# Patient Record
Sex: Female | Born: 1940 | Race: White | Hispanic: No
Health system: Southern US, Community
[De-identification: ages and names within clinical notes are randomized; demographics above are authoritative.]

## PROBLEM LIST (undated history)

## (undated) DIAGNOSIS — I82409 Acute embolism and thrombosis of unspecified deep veins of unspecified lower extremity: Secondary | ICD-10-CM

## (undated) DIAGNOSIS — K219 Gastro-esophageal reflux disease without esophagitis: Secondary | ICD-10-CM

## (undated) DIAGNOSIS — F32A Depression, unspecified: Secondary | ICD-10-CM

## (undated) DIAGNOSIS — R413 Other amnesia: Secondary | ICD-10-CM

## (undated) DIAGNOSIS — R0600 Dyspnea, unspecified: Secondary | ICD-10-CM

## (undated) DIAGNOSIS — I4891 Unspecified atrial fibrillation: Secondary | ICD-10-CM

## (undated) DIAGNOSIS — Z8679 Personal history of other diseases of the circulatory system: Secondary | ICD-10-CM

## (undated) DIAGNOSIS — K08109 Complete loss of teeth, unspecified cause, unspecified class: Secondary | ICD-10-CM

## (undated) DIAGNOSIS — E041 Nontoxic single thyroid nodule: Secondary | ICD-10-CM

## (undated) DIAGNOSIS — E7800 Pure hypercholesterolemia, unspecified: Secondary | ICD-10-CM

## (undated) DIAGNOSIS — S3012XA Contusion of groin, initial encounter: Secondary | ICD-10-CM

## (undated) DIAGNOSIS — S301XXA Contusion of abdominal wall, initial encounter: Secondary | ICD-10-CM

## (undated) DIAGNOSIS — Z9889 Other specified postprocedural states: Secondary | ICD-10-CM

## (undated) DIAGNOSIS — Z974 Presence of external hearing-aid: Secondary | ICD-10-CM

## (undated) DIAGNOSIS — M1990 Unspecified osteoarthritis, unspecified site: Secondary | ICD-10-CM

## (undated) DIAGNOSIS — Z8673 Personal history of transient ischemic attack (TIA), and cerebral infarction without residual deficits: Secondary | ICD-10-CM

## (undated) DIAGNOSIS — G3184 Mild cognitive impairment, so stated: Secondary | ICD-10-CM

## (undated) DIAGNOSIS — D62 Acute posthemorrhagic anemia: Secondary | ICD-10-CM

## (undated) DIAGNOSIS — I639 Cerebral infarction, unspecified: Secondary | ICD-10-CM

## (undated) DIAGNOSIS — H269 Unspecified cataract: Secondary | ICD-10-CM

## (undated) DIAGNOSIS — I480 Paroxysmal atrial fibrillation: Secondary | ICD-10-CM

## (undated) DIAGNOSIS — N183 Chronic kidney disease, stage 3 unspecified: Secondary | ICD-10-CM

## (undated) DIAGNOSIS — F0390 Unspecified dementia without behavioral disturbance: Secondary | ICD-10-CM

## (undated) DIAGNOSIS — R0602 Shortness of breath: Secondary | ICD-10-CM

## (undated) DIAGNOSIS — I6602 Occlusion and stenosis of left middle cerebral artery: Secondary | ICD-10-CM

## (undated) DIAGNOSIS — I491 Atrial premature depolarization: Secondary | ICD-10-CM

## (undated) DIAGNOSIS — C50919 Malignant neoplasm of unspecified site of unspecified female breast: Secondary | ICD-10-CM

## (undated) DIAGNOSIS — Z952 Presence of prosthetic heart valve: Principal | ICD-10-CM

## (undated) DIAGNOSIS — F329 Major depressive disorder, single episode, unspecified: Secondary | ICD-10-CM

## (undated) DIAGNOSIS — C801 Malignant (primary) neoplasm, unspecified: Secondary | ICD-10-CM

## (undated) DIAGNOSIS — F419 Anxiety disorder, unspecified: Secondary | ICD-10-CM

## (undated) DIAGNOSIS — I129 Hypertensive chronic kidney disease with stage 1 through stage 4 chronic kidney disease, or unspecified chronic kidney disease: Secondary | ICD-10-CM

## (undated) DIAGNOSIS — I499 Cardiac arrhythmia, unspecified: Secondary | ICD-10-CM

## (undated) DIAGNOSIS — Z973 Presence of spectacles and contact lenses: Secondary | ICD-10-CM

## (undated) DIAGNOSIS — J189 Pneumonia, unspecified organism: Secondary | ICD-10-CM

## (undated) DIAGNOSIS — I10 Essential (primary) hypertension: Secondary | ICD-10-CM

## (undated) DIAGNOSIS — Z972 Presence of dental prosthetic device (complete) (partial): Secondary | ICD-10-CM

## (undated) DIAGNOSIS — I2510 Atherosclerotic heart disease of native coronary artery without angina pectoris: Secondary | ICD-10-CM

## (undated) DIAGNOSIS — I071 Rheumatic tricuspid insufficiency: Secondary | ICD-10-CM

## (undated) DIAGNOSIS — I340 Nonrheumatic mitral (valve) insufficiency: Secondary | ICD-10-CM

## (undated) DIAGNOSIS — R112 Nausea with vomiting, unspecified: Secondary | ICD-10-CM

## (undated) HISTORY — DX: Acute embolism and thrombosis of unspecified deep veins of unspecified lower extremity: I82.409

## (undated) HISTORY — DX: Atrial premature depolarization: I49.1

## (undated) HISTORY — DX: Gastro-esophageal reflux disease without esophagitis: K21.9

## (undated) HISTORY — DX: Rheumatic tricuspid insufficiency: I07.1

## (undated) HISTORY — DX: Nonrheumatic mitral (valve) insufficiency: I34.0

## (undated) HISTORY — PX: CATARACT EXTRACTION W/ INTRAOCULAR LENS  IMPLANT, BILATERAL: SHX1307

## (undated) HISTORY — DX: Paroxysmal atrial fibrillation: I48.0

## (undated) HISTORY — PX: DILATION AND CURETTAGE OF UTERUS: SHX78

## (undated) HISTORY — DX: Unspecified cataract: H26.9

## (undated) HISTORY — DX: Unspecified atrial fibrillation: I48.91

## (undated) HISTORY — DX: Pure hypercholesterolemia, unspecified: E78.00

## (undated) HISTORY — DX: Chronic kidney disease, stage 3 unspecified: N18.30

## (undated) HISTORY — DX: Mild cognitive impairment of uncertain or unknown etiology: G31.84

## (undated) HISTORY — DX: Cerebral infarction, unspecified: I63.9

## (undated) HISTORY — DX: Contusion of groin, initial encounter: S30.12XA

## (undated) HISTORY — PX: TUBAL LIGATION: SHX77

## (undated) HISTORY — DX: Atherosclerotic heart disease of native coronary artery without angina pectoris: I25.10

## (undated) HISTORY — DX: Hypertensive chronic kidney disease with stage 1 through stage 4 chronic kidney disease, or unspecified chronic kidney disease: I12.9

## (undated) HISTORY — DX: Other amnesia: R41.3

## (undated) HISTORY — DX: Contusion of abdominal wall, initial encounter: S30.1XXA

## (undated) HISTORY — PX: MULTIPLE TOOTH EXTRACTIONS: SHX2053

---
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
## 1980-03-28 HISTORY — PX: MASTECTOMY: SHX3

## 1999-05-03 ENCOUNTER — Other Ambulatory Visit: Admission: RE | Admit: 1999-05-03 | Discharge: 1999-05-03 | Payer: Self-pay | Admitting: *Deleted

## 2000-07-21 ENCOUNTER — Encounter: Payer: Self-pay | Admitting: Emergency Medicine

## 2000-07-21 ENCOUNTER — Emergency Department (HOSPITAL_COMMUNITY): Admission: EM | Admit: 2000-07-21 | Discharge: 2000-07-22 | Payer: Self-pay | Admitting: Emergency Medicine

## 2000-11-06 ENCOUNTER — Other Ambulatory Visit: Admission: RE | Admit: 2000-11-06 | Discharge: 2000-11-06 | Payer: Self-pay | Admitting: *Deleted

## 2001-01-15 ENCOUNTER — Encounter (INDEPENDENT_AMBULATORY_CARE_PROVIDER_SITE_OTHER): Payer: Self-pay

## 2001-01-15 ENCOUNTER — Ambulatory Visit (HOSPITAL_COMMUNITY): Admission: RE | Admit: 2001-01-15 | Discharge: 2001-01-15 | Payer: Self-pay | Admitting: *Deleted

## 2005-02-27 ENCOUNTER — Emergency Department (HOSPITAL_COMMUNITY): Admission: EM | Admit: 2005-02-27 | Discharge: 2005-02-27 | Payer: Self-pay | Admitting: Emergency Medicine

## 2007-05-18 ENCOUNTER — Encounter (INDEPENDENT_AMBULATORY_CARE_PROVIDER_SITE_OTHER): Payer: Self-pay | Admitting: Internal Medicine

## 2007-05-18 ENCOUNTER — Ambulatory Visit: Admission: RE | Admit: 2007-05-18 | Discharge: 2007-05-18 | Payer: Self-pay | Admitting: Internal Medicine

## 2007-05-18 ENCOUNTER — Ambulatory Visit: Payer: Self-pay | Admitting: Surgery

## 2010-08-13 NOTE — Procedures (Signed)
[facility]  Patient:    Rachel Santos, Rachel Santos Visit Number: [id number] MRN: [id number]          Service Type: END Location: ENDO Attending Physician:  Sabino Gasser Proc. Date: 01/15/01 Admit Date:  01/15/2001                             Procedure Report  PROCEDURE:  Upper endoscopy.  INDICATION:  Gastroesophageal reflux disease.  ANESTHESIA:  Demerol 50, Versed 5 mg.  DESCRIPTION OF PROCEDURE:  With patient mildly sedated in the left lateral decubitus position, the Olympus videoscopic endoscope was inserted into the mouth and passed under direct vision through the esophagus.  The distal esophagus appeared to be somewhat inflamed, photographed and biopsied.  We entered into the stomach.  The fundus, body, antrum, duodenal bulb, and second portion of duodenum all appeared normal.  From this point, the endoscope was slowly withdrawn, taking circumferential views of the entire duodenal mucosa until the endoscope then pulled back into the stomach and placed in retroflexion to view the stomach from below.  The endoscope was then straightened and withdrawn, taking circumferential views of the remaining gastric and esophageal mucosa.  The patients vital signs and pulse oximeter remained stable.  The patient tolerated the procedure well without apparent complications.  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  Await biopsy report.  The patient will call me for results and follow up with me as an outpatient. Proceed to colonoscopy. Attending Physician:  Sabino Gasser DD:  01/15/01 TD:  01/16/01 Job: [id number]

## 2010-08-13 NOTE — Procedures (Signed)
[facility]  Patient:    Rachel Santos, Rachel Santos Visit Number: [id number] MRN: [id number]          Service Type: END Location: ENDO Attending Physician:  Sabino Gasser Proc. Date: 01/15/01 Admit Date:  01/15/2001                             Procedure Report  PROCEDURE:  Colonoscopy.  ANESTHESIA:  Demerol 20, Versed 2 mg.  INDICATIONS:  Ulcerative colitis.  DESCRIPTION OF PROCEDURE:  With the patient mildly sedated in the left lateral decubitus position, the Olympus videoscopic colonoscope was inserted into the rectum and passed under direct vision to the cecum, identified by the ileocecal valve and appendiceal orifice.  We entered into the terminal ileum which also appeared normal.  From this point, the endoscope was slowly withdrawn, taking circumferential views of the entire colonic mucosa, stopping in the rectum which appeared normal on direct view and showed hemorrhoids on retroflex view.  The endoscope was straightened and withdrawn.  The patients vital signs and pulse oximeter remained stable.  The patient tolerated the procedure well without apparent complications.  FINDINGS:  Internal hemorrhoids, otherwise unremarkable colonoscopic examination to the cecum, other than the hemorrhoids.  PLAN:  Follow up with me.  See endoscopy note for further details. Attending Physician:  Sabino Gasser DD:  01/15/01 TD:  01/16/01 Job: [id number]

## 2011-08-09 ENCOUNTER — Encounter (HOSPITAL_COMMUNITY): Payer: Self-pay | Admitting: *Deleted

## 2011-08-09 ENCOUNTER — Emergency Department (HOSPITAL_COMMUNITY): Payer: No Typology Code available for payment source

## 2011-08-09 ENCOUNTER — Emergency Department (HOSPITAL_COMMUNITY)
Admission: EM | Admit: 2011-08-09 | Discharge: 2011-08-09 | Disposition: A | Discharge status: Home Health | Payer: No Typology Code available for payment source | Attending: Emergency Medicine | Admitting: Emergency Medicine

## 2011-08-09 DIAGNOSIS — M129 Arthropathy, unspecified: Secondary | ICD-10-CM | POA: Insufficient documentation

## 2011-08-09 DIAGNOSIS — E119 Type 2 diabetes mellitus without complications: Secondary | ICD-10-CM | POA: Insufficient documentation

## 2011-08-09 DIAGNOSIS — I10 Essential (primary) hypertension: Secondary | ICD-10-CM | POA: Insufficient documentation

## 2011-08-09 DIAGNOSIS — R51 Headache: Secondary | ICD-10-CM | POA: Insufficient documentation

## 2011-08-09 DIAGNOSIS — F29 Unspecified psychosis not due to a substance or known physiological condition: Secondary | ICD-10-CM | POA: Insufficient documentation

## 2011-08-09 HISTORY — DX: Unspecified osteoarthritis, unspecified site: M19.90

## 2011-08-09 HISTORY — DX: Essential (primary) hypertension: I10

## 2011-08-09 LAB — GLUCOSE, CAPILLARY: Glucose-Capillary: 141 mg/dL — ABNORMAL HIGH (ref 70–99)
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
## 2012-06-28 ENCOUNTER — Other Ambulatory Visit (HOSPITAL_COMMUNITY): Payer: Self-pay | Admitting: Internal Medicine

## 2012-06-28 DIAGNOSIS — I4891 Unspecified atrial fibrillation: Secondary | ICD-10-CM

## 2012-06-29 ENCOUNTER — Ambulatory Visit (HOSPITAL_COMMUNITY): Payer: Medicare Other | Attending: Cardiology

## 2012-06-29 DIAGNOSIS — G459 Transient cerebral ischemic attack, unspecified: Secondary | ICD-10-CM

## 2012-06-29 DIAGNOSIS — Z8673 Personal history of transient ischemic attack (TIA), and cerebral infarction without residual deficits: Secondary | ICD-10-CM | POA: Insufficient documentation

## 2012-06-29 DIAGNOSIS — I4891 Unspecified atrial fibrillation: Secondary | ICD-10-CM

## 2012-06-29 NOTE — Progress Notes (Signed)
Echocardiogram performed.  

## 2012-07-02 ENCOUNTER — Other Ambulatory Visit (HOSPITAL_COMMUNITY): Payer: Medicare Other

## 2012-07-02 ENCOUNTER — Encounter: Payer: Self-pay | Admitting: Cardiovascular Disease

## 2012-07-02 ENCOUNTER — Encounter (HOSPITAL_COMMUNITY): Payer: Self-pay | Admitting: Internal Medicine

## 2012-07-03 ENCOUNTER — Encounter: Payer: Self-pay | Admitting: Cardiovascular Disease

## 2012-07-03 ENCOUNTER — Ambulatory Visit (INDEPENDENT_AMBULATORY_CARE_PROVIDER_SITE_OTHER): Payer: Medicare Other | Admitting: Cardiovascular Disease

## 2012-07-03 VITALS — BP 140/80 | HR 79 | Ht 66.0 in | Wt 174.0 lb

## 2012-07-03 DIAGNOSIS — I340 Nonrheumatic mitral (valve) insufficiency: Secondary | ICD-10-CM | POA: Insufficient documentation

## 2012-07-03 DIAGNOSIS — I059 Rheumatic mitral valve disease, unspecified: Secondary | ICD-10-CM

## 2012-07-03 DIAGNOSIS — I480 Paroxysmal atrial fibrillation: Secondary | ICD-10-CM | POA: Insufficient documentation

## 2012-07-03 DIAGNOSIS — I4891 Unspecified atrial fibrillation: Secondary | ICD-10-CM

## 2012-07-03 NOTE — Patient Instructions (Addendum)
You have a follow up appt scheduled with Dr. Clifton James on July 17, 2012 at 2:00

## 2012-07-03 NOTE — Progress Notes (Signed)
History of Present Illness: 72 yo female with history of DM, HTN, OA, atrial fibrillation, GERD, cataracts,  who is here today as a new patient for evaluation of atrial fibrillation and severe MR by echo yesterday. She was diagnosed with atrial fibrillation on 06/27/12 in primary care and was started on Metoprolol 50 mg po BID for rate control and Xarelto 20 mg po Qdaily. Metoprolol has been reduced to 25 mg po BID. Echo yesterday with normal LV size and function, LVEF   Primary Care Physician: Dr. Nicholos [facility])  Past Medical History  Diagnosis Date  . Diabetes mellitus   . Arthritis   . Hypertension   . Atrial fibrillation   . Mitral regurgitation   . GERD (gastroesophageal reflux disease)   . Cataracts, bilateral     Past Surgical History  Procedure Laterality Date  . Mastectomy Left 1982  . Cesarean section      x 2    Current Outpatient Prescriptions  Medication Sig Dispense Refill  . calcium carbonate 200 MG capsule Calcium citrate  /vitamin D 630mg  (calicum)  1 tab daily      . glipiZIDE (GLUCOTROL XL) 10 MG 24 hr tablet 1 tab daily      . Lancets 28G MISC Lancets and test strips      . LEVEMIR FLEXPEN 100 UNIT/ML injection As directed      . losartan (COZAAR) 50 MG tablet 1 tab daily      . lovastatin (MEVACOR) 40 MG tablet 1 tab daily      . meloxicam (MOBIC) 7.5 MG tablet Take 1 tab daily      . metFORMIN (GLUCOPHAGE) 500 MG tablet Take 2 tab twice a day      . metoprolol (LOPRESSOR) 50 MG tablet Take 1/2 tab bid      . Multiple Vitamin (MULTIVITAMIN) tablet Take 1 tablet by mouth daily.      Marland Kitchen omeprazole (PRILOSEC) 40 MG capsule 1 tab daily      . PARoxetine (PAXIL) 30 MG tablet 1 tab daily      . Rivaroxaban (XARELTO) 20 MG TABS Take by mouth daily.       No current facility-administered medications for this visit.    No Known Allergies  History   Social History  . Marital Status: Widowed    Spouse Name: N/A    Number of  Children: 3  . Years of Education: N/A   Occupational History  . Retired-[employer]    Social History Main Topics  . Smoking status: Never Smoker   . Smokeless tobacco: Not on file  . Alcohol Use: No  . Drug Use: No  . Sexually Active: No   Other Topics Concern  . Not on file   Social History Narrative  . No narrative on file    Family History  Problem Relation Age of Onset  . Cancer Father   . Heart attack Paternal Grandfather     Review of Systems:  As stated in the HPI and otherwise negative.   BP 140/80  Pulse 79  Ht 5\' 6"  (1.676 m)  Wt 174 lb (78.926 kg)  BMI 28.1 kg/m2  Physical Examination: General: Well developed, well nourished, NAD HEENT: OP clear, mucus membranes moist SKIN: warm, dry. No rashes. Neuro: No focal deficits Musculoskeletal: Muscle strength 5/5 all ext Psychiatric: Mood and affect normal Neck: No JVD, no carotid bruits, no thyromegaly, no lymphadenopathy. Lungs:Clear bilaterally, no wheezes, rhonci, crackles Cardiovascular: Irregular. Systolic  murmur. No gallops or rubs. Abdomen:Soft. Bowel sounds present. Non-tender.  Extremities: No lower extremity edema. Pulses are 2 + in the bilateral DP/PT.  EKG: Atrial fibrillation, rate 79 bpm.   Echo 07/02/12: Left ventricle: The cavity size was normal. Wall thickness was increased in a pattern of mild LVH. Systolic function was normal. The estimated ejection fraction was in the range of 55% to 60%. Wall motion was normal; there were no regional wall motion abnormalities. - Mitral valve: Calcified annulus. Severe regurgitation. - Left atrium: The atrium was moderately dilated. - Right atrium: The atrium was mildly dilated. - Tricuspid valve: Moderate regurgitation. - Pulmonary arteries: Systolic pressure was mildly increased. PA peak pressure: 40mm Hg (S).  Assessment and Plan:   1. Atrial fibrillation: Rate is controlled. Will continue Metoprolol 25 mg po BID. Will  continue Xarelto 20 mg po Qdaily for anti-coagulation. She has some fatigue but this is better with reduced dose of metoprolol. Will need 3 more weeks of anti-coagulation then I will see her back and discuss DCCV. She may not be able to maintain NSR with biatrial enlargement.   2. Severe Mitral valve regurgitation: Severe by echo yesterday. Her LV size is normal. Some of her fatigue may be related to this but hard to say with her new atrial fibrillation. If we can get her back in sinus but her fatigue persists, will need to consider cath/TEE and surgical referral.

## 2012-07-05 ENCOUNTER — Encounter (INDEPENDENT_AMBULATORY_CARE_PROVIDER_SITE_OTHER): Payer: Medicare Other

## 2012-07-05 DIAGNOSIS — H53129 Transient visual loss, unspecified eye: Secondary | ICD-10-CM

## 2012-07-05 DIAGNOSIS — I6529 Occlusion and stenosis of unspecified carotid artery: Secondary | ICD-10-CM

## 2012-07-05 DIAGNOSIS — H53123 Transient visual loss, bilateral: Secondary | ICD-10-CM

## 2012-07-10 ENCOUNTER — Telehealth: Payer: Self-pay | Admitting: Cardiovascular Disease

## 2012-07-10 NOTE — Telephone Encounter (Signed)
Spoke with pt's daughter and appt made for pt to see Dr. Clifton James on Jul 26, 2012 at 11:30

## 2012-07-10 NOTE — Telephone Encounter (Signed)
New prble   Patient was schedule on 4/22 . However r.s to 5/28 . Daughter called back in to see if appt can be move up sooner.

## 2012-07-17 ENCOUNTER — Ambulatory Visit: Payer: Medicare Other | Admitting: Cardiovascular Disease

## 2012-07-26 ENCOUNTER — Ambulatory Visit (INDEPENDENT_AMBULATORY_CARE_PROVIDER_SITE_OTHER): Payer: Medicare Other | Admitting: Cardiovascular Disease

## 2012-07-26 ENCOUNTER — Encounter: Payer: Self-pay | Admitting: Cardiovascular Disease

## 2012-07-26 ENCOUNTER — Encounter: Payer: Self-pay | Admitting: *Deleted

## 2012-07-26 VITALS — BP 134/84 | HR 81 | Ht 66.0 in | Wt 172.8 lb

## 2012-07-26 DIAGNOSIS — I4891 Unspecified atrial fibrillation: Secondary | ICD-10-CM

## 2012-07-26 DIAGNOSIS — I059 Rheumatic mitral valve disease, unspecified: Secondary | ICD-10-CM

## 2012-07-26 DIAGNOSIS — I340 Nonrheumatic mitral (valve) insufficiency: Secondary | ICD-10-CM

## 2012-07-26 LAB — CBC WITH DIFFERENTIAL/PLATELET
Basophils Relative: 0.3 % (ref 0.0–3.0)
Hemoglobin: 11.8 g/dL — ABNORMAL LOW (ref 12.0–15.0)
Lymphocytes Relative: 32.5 % (ref 12.0–46.0)
MCHC: 33.4 g/dL (ref 30.0–36.0)
Monocytes Relative: 9.8 % (ref 3.0–12.0)
Neutro Abs: 4.4 10*3/uL (ref 1.4–7.7)
RBC: 4.14 Mil/uL (ref 3.87–5.11)

## 2012-07-26 LAB — BASIC METABOLIC PANEL
BUN: 24 mg/dL — ABNORMAL HIGH (ref 6–23)
CO2: 24 mEq/L (ref 19–32)
Chloride: 103 mEq/L (ref 96–112)
Creatinine, Ser: 0.9 mg/dL (ref 0.4–1.2)
Glucose, Bld: 166 mg/dL — ABNORMAL HIGH (ref 70–99)

## 2012-07-26 NOTE — Patient Instructions (Addendum)
Your physician recommends that you schedule a follow-up appointment in:  2 weeks.   Your physician has requested that you have a TEE/Cardioversion. During a TEE, sound waves are used to create images of your heart. It provides your doctor with information about the size and shape of your heart and how well your heart's chambers and valves are working. In this test, a transducer is attached to the end of a flexible tube that is guided down you throat and into your esophagus (the tube leading from your mouth to your stomach) to get a more detailed image of your heart. Once the TEE has determined that a blood clot is not present, the cardioversion begins. Electrical Cardioversion uses a jolt of electricity to your heart either through paddles or wired patches attached to your chest. This is a controlled, usually prescheduled, procedure. This procedure is done at the hospital and you are not awake during the procedure. You usually go home the day of the procedure. Please see the instruction sheet given to you today for more information. Scheduled for Jul 31, 2012.

## 2012-07-26 NOTE — Progress Notes (Addendum)
 History of Present Illness: 72 yo female with history of DM, HTN, OA, atrial fibrillation, GERD, cataracts, who is here today for cardiac follow up. I saw her as a new patient for evaluation of atrial fibrillation and severe MR on 07/03/12.  She was diagnosed with atrial fibrillation on 06/27/12 in primary care and was started on Metoprolol 50 mg po BID for rate control and Xarelto 20 mg po Qdaily.  Echo with normal LV size and function severe MR, moderate TR. We planned on 3 more weeks of anti-coagulation before consideration for a cardioversion.   She is here today for follow up. She has had some discomfort in her chest. No exertional chest pain. No dyspnea. She does note a cough. Non-productive. She is not aware of her heart racing.   Primary Care Physician: Dr. Ramachandran ([facility])  Past Medical History  Diagnosis Date  . Diabetes mellitus   . Arthritis   . Hypertension   . Atrial fibrillation   . Mitral regurgitation   . GERD (gastroesophageal reflux disease)   . Cataracts, bilateral     Past Surgical History  Procedure Laterality Date  . Mastectomy Left 1982  . Cesarean section      x 2    Current Outpatient Prescriptions  Medication Sig Dispense Refill  . calcium carbonate 200 MG capsule Calcium citrate  /vitamin D 630mg (calicum)  1 tab daily      . glipiZIDE (GLUCOTROL XL) 10 MG 24 hr tablet 1 tab daily      . Lancets 28G MISC Lancets and test strips      . LEVEMIR FLEXPEN 100 UNIT/ML injection As directed      . losartan (COZAAR) 50 MG tablet 1 tab daily      . lovastatin (MEVACOR) 40 MG tablet 1 tab daily      . meloxicam (MOBIC) 7.5 MG tablet Take 1 tab daily      . metFORMIN (GLUCOPHAGE) 500 MG tablet Take 2 tab twice a day      . metoprolol (LOPRESSOR) 50 MG tablet Take 1/2 tab bid      . Multiple Vitamin (MULTIVITAMIN) tablet Take 1 tablet by mouth daily.      . omeprazole (PRILOSEC) 40 MG capsule 1 tab daily      . PARoxetine (PAXIL) 30 MG  tablet 1 tab daily      . Rivaroxaban (XARELTO) 20 MG TABS Take by mouth daily.       No current facility-administered medications for this visit.    No Known Allergies  History   Social History  . Marital Status: Widowed    Spouse Name: N/A    Number of Children: 3  . Years of Education: N/A   Occupational History  . Retired-[employer]    Social History Main Topics  . Smoking status: Never Smoker   . Smokeless tobacco: Not on file  . Alcohol Use: No  . Drug Use: No  . Sexually Active: No   Other Topics Concern  . Not on file   Social History Narrative  . No narrative on file    Family History  Problem Relation Age of Onset  . Cancer Father   . Heart attack Paternal Grandfather     Review of Systems:  As stated in the HPI and otherwise negative.   BP 134/84  Pulse 81  Ht 5' 6" (1.676 m)  Wt 172 lb 12.8 oz (78.382 kg)  BMI 27.9 kg/m2  SpO2   98%  Physical Examination: General: Well developed, well nourished, NAD HEENT: OP clear, mucus membranes moist SKIN: warm, dry. No rashes. Neuro: No focal deficits Musculoskeletal: Muscle strength 5/5 all ext Psychiatric: Mood and affect normal Neck: No JVD, no carotid bruits, no thyromegaly, no lymphadenopathy. Lungs:Clear bilaterally, no wheezes, rhonci, crackles Cardiovascular: Regular rate and rhythm. No murmurs, gallops or rubs. Abdomen:Soft. Bowel sounds present. Non-tender.  Extremities: No lower extremity edema. Pulses are 2 + in the bilateral DP/PT.  EKG: Atrial fibrillation, rate 81 bpm. Septal infarct.   Echo 07/02/12:  Left ventricle: The cavity size was normal. Wall thickness was increased in a pattern of mild LVH. Systolic function was normal. The estimated ejection fraction was in the range of 55% to 60%. Wall motion was normal; there were no regional wall motion abnormalities. - Mitral valve: Calcified annulus. Severe regurgitation. - Left atrium: The atrium was moderately  dilated. - Right atrium: The atrium was mildly dilated. - Tricuspid valve: Moderate regurgitation. - Pulmonary arteries: Systolic pressure was mildly increased. PA peak pressure: 40mm Hg (S).  Assessment and Plan:   1. Atrial fibrillation: Rate is controlled. Will continue Metoprolol 25 mg po BID. Will continue Xarelto 20 mg po Qdaily for anti-coagulation. Will plan TEE guided cardioversion as she will need the TEE for assessment of her mitral valve to better evaluate mechanism of her severe MR.  She may not be able to maintain NSR with biatrial enlargement but will give DCCV a try. BMET and CBC today.   2. Severe Mitral valve regurgitation: Severe by echo April 2014. Her LV size and function is normal. Some of her fatigue may be related to this but hard to say with her new atrial fibrillation. Will plan TEE as above. Based on TEE findings, she may need consideration for MV surgery. Will delay right and left heart cath for one month after her cardioversion since we will need to hold her Xarelto for the cath.  

## 2012-07-26 NOTE — Addendum Note (Signed)
Addended by: Verne Carrow D on: 07/26/2012 01:42 PM   Modules accepted: Orders

## 2012-07-31 ENCOUNTER — Ambulatory Visit (HOSPITAL_COMMUNITY): Payer: Medicare Other | Admitting: *Deleted

## 2012-07-31 ENCOUNTER — Ambulatory Visit (HOSPITAL_COMMUNITY)
Admission: RE | Admit: 2012-07-31 | Discharge: 2012-07-31 | Disposition: A | Discharge status: Home / Self Care | Payer: Medicare Other | Source: Ambulatory Visit | Attending: Cardiovascular Disease | Admitting: Cardiovascular Disease

## 2012-07-31 ENCOUNTER — Encounter (HOSPITAL_COMMUNITY): Payer: Self-pay | Admitting: *Deleted

## 2012-07-31 ENCOUNTER — Encounter (HOSPITAL_COMMUNITY): Payer: Self-pay | Admitting: Gastroenterology

## 2012-07-31 ENCOUNTER — Encounter (HOSPITAL_COMMUNITY)
Admission: RE | Disposition: A | Discharge status: Home / Self Care | Payer: Self-pay | Source: Ambulatory Visit | Attending: Cardiovascular Disease

## 2012-07-31 DIAGNOSIS — H269 Unspecified cataract: Secondary | ICD-10-CM | POA: Insufficient documentation

## 2012-07-31 DIAGNOSIS — I059 Rheumatic mitral valve disease, unspecified: Secondary | ICD-10-CM | POA: Insufficient documentation

## 2012-07-31 DIAGNOSIS — M129 Arthropathy, unspecified: Secondary | ICD-10-CM | POA: Insufficient documentation

## 2012-07-31 DIAGNOSIS — Z9010 Acquired absence of unspecified breast and nipple: Secondary | ICD-10-CM | POA: Insufficient documentation

## 2012-07-31 DIAGNOSIS — Z791 Long term (current) use of non-steroidal anti-inflammatories (NSAID): Secondary | ICD-10-CM | POA: Insufficient documentation

## 2012-07-31 DIAGNOSIS — I340 Nonrheumatic mitral (valve) insufficiency: Secondary | ICD-10-CM

## 2012-07-31 DIAGNOSIS — I10 Essential (primary) hypertension: Secondary | ICD-10-CM | POA: Insufficient documentation

## 2012-07-31 DIAGNOSIS — I4891 Unspecified atrial fibrillation: Secondary | ICD-10-CM

## 2012-07-31 DIAGNOSIS — Z7901 Long term (current) use of anticoagulants: Secondary | ICD-10-CM | POA: Insufficient documentation

## 2012-07-31 DIAGNOSIS — E119 Type 2 diabetes mellitus without complications: Secondary | ICD-10-CM | POA: Insufficient documentation

## 2012-07-31 DIAGNOSIS — I517 Cardiomegaly: Secondary | ICD-10-CM | POA: Insufficient documentation

## 2012-07-31 DIAGNOSIS — Z794 Long term (current) use of insulin: Secondary | ICD-10-CM | POA: Insufficient documentation

## 2012-07-31 DIAGNOSIS — K219 Gastro-esophageal reflux disease without esophagitis: Secondary | ICD-10-CM | POA: Insufficient documentation

## 2012-07-31 HISTORY — PX: TEE WITHOUT CARDIOVERSION: SHX5443

## 2012-07-31 HISTORY — PX: CARDIOVERSION: SHX1299

## 2012-07-31 HISTORY — DX: Other specified postprocedural states: R11.2

## 2012-07-31 HISTORY — DX: Other specified postprocedural states: Z98.890

## 2012-07-31 LAB — GLUCOSE, CAPILLARY: Glucose-Capillary: 99 mg/dL (ref 70–99)

## 2012-07-31 SURGERY — ECHOCARDIOGRAM, TRANSESOPHAGEAL
Anesthesia: Moderate Sedation

## 2012-07-31 MED ORDER — FENTANYL CITRATE 0.05 MG/ML IJ SOLN
INTRAMUSCULAR | Status: DC | PRN
  Administered 2012-07-31: 25 ug via INTRAVENOUS

## 2012-07-31 MED ORDER — FENTANYL CITRATE 0.05 MG/ML IJ SOLN
INTRAMUSCULAR | Status: AC
  Filled 2012-07-31: qty 2

## 2012-07-31 MED ORDER — MIDAZOLAM HCL 5 MG/ML IJ SOLN
INTRAMUSCULAR | Status: AC
  Filled 2012-07-31: qty 2

## 2012-07-31 MED ORDER — PROPOFOL 10 MG/ML IV BOLUS
INTRAVENOUS | Status: DC | PRN
  Administered 2012-07-31: 50 mg via INTRAVENOUS

## 2012-07-31 MED ORDER — MIDAZOLAM HCL 10 MG/2ML IJ SOLN
INTRAMUSCULAR | Status: DC | PRN
  Administered 2012-07-31: 1 mg via INTRAVENOUS
  Administered 2012-07-31: 2 mg via INTRAVENOUS

## 2012-07-31 MED ORDER — BUTAMBEN-TETRACAINE-BENZOCAINE 2-2-14 % EX AERO
INHALATION_SPRAY | CUTANEOUS | Status: DC | PRN
  Administered 2012-07-31: 2 via TOPICAL

## 2012-07-31 MED ORDER — SODIUM CHLORIDE 0.9 % IV SOLN
INTRAVENOUS | Status: DC
  Administered 2012-07-31: 500 mL via INTRAVENOUS

## 2012-07-31 MED ORDER — LIDOCAINE HCL (CARDIAC) 20 MG/ML IV SOLN
INTRAVENOUS | Status: DC | PRN
  Administered 2012-07-31: 30 mg via INTRAVENOUS

## 2012-07-31 NOTE — Transfer of Care (Signed)
Immediate Anesthesia Transfer of Care Note  Patient: Rachel Santos  Procedure(s) Performed: Procedure(s): TRANSESOPHAGEAL ECHOCARDIOGRAM (TEE) (N/A) CARDIOVERSION (N/A)  Patient Location: Endoscopy Unit  Anesthesia Type:MAC  Level of Consciousness: awake  Airway & Oxygen Therapy: Patient Spontanous Breathing and Patient connected to nasal cannula oxygen  Post-op Assessment: Report given to PACU RN and Post -op Vital signs reviewed and stable  Post vital signs: Reviewed and stable  Complications: No apparent anesthesia complications

## 2012-07-31 NOTE — Progress Notes (Signed)
  Echocardiogram Echocardiogram Transesophageal has been performed.  Rachel Santos 07/31/2012, 2:08 PM

## 2012-07-31 NOTE — Anesthesia Preprocedure Evaluation (Addendum)
Anesthesia Evaluation  Patient identified by MRN, date of birth, ID band Patient awake    Reviewed: Allergy & Precautions, H&P , NPO status , Patient's Chart, lab work & pertinent test results, reviewed documented beta blocker date and time   History of Anesthesia Complications (+) PONV  Airway Mallampati: II TM Distance: >3 FB Neck ROM: Full    Dental  (+) Edentulous Upper and Edentulous Lower   Pulmonary          Cardiovascular hypertension, + dysrhythmias Atrial Fibrillation     Neuro/Psych    GI/Hepatic GERD-  Medicated,  Endo/Other  diabetes  Renal/GU      Musculoskeletal   Abdominal   Peds  Hematology   Anesthesia Other Findings   Reproductive/Obstetrics                          Anesthesia Physical Anesthesia Plan  ASA: III  Anesthesia Plan: MAC   Post-op Pain Management:    Induction:   Airway Management Planned: Mask  Additional Equipment:   Intra-op Plan:   Post-operative Plan:   Informed Consent: I have reviewed the patients History and Physical, chart, labs and discussed the procedure including the risks, benefits and alternatives for the proposed anesthesia with the patient or authorized representative who has indicated his/her understanding and acceptance.   Dental advisory given  Plan Discussed with: CRNA, Anesthesiologist and Surgeon  Anesthesia Plan Comments:         Anesthesia Quick Evaluation

## 2012-07-31 NOTE — H&P (View-Only) (Signed)
History of Present Illness: 72 yo female with history of DM, HTN, OA, atrial fibrillation, GERD, cataracts, who is here today for cardiac follow up. I saw her as a new patient for evaluation of atrial fibrillation and severe MR on 07/03/12.  She was diagnosed with atrial fibrillation on 06/27/12 in primary care and was started on Metoprolol 50 mg po BID for rate control and Xarelto 20 mg po Qdaily.  Echo with normal LV size and function severe MR, moderate TR. We planned on 3 more weeks of anti-coagulation before consideration for a cardioversion.   She is here today for follow up. She has had some discomfort in her chest. No exertional chest pain. No dyspnea. She does note a cough. Non-productive. She is not aware of her heart racing.   Primary Care Physician: Dr. Nicholos Johns [facility])  Past Medical History  Diagnosis Date  . Diabetes mellitus   . Arthritis   . Hypertension   . Atrial fibrillation   . Mitral regurgitation   . GERD (gastroesophageal reflux disease)   . Cataracts, bilateral     Past Surgical History  Procedure Laterality Date  . Mastectomy Left 1982  . Cesarean section      x 2    Current Outpatient Prescriptions  Medication Sig Dispense Refill  . calcium carbonate 200 MG capsule Calcium citrate  /vitamin D 630mg  (calicum)  1 tab daily      . glipiZIDE (GLUCOTROL XL) 10 MG 24 hr tablet 1 tab daily      . Lancets 28G MISC Lancets and test strips      . LEVEMIR FLEXPEN 100 UNIT/ML injection As directed      . losartan (COZAAR) 50 MG tablet 1 tab daily      . lovastatin (MEVACOR) 40 MG tablet 1 tab daily      . meloxicam (MOBIC) 7.5 MG tablet Take 1 tab daily      . metFORMIN (GLUCOPHAGE) 500 MG tablet Take 2 tab twice a day      . metoprolol (LOPRESSOR) 50 MG tablet Take 1/2 tab bid      . Multiple Vitamin (MULTIVITAMIN) tablet Take 1 tablet by mouth daily.      Marland Kitchen omeprazole (PRILOSEC) 40 MG capsule 1 tab daily      . PARoxetine (PAXIL) 30 MG  tablet 1 tab daily      . Rivaroxaban (XARELTO) 20 MG TABS Take by mouth daily.       No current facility-administered medications for this visit.    No Known Allergies  History   Social History  . Marital Status: Widowed    Spouse Name: N/A    Number of Children: 3  . Years of Education: N/A   Occupational History  . Retired-[employer]    Social History Main Topics  . Smoking status: Never Smoker   . Smokeless tobacco: Not on file  . Alcohol Use: No  . Drug Use: No  . Sexually Active: No   Other Topics Concern  . Not on file   Social History Narrative  . No narrative on file    Family History  Problem Relation Age of Onset  . Cancer Father   . Heart attack Paternal Grandfather     Review of Systems:  As stated in the HPI and otherwise negative.   BP 134/84  Pulse 81  Ht 5\' 6"  (1.676 m)  Wt 172 lb 12.8 oz (78.382 kg)  BMI 27.9 kg/m2  SpO2  98%  Physical Examination: General: Well developed, well nourished, NAD HEENT: OP clear, mucus membranes moist SKIN: warm, dry. No rashes. Neuro: No focal deficits Musculoskeletal: Muscle strength 5/5 all ext Psychiatric: Mood and affect normal Neck: No JVD, no carotid bruits, no thyromegaly, no lymphadenopathy. Lungs:Clear bilaterally, no wheezes, rhonci, crackles Cardiovascular: Regular rate and rhythm. No murmurs, gallops or rubs. Abdomen:Soft. Bowel sounds present. Non-tender.  Extremities: No lower extremity edema. Pulses are 2 + in the bilateral DP/PT.  EKG: Atrial fibrillation, rate 81 bpm. Septal infarct.   Echo 07/02/12:  Left ventricle: The cavity size was normal. Wall thickness was increased in a pattern of mild LVH. Systolic function was normal. The estimated ejection fraction was in the range of 55% to 60%. Wall motion was normal; there were no regional wall motion abnormalities. - Mitral valve: Calcified annulus. Severe regurgitation. - Left atrium: The atrium was moderately  dilated. - Right atrium: The atrium was mildly dilated. - Tricuspid valve: Moderate regurgitation. - Pulmonary arteries: Systolic pressure was mildly increased. PA peak pressure: 40mm Hg (S).  Assessment and Plan:   1. Atrial fibrillation: Rate is controlled. Will continue Metoprolol 25 mg po BID. Will continue Xarelto 20 mg po Qdaily for anti-coagulation. Will plan TEE guided cardioversion as she will need the TEE for assessment of her mitral valve to better evaluate mechanism of her severe MR.  She may not be able to maintain NSR with biatrial enlargement but will give DCCV a try. BMET and CBC today.   2. Severe Mitral valve regurgitation: Severe by echo April 2014. Her LV size and function is normal. Some of her fatigue may be related to this but hard to say with her new atrial fibrillation. Will plan TEE as above. Based on TEE findings, she may need consideration for MV surgery. Will delay right and left heart cath for one month after her cardioversion since we will need to hold her Xarelto for the cath.

## 2012-07-31 NOTE — CV Procedure (Signed)
TEE:  3 mg versed and 25 ug fentanyl  50 mg propofol for DCC  Normal EF 60% Mild bileaflet prolapse Central severe MR Severe TR Severe biatrial enlargement No LAA thrombus Moderate AV calcification Small PFO  DCC:  120 J -> 200 J converted to NSR rate 52  Has been on Rx xarelto  [employer]

## 2012-07-31 NOTE — Interval H&P Note (Signed)
History and Physical Interval Note:  07/31/2012 12:41 PM  Rachel Santos  has presented today for surgery, with the diagnosis of mitral regurgitation/a-fib  The various methods of treatment have been discussed with the patient and family. After consideration of risks, benefits and other options for treatment, the patient has consented to  Procedure(s): TRANSESOPHAGEAL ECHOCARDIOGRAM (TEE) (N/A) CARDIOVERSION (N/A) as a surgical intervention .  The patient's history has been reviewed, patient examined, no change in status, stable for surgery.  I have reviewed the patient's chart and labs.  Questions were answered to the patient's satisfaction.     Charlton Haws

## 2012-08-01 ENCOUNTER — Encounter (HOSPITAL_COMMUNITY): Payer: Self-pay | Admitting: Cardiovascular Disease

## 2012-08-02 NOTE — Anesthesia Postprocedure Evaluation (Signed)
Anesthesia Post Note  Patient: Rachel Santos  Procedure(s) Performed: Procedure(s) (LRB): TRANSESOPHAGEAL ECHOCARDIOGRAM (TEE) (N/A) CARDIOVERSION (N/A)  Anesthesia type: MAC  Patient location: PACU  Post pain: Pain level controlled and Adequate analgesia  Post assessment: Post-op Vital signs reviewed, Patient's Cardiovascular Status Stable and Respiratory Function Stable  Last Vitals:  Filed Vitals:   07/31/12 1425  BP: 111/68  Pulse:   Temp:   Resp: 19    Post vital signs: Reviewed and stable  Level of consciousness: awake, alert  and oriented  Complications: No apparent anesthesia complications

## 2012-08-16 ENCOUNTER — Encounter: Payer: Self-pay | Admitting: *Deleted

## 2012-08-16 ENCOUNTER — Ambulatory Visit (INDEPENDENT_AMBULATORY_CARE_PROVIDER_SITE_OTHER): Payer: Medicare Other | Admitting: Cardiovascular Disease

## 2012-08-16 ENCOUNTER — Encounter: Payer: Self-pay | Admitting: Cardiovascular Disease

## 2012-08-16 VITALS — BP 140/72 | HR 83 | Ht 66.0 in | Wt 170.0 lb

## 2012-08-16 DIAGNOSIS — I340 Nonrheumatic mitral (valve) insufficiency: Secondary | ICD-10-CM

## 2012-08-16 DIAGNOSIS — I059 Rheumatic mitral valve disease, unspecified: Secondary | ICD-10-CM

## 2012-08-16 DIAGNOSIS — I4891 Unspecified atrial fibrillation: Secondary | ICD-10-CM

## 2012-08-16 DIAGNOSIS — R079 Chest pain, unspecified: Secondary | ICD-10-CM

## 2012-08-16 NOTE — Progress Notes (Signed)
History of Present Illness: 72 yo female with history of DM, HTN, OA, atrial fibrillation, GERD, cataracts, who is here today for cardiac follow up. I saw her as a new patient for evaluation of atrial fibrillation and severe MR on 07/03/12 and most recently on 07/26/12 for f/u. She was diagnosed with atrial fibrillation on 06/27/12 in primary care and was started on Metoprolol 50 mg po BID for rate control and Xarelto 20 mg po Qdaily. Echo with normal LV size and function severe MR, moderate TR. We planned on 3 more weeks of anti-coagulation before consideration for a cardioversion. She had a TEE guided cardioversion on Jul 31, 2012 with return to sinus rhythm. Her MR was noted to be central and severe. The LV cavity was not dilated. LV systolic function was normal. There was moderate to severe TR as well. She converted to NSR. We planned f/u today to discuss her anticoagulation and possible need for surgical referral for MV.   She is here today for follow up. She is back in atrial fibrillation. She is tolerating her anti-coagulation without any bleeding. She continues to have exertional chest pressure and dyspnea. No syncope or near syncope.   Primary Care Physician: Dr. Nicholos Johns [facility])  Past Medical History  Diagnosis Date  . Diabetes mellitus   . Arthritis   . Hypertension   . Atrial fibrillation   . Mitral regurgitation   . GERD (gastroesophageal reflux disease)   . Cataracts, bilateral   . PONV (postoperative nausea and vomiting)     Past Surgical History  Procedure Laterality Date  . Mastectomy Left 1982  . Cesarean section      x 2  . Tee without cardioversion N/A 07/31/2012    Procedure: TRANSESOPHAGEAL ECHOCARDIOGRAM (TEE);  Surgeon: Wendall Stade, MD;  Location: [facility] ENDOSCOPY;  Service: Cardiovascular;  Laterality: N/A;  . Cardioversion N/A 07/31/2012    Procedure: CARDIOVERSION;  Surgeon: Wendall Stade, MD;  Location: [facility] ENDOSCOPY;  Service:  Cardiovascular;  Laterality: N/A;    Current Outpatient Prescriptions  Medication Sig Dispense Refill  . calcium carbonate 200 MG capsule Calcium citrate  /vitamin D 630mg  (calicum)  1 tab daily      . glipiZIDE (GLUCOTROL XL) 10 MG 24 hr tablet 1 tab daily      . Lancets 28G MISC Lancets and test strips      . LEVEMIR FLEXPEN 100 UNIT/ML injection As directed      . losartan (COZAAR) 50 MG tablet 1 tab daily      . lovastatin (MEVACOR) 40 MG tablet 1 tab daily      . meloxicam (MOBIC) 7.5 MG tablet Take 1 tab daily      . metFORMIN (GLUCOPHAGE) 500 MG tablet Take 2 tab twice a day      . metoprolol (LOPRESSOR) 50 MG tablet Take 1/2 tab bid      . Multiple Vitamin (MULTIVITAMIN) tablet Take 1 tablet by mouth daily.      Marland Kitchen omeprazole (PRILOSEC) 40 MG capsule 1 tab daily      . PARoxetine (PAXIL) 30 MG tablet 1 tab daily      . Rivaroxaban (XARELTO) 20 MG TABS Take by mouth daily.       No current facility-administered medications for this visit.    Allergies  Allergen Reactions  . Ultram (Tramadol) Nausea And Vomiting    History   Social History  . Marital Status: Widowed    Spouse Name: N/A  Number of Children: 3  . Years of Education: N/A   Occupational History  . Retired- [employer]    Social History Main Topics  . Smoking status: Never Smoker   . Smokeless tobacco: Not on file  . Alcohol Use: No  . Drug Use: No  . Sexually Active: No   Other Topics Concern  . Not on file   Social History Narrative  . No narrative on file    Family History  Problem Relation Age of Onset  . Cancer Father   . Heart attack Paternal Grandfather     Review of Systems:  As stated in the HPI and otherwise negative.   BP 140/72  Pulse 83  Ht 5\' 6"  (1.676 m)  Wt 170 lb (77.111 kg)  BMI 27.45 kg/m2  Physical Examination: General: Well developed, well nourished, NAD HEENT: OP clear, mucus membranes moist SKIN: warm, dry. No rashes. Neuro: No focal  deficits Musculoskeletal: Muscle strength 5/5 all ext Psychiatric: Mood and affect normal Neck: No JVD, no carotid bruits, no thyromegaly, no lymphadenopathy. Lungs:Clear bilaterally, no wheezes, rhonci, crackles Cardiovascular: Regular rate and rhythm. No murmurs, gallops or rubs. Abdomen:Soft. Bowel sounds present. Non-tender.  Extremities: No lower extremity edema. Pulses are 2 + in the bilateral DP/PT.  EKG: Atrial fibrillation, rate 83 bpm.   TEE 07/31/12: Left ventricle: The cavity size was normal. Hypertrophy was noted. Systolic function was normal. The estimated ejection fraction was in the range of 55% to 60%. - Mitral valve: Severe central MR - Left atrium: The atrium was severely dilated. - Right atrium: The atrium was moderately to severely dilated. - Atrial septum: There was a patent foramen ovale. - Tricuspid valve: Moderate-severe regurgitation. - Impressions: No LAA thrombus. See CV procedure note regarding [facility]  Assessment and Plan:   1. Atrial fibrillation: She is s/p TEE guided cardioversion on Jul 31, 2012. Back in atrial fibrillation today. She has been managed with Xarelto for anti-coagulation. Will plan on continuing Xarelto for one month following cardioversion then will stop the Xarelto and plan right and left cardiac cath on June 11th in the [facility] lab  to exclude CAD, assess pressures. If we pursue surgery, can hopefully consider MAZE procedure.   2. Severe Mitral valve regurgitation: Severe central MR by echo April and confirmed on TEE 07/31/12. Marland Kitchen Her LV size and function is normal. I think she needs surgical referral. Will plan right and left heart cath on June 11th and then arrange surgery visit.   3. Chest pain: Left heart cath as above. No known CAD.

## 2012-08-16 NOTE — Patient Instructions (Addendum)
Your physician has requested that you have a cardiac catheterization. Cardiac catheterization is used to diagnose and/or treat various heart conditions. Doctors may recommend this procedure for a number of different reasons. The most common reason is to evaluate chest pain. Chest pain can be a symptom of coronary artery disease (CAD), and cardiac catheterization can show whether plaque is narrowing or blocking your heart's arteries. This procedure is also used to evaluate the valves, as well as measure the blood flow and oxygen levels in different parts of your heart. For further information please visit [email or web address] Please follow instruction sheet, as given. Scheduled for September 05, 2012   Your physician recommends that you return for lab work on September 03, 2012.  The lab opens at 7:30. You do not have to be fasting for this lab work.

## 2012-08-22 ENCOUNTER — Ambulatory Visit: Payer: Medicare Other | Admitting: Cardiovascular Disease

## 2012-09-03 ENCOUNTER — Other Ambulatory Visit (INDEPENDENT_AMBULATORY_CARE_PROVIDER_SITE_OTHER): Payer: Medicare Other

## 2012-09-03 DIAGNOSIS — I340 Nonrheumatic mitral (valve) insufficiency: Secondary | ICD-10-CM

## 2012-09-03 DIAGNOSIS — R079 Chest pain, unspecified: Secondary | ICD-10-CM

## 2012-09-03 DIAGNOSIS — I059 Rheumatic mitral valve disease, unspecified: Secondary | ICD-10-CM

## 2012-09-03 DIAGNOSIS — I4891 Unspecified atrial fibrillation: Secondary | ICD-10-CM

## 2012-09-03 LAB — CBC WITH DIFFERENTIAL/PLATELET
Basophils Relative: 0.4 % (ref 0.0–3.0)
Eosinophils Absolute: 0.1 10*3/uL (ref 0.0–0.7)
Lymphs Abs: 2.4 10*3/uL (ref 0.7–4.0)
MCHC: 32.5 g/dL (ref 30.0–36.0)
MCV: 88.2 fl (ref 78.0–100.0)
Monocytes Absolute: 0.7 10*3/uL (ref 0.1–1.0)
Neutrophils Relative %: 52.7 % (ref 43.0–77.0)
Platelets: 253 10*3/uL (ref 150.0–400.0)

## 2012-09-03 LAB — BASIC METABOLIC PANEL
BUN: 22 mg/dL (ref 6–23)
CO2: 26 mEq/L (ref 19–32)
Chloride: 108 mEq/L (ref 96–112)
Creatinine, Ser: 0.8 mg/dL (ref 0.4–1.2)

## 2012-09-03 LAB — PROTIME-INR
INR: 1.1 ratio — ABNORMAL HIGH (ref 0.8–1.0)
Prothrombin Time: 11.2 s (ref 10.2–12.4)

## 2012-09-04 ENCOUNTER — Telehealth: Payer: Self-pay | Admitting: Cardiovascular Disease

## 2012-09-04 NOTE — Telephone Encounter (Signed)
New Problem   Pt's daughter has questions about the procedure that her mom is having tomorrow. She asked if you could call her back when you get a chance.

## 2012-09-04 NOTE — Telephone Encounter (Signed)
Returned call to patient's daughter Clydie Braun.Cath instructions were explained to her.

## 2012-09-05 ENCOUNTER — Encounter (HOSPITAL_BASED_OUTPATIENT_CLINIC_OR_DEPARTMENT_OTHER)
Admission: RE | Disposition: A | Discharge status: Home / Self Care | Payer: Self-pay | Source: Ambulatory Visit | Attending: Cardiovascular Disease

## 2012-09-05 ENCOUNTER — Other Ambulatory Visit: Payer: Self-pay | Admitting: *Deleted

## 2012-09-05 ENCOUNTER — Inpatient Hospital Stay (HOSPITAL_BASED_OUTPATIENT_CLINIC_OR_DEPARTMENT_OTHER)
Admission: RE | Admit: 2012-09-05 | Discharge: 2012-09-05 | Disposition: A | Discharge status: Home / Self Care | Payer: Medicare Other | Source: Ambulatory Visit | Attending: Cardiovascular Disease | Admitting: Cardiovascular Disease

## 2012-09-05 DIAGNOSIS — E119 Type 2 diabetes mellitus without complications: Secondary | ICD-10-CM | POA: Insufficient documentation

## 2012-09-05 DIAGNOSIS — R0609 Other forms of dyspnea: Secondary | ICD-10-CM | POA: Insufficient documentation

## 2012-09-05 DIAGNOSIS — R0989 Other specified symptoms and signs involving the circulatory and respiratory systems: Secondary | ICD-10-CM | POA: Insufficient documentation

## 2012-09-05 DIAGNOSIS — M1990 Unspecified osteoarthritis, unspecified site: Secondary | ICD-10-CM | POA: Insufficient documentation

## 2012-09-05 DIAGNOSIS — H269 Unspecified cataract: Secondary | ICD-10-CM | POA: Insufficient documentation

## 2012-09-05 DIAGNOSIS — I4891 Unspecified atrial fibrillation: Secondary | ICD-10-CM | POA: Insufficient documentation

## 2012-09-05 DIAGNOSIS — I739 Peripheral vascular disease, unspecified: Secondary | ICD-10-CM

## 2012-09-05 DIAGNOSIS — I2510 Atherosclerotic heart disease of native coronary artery without angina pectoris: Secondary | ICD-10-CM | POA: Insufficient documentation

## 2012-09-05 DIAGNOSIS — I340 Nonrheumatic mitral (valve) insufficiency: Secondary | ICD-10-CM

## 2012-09-05 DIAGNOSIS — K219 Gastro-esophageal reflux disease without esophagitis: Secondary | ICD-10-CM | POA: Insufficient documentation

## 2012-09-05 DIAGNOSIS — R0789 Other chest pain: Secondary | ICD-10-CM | POA: Insufficient documentation

## 2012-09-05 DIAGNOSIS — I059 Rheumatic mitral valve disease, unspecified: Secondary | ICD-10-CM

## 2012-09-05 DIAGNOSIS — I10 Essential (primary) hypertension: Secondary | ICD-10-CM | POA: Insufficient documentation

## 2012-09-05 HISTORY — PX: CARDIAC CATHETERIZATION: SHX172

## 2012-09-05 LAB — POCT I-STAT 3, VENOUS BLOOD GAS (G3P V)
O2 Saturation: 56 %
pCO2, Ven: 50.1 mmHg — ABNORMAL HIGH (ref 45.0–50.0)
pH, Ven: 7.296 (ref 7.250–7.300)
pO2, Ven: 33 mmHg (ref 30.0–45.0)

## 2012-09-05 LAB — POCT I-STAT GLUCOSE
Glucose, Bld: 94 mg/dL (ref 70–99)
Operator id: 221371

## 2012-09-05 LAB — POCT I-STAT 3, ART BLOOD GAS (G3+)
Acid-base deficit: 3 mmol/L — ABNORMAL HIGH (ref 0.0–2.0)
O2 Saturation: 93 %
pO2, Arterial: 70 mmHg — ABNORMAL LOW (ref 80.0–100.0)

## 2012-09-05 SURGERY — JV LEFT AND RIGHT HEART CATHETERIZATION WITH CORONARY ANGIOGRAM

## 2012-09-05 MED ORDER — SODIUM CHLORIDE 0.9 % IV SOLN: INTRAVENOUS | Status: DC

## 2012-09-05 MED ORDER — ASPIRIN 81 MG PO CHEW
324.0000 mg | CHEWABLE_TABLET | ORAL | Status: AC
  Administered 2012-09-05: 324 mg via ORAL

## 2012-09-05 MED ORDER — SODIUM CHLORIDE 0.9 % IJ SOLN: 3.0000 mL | Freq: Two times a day (BID) | INTRAMUSCULAR | Status: DC

## 2012-09-05 MED ORDER — ACETAMINOPHEN 325 MG PO TABS: 650.0000 mg | ORAL_TABLET | ORAL | Status: DC | PRN

## 2012-09-05 MED ORDER — DIAZEPAM 5 MG PO TABS
5.0000 mg | ORAL_TABLET | ORAL | Status: AC
  Administered 2012-09-05: 5 mg via ORAL

## 2012-09-05 MED ORDER — SODIUM CHLORIDE 0.9 % IJ SOLN: 3.0000 mL | INTRAMUSCULAR | Status: DC | PRN

## 2012-09-05 MED ORDER — SODIUM CHLORIDE 0.9 % IV SOLN: 250.0000 mL | INTRAVENOUS | Status: DC | PRN

## 2012-09-05 MED ORDER — ONDANSETRON HCL 4 MG/2ML IJ SOLN: 4.0000 mg | Freq: Four times a day (QID) | INTRAMUSCULAR | Status: DC | PRN

## 2012-09-05 MED ORDER — SODIUM CHLORIDE 0.9 % IV SOLN: INTRAVENOUS | Status: AC

## 2012-09-05 NOTE — CV Procedure (Signed)
Cardiac Catheterization Operative Report  Cynara Tatham Hardge [id number] 6/11/20149:19 AM Georgianne Fick, MD  Procedure Performed:  1. Left Heart Catheterization 2. Selective Coronary Angiography 3. Right Heart Catheterization 4. Left ventricular angiogram 5. Distal aortogram  Operator: Verne Carrow, MD  Indication:  72 yo female with history of DM, HTN, OA, atrial fibrillation, GERD and recent diagnosis of severe MR/TR who is here today for cardiac cath. I saw her as a new patient for evaluation of atrial fibrillation and severe MR on 07/03/12.  She was diagnosed with atrial fibrillation on 06/27/12 in primary care and was started on Metoprolol 50 mg po BID for rate control and Xarelto 20 mg po Qdaily. Echo with normal LV size and function severe MR, moderate TR. We planned on 3 more weeks of anti-coagulation before consideration for a cardioversion. She had a TEE guided cardioversion on Jul 31, 2012 with return to sinus rhythm. Her MR was noted to be central and severe. The LV cavity was not dilated. LV systolic function was normal. There was moderate to severe TR as well. She converted back to atrial fibrillation. She continues to have exertional chest pressure and dyspnea. No syncope or near syncope. Right and left heart cath today to exclude obstructive CAD and assess filling pressures.                                Procedure Details: The risks, benefits, complications, treatment options, and expected outcomes were discussed with the patient. The patient and/or family concurred with the proposed plan, giving informed consent. The patient was brought to the cath lab after IV hydration was begun and oral premedication was given. The patient was further sedated with Versed and Fentanyl. The patient was prepped and draped in the usual manner. Using the modified Seldinger access technique, a 5 French sheath was placed in the femoral artery. A 6 French sheath was inserted into the right  femoral vein. A balloon tipped catheter was used to perform a right heart catheterization. Standard diagnostic catheters were used to perform selective coronary angiography. A pigtail catheter was used to perform a left ventricular angiogram. There were no immediate complications. The patient was taken to the recovery area in stable condition.   Hemodynamic Findings: Ao: 123/62         LV: 124/13/5 RA:   6            RV: 36/4/9 PA:  37/13 (mean 25) PCWP:  12 Fick Cardiac Output: 3.8 L/min Fick Cardiac Index: 2.0 L/min/m2 Central Aortic Saturation: 93% Pulmonary Artery Saturation: 56%  Angiographic Findings:  Left main:  No obstructive disease.   Left Anterior Descending Artery: Moderate caliber that reaches the apex but quickly tapers to a small caliber vessel. There is mild plaque in the mid vessel. The diagonal branch is small in caliber with no obstructive disease.   Circumflex Artery: Moderate caliber vessel with mild plaque in the proximal and mid vessel.   Right Coronary Artery: Small to moderate caliber dominant vessel with mild mid plaque.   Left Ventricular Angiogram: LVEF=65-70%. 2-3+ MR  Distal Aortogram: No aneurysm or obstructive disease noted. Bilateral renal arteries are patent.   Impression: 1. Mild non-obstructive CAD 2. Severe MR 3. Normal LV systolic function.   Recommendations: We will resume Xarelto for anti-coagulation tomorrow. She will be referred to CT surgery for consideration of MV repair/replacement.        Complications:  None;  patient tolerated the procedure well.

## 2012-09-05 NOTE — OR Nursing (Signed)
Discharge instructions reviewed and signed, pt stated understanding, ambulated in hall without difficulty, site level 0, transported to daughter's car via wheelchair 

## 2012-09-05 NOTE — OR Nursing (Signed)
Meal served 

## 2012-09-05 NOTE — H&P (View-Only) (Signed)
 History of Present Illness: 72 yo female with history of DM, HTN, OA, atrial fibrillation, GERD, cataracts, who is here today for cardiac follow up. I saw her as a new patient for evaluation of atrial fibrillation and severe MR on 07/03/12 and most recently on 07/26/12 for f/u. She was diagnosed with atrial fibrillation on 06/27/12 in primary care and was started on Metoprolol 50 mg po BID for rate control and Xarelto 20 mg po Qdaily. Echo with normal LV size and function severe MR, moderate TR. We planned on 3 more weeks of anti-coagulation before consideration for a cardioversion. She had a TEE guided cardioversion on Jul 31, 2012 with return to sinus rhythm. Her MR was noted to be central and severe. The LV cavity was not dilated. LV systolic function was normal. There was moderate to severe TR as well. She converted to NSR. We planned f/u today to discuss her anticoagulation and possible need for surgical referral for MV.   She is here today for follow up. She is back in atrial fibrillation. She is tolerating her anti-coagulation without any bleeding. She continues to have exertional chest pressure and dyspnea. No syncope or near syncope.   Primary Care Physician: Dr. Ramachandran ([facility])  Past Medical History  Diagnosis Date  . Diabetes mellitus   . Arthritis   . Hypertension   . Atrial fibrillation   . Mitral regurgitation   . GERD (gastroesophageal reflux disease)   . Cataracts, bilateral   . PONV (postoperative nausea and vomiting)     Past Surgical History  Procedure Laterality Date  . Mastectomy Left 1982  . Cesarean section      x 2  . Tee without cardioversion N/A 07/31/2012    Procedure: TRANSESOPHAGEAL ECHOCARDIOGRAM (TEE);  Surgeon: Peter C Nishan, MD;  Location: [facility] ENDOSCOPY;  Service: Cardiovascular;  Laterality: N/A;  . Cardioversion N/A 07/31/2012    Procedure: CARDIOVERSION;  Surgeon: Peter C Nishan, MD;  Location: [facility] ENDOSCOPY;  Service:  Cardiovascular;  Laterality: N/A;    Current Outpatient Prescriptions  Medication Sig Dispense Refill  . calcium carbonate 200 MG capsule Calcium citrate  /vitamin D 630mg (calicum)  1 tab daily      . glipiZIDE (GLUCOTROL XL) 10 MG 24 hr tablet 1 tab daily      . Lancets 28G MISC Lancets and test strips      . LEVEMIR FLEXPEN 100 UNIT/ML injection As directed      . losartan (COZAAR) 50 MG tablet 1 tab daily      . lovastatin (MEVACOR) 40 MG tablet 1 tab daily      . meloxicam (MOBIC) 7.5 MG tablet Take 1 tab daily      . metFORMIN (GLUCOPHAGE) 500 MG tablet Take 2 tab twice a day      . metoprolol (LOPRESSOR) 50 MG tablet Take 1/2 tab bid      . Multiple Vitamin (MULTIVITAMIN) tablet Take 1 tablet by mouth daily.      . omeprazole (PRILOSEC) 40 MG capsule 1 tab daily      . PARoxetine (PAXIL) 30 MG tablet 1 tab daily      . Rivaroxaban (XARELTO) 20 MG TABS Take by mouth daily.       No current facility-administered medications for this visit.    Allergies  Allergen Reactions  . Ultram (Tramadol) Nausea And Vomiting    History   Social History  . Marital Status: Widowed    Spouse Name: N/A      Number of Children: 3  . Years of Education: N/A   Occupational History  . Retired-[employer]    Social History Main Topics  . Smoking status: Never Smoker   . Smokeless tobacco: Not on file  . Alcohol Use: No  . Drug Use: No  . Sexually Active: No   Other Topics Concern  . Not on file   Social History Narrative  . No narrative on file    Family History  Problem Relation Age of Onset  . Cancer Father   . Heart attack Paternal Grandfather     Review of Systems:  As stated in the HPI and otherwise negative.   BP 140/72  Pulse 83  Ht 5' 6" (1.676 m)  Wt 170 lb (77.111 kg)  BMI 27.45 kg/m2  Physical Examination: General: Well developed, well nourished, NAD HEENT: OP clear, mucus membranes moist SKIN: warm, dry. No rashes. Neuro: No focal  deficits Musculoskeletal: Muscle strength 5/5 all ext Psychiatric: Mood and affect normal Neck: No JVD, no carotid bruits, no thyromegaly, no lymphadenopathy. Lungs:Clear bilaterally, no wheezes, rhonci, crackles Cardiovascular: Regular rate and rhythm. No murmurs, gallops or rubs. Abdomen:Soft. Bowel sounds present. Non-tender.  Extremities: No lower extremity edema. Pulses are 2 + in the bilateral DP/PT.  EKG: Atrial fibrillation, rate 83 bpm.   TEE 07/31/12: Left ventricle: The cavity size was normal. Hypertrophy was noted. Systolic function was normal. The estimated ejection fraction was in the range of 55% to 60%. - Mitral valve: Severe central MR - Left atrium: The atrium was severely dilated. - Right atrium: The atrium was moderately to severely dilated. - Atrial septum: There was a patent foramen ovale. - Tricuspid valve: Moderate-severe regurgitation. - Impressions: No LAA thrombus. See CV procedure note regarding DCC  Assessment and Plan:   1. Atrial fibrillation: She is s/p TEE guided cardioversion on Jul 31, 2012. Back in atrial fibrillation today. She has been managed with Xarelto for anti-coagulation. Will plan on continuing Xarelto for one month following cardioversion then will stop the Xarelto and plan right and left cardiac cath on June 11th in the [facility] lab  to exclude CAD, assess pressures. If we pursue surgery, can hopefully consider MAZE procedure.   2. Severe Mitral valve regurgitation: Severe central MR by echo April and confirmed on TEE 07/31/12. . Her LV size and function is normal. I think she needs surgical referral. Will plan right and left heart cath on June 11th and then arrange surgery visit.   3. Chest pain: Left heart cath as above. No known CAD.  

## 2012-09-05 NOTE — OR Nursing (Signed)
Dr McAlhany at bedside to discuss results and treatment plan with pt and family 

## 2012-09-05 NOTE — Interval H&P Note (Signed)
History and Physical Interval Note:  09/05/2012 8:39 AM  Rachel Santos  has presented today for surgery, with the diagnosis of cp  The various methods of treatment have been discussed with the patient and family. After consideration of risks, benefits and other options for treatment, the patient has consented to  Procedure(s): JV LEFT AND RIGHT HEART CATHETERIZATION WITH CORONARY ANGIOGRAM (N/A) as a surgical intervention .  The patient's history has been reviewed, patient examined, no change in status, stable for surgery.  I have reviewed the patient's chart and labs.  Questions were answered to the patient's satisfaction.     Gearline Spilman

## 2012-09-05 NOTE — OR Nursing (Signed)
Tegaderm dressing applied, site level 0, bedrest begins at 0930 

## 2012-09-24 ENCOUNTER — Institutional Professional Consult (permissible substitution) (INDEPENDENT_AMBULATORY_CARE_PROVIDER_SITE_OTHER): Payer: Medicare Other | Admitting: Thoracic Surgery (Cardiothoracic Vascular Surgery)

## 2012-09-24 ENCOUNTER — Encounter: Payer: Self-pay | Admitting: Thoracic Surgery (Cardiothoracic Vascular Surgery)

## 2012-09-24 ENCOUNTER — Other Ambulatory Visit: Payer: Self-pay | Admitting: *Deleted

## 2012-09-24 VITALS — BP 115/58 | HR 56 | Resp 16 | Ht 66.5 in | Wt 172.0 lb

## 2012-09-24 DIAGNOSIS — I071 Rheumatic tricuspid insufficiency: Secondary | ICD-10-CM | POA: Insufficient documentation

## 2012-09-24 DIAGNOSIS — R519 Headache, unspecified: Secondary | ICD-10-CM

## 2012-09-24 DIAGNOSIS — I712 Thoracic aortic aneurysm, without rupture, unspecified: Secondary | ICD-10-CM

## 2012-09-24 DIAGNOSIS — I4891 Unspecified atrial fibrillation: Secondary | ICD-10-CM

## 2012-09-24 DIAGNOSIS — R42 Dizziness and giddiness: Secondary | ICD-10-CM

## 2012-09-24 DIAGNOSIS — I059 Rheumatic mitral valve disease, unspecified: Secondary | ICD-10-CM

## 2012-09-24 DIAGNOSIS — I340 Nonrheumatic mitral (valve) insufficiency: Secondary | ICD-10-CM

## 2012-09-24 DIAGNOSIS — I079 Rheumatic tricuspid valve disease, unspecified: Secondary | ICD-10-CM

## 2012-09-24 NOTE — Progress Notes (Signed)
[address]       [place] [address]             [phone]     CARDIOTHORACIC SURGERY CONSULTATION REPORT  Referring Provider is Verne Carrow D*MD PCP is Georgianne Fick, MD  Chief Complaint  Patient presents with  . Mitral Regurgitation    TEE 07/31/12  CATHED  09/05/12    HPI:  Patient is a 72 year old divorced white female from [place] who reports that she was first told she had a heart murmur many years ago. She denies any known history of rheumatic fever or rheumatic heart disease, and she states that when she was evaluated for possible heart disease in the distant past she was told that there was nothing significant to be concerned about.she has been followed for several years by her primary care physician with known history of hypertension, type 2 diabetes mellitus, and osteoarthritis. She describes a gradual progression of symptoms of exertional shortness of breath and fatigue over the last few years. In recent months she has developed more significant exertional shortness of breath with occasional episodes of resting shortness of breath.  In early April the patient had a brief episode of transient monocular blindness that lasted less than 30 minutes. She initially went to her ophthalmologist who was concerned about a possible TIA and subsequently referred her to her primary care physician, Dr. Nicholos Johns.  She was noted to be in atrial fibrillation at that time, and she was started on Xarelto and metoprolol and referred for cardiology consultation.  She underwent 2D echocardiogram and was evaluated by Dr.McAlhany on 07/03/2012.  Transthoracic echocardiogram demonstrated the presence of normal left ventricular size and systolic function with severe mitral regurgitation and moderate tricuspid regurgitation.  A carotid duplex scan was performed revealing no significant extracranial carotid artery stenosis.  She was scheduled for elective  transesophageal echocardiogram with DC cardioversion on 07/31/2012. Transesophageal echocardiogram confirmed the presence of severe mitral regurgitation. The patient was cardioverted to sinus rhythm but had are equal and back into persistent atrial fibrillation by the time of her next office visit on 08/16/2012. She subsequently underwent left and right heart catheterization earlier this month revealing mild non-obstructive coronary artery disease with severe mitral regurgitation and preserved LV systolic function.  She has now been referred for elective surgical consultation.  The patient describes a long history of progressive exertional shortness of breath which has gotten considerably worse over the last few months. She now get short of breath with relatively mild activity, and she has occasional episodes of resting shortness of breath. She denies any PND, orthopnea, or lower extremity edema. She has occasional mild dizzy spells without syncope. She has occasional mild very transient episodes of tightness across her chest.  She has not had any other transient neurologic events other than the episode of transient left sided monocular blindness which occurred in early April.  Past Medical History  Diagnosis Date  . Diabetes mellitus   . Arthritis   . Hypertension   . Atrial fibrillation   . Mitral regurgitation   . GERD (gastroesophageal reflux disease)   . Cataracts, bilateral   . PONV (postoperative nausea and vomiting)   . Tricuspid regurgitation     Past Surgical History  Procedure Laterality Date  . Mastectomy Left 1982  . Cesarean section      x 2  . Tee without cardioversion N/A 07/31/2012    Procedure: TRANSESOPHAGEAL ECHOCARDIOGRAM (TEE);  Surgeon: Wendall Stade,  MD;  Location: [facility] ENDOSCOPY;  Service: Cardiovascular;  Laterality: N/A;  . Cardioversion N/A 07/31/2012    Procedure: CARDIOVERSION;  Surgeon: Wendall Stade, MD;  Location: [facility] ENDOSCOPY;  Service: Cardiovascular;   Laterality: N/A;    Family History  Problem Relation Age of Onset  . Cancer Father   . Heart attack Paternal Grandfather     History   Social History  . Marital Status: Widowed    Spouse Name: N/A    Number of Children: 3  . Years of Education: N/A   Occupational History  . Retired-[employer]    Social History Main Topics  . Smoking status: Former Smoker    Types: Cigarettes    Quit date: 09/25/2006  . Smokeless tobacco: Not on file  . Alcohol Use: No  . Drug Use: No  . Sexually Active: No   Other Topics Concern  . Not on file   Social History Narrative  . No narrative on file    Current Outpatient Prescriptions  Medication Sig Dispense Refill  . calcium carbonate 200 MG capsule Calcium citrate  /vitamin D 630mg  (calicum)  1 tab daily      . glipiZIDE (GLUCOTROL XL) 10 MG 24 hr tablet 1 tab daily      . Lancets 28G MISC Lancets and test strips      . LEVEMIR FLEXPEN 100 UNIT/ML injection As directed      . losartan (COZAAR) 50 MG tablet 1 tab daily      . meloxicam (MOBIC) 7.5 MG tablet Take 1 tab daily      . metoprolol (LOPRESSOR) 50 MG tablet Take 1/2 tab bid      . Multiple Vitamin (MULTIVITAMIN) tablet Take 1 tablet by mouth daily.      Marland Kitchen omeprazole (PRILOSEC) 40 MG capsule 1 tab daily      . PARoxetine (PAXIL) 30 MG tablet 1 tab daily      . Rivaroxaban (XARELTO) 20 MG TABS Take by mouth daily.      Marland Kitchen lovastatin (MEVACOR) 40 MG tablet 1 tab daily       No current facility-administered medications for this visit.    Allergies  Allergen Reactions  . Ultram (Tramadol) Nausea And Vomiting      Review of Systems:   General:  normal appetite, normal energy, no weight gain, no weight loss, no fever  Cardiac:  no chest pain with exertion, no chest pain at rest, + SOB with mild exertion, + occasional resting SOB, no PND, no orthopnea, no palpitations, + arrhythmia, + atrial fibrillation, no LE edema, + dizzy spells, no  syncope  Respiratory:  + shortness of breath, no home oxygen, no productive cough, no dry cough, no bronchitis, no wheezing, no hemoptysis, no asthma, no pain with inspiration or cough, no sleep apnea, no CPAP at night  GI:   no difficulty swallowing, no reflux, no frequent heartburn, + hiatal hernia, no abdominal pain, no constipation, no diarrhea, no hematochezia, no hematemesis, no melena  GU:   no dysuria,  no frequency, no urinary tract infection, no hematuria, no kidney stones, no kidney disease  Vascular:  no pain suggestive of claudication, no pain in feet, no leg cramps, + varicose veins, + DVT, no non-healing foot ulcer  Neuro:   no stroke, + TIA's, no seizures, no headaches, + temporary blindness one eye,  no slurred speech, no peripheral neuropathy, + chronic pain in right knee, + instability of gait, + memory/cognitive dysfunction including progressive  short term memory  Musculoskeletal: + arthritis both knees R>L, no joint swelling, no myalgias, mild difficulty walking, decreased mobility   Skin:   no rash, no itching, no skin infections, no pressure sores or ulcerations  Psych:   + anxiety, + depression, + nervousness, + unusual recent stress  Eyes:   no blurry vision, no floaters, no recent vision changes, no wears glasses or contacts  ENT:   no hearing loss, no loose or painful teeth, + dentures  Hematologic:  + easy bruising, no abnormal bleeding, no clotting disorder, no frequent epistaxis  Endocrine:  + diabetes, routinely checks CBG's at home     Physical Exam:   BP 115/58  Pulse 56  Resp 16  Ht 5' 6.5" (1.689 m)  Wt 172 lb (78.019 kg)  BMI 27.35 kg/m2  SpO2 95%  General:  Elderly and mildly frail-appearing  HEENT:  Unremarkable   Neck:   no JVD, no bruits, no adenopathy   Chest:   clear to auscultation, symmetrical breath sounds, no wheezes, no rhonchi, previous left mastectomy   CV:   RRR, + II/VI systolic murmur   Abdomen:  soft, non-tender, no masses    Extremities:  warm, well-perfused, pulses diminished on right, no LE edema  Rectal/GU  Deferred  Neuro:   Grossly non-focal and symmetrical throughout  Skin:   Clean and dry, no rashes, no breakdown   Diagnostic Tests:  Transthoracic Echocardiography  Patient: Zykeriah, Mathia MR #: [id number] Study Date: 06/29/2012 Gender: F Age: 52 Height: 167.6cm Weight: 78kg BSA: 1.65m^2 Pt. Status: Room:  ATTENDING Carolann Littler, Ajith REFERRING Nicholos Johns, Ajith PERFORMING [facility], [facility] SONOGRAPHER Philomena Course, RDCS cc:  ------------------------------------------------------------ LV EF: 55% - 60%  ------------------------------------------------------------ Indications: Atrial fibrillation - 427.31. TIA 435.9.  ------------------------------------------------------------ History: PMH: Breast cancer, left mastectomy. Acquired from the patient and from the patient's chart. Atrial fibrillation. Transient ischemic attack. Risk factors: Hypertension. Diabetes mellitus. Dyslipidemia.  ------------------------------------------------------------ Study Conclusions  - Left ventricle: The cavity size was normal. Wall thickness was increased in a pattern of mild LVH. Systolic function was normal. The estimated ejection fraction was in the range of 55% to 60%. Wall motion was normal; there were no regional wall motion abnormalities. - Mitral valve: Calcified annulus. Severe regurgitation. - Left atrium: The atrium was moderately dilated. - Right atrium: The atrium was mildly dilated. - Tricuspid valve: Moderate regurgitation. - Pulmonary arteries: Systolic pressure was mildly increased. PA peak pressure: 40mm Hg (S). Transthoracic echocardiography. M-mode, complete 2D, spectral Doppler, and color Doppler. Height: Height: 167.6cm. Height: 66in. Weight: Weight: 78kg. Weight: 171.6lb. Body mass index: BMI: 27.8kg/m^2. Body surface area: BSA:  1.66m^2. Blood pressure: 109/63. Patient status: Outpatient. Location: [place] [facility]  ------------------------------------------------------------  ------------------------------------------------------------ Left ventricle: The cavity size was normal. Wall thickness was increased in a pattern of mild LVH. Systolic function was normal. The estimated ejection fraction was in the range of 55% to 60%. Wall motion was normal; there were no regional wall motion abnormalities. The study was not technically sufficient to allow evaluation of LV diastolic dysfunction due to atrial fibrillation.  ------------------------------------------------------------ Aortic valve: Trileaflet; mildly calcified leaflets. Sclerosis without stenosis. Doppler: No regurgitation.  ------------------------------------------------------------ Aorta: Aortic root: The aortic root was normal in size.  ------------------------------------------------------------ Mitral valve: Calcified annulus. Leaflet separation was normal. Doppler: Transvalvular velocity was within the normal range. There was no evidence for stenosis. Severe regurgitation.  ------------------------------------------------------------ Left atrium: The atrium was moderately dilated.  ------------------------------------------------------------ Right ventricle: The cavity  size was normal. Systolic function was normal.  ------------------------------------------------------------ Pulmonic valve: Structurally normal valve. Cusp separation was normal. Doppler: Transvalvular velocity was within the normal range. Trivial regurgitation.  ------------------------------------------------------------ Tricuspid valve: Structurally normal valve. Leaflet separation was normal. Doppler: Transvalvular velocity was within the normal range. Moderate regurgitation.  ------------------------------------------------------------ Pulmonary artery: Systolic  pressure was mildly increased.  ------------------------------------------------------------ Right atrium: The atrium was mildly dilated.  ------------------------------------------------------------ Pericardium: There was no pericardial effusion.  ------------------------------------------------------------ Systemic veins: Inferior vena cava: The vessel was normal in size; the respirophasic diameter changes were in the normal range (= 50%); findings are consistent with normal central venous pressure.  ------------------------------------------------------------  2D measurements Normal Doppler measurements Normal Left ventricle Main pulmonary LVID ED, 33.6 mm 43-52 artery chord, Pressure, 40 mm Hg =30 PLAX S LVID ES, 24.5 mm 23-38 LVOT chord, Peak vel, 84.2 cm/s ------ PLAX S FS, chord, 27 % >29 VTI, S 17.8 cm ------ PLAX Stroke 61.7 ml ------ LVPW, ED 12.9 mm ------ vol IVS/LVPW 1.06 <1.3 Stroke 32.8 ml/m^2 ------ ratio, ED index Ventricular septum Mitral valve IVS, ED 13.7 mm ------ Regurg 57 cm/s ------ LVOT alias Diam, S 21 mm ------ vel, PISA Area 3.46 cm^2 ------ Max 518 cm/s ------ Diam 21 mm ------ regurg Aorta vel Root diam, 29 mm ------ Regurg 160 cm ------ ED VTI Left atrium ERO, PISA 0.25 cm^2 ------ AP dim 52 mm ------ Regurg 40 ml ------ AP dim 2.77 cm/m^2 <2.2 vol, PISA index RF, PISA 39.35 % ------ Tricuspid valve Regurg 295 cm/s ------ peak vel Peak 35 mm Hg ------ RV-RA gradient, S Systemic veins Estimated 5 mm Hg ------ CVP Right ventricle Pressure, 40 mm Hg <30 S  ------------------------------------------------------------ Prepared and Electronically Authenticated by  Olga Millers 2014-04-04T16:25:40.020       Transesophageal Echocardiography  Patient: Mckenze, Slone MR #: [id number] Study Date: 07/31/2012 Gender: F Age: 18 Height: 167.6cm Weight: 78.2kg BSA: 1.57m^2 Pt. Status: Room: [facility]  ADMITTING Charlton Haws ATTENDING Charlton Haws PERFORMING Charlton Haws SONOGRAPHER Perley Jain, RDCS ORDERING Clifton James, Christopher Myna Hidalgo, Christopher cc:  ------------------------------------------------------------ LV EF: 55% - 60%  ------------------------------------------------------------ Indications: Atrial fibrillation - 427.31.  ------------------------------------------------------------ History: PMH: Severe mitral regurgitation.  ------------------------------------------------------------ Study Conclusions  - Left ventricle: The cavity size was normal. Hypertrophy was noted. Systolic function was normal. The estimated ejection fraction was in the range of 55% to 60%. - Mitral valve: Severe central MR - Left atrium: The atrium was severely dilated. - Right atrium: The atrium was moderately to severely dilated. - Atrial septum: There was a patent foramen ovale. - Tricuspid valve: Moderate-severe regurgitation. - Impressions: No LAA thrombus. See CV procedure note regarding [facility] Impressions:  - No LAA thrombus. See CV procedure note regarding [facility] Transesophageal echocardiography. 2D and color Doppler. Height: Height: 167.6cm. Height: 66in. Weight: Weight: 78.2kg. Weight: 172lb. Body mass index: BMI: 27.8kg/m^2. Body surface area: BSA: 1.88m^2. Blood pressure: 105/47. Patient status: Outpatient. Location: Endoscopy.  ------------------------------------------------------------  ------------------------------------------------------------ Left ventricle: The cavity size was normal. Hypertrophy was noted. Systolic function was normal. The estimated ejection fraction was in the range of 55% to 60%.  ------------------------------------------------------------ Aortic valve: Mildly calcified leaflets.  ------------------------------------------------------------ Aorta: The aorta was normal, not dilated, and  non-diseased.  ------------------------------------------------------------ Mitral valve: Severe central MR  ------------------------------------------------------------ Left atrium: The atrium was severely dilated.  ------------------------------------------------------------ Atrial septum: There was a patent foramen ovale.  ------------------------------------------------------------ Right ventricle: The cavity size was normal. Wall thickness was normal. Systolic function was normal.  ------------------------------------------------------------ Tricuspid valve: Doppler:  Moderate-severe regurgitation.  ------------------------------------------------------------ Right atrium: The atrium was moderately to severely dilated.  ------------------------------------------------------------ Pericardium: The pericardium was normal in appearance. There was no pericardial effusion.  ------------------------------------------------------------ Post procedure conclusions Ascending Aorta:  - The aorta was normal, not dilated, and non-diseased.  ------------------------------------------------------------  Doppler measurements Normal Mitral valve Regurg 38.5 cm/s ------ alias vel, PISA  ------------------------------------------------------------ Prepared and Electronically Authenticated by  Charlton Haws 2014-05-12T12:51:33.807    Cardiac Catheterization Operative Report   Sheena Donegan Rochin  [id number]  6/11/20149:19 AM  Georgianne Fick, MD  Procedure Performed:  1. Left Heart Catheterization 2. Selective Coronary Angiography 3. Right Heart Catheterization 4. Left ventricular angiogram 5. Distal aortogram Operator: Verne Carrow, MD  Indication: 72 yo female with history of DM, HTN, OA, atrial fibrillation, GERD and recent diagnosis of severe MR/TR who is here today for cardiac cath. I saw her as a new patient for evaluation of atrial fibrillation and severe  MR on 07/03/12. She was diagnosed with atrial fibrillation on 06/27/12 in primary care and was started on Metoprolol 50 mg po BID for rate control and Xarelto 20 mg po Qdaily. Echo with normal LV size and function severe MR, moderate TR. We planned on 3 more weeks of anti-coagulation before consideration for a cardioversion. She had a TEE guided cardioversion on Jul 31, 2012 with return to sinus rhythm. Her MR was noted to be central and severe. The LV cavity was not dilated. LV systolic function was normal. There was moderate to severe TR as well. She converted back to atrial fibrillation. She continues to have exertional chest pressure and dyspnea. No syncope or near syncope. Right and left heart cath today to exclude obstructive CAD and assess filling pressures.  Procedure Details:  The risks, benefits, complications, treatment options, and expected outcomes were discussed with the patient. The patient and/or family concurred with the proposed plan, giving informed consent. The patient was brought to the cath lab after IV hydration was begun and oral premedication was given. The patient was further sedated with Versed and Fentanyl. The patient was prepped and draped in the usual manner. Using the modified Seldinger access technique, a 5 French sheath was placed in the femoral artery. A 6 French sheath was inserted into the right femoral vein. A balloon tipped catheter was used to perform a right heart catheterization. Standard diagnostic catheters were used to perform selective coronary angiography. A pigtail catheter was used to perform a left ventricular angiogram. There were no immediate complications. The patient was taken to the recovery area in stable condition.  Hemodynamic Findings:  Ao: 123/62  LV: 124/13/5  RA: 6  RV: 36/4/9  PA: 37/13 (mean 25)  PCWP: 12  Fick Cardiac Output: 3.8 L/min  Fick Cardiac Index: 2.0 L/min/m2  Central Aortic Saturation: 93%  Pulmonary Artery Saturation: 56%   Angiographic Findings:  Left main: No obstructive disease.  Left Anterior Descending Artery: Moderate caliber that reaches the apex but quickly tapers to a small caliber vessel. There is mild plaque in the mid vessel. The diagonal branch is small in caliber with no obstructive disease.  Circumflex Artery: Moderate caliber vessel with mild plaque in the proximal and mid vessel.  Right Coronary Artery: Small to moderate caliber dominant vessel with mild mid plaque.  Left Ventricular Angiogram: LVEF=65-70%. 2-3+ MR  Distal Aortogram: No aneurysm or obstructive disease noted. Bilateral renal arteries are patent.  Impression:  1. Mild non-obstructive CAD  2. Severe MR  3. Normal LV systolic function.  Recommendations: We will resume  Xarelto for anti-coagulation tomorrow. She will be referred to CT surgery for consideration of MV repair/replacement.  Complications: None; patient tolerated the procedure well.    Impression:  Patient has severe symptomatic mitral regurgitation with recently discovered persistent atrial fibrillation which has failed an attempt at DC cardioversion. Left ventricular systolic function appears preserved. Cardiac catheterization is notable for the absence of significant coronary artery disease and moderately elevated pulmonary artery pressures.  Images from the patient's transthoracic and transesophageal echocardiograms reveal a broad central jet of mitral regurgitation with somewhat thickened leaflets with restricted motion, particularly involving the posterior leaflet. There is significant posterior mitral annular calcification.  There is moderate tricuspid regurgitation and severe biatrial enlargement.  At the time the patient's original presentation in early April she had a significant episode of transient monocular blindness suspicious for TIA. Presumably this may have been related to thromboembolism, although the patient has not had MRI or MRA of the brain performed.   Carotid duplex scan was notable for the absence of significant extracranial carotid artery stenosis.   Plan:  The rationale for elective mitral valve repair surgery has been explained, including a comparison between surgery and continued medical therapy with close follow-up.  The likelihood of successful and durable valve repair has been discussed with particular reference to the findings of their recent echocardiogram.  Based upon these findings and previous experience, I have quoted them a greater than 50 percent likelihood of successful valve repair.  In the unlikely event that their valve cannot be successfully repaired, we discussed the possibility of replacing the mitral valve using a mechanical prosthesis with the attendant need for long-term anticoagulation versus the alternative of replacing it using a bioprosthetic tissue valve with its potential for late structural valve deterioration and failure, depending upon the patient's longevity.  The patient specifically requests that if the mitral valve must be replaced that it be done using a bioprosthetic tissue valve.  Alternative surgical approaches have been discussed, including a comparison between conventional sternotomy and minimally-invasive techniques.  The relative risks and benefits of each have been reviewed as they pertain to the patient's specific circumstances, and all of their questions have been addressed.  We will obtain MRI and MRA of the brain because of the patient's recent episode of transient monocular blindness. We'll obtain CT and to gram of the chest to rule out significant atherosclerotic disease or aneurysmal enlargement of the ascending thoracic aorta. We'll obtain pulmonary function test further stratify the patient's risk and see the patient back in followup in 2 weeks to review these tests and make final plans for surgery. That time we will also start the patient on amiodarone and make plans for stopping her anticoagulation  with Xarelto.     Salvatore Decent. Cornelius Moras, MD 09/24/2012 1:17 PM

## 2012-09-25 ENCOUNTER — Telehealth: Payer: Self-pay | Admitting: Cardiovascular Disease

## 2012-09-25 DIAGNOSIS — I4891 Unspecified atrial fibrillation: Secondary | ICD-10-CM

## 2012-09-25 MED ORDER — RIVAROXABAN 20 MG PO TABS: 20.0000 mg | ORAL_TABLET | Freq: Every day | ORAL | Status: DC

## 2012-09-25 NOTE — Telephone Encounter (Signed)
Follow up  ° ° ° ° °Pt is returning your call  °

## 2012-09-25 NOTE — Telephone Encounter (Signed)
No samples in office. Left message for pt to call back

## 2012-09-25 NOTE — Telephone Encounter (Signed)
Spoke with pt and told her we did not have any samples of Xarelto 20 mg in office.  Originally started by primary care. I told pt I would send prescription to her pharmacy--[facility].

## 2012-09-25 NOTE — Telephone Encounter (Signed)
New Prob     Pt requesting some more samples of XARELTO.

## 2012-09-27 ENCOUNTER — Ambulatory Visit (HOSPITAL_COMMUNITY)
Admission: RE | Admit: 2012-09-27 | Discharge: 2012-09-27 | Disposition: A | Discharge status: Home / Self Care | Payer: Medicare Other | Source: Ambulatory Visit | Attending: Thoracic Surgery (Cardiothoracic Vascular Surgery) | Admitting: Thoracic Surgery (Cardiothoracic Vascular Surgery)

## 2012-09-27 ENCOUNTER — Other Ambulatory Visit: Payer: Self-pay | Admitting: *Deleted

## 2012-09-27 ENCOUNTER — Ambulatory Visit (HOSPITAL_COMMUNITY): Payer: Medicare Other

## 2012-09-27 DIAGNOSIS — R42 Dizziness and giddiness: Secondary | ICD-10-CM

## 2012-09-27 DIAGNOSIS — I712 Thoracic aortic aneurysm, without rupture, unspecified: Secondary | ICD-10-CM

## 2012-09-27 DIAGNOSIS — I2584 Coronary atherosclerosis due to calcified coronary lesion: Secondary | ICD-10-CM | POA: Insufficient documentation

## 2012-09-27 DIAGNOSIS — R519 Headache, unspecified: Secondary | ICD-10-CM

## 2012-09-27 DIAGNOSIS — E041 Nontoxic single thyroid nodule: Secondary | ICD-10-CM | POA: Insufficient documentation

## 2012-09-27 DIAGNOSIS — I2510 Atherosclerotic heart disease of native coronary artery without angina pectoris: Secondary | ICD-10-CM | POA: Insufficient documentation

## 2012-09-27 DIAGNOSIS — I059 Rheumatic mitral valve disease, unspecified: Secondary | ICD-10-CM | POA: Insufficient documentation

## 2012-09-27 DIAGNOSIS — Z79899 Other long term (current) drug therapy: Secondary | ICD-10-CM | POA: Insufficient documentation

## 2012-09-27 DIAGNOSIS — K8020 Calculus of gallbladder without cholecystitis without obstruction: Secondary | ICD-10-CM | POA: Insufficient documentation

## 2012-09-27 DIAGNOSIS — R51 Headache: Secondary | ICD-10-CM | POA: Insufficient documentation

## 2012-09-27 DIAGNOSIS — I672 Cerebral atherosclerosis: Secondary | ICD-10-CM | POA: Insufficient documentation

## 2012-09-27 DIAGNOSIS — I4891 Unspecified atrial fibrillation: Secondary | ICD-10-CM | POA: Insufficient documentation

## 2012-09-27 LAB — PULMONARY FUNCTION TEST

## 2012-09-27 MED ORDER — GADOBENATE DIMEGLUMINE 529 MG/ML IV SOLN
17.0000 mL | Freq: Once | INTRAVENOUS | Status: AC
  Administered 2012-09-27: 17 mL via INTRAVENOUS

## 2012-09-27 MED ORDER — IOHEXOL 350 MG/ML SOLN
100.0000 mL | Freq: Once | INTRAVENOUS | Status: AC | PRN
  Administered 2012-09-27: 100 mL via INTRAVENOUS

## 2012-09-27 MED ORDER — ALBUTEROL SULFATE (5 MG/ML) 0.5% IN NEBU
2.5000 mg | INHALATION_SOLUTION | Freq: Once | RESPIRATORY_TRACT | Status: AC
  Administered 2012-09-27: 2.5 mg via RESPIRATORY_TRACT

## 2012-10-02 ENCOUNTER — Telehealth: Payer: Self-pay | Admitting: *Deleted

## 2012-10-02 NOTE — Telephone Encounter (Signed)
Pt will need follow up appt with Dr. Clifton James in 4-6 weeks. I called pt to schedule. Left message to call back

## 2012-10-04 ENCOUNTER — Encounter: Payer: Self-pay | Admitting: Thoracic Surgery (Cardiothoracic Vascular Surgery)

## 2012-10-04 ENCOUNTER — Other Ambulatory Visit: Payer: Self-pay | Admitting: *Deleted

## 2012-10-04 ENCOUNTER — Ambulatory Visit (INDEPENDENT_AMBULATORY_CARE_PROVIDER_SITE_OTHER): Payer: Medicare Other | Admitting: Thoracic Surgery (Cardiothoracic Vascular Surgery)

## 2012-10-04 VITALS — BP 141/79 | HR 76 | Resp 20 | Ht 66.5 in | Wt 172.0 lb

## 2012-10-04 DIAGNOSIS — I340 Nonrheumatic mitral (valve) insufficiency: Secondary | ICD-10-CM

## 2012-10-04 DIAGNOSIS — I079 Rheumatic tricuspid valve disease, unspecified: Secondary | ICD-10-CM

## 2012-10-04 DIAGNOSIS — I4891 Unspecified atrial fibrillation: Secondary | ICD-10-CM

## 2012-10-04 DIAGNOSIS — I712 Thoracic aortic aneurysm, without rupture, unspecified: Secondary | ICD-10-CM

## 2012-10-04 DIAGNOSIS — I059 Rheumatic mitral valve disease, unspecified: Secondary | ICD-10-CM

## 2012-10-04 DIAGNOSIS — I071 Rheumatic tricuspid insufficiency: Secondary | ICD-10-CM

## 2012-10-04 MED ORDER — AMIODARONE HCL 200 MG PO TABS: 200.0000 mg | ORAL_TABLET | Freq: Two times a day (BID) | ORAL | Status: DC

## 2012-10-04 NOTE — Patient Instructions (Addendum)
Stop taking Xarelto (blood thinner) and Lovastatin (mevacor) and begin taking Amiodarone (rhythm medication) between now and the time of surgery  Continue all other medications without change through the night before surgery  On the morning of surgery take only amiodarone, metoprolol and Prilosec with a sip of water

## 2012-10-04 NOTE — Progress Notes (Signed)
[address]       [place] [address]             [phone]     CARDIOTHORACIC SURGERY OFFICE NOTE  Referring Provider is Kathleene Hazel* PCP is Georgianne Fick, MD   HPI:  Patient returns for followup of severe symptomatic mitral regurgitation with persistent atrial fibrillation. She was originally seen in consultation on 09/24/2012. She states that over the last 2 weeks she feels like she has had continued gradual aggression of symptoms of exertional shortness of breath. She has had a persistent cough which is nonproductive. She has not had any fevers or chills. She's never had any chest pain. The remainder of her review of systems is unremarkable.   Current Outpatient Prescriptions  Medication Sig Dispense Refill  . calcium carbonate 200 MG capsule Calcium citrate  /vitamin D 630mg  (calicum)  1 tab daily      . glipiZIDE (GLUCOTROL XL) 10 MG 24 hr tablet 1 tab daily      . Insulin Isophane Human (HUMULIN N PEN [place]) Inject 100 Units into the skin. 12-15 units after dinner      . Lancets 28G MISC Lancets and test strips      . LEVEMIR FLEXPEN 100 UNIT/ML injection 100 Units. 40 units AM, 20 units pm      . losartan (COZAAR) 50 MG tablet 1 tab daily      . lovastatin (MEVACOR) 40 MG tablet 1 tab daily      . meloxicam (MOBIC) 7.5 MG tablet Take 1 tab daily      . metFORMIN (GLUCOPHAGE) 500 MG tablet Take 1,000 mg by mouth 2 (two) times daily with a meal.       . metoprolol (LOPRESSOR) 50 MG tablet Take 1/2 tab bid      . Multiple Vitamin (MULTIVITAMIN) tablet Take 1 tablet by mouth daily.      Marland Kitchen omeprazole (PRILOSEC) 40 MG capsule 1 tab daily      . PARoxetine (PAXIL) 30 MG tablet 1 tab daily      . amiodarone (PACERONE) 200 MG tablet Take 1 tablet (200 mg total) by mouth 2 (two) times daily. Begin 7 days prior to surgery.  30 tablet  0  . Rivaroxaban (XARELTO) 20 MG TABS Take 1 tablet (20 mg total) by mouth daily.  30 tablet  6   No current  facility-administered medications for this visit.      Physical Exam:   BP 141/79  Pulse 76  Resp 20  Ht 5' 6.5" (1.689 m)  Wt 172 lb (78.019 kg)  BMI 27.35 kg/m2  SpO2 98%  General:  Well-appearing  Chest:   Few respiratory crackles  CV:   Irregular rate and rhythm with systolic murmur  Incisions:  n/a  Abdomen:  Soft and nontender  Extremities:  Warm and well-perfused, no lower extremity edema  Diagnostic Tests:  CT ANGIOGRAPHY CHEST  Technique: Multidetector CT imaging of the chest using the  standard protocol during bolus administration of intravenous  contrast. Multiplanar reconstructed images including MIPs were  obtained and reviewed to evaluate the vascular anatomy.  Contrast: OMNIPAQUE IOHEXOL 350 MG/ML SOLN  Comparison: Chest radiograph 06/27/2012  Findings: No evidence for a pulmonary embolism. Heart size appears  to be mildly enlarged. Calcifications of the mitral annulus.  Calcifications involving the left anterior descending coronary  artery, left circumflex coronary artery and the left main coronary  artery. No significant pericardial or  pleural fluid. No evidence  for a thoracic aortic aneurysm. The ascending thoracic aorta  measures up to 3.3 cm. The great vessels are patent. Tortuosity of  the right innominate artery and mild narrowing near the origin of  the right common carotid artery. Difficult to exclude narrowing at  the junction of the left subclavian artery and left axillary artery  on image 14, sequence 4. Descending thoracic aorta measures 2.8 cm  at the level of the main pulmonary artery. The patient has had a  left mastectomy. The thyroid tissue is nodular with a focal 2 cm  nodule along the posterior aspect of the left thyroid lobe. No  significant chest lymphadenopathy. Small focus of presumed  enhancement along the posterior aspect of the liver is probably an  incidental vascular finding. No acute abnormality involving the  upper  abdominal structures. The celiac trunk, SMA and bilateral  renal arteries are patent. There is a density near the base of the  gallbladder that is suggestive for a stone or sludge.  The trachea and mainstem bronchi are patent. Incidentally, there  is an azygos lobe. Patchy areas of ground-glass opacity in both  lungs, predominantly in the lower lobes. Findings probably  represent atelectasis but cannot exclude mild edema. No focal  areas of consolidation. No acute bony abnormality.  IMPRESSION:  No evidence for thoracic aortic dissection or aneurysm.  Patchy ground-glass densities in both lungs is nonspecific but  probably associated with atelectasis.  Thyroid nodules. Dominant nodule in the inferior left thyroid  lobe. This could be further evaluated with ultrasound.  Coronary artery calcifications.  Cholelithiasis.  Original Report Authenticated By: Richarda Overlie, M.D.  MRI HEAD WITHOUT AND WITH CONTRAST  MRA HEAD WITHOUT CONTRAST  Technique: Multiplanar, multiecho pulse sequences of the brain and  surrounding structures were obtained without and with intravenous  contrast. Angiographic images of the head were obtained using MRA  technique without contrast.  Contrast: 17mL MULTIHANCE GADOBENATE DIMEGLUMINE 529 MG/ML IV SOLN  Comparison: CT head 08/09/2011 is unremarkable.  MRI HEAD WITHOUT AND WITH CONTRAST  Findings: No acute stroke or hemorrhage. No mass lesion or  hydrocephalus. No extra-axial fluid.  Mild atrophy. Mild chronic microvascular ischemic change. No foci  of chronic hemorrhage.  Major intracranial vascular structures widely patent. No worrisome  osseous findings. Normal midline structures. Mild pannus C1-C2.  Extracranial soft tissues including sinuses, orbits, and mastoids  are grossly unremarkable.  IMPRESSION:  Mild atrophy and small vessel disease. No acute intracranial  findings.   MRA HEAD  Findings: The internal carotid arteries are slightly irregular but    otherwise widely patent.  There is mild nonstenotic irregularity in the mid basilar, not flow  reducing. The left vertebral is dominant with mild distal disease  in the right vertebral. There is mild nonstenotic irregularity in  the proximal MCA and PCA vessels without flow limiting stenosis.  Both anterior cerebrals are patent. No cerebellar branch  occlusion. No intracranial aneurysm.  IMPRESSION:  Mild intracranial atherosclerotic disease without flow limiting  stenosis or visible aneurysm.  Original Report Authenticated By: Davonna Belling, M.D.   Pulmonary Function Tests  Baseline      Post-bronchodilator  FVC  2.33 L  (81% predicted) FVC  2.34 L  (81% predicted) FEV1  1.81 L  (84% predicted) FEV1  1.92 L  (89% predicted) FEF25-75 1.64 L  (94% predicted) FEF25-75 2.18 L  (125% predicted)  RV  1.54 L  (68% predicted) DLCO  61% predicted  Impression:  Patient has severe symptomatic mitral regurgitation with recently discovered persistent atrial fibrillation which has failed an attempt at DC cardioversion. Left ventricular systolic function appears preserved. Cardiac catheterization is notable for the absence of significant coronary artery disease and moderately elevated pulmonary artery pressures. Images from the patient's transthoracic and transesophageal echocardiograms reveal a broad central jet of mitral regurgitation with somewhat thickened leaflets with restricted motion, particularly involving the posterior leaflet. There is significant posterior mitral annular calcification. There is moderate tricuspid regurgitation and severe biatrial enlargement.  CT angiogram of the chest is notable for the absence of any significant aneurysmal enlargement of the ascending thoracic aorta. MRI and MRA of the brain were notable for the absence of any sign of recent stroke or intracranial cerebrovascular disease. Pulmonary function tests look remarkably good.   Plan:  I have again  reviewed the indications, risks, and potential benefits of surgery with the patient and 2 of her daughters here in the office today. We plan to proceed with elective mitral valve repair or replacement with tricuspid valve repair and Maze procedure via conventional sternotomy on Thursday, 10/11/2012. In the event that her mitral valve cannot be successfully repaired we will plan to replace it using a bioprosthetic tissue valve.  The patient and her daughters understand and accept all potential associated risks of surgery including but not limited to risk of death, stroke, myocardial infarction, congestive heart failure, respiratory failure, renal failure, bleeding requiring blood transfusion and/or reexploration, arrhythmia, heart block or bradycardia requiring permanent pacemaker, pneumonia, pleural effusion, wound infection, pulmonary embolus or other thromboembolic complication, chronic pain or other delayed complications related to valve repair or replacement.  The patient has been instructed to stop taking Xarelto and begin taking amiodarone in anticipation of surgery. The patient has also been instructed to temporarily stop taking lovastatin because of potential interactions with amiodarone.  All questions answered.    Salvatore Decent. Cornelius Moras, MD 10/04/2012 5:08 PM

## 2012-10-05 ENCOUNTER — Encounter (HOSPITAL_COMMUNITY): Payer: Self-pay | Admitting: Pharmacy Technician

## 2012-10-08 NOTE — H&P (Signed)
[address]       [place] [address]             [phone]          CARDIOTHORACIC SURGERY HISTORY AND PHYSICAL EXAM  Referring Provider is Verne Carrow D*MD PCP is Georgianne Fick, MD    Chief Complaint   Patient presents with   .  Mitral Regurgitation       TEE 07/31/12  CATHED  09/05/12     HPI:  Patient is a 72 year old divorced white female from [place] who reports that she was first told she had a heart murmur many years ago. She denies any known history of rheumatic fever or rheumatic heart disease, and she states that when she was evaluated for possible heart disease in the distant past she was told that there was nothing significant to be concerned about.she has been followed for several years by her primary care physician with known history of hypertension, type 2 diabetes mellitus, and osteoarthritis. She describes a gradual progression of symptoms of exertional shortness of breath and fatigue over the last few years. In recent months she has developed more significant exertional shortness of breath with occasional episodes of resting shortness of breath.  In early April the patient had a brief episode of transient monocular blindness that lasted less than 30 minutes. She initially went to her ophthalmologist who was concerned about a possible TIA and subsequently referred her to her primary care physician, Dr. Nicholos Johns.  She was noted to be in atrial fibrillation at that time, and she was started on Xarelto and metoprolol and referred for cardiology consultation.  She underwent 2D echocardiogram and was evaluated by Dr.McAlhany on 07/03/2012.  Transthoracic echocardiogram demonstrated the presence of normal left ventricular size and systolic function with severe mitral regurgitation and moderate tricuspid regurgitation.  A carotid duplex scan was performed revealing no significant extracranial carotid artery stenosis.  She was scheduled for  elective transesophageal echocardiogram with DC cardioversion on 07/31/2012. Transesophageal echocardiogram confirmed the presence of severe mitral regurgitation. The patient was cardioverted to sinus rhythm but had are equal and back into persistent atrial fibrillation by the time of her next office visit on 08/16/2012. She subsequently underwent left and right heart catheterization earlier this month revealing mild non-obstructive coronary artery disease with severe mitral regurgitation and preserved LV systolic function.  She was subsequently referred for elective surgical consultation.  The patient describes a long history of progressive exertional shortness of breath which has gotten considerably worse over the last few months.  She was originally seen in consultation on 09/24/2012. She states that over the last 2 weeks she feels like she has had continued gradual aggression of symptoms of exertional shortness of breath.  She now get short of breath with relatively mild activity, and she has occasional episodes of resting shortness of breath. She denies any PND, orthopnea, or lower extremity edema. She has occasional mild dizzy spells without syncope. She has occasional mild very transient episodes of tightness across her chest.  She has not had any other transient neurologic events other than the episode of transient left sided monocular blindness which occurred in early April.      Past Medical History  Diagnosis Date  . Diabetes mellitus   . Arthritis   . Hypertension   . Atrial fibrillation   . Mitral regurgitation   . GERD (gastroesophageal reflux disease)   . Cataracts, bilateral   . PONV (postoperative nausea  and vomiting)   . Tricuspid regurgitation     Past Surgical History  Procedure Laterality Date  . Mastectomy Left 1982  . Cesarean section      x 2  . Tee without cardioversion N/A 07/31/2012    Procedure: TRANSESOPHAGEAL ECHOCARDIOGRAM (TEE);  Surgeon: Wendall Stade, MD;   Location: [facility] ENDOSCOPY;  Service: Cardiovascular;  Laterality: N/A;  . Cardioversion N/A 07/31/2012    Procedure: CARDIOVERSION;  Surgeon: Wendall Stade, MD;  Location: [facility] ENDOSCOPY;  Service: Cardiovascular;  Laterality: N/A;    Family History  Problem Relation Age of Onset  . Cancer Father   . Heart attack Paternal Grandfather     Social History History  Substance Use Topics  . Smoking status: Former Smoker    Types: Cigarettes    Quit date: 09/25/2006  . Smokeless tobacco: Not on file  . Alcohol Use: No    Prior to Admission medications   Medication Sig Start Date End Date Taking? Authorizing Provider  acetaminophen (TYLENOL) 500 MG tablet Take 1,000 mg by mouth every 6 (six) hours as needed for pain.   Yes Historical Provider, MD  amiodarone (PACERONE) 200 MG tablet Take 1 tablet (200 mg total) by mouth 2 (two) times daily. Begin 7 days prior to surgery. 10/04/12  Yes Purcell Nails, MD  calcium carbonate 200 MG capsule Calcium citrate  /vitamin D 630mg  (calicum)  1 tab daily   Yes Historical Provider, MD  glipiZIDE (GLUCOTROL XL) 10 MG 24 hr tablet Take 10 mg by mouth daily.  06/07/12  Yes Historical Provider, MD  insulin lispro (HUMALOG KWIKPEN) 100 UNIT/ML SOPN Inject 12-15 Units into the skin daily. After dinner.   Yes Historical Provider, MD  Lancets 28G MISC Lancets and test strips 06/25/12  Yes Historical Provider, MD  LEVEMIR FLEXPEN 100 UNIT/ML injection Inject 20-40 Units into the skin 2 (two) times daily. 40 units AM, 20 units pm 06/25/12  Yes Historical Provider, MD  losartan (COZAAR) 50 MG tablet Take 50 mg by mouth daily.  05/30/12  Yes Historical Provider, MD  meloxicam (MOBIC) 7.5 MG tablet Take 7.5 mg by mouth daily.  06/28/12  Yes Historical Provider, MD  metFORMIN (GLUCOPHAGE) 500 MG tablet Take 1,000 mg by mouth 2 (two) times daily with a meal.  09/20/12  Yes Historical Provider, MD  metoprolol (LOPRESSOR) 50 MG tablet Take 25 mg by mouth 2 (two) times daily.  06/27/12   Yes Historical Provider, MD  Multiple Vitamin (MULTIVITAMIN) tablet Take 1 tablet by mouth daily.   Yes Historical Provider, MD  omeprazole (PRILOSEC) 40 MG capsule Take 40 mg by mouth daily.  06/28/12  Yes Historical Provider, MD  PARoxetine (PAXIL) 30 MG tablet Take 30 mg by mouth daily.  06/28/12  Yes Historical Provider, MD    Allergies  Allergen Reactions  . Ultram (Tramadol) Nausea And Vomiting     Review of Systems:              General:                      normal appetite, normal energy, no weight gain, no weight loss, no fever             Cardiac:                      no chest pain with exertion, no chest pain at rest, + SOB with mild exertion, + occasional resting SOB, no  PND, no orthopnea, no palpitations, + arrhythmia, + atrial fibrillation, no LE edema, + dizzy spells, no syncope             Respiratory:                + shortness of breath, no home oxygen, no productive cough, no dry cough, no bronchitis, no wheezing, no hemoptysis, no asthma, no pain with inspiration or cough, no sleep apnea, no CPAP at night             GI:                                no difficulty swallowing, no reflux, no frequent heartburn, + hiatal hernia, no abdominal pain, no constipation, no diarrhea, no hematochezia, no hematemesis, no melena             GU:                              no dysuria,  no frequency, no urinary tract infection, no hematuria, no kidney stones, no kidney disease             Vascular:                     no pain suggestive of claudication, no pain in feet, no leg cramps, + varicose veins, + DVT, no non-healing foot ulcer             Neuro:                         no stroke, + TIA's, no seizures, no headaches, + temporary blindness one eye,  no slurred speech, no peripheral neuropathy, + chronic pain in right knee, + instability of gait, + memory/cognitive dysfunction including progressive short term memory             Musculoskeletal:         + arthritis both knees R>L, no joint  swelling, no myalgias, mild difficulty walking, decreased mobility               Skin:                            no rash, no itching, no skin infections, no pressure sores or ulcerations             Psych:                         + anxiety, + depression, + nervousness, + unusual recent stress             Eyes:                           no blurry vision, no floaters, no recent vision changes, no wears glasses or contacts             ENT:                            no hearing loss, no loose or painful teeth, + dentures             Hematologic:               +  easy bruising, no abnormal bleeding, no clotting disorder, no frequent epistaxis             Endocrine:                   + diabetes, routinely checks CBG's at home                           Physical Exam:              BP 115/58  Pulse 56  Resp 16  Ht 5' 6.5" (1.689 m)  Wt 172 lb (78.019 kg)  BMI 27.35 kg/m2  SpO2 95%             General:                      Elderly and mildly frail-appearing             HEENT:                       Unremarkable               Neck:                           no JVD, no bruits, no adenopathy               Chest:                         clear to auscultation, symmetrical breath sounds, no wheezes, no rhonchi, previous left mastectomy               CV:                              RRR, + II/VI systolic murmur               Abdomen:                    soft, non-tender, no masses               Extremities:                 warm, well-perfused, pulses diminished on right, no LE edema             Rectal/GU                   Deferred             Neuro:                         Grossly non-focal and symmetrical throughout             Skin:                            Clean and dry, no rashes, no breakdown   Diagnostic Tests:    Transthoracic Echocardiography  Patient: Carmelia, Tiner MR #: [id number] Study Date: 06/29/2012 Gender: F Age: 61 Height: 167.6cm Weight: 78kg BSA: 1.54m^2 Pt.  Status: Room:  ATTENDING Carolann Littler, Ajith REFERRING Nicholos Johns, Ajith PERFORMING [facility], [facility] SONOGRAPHER Philomena Course, RDCS cc:  ------------------------------------------------------------ LV EF: 55% - 60%  ------------------------------------------------------------  Indications: Atrial fibrillation - 427.31. TIA 435.9.  ------------------------------------------------------------ History: PMH: Breast cancer, left mastectomy. Acquired from the patient and from the patient's chart. Atrial fibrillation. Transient ischemic attack. Risk factors: Hypertension. Diabetes mellitus. Dyslipidemia.  ------------------------------------------------------------ Study Conclusions  - Left ventricle: The cavity size was normal. Wall thickness was increased in a pattern of mild LVH. Systolic function was normal. The estimated ejection fraction was in the range of 55% to 60%. Wall motion was normal; there were no regional wall motion abnormalities. - Mitral valve: Calcified annulus. Severe regurgitation. - Left atrium: The atrium was moderately dilated. - Right atrium: The atrium was mildly dilated. - Tricuspid valve: Moderate regurgitation. - Pulmonary arteries: Systolic pressure was mildly increased. PA peak pressure: 40mm Hg (S). Transthoracic echocardiography. M-mode, complete 2D, spectral Doppler, and color Doppler. Height: Height: 167.6cm. Height: 66in. Weight: Weight: 78kg. Weight: 171.6lb. Body mass index: BMI: 27.8kg/m^2. Body surface area: BSA: 1.46m^2. Blood pressure: 109/63. Patient status: Outpatient. Location: [facility]  ------------------------------------------------------------  ------------------------------------------------------------ Left ventricle: The cavity size was normal. Wall thickness was increased in a pattern of mild LVH. Systolic function was normal. The estimated ejection fraction was in the range of 55%  to 60%. Wall motion was normal; there were no regional wall motion abnormalities. The study was not technically sufficient to allow evaluation of LV diastolic dysfunction due to atrial fibrillation.  ------------------------------------------------------------ Aortic valve: Trileaflet; mildly calcified leaflets. Sclerosis without stenosis. Doppler: No regurgitation.  ------------------------------------------------------------ Aorta: Aortic root: The aortic root was normal in size.  ------------------------------------------------------------ Mitral valve: Calcified annulus. Leaflet separation was normal. Doppler: Transvalvular velocity was within the normal range. There was no evidence for stenosis. Severe regurgitation.  ------------------------------------------------------------ Left atrium: The atrium was moderately dilated.  ------------------------------------------------------------ Right ventricle: The cavity size was normal. Systolic function was normal.  ------------------------------------------------------------ Pulmonic valve: Structurally normal valve. Cusp separation was normal. Doppler: Transvalvular velocity was within the normal range. Trivial regurgitation.  ------------------------------------------------------------ Tricuspid valve: Structurally normal valve. Leaflet separation was normal. Doppler: Transvalvular velocity was within the normal range. Moderate regurgitation.  ------------------------------------------------------------ Pulmonary artery: Systolic pressure was mildly increased.  ------------------------------------------------------------ Right atrium: The atrium was mildly dilated.  ------------------------------------------------------------ Pericardium: There was no pericardial effusion.  ------------------------------------------------------------ Systemic veins: Inferior vena cava: The vessel was normal in size; the respirophasic  diameter changes were in the normal range (= 50%); findings are consistent with normal central venous pressure.  ------------------------------------------------------------  2D measurements Normal Doppler measurements Normal Left ventricle Main pulmonary LVID ED, 33.6 mm 43-52 artery chord, Pressure, 40 mm Hg =30 PLAX S LVID ES, 24.5 mm 23-38 LVOT chord, Peak vel, 84.2 cm/s ------ PLAX S FS, chord, 27 % >29 VTI, S 17.8 cm ------ PLAX Stroke 61.7 ml ------ LVPW, ED 12.9 mm ------ vol IVS/LVPW 1.06 <1.3 Stroke 32.8 ml/m^2 ------ ratio, ED index Ventricular septum Mitral valve IVS, ED 13.7 mm ------ Regurg 57 cm/s ------ LVOT alias Diam, S 21 mm ------ vel, PISA Area 3.46 cm^2 ------ Max 518 cm/s ------ Diam 21 mm ------ regurg Aorta vel Root diam, 29 mm ------ Regurg 160 cm ------ ED VTI Left atrium ERO, PISA 0.25 cm^2 ------ AP dim 52 mm ------ Regurg 40 ml ------ AP dim 2.77 cm/m^2 <2.2 vol, PISA index RF, PISA 39.35 % ------ Tricuspid valve Regurg 295 cm/s ------ peak vel Peak 35 mm Hg ------ RV-RA gradient, S Systemic veins Estimated 5 mm Hg ------ CVP Right ventricle Pressure, 40 mm Hg <30 S  ------------------------------------------------------------ Prepared and Electronically Authenticated by  Olga Millers  2014-04-04T16:25:[id number]           Transesophageal Echocardiography  Patient: Merle, Whitehorn MR #: [id number] Study Date: 07/31/2012 Gender: F Age: 80 Height: 167.6cm Weight: 78.2kg BSA: 1.15m^2 Pt. Status: Room: [facility]  ADMITTING Charlton Haws ATTENDING Charlton Haws PERFORMING Charlton Haws SONOGRAPHER Perley Jain, RDCS ORDERING Clifton James, Christopher Myna Hidalgo, Christopher cc:  ------------------------------------------------------------ LV EF: 55% - 60%  ------------------------------------------------------------ Indications: Atrial fibrillation -  427.31.  ------------------------------------------------------------ History: PMH: Severe mitral regurgitation.  ------------------------------------------------------------ Study Conclusions  - Left ventricle: The cavity size was normal. Hypertrophy was noted. Systolic function was normal. The estimated ejection fraction was in the range of 55% to 60%. - Mitral valve: Severe central MR - Left atrium: The atrium was severely dilated. - Right atrium: The atrium was moderately to severely dilated. - Atrial septum: There was a patent foramen ovale. - Tricuspid valve: Moderate-severe regurgitation. - Impressions: No LAA thrombus. See CV procedure note regarding [facility] Impressions:  - No LAA thrombus. See CV procedure note regarding [facility] Transesophageal echocardiography. 2D and color Doppler. Height: Height: 167.6cm. Height: 66in. Weight: Weight: 78.2kg. Weight: 172lb. Body mass index: BMI: 27.8kg/m^2. Body surface area: BSA: 1.22m^2. Blood pressure: 105/47. Patient status: Outpatient. Location: Endoscopy.  ------------------------------------------------------------  ------------------------------------------------------------ Left ventricle: The cavity size was normal. Hypertrophy was noted. Systolic function was normal. The estimated ejection fraction was in the range of 55% to 60%.  ------------------------------------------------------------ Aortic valve: Mildly calcified leaflets.  ------------------------------------------------------------ Aorta: The aorta was normal, not dilated, and non-diseased.  ------------------------------------------------------------ Mitral valve: Severe central MR  ------------------------------------------------------------ Left atrium: The atrium was severely dilated.  ------------------------------------------------------------ Atrial septum: There was a patent foramen  ovale.  ------------------------------------------------------------ Right ventricle: The cavity size was normal. Wall thickness was normal. Systolic function was normal.  ------------------------------------------------------------ Tricuspid valve: Doppler: Moderate-severe regurgitation.  ------------------------------------------------------------ Right atrium: The atrium was moderately to severely dilated.  ------------------------------------------------------------ Pericardium: The pericardium was normal in appearance. There was no pericardial effusion.  ------------------------------------------------------------ Post procedure conclusions Ascending Aorta:  - The aorta was normal, not dilated, and non-diseased.  ------------------------------------------------------------  Doppler measurements Normal Mitral valve Regurg 38.5 cm/s ------ alias vel, PISA  ------------------------------------------------------------ Prepared and Electronically Authenticated by  Charlton Haws 2014-05-12T12:51:33.807    Cardiac Catheterization Operative Report    Demetrica Zipp Letizia   [id number]   6/11/20149:19 AM   Georgianne Fick, MD   Procedure Performed:  1. Left Heart Catheterization 2. Selective Coronary Angiography 3. Right Heart Catheterization 4. Left ventricular angiogram 5. Distal aortogram Operator: Verne Carrow, MD  Indication: 72 yo female with history of DM, HTN, OA, atrial fibrillation, GERD and recent diagnosis of severe MR/TR who is here today for cardiac cath. I saw her as a new patient for evaluation of atrial fibrillation and severe MR on 07/03/12. She was diagnosed with atrial fibrillation on 06/27/12 in primary care and was started on Metoprolol 50 mg po BID for rate control and Xarelto 20 mg po Qdaily. Echo with normal LV size and function severe MR, moderate TR. We planned on 3 more weeks of anti-coagulation before consideration for a cardioversion.  She had a TEE guided cardioversion on Jul 31, 2012 with return to sinus rhythm. Her MR was noted to be central and severe. The LV cavity was not dilated. LV systolic function was normal. There was moderate to severe TR as well. She converted back to atrial fibrillation. She continues to have exertional chest pressure and dyspnea. No syncope or near syncope. Right and left heart cath today to exclude obstructive CAD and assess  filling pressures.   Procedure Details:   The risks, benefits, complications, treatment options, and expected outcomes were discussed with the patient. The patient and/or family concurred with the proposed plan, giving informed consent. The patient was brought to the cath lab after IV hydration was begun and oral premedication was given. The patient was further sedated with Versed and Fentanyl. The patient was prepped and draped in the usual manner. Using the modified Seldinger access technique, a 5 French sheath was placed in the femoral artery. A 6 French sheath was inserted into the right femoral vein. A balloon tipped catheter was used to perform a right heart catheterization. Standard diagnostic catheters were used to perform selective coronary angiography. A pigtail catheter was used to perform a left ventricular angiogram. There were no immediate complications. The patient was taken to the recovery area in stable condition.   Hemodynamic Findings:   Ao: 123/62  LV: 124/13/5  RA: 6  RV: 36/4/9  PA: 37/13 (mean 25)   PCWP: 12  Fick Cardiac Output: 3.8 L/min   Fick Cardiac Index: 2.0 L/min/m2   Central Aortic Saturation: 93%   Pulmonary Artery Saturation: 56%   Angiographic Findings:   Left main: No obstructive disease.   Left Anterior Descending Artery: Moderate caliber that reaches the apex but quickly tapers to a small caliber vessel. There is mild plaque in the mid vessel. The diagonal branch is small in caliber with no obstructive disease.   Circumflex Artery: Moderate  caliber vessel with mild plaque in the proximal and mid vessel.   Right Coronary Artery: Small to moderate caliber dominant vessel with mild mid plaque.   Left Ventricular Angiogram: LVEF=65-70%. 2-3+ MR   Distal Aortogram: No aneurysm or obstructive disease noted. Bilateral renal arteries are patent.   Impression:  1. Mild non-obstructive CAD   2. Severe MR   3. Normal LV systolic function.   Recommendations: We will resume Xarelto for anti-coagulation tomorrow. She will be referred to CT surgery for consideration of MV repair/replacement.   Complications: None; patient tolerated the procedure well.   CT ANGIOGRAPHY CHEST  Technique: Multidetector CT imaging of the chest using the   standard protocol during bolus administration of intravenous   contrast. Multiplanar reconstructed images including MIPs were   obtained and reviewed to evaluate the vascular anatomy.   Contrast: OMNIPAQUE IOHEXOL 350 MG/ML SOLN   Comparison: Chest radiograph 06/27/2012   Findings: No evidence for a pulmonary embolism. Heart size appears   to be mildly enlarged. Calcifications of the mitral annulus.   Calcifications involving the left anterior descending coronary   artery, left circumflex coronary artery and the left main coronary   artery. No significant pericardial or pleural fluid. No evidence   for a thoracic aortic aneurysm. The ascending thoracic aorta   measures up to 3.3 cm. The great vessels are patent. Tortuosity of   the right innominate artery and mild narrowing near the origin of   the right common carotid artery. Difficult to exclude narrowing at   the junction of the left subclavian artery and left axillary artery   on image 14, sequence 4. Descending thoracic aorta measures 2.8 cm   at the level of the main pulmonary artery. The patient has had a   left mastectomy. The thyroid tissue is nodular with a focal 2 cm   nodule along the posterior aspect of the left thyroid lobe. No    significant chest lymphadenopathy. Small focus of presumed   enhancement along the  posterior aspect of the liver is probably an   incidental vascular finding. No acute abnormality involving the   upper abdominal structures. The celiac trunk, SMA and bilateral   renal arteries are patent. There is a density near the base of the   gallbladder that is suggestive for a stone or sludge.   The trachea and mainstem bronchi are patent. Incidentally, there   is an azygos lobe. Patchy areas of ground-glass opacity in both   lungs, predominantly in the lower lobes. Findings probably   represent atelectasis but cannot exclude mild edema. No focal   areas of consolidation. No acute bony abnormality.   IMPRESSION:   No evidence for thoracic aortic dissection or aneurysm.   Patchy ground-glass densities in both lungs is nonspecific but   probably associated with atelectasis.   Thyroid nodules. Dominant nodule in the inferior left thyroid   lobe. This could be further evaluated with ultrasound.   Coronary artery calcifications.   Cholelithiasis.   Original Report Authenticated By: Richarda Overlie, M.D.  MRI HEAD WITHOUT AND WITH CONTRAST   MRA HEAD WITHOUT CONTRAST  Technique: Multiplanar, multiecho pulse sequences of the brain and   surrounding structures were obtained without and with intravenous   contrast. Angiographic images of the head were obtained using MRA   technique without contrast.   Contrast: 17mL MULTIHANCE GADOBENATE DIMEGLUMINE 529 MG/ML IV SOLN   Comparison: CT head 08/09/2011 is unremarkable.   MRI HEAD WITHOUT AND WITH CONTRAST   Findings: No acute stroke or hemorrhage. No mass lesion or   hydrocephalus. No extra-axial fluid.   Mild atrophy. Mild chronic microvascular ischemic change. No foci   of chronic hemorrhage.   Major intracranial vascular structures widely patent. No worrisome   osseous findings. Normal midline structures. Mild pannus C1-C2.   Extracranial soft tissues  including sinuses, orbits, and mastoids   are grossly unremarkable.   IMPRESSION:   Mild atrophy and small vessel disease. No acute intracranial   findings.   MRA HEAD  Findings: The internal carotid arteries are slightly irregular but   otherwise widely patent.   There is mild nonstenotic irregularity in the mid basilar, not flow   reducing. The left vertebral is dominant with mild distal disease   in the right vertebral. There is mild nonstenotic irregularity in   the proximal MCA and PCA vessels without flow limiting stenosis.   Both anterior cerebrals are patent. No cerebellar branch   occlusion. No intracranial aneurysm.   IMPRESSION:   Mild intracranial atherosclerotic disease without flow limiting   stenosis or visible aneurysm.   Original Report Authenticated By: Davonna Belling, M.D.   Pulmonary Function Tests  Baseline                                                                      Post-bronchodilator  FVC                 2.33 L  (81% predicted)          FVC                 2.34 L  (81% predicted) FEV1               1.81 L  (  84% predicted)          FEV1               1.92 L  (89% predicted) FEF25-75        1.64 L  (94% predicted)          FEF25-75        2.18 L  (125% predicted)  RV                   1.54 L  (68% predicted) DLCO              61% predicted       Impression:  Patient has severe symptomatic mitral regurgitation with recently discovered persistent atrial fibrillation which has failed an attempt at DC cardioversion. Left ventricular systolic function appears preserved. Cardiac catheterization is notable for the absence of significant coronary artery disease and moderately elevated pulmonary artery pressures. Images from the patient's transthoracic and transesophageal echocardiograms reveal a broad central jet of mitral regurgitation with somewhat thickened leaflets with restricted motion, particularly involving the posterior leaflet. There is significant  posterior mitral annular calcification. There is moderate tricuspid regurgitation and severe biatrial enlargement.  CT angiogram of the chest is notable for the absence of any significant aneurysmal enlargement of the ascending thoracic aorta. MRI and MRA of the brain were notable for the absence of any sign of recent stroke or intracranial cerebrovascular disease. Pulmonary function tests look remarkably good.   Plan:  I have reviewed the indications, risks, and potential benefits of surgery with the patient and 2 of her daughters here in the office today. We plan to proceed with elective mitral valve repair or replacement with tricuspid valve repair and Maze procedure via conventional sternotomy on Thursday, 10/11/2012. In the event that her mitral valve cannot be successfully repaired we will plan to replace it using a bioprosthetic tissue valve.  The patient and her daughters understand and accept all potential associated risks of surgery including but not limited to risk of death, stroke, myocardial infarction, congestive heart failure, respiratory failure, renal failure, bleeding requiring blood transfusion and/or reexploration, arrhythmia, heart block or bradycardia requiring permanent pacemaker, pneumonia, pleural effusion, wound infection, pulmonary embolus or other thromboembolic complication, chronic pain or other delayed complications related to valve repair or replacement.  The patient has been instructed to stop taking Xarelto and begin taking amiodarone in anticipation of surgery. The patient has also been instructed to temporarily stop taking lovastatin because of potential interactions with amiodarone.  All questions answered.    Salvatore Decent. Cornelius Moras, MD 10/04/2012 5:08 PM

## 2012-10-08 NOTE — Telephone Encounter (Signed)
Spoke with pt and appt made for her to see Dr. Clifton James on November 09, 2012 at 9:00

## 2012-10-09 ENCOUNTER — Ambulatory Visit (HOSPITAL_COMMUNITY)
Admission: RE | Admit: 2012-10-09 | Discharge: 2012-10-09 | Disposition: A | Discharge status: Home / Self Care | Payer: Medicare Other | Source: Ambulatory Visit | Attending: Thoracic Surgery (Cardiothoracic Vascular Surgery) | Admitting: Thoracic Surgery (Cardiothoracic Vascular Surgery)

## 2012-10-09 ENCOUNTER — Encounter (HOSPITAL_COMMUNITY)
Admission: RE | Admit: 2012-10-09 | Discharge: 2012-10-09 | Disposition: A | Discharge status: Home / Self Care | Payer: Medicare Other | Source: Ambulatory Visit | Attending: Thoracic Surgery (Cardiothoracic Vascular Surgery) | Admitting: Thoracic Surgery (Cardiothoracic Vascular Surgery)

## 2012-10-09 ENCOUNTER — Encounter (HOSPITAL_COMMUNITY): Payer: Self-pay

## 2012-10-09 VITALS — BP 129/59 | HR 56 | Temp 98.0°F | Resp 20 | Ht 66.5 in | Wt 172.6 lb

## 2012-10-09 DIAGNOSIS — I519 Heart disease, unspecified: Secondary | ICD-10-CM | POA: Diagnosis not present

## 2012-10-09 DIAGNOSIS — I059 Rheumatic mitral valve disease, unspecified: Secondary | ICD-10-CM

## 2012-10-09 DIAGNOSIS — Z01812 Encounter for preprocedural laboratory examination: Secondary | ICD-10-CM | POA: Insufficient documentation

## 2012-10-09 DIAGNOSIS — Z8249 Family history of ischemic heart disease and other diseases of the circulatory system: Secondary | ICD-10-CM

## 2012-10-09 DIAGNOSIS — Z01818 Encounter for other preprocedural examination: Secondary | ICD-10-CM | POA: Insufficient documentation

## 2012-10-09 DIAGNOSIS — I4891 Unspecified atrial fibrillation: Secondary | ICD-10-CM | POA: Diagnosis present

## 2012-10-09 DIAGNOSIS — Z01810 Encounter for preprocedural cardiovascular examination: Secondary | ICD-10-CM

## 2012-10-09 DIAGNOSIS — I442 Atrioventricular block, complete: Secondary | ICD-10-CM | POA: Diagnosis not present

## 2012-10-09 DIAGNOSIS — I2510 Atherosclerotic heart disease of native coronary artery without angina pectoris: Secondary | ICD-10-CM | POA: Diagnosis present

## 2012-10-09 DIAGNOSIS — Z9851 Tubal ligation status: Secondary | ICD-10-CM

## 2012-10-09 DIAGNOSIS — H269 Unspecified cataract: Secondary | ICD-10-CM | POA: Diagnosis present

## 2012-10-09 DIAGNOSIS — H5440 Blindness, one eye, unspecified eye: Secondary | ICD-10-CM | POA: Diagnosis present

## 2012-10-09 DIAGNOSIS — I509 Heart failure, unspecified: Secondary | ICD-10-CM | POA: Diagnosis present

## 2012-10-09 DIAGNOSIS — I517 Cardiomegaly: Secondary | ICD-10-CM | POA: Insufficient documentation

## 2012-10-09 DIAGNOSIS — R5381 Other malaise: Secondary | ICD-10-CM | POA: Diagnosis not present

## 2012-10-09 DIAGNOSIS — R339 Retention of urine, unspecified: Secondary | ICD-10-CM | POA: Diagnosis not present

## 2012-10-09 DIAGNOSIS — D62 Acute posthemorrhagic anemia: Secondary | ICD-10-CM | POA: Diagnosis not present

## 2012-10-09 DIAGNOSIS — R51 Headache: Secondary | ICD-10-CM | POA: Diagnosis not present

## 2012-10-09 DIAGNOSIS — I10 Essential (primary) hypertension: Secondary | ICD-10-CM | POA: Diagnosis present

## 2012-10-09 DIAGNOSIS — I079 Rheumatic tricuspid valve disease, unspecified: Secondary | ICD-10-CM | POA: Diagnosis present

## 2012-10-09 DIAGNOSIS — J9819 Other pulmonary collapse: Secondary | ICD-10-CM | POA: Diagnosis not present

## 2012-10-09 DIAGNOSIS — E1169 Type 2 diabetes mellitus with other specified complication: Secondary | ICD-10-CM | POA: Diagnosis not present

## 2012-10-09 DIAGNOSIS — Z79899 Other long term (current) drug therapy: Secondary | ICD-10-CM

## 2012-10-09 DIAGNOSIS — I498 Other specified cardiac arrhythmias: Secondary | ICD-10-CM | POA: Diagnosis not present

## 2012-10-09 DIAGNOSIS — Z87891 Personal history of nicotine dependence: Secondary | ICD-10-CM

## 2012-10-09 DIAGNOSIS — Z794 Long term (current) use of insulin: Secondary | ICD-10-CM

## 2012-10-09 DIAGNOSIS — E8779 Other fluid overload: Secondary | ICD-10-CM | POA: Diagnosis not present

## 2012-10-09 DIAGNOSIS — Z8673 Personal history of transient ischemic attack (TIA), and cerebral infarction without residual deficits: Secondary | ICD-10-CM

## 2012-10-09 DIAGNOSIS — Z9010 Acquired absence of unspecified breast and nipple: Secondary | ICD-10-CM

## 2012-10-09 DIAGNOSIS — I5042 Chronic combined systolic (congestive) and diastolic (congestive) heart failure: Secondary | ICD-10-CM | POA: Diagnosis present

## 2012-10-09 DIAGNOSIS — M1990 Unspecified osteoarthritis, unspecified site: Secondary | ICD-10-CM | POA: Diagnosis present

## 2012-10-09 DIAGNOSIS — E785 Hyperlipidemia, unspecified: Secondary | ICD-10-CM | POA: Diagnosis present

## 2012-10-09 DIAGNOSIS — I6529 Occlusion and stenosis of unspecified carotid artery: Secondary | ICD-10-CM | POA: Insufficient documentation

## 2012-10-09 DIAGNOSIS — Z7982 Long term (current) use of aspirin: Secondary | ICD-10-CM

## 2012-10-09 DIAGNOSIS — I071 Rheumatic tricuspid insufficiency: Secondary | ICD-10-CM

## 2012-10-09 DIAGNOSIS — Z853 Personal history of malignant neoplasm of breast: Secondary | ICD-10-CM

## 2012-10-09 DIAGNOSIS — K219 Gastro-esophageal reflux disease without esophagitis: Secondary | ICD-10-CM | POA: Diagnosis present

## 2012-10-09 DIAGNOSIS — D696 Thrombocytopenia, unspecified: Secondary | ICD-10-CM | POA: Diagnosis not present

## 2012-10-09 HISTORY — DX: Shortness of breath: R06.02

## 2012-10-09 HISTORY — DX: Cerebral infarction, unspecified: I63.9

## 2012-10-09 LAB — COMPREHENSIVE METABOLIC PANEL
Alkaline Phosphatase: 62 U/L (ref 39–117)
BUN: 24 mg/dL — ABNORMAL HIGH (ref 6–23)
CO2: 19 mEq/L (ref 19–32)
GFR calc Af Amer: 80 mL/min — ABNORMAL LOW (ref >90)
GFR calc non Af Amer: 69 mL/min — ABNORMAL LOW (ref >90)
Glucose, Bld: 121 mg/dL — ABNORMAL HIGH (ref 70–99)
Potassium: 4.8 mEq/L (ref 3.5–5.1)
Total Bilirubin: 0.3 mg/dL (ref 0.3–1.2)
Total Protein: 6.5 g/dL (ref 6.0–8.3)

## 2012-10-09 LAB — PROTIME-INR: INR: 1.05 (ref 0.00–1.49)

## 2012-10-09 LAB — HEMOGLOBIN A1C: Hgb A1c MFr Bld: 6.3 % — ABNORMAL HIGH (ref <5.7)

## 2012-10-09 LAB — BLOOD GAS, ARTERIAL
Bicarbonate: 21.3 mEq/L (ref 20.0–24.0)
O2 Saturation: 97.9 %
Patient temperature: 98.6
TCO2: 22.4 mmol/L (ref 0–100)
pH, Arterial: 7.403 (ref 7.350–7.450)

## 2012-10-09 LAB — URINALYSIS, ROUTINE W REFLEX MICROSCOPIC
Hgb urine dipstick: NEGATIVE
Ketones, ur: NEGATIVE mg/dL
Protein, ur: NEGATIVE mg/dL
Urobilinogen, UA: 1 mg/dL (ref 0.0–1.0)

## 2012-10-09 LAB — URINE MICROSCOPIC-ADD ON

## 2012-10-09 LAB — CBC
HCT: 33.8 % — ABNORMAL LOW (ref 36.0–46.0)
Hemoglobin: 11.3 g/dL — ABNORMAL LOW (ref 12.0–15.0)
MCHC: 33.4 g/dL (ref 30.0–36.0)

## 2012-10-09 LAB — SURGICAL PCR SCREEN: MRSA, PCR: NEGATIVE

## 2012-10-09 LAB — ABO/RH: ABO/RH(D): A NEG

## 2012-10-09 MED ORDER — CHLORHEXIDINE GLUCONATE 4 % EX LIQD: 30.0000 mL | CUTANEOUS | Status: DC

## 2012-10-09 NOTE — Progress Notes (Signed)
VASCULAR LAB PRELIMINARY  PRELIMINARY  PRELIMINARY  PRELIMINARY  Pre-op Cardiac Surgery  Carotid Findings:  Done 07-05-12 at [facility].  Results:  Bilateral:  Less than 39% ICA stenosis.  Vertebral artery flow is antegrade.      Upper Extremity Right Left  Brachial Pressures 140 Triphasic  130 Triphasic   Radial Waveforms Triphasic  Triphasic   Ulnar Waveforms Triphasic  Triphasic   Palmar Arch (Allen's Test) Within normal limits  Within normal limits      Ron Beske, RVT 10/09/2012, 4:00 PM

## 2012-10-09 NOTE — Pre-Procedure Instructions (Signed)
Rachel Santos  10/09/2012    Your procedure is scheduled on:  Thursday October 11, 2012  Report to [facility] at 0530 AM.  Call this number if you have problems the morning of surgery: [phone]   Remember:   Do not eat food or drink liquids after midnight.   Take these medicines the morning of surgery with A SIP OF WATER:Amiodarone, Metoprolol, Prilosec, and Paxil. No diabetic meds morning of surgery.   Do not wear jewelry, make-up or nail polish.  Do not wear lotions, powders, or perfumes. You may wear deodorant.  Do not shave 48 hours prior to surgery.  Do not bring valuables to the hospital.  [facility] is not responsible for any belongings or valuables.  Contacts, dentures or bridgework may not be worn into surgery.  Leave suitcase in the car. After surgery it may be brought to your room.  For patients admitted to the hospital, checkout time is 11:00 AM the day of  discharge.   Patients discharged the day of surgery will not be allowed to drive home.    Special Instructions: Shower using CHG 2 nights before surgery and the night before surgery.  If you shower the day of surgery use CHG.  Use special wash - you have one bottle of CHG for all showers.  You should use approximately 1/3 of the bottle for each shower.   Please read over the following fact sheets that you were given: Pain Booklet, Coughing and Deep Breathing, Blood Transfusion Information, MRSA Information and Surgical Site Infection Prevention

## 2012-10-10 MED ORDER — SODIUM CHLORIDE 0.9 % IV SOLN
INTRAVENOUS | Status: DC
  Filled 2012-10-10: qty 40

## 2012-10-10 MED ORDER — VANCOMYCIN HCL 10 G IV SOLR
1250.0000 mg | INTRAVENOUS | Status: AC
  Administered 2012-10-11: 1250 mg via INTRAVENOUS
  Filled 2012-10-10 (×2): qty 1250

## 2012-10-10 MED ORDER — MAGNESIUM SULFATE 50 % IJ SOLN
40.0000 meq | INTRAMUSCULAR | Status: DC
  Filled 2012-10-10: qty 10

## 2012-10-10 MED ORDER — NITROGLYCERIN IN D5W 200-5 MCG/ML-% IV SOLN
2.0000 ug/min | INTRAVENOUS | Status: DC
  Filled 2012-10-10: qty 250

## 2012-10-10 MED ORDER — DEXTROSE 5 % IV SOLN
1.5000 g | INTRAVENOUS | Status: AC
  Administered 2012-10-11: 1.5 g via INTRAVENOUS
  Administered 2012-10-11: .75 g via INTRAVENOUS
  Filled 2012-10-10 (×2): qty 1.5

## 2012-10-10 MED ORDER — SODIUM CHLORIDE 0.9 % IV SOLN
INTRAVENOUS | Status: DC
  Filled 2012-10-10: qty 30

## 2012-10-10 MED ORDER — DEXMEDETOMIDINE HCL IN NACL 400 MCG/100ML IV SOLN
0.1000 ug/kg/h | INTRAVENOUS | Status: DC
  Filled 2012-10-10: qty 100

## 2012-10-10 MED ORDER — EPINEPHRINE HCL 1 MG/ML IJ SOLN
0.5000 ug/min | INTRAVENOUS | Status: DC
  Filled 2012-10-10: qty 4

## 2012-10-10 MED ORDER — METOPROLOL TARTRATE 12.5 MG HALF TABLET: 12.5000 mg | ORAL_TABLET | Freq: Once | ORAL | Status: DC

## 2012-10-10 MED ORDER — DEXTROSE 5 % IV SOLN
750.0000 mg | INTRAVENOUS | Status: DC
  Filled 2012-10-10: qty 750

## 2012-10-10 MED ORDER — DOPAMINE-DEXTROSE 3.2-5 MG/ML-% IV SOLN
2.0000 ug/kg/min | INTRAVENOUS | Status: DC
  Filled 2012-10-10: qty 250

## 2012-10-10 MED ORDER — DEXTROSE 5 % IV SOLN
30.0000 ug/min | INTRAVENOUS | Status: DC
  Filled 2012-10-10: qty 2

## 2012-10-10 MED ORDER — SODIUM CHLORIDE 0.9 % IV SOLN
INTRAVENOUS | Status: DC
  Filled 2012-10-10: qty 1

## 2012-10-10 MED ORDER — VANCOMYCIN HCL 1000 MG IV SOLR
INTRAVENOUS | Status: AC
  Administered 2012-10-11: 07:00:00
  Filled 2012-10-10: qty 1000

## 2012-10-10 MED ORDER — PLASMA-LYTE 148 IV SOLN
INTRAVENOUS | Status: AC
  Administered 2012-10-11: 07:00:00
  Filled 2012-10-10: qty 2.5

## 2012-10-10 MED ORDER — POTASSIUM CHLORIDE 2 MEQ/ML IV SOLN
80.0000 meq | INTRAVENOUS | Status: DC
  Filled 2012-10-10: qty 40

## 2012-10-11 ENCOUNTER — Encounter (HOSPITAL_COMMUNITY)
Admission: RE | Disposition: A | Discharge status: Home Health | Payer: Self-pay | Source: Ambulatory Visit | Attending: Thoracic Surgery (Cardiothoracic Vascular Surgery)

## 2012-10-11 ENCOUNTER — Encounter (HOSPITAL_COMMUNITY): Payer: Self-pay | Admitting: Anesthesiology

## 2012-10-11 ENCOUNTER — Inpatient Hospital Stay (HOSPITAL_COMMUNITY): Payer: Medicare Other

## 2012-10-11 ENCOUNTER — Inpatient Hospital Stay (HOSPITAL_COMMUNITY)
Admission: RE | Admit: 2012-10-11 | Discharge: 2012-10-23 | DRG: 220 | Disposition: A | Discharge status: Home Health | Payer: Medicare Other | Source: Ambulatory Visit | Attending: Thoracic Surgery (Cardiothoracic Vascular Surgery) | Admitting: Thoracic Surgery (Cardiothoracic Vascular Surgery)

## 2012-10-11 ENCOUNTER — Inpatient Hospital Stay (HOSPITAL_COMMUNITY): Payer: Medicare Other | Admitting: Anesthesiology

## 2012-10-11 ENCOUNTER — Encounter (HOSPITAL_COMMUNITY): Payer: Self-pay | Admitting: *Deleted

## 2012-10-11 DIAGNOSIS — I079 Rheumatic tricuspid valve disease, unspecified: Secondary | ICD-10-CM

## 2012-10-11 DIAGNOSIS — I059 Rheumatic mitral valve disease, unspecified: Secondary | ICD-10-CM

## 2012-10-11 DIAGNOSIS — Z9889 Other specified postprocedural states: Secondary | ICD-10-CM

## 2012-10-11 DIAGNOSIS — I071 Rheumatic tricuspid insufficiency: Secondary | ICD-10-CM

## 2012-10-11 DIAGNOSIS — I4891 Unspecified atrial fibrillation: Secondary | ICD-10-CM

## 2012-10-11 DIAGNOSIS — Z8679 Personal history of other diseases of the circulatory system: Secondary | ICD-10-CM

## 2012-10-11 HISTORY — PX: TRICUSPID VALVE REPLACEMENT: SHX816

## 2012-10-11 HISTORY — PX: MITRAL VALVE REPAIR: SHX2039

## 2012-10-11 HISTORY — PX: INTRAOPERATIVE TRANSESOPHAGEAL ECHOCARDIOGRAM: SHX5062

## 2012-10-11 HISTORY — DX: Other specified postprocedural states: Z98.890

## 2012-10-11 HISTORY — DX: Personal history of other diseases of the circulatory system: Z86.79

## 2012-10-11 HISTORY — PX: MAZE: SHX5063

## 2012-10-11 LAB — POCT I-STAT 3, ART BLOOD GAS (G3+)
Acid-base deficit: 6 mmol/L — ABNORMAL HIGH (ref 0.0–2.0)
Acid-base deficit: 6 mmol/L — ABNORMAL HIGH (ref 0.0–2.0)
Acid-base deficit: 4 mmol/L — ABNORMAL HIGH (ref 0.0–2.0)
O2 Saturation: 84 %
O2 Saturation: 91 %
O2 Saturation: 89 %
Patient temperature: 35.7
Patient temperature: 35.8
Patient temperature: 37.1
TCO2: 23 mmol/L (ref 0–100)
pCO2 arterial: 36.7 mmHg (ref 35.0–45.0)
pO2, Arterial: 297 mmHg — ABNORMAL HIGH (ref 80.0–100.0)
pO2, Arterial: 50 mmHg — ABNORMAL LOW (ref 80.0–100.0)
pO2, Arterial: 62 mmHg — ABNORMAL LOW (ref 80.0–100.0)

## 2012-10-11 LAB — POCT I-STAT 4, (NA,K, GLUC, HGB,HCT)
Glucose, Bld: 89 mg/dL (ref 70–99)
Glucose, Bld: 71 mg/dL (ref 70–99)
Glucose, Bld: 190 mg/dL — ABNORMAL HIGH (ref 70–99)
HCT: 30 % — ABNORMAL LOW (ref 36.0–46.0)
HCT: 21 % — ABNORMAL LOW (ref 36.0–46.0)
HCT: 22 % — ABNORMAL LOW (ref 36.0–46.0)
HCT: 41 % (ref 36.0–46.0)
Hemoglobin: 7.1 g/dL — ABNORMAL LOW (ref 12.0–15.0)
Hemoglobin: 9.5 g/dL — ABNORMAL LOW (ref 12.0–15.0)
Hemoglobin: 7.5 g/dL — ABNORMAL LOW (ref 12.0–15.0)
Potassium: 5 mEq/L (ref 3.5–5.1)
Potassium: 5.6 mEq/L — ABNORMAL HIGH (ref 3.5–5.1)
Potassium: 5.2 mEq/L — ABNORMAL HIGH (ref 3.5–5.1)
Sodium: 141 mEq/L (ref 135–145)
Sodium: 137 mEq/L (ref 135–145)
Sodium: 139 mEq/L (ref 135–145)

## 2012-10-11 LAB — POCT I-STAT, CHEM 8
BUN: 15 mg/dL (ref 6–23)
Calcium, Ion: 1.07 mmol/L — ABNORMAL LOW (ref 1.13–1.30)
Creatinine, Ser: 0.7 mg/dL (ref 0.50–1.10)
TCO2: 22 mmol/L (ref 0–100)

## 2012-10-11 LAB — GLUCOSE, CAPILLARY: Glucose-Capillary: 50 mg/dL — ABNORMAL LOW (ref 70–99)

## 2012-10-11 LAB — URINE CULTURE: Colony Count: NO GROWTH

## 2012-10-11 LAB — CREATININE, SERUM: GFR calc non Af Amer: 84 mL/min — ABNORMAL LOW (ref >90)

## 2012-10-11 LAB — CBC
Hemoglobin: 13.5 g/dL (ref 12.0–15.0)
MCV: 83 fL (ref 78.0–100.0)
Platelets: 94 10*3/uL — ABNORMAL LOW (ref 150–400)
RBC: 4.8 MIL/uL (ref 3.87–5.11)
RDW: 15.5 % (ref 11.5–15.5)
WBC: 12.9 10*3/uL — ABNORMAL HIGH (ref 4.0–10.5)
WBC: 10.5 10*3/uL (ref 4.0–10.5)

## 2012-10-11 LAB — PROTIME-INR
INR: 1.47 (ref 0.00–1.49)
Prothrombin Time: 17.4 seconds — ABNORMAL HIGH (ref 11.6–15.2)

## 2012-10-11 LAB — PLATELET COUNT: Platelets: 105 10*3/uL — ABNORMAL LOW (ref 150–400)

## 2012-10-11 LAB — MAGNESIUM: Magnesium: 2.5 mg/dL (ref 1.5–2.5)

## 2012-10-11 SURGERY — TRICUSPID VALVE REPAIR
Anesthesia: General | Site: Chest | Wound class: Clean

## 2012-10-11 MED ORDER — SODIUM CHLORIDE 0.9 % IV SOLN
10.0000 g | INTRAVENOUS | Status: DC | PRN
  Administered 2012-10-11: 5 g/h via INTRAVENOUS

## 2012-10-11 MED ORDER — SODIUM CHLORIDE 0.9 % IV SOLN
250.0000 mL | INTRAVENOUS | Status: DC
  Administered 2012-10-12: 250 mL via INTRAVENOUS

## 2012-10-11 MED ORDER — PANTOPRAZOLE SODIUM 40 MG PO TBEC
40.0000 mg | DELAYED_RELEASE_TABLET | Freq: Every day | ORAL | Status: DC
  Administered 2012-10-13 – 2012-10-23 (×11): 40 mg via ORAL
  Filled 2012-10-11 (×10): qty 1

## 2012-10-11 MED ORDER — POTASSIUM CHLORIDE 10 MEQ/50ML IV SOLN: 10.0000 meq | INTRAVENOUS | Status: AC

## 2012-10-11 MED ORDER — METOPROLOL TARTRATE 1 MG/ML IV SOLN: 2.5000 mg | INTRAVENOUS | Status: DC | PRN

## 2012-10-11 MED ORDER — HEPARIN SODIUM (PORCINE) 1000 UNIT/ML IJ SOLN
INTRAMUSCULAR | Status: DC | PRN
  Administered 2012-10-11: 30 mL via INTRAVENOUS

## 2012-10-11 MED ORDER — SODIUM CHLORIDE 0.45 % IV SOLN
INTRAVENOUS | Status: DC
  Administered 2012-10-11: 20 mL/h via INTRAVENOUS

## 2012-10-11 MED ORDER — SODIUM CHLORIDE 0.9 % IJ SOLN
OROMUCOSAL | Status: DC | PRN
  Administered 2012-10-11 (×7): via TOPICAL

## 2012-10-11 MED ORDER — DEXTROSE 5 % IV SOLN
1.5000 g | Freq: Two times a day (BID) | INTRAVENOUS | Status: AC
  Administered 2012-10-11 – 2012-10-13 (×4): 1.5 g via INTRAVENOUS
  Filled 2012-10-11 (×4): qty 1.5

## 2012-10-11 MED ORDER — ALBUMIN HUMAN 5 % IV SOLN
250.0000 mL | INTRAVENOUS | Status: AC | PRN
  Administered 2012-10-11 (×3): 250 mL via INTRAVENOUS

## 2012-10-11 MED ORDER — BISACODYL 10 MG RE SUPP: 10.0000 mg | Freq: Every day | RECTAL | Status: DC

## 2012-10-11 MED ORDER — DOPAMINE-DEXTROSE 3.2-5 MG/ML-% IV SOLN
INTRAVENOUS | Status: DC | PRN
  Administered 2012-10-11: 3 ug/kg/min via INTRAVENOUS

## 2012-10-11 MED ORDER — MUPIROCIN 2 % EX OINT
TOPICAL_OINTMENT | Freq: Two times a day (BID) | CUTANEOUS | Status: DC
  Administered 2012-10-11 – 2012-10-12 (×3): via NASAL
  Administered 2012-10-13: 1 via NASAL
  Administered 2012-10-13 – 2012-10-15 (×5): via NASAL
  Administered 2012-10-15: 1 via NASAL
  Administered 2012-10-16: 10:00:00 via NASAL
  Administered 2012-10-16 – 2012-10-17 (×2): 1 via NASAL
  Administered 2012-10-17 – 2012-10-20 (×6): via NASAL

## 2012-10-11 MED ORDER — BISACODYL 5 MG PO TBEC
10.0000 mg | DELAYED_RELEASE_TABLET | Freq: Every day | ORAL | Status: DC
  Administered 2012-10-12 – 2012-10-19 (×7): 10 mg via ORAL
  Filled 2012-10-11 (×3): qty 2
  Filled 2012-10-11: qty 1
  Filled 2012-10-11 (×2): qty 2

## 2012-10-11 MED ORDER — DOCUSATE SODIUM 100 MG PO CAPS
200.0000 mg | ORAL_CAPSULE | Freq: Every day | ORAL | Status: DC
  Administered 2012-10-12 – 2012-10-19 (×8): 200 mg via ORAL
  Filled 2012-10-11 (×9): qty 2
  Filled 2012-10-11: qty 1
  Filled 2012-10-11: qty 2

## 2012-10-11 MED ORDER — MIDAZOLAM HCL 2 MG/2ML IJ SOLN: 2.0000 mg | INTRAMUSCULAR | Status: DC | PRN

## 2012-10-11 MED ORDER — SODIUM BICARBONATE 8.4 % IV SOLN
50.0000 meq | Freq: Once | INTRAVENOUS | Status: AC
  Administered 2012-10-11: 50 meq via INTRAVENOUS

## 2012-10-11 MED ORDER — ONDANSETRON HCL 4 MG/2ML IJ SOLN
4.0000 mg | Freq: Four times a day (QID) | INTRAMUSCULAR | Status: DC | PRN
  Administered 2012-10-13 – 2012-10-15 (×2): 4 mg via INTRAVENOUS
  Filled 2012-10-11 (×2): qty 2

## 2012-10-11 MED ORDER — SODIUM CHLORIDE 0.9 % IR SOLN
Status: DC | PRN
  Administered 2012-10-11: 6000 mL

## 2012-10-11 MED ORDER — SODIUM CHLORIDE 0.9 % IV SOLN
INTRAVENOUS | Status: DC | PRN
  Administered 2012-10-11: 13:00:00 via INTRAVENOUS

## 2012-10-11 MED ORDER — ACETAMINOPHEN 160 MG/5ML PO SOLN
975.0000 mg | Freq: Four times a day (QID) | ORAL | Status: DC
  Administered 2012-10-12: 975 mg
  Filled 2012-10-11: qty 40.6

## 2012-10-11 MED ORDER — LACTATED RINGERS IV SOLN
INTRAVENOUS | Status: DC | PRN
  Administered 2012-10-11: 07:00:00 via INTRAVENOUS

## 2012-10-11 MED ORDER — DEXTROSE 5 % IV SOLN
1.5000 g | Freq: Two times a day (BID) | INTRAVENOUS | Status: DC
  Filled 2012-10-11 (×2): qty 1.5

## 2012-10-11 MED ORDER — ROCURONIUM BROMIDE 100 MG/10ML IV SOLN
INTRAVENOUS | Status: DC | PRN
  Administered 2012-10-11 (×3): 50 mg via INTRAVENOUS

## 2012-10-11 MED ORDER — LIDOCAINE HCL (CARDIAC) 20 MG/ML IV SOLN
INTRAVENOUS | Status: DC | PRN
  Administered 2012-10-11: 80 mg via INTRAVENOUS

## 2012-10-11 MED ORDER — SODIUM CHLORIDE 0.9 % IV SOLN
INTRAVENOUS | Status: DC
  Filled 2012-10-11: qty 1

## 2012-10-11 MED ORDER — DEXMEDETOMIDINE HCL IN NACL 200 MCG/50ML IV SOLN
0.1000 ug/kg/h | INTRAVENOUS | Status: DC
  Administered 2012-10-11: 0.7 ug/kg/h via INTRAVENOUS
  Filled 2012-10-11: qty 50

## 2012-10-11 MED ORDER — SODIUM CHLORIDE 0.9 % IV SOLN
INTRAVENOUS | Status: DC
  Administered 2012-10-11: 20 mL/h via INTRAVENOUS

## 2012-10-11 MED ORDER — DEXMEDETOMIDINE HCL 200 MCG/2ML IV SOLN
200.0000 ug | INTRAVENOUS | Status: DC | PRN
  Administered 2012-10-11: .3 ug/kg/h via INTRAVENOUS

## 2012-10-11 MED ORDER — ACETAMINOPHEN 10 MG/ML IV SOLN
1000.0000 mg | Freq: Once | INTRAVENOUS | Status: AC
  Administered 2012-10-11: 1000 mg via INTRAVENOUS
  Filled 2012-10-11: qty 100

## 2012-10-11 MED ORDER — MIDAZOLAM HCL 5 MG/5ML IJ SOLN
INTRAMUSCULAR | Status: DC | PRN
  Administered 2012-10-11 (×2): 2 mg via INTRAVENOUS
  Administered 2012-10-11: 3 mg via INTRAVENOUS
  Administered 2012-10-11: 2 mg via INTRAVENOUS
  Administered 2012-10-11: 3 mg via INTRAVENOUS

## 2012-10-11 MED ORDER — PROPOFOL 10 MG/ML IV BOLUS
INTRAVENOUS | Status: DC | PRN
  Administered 2012-10-11: 50 mg via INTRAVENOUS

## 2012-10-11 MED ORDER — FENTANYL CITRATE 0.05 MG/ML IJ SOLN
INTRAMUSCULAR | Status: DC | PRN
  Administered 2012-10-11 (×2): 100 ug via INTRAVENOUS
  Administered 2012-10-11: 150 ug via INTRAVENOUS
  Administered 2012-10-11 (×2): 250 ug via INTRAVENOUS
  Administered 2012-10-11: 100 ug via INTRAVENOUS
  Administered 2012-10-11: 150 ug via INTRAVENOUS
  Administered 2012-10-11: 400 ug via INTRAVENOUS

## 2012-10-11 MED ORDER — ALBUMIN HUMAN 5 % IV SOLN
INTRAVENOUS | Status: DC | PRN
  Administered 2012-10-11: 13:00:00 via INTRAVENOUS

## 2012-10-11 MED ORDER — SODIUM CHLORIDE 0.9 % IV SOLN
100.0000 [IU] | INTRAVENOUS | Status: DC | PRN
  Administered 2012-10-11: .9 [IU]/h via INTRAVENOUS

## 2012-10-11 MED ORDER — LACTATED RINGERS IV SOLN
INTRAVENOUS | Status: DC
  Administered 2012-10-11: 21:00:00 via INTRAVENOUS
  Administered 2012-10-11: 20 mL/h via INTRAVENOUS

## 2012-10-11 MED ORDER — MILRINONE IN DEXTROSE 20 MG/100ML IV SOLN
0.2500 ug/kg/min | INTRAVENOUS | Status: DC
  Filled 2012-10-11: qty 100

## 2012-10-11 MED ORDER — MILRINONE IN DEXTROSE 20 MG/100ML IV SOLN
INTRAVENOUS | Status: DC | PRN
  Administered 2012-10-11: .3 ug/kg/min via INTRAVENOUS

## 2012-10-11 MED ORDER — DOPAMINE-DEXTROSE 3.2-5 MG/ML-% IV SOLN
0.0000 ug/kg/min | INTRAVENOUS | Status: DC
  Filled 2012-10-11: qty 250

## 2012-10-11 MED ORDER — ASPIRIN EC 325 MG PO TBEC
325.0000 mg | DELAYED_RELEASE_TABLET | Freq: Every day | ORAL | Status: DC
  Filled 2012-10-11: qty 1

## 2012-10-11 MED ORDER — MORPHINE SULFATE 2 MG/ML IJ SOLN
2.0000 mg | INTRAMUSCULAR | Status: DC | PRN
  Administered 2012-10-12 (×2): 2 mg via INTRAVENOUS
  Filled 2012-10-11 (×2): qty 1

## 2012-10-11 MED ORDER — VANCOMYCIN HCL IN DEXTROSE 1-5 GM/200ML-% IV SOLN
1000.0000 mg | Freq: Once | INTRAVENOUS | Status: AC
  Administered 2012-10-11: 1000 mg via INTRAVENOUS
  Filled 2012-10-11: qty 200

## 2012-10-11 MED ORDER — MILRINONE IN DEXTROSE 20 MG/100ML IV SOLN
0.0000 ug/kg/min | INTRAVENOUS | Status: DC
  Administered 2012-10-11: 0.3 ug/kg/min via INTRAVENOUS
  Filled 2012-10-11: qty 100

## 2012-10-11 MED ORDER — SODIUM CHLORIDE 0.9 % IR SOLN
Status: DC | PRN
  Administered 2012-10-11: 3000 mL

## 2012-10-11 MED ORDER — NITROGLYCERIN IN D5W 200-5 MCG/ML-% IV SOLN: 0.0000 ug/min | INTRAVENOUS | Status: DC

## 2012-10-11 MED ORDER — PHENYLEPHRINE HCL 10 MG/ML IJ SOLN
10.0000 mg | INTRAVENOUS | Status: DC | PRN
  Administered 2012-10-11: 15 ug/min via INTRAVENOUS

## 2012-10-11 MED ORDER — PROTAMINE SULFATE 10 MG/ML IV SOLN
INTRAVENOUS | Status: DC | PRN
  Administered 2012-10-11: 210 mg via INTRAVENOUS

## 2012-10-11 MED ORDER — DEXTROSE 5 % IV SOLN
1.5000 g | Freq: Three times a day (TID) | INTRAVENOUS | Status: DC
  Filled 2012-10-11 (×2): qty 1.5

## 2012-10-11 MED ORDER — MAGNESIUM SULFATE 40 MG/ML IJ SOLN
4.0000 g | Freq: Once | INTRAMUSCULAR | Status: AC
  Administered 2012-10-11: 4 g via INTRAVENOUS
  Filled 2012-10-11: qty 100

## 2012-10-11 MED ORDER — VECURONIUM BROMIDE 10 MG IV SOLR
INTRAVENOUS | Status: DC | PRN
  Administered 2012-10-11: 10 mg via INTRAVENOUS

## 2012-10-11 MED ORDER — GLUTARALDEHYDE 0.625% SOAKING SOLUTION
TOPICAL | Status: DC | PRN
Start: 2012-10-11 — End: 2012-10-11
  Administered 2012-10-11: 1 via TOPICAL
  Filled 2012-10-11: qty 50

## 2012-10-11 MED ORDER — DEXTROSE 50 % IV SOLN
INTRAVENOUS | Status: AC
  Filled 2012-10-11: qty 50

## 2012-10-11 MED ORDER — MUPIROCIN 2 % EX OINT
TOPICAL_OINTMENT | CUTANEOUS | Status: AC
  Administered 2012-10-11: 1
  Filled 2012-10-11: qty 22

## 2012-10-11 MED ORDER — METOPROLOL TARTRATE 12.5 MG HALF TABLET
12.5000 mg | ORAL_TABLET | Freq: Two times a day (BID) | ORAL | Status: DC
  Filled 2012-10-11 (×3): qty 1

## 2012-10-11 MED ORDER — ASPIRIN 81 MG PO CHEW: 324.0000 mg | CHEWABLE_TABLET | Freq: Every day | ORAL | Status: DC

## 2012-10-11 MED ORDER — INSULIN REGULAR BOLUS VIA INFUSION
0.0000 [IU] | Freq: Three times a day (TID) | INTRAVENOUS | Status: DC
  Filled 2012-10-11: qty 10

## 2012-10-11 MED ORDER — FAMOTIDINE IN NACL 20-0.9 MG/50ML-% IV SOLN
20.0000 mg | Freq: Two times a day (BID) | INTRAVENOUS | Status: AC
  Administered 2012-10-11 (×2): 20 mg via INTRAVENOUS
  Filled 2012-10-11: qty 50

## 2012-10-11 MED ORDER — SODIUM CHLORIDE 0.9 % IJ SOLN
3.0000 mL | Freq: Two times a day (BID) | INTRAMUSCULAR | Status: DC
  Administered 2012-10-12 – 2012-10-17 (×9): 3 mL via INTRAVENOUS
  Administered 2012-10-17: 6 mL via INTRAVENOUS
  Administered 2012-10-18 – 2012-10-22 (×2): 3 mL via INTRAVENOUS

## 2012-10-11 MED ORDER — LACTATED RINGERS IV SOLN: 500.0000 mL | Freq: Once | INTRAVENOUS | Status: AC | PRN

## 2012-10-11 MED ORDER — ARTIFICIAL TEARS OP OINT
TOPICAL_OINTMENT | OPHTHALMIC | Status: DC | PRN
  Administered 2012-10-11: 1 via OPHTHALMIC

## 2012-10-11 MED ORDER — OXYCODONE HCL 5 MG PO TABS
5.0000 mg | ORAL_TABLET | ORAL | Status: DC | PRN
  Administered 2012-10-12 (×2): 5 mg via ORAL
  Administered 2012-10-13: 10 mg via ORAL
  Filled 2012-10-11: qty 2
  Filled 2012-10-11 (×3): qty 1

## 2012-10-11 MED ORDER — MORPHINE SULFATE 2 MG/ML IJ SOLN: 1.0000 mg | INTRAMUSCULAR | Status: AC | PRN

## 2012-10-11 MED ORDER — METOPROLOL TARTRATE 25 MG/10 ML ORAL SUSPENSION
12.5000 mg | Freq: Two times a day (BID) | ORAL | Status: DC
  Filled 2012-10-11 (×3): qty 5

## 2012-10-11 MED ORDER — ACETAMINOPHEN 500 MG PO TABS
1000.0000 mg | ORAL_TABLET | Freq: Four times a day (QID) | ORAL | Status: AC
  Administered 2012-10-12 – 2012-10-16 (×15): 1000 mg via ORAL
  Filled 2012-10-11 (×18): qty 2

## 2012-10-11 MED ORDER — PHENYLEPHRINE HCL 10 MG/ML IJ SOLN
0.0000 ug/min | INTRAVENOUS | Status: DC
  Filled 2012-10-11 (×2): qty 2

## 2012-10-11 MED ORDER — SODIUM CHLORIDE 0.9 % IJ SOLN
3.0000 mL | INTRAMUSCULAR | Status: DC | PRN
  Administered 2012-10-20: 3 mL via INTRAVENOUS

## 2012-10-11 SURGICAL SUPPLY — 103 items
ADAPTER CARDIO PERF ANTE/RETRO (ADAPTER) ×6 IMPLANT
ADH SKN CLS APL DERMABOND .7 (GAUZE/BANDAGES/DRESSINGS) ×2
ADPR PRFSN 84XANTGRD RTRGD (ADAPTER) ×4
APPLICATOR COTTON TIP 6IN STRL (MISCELLANEOUS) IMPLANT
ATRICLIP EXCLUSION 40 STD HAND (Clip) ×1 IMPLANT
ATTRACTOMAT 16X20 MAGNETIC DRP (DRAPES) ×5 IMPLANT
BAG DECANTER FOR FLEXI CONT (MISCELLANEOUS) ×6 IMPLANT
BLADE STERNUM SYSTEM 6 (BLADE) ×5 IMPLANT
BLADE SURG 11 STRL SS (BLADE) ×5 IMPLANT
CANISTER SUCTION 2500CC (MISCELLANEOUS) ×6 IMPLANT
CANN PRFSN 3/8X14X24FR PCFC (MISCELLANEOUS)
CANN PRFSN 3/8XCNCT ST RT ANG (MISCELLANEOUS)
CANNULA GUNDRY RCSP 15FR (MISCELLANEOUS) ×3 IMPLANT
CANNULA PRFSN 3/8X14X24FR PCFC (MISCELLANEOUS) IMPLANT
CANNULA PRFSN 3/8XCNCT RT ANG (MISCELLANEOUS) IMPLANT
CANNULA VEN MTL TIP RT (MISCELLANEOUS)
CANNULA VENNOUS METAL TIP 20FR (CANNULA) ×1 IMPLANT
CARDIOBLATE CARDIAC ABLATION (MISCELLANEOUS)
CATH ROBINSON RED A/P 18FR (CATHETERS) ×1 IMPLANT
CATH THORACIC 28FR RT ANG (CATHETERS) IMPLANT
CATH THORACIC 36FR (CATHETERS) ×2 IMPLANT
CLAMP ISOLATOR SYNERGY LG (MISCELLANEOUS) ×3 IMPLANT
CLIP FOGARTY SPRING 6M (CLIP) IMPLANT
CLOTH BEACON ORANGE TIMEOUT ST (SAFETY) ×5 IMPLANT
CONN 1/2X1/2X1/2  BEN (MISCELLANEOUS) ×1
CONN 1/2X1/2X1/2 BEN (MISCELLANEOUS) ×4 IMPLANT
CONN 3/8X1/2 ST GISH (MISCELLANEOUS) ×10 IMPLANT
CONN ST 1/4X3/8  BEN (MISCELLANEOUS) ×2
CONN ST 1/4X3/8 BEN (MISCELLANEOUS) IMPLANT
CONT SPEC 4OZ CLIKSEAL STRL BL (MISCELLANEOUS) ×1 IMPLANT
COVER SURGICAL LIGHT HANDLE (MISCELLANEOUS) ×7 IMPLANT
CRADLE DONUT ADULT HEAD (MISCELLANEOUS) ×6 IMPLANT
DERMABOND ADVANCED (GAUZE/BANDAGES/DRESSINGS) ×1
DERMABOND ADVANCED .7 DNX12 (GAUZE/BANDAGES/DRESSINGS) IMPLANT
DEVICE CARDIOBLATE CARDIAC ABL (MISCELLANEOUS) IMPLANT
DRAIN CHANNEL 32F RND 10.7 FF (WOUND CARE) ×6 IMPLANT
DRAPE CARDIOVASCULAR INCISE (DRAPES) ×3
DRAPE INCISE IOBAN 66X45 STRL (DRAPES) ×1 IMPLANT
DRAPE SLUSH/WARMER DISC (DRAPES) ×3 IMPLANT
DRAPE SRG 135X102X78XABS (DRAPES) ×2 IMPLANT
DRSG COVADERM 4X14 (GAUZE/BANDAGES/DRESSINGS) ×5 IMPLANT
ELECT REM PT RETURN 9FT ADLT (ELECTROSURGICAL) ×6
ELECTRODE REM PT RTRN 9FT ADLT (ELECTROSURGICAL) ×8 IMPLANT
GLOVE BIO SURGEON STRL SZ 6 (GLOVE) ×2 IMPLANT
GLOVE BIO SURGEON STRL SZ 6.5 (GLOVE) ×10 IMPLANT
GLOVE BIO SURGEON STRL SZ7 (GLOVE) IMPLANT
GLOVE BIO SURGEON STRL SZ7.5 (GLOVE) IMPLANT
GLOVE BIOGEL PI IND STRL 6.5 (GLOVE) IMPLANT
GLOVE BIOGEL PI INDICATOR 6.5 (GLOVE) ×2
GLOVE ORTHO TXT STRL SZ7.5 (GLOVE) ×6 IMPLANT
GOWN STRL NON-REIN LRG LVL3 (GOWN DISPOSABLE) ×24 IMPLANT
HEMOSTAT POWDER SURGIFOAM 1G (HEMOSTASIS) ×13 IMPLANT
INSERT FOGARTY XLG (MISCELLANEOUS) ×4 IMPLANT
KIT BASIN OR (CUSTOM PROCEDURE TRAY) ×5 IMPLANT
KIT ROOM TURNOVER OR (KITS) ×5 IMPLANT
KIT SUCTION CATH 14FR (SUCTIONS) ×14 IMPLANT
LOOP VESSEL SUPERMAXI WHITE (MISCELLANEOUS) ×3 IMPLANT
MARKER GRAFT CORONARY BYPASS (MISCELLANEOUS) IMPLANT
NS IRRIG 1000ML POUR BTL (IV SOLUTION) ×25 IMPLANT
PACK OPEN HEART (CUSTOM PROCEDURE TRAY) ×5 IMPLANT
PAD ARMBOARD 7.5X6 YLW CONV (MISCELLANEOUS) ×10 IMPLANT
PROBE CRYO2-ABLATION MALLABLE (MISCELLANEOUS) IMPLANT
RING MITRAL MEMO 3D 26MM SMD26 (Prosthesis & Implant Heart) ×1 IMPLANT
SET IRRIG TUBING LAPAROSCOPIC (IRRIGATION / IRRIGATOR) ×2 IMPLANT
SOLUTION ANTI FOG 6CC (MISCELLANEOUS) ×1 IMPLANT
SPONGE GAUZE 4X4 12PLY (GAUZE/BANDAGES/DRESSINGS) ×9 IMPLANT
SPONGE LAP 18X18 X RAY DECT (DISPOSABLE) ×1 IMPLANT
SUCKER INTRACARDIAC WEIGHTED (SUCKER) ×5 IMPLANT
SUT BONE WAX W31G (SUTURE) ×3 IMPLANT
SUT ETHIBOND (SUTURE) ×4 IMPLANT
SUT ETHIBOND 2 0 SH (SUTURE) ×13 IMPLANT
SUT ETHIBOND 2 0 SH 36X2 (SUTURE) ×8 IMPLANT
SUT ETHIBOND 2 0 V4 (SUTURE) IMPLANT
SUT ETHIBOND 2 0V4 GREEN (SUTURE) IMPLANT
SUT ETHIBOND 2-0 RB-1 WHT (SUTURE) ×4 IMPLANT
SUT ETHIBOND 4 0 TF (SUTURE) IMPLANT
SUT ETHIBOND 5 0 C 1 30 (SUTURE) ×2 IMPLANT
SUT ETHIBOND X763 2 0 SH 1 (SUTURE) ×8 IMPLANT
SUT MNCRL AB 3-0 PS2 18 (SUTURE) ×6 IMPLANT
SUT PDS AB 1 CTX 36 (SUTURE) ×6 IMPLANT
SUT PROLENE 3 0 SH 1 (SUTURE) ×6 IMPLANT
SUT PROLENE 3 0 SH DA (SUTURE) ×2 IMPLANT
SUT PROLENE 4 0 RB 1 (SUTURE) ×12
SUT PROLENE 4 0 SH DA (SUTURE) ×12 IMPLANT
SUT PROLENE 4-0 RB1 .5 CRCL 36 (SUTURE) ×8 IMPLANT
SUT SILK  1 MH (SUTURE) ×3
SUT SILK 1 MH (SUTURE) ×2 IMPLANT
SUT SILK 1 TIES 10X30 (SUTURE) ×1 IMPLANT
SUT STEEL 6MS V (SUTURE) IMPLANT
SUT STEEL STERNAL CCS#1 18IN (SUTURE) ×1 IMPLANT
SUT STEEL SZ 6 DBL 3X14 BALL (SUTURE) ×2 IMPLANT
SUT VIC AB 2-0 CTX 27 (SUTURE) IMPLANT
SYS ATRICLIP LAA EXCLUSION 45 (CLIP) IMPLANT
SYSTEM ANNULOPLASTY MC3 (Orthopedic Implant) ×1 IMPLANT
SYSTEM SAHARA CHEST DRAIN ATS (WOUND CARE) ×5 IMPLANT
TAPE CLOTH SURG 4X10 WHT LF (GAUZE/BANDAGES/DRESSINGS) ×1 IMPLANT
TAPE PAPER 2X10 WHT MICROPORE (GAUZE/BANDAGES/DRESSINGS) ×1 IMPLANT
TOWEL OR 17X24 6PK STRL BLUE (TOWEL DISPOSABLE) ×9 IMPLANT
TOWEL OR 17X26 10 PK STRL BLUE (TOWEL DISPOSABLE) ×9 IMPLANT
TRAY FOLEY IC TEMP SENS 14FR (CATHETERS) ×2 IMPLANT
TUBING INSUFFLATION 10FT LAP (TUBING) ×3 IMPLANT
UNDERPAD 30X30 INCONTINENT (UNDERPADS AND DIAPERS) ×5 IMPLANT
WATER STERILE IRR 1000ML POUR (IV SOLUTION) ×10 IMPLANT

## 2012-10-11 NOTE — Progress Notes (Signed)
Dr. Cornelius Moras  Aware of pt. Potassium 5.2 and arrival ABG. Orders to recollect ABG at 1500. Will follow up. Rachel Santos

## 2012-10-11 NOTE — Preoperative (Signed)
Beta Blockers   Reason not to administer Beta Blockers:Not Applicable 

## 2012-10-11 NOTE — Anesthesia Postprocedure Evaluation (Signed)
  Anesthesia Post-op Note  Patient: Rachel Santos  Procedure(s) Performed: Procedure(s): TRICUSPID VALVE REPAIR (N/A) MAZE (N/A) INTRAOPERATIVE TRANSESOPHAGEAL ECHOCARDIOGRAM (N/A) MITRAL VALVE REPAIR (MVR) (N/A)  Patient Location: PACU and SICU  Anesthesia Type:General  Level of Consciousness: sedated  Airway and Oxygen Therapy: Patient remains intubated per anesthesia plan  Post-op Pain: none  Post-op Assessment: Post-op Vital signs reviewed, Patient's Cardiovascular Status Stable, Respiratory Function Stable, Patent Airway, No signs of Nausea or vomiting and Pain level controlled  Post-op Vital Signs: Reviewed and stable  Complications: No apparent anesthesia complications

## 2012-10-11 NOTE — Op Note (Signed)
CARDIOTHORACIC SURGERY OPERATIVE NOTE  Date of Procedure:  10/11/2012  Preoperative Diagnosis:   Severe Mitral Regurgitation  Moderate Tricuspid Regurgitation  Recurrent Persistent Atrial Fibrillation  Postoperative Diagnosis: Same   Procedure:   Mitral Valve Repair  Sorin Memo 3D ring annuloplasty (size 26mm, catalog #[id number], serial #[id number])   Tricuspid Valve Repair  Edwards mc3 ring annuloplasty (size 26mm, model #4900, serial #[id number])   Maze Procedure   complete biatrial lesion set using bipolar radiofrequency and cryothermy ablation  clipping of left atrial appendage    Surgeon: Salvatore Decent. 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 She denies any known history of rheumatic fever or rheumatic heart disease, and she states that when she was evaluated for possible heart disease in the distant past she was told that there was nothing significant to be concerned about.she has been followed for several years by her primary care physician with known history of hypertension, type 2 diabetes mellitus, and osteoarthritis. She describes a gradual progression of symptoms of exertional shortness of breath and fatigue over the last few years. In recent months she  has developed more significant exertional shortness of breath with occasional episodes of resting shortness of breath.  In early April the patient had a brief episode of transient monocular blindness that lasted less than 30 minutes. She initially went to her ophthalmologist who was concerned about a possible TIA and subsequently referred her to her primary care physician, Dr. Nicholos Johns.  She was noted to be in atrial fibrillation at that time, and she was started on Xarelto and metoprolol and referred for cardiology consultation.  She underwent 2D echocardiogram and was evaluated by Dr.McAlhany on 07/03/2012.  Transthoracic echocardiogram demonstrated the presence of normal left ventricular size and systolic function with severe mitral regurgitation and moderate tricuspid regurgitation.  A carotid duplex scan was performed revealing no significant extracranial carotid artery stenosis.  She was scheduled for elective transesophageal echocardiogram with DC cardioversion on 07/31/2012. Transesophageal echocardiogram confirmed the presence of severe mitral regurgitation. The patient was cardioverted to sinus rhythm but had are equal and back into persistent atrial fibrillation by the time of her next office visit on 08/16/2012. She subsequently underwent left and right heart catheterization earlier this month revealing mild non-obstructive coronary artery disease with severe mitral regurgitation and preserved LV systolic function.  She was subsequently referred for elective surgical consultation.  The patient has been seen in consultation and counseled at length regarding the indications, risks and potential benefits of surgery.  All questions have been answered, and the patient provides full informed consent for the operation as described.      DETAILS OF THE OPERATIVE PROCEDURE  Preparation:  The patient is brought to the operating room on the above mentioned date and central monitoring was established  by  the anesthesia team including placement of Swan-Ganz catheter and radial arterial line. The patient is placed in the supine position on the operating table.  Intravenous antibiotics are administered. General endotracheal anesthesia is induced uneventfully. A Foley catheter is placed.  Baseline transesophageal echocardiogram was performed.  Findings were notable for normal LV systolic function with type I mitral valve dysfunction and moderate-severe mitral regurgitation.  There was type I tricuspid valve dysfunction with moderate tricuspid regurgitation.  The aortic valve was tricuspid and without any significant stenosis nor regurgitation.  However, there was mild leaflet restriction of one of the cusps.  There appeared to be a small mural thrombus near the apex of the LA appendage.  The patient's chest, abdomen, both groins, and both lower extremities are prepared and draped in a sterile manner. A time out procedure is performed.   Surgical Approach:  A median sternotomy incision was performed and the pericardium is opened. The ascending aorta is normal in appearance.    Extracorporeal Cardiopulmonary Bypass and Myocardial Protection:  The right common femoral vein is cannulated using the Seldinger technique and a guidewire advanced into the right atrium using TEE guidance.  The patient is heparinized systemically and the femoral vein cannulated using a 22 Fr long femoral venous cannula.  The ascending aorta is cannulated for cardiopulmonary bypass.  Adequate heparinization is verified.   A retrograde cardioplegia cannula is placed through the right atrium into the coronary sinus.   The entire pre-bypass portion of the operation was notable for stable hemodynamics.  Cardiopulmonary bypass was begun and the surface of the heart is inspected.  A second venous cannula is placed directly into the superior vena cava.   A cardioplegia cannula is placed in the ascending aorta.  A temperature probe was  placed in the interventricular septum.  The patient is cooled to 32C systemic temperature.  The aortic cross clamp is applied and cold blood cardioplegia is delivered initially in an antegrade fashion through the aortic root.   Supplemental cardioplegia is given retrograde through the coronary sinus catheter.  Iced saline slush is applied for topical hypothermia.  The initial cardioplegic arrest is rapid with early diastolic arrest.  Repeat doses of cardioplegia are administered intermittently throughout the entire cross clamp portion of the operation through the aortic root and through the coronary sinus catheter in order to maintain completely flat electrocardiogram and septal myocardial temperature below 15C.  Myocardial protection was felt to be excellent.   Maze Procedure (left atrial lesion set):  The AtriCure Synergy bipolar radiofrequency ablation clamp is used for all radiofrequency ablation lesions for the maze procedure.  The Atricure CryoICE nitrous oxide cryothermy system is utilized for all cryothermy ablation lesions.   The heart is retracted towards the surgeon's side and the left sided pulmonary veins exposed.  An elliptical ablation lesion is created around the base of the left sided pulmonary veins.  A similar elliptical lesion was created around the base of the left atrial appendage.  The left atrial appendage was obliterated using an Atricure left atrial appendage clip (Atriclip, size 40mm).  The heart was replaced into the pericardial sac.  A left atriotomy incision was performed through the interatrial groove and extended partially across the back wall of the left atrium after opening the oblique sinus inferiorly.  The floor of the left atrium and the mitral valve were exposed using a self-retaining retractor.    An ablation lesion was placed around the right sided pulmonary veins using the bipolar clamp with  one limb of the clamp along the endocardial surface and one along  the epicardial surface posteriorly.  A bipolar ablation lesion was placed across the dome of the left atrium from the cephalad apex of the atriotomy incision to reach the cephalad apex of the elliptical lesion around the left sided pulmonary veins.  A similar bipolar lesion was placed across the back wall of the left atrium from the caudad apex of the atriotomy incision to reach the caudad apex of the elliptical lesion around the left sided pulmonary veins, thereby completing a box.  Finally another bipolar lesion was placed across the back wall of the left atrium from the caudad apex of the atriotomy incision towards the posterior mitral valve annulus.  This lesion was completed along the endocardial surface onto the posterior mitral annulus with a 3 minute duration cryothermy lesion, followed by a second cryothermy lesion along the posterior epicardial surface of the left atrium to the coronary sinus.  This completes the entire left side lesion set of the Cox maze procedure.   Mitral Valve Repair:  The mitral valve was inspected and notable for mild posterior annular calcification.  The mitral valve leaflets were fairly normal with minimal leaflet restriction.  There was pure annular dilatation.   Mitral ring annuloplasty was performed using interrupted horizontal mattress 2-0 Ethibond sutures placed circumferentially around the entire mitral annulus.  The valve is sized to accept a 26mm annuloplasty ring based upon the surface area of the anterior leaflet and the distance between the left and right fibrous trigones.  A Sorin Memo 3D annuloplasty ring (size 26mm, catalog [id number], serial [id number]) was implanted uneventfully.  The valve was tested with saline and appeared perfectly competent with no residual mitral regurgitation and a broad surface area of coaptation.  Rewarming was begun.  The atriotomy was closed using a 2-layer closure of running 3-0 Prolene suture after placing a sump drain across the  mitral valve to serve as a left ventricular vent.  One final dose of warm retrograde "hot shot" cardioplegia was administered retrograde through the coronary sinus catheter while all air was evacuated through the aortic root.  The aortic cross clamp was removed after a total cross clamp time of 84 minutes.   Maze Procedure (right atrial lesion set):  The inferior vena cava cannula was pulled down until the tip was just below the junction between the right atrium and the inferior vena cava. An oblique incision is made in the right atrium. Traction sutures are placed to facilitate exposure of the tricuspid valve. The tricuspid valve is inspected carefully. The tricuspid valve leaflets appear normal with normal mobility and no sign of any fibrosis or thickening.  The AtriCure Synergy bipolar radiofrequency ablation clamp is utilized to create a series of linear lesions in the right atrium, each with one limb of the clamp along the endocardial surface and the other along the epicardial surface. The first lesion is placed from the posterior apex of the atriotomy incision and along the lateral wall of the right atrium to reach the lateral aspect of the superior vena cava. A second lesion is placed in the opposite direction from the posterior apex of the atriotomy incision along the lateral wall to reach the lateral aspect of the inferior vena cava. A third lesion is placed from the midportion of the atriotomy incision extending at a right angle to reach the tip of the right atrial appendage.  Finally, the cryotherapy probe is utilized to complete the right atrial  lesion set by placing the probe along the endocardial surface of the right atrium from the anterior apex of the atriotomy incision to reach the tricuspid annulus at the 2:00 position.    Tricuspid Valve Repair:  Ring annuloplasty is performed using interrupted 2-0 Ethibond horizontal mattress sutures placed circumferentially around the tricuspid annulus  with exception of the area immediately below the triangle of Koch. The valve was sized to accept a 26mm annuloplasty ring based upon the overall surface area of the combined anterior and posterior leaflets. An [facility] 3 annuloplasty ring (size 26mm, model #4900, serial [id number]) is implanted uneventfully. After completion of the annuloplasty the valve was tested with saline and appears to be competent. The right atriotomy incision is closed using a 2 layer closure of running 4-0 Prolene suture.   Procedure Completion:  Epicardial pacing wires are fixed to the right ventricular outflow tract and to the right atrial appendage. The patient is rewarmed to 37C temperature. The aortic and left ventricular vents are removed.  The patient is weaned and disconnected from cardiopulmonary bypass.  The patient's rhythm at separation from bypass was AV paced.  The patient was weaned from cardioplegic bypass on low dose dopamine and milrinone infusions. Total cardiopulmonary bypass time for the operation was 145 minutes.  Followup transesophageal echocardiogram performed after separation from bypass revealed a well-seated mitral annuloplasty ring with no mitral regurgitation.  Mean gradient across the valve was estimated 4 mmHg.  There was a well seated tricuspid annuloplasty ring with no tricuspid regurgitation.  Left ventricular function was unchanged from preoperatively.  The aortic and superior vena cava cannula were removed uneventfully. Protamine was administered to reverse the anticoagulation. The femoral venous cannula was removed and manual pressure held on the groin for 30 minutes.  The mediastinum and pleural space were inspected for hemostasis and irrigated with saline solution. The mediastinum and both pleural spaces were drained using 4 chest tubes placed through separate stab incisions inferiorly.  The soft tissues anterior to the aorta were reapproximated loosely. The sternum is closed with double  strength sternal wire. The soft tissues anterior to the sternum were closed in multiple layers and the skin is closed with a running subcuticular skin closure.   The post-bypass portion of the operation was notable for stable rhythm and hemodynamics.  The patient was transfused 2 units packed red blood cells during cardiopulmonary bypass due to anemia present prior to surgery and exacerbated by acute blood loss and hemodilution.   Patient Disposition:  The patient tolerated the procedure well and is transported to the surgical intensive care in stable condition. There are no intraoperative complications. All sponge instrument and needle counts are verified correct at completion of the operation.     Salvatore Decent. Cornelius Moras MD 10/11/2012 2:49 PM

## 2012-10-11 NOTE — Progress Notes (Signed)
Dr. Cornelius Moras made aware of recollect ABG results. Orders for one amp HCO3 - given. No additional orders received at this time. Will continue to monitor, educate, assess closely. Gildardo Pounds

## 2012-10-11 NOTE — Transfer of Care (Signed)
Immediate Anesthesia Transfer of Care Note  Patient: Rachel Santos  Procedure(s) Performed: Procedure(s): TRICUSPID VALVE REPAIR (N/A) MAZE (N/A) INTRAOPERATIVE TRANSESOPHAGEAL ECHOCARDIOGRAM (N/A) MITRAL VALVE REPAIR (MVR) (N/A)  Patient Location: PACU and SICU  Anesthesia Type:General  Level of Consciousness: sedated  Airway & Oxygen Therapy: Patient remains intubated per anesthesia plan  Post-op Assessment: Report given to PACU RN  Post vital signs: Reviewed and stable  Complications: No apparent anesthesia complications

## 2012-10-11 NOTE — OR Nursing (Signed)
SICU first call @ 1257

## 2012-10-11 NOTE — Progress Notes (Signed)
Dr. Cornelius Moras at bedside. Updated on pt. Status. Aware pt. Not awake enough for SAT/SBT. Will continue to monitor closely. Rachel Santos

## 2012-10-11 NOTE — Progress Notes (Signed)
Utilization Review Completed.Rachel Santos T7/17/2014

## 2012-10-11 NOTE — Progress Notes (Signed)
[facility] BRIEF SICU PROGRESS NOTE  Day of Surgery  S/P Procedure(s) (LRB): TRICUSPID VALVE REPAIR (N/A) MAZE (N/A) INTRAOPERATIVE TRANSESOPHAGEAL ECHOCARDIOGRAM (N/A) MITRAL VALVE REPAIR (MVR) (N/A)   Just starting to wake up on vent AAI paced w/ stable hemodynamics Excellent UOP Chest tube output low  Plan: Continue routine early postop  Pixie Burgener H 10/11/2012 6:08 PM

## 2012-10-11 NOTE — Brief Op Note (Addendum)
10/11/2012  12:17 PM  PATIENT:  Rachel Santos  72 y.o. female  PRE-OPERATIVE DIAGNOSIS:  1. Severe Mitral Regurgitation 2. Moderate to severe Tricuspid Regurgitation 3. Atrial Fibrillation (s/p failed DC Cardioversion)  POST-OPERATIVE DIAGNOSIS:    1. Severe Mitral Regurgitation 2. Moderate to severe Tricuspid Regurgitation 3. 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

## 2012-10-11 NOTE — Interval H&P Note (Signed)
History and Physical Interval Note:  10/11/2012 7:00 AM  Rachel Santos  has presented today for surgery, with the diagnosis of MR TR AFIB  The various methods of treatment have been discussed with the patient and family. After consideration of risks, benefits and other options for treatment, the patient has consented to  Procedure(s) with comments: MITRAL VALVE (MV) REPLACEMENT (N/A) - or repair TRICUSPID VALVE REPAIR (N/A) MAZE (N/A) INTRAOPERATIVE TRANSESOPHAGEAL ECHOCARDIOGRAM (N/A) as a surgical intervention .  The patient's history has been reviewed, patient examined, no change in status, stable for surgery.  I have reviewed the patient's chart and labs.  Questions were answered to the patient's satisfaction.     Tyreisha Ungar H

## 2012-10-11 NOTE — Progress Notes (Signed)
Echo Lab  2D Echocardiogram completed.  Ameshia Pewitt L Kinzleigh Kandler, RDCS 10/11/2012 9:00 AM

## 2012-10-11 NOTE — OR Nursing (Signed)
Volunteer called @ 1256 to notify patient off pump

## 2012-10-11 NOTE — Anesthesia Preprocedure Evaluation (Addendum)
Anesthesia Evaluation  Patient identified by MRN, date of birth, ID band Patient awake    Reviewed: Allergy & Precautions, H&P , NPO status , Patient's Chart, lab work & pertinent test results, reviewed documented beta blocker date and time   History of Anesthesia Complications (+) PONV  Airway Mallampati: II TM Distance: >3 FB Neck ROM: Full    Dental  (+) Edentulous Upper and Edentulous Lower   Pulmonary shortness of breath,    Pulmonary exam normal       Cardiovascular hypertension, Pt. on medications and Pt. on home beta blockers + dysrhythmias Atrial Fibrillation Rhythm:Irregular   Echo 07/2012 - Left ventricle: The cavity size was normal. Hypertrophy   was noted. Systolic function was normal. The estimated   ejection fraction was in the range of 55% to 60%. - Mitral valve: Severe central MR - Left atrium: The atrium was severely dilated. - Right atrium: The atrium was moderately to severely   dilated. - Atrial septum: There was a patent foramen ovale. - Tricuspid valve: Moderate-severe regurgitation.    Neuro/Psych CVA, No Residual Symptoms    GI/Hepatic GERD-  Medicated and Controlled,  Endo/Other  diabetes, Well Controlled, Type 2, Insulin Dependent and Oral Hypoglycemic Agents  Renal/GU      Musculoskeletal   Abdominal Normal abdominal exam  (+)   Peds  Hematology   Anesthesia Other Findings   Reproductive/Obstetrics                         Anesthesia Physical Anesthesia Plan  ASA: IV  Anesthesia Plan: General   Post-op Pain Management:    Induction: Intravenous  Airway Management Planned: Oral ETT  Additional Equipment: Arterial line, CVP, PA Cath and 3D TEE  Intra-op Plan:   Post-operative Plan: Post-operative intubation/ventilation  Informed Consent: I have reviewed the patients History and Physical, chart, labs and discussed the procedure including the risks,  benefits and alternatives for the proposed anesthesia with the patient or authorized representative who has indicated his/her understanding and acceptance.   Dental advisory given  Plan Discussed with: CRNA, Anesthesiologist and Surgeon  Anesthesia Plan Comments:         Anesthesia Quick Evaluation

## 2012-10-12 ENCOUNTER — Inpatient Hospital Stay (HOSPITAL_COMMUNITY): Payer: Medicare Other

## 2012-10-12 LAB — GLUCOSE, CAPILLARY
Glucose-Capillary: 78 mg/dL (ref 70–99)
Glucose-Capillary: 136 mg/dL — ABNORMAL HIGH (ref 70–99)
Glucose-Capillary: 153 mg/dL — ABNORMAL HIGH (ref 70–99)
Glucose-Capillary: 178 mg/dL — ABNORMAL HIGH (ref 70–99)
Glucose-Capillary: 115 mg/dL — ABNORMAL HIGH (ref 70–99)
Glucose-Capillary: 105 mg/dL — ABNORMAL HIGH (ref 70–99)
Glucose-Capillary: 92 mg/dL (ref 70–99)
Glucose-Capillary: 96 mg/dL (ref 70–99)
Glucose-Capillary: 100 mg/dL — ABNORMAL HIGH (ref 70–99)
Glucose-Capillary: 90 mg/dL (ref 70–99)
Glucose-Capillary: 123 mg/dL — ABNORMAL HIGH (ref 70–99)
Glucose-Capillary: 176 mg/dL — ABNORMAL HIGH (ref 70–99)
Glucose-Capillary: 217 mg/dL — ABNORMAL HIGH (ref 70–99)
Glucose-Capillary: 92 mg/dL (ref 70–99)
Glucose-Capillary: 101 mg/dL — ABNORMAL HIGH (ref 70–99)

## 2012-10-12 LAB — POCT I-STAT 3, ART BLOOD GAS (G3+)
Bicarbonate: 22.3 mEq/L (ref 20.0–24.0)
Bicarbonate: 24.2 mEq/L — ABNORMAL HIGH (ref 20.0–24.0)
Bicarbonate: 24.8 mEq/L — ABNORMAL HIGH (ref 20.0–24.0)
O2 Saturation: 95 %
O2 Saturation: 94 %
O2 Saturation: 92 %
TCO2: 23 mmol/L (ref 0–100)
TCO2: 25 mmol/L (ref 0–100)
TCO2: 26 mmol/L (ref 0–100)
pCO2 arterial: 37.6 mmHg (ref 35.0–45.0)
pCO2 arterial: 40.6 mmHg (ref 35.0–45.0)
pCO2 arterial: 46.6 mmHg — ABNORMAL HIGH (ref 35.0–45.0)
pO2, Arterial: 68 mmHg — ABNORMAL LOW (ref 80.0–100.0)

## 2012-10-12 LAB — BASIC METABOLIC PANEL
BUN: 16 mg/dL (ref 6–23)
Calcium: 7.4 mg/dL — ABNORMAL LOW (ref 8.4–10.5)
GFR calc non Af Amer: 84 mL/min — ABNORMAL LOW (ref >90)
Glucose, Bld: 107 mg/dL — ABNORMAL HIGH (ref 70–99)
Sodium: 141 mEq/L (ref 135–145)

## 2012-10-12 LAB — CBC
HCT: 30.8 % — ABNORMAL LOW (ref 36.0–46.0)
HCT: 30.4 % — ABNORMAL LOW (ref 36.0–46.0)
Hemoglobin: 10.4 g/dL — ABNORMAL LOW (ref 12.0–15.0)
MCH: 28.1 pg (ref 26.0–34.0)
MCHC: 33.8 g/dL (ref 30.0–36.0)
MCHC: 33.2 g/dL (ref 30.0–36.0)
MCV: 84.7 fL (ref 78.0–100.0)
RDW: 16.4 % — ABNORMAL HIGH (ref 11.5–15.5)

## 2012-10-12 LAB — POCT I-STAT, CHEM 8
Chloride: 107 mEq/L (ref 96–112)
Glucose, Bld: 236 mg/dL — ABNORMAL HIGH (ref 70–99)
HCT: 30 % — ABNORMAL LOW (ref 36.0–46.0)
Hemoglobin: 10.2 g/dL — ABNORMAL LOW (ref 12.0–15.0)
Potassium: 4.9 mEq/L (ref 3.5–5.1)
Sodium: 139 mEq/L (ref 135–145)

## 2012-10-12 LAB — CREATININE, SERUM: GFR calc Af Amer: 73 mL/min — ABNORMAL LOW (ref >90)

## 2012-10-12 MED ORDER — WARFARIN SODIUM 2 MG PO TABS
2.0000 mg | ORAL_TABLET | Freq: Every day | ORAL | Status: DC
  Administered 2012-10-12 – 2012-10-16 (×4): 2 mg via ORAL
  Filled 2012-10-12 (×6): qty 1

## 2012-10-12 MED ORDER — INSULIN DETEMIR 100 UNIT/ML ~~LOC~~ SOLN
10.0000 [IU] | Freq: Two times a day (BID) | SUBCUTANEOUS | Status: DC
  Administered 2012-10-12: 10 [IU] via SUBCUTANEOUS
  Filled 2012-10-12 (×2): qty 0.1

## 2012-10-12 MED ORDER — DEXTROSE 50 % IV SOLN: 23.0000 mL | Freq: Once | INTRAVENOUS | Status: AC

## 2012-10-12 MED ORDER — SODIUM CHLORIDE 0.9 % IV SOLN
INTRAVENOUS | Status: DC
  Administered 2012-10-12: 31.4 [IU]/h via INTRAVENOUS
  Filled 2012-10-12: qty 1

## 2012-10-12 MED ORDER — ASPIRIN EC 81 MG PO TBEC
81.0000 mg | DELAYED_RELEASE_TABLET | Freq: Every day | ORAL | Status: DC
  Administered 2012-10-12 – 2012-10-23 (×12): 81 mg via ORAL
  Filled 2012-10-12 (×12): qty 1

## 2012-10-12 MED ORDER — INSULIN DETEMIR 100 UNIT/ML ~~LOC~~ SOLN
20.0000 [IU] | Freq: Two times a day (BID) | SUBCUTANEOUS | Status: DC
  Filled 2012-10-12 (×2): qty 0.2

## 2012-10-12 MED ORDER — INSULIN ASPART 100 UNIT/ML ~~LOC~~ SOLN
0.0000 [IU] | Freq: Once | SUBCUTANEOUS | Status: AC
  Administered 2012-10-12: 2 [IU] via SUBCUTANEOUS

## 2012-10-12 MED ORDER — MORPHINE SULFATE 2 MG/ML IJ SOLN: 1.0000 mg | INTRAMUSCULAR | Status: DC | PRN

## 2012-10-12 MED ORDER — DEXTROSE 50 % IV SOLN
INTRAVENOUS | Status: AC
  Administered 2012-10-12: 23 mL via INTRAVENOUS
  Filled 2012-10-12: qty 50

## 2012-10-12 MED ORDER — INSULIN DETEMIR 100 UNIT/ML ~~LOC~~ SOLN: 10.0000 [IU] | Freq: Two times a day (BID) | SUBCUTANEOUS | Status: DC

## 2012-10-12 MED ORDER — DEXTROSE 50 % IV SOLN
18.0000 mL | Freq: Once | INTRAVENOUS | Status: AC
  Administered 2012-10-12: 18 mL via INTRAVENOUS

## 2012-10-12 MED ORDER — POTASSIUM CHLORIDE 10 MEQ/50ML IV SOLN
10.0000 meq | INTRAVENOUS | Status: AC
  Administered 2012-10-12: 10 meq via INTRAVENOUS

## 2012-10-12 MED ORDER — DEXTROSE 50 % IV SOLN
INTRAVENOUS | Status: AC
  Administered 2012-10-12: 12 mL
  Filled 2012-10-12: qty 50

## 2012-10-12 MED ORDER — FUROSEMIDE 10 MG/ML IJ SOLN
20.0000 mg | Freq: Four times a day (QID) | INTRAMUSCULAR | Status: AC
  Administered 2012-10-12 (×3): 20 mg via INTRAVENOUS
  Filled 2012-10-12 (×3): qty 2

## 2012-10-12 MED ORDER — INSULIN ASPART 100 UNIT/ML ~~LOC~~ SOLN
0.0000 [IU] | SUBCUTANEOUS | Status: DC
  Administered 2012-10-12: 4 [IU] via SUBCUTANEOUS

## 2012-10-12 MED ORDER — PAROXETINE HCL 30 MG PO TABS
30.0000 mg | ORAL_TABLET | Freq: Every day | ORAL | Status: DC
  Administered 2012-10-13 – 2012-10-23 (×11): 30 mg via ORAL
  Filled 2012-10-12 (×11): qty 1

## 2012-10-12 MED ORDER — WARFARIN - PHYSICIAN DOSING INPATIENT
Freq: Every day | Status: DC
  Administered 2012-10-12 – 2012-10-16 (×3)

## 2012-10-12 MED ORDER — INSULIN ASPART 100 UNIT/ML ~~LOC~~ SOLN: 0.0000 [IU] | SUBCUTANEOUS | Status: DC

## 2012-10-12 MED ORDER — BOOST / RESOURCE BREEZE PO LIQD: 1.0000 | Freq: Two times a day (BID) | ORAL | Status: DC

## 2012-10-12 MED FILL — Potassium Chloride Inj 2 mEq/ML: INTRAVENOUS | Qty: 40 | Status: AC

## 2012-10-12 MED FILL — Heparin Sodium (Porcine) Inj 1000 Unit/ML: INTRAMUSCULAR | Qty: 10 | Status: AC

## 2012-10-12 MED FILL — Electrolyte-R (PH 7.4) Solution: INTRAVENOUS | Qty: 4000 | Status: AC

## 2012-10-12 MED FILL — Lidocaine HCl IV Inj 20 MG/ML: INTRAVENOUS | Qty: 5 | Status: AC

## 2012-10-12 MED FILL — Sodium Chloride Irrigation Soln 0.9%: Qty: 3000 | Status: AC

## 2012-10-12 MED FILL — Dexmedetomidine HCl IV Soln 200 MCG/2ML: INTRAVENOUS | Qty: 2 | Status: AC

## 2012-10-12 MED FILL — Sodium Bicarbonate IV Soln 8.4%: INTRAVENOUS | Qty: 50 | Status: AC

## 2012-10-12 MED FILL — Mannitol IV Soln 20%: INTRAVENOUS | Qty: 500 | Status: AC

## 2012-10-12 MED FILL — Magnesium Sulfate Inj 50%: INTRAMUSCULAR | Qty: 10 | Status: AC

## 2012-10-12 MED FILL — Sodium Chloride IV Soln 0.9%: INTRAVENOUS | Qty: 1000 | Status: AC

## 2012-10-12 MED FILL — Heparin Sodium (Porcine) Inj 1000 Unit/ML: INTRAMUSCULAR | Qty: 30 | Status: AC

## 2012-10-12 NOTE — Progress Notes (Signed)
      [address]       [place] [address]             [phone]        CARDIOTHORACIC SURGERY PROGRESS NOTE   R1 Day Post-Op Procedure(s) (LRB): TRICUSPID VALVE REPAIR (N/A) MAZE (N/A) INTRAOPERATIVE TRANSESOPHAGEAL ECHOCARDIOGRAM (N/A) MITRAL VALVE REPAIR (MVR) (N/A)  Subjective: Slow to wake up overnight, just extubated at 4:30am.  Feels sore in chest but otherwise okay.  Breathing comfortably.  Objective: Vital signs: BP Readings from Last 1 Encounters:  10/12/12 99/44   Pulse Readings from Last 1 Encounters:  10/12/12 90   Resp Readings from Last 1 Encounters:  10/12/12 22   Temp Readings from Last 1 Encounters:  10/12/12 99.3 F (37.4 C)     Hemodynamics: PAP: (19-48)/(14-22) 48/21 mmHg CO:  [3.6 L/min-6.4 L/min] 5.2 L/min CI:  [1.9 L/min/m2-3.4 L/min/m2] 2.8 L/min/m2  Physical Exam:  Rhythm:   Junctional, AAI paced  Breath sounds: clear  Heart sounds:  RRR w/out murmur  Incisions:  Dressings dry, intact  Abdomen:  Soft, non-distended, non-tender  Extremities:  Warm, well-perfused    Intake/Output from previous day: 07/17 0701 - 07/18 0700 In: 6066.8 [I.V.:3491.8; Blood:965; NG/GT:60; IV Piggyback:1550] Out: 6068 [Urine:3575; Emesis/NG output:300; Blood:1825; Chest Tube:368] Intake/Output this shift:    Lab Results:  Recent Labs  10/11/12 2000 10/11/12 2059 10/12/12 0330  WBC 10.5  --  10.3  HGB 11.3* 10.9* 10.4*  HCT 32.8* 32.0* 30.8*  PLT 94*  --  90*   BMET:  Recent Labs  10/09/12 1657  10/11/12 2059 10/12/12 0330  NA 138  < > 145 141  K 4.8  < > 4.1 3.9  CL 105  --  107 111  CO2 19  --   --  24  GLUCOSE 121*  < > 161* 107*  BUN 24*  --  15 16  CREATININE 0.82  < > 0.70 0.71  CALCIUM 9.5  --   --  7.4*  < > = values in this interval not displayed.  CBG (last 3)   Recent Labs  10/12/12 0524 10/12/12 0601 10/12/12 0637  GLUCAP 96 89 100*   ABG    Component Value Date/Time   PHART 7.336* 10/12/2012  0603   HCO3 24.8* 10/12/2012 0603   TCO2 26 10/12/2012 0603   ACIDBASEDEF 1.0 10/12/2012 0603   O2SAT 92.0 10/12/2012 0603   CXR: Looks good.  Mild pulm vasc congestion and atelectasis  Assessment/Plan: S/P Procedure(s) (LRB): TRICUSPID VALVE REPAIR (N/A) MAZE (N/A) INTRAOPERATIVE TRANSESOPHAGEAL ECHOCARDIOGRAM (N/A) MITRAL VALVE REPAIR (MVR) (N/A)  Doing well POD1 Maintaining sinus/junctional rhythm - AAI pacing Hemodynamics stable off all drips except low dose milrinone Expected post op acute blood loss anemia, mild, stable Expected post op volume excess, mild, diuresing Type II diabetes mellitus, excellent glycemic control on insulin drip   Wean milrinone off  Mobilize  Diuresis  D/C tubes and lines  Start coumadin slowly  Restart amiodarone in 1-2 days as underlying HR increases  Continue insulin drip per protocol and add levemir  Restart Cozaar in 2-3 days as BP and renal function allow    OWEN,CLARENCE H 10/12/2012 7:57 AM

## 2012-10-12 NOTE — Progress Notes (Signed)
Spoke with [employer] PA concerning pt blood glucose, orders changed for long acting insulin.

## 2012-10-12 NOTE — Progress Notes (Signed)
POD # 1  BP 115/49  Pulse 80  Temp(Src) 98.6 F (37 C) (Oral)  Resp 24  Ht 5' 6.5" (1.689 m)  Wt 186 lb 1.1 oz (84.4 kg)  BMI 29.59 kg/m2  SpO2 97% Still atrial paced   Intake/Output Summary (Last 24 hours) at 10/12/12 1813 Last data filed at 10/12/12 1500  Gross per 24 hour  Intake 2526.11 ml  Output   2860 ml  Net -333.89 ml   CBG rising- on levimir/ SSI  Doing well POD # 1

## 2012-10-12 NOTE — Progress Notes (Addendum)
INITIAL NUTRITION ASSESSMENT  DOCUMENTATION CODES Per approved criteria  -Not Applicable   INTERVENTION: Recommend addition of oral nutrition supplement if oral intake is poor as diet advances. RD to continue to follow nutrition care plan.  NUTRITION DIAGNOSIS: Increased nutrient needs related to post-op healing as evidenced by estimated needs.   Goal: Advance diet as tolerated; intake to meet >90% of estimated nutrition needs.  Monitor:  weight trends, lab trends, I/O's, diet advancement, PO intake, supplement tolerance  Reason for Assessment: Low Braden  72 y.o. female  Admitting Dx: S/P mitral valve repair  ASSESSMENT: PMHx significant for HTN, DM2 and osteoarthritis. Admitted with worsening exertional SOB. Work-up reveals severe symptomatic mitral regurgitation.  S/p the following on 7/17: Mitral valve replacements Tricuspid valve repair Maze procedure Intraoperative transesophageal echocardiogram  Extubated 7/18. Pt reports that she has yet to receive any clear liquid trays.  RD ordered Resource [employer]ze supplements however was paged by RN regarding concern of potential blood sugar elevations with this oral nutrition supplement. Discussed need for protein supplementation given limited diet of clear liquid diet; RN notes that pt is very likely to upgrade to Regular diet soon. RD to follow along for diet advancement, oral intake and need for oral nutrition supplement.  Height: Ht Readings from Last 1 Encounters:  10/11/12 5' 6.5" (1.689 m)    Weight: Wt Readings from Last 1 Encounters:  10/12/12 186 lb 1.1 oz (84.4 kg)    Ideal Body Weight: 132.5 lb  % Ideal Body Weight: 140%  Wt Readings from Last 10 Encounters:  10/12/12 186 lb 1.1 oz (84.4 kg)  10/12/12 186 lb 1.1 oz (84.4 kg)  10/09/12 172 lb 9.6 oz (78.291 kg)  10/04/12 172 lb (78.019 kg)  09/24/12 172 lb (78.019 kg)  09/05/12 170 lb (77.111 kg)  09/05/12 170 lb (77.111 kg)  08/16/12 170 lb (77.111 kg)   07/31/12 172 lb (78.019 kg)  07/31/12 172 lb (78.019 kg)    Usual Body Weight: 170 - 172 lb  % Usual Body Weight: 109%  BMI:  Body mass index is 29.59 kg/(m^2). Overweight  Estimated Nutritional Needs: Kcal: 1600 - 1750 Protein: 85 - 95 g Fluid: 1.8 - 2 liters daily  Skin:  Chest incision  Diet Order: Clear Liquid  EDUCATION NEEDS: -No education needs identified at this time   Intake/Output Summary (Last 24 hours) at 10/12/12 1244 Last data filed at 10/12/12 1200  Gross per 24 hour  Intake 5268.01 ml  Output   5648 ml  Net -379.99 ml    Last BM: PTA  Labs:   Recent Labs Lab 10/09/12 1657  10/11/12 1407 10/11/12 2000 10/11/12 2059 10/12/12 0330  NA 138  < > 139  --  145 141  K 4.8  < > 5.2*  --  4.1 3.9  CL 105  --   --   --  107 111  CO2 19  --   --   --   --  24  BUN 24*  --   --   --  15 16  CREATININE 0.82  --   --  0.69 0.70 0.71  CALCIUM 9.5  --   --   --   --  7.4*  MG  --   --   --  2.5  --  2.2  GLUCOSE 121*  < > 190*  --  161* 107*  < > = values in this interval not displayed.  CBG (last 3)   Recent Labs  10/12/12 0524 10/12/12 0601 10/12/12 0637  GLUCAP 96 89 100*   Lab Results  Component Value Date   HGBA1C 6.3* 10/09/2012    Scheduled Meds: . acetaminophen  1,000 mg Oral Q6H  . aspirin EC  81 mg Oral Daily  . bisacodyl  10 mg Oral Daily   Or  . bisacodyl  10 mg Rectal Daily  . cefUROXime (ZINACEF)  IV  1.5 g Intravenous Q12H  . docusate sodium  200 mg Oral Daily  . furosemide  20 mg Intravenous Q6H  . insulin aspart  0-24 Units Subcutaneous Q4H  . insulin detemir  10 Units Subcutaneous BID  . insulin regular  0-10 Units Intravenous TID WC  . mupirocin ointment   Nasal BID  . [START ON 10/13/2012] pantoprazole  40 mg Oral Daily  . [START ON 10/13/2012] PARoxetine  30 mg Oral Daily  . sodium chloride  3 mL Intravenous Q12H  . warfarin  2 mg Oral q1800  . Warfarin - Physician Dosing Inpatient   Does not apply q1800     Continuous Infusions: . sodium chloride 250 mL (10/12/12 0542)  . insulin (NOVOLIN-R) infusion 2.2 Units/hr (10/12/12 0744)  . milrinone Stopped (10/12/12 1100)    Past Medical History  Diagnosis Date  . Diabetes mellitus   . Arthritis   . Hypertension   . Atrial fibrillation   . Mitral regurgitation   . GERD (gastroesophageal reflux disease)   . Cataracts, bilateral   . PONV (postoperative nausea and vomiting)   . Tricuspid regurgitation   . Shortness of breath     with activity  . Stroke     mini stroke with vision problems  . 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 S/P Maze operation for atrial fibrillation 10/11/2012    Complete bilateral atrial lesion set using bipolar radiofrequency and cryothermy ablation with clipping of LA appendage    Past Surgical History  Procedure Laterality Date  . Mastectomy Left 1982  . Cesarean section      x 2  . Tee without cardioversion N/A 07/31/2012    Procedure: TRANSESOPHAGEAL ECHOCARDIOGRAM (TEE);  Surgeon: Wendall Stade, MD;  Location: [facility] ENDOSCOPY;  Service: Cardiovascular;  Laterality: N/A;  . Cardioversion N/A 07/31/2012    Procedure: CARDIOVERSION;  Surgeon: Wendall Stade, MD;  Location: [facility] ENDOSCOPY;  Service: Cardiovascular;  Laterality: N/A;  . Tubal ligation    . Cardiac catheterization  09-05-12    Jarold Motto MS, RD, LDN Pager: [phone] After-hours pager: [phone]

## 2012-10-12 NOTE — Procedures (Signed)
Extubation Procedure Note  Patient Details:   Name: SHARLISA HOLLIFIELD DOB: 06/11/1940 MRN: [id number]   Airway Documentation:   Pt extubated following Rapid Wean protocol. Pt followed commands prior to extubation. Cuff leak present. NIF: 28, VC 700. Pt extubated to 4L [place] Sats: 92%. O2 increased to 6L [place] Sats: 94%. Pt able to verablize name/location. No stridor audible. Clear BBS.    Evaluation  O2 sats: stable throughout Complications: No apparent complications Patient did tolerate procedure well. Bilateral Breath Sounds: Clear   Yes  Elmer Picker 10/12/2012, 4:45 AM

## 2012-10-13 ENCOUNTER — Inpatient Hospital Stay (HOSPITAL_COMMUNITY): Payer: Medicare Other

## 2012-10-13 LAB — GLUCOSE, CAPILLARY
Glucose-Capillary: 71 mg/dL (ref 70–99)
Glucose-Capillary: 79 mg/dL (ref 70–99)
Glucose-Capillary: 107 mg/dL — ABNORMAL HIGH (ref 70–99)
Glucose-Capillary: 114 mg/dL — ABNORMAL HIGH (ref 70–99)
Glucose-Capillary: 138 mg/dL — ABNORMAL HIGH (ref 70–99)
Glucose-Capillary: 197 mg/dL — ABNORMAL HIGH (ref 70–99)
Glucose-Capillary: 200 mg/dL — ABNORMAL HIGH (ref 70–99)
Glucose-Capillary: 176 mg/dL — ABNORMAL HIGH (ref 70–99)

## 2012-10-13 LAB — TYPE AND SCREEN
Unit division: 0
Unit division: 0
Unit division: 0
Unit division: 0

## 2012-10-13 LAB — BASIC METABOLIC PANEL
Chloride: 107 mEq/L (ref 96–112)
GFR calc non Af Amer: 60 mL/min — ABNORMAL LOW (ref >90)

## 2012-10-13 LAB — CBC
Hemoglobin: 10 g/dL — ABNORMAL LOW (ref 12.0–15.0)
MCH: 28.2 pg (ref 26.0–34.0)
Platelets: 82 10*3/uL — ABNORMAL LOW (ref 150–400)
RBC: 3.54 MIL/uL — ABNORMAL LOW (ref 3.87–5.11)
WBC: 11.5 10*3/uL — ABNORMAL HIGH (ref 4.0–10.5)

## 2012-10-13 LAB — PROTIME-INR
INR: 1.4 (ref 0.00–1.49)
Prothrombin Time: 16.8 seconds — ABNORMAL HIGH (ref 11.6–15.2)

## 2012-10-13 MED ORDER — FUROSEMIDE 40 MG PO TABS
40.0000 mg | ORAL_TABLET | Freq: Every day | ORAL | Status: DC
  Administered 2012-10-13: 40 mg via ORAL
  Filled 2012-10-13 (×2): qty 1

## 2012-10-13 MED ORDER — OXYCODONE HCL 5 MG PO TABS
5.0000 mg | ORAL_TABLET | ORAL | Status: DC | PRN
  Administered 2012-10-13 – 2012-10-15 (×3): 5 mg via ORAL
  Filled 2012-10-13: qty 2
  Filled 2012-10-13 (×3): qty 1

## 2012-10-13 MED ORDER — INSULIN ASPART 100 UNIT/ML ~~LOC~~ SOLN
0.0000 [IU] | SUBCUTANEOUS | Status: DC
  Administered 2012-10-13: 2 [IU] via SUBCUTANEOUS

## 2012-10-13 MED ORDER — INSULIN ASPART 100 UNIT/ML ~~LOC~~ SOLN
0.0000 [IU] | Freq: Three times a day (TID) | SUBCUTANEOUS | Status: DC
  Administered 2012-10-13: 5 [IU] via SUBCUTANEOUS
  Administered 2012-10-13: 3 [IU] via SUBCUTANEOUS
  Administered 2012-10-14: 5 [IU] via SUBCUTANEOUS
  Administered 2012-10-14 – 2012-10-15 (×3): 3 [IU] via SUBCUTANEOUS
  Administered 2012-10-15: 5 [IU] via SUBCUTANEOUS
  Administered 2012-10-16: 2 [IU] via SUBCUTANEOUS
  Administered 2012-10-16: 5 [IU] via SUBCUTANEOUS
  Administered 2012-10-17: 2 [IU] via SUBCUTANEOUS
  Administered 2012-10-19: 3 [IU] via SUBCUTANEOUS
  Administered 2012-10-19: 13:00:00 via SUBCUTANEOUS
  Administered 2012-10-19 – 2012-10-20 (×2): 2 [IU] via SUBCUTANEOUS
  Administered 2012-10-20: 3 [IU] via SUBCUTANEOUS
  Administered 2012-10-21: 2 [IU] via SUBCUTANEOUS
  Administered 2012-10-22 – 2012-10-23 (×2): 3 [IU] via SUBCUTANEOUS

## 2012-10-13 NOTE — Progress Notes (Signed)
2 Days Post-Op Procedure(s) (LRB): TRICUSPID VALVE REPAIR (N/A) MAZE (N/A) INTRAOPERATIVE TRANSESOPHAGEAL ECHOCARDIOGRAM (N/A) MITRAL VALVE REPAIR (MVR) (N/A) Subjective: Some mild confusion overnight Mild incisional pain  Objective: Vital signs in last 24 hours: Temp:  [98.2 F (36.8 C)-99.7 F (37.6 C)] 98.5 F (36.9 C) (07/19 0716) Pulse Rate:  [59-81] 79 (07/19 0800) Cardiac Rhythm:  [-] Atrial paced (07/19 0800) Resp:  [19-32] 29 (07/19 0800) BP: (56-148)/(38-68) 148/50 mmHg (07/19 0800) SpO2:  [91 %-98 %] 95 % (07/19 0800) Arterial Line BP: (88-178)/(33-68) 178/65 mmHg (07/19 0800) Weight:  [182 lb 15.7 oz (83 kg)] 182 lb 15.7 oz (83 kg) (07/19 0600)  Hemodynamic parameters for last 24 hours: PAP: (52-58)/(28-32) 58/32 mmHg CO:  [5.1 L/min] 5.1 L/min CI:  [2.7 L/min/m2] 2.7 L/min/m2  Intake/Output from previous day: 07/18 0701 - 07/19 0700 In: 2884.7 [P.O.:2250; I.V.:534.7; IV Piggyback:100] Out: 2005 [Urine:1975; Chest Tube:30] Intake/Output this shift: Total I/O In: 20 [I.V.:20] Out: -   General appearance: alert and cooperative Neurologic: no focal deficits Heart: regular rate and rhythm Lungs: diminished breath sounds bibasilar Abdomen: normal findings: soft, non-tender  Lab Results:  Recent Labs  10/12/12 1700 10/12/12 1716 10/13/12 0400  WBC 11.1*  --  11.5*  HGB 10.1* 10.2* 10.0*  HCT 30.4* 30.0* 30.1*  PLT 84*  --  82*   BMET:  Recent Labs  10/12/12 0330  10/12/12 1716 10/13/12 0400  NA 141  --  139 138  K 3.9  --  4.9 4.4  CL 111  --  107 107  CO2 24  --   --  25  GLUCOSE 107*  --  236* 108*  BUN 16  --  18 23  CREATININE 0.71  < > 0.80 0.92  CALCIUM 7.4*  --   --  7.6*  < > = values in this interval not displayed.  PT/INR:  Recent Labs  10/13/12 0400  LABPROT 16.8*  INR 1.40   ABG    Component Value Date/Time   PHART 7.336* 10/12/2012 0603   HCO3 24.8* 10/12/2012 0603   TCO2 24 10/12/2012 1716   ACIDBASEDEF 1.0 10/12/2012  0603   O2SAT 92.0 10/12/2012 0603   CBG (last 3)   Recent Labs  10/13/12 0346 10/13/12 0536 10/13/12 0713  GLUCAP 107* 114* 138*    Assessment/Plan: S/P Procedure(s) (LRB): TRICUSPID VALVE REPAIR (N/A) MAZE (N/A) INTRAOPERATIVE TRANSESOPHAGEAL ECHOCARDIOGRAM (N/A) MITRAL VALVE REPAIR (MVR) (N/A) POD # 2 CV- still requiring AP, junctional in 30s under pacer  Will follow rhythm, not on beta blocker or amiodarone currently, may ultimately need PPM  Started on low dose coumadin  RESP- CXR shows bibasilar atelectasis L>R  continue IS, pulmonary hygiene  RENAL- lytes, creatinine OK, PO lasix for diuresis  ENDO- hypoglycemic last PM, levimir dc'ed, CBG better this AM, change to AC/HS  Thrombocytopenia- stable, follow  Mobilize  Keep in SICu- mild confusion and pacer dependent     LOS: 2 days    Leonides Minder C 10/13/2012

## 2012-10-13 NOTE — Progress Notes (Signed)
PM ROUNDS  Comfortable  BP 160/69  Pulse 70  Temp(Src) 98.8 F (37.1 C) (Oral)  Resp 19  Ht 5' 6.5" (1.689 m)  Wt 182 lb 15.7 oz (83 kg)  BMI 29.1 kg/m2  SpO2 98%   Intake/Output Summary (Last 24 hours) at 10/13/12 1746 Last data filed at 10/13/12 1300  Gross per 24 hour  Intake 1733.51 ml  Output    885 ml  Net 848.51 ml    Still paced  Continue present care

## 2012-10-13 NOTE — Progress Notes (Signed)
Dr. Dorris Fetch at bedside assessing patient at this time.  No acute distress noted. Patient up OOB sitting in chair eating breakfast.  VSS. Afebrile.

## 2012-10-14 ENCOUNTER — Inpatient Hospital Stay (HOSPITAL_COMMUNITY): Payer: Medicare Other

## 2012-10-14 LAB — CBC
Platelets: 80 10*3/uL — ABNORMAL LOW (ref 150–400)
RBC: 3.43 MIL/uL — ABNORMAL LOW (ref 3.87–5.11)
RDW: 16.2 % — ABNORMAL HIGH (ref 11.5–15.5)
WBC: 9.6 10*3/uL (ref 4.0–10.5)

## 2012-10-14 LAB — BASIC METABOLIC PANEL
CO2: 28 mEq/L (ref 19–32)
Chloride: 102 mEq/L (ref 96–112)
GFR calc Af Amer: >90 mL/min (ref >90)
Potassium: 4.5 mEq/L (ref 3.5–5.1)
Sodium: 135 mEq/L (ref 135–145)

## 2012-10-14 LAB — GLUCOSE, CAPILLARY
Glucose-Capillary: 161 mg/dL — ABNORMAL HIGH (ref 70–99)
Glucose-Capillary: 163 mg/dL — ABNORMAL HIGH (ref 70–99)

## 2012-10-14 LAB — PROTIME-INR
INR: 1.48 (ref 0.00–1.49)
Prothrombin Time: 17.5 seconds — ABNORMAL HIGH (ref 11.6–15.2)

## 2012-10-14 MED ORDER — FUROSEMIDE 10 MG/ML IJ SOLN
40.0000 mg | Freq: Once | INTRAMUSCULAR | Status: AC
  Administered 2012-10-14: 40 mg via INTRAVENOUS
  Filled 2012-10-14: qty 4

## 2012-10-14 MED ORDER — METFORMIN HCL 500 MG PO TABS
1000.0000 mg | ORAL_TABLET | Freq: Two times a day (BID) | ORAL | Status: DC
  Administered 2012-10-14 – 2012-10-23 (×16): 1000 mg via ORAL
  Filled 2012-10-14 (×20): qty 2

## 2012-10-14 MED ORDER — GLIPIZIDE ER 10 MG PO TB24
10.0000 mg | ORAL_TABLET | Freq: Every day | ORAL | Status: DC
  Administered 2012-10-14 – 2012-10-23 (×10): 10 mg via ORAL
  Filled 2012-10-14 (×10): qty 1

## 2012-10-14 NOTE — Progress Notes (Signed)
3 Days Post-Op Procedure(s) (LRB): TRICUSPID VALVE REPAIR (N/A) MAZE (N/A) INTRAOPERATIVE TRANSESOPHAGEAL ECHOCARDIOGRAM (N/A) MITRAL VALVE REPAIR (MVR) (N/A) Subjective: Some confusion overnight No specific c/o this AM  Objective: Vital signs in last 24 hours: Temp:  [98.1 F (36.7 C)-99.1 F (37.3 C)] 98.4 F (36.9 C) (07/20 0736) Pulse Rate:  [55-80] 70 (07/20 0600) Cardiac Rhythm:  [-] A-V Sequential paced (07/20 0759) Resp:  [18-33] 18 (07/20 0600) BP: (111-160)/(41-69) 118/47 mmHg (07/20 0600) SpO2:  [92 %-100 %] 100 % (07/20 0600) Weight:  [181 lb 3.5 oz (82.2 kg)] 181 lb 3.5 oz (82.2 kg) (07/20 0500)  Hemodynamic parameters for last 24 hours:    Intake/Output from previous day: 07/19 0701 - 07/20 0700 In: 580 [P.O.:540; I.V.:40] Out: 1035 [Urine:1035] Intake/Output this shift:    General appearance: alert and no distress Neurologic: intact Heart: regular rate and rhythm Lungs: diminished breath sounds bibasilar Abdomen: normal findings: soft, non-tender  Lab Results:  Recent Labs  10/13/12 0400 10/14/12 0412  WBC 11.5* 9.6  HGB 10.0* 9.6*  HCT 30.1* 29.5*  PLT 82* 80*   BMET:  Recent Labs  10/13/12 0400 10/14/12 0412  NA 138 135  K 4.4 4.5  CL 107 102  CO2 25 28  GLUCOSE 108* 158*  BUN 23 25*  CREATININE 0.92 0.71  CALCIUM 7.6* 8.0*    PT/INR:  Recent Labs  10/14/12 0412  LABPROT 17.5*  INR 1.48   ABG    Component Value Date/Time   PHART 7.336* 10/12/2012 0603   HCO3 24.8* 10/12/2012 0603   TCO2 24 10/12/2012 1716   ACIDBASEDEF 1.0 10/12/2012 0603   O2SAT 92.0 10/12/2012 0603   CBG (last 3)   Recent Labs  10/13/12 1702 10/13/12 2142 10/14/12 0724  GLUCAP 200* 176* 154*    Assessment/Plan: S/P Procedure(s) (LRB): TRICUSPID VALVE REPAIR (N/A) MAZE (N/A) INTRAOPERATIVE TRANSESOPHAGEAL ECHOCARDIOGRAM (N/A) MITRAL VALVE REPAIR (MVR) (N/A) - CV- s/p MVR/TVR/Maze- 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

## 2012-10-14 NOTE — Progress Notes (Signed)
Feels well  BP 116/104  Pulse 71  Temp(Src) 98.9 F (37.2 C) (Oral)  Resp 24  Ht 5' 6.5" (1.689 m)  Wt 181 lb 3.5 oz (82.2 kg)  BMI 28.81 kg/m2  SpO2 100%  Still pacer dependent   Intake/Output Summary (Last 24 hours) at 10/14/12 1823 Last data filed at 10/14/12 1700  Gross per 24 hour  Intake     70 ml  Output   1575 ml  Net  -1505 ml    If still in CHB in AM will ask EPS to see re PPM

## 2012-10-15 ENCOUNTER — Inpatient Hospital Stay (HOSPITAL_COMMUNITY): Payer: Medicare Other

## 2012-10-15 DIAGNOSIS — I4891 Unspecified atrial fibrillation: Secondary | ICD-10-CM

## 2012-10-15 LAB — BASIC METABOLIC PANEL
CO2: 27 mEq/L (ref 19–32)
Chloride: 102 mEq/L (ref 96–112)
Creatinine, Ser: 0.6 mg/dL (ref 0.50–1.10)
GFR calc Af Amer: >90 mL/min (ref >90)
Potassium: 4.2 mEq/L (ref 3.5–5.1)

## 2012-10-15 LAB — CBC
HCT: 30.7 % — ABNORMAL LOW (ref 36.0–46.0)
MCV: 84.8 fL (ref 78.0–100.0)
Platelets: 126 10*3/uL — ABNORMAL LOW (ref 150–400)
RBC: 3.62 MIL/uL — ABNORMAL LOW (ref 3.87–5.11)
RDW: 15.6 % — ABNORMAL HIGH (ref 11.5–15.5)
WBC: 11.1 10*3/uL — ABNORMAL HIGH (ref 4.0–10.5)

## 2012-10-15 LAB — GLUCOSE, CAPILLARY: Glucose-Capillary: 188 mg/dL — ABNORMAL HIGH (ref 70–99)

## 2012-10-15 LAB — PROTIME-INR: INR: 1.42 (ref 0.00–1.49)

## 2012-10-15 MED ORDER — INSULIN DETEMIR 100 UNIT/ML ~~LOC~~ SOLN
10.0000 [IU] | Freq: Two times a day (BID) | SUBCUTANEOUS | Status: DC
  Administered 2012-10-15 – 2012-10-18 (×8): 10 [IU] via SUBCUTANEOUS
  Filled 2012-10-15 (×10): qty 0.1

## 2012-10-15 MED ORDER — HYDRALAZINE HCL 20 MG/ML IJ SOLN
10.0000 mg | INTRAMUSCULAR | Status: DC | PRN
  Administered 2012-10-21: 10 mg via INTRAVENOUS
  Filled 2012-10-15 (×3): qty 1

## 2012-10-15 MED ORDER — LISINOPRIL 20 MG PO TABS
20.0000 mg | ORAL_TABLET | Freq: Once | ORAL | Status: AC
  Administered 2012-10-15: 20 mg via ORAL
  Filled 2012-10-15: qty 1

## 2012-10-15 MED ORDER — LISINOPRIL 20 MG PO TABS
20.0000 mg | ORAL_TABLET | Freq: Every day | ORAL | Status: DC
  Administered 2012-10-16 – 2012-10-17 (×2): 20 mg via ORAL
  Filled 2012-10-15 (×3): qty 1

## 2012-10-15 NOTE — Progress Notes (Signed)
4 Days Post-Op Procedure(s) (LRB): TRICUSPID VALVE REPAIR (N/A) MAZE (N/A) INTRAOPERATIVE TRANSESOPHAGEAL ECHOCARDIOGRAM (N/A) MITRAL VALVE REPAIR (MVR) (N/A) Subjective: Does not feel as well today Very tired on her walk  Objective: Vital signs in last 24 hours: Temp:  [97.9 F (36.6 C)-99.1 F (37.3 C)] 97.9 F (36.6 C) (07/21 0718) Pulse Rate:  [57-85] 85 (07/21 0700) Cardiac Rhythm:  [-] A-V Sequential paced (07/21 0700) Resp:  [16-29] 24 (07/21 0700) BP: (100-161)/(24-104) 142/65 mmHg (07/21 0600) SpO2:  [96 %-100 %] 100 % (07/21 0700) Weight:  [181 lb (82.1 kg)] 181 lb (82.1 kg) (07/21 0600)  Hemodynamic parameters for last 24 hours:    Intake/Output from previous day: 07/20 0701 - 07/21 0700 In: 10 [I.V.:10] Out: 1985 [Urine:1985] Intake/Output this shift:    General appearance: alert and no distress Neurologic: intact Heart: regular rate and rhythm Lungs: diminished breath sounds bibasilar  Lab Results:  Recent Labs  10/14/12 0412 10/15/12 0300  WBC 9.6 11.1*  HGB 9.6* 10.3*  HCT 29.5* 30.7*  PLT 80* 126*   BMET:  Recent Labs  10/14/12 0412 10/15/12 0300  NA 135 138  K 4.5 4.2  CL 102 102  CO2 28 27  GLUCOSE 158* 173*  BUN 25* 23  CREATININE 0.71 0.60  CALCIUM 8.0* 8.7    PT/INR:  Recent Labs  10/15/12 0300  LABPROT 17.0*  INR 1.42   ABG    Component Value Date/Time   PHART 7.336* 10/12/2012 0603   HCO3 24.8* 10/12/2012 0603   TCO2 24 10/12/2012 1716   ACIDBASEDEF 1.0 10/12/2012 0603   O2SAT 92.0 10/12/2012 0603   CBG (last 3)   Recent Labs  10/14/12 1158 10/14/12 1541 10/14/12 2145  GLUCAP 161* 207* 163*    Assessment/Plan: S/P Procedure(s) (LRB): TRICUSPID VALVE REPAIR (N/A) MAZE (N/A) INTRAOPERATIVE TRANSESOPHAGEAL ECHOCARDIOGRAM (N/A) MITRAL VALVE REPAIR (MVR) (N/A) - CV- still pacing- has slow rhythm underneath- junctional or slow a fib rate of 40  D/w Dr. Excell Seltzer- we will have EP see her today  RESP- continue  pulmonary hygiene  RENAL- lytes and creatinine OK  ENDO- CBG mildly elevated- will adjust meds  Continue cardiac rehab   LOS: 4 days    HENDRICKSON,STEVEN C 10/15/2012

## 2012-10-15 NOTE — Progress Notes (Signed)
Dr. Donata Clay made aware pt's SBP increased and sustaining 170-180s. Diastolic BP 50s with MAP 70-80s. Pt is currently not being paced and HR is junctional with rate high 50's to low 60's. MD made aware of pt's BP meds taken at home. Only PRN pt has ordered for BP is IV lopressor but do not want to give due to HR. Orders received for dose of lisinopril 20 mg po now and to start daily. Also ordered PRN hydralazine. Will continue to monitor closely. Kerin Ransom, RN

## 2012-10-15 NOTE — Progress Notes (Signed)
4 Days Post-Op Procedure(s) (LRB): TRICUSPID VALVE REPAIR (N/A) MAZE (N/A) INTRAOPERATIVE TRANSESOPHAGEAL ECHOCARDIOGRAM (N/A) MITRAL VALVE REPAIR (MVR) (N/A) Subjective: Slow HR- EP to eval for PPM  Objective: Vital signs in last 24 hours: Temp:  [97.9 F (36.6 C)-99.1 F (37.3 C)] 97.9 F (36.6 C) (07/21 1524) Pulse Rate:  [57-87] 76 (07/21 1500) Cardiac Rhythm:  [-] Normal sinus rhythm (07/21 1500) Resp:  [20-31] 28 (07/21 1500) BP: (102-161)/(24-123) 102/77 mmHg (07/21 1500) SpO2:  [92 %-100 %] 100 % (07/21 1500) Weight:  [181 lb (82.1 kg)] 181 lb (82.1 kg) (07/21 0600)  Hemodynamic parameters for last 24 hours:    Intake/Output from previous day: 07/20 0701 - 07/21 0700 In: 10 [I.V.:10] Out: 1985 [Urine:1985] Intake/Output this shift: Total I/O In: 240 [P.O.:240] Out: 285 [Urine:285]    Lab Results:  Recent Labs  10/14/12 0412 10/15/12 0300  WBC 9.6 11.1*  HGB 9.6* 10.3*  HCT 29.5* 30.7*  PLT 80* 126*   BMET:  Recent Labs  10/14/12 0412 10/15/12 0300  NA 135 138  K 4.5 4.2  CL 102 102  CO2 28 27  GLUCOSE 158* 173*  BUN 25* 23  CREATININE 0.71 0.60  CALCIUM 8.0* 8.7    PT/INR:  Recent Labs  10/15/12 0300  LABPROT 17.0*  INR 1.42   ABG    Component Value Date/Time   PHART 7.336* 10/12/2012 0603   HCO3 24.8* 10/12/2012 0603   TCO2 24 10/12/2012 1716   ACIDBASEDEF 1.0 10/12/2012 0603   O2SAT 92.0 10/12/2012 0603   CBG (last 3)   Recent Labs  10/14/12 2145 10/15/12 0715 10/15/12 1145  GLUCAP 163* 179* 220*    Assessment/Plan: S/P Procedure(s) (LRB): TRICUSPID VALVE REPAIR (N/A) MAZE (N/A) INTRAOPERATIVE TRANSESOPHAGEAL ECHOCARDIOGRAM (N/A) MITRAL VALVE REPAIR (MVR) (N/A) Will prob need PPM per EP Cont current care   LOS: 4 days    VAN TRIGT III,PETER 10/15/2012

## 2012-10-15 NOTE — Consult Note (Signed)
ELECTROPHYSIOLOGY CONSULT NOTE    Patient ID: Rachel Santos MRN: [id number], DOB/AGE: [birth year] 72 y.o.  Admit date: 10/11/2012 Date of Consult: 10-15-2012  Primary Physician: Georgianne Fick, MD Primary Cardiologist: Doylene Bode, MD  Reason for Consultation: bradycardia post MVR, TVR and MAZE  HPI:  Rachel Santos is a 72 year old female with history of DM, HTN, OA, atrial fibrillation, and GERD. She was initially seen by Dr Sanjuana Kava as a new patient for evaluation of atrial fibrillation and severe MR on 07/03/12. She was diagnosed with atrial fibrillation on 06/27/12 in primary care and was started on Metoprolol 50 mg po BID for rate control and Xarelto 20 mg po Qdaily. Echo with normal LV size and function severe MR, moderate TR. She was anticoagulated for 3 weeks and then underwent cardioversion on 07-31-2012 with return of sinus rhythm. Her MR at that time was noted to be central and severe. The LV cavity was not dilated. LV systolic function was normal. There was moderate to severe TR as well. She was seen in follow up on 08-16-2012 and had returned to atrial fibrillation.  Plans were made for right and left heart cath followed by surgical consultation.  She was seen by [facility] and underwent MVR, TVR, MAZE, and LA clipping on 10-11-2012.  Post op, she has had atrial fibrillation with persistent bradycardia and rates in the 40's.  She is currently pacing from her epicardial pacing wires.  EP has been asked to evaluate for treatment options.  Today, she is weaker than she has been and is not feeling as well.  She denies chest pain or shortness of breath.  She does have incisional soreness.    Past Medical History  Diagnosis Date  . Diabetes mellitus   . Arthritis   . Hypertension   . Atrial fibrillation   . Mitral regurgitation   . GERD (gastroesophageal reflux disease)   . Cataracts, bilateral   . PONV (postoperative nausea and vomiting)   . Tricuspid regurgitation   .  Shortness of breath     with activity  . Stroke     mini stroke with vision problems  . 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 S/P Maze operation for atrial fibrillation 10/11/2012    Complete bilateral atrial lesion set using bipolar radiofrequency and cryothermy ablation with clipping of LA appendage     Surgical History:  Past Surgical History  Procedure Laterality Date  . Mastectomy Left 1982  . Cesarean section      x 2  . Tee without cardioversion N/A 07/31/2012    Procedure: TRANSESOPHAGEAL ECHOCARDIOGRAM (TEE);  Surgeon: Wendall Stade, MD;  Location: [facility] ENDOSCOPY;  Service: Cardiovascular;  Laterality: N/A;  . Cardioversion N/A 07/31/2012    Procedure: CARDIOVERSION;  Surgeon: Wendall Stade, MD;  Location: [facility] ENDOSCOPY;  Service: Cardiovascular;  Laterality: N/A;  . Tubal ligation    . Cardiac catheterization  09-05-12     Prescriptions prior to admission  Medication Sig Dispense Refill  . acetaminophen (TYLENOL) 500 MG tablet Take 1,000 mg by mouth every 6 (six) hours as needed for pain.      Marland Kitchen amiodarone (PACERONE) 200 MG tablet Take 1 tablet (200 mg total) by mouth 2 (two) times daily. Begin 7 days prior to surgery.  30 tablet  0  . calcium carbonate 200 MG capsule Calcium citrate  /vitamin D 630mg  (calicum)  1 tab daily      . glipiZIDE (GLUCOTROL XL) 10 MG 24 hr tablet Take 10 mg by mouth daily.       . insulin lispro (HUMALOG KWIKPEN) 100 UNIT/ML SOPN Inject 12-15 Units into the skin daily. After dinner.      . Lancets 28G MISC Lancets and test strips      . LEVEMIR FLEXPEN 100 UNIT/ML injection Inject 20-40 Units into the skin 2 (two) times daily. 40 units AM, 20 units pm      . losartan (COZAAR) 50 MG tablet Take 50 mg by mouth daily.       . meloxicam (MOBIC) 7.5 MG tablet Take 7.5 mg by mouth daily.        . metFORMIN (GLUCOPHAGE) 500 MG tablet Take 1,000 mg by mouth 2 (two) times daily with a meal.       . metoprolol (LOPRESSOR) 50 MG tablet Take 25 mg by mouth 2 (two) times daily.       . Multiple Vitamin (MULTIVITAMIN) tablet Take 1 tablet by mouth daily.      Marland Kitchen omeprazole (PRILOSEC) 40 MG capsule Take 40 mg by mouth daily.       Marland Kitchen PARoxetine (PAXIL) 30 MG tablet Take 30 mg by mouth daily.         Inpatient Medications:  . acetaminophen  1,000 mg Oral Q6H  . aspirin EC  81 mg Oral Daily  . bisacodyl  10 mg Oral Daily   Or  . bisacodyl  10 mg Rectal Daily  . docusate sodium  200 mg Oral Daily  . glipiZIDE  10 mg Oral Daily  . insulin aspart  0-15 Units Subcutaneous TID WC  . insulin detemir  10 Units Subcutaneous BID  . metFORMIN  1,000 mg Oral BID WC  . mupirocin ointment   Nasal BID  . pantoprazole  40 mg Oral Daily  . PARoxetine  30 mg Oral Daily  . sodium chloride  3 mL Intravenous Q12H  . warfarin  2 mg Oral q1800  . Warfarin - Physician Dosing Inpatient   Does not apply q1800    Allergies:  Allergies  Allergen Reactions  . Ultram (Tramadol) Nausea And Vomiting    History   Social History  . Marital Status: Widowed    Spouse Name: N/A    Number of Children: 3  . Years of Education: N/A   Occupational History  . Retired-[employer]    Social History Main Topics  . Smoking status: Former Smoker    Types: Cigarettes    Quit date: 09/25/2006  . Smokeless tobacco: Never Used  . Alcohol Use: No  . Drug Use: No  . Sexually Active: No   Other Topics Concern  . Not on file   Social History Narrative  . No narrative on file    BP 145/55  Pulse 77  Temp(Src) 98.4 F (36.9 C) (Oral)  Resp 27  Ht 5' 6.5" (1.689 m)  Wt 178 lb 12.7 oz (81.1 kg)  BMI 28.43 kg/m2  SpO2 100% Well developed and nourished inmild distress sitting up in a chair HENT normal Neck supple   Carotids brisk and full without bruits Clear Regular rate and rhythm, no  murmurs or gallops Abd-soft with active BS without hepatomegaly No Clubbing cyanosis mild edema Skin-warm and dry A & Oriented  Grossly normal sensory and motor function  Labs:   Lab Results  Component Value Date   WBC 11.1* 10/15/2012   HGB 10.3* 10/15/2012   HCT 30.7* 10/15/2012   MCV 84.8 10/15/2012   PLT 126* 10/15/2012    Recent Labs Lab 10/09/12 1657  10/15/12 0300  NA 138  < > 138  K 4.8  < > 4.2  CL 105  < > 102  CO2 19  < > 27  BUN 24*  < > 23  CREATININE 0.82  < > 0.60  CALCIUM 9.5  < > 8.7  PROT 6.5  --   --   BILITOT 0.3  --   --   ALKPHOS 62  --   --   ALT 16  --   --   AST 18  --   --   GLUCOSE 121*  < > 173*  < > = values in this interval not displayed.   Radiology/Studies:  Dg Chest Port 1 View 10/15/2012   *RADIOLOGY REPORT*  Clinical Data: Weakness. Cardiac surgery.  PORTABLE CHEST - 1 VIEW  Comparison: 10/14/2012.  Findings: The right IJ central venous Cordis is stable.  Stable surgical changes from  valve replacement surgery.  The heart is enlarged but stable.  There are persistent small effusions and bibasilar atelectasis, left greater than right.  Mild central vascular congestion without overt pulmonary edema.  No pneumothorax.  IMPRESSION:  Stable postoperative changes.  Persistent small effusions and left greater than right bibasilar atelectasis with slight improved left basilar aeration.   Original Report Authenticated By: Rudie Meyer, M.D.   7/21EKG:junctional rhythm, ventricular rate 50  TELEMETRY: atrial fibrillation with ventricular pacing, ventricular rate 40's underlying pacer Yesterday when I saw her, she was in sinus rhythm with narrow QRS and a short PR interval. Overnight there is ventricular pacing upon evaluation this morning, she was underlying atrial flutter is an atrial electrogram morphologies. She then reverted to sinus rhythm again. Her rates are in the 70-80s.  At this point she seems to be conducting adequately with appropriate  sinus rates. We will continue to follow with you  But pacing does not seem to be clearly indicated.

## 2012-10-15 NOTE — Progress Notes (Signed)
72 year-old woman s/p mitral and tricuspid repair, Cox cryo Maze, and LA clip. Now with marked bradycardia off all AV nodal blocking agents. Underlying rhythm appears to be atrial fib with slow ventricular rate 40 bpm. Will ask EP to see for consideration of PPM. Discussed plan with patient who understands.  Tonny Bollman 10/15/2012 7:45 AM

## 2012-10-16 ENCOUNTER — Inpatient Hospital Stay (HOSPITAL_COMMUNITY): Payer: Medicare Other

## 2012-10-16 ENCOUNTER — Encounter (HOSPITAL_COMMUNITY)
Admission: RE | Disposition: A | Discharge status: Home Health | Payer: Self-pay | Source: Ambulatory Visit | Attending: Thoracic Surgery (Cardiothoracic Vascular Surgery)

## 2012-10-16 LAB — BASIC METABOLIC PANEL
BUN: 26 mg/dL — ABNORMAL HIGH (ref 6–23)
CO2: 29 mEq/L (ref 19–32)
Calcium: 8.8 mg/dL (ref 8.4–10.5)
Chloride: 101 mEq/L (ref 96–112)
Creatinine, Ser: 0.58 mg/dL (ref 0.50–1.10)
GFR calc Af Amer: >90 mL/min (ref >90)
GFR calc non Af Amer: 89 mL/min — ABNORMAL LOW (ref >90)
Glucose, Bld: 159 mg/dL — ABNORMAL HIGH (ref 70–99)
Potassium: 4.2 mEq/L (ref 3.5–5.1)
Sodium: 136 mEq/L (ref 135–145)

## 2012-10-16 LAB — GLUCOSE, CAPILLARY
Glucose-Capillary: 126 mg/dL — ABNORMAL HIGH (ref 70–99)
Glucose-Capillary: 104 mg/dL — ABNORMAL HIGH (ref 70–99)
Glucose-Capillary: 120 mg/dL — ABNORMAL HIGH (ref 70–99)

## 2012-10-16 LAB — CBC
HCT: 29.5 % — ABNORMAL LOW (ref 36.0–46.0)
Hemoglobin: 9.9 g/dL — ABNORMAL LOW (ref 12.0–15.0)
MCH: 28.7 pg (ref 26.0–34.0)
MCHC: 33.6 g/dL (ref 30.0–36.0)
MCV: 85.5 fL (ref 78.0–100.0)
Platelets: 155 10*3/uL (ref 150–400)
RBC: 3.45 MIL/uL — ABNORMAL LOW (ref 3.87–5.11)
RDW: 15.6 % — ABNORMAL HIGH (ref 11.5–15.5)
WBC: 8.5 10*3/uL (ref 4.0–10.5)

## 2012-10-16 LAB — PROTIME-INR
INR: 1.33 (ref 0.00–1.49)
Prothrombin Time: 16.2 seconds — ABNORMAL HIGH (ref 11.6–15.2)

## 2012-10-16 SURGERY — PERMANENT PACEMAKER INSERTION
Anesthesia: LOCAL

## 2012-10-16 MED ORDER — FUROSEMIDE 10 MG/ML IJ SOLN
40.0000 mg | Freq: Once | INTRAMUSCULAR | Status: AC
  Administered 2012-10-16: 40 mg via INTRAVENOUS
  Filled 2012-10-16: qty 4

## 2012-10-16 NOTE — Progress Notes (Signed)
Inpatient Diabetes Program Recommendations  AACE/ADA: New Consensus Statement on Inpatient Glycemic Control (2013)  Target Ranges:  Prepandial:   less than 140 mg/dL      Peak postprandial:   less than 180 mg/dL (1-2 hours)      Critically ill patients:  140 - 180 mg/dL   Reason for Visit: Results for LEOMA, FOLDS (MRN [id number]) as of 10/16/2012 13:15  Ref. Range 10/15/2012 11:45 10/15/2012 16:37 10/15/2012 21:02 10/16/2012 08:08 10/16/2012 12:22  Glucose-Capillary Latest Range: 70-99 mg/dL 409 (H) 811 (H) 914 (H) 126 (H) 206 (H)   Please add Novolog meal coverage 4 units tid with meals to cover CHO intake.  Will follow.

## 2012-10-16 NOTE — Progress Notes (Signed)
Pt has appeared slightly SOB with activity and talking since beginning of shift but pt's breathing has slowly taken on a paradoxical pattern, slightly shallow respirations, and some accessory muscle use. Pt describes her breathing as "panting." RN and RT at bedside. Pt currently on 2L [place], increased to 4L [place] for support. O2 sats 98-100%. When pt asked if this is new for her, pt reports that this is how her breathing felt during the day as well and is not a change. Lungs are clear with diminished bases. Pt completely neuro intact. 5 mg po oxycodone given per pt complaint of soreness in chest which she felt might be contributing to breathing pattern. Will continue to monitor closely and reassess pt's breathing status frequently. Kerin Ransom, RN

## 2012-10-16 NOTE — Progress Notes (Addendum)
Patient ID: Rachel Santos, female   DOB: 03-26-1941, 72 y.o.   MRN: [id number] EVENING ROUNDS NOTE :     [address]       [place] [address]             [phone]                 5 Days Post-Op Procedure(s) (LRB): TRICUSPID VALVE REPAIR (N/A) MAZE (N/A) INTRAOPERATIVE TRANSESOPHAGEAL ECHOCARDIOGRAM (N/A) MITRAL VALVE REPAIR (MVR) (N/A)  Total Length of Stay:  LOS: 5 days  BP 139/47  Pulse 76  Temp(Src) 98.8 F (37.1 C) (Oral)  Resp 23  Ht 5' 6.5" (1.689 m)  Wt 178 lb 12.7 oz (81.1 kg)  BMI 28.43 kg/m2  SpO2 98%  .Intake/Output     07/21 0701 - 07/22 0700 07/22 0701 - 07/23 0700   P.O. 420 120   I.V. (mL/kg)     Total Intake(mL/kg) 420 (5.2) 120 (1.5)   Urine (mL/kg/hr) 1750 (0.9) 575 (0.6)   Total Output 1750 575   Net -1330 -455        Stool Occurrence 1 x 1 x     . sodium chloride 250 mL (10/12/12 0542)     Lab Results  Component Value Date   WBC 8.5 10/16/2012   HGB 9.9* 10/16/2012   HCT 29.5* 10/16/2012   PLT 155 10/16/2012   GLUCOSE 159* 10/16/2012   ALT 16 10/09/2012   AST 18 10/09/2012   NA 136 10/16/2012   K 4.2 10/16/2012   CL 101 10/16/2012   CREATININE 0.58 10/16/2012   BUN 26* 10/16/2012   CO2 29 10/16/2012   INR 1.33 10/16/2012   HGBA1C 6.3* 10/09/2012   Foley in for urinary retention Ep holding off on pacer now Still with or neck line, try to get peripheral and d/c neck line in am   Delight Ovens MD  Beeper [phone] Office [phone] 10/16/2012 5:57 PM

## 2012-10-16 NOTE — Progress Notes (Signed)
Pt now resting quietly and appears more comfortable. Breathing pattern somewhat improved and pt reports breathing improved. Pt less tachypneic but paradoxical breathing still persists. Lungs remain clear with diminished bases and O2 sats remain 98-100%. AM CXR moved to stat. Will continue to monitor closely. Kerin Ransom, RN

## 2012-10-16 NOTE — Progress Notes (Signed)
Dr. Donata Clay made aware of pt's respiratory status overnight (see previous notes). Made aware of results of AM CXR which showed worsening interstitial edema and worsening left pleural effusions. Also made aware pt's foley was removed at end of day shift and pt has not voided since 2000. Bladder scan says 0 mL in pt's bladder and pt denies urge to void. Orders received to place foley catheter and give 40 mg IV lasix. MD also made aware pt's HR and rhythm initially junctional at 60. Since pt has been resting, rate has dropped to 50 with a pause into the 30's. Pt had backup rate VVI at 50 that initially did not capture. Pt currently A-V paced at 60 however pt's rhythm appears to be going between an afib and junctional and therefore not dually pacing every beat. Rate anywhere between 70-90s. Will leave pacer at current settings per Dr. Donata Clay. Will continue to monitor closely. Kerin Ransom, RN

## 2012-10-16 NOTE — Progress Notes (Signed)
14 French foley catheter placed per MD order using sterile technique. Clear, amber urine return noted. Pt tolerated procedure well. Kerin Ransom, RN

## 2012-10-16 NOTE — Progress Notes (Addendum)
[facility] DAILY ICU PROGRESS NOTE                   [address]            [place] [address]          [phone]   5 Days Post-Op Procedure(s) (LRB): TRICUSPID VALVE REPAIR (N/A) MAZE (N/A) INTRAOPERATIVE TRANSESOPHAGEAL ECHOCARDIOGRAM (N/A) MITRAL VALVE REPAIR (MVR) (N/A)  Total Length of Stay:  LOS: 5 days   Subjective: conts to feel better/stronger  Objective: Vital signs in last 24 hours: Temp:  [97.9 F (36.6 C)-98.4 F (36.9 C)] 98.2 F (36.8 C) (07/22 0400) Pulse Rate:  [44-95] 72 (07/22 0700) Cardiac Rhythm:  [-] A-V Sequential paced (07/22 0700) Resp:  [17-31] 27 (07/22 0700) BP: (102-183)/(36-123) 155/51 mmHg (07/22 0700) SpO2:  [91 %-100 %] 99 % (07/22 0700) Weight:  [178 lb 12.7 oz (81.1 kg)] 178 lb 12.7 oz (81.1 kg) (07/22 0600)  Filed Weights   10/14/12 0500 10/15/12 0600 10/16/12 0600  Weight: 181 lb 3.5 oz (82.2 kg) 181 lb (82.1 kg) 178 lb 12.7 oz (81.1 kg)    Weight change: -2 lb 3.3 oz (-1 kg)   Hemodynamic parameters for last 24 hours:    Intake/Output from previous day: 07/21 0701 - 07/22 0700 In: 420 [P.O.:420] Out: 1750 [Urine:1750]  Intake/Output this shift:    Current Meds: Scheduled Meds: . acetaminophen  1,000 mg Oral Q6H  . aspirin EC  81 mg Oral Daily  . bisacodyl  10 mg Oral Daily   Or  . bisacodyl  10 mg Rectal Daily  . docusate sodium  200 mg Oral Daily  . glipiZIDE  10 mg Oral Daily  . insulin aspart  0-15 Units Subcutaneous TID WC  . insulin detemir  10 Units Subcutaneous BID  . lisinopril  20 mg Oral Daily  . metFORMIN  1,000 mg Oral BID WC  . mupirocin ointment   Nasal BID  . pantoprazole  40 mg Oral Daily  . PARoxetine  30 mg Oral Daily  . sodium chloride  3 mL Intravenous Q12H  . warfarin  2 mg Oral q1800  . Warfarin - Physician Dosing Inpatient   Does not apply q1800   Continuous Infusions: . sodium chloride 250 mL (10/12/12 0542)   PRN Meds:.hydrALAZINE, metoprolol, morphine injection,  ondansetron (ZOFRAN) IV, oxyCODONE, sodium chloride  General appearance: alert, cooperative and no distress Heart: regular rate and rhythm Lungs: clear to auscultation bilaterally Abdomen: benign Extremities: minor edema Wound: incis healing well  Lab Results: CBC: Recent Labs  10/15/12 0300 10/16/12 0355  WBC 11.1* 8.5  HGB 10.3* 9.9*  HCT 30.7* 29.5*  PLT 126* 155   BMET:  Recent Labs  10/15/12 0300 10/16/12 0355  NA 138 136  K 4.2 4.2  CL 102 101  CO2 27 29  GLUCOSE 173* 159*  BUN 23 26*  CREATININE 0.60 0.58  CALCIUM 8.7 8.8    PT/INR:  Recent Labs  10/16/12 0355  LABPROT 16.2*  INR 1.33   Radiology: Dg Chest Port 1 View  10/16/2012   *RADIOLOGY REPORT*  Clinical Data: Respiratory distress.  PORTABLE CHEST - 1 VIEW  Comparison: One-view chest 10/15/2012.  Findings: The patient is status post median sternotomy for valve repair.  A right IJ sheath remains.  The heart is enlarged. Diffuse interstitial edema has increased.  The left pleural effusion has increased.  Bibasilar airspace disease is associated, left greater than right.  IMPRESSION:  1.  Increasing diffuse interstitial edema and left pleural effusion concerning for congestive heart failure. 2.  Progressive left lower lobe airspace disease.  This likely represents atelectasis, but infection is not excluded. 3.  Stable position of the right IJ sheath.   Original Report Authenticated By: Marin Roberts, M.D.   Dg Chest Port 1 View  10/15/2012   *RADIOLOGY REPORT*  Clinical Data: Weakness. Cardiac surgery.  PORTABLE CHEST - 1 VIEW  Comparison: 10/14/2012.  Findings: The right IJ central venous Cordis is stable.  Stable surgical changes from  valve replacement surgery.  The heart is enlarged but stable.  There are persistent small effusions and bibasilar atelectasis, left greater than right.  Mild central vascular congestion without overt pulmonary edema.  No pneumothorax.  IMPRESSION:  Stable postoperative  changes.  Persistent small effusions and left greater than right bibasilar atelectasis with slight improved left basilar aeration.   Original Report Authenticated By: Rudie Meyer, M.D.     Assessment/Plan: S/P Procedure(s) (LRB): TRICUSPID VALVE REPAIR (N/A) MAZE (N/A) INTRAOPERATIVE TRANSESOPHAGEAL ECHOCARDIOGRAM (N/A) MITRAL VALVE REPAIR (MVR) (N/A)  1 Doing well 2 EP evaluating need for PPM, junctional rhythm currently when not paced 3 push pulm toilet/rehab 4 on ACRX- keep low dose till decision on PPM 5 labs stable  GOLD,WAYNE E 10/16/2012 7:48 AM  Patient seen and examined. Agree with above. Interestingly she is currently in SR at 78 bpm! - Dr. Graciela Husbands has seen - await his decision on whether she needs a PM

## 2012-10-16 NOTE — Progress Notes (Signed)
Dr. Tyrone Sage made aware pt converted into afib after getting up to the bedside commode to have a BM around 2230. Rate controlled 60-80's. BP stable. Made aware pt was on po amio prior to surgery but is not currently on any antiarrhythmics. No orders received. Will continue to monitor closely. Kerin Ransom, RN

## 2012-10-17 ENCOUNTER — Encounter (HOSPITAL_COMMUNITY): Payer: Self-pay | Admitting: Thoracic Surgery (Cardiothoracic Vascular Surgery)

## 2012-10-17 ENCOUNTER — Inpatient Hospital Stay (HOSPITAL_COMMUNITY): Payer: Medicare Other

## 2012-10-17 DIAGNOSIS — I4891 Unspecified atrial fibrillation: Secondary | ICD-10-CM

## 2012-10-17 DIAGNOSIS — I495 Sick sinus syndrome: Secondary | ICD-10-CM

## 2012-10-17 LAB — GLUCOSE, CAPILLARY
Glucose-Capillary: 97 mg/dL (ref 70–99)
Glucose-Capillary: 148 mg/dL — ABNORMAL HIGH (ref 70–99)
Glucose-Capillary: 118 mg/dL — ABNORMAL HIGH (ref 70–99)
Glucose-Capillary: 116 mg/dL — ABNORMAL HIGH (ref 70–99)

## 2012-10-17 MED ORDER — GLUCERNA SHAKE PO LIQD: 237.0000 mL | Freq: Two times a day (BID) | ORAL | Status: DC | PRN

## 2012-10-17 MED ORDER — WARFARIN SODIUM 3 MG PO TABS
3.0000 mg | ORAL_TABLET | Freq: Every day | ORAL | Status: DC
  Administered 2012-10-17 – 2012-10-22 (×6): 3 mg via ORAL
  Filled 2012-10-17 (×8): qty 1

## 2012-10-17 MED ORDER — HYDROCODONE-ACETAMINOPHEN 5-325 MG PO TABS
1.0000 | ORAL_TABLET | ORAL | Status: DC | PRN
  Administered 2012-10-17 – 2012-10-18 (×4): 2 via ORAL
  Administered 2012-10-19 (×2): 1 via ORAL
  Administered 2012-10-19: 2 via ORAL
  Administered 2012-10-20 – 2012-10-21 (×4): 1 via ORAL
  Administered 2012-10-21: 2 via ORAL
  Administered 2012-10-22 – 2012-10-23 (×4): 1 via ORAL
  Filled 2012-10-17: qty 1
  Filled 2012-10-17: qty 2
  Filled 2012-10-17 (×4): qty 1
  Filled 2012-10-17 (×4): qty 2
  Filled 2012-10-17: qty 1
  Filled 2012-10-17 (×2): qty 2
  Filled 2012-10-17: qty 1
  Filled 2012-10-17: qty 2
  Filled 2012-10-17: qty 1

## 2012-10-17 MED ORDER — ADULT MULTIVITAMIN W/MINERALS CH
1.0000 | ORAL_TABLET | Freq: Every day | ORAL | Status: DC
  Administered 2012-10-17 – 2012-10-23 (×7): 1 via ORAL
  Filled 2012-10-17 (×7): qty 1

## 2012-10-17 NOTE — Progress Notes (Signed)
PM ROUNDS  No complaints  Just back from a walk  Currently in SR with a rate of 75

## 2012-10-17 NOTE — Progress Notes (Addendum)
[facility] DAILY ICU PROGRESS NOTE                   [address]            [place] [address]          [phone]   6 Days Post-Op Procedure(s) (LRB): TRICUSPID VALVE REPAIR (N/A) MAZE (N/A) INTRAOPERATIVE TRANSESOPHAGEAL ECHOCARDIOGRAM (N/A) MITRAL VALVE REPAIR (MVR) (N/A)  Total Length of Stay:  LOS: 6 days   Subjective: Feels some incisional discomfort, Now in afib with CVR  Objective: Vital signs in last 24 hours: Temp:  [97.8 F (36.6 C)-98.8 F (37.1 C)] 98.8 F (37.1 C) (07/23 0731) Pulse Rate:  [71-101] 86 (07/23 0730) Cardiac Rhythm:  [-] Atrial fibrillation (07/23 0630) Resp:  [21-34] 24 (07/23 0730) BP: (124-169)/(40-88) 133/60 mmHg (07/23 0730) SpO2:  [92 %-100 %] 98 % (07/23 0730) Weight:  [174 lb 2.6 oz (79 kg)] 174 lb 2.6 oz (79 kg) (07/23 0600)  Filed Weights   10/15/12 0600 10/16/12 0600 10/17/12 0600  Weight: 181 lb (82.1 kg) 178 lb 12.7 oz (81.1 kg) 174 lb 2.6 oz (79 kg)    Weight change: -4 lb 10.1 oz (-2.1 kg)   Hemodynamic parameters for last 24 hours:    Intake/Output from previous day: 07/22 0701 - 07/23 0700 In: 183 [P.O.:180; I.V.:3] Out: 1590 [Urine:1590]  Intake/Output this shift:    Current Meds: Scheduled Meds: . aspirin EC  81 mg Oral Daily  . bisacodyl  10 mg Oral Daily   Or  . bisacodyl  10 mg Rectal Daily  . docusate sodium  200 mg Oral Daily  . glipiZIDE  10 mg Oral Daily  . insulin aspart  0-15 Units Subcutaneous TID WC  . insulin detemir  10 Units Subcutaneous BID  . lisinopril  20 mg Oral Daily  . metFORMIN  1,000 mg Oral BID WC  . mupirocin ointment   Nasal BID  . pantoprazole  40 mg Oral Daily  . PARoxetine  30 mg Oral Daily  . sodium chloride  3 mL Intravenous Q12H  . warfarin  2 mg Oral q1800  . Warfarin - Physician Dosing Inpatient   Does not apply q1800   Continuous Infusions: . sodium chloride 250 mL (10/12/12 0542)   PRN Meds:.hydrALAZINE, metoprolol, morphine injection, ondansetron  (ZOFRAN) IV, oxyCODONE, sodium chloride  General appearance: alert, cooperative, fatigued and no distress Heart: irregularly irregular rhythm Lungs: sim in bases Abdomen: soft, nontender Extremities: mild edema Wound: dressings CDI  Lab Results: CBC: Recent Labs  10/15/12 0300 10/16/12 0355  WBC 11.1* 8.5  HGB 10.3* 9.9*  HCT 30.7* 29.5*  PLT 126* 155   BMET:  Recent Labs  10/15/12 0300 10/16/12 0355  NA 138 136  K 4.2 4.2  CL 102 101  CO2 27 29  GLUCOSE 173* 159*  BUN 23 26*  CREATININE 0.60 0.58  CALCIUM 8.7 8.8    PT/INR:  Recent Labs  10/17/12 0500  LABPROT 16.4*  INR 1.36   Radiology: Dg Chest 2 View  10/17/2012   *RADIOLOGY REPORT*  Clinical Data: Postop mitral valve replacement surgery.  CHEST - 2 VIEW  Comparison: 10/16/2012.  Findings: The right IJ central venous Cordis is stable.  The lungs demonstrate improved aeration with resolving edema and atelectasis. A small left effusion persists.  IMPRESSION: Improved lung aeration with resolving edema and atelectasis. Small left effusion.   Original Report Authenticated By: Rudie Meyer, M.D.   Dg Chest [facility]  1 View  10/16/2012   *RADIOLOGY REPORT*  Clinical Data: Respiratory distress.  PORTABLE CHEST - 1 VIEW  Comparison: One-view chest 10/15/2012.  Findings: The patient is status post median sternotomy for valve repair.  A right IJ sheath remains.  The heart is enlarged. Diffuse interstitial edema has increased.  The left pleural effusion has increased.  Bibasilar airspace disease is associated, left greater than right.  IMPRESSION:  1.  Increasing diffuse interstitial edema and left pleural effusion concerning for congestive heart failure. 2.  Progressive left lower lobe airspace disease.  This likely represents atelectasis, but infection is not excluded. 3.  Stable position of the right IJ sheath.   Original Report Authenticated By: Marin Roberts, M.D.     Assessment/Plan: S/P Procedure(s)  (LRB): TRICUSPID VALVE REPAIR (N/A) MAZE (N/A) INTRAOPERATIVE TRANSESOPHAGEAL ECHOCARDIOGRAM (N/A) MITRAL VALVE REPAIR (MVR) (N/A)  1 Stable overall, progressing nicely in recovery 2 Now in afib, rate controlled, s/p maze, LAA clip, on low dose coumadin till decision on pacer made. On no negative chronotropes or anti-dysrhythmics currently. EP has seen patient and is following 3 push rehab/pulm toilet  GOLD,WAYNE E 10/17/2012 7:50 AM patient examined and medical record reviewed,agree with above note. Hold amio and lopressor due to issues with bradycardia=- cont coumadin for slow afib Pulmonary staus better w/ diuresis VAN TRIGT III,PETER 10/17/2012

## 2012-10-17 NOTE — Progress Notes (Signed)
NUTRITION FOLLOW UP  Intervention:   Add Glucerna Shake PO daily prn, if meal intake is poor. Add MVI daily. RD to continue to follow nutrition care plan.  Nutrition Dx:   Increased nutrient needs related to post-op healing as evidenced by estimated needs. Ongoing.  Goal:   Advance diet as tolerated; intake to meet >90% of estimated nutrition needs. Improved.  Monitor:   weight trends, lab trends, I/O's, diet advancement, PO intake, supplement tolerance  Assessment:   PMHx significant for HTN, DM2 and osteoarthritis. Admitted with worsening exertional SOB. Work-up reveals severe symptomatic mitral regurgitation.   S/p the following on 7/17:  Mitral valve replacements  Tricuspid valve repair  Maze procedure  Intraoperative transesophageal echocardiogram   Advanced to Carbohydrate Modified Medium diet on 7/18. Pt reports that her appetite is improving daily. She states that breakfast is her best meal of the day, and RD saw pt has consumed approximately 50% of breakfast so far today. She feels overwhelmed at the amount of food that she is getting. Discussed importance of nutrition post-op.   Height: Ht Readings from Last 1 Encounters:  10/11/12 5' 6.5" (1.689 m)    Weight Status:   Wt Readings from Last 1 Encounters:  10/17/12 174 lb 2.6 oz (79 kg)  Wt down 12 lb since admission; noted net I/O balance is -4 L  Re-estimated needs:  Kcal: 1600 - 1750 Protein: 85 - 95 g Fluid: 1.8 - 2 liters  Skin: chest incision  Diet Order: Carb Control   Intake/Output Summary (Last 24 hours) at 10/17/12 0912 Last data filed at 10/17/12 0900  Gross per 24 hour  Intake    303 ml  Output   1515 ml  Net  -1212 ml    Last BM: 7/22   Labs:   Recent Labs Lab 10/11/12 2000  10/12/12 0330 10/12/12 1700  10/14/12 0412 10/15/12 0300 10/16/12 0355  NA  --   < > 141  --   < > 135 138 136  K  --   < > 3.9  --   < > 4.5 4.2 4.2  CL  --   < > 111  --   < > 102 102 101  CO2  --    --  24  --   < > 28 27 29   BUN  --   < > 16  --   < > 25* 23 26*  CREATININE 0.69  < > 0.71 0.89  < > 0.71 0.60 0.58  CALCIUM  --   --  7.4*  --   < > 8.0* 8.7 8.8  MG 2.5  --  2.2 2.0  --   --   --   --   GLUCOSE  --   < > 107*  --   < > 158* 173* 159*  < > = values in this interval not displayed.  CBG (last 3)   Recent Labs  10/16/12 1222 10/16/12 1718 10/16/12 2108  GLUCAP 206* 104* 120*    Scheduled Meds: . aspirin EC  81 mg Oral Daily  . bisacodyl  10 mg Oral Daily   Or  . bisacodyl  10 mg Rectal Daily  . docusate sodium  200 mg Oral Daily  . glipiZIDE  10 mg Oral Daily  . insulin aspart  0-15 Units Subcutaneous TID WC  . insulin detemir  10 Units Subcutaneous BID  . lisinopril  20 mg Oral Daily  . metFORMIN  1,000 mg Oral  BID WC  . mupirocin ointment   Nasal BID  . pantoprazole  40 mg Oral Daily  . PARoxetine  30 mg Oral Daily  . sodium chloride  3 mL Intravenous Q12H  . warfarin  2 mg Oral q1800  . Warfarin - Physician Dosing Inpatient   Does not apply q1800    Continuous Infusions: . sodium chloride 250 mL (10/12/12 0542)    Jarold Motto MS, RD, LDN Pager: [phone] After-hours pager: [phone]

## 2012-10-17 NOTE — Progress Notes (Signed)
     Patient: Rachel Santos Date of Encounter: 10/17/2012, 8:26 AM Admit date: 10/11/2012     Subjective  Ms. Kerkhoff has no complaints this AM. She denies CP, SOB or palpitations.    Objective  Physical Exam: Vitals: BP 148/55  Pulse 92  Temp(Src) 98.8 F (37.1 C) (Oral)  Resp 26  Ht 5' 6.5" (1.689 m)  Wt 174 lb 2.6 oz (79 kg)  BMI 27.69 kg/m2  SpO2 98% General: Well developed 72 year old female in no acute distress. Neck: Supple. JVD not elevated. Lungs: Rales at right base otherwise clear bilaterally to auscultation without wheezes or rhonchi. Breathing is unlabored. Heart: Irregular S1 S2 without murmurs, rubs, or gallops.  Abdomen: Soft, non-distended. Extremities: No clubbing or cyanosis. No edema.  Distal pedal pulses are 2+ and equal bilaterally. Neuro: Alert and oriented X 3. Moves all extremities spontaneously. No focal deficits.  Intake/Output:  Intake/Output Summary (Last 24 hours) at 10/17/12 0826 Last data filed at 10/17/12 0700  Gross per 24 hour  Intake    183 ml  Output   1515 ml  Net  -1332 ml    Inpatient Medications:  . aspirin EC  81 mg Oral Daily  . bisacodyl  10 mg Oral Daily   Or  . bisacodyl  10 mg Rectal Daily  . docusate sodium  200 mg Oral Daily  . glipiZIDE  10 mg Oral Daily  . insulin aspart  0-15 Units Subcutaneous TID WC  . insulin detemir  10 Units Subcutaneous BID  . lisinopril  20 mg Oral Daily  . metFORMIN  1,000 mg Oral BID WC  . mupirocin ointment   Nasal BID  . pantoprazole  40 mg Oral Daily  . PARoxetine  30 mg Oral Daily  . sodium chloride  3 mL Intravenous Q12H  . warfarin  2 mg Oral q1800  . Warfarin - Physician Dosing Inpatient   Does not apply q1800   . sodium chloride 250 mL (10/12/12 0542)    Labs:  Recent Labs  10/15/12 0300 10/16/12 0355  NA 138 136  K 4.2 4.2  CL 102 101  CO2 27 29  GLUCOSE 173* 159*  BUN 23 26*  CREATININE 0.60 0.58  CALCIUM 8.7 8.8    Recent Labs  10/15/12 0300  10/16/12 0355  WBC 11.1* 8.5  HGB 10.3* 9.9*  HCT 30.7* 29.5*  MCV 84.8 85.5  PLT 126* 155    Recent Labs  10/17/12 0500  INR 1.36    Radiology/Studies: Dg Chest 2 View  10/17/2012   *RADIOLOGY REPORT*  Clinical Data: Postop mitral valve replacement surgery.  CHEST - 2 VIEW  Comparison: 10/16/2012.  Findings: The right IJ central venous Cordis is stable.  The lungs demonstrate improved aeration with resolving edema and atelectasis. A small left effusion persists.  IMPRESSION: Improved lung aeration with resolving edema and atelectasis. Small left effusion.   Original Report Authenticated By: Rudie Meyer, M.D.    Telemetry: AF with rates in 80-95 bpm    Assessment and Plan  1. Atrial fibrillation   - rate controlled  - would resume Coumadin 2. Bradycardia  - resolved; no indication for permanent pacing at this time 3. Valvular heart disease s/p MVR, TVR, MAZE, LAA clip  - per TCTS  Dr. Graciela Husbands to see Signed, Rick Duff PA-C

## 2012-10-17 NOTE — Progress Notes (Signed)
Reported off to oncoming shift RN. Safety maintained. No acute distress noted.  

## 2012-10-17 NOTE — Progress Notes (Signed)
IV team paged/called x 2 for Peripheral IV access prior to removing central line.  Patient has poor/limited access.

## 2012-10-18 DIAGNOSIS — I059 Rheumatic mitral valve disease, unspecified: Principal | ICD-10-CM

## 2012-10-18 LAB — PROTIME-INR: Prothrombin Time: 18.3 seconds — ABNORMAL HIGH (ref 11.6–15.2)

## 2012-10-18 LAB — GLUCOSE, CAPILLARY: Glucose-Capillary: 77 mg/dL (ref 70–99)

## 2012-10-18 MED ORDER — FUROSEMIDE 40 MG PO TABS
40.0000 mg | ORAL_TABLET | Freq: Every day | ORAL | Status: DC
  Administered 2012-10-18 – 2012-10-22 (×5): 40 mg via ORAL
  Filled 2012-10-18 (×6): qty 1

## 2012-10-18 MED ORDER — MOVING RIGHT ALONG BOOK
Freq: Once | Status: AC
  Administered 2012-10-18: 10:00:00
  Filled 2012-10-18: qty 1

## 2012-10-18 MED ORDER — LOSARTAN POTASSIUM 50 MG PO TABS
50.0000 mg | ORAL_TABLET | Freq: Every day | ORAL | Status: DC
  Administered 2012-10-18 – 2012-10-20 (×3): 50 mg via ORAL
  Filled 2012-10-18 (×4): qty 1

## 2012-10-18 MED ORDER — AMIODARONE HCL 200 MG PO TABS
200.0000 mg | ORAL_TABLET | Freq: Every day | ORAL | Status: DC
  Administered 2012-10-18: 200 mg via ORAL
  Filled 2012-10-18 (×2): qty 1

## 2012-10-18 MED ORDER — SODIUM CHLORIDE 0.9 % IJ SOLN: 3.0000 mL | INTRAMUSCULAR | Status: DC | PRN

## 2012-10-18 MED ORDER — SODIUM CHLORIDE 0.9 % IV SOLN: 250.0000 mL | INTRAVENOUS | Status: DC | PRN

## 2012-10-18 MED ORDER — SODIUM CHLORIDE 0.9 % IJ SOLN
3.0000 mL | Freq: Two times a day (BID) | INTRAMUSCULAR | Status: DC
  Administered 2012-10-18 – 2012-10-22 (×7): 3 mL via INTRAVENOUS

## 2012-10-18 MED ORDER — POTASSIUM CHLORIDE CRYS ER 20 MEQ PO TBCR
20.0000 meq | EXTENDED_RELEASE_TABLET | Freq: Every day | ORAL | Status: DC
  Administered 2012-10-18 – 2012-10-22 (×5): 20 meq via ORAL
  Filled 2012-10-18 (×6): qty 1

## 2012-10-18 NOTE — Care Management Note (Signed)
    Page 1 of 2   10/23/2012     4:39:38 PM   CARE MANAGEMENT NOTE 10/23/2012  Patient:  Rachel Santos, Rachel Santos   Account Number:  [id number]  Date Initiated:  10/16/2012  Documentation initiated by:  Alvira Philips Assessment:   72 yr-old female s/p tricuspid and mitral valve repair; lives alone, independent PTA     Action/Plan:   Anticipated DC Date:  10/22/2012   Anticipated DC Plan:  SKILLED NURSING FACILITY  In-house referral  Clinical Social Worker      DC Associate Professor  CM consult      [facility] Choice  HOME HEALTH   Choice offered to / List presented to:  C-4 Adult Children        HH arranged  HH-1 RN  HH-2 PT  HH-4 NURSE'S AIDE  HH-3 OT      HH agency  [facility]   Status of service:  Completed, signed off Medicare Important Message given?   (If response is "NO", the following Medicare IM given date fields will be blank) Date Medicare IM given:   Date Additional Medicare IM given:    Discharge Disposition:  HOME W HOME HEALTH SERVICES  Per UR Regulation:  Reviewed for med. necessity/level of care/duration of stay  If discussed at Long Length of Stay Meetings, dates discussed:   10/18/2012    Comments:  PCP:  Dr Georgianne Fick  10/23/12 Farah Lepak,RN,BSN [phone] REFERRAL TO [facility], PER DAUGHTER CHOICE.  START OF CARE 24-48H POST DC DATE.  PT'S SATS WNL  TODAY.  WILL NOT NEED HOME OXYGEN.  DC ADDRESS:  [address]  [place], [place] [address]  PHONE [phone]  DTR DEBBIE KOONTZ  10/22/12 Quirino Kakos,RN,BSN [phone] DAUGTER DEBBIE NOW WISHES TO TAKE PT HOME WITH HER INSTEAD OF DC TO SNF.  SHE LIVES IN [place]; [place] LIST OF HH PROVIDERS GIVEN TO DTR.  NEEDS TUB SEAT, BUT DAUGHTER PLANS TO GET ON HER OWN.  DTR PREFERS [facility], BUT UNFORTUNATELY, THEY ARE NOT CONTRACTED WITH PT'S INSURANCE.  DTR WISHES TO WAIT UNTIL AM TO MAKE DECISION AFTER LOOKING AT OTHER AGENCIES ON LINE.  WILL FOLLOW UP.  10/19/12  Skyylar Kopf,RN,BSN [phone] MET WITH PT AND DAUGHTER DEBBIE TO FOLLOW UP REGARDING ALL OPTIONS FOR DC.  PT CURRENTLY BEING FAXED OUT FOR BED OFFERS FOR ST-SNF FOR REHAB.  DEBBIE STATES IF SNF NOT APPROVED BY INSURANCE SHE WILL ARRANGE TO BE WITH PT 24H/DAY AT DC.  EXPLAINED OPTIONS FOR HH CARE.  PT/DTRS HOPING FOR SKILLED CARE AT DC. WILL FOLLOW PROGRESS.   10/18/12 1055 Henrietta Mayo RN MSN BSN CCM PT recommends SNF for rehab.  Met with pt and daughters @ bedside and explained process - they expressed concern about copay for SNF.  Notified CSW.  10/17/12 1050 Henrietta Mayo RN MSN BSN CCM Pt states her daughters are supportive but work and will not be able to provide 24/7 assistance if needed after discharge.  Await PT/OT recommendations.

## 2012-10-18 NOTE — Progress Notes (Signed)
CARDIAC REHAB PHASE I   PRE:  Rate/Rhythm: 54 JB    BP: sitting 138/44    SaO2: 97 1L, 96 RA  MODE:  Ambulation: 200 ft   POST:  Rate/Rhythm: 66 JR    BP: sitting 151/44     SaO2: 96 RA after 100 ft but 82 RA after walk, up to 92 1L in bed   Tolerated fairly well with RW and assist x1. This was third walk today. Steady with RW, fatigues easily. SaO2 down toward end of walk so reapplied O2 1L in room for nap in bed. Encouraged IS. She sts it hurts to inhale on it. Will f/u. [phone]  Elissa Lovett [place] CES, ACSM 10/18/2012 3:45 PM

## 2012-10-18 NOTE — Progress Notes (Addendum)
TCTS DAILY ICU PROGRESS NOTE                   [address]            [place] [address]          [phone]   7 Days Post-Op Procedure(s) (LRB): TRICUSPID VALVE REPAIR (N/A) MAZE (N/A) INTRAOPERATIVE TRANSESOPHAGEAL ECHOCARDIOGRAM (N/A) MITRAL VALVE REPAIR (MVR) (N/A)  Total Length of Stay:  LOS: 7 days   Subjective: Getting stronger, feels better  Objective: Vital signs in last 24 hours: Temp:  [98 F (36.7 C)-99.3 F (37.4 C)] 98.6 F (37 C) (07/24 0422) Pulse Rate:  [51-97] 73 (07/24 0700) Cardiac Rhythm:  [-] Normal sinus rhythm (07/24 0600) Resp:  [15-32] 20 (07/24 0700) BP: (112-154)/(43-76) 135/48 mmHg (07/24 0700) SpO2:  [95 %-100 %] 99 % (07/24 0700) Weight:  [178 lb 12.7 oz (81.1 kg)] 178 lb 12.7 oz (81.1 kg) (07/24 0600)  Filed Weights   10/16/12 0600 10/17/12 0600 10/18/12 0600  Weight: 178 lb 12.7 oz (81.1 kg) 174 lb 2.6 oz (79 kg) 178 lb 12.7 oz (81.1 kg)    Weight change: 4 lb 10.1 oz (2.1 kg)   Hemodynamic parameters for last 24 hours:    Intake/Output from previous day: 07/23 0701 - 07/24 0700 In: 720 [P.O.:720] Out: 1155 [Urine:1155]  Intake/Output this shift:    Current Meds: Scheduled Meds: . aspirin EC  81 mg Oral Daily  . bisacodyl  10 mg Oral Daily   Or  . bisacodyl  10 mg Rectal Daily  . docusate sodium  200 mg Oral Daily  . glipiZIDE  10 mg Oral Daily  . insulin aspart  0-15 Units Subcutaneous TID WC  . insulin detemir  10 Units Subcutaneous BID  . lisinopril  20 mg Oral Daily  . metFORMIN  1,000 mg Oral BID WC  . multivitamin with minerals  1 tablet Oral Daily  . mupirocin ointment   Nasal BID  . pantoprazole  40 mg Oral Daily  . PARoxetine  30 mg Oral Daily  . sodium chloride  3 mL Intravenous Q12H  . warfarin  3 mg Oral q1800  . Warfarin - Physician Dosing Inpatient   Does not apply q1800   Continuous Infusions: . sodium chloride 250 mL (10/12/12 0542)   PRN Meds:.feeding supplement, hydrALAZINE,  HYDROcodone-acetaminophen, metoprolol, morphine injection, ondansetron (ZOFRAN) IV, sodium chloride  General appearance: alert, cooperative and no distress Heart: regular rate and rhythm and no murmur Lungs: faint left basilar crackles Abdomen: benign exam Extremities: no sig edema noted Wound: incis healing well  Lab Results: CBC: Recent Labs  10/16/12 0355  WBC 8.5  HGB 9.9*  HCT 29.5*  PLT 155   BMET:  Recent Labs  10/16/12 0355  NA 136  K 4.2  CL 101  CO2 29  GLUCOSE 159*  BUN 26*  CREATININE 0.58  CALCIUM 8.8    PT/INR:  Recent Labs  10/18/12 0400  LABPROT 18.3*  INR 1.57*   Radiology: Dg Chest 2 View  10/17/2012   *RADIOLOGY REPORT*  Clinical Data: Postop mitral valve replacement surgery.  CHEST - 2 VIEW  Comparison: 10/16/2012.  Findings: The right IJ central venous Cordis is stable.  The lungs demonstrate improved aeration with resolving edema and atelectasis. A small left effusion persists.  IMPRESSION: Improved lung aeration with resolving edema and atelectasis. Small left effusion.   Original Report Authenticated By: Rudie Meyer, M.D.     Assessment/Plan:  S/P Procedure(s) (LRB): TRICUSPID VALVE REPAIR (N/A) MAZE (N/A) INTRAOPERATIVE TRANSESOPHAGEAL ECHOCARDIOGRAM (N/A) MITRAL VALVE REPAIR (MVR) (N/A)  1 currently in sinus rhythm, does not appear to need pacer at this time 2 cont ac rx 3 sugars controlled 4 BP - most readings well controlled but systolic high at times- monitor on current rx but may have some room to increase ace inhib  5 volume overload appears mostly resolved 6 cont rehab/pulm toilet, ? tx to 2000   GOLD,WAYNE E 10/18/2012 7:53 AM   I have seen and examined the patient and agree with the assessment and plan as outlined.  Currently in NSR but still w/ intermittent episodes Afib.  Restart low dose amiodarone.  Replace lisinopril with Cozaar, which the patient was taking PTA.  Continue coumadin.  Transfer step  down.   Mckenzie Bove H 10/18/2012 9:31 AM

## 2012-10-18 NOTE — Progress Notes (Signed)
CSW spoke with daughter regarding possible SNF d/c. Daughter wanted to know if home health was an option. CSW will consult with Case Manager tomorrow. CSW and daughter will meet tomorrow to talk about d/c options.  Maree Krabbe, MSW, Theresia Majors [phone]

## 2012-10-18 NOTE — Evaluation (Signed)
Physical Therapy Evaluation Patient Details Name: Rachel Santos MRN: [id number] DOB: 02/14/1941 Today's Date: 10/18/2012 Time: 4098-1191 PT Time Calculation (min): 16 min  PT Assessment / Plan / Recommendation History of Present Illness  s/p MVR, Maze , tricuspid repair  Clinical Impression  Pt with decreased activity tolerance, mobility and awareness of precautions who will benefit from acute therapy to maximize independence and mobility to decrease burden of care. Pt does not have 24hr care and unless able to reach modified independent will benefit from ST-SNF at discharge. Will follow.     PT Assessment  Patient needs continued PT services    Follow Up Recommendations  SNF    Does the patient have the potential to tolerate intense rehabilitation      Barriers to Discharge Decreased caregiver support      Equipment Recommendations  Rolling walker with 5" wheels    Recommendations for Other Services     Frequency Min 3X/week    Precautions / Restrictions Precautions Precautions: Sternal Precaution Comments: pt educated for precautions with handout provided   Pertinent Vitals/Pain No pain HR 84 throughout sats 85-92 on Ra with cueing throughout for pursed lip breathing and use of IS end of session with sats 92% on 1L BP 135/48      Mobility  Bed Mobility Bed Mobility: Rolling Left;Left Sidelying to Sit;Sit to Sidelying Left;Sitting - Scoot to Edge of Bed Rolling Left: 5: Supervision Left Sidelying to Sit: 4: Min assist;HOB flat Sitting - Scoot to Edge of Bed: 5: Supervision Sit to Sidelying Left: 5: Supervision;HOB flat Details for Bed Mobility Assistance: cueing for sequence, safety and precautions Transfers Transfers: Sit to Stand;Stand to Sit Sit to Stand: 4: Min guard;From bed;From chair/3-in-1 Stand to Sit: 4: Min guard;To chair/3-in-1;To bed Details for Transfer Assistance: cueing for hand placement and safety with  precautions Ambulation/Gait Ambulation/Gait Assistance: 5: Supervision Ambulation Distance (Feet): 80 Feet Assistive device: Rolling walker Ambulation/Gait Assistance Details: cueing for posture and position in RW. Pt denied further distance as had walked an hour before with RN Gait Pattern: Step-through pattern;Decreased stride length Gait velocity: decreased Stairs: No    Exercises     PT Diagnosis: Difficulty walking  PT Problem List: Decreased activity tolerance;Decreased mobility;Decreased knowledge of use of DME;Decreased knowledge of precautions PT Treatment Interventions: Gait training;Therapeutic exercise;Therapeutic activities;Patient/family education     PT Goals(Current goals can be found in the care plan section) Acute Rehab PT Goals Patient Stated Goal: return home PT Goal Formulation: With patient Time For Goal Achievement: 11/01/12 Potential to Achieve Goals: Fair  Visit Information  Last PT Received On: 10/18/12 Assistance Needed: +1 History of Present Illness: s/p MVR, Maze , tricuspid repair       Prior Functioning  Home Living Family/patient expects to be discharged to:: Private residence Living Arrangements: Alone Available Help at Discharge: Family;Available PRN/intermittently Type of Home: Apartment Home Access: Level entry Home Layout: One level Home Equipment: None Additional Comments: dgtrs are all in the area but work and cannot provide 24hr care. Pt doesn't cook much but cares for herself and drives Prior Function Level of Independence: Independent Communication Communication: HOH    Cognition  Cognition Arousal/Alertness: Awake/alert Behavior During Therapy: WFL for tasks assessed/performed Overall Cognitive Status: Within Functional Limits for tasks assessed    Extremity/Trunk Assessment Upper Extremity Assessment Upper Extremity Assessment: Generalized weakness Lower Extremity Assessment Lower Extremity Assessment: Generalized  weakness Cervical / Trunk Assessment Cervical / Trunk Assessment: Normal   Balance    End of  Session PT - End of Session Equipment Utilized During Treatment: Gait belt Activity Tolerance: Patient tolerated treatment well Patient left: in chair;with call bell/phone within reach Nurse Communication: Mobility status  GP     Delorse Lek 10/18/2012, 9:07 AM Delaney Meigs, PT [phone]

## 2012-10-19 ENCOUNTER — Inpatient Hospital Stay (HOSPITAL_COMMUNITY): Payer: Medicare Other

## 2012-10-19 LAB — GLUCOSE, CAPILLARY
Glucose-Capillary: 162 mg/dL — ABNORMAL HIGH (ref 70–99)
Glucose-Capillary: 121 mg/dL — ABNORMAL HIGH (ref 70–99)

## 2012-10-19 LAB — CBC
HCT: 28.1 % — ABNORMAL LOW (ref 36.0–46.0)
Hemoglobin: 9 g/dL — ABNORMAL LOW (ref 12.0–15.0)
RBC: 3.26 MIL/uL — ABNORMAL LOW (ref 3.87–5.11)
RDW: 15.5 % (ref 11.5–15.5)
WBC: 7.6 10*3/uL (ref 4.0–10.5)

## 2012-10-19 LAB — BASIC METABOLIC PANEL
BUN: 18 mg/dL (ref 6–23)
Chloride: 99 mEq/L (ref 96–112)
GFR calc Af Amer: >90 mL/min (ref >90)
Glucose, Bld: 174 mg/dL — ABNORMAL HIGH (ref 70–99)
Potassium: 4.4 mEq/L (ref 3.5–5.1)

## 2012-10-19 LAB — PROTIME-INR: INR: 1.52 — ABNORMAL HIGH (ref 0.00–1.49)

## 2012-10-19 MED ORDER — INSULIN DETEMIR 100 UNIT/ML ~~LOC~~ SOLN
20.0000 [IU] | Freq: Every day | SUBCUTANEOUS | Status: DC
  Administered 2012-10-19 – 2012-10-23 (×5): 20 [IU] via SUBCUTANEOUS
  Filled 2012-10-19 (×7): qty 0.2

## 2012-10-19 MED ORDER — AMIODARONE HCL 100 MG PO TABS
100.0000 mg | ORAL_TABLET | Freq: Every day | ORAL | Status: DC
  Administered 2012-10-19 – 2012-10-23 (×5): 100 mg via ORAL
  Filled 2012-10-19 (×5): qty 1

## 2012-10-19 MED ORDER — INSULIN DETEMIR 100 UNIT/ML ~~LOC~~ SOLN
10.0000 [IU] | Freq: Every day | SUBCUTANEOUS | Status: DC
  Administered 2012-10-19 – 2012-10-20 (×2): 10 [IU] via SUBCUTANEOUS
  Filled 2012-10-19 (×2): qty 0.1

## 2012-10-19 NOTE — Progress Notes (Signed)
CARDIAC REHAB PHASE I   PRE:  Rate/Rhythm: 49SB  BP:  Supine:   Sitting: 134/40  Standing:    SaO2: 98%2L, 90%RA  MODE:  Ambulation: 550 ft   POST:  Rate/Rhythm: 61SR  BP:  Supine:   Sitting: 131/76  Standing:    SaO2: 87%RA,  2L to keep sats up , 98%2L room [phone] To BSC prior to walk. 90% RA at that time. Pt walked 550 ft with rolling walker and desat to 87% on RA with just short distance so put back on 2L. To BSC after walk with no BM results. To recliner after walk and left on 2L. Encouraged IS. Pt took a couple of standing rest breaks. Daughter in room.  Luetta Nutting, RN BSN  10/19/2012 9:46 AM

## 2012-10-19 NOTE — Discharge Summary (Signed)
[address]       [place] [address]             [phone]              Discharge Summary  Name: Rachel Santos DOB: 08-17-1940 72 y.o. 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 Diabetes mellitus   . Arthritis   . Hypertension   . Atrial fibrillation   . Mitral regurgitation   . GERD (gastroesophageal reflux disease)   . Cataracts, bilateral   . PONV (postoperative nausea and vomiting)   . Tricuspid regurgitation   . Shortness of breath     with activity  . Stroke     mini stroke with vision problems  . 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 S/P Maze operation for atrial fibrillation 10/11/2012    Complete bilateral atrial lesion set using bipolar radiofrequency and cryothermy ablation with clipping of LA appendage       Procedures (10/11/2012):  Mitral Valve Repair  26 mm Sorin Memo 3D ring annuloplasty Tricuspid Valve Repair  26 mm Edwards MC3 ring annuloplasty Maze Procedure   complete biatrial lesion set using bipolar radiofrequency and cryothermy ablation   clipping of left atrial appendage     HPI:  The patient is a 72 y.o. female with a known history of heart murmur.  She denies any known history of rheumatic fever or rheumatic heart disease, and she states  that when she was evaluated for possible heart disease in the distant past she was told that there was nothing significant to be concerned about. She has been followed for several years by her primary care physician with hypertension, type 2 diabetes mellitus, and osteoarthritis. She describes a gradual progression of exertional shortness of breath and fatigue over the last few years. In recent months, she has developed more significant exertional shortness of breath with occasional episodes of resting shortness of breath. In early April, the patient had a brief episode of transient monocular blindness that lasted less than 30 minutes. She initially went to her ophthalmologist who was concerned about a possible TIA and subsequently referred her to her primary care physician, Dr. Nicholos Johns. She was noted to be in atrial fibrillation at that time, and she was started on Xarelto and metoprolol and referred for cardiology consultation. She underwent 2D echocardiogram and was evaluated by Dr.McAlhany on 07/03/2012. Transthoracic echocardiogram demonstrated the presence of normal left ventricular size and systolic function with severe mitral regurgitation and moderate tricuspid regurgitation. A carotid duplex scan was performed revealing no significant extracranial carotid artery stenosis. She was scheduled for elective transesophageal echocardiogram with DC cardioversion on 07/31/2012. Transesophageal echocardiogram confirmed the presence of severe mitral regurgitation. The patient was cardioverted to sinus rhythm but had reverted back into persistent atrial fibrillation by the time of her next office visit on 08/16/2012. She subsequently underwent left and right heart catheterization revealing mild non-obstructive  coronary artery disease with severe mitral regurgitation and preserved LV systolic function. She was  referred to Dr. Cornelius Moras for elective surgical consultation. He felt she would benefit from mitral valve  repair or replacement with tricuspid valve repair and Maze procedure. All risks, benefits and alternatives of surgery were explained in detail, and the patient agreed to proceed.    Hospital Course:  The patient was admitted to [facility] on 10/11/2012. The patient was taken to the operating room and underwent the above procedure.    The postoperative course has been notable for rate controlled atrial fibrillation, bradycardia and junctional rhythm requiring AAI pacing.  An electrophysiology consult was obtained for consideration of permanent pacemaker placement.  With conservative management, she has returned to normal sinus rhythm, and it is felt that she will not require a pacemaker at this point.  She does continue to have intermittent atrial fibrillation, and she has been started on low dose Amiodarone and Coumadin.  Her blood pressures have been mildly elevated and she has been started on Cozaar.    Otherwise, the patient is doing well.  Lasix has been started for mild volume overload, and she is diuresing well.  She has been deconditioned, and is working with physical therapy.  Incisions are healing well.  She has remained afebrile and vital signs are stable.  Initially, she was going to go to a SNF;however, she is progressing well and would like to go home with her daughter. Home health, home PT, and oxygen will be arranged prior to her discharger.  She is presently medically stable for discharge on today's date.   Recent vital signs:  Filed Vitals:   10/23/12 0440  BP: 137/64  Pulse: 57  Temp: 99.1 F (37.3 C)  Resp: 18    Recent laboratory studies:  CBC:No results found for this basename: WBC, HGB, HCT, PLT,  in the last 72 hours BMET: No results found for this basename: NA, K, CL, CO2, GLUCOSE, BUN, CREATININE, CALCIUM,  in the last 72 hours  PT/INR:   Recent Labs  10/23/12 0615  LABPROT 20.5*  INR 1.82*     Discharge Medications:     Medication List    STOP taking  these medications       meloxicam 7.5 MG tablet  Commonly known as:  MOBIC     metoprolol 50 MG tablet  Commonly known as:  LOPRESSOR      TAKE these medications       acetaminophen 500 MG tablet  Commonly known as:  TYLENOL  Take 1,000 mg by mouth every 6 (six) hours as needed for pain.     amiodarone 100 MG tablet  Commonly known as:  PACERONE  Take 1 tablet (100 mg total) by mouth daily.     amLODipine 10 MG tablet  Commonly known as:  NORVASC  Take 1 tablet (10 mg total) by mouth daily.     aspirin 81 MG EC tablet  Take 1 tablet (81 mg total) by mouth daily.     calcium carbonate 200 MG capsule  Calcium citrate  /vitamin D 630mg  (calicum)  1 tab daily     furosemide 40 MG tablet  Commonly known as:  LASIX  Take 1 tablet (40 mg total) by mouth daily.     glipiZIDE 10 MG 24 hr tablet  Commonly known as:  GLUCOTROL XL  Take 10 mg by mouth daily.     HUMALOG KWIKPEN 100 UNIT/ML Sopn  Generic drug:  insulin  lispro  Inject 12-15 Units into the skin daily. After dinner.     HYDROcodone-acetaminophen 5-325 MG per tablet  Commonly known as:  NORCO/VICODIN  Take 1-2 tablets by mouth every 4 (four) hours as needed for pain.     Lancets 28G Misc  Lancets and test strips     LEVEMIR FLEXPEN 100 UNIT/ML Sopn  Generic drug:  Insulin Detemir  Inject 20-40 Units into the skin 2 (two) times daily. 40 units AM, 20 units pm     losartan 100 MG tablet  Commonly known as:  COZAAR  Take 1 tablet (100 mg total) by mouth daily.     metFORMIN 500 MG tablet  Commonly known as:  GLUCOPHAGE  Take 1,000 mg by mouth 2 (two) times daily with a meal.     multivitamin tablet  Take 1 tablet by mouth daily.     omeprazole 40 MG capsule  Commonly known as:  PRILOSEC  Take 40 mg by mouth daily.     PARoxetine 30 MG tablet  Commonly known as:  PAXIL  Take 30 mg by mouth daily.     potassium chloride SA 20 MEQ tablet  Commonly known as:  K-DUR,KLOR-CON  Take 1 tablet (20 mEq  total) by mouth daily.     warfarin 3 MG tablet  Commonly known as:  COUMADIN  Take 1 tablet (3 mg total) by mouth daily at 6 PM. Or as directed.       The patient has been discharged on:   1.Beta Blocker:  Yes [   ]                              No   [ x  ]                              If No, reason: Bradycardia  2.Ace Inhibitor/ARB: Yes [ x  ]                                     No  [    ]                                     If No, reason:  3.Statin:   Yes [   ]                  No  [ x  ]                  If No, reason: No hyperlipidemia, CAD  4.Marlowe Kays:  Yes  [  x ]                  No   [   ]                  If No, reason:  Discharge Instructions:  The patient is to refrain from driving, heavy lifting or strenuous activity.  May shower daily and clean incisions with soap and water.  May resume regular diet.   Follow Up:   Follow-up Information   Follow up with Purcell Nails, MD On 11/12/2012. (Have a chest x-ray at [facility] at 3:30, then see MD at 4:30)  Contact information:   [address] [employer] [address] [place] [address] [phone]       Follow up with Olga Millers, MD. Schedule an appointment as soon as possible for a visit in 2 weeks.   Contact information:   [address] [place], [place] [address]                         [address] [place] [address] [phone]       Follow up with Home Health. (Please draw a PT/INR (bloodwork for Coumadin) on Thursday 10/25/2012 and have results called to Dr. Ludwig Clarks office)        Ardelle Balls PA-C 10/23/2012, 8:43 AM

## 2012-10-19 NOTE — Progress Notes (Signed)
Epicardial Pacing Wires removed at 1500 per order with protocol followed.  Patient lied flat during and after procedure, tolerating well.  Vital signs monitored per protocol. Will continue to monitor.

## 2012-10-19 NOTE — Progress Notes (Signed)
Clinical Social Work Department BRIEF PSYCHOSOCIAL ASSESSMENT 10/19/2012  Patient:  Rachel Santos, Rachel Santos     Account Number:  [id number]     Admit date:  10/11/2012  Clinical Social Worker:  Carren Rang  Date/Time:  10/19/2012 12:00 M  Referred by:  Care Management  Date Referred:  10/18/2012 Referred for  SNF Placement   Other Referral:   Interview type:  Other - See comment Other interview type:   CSW spoke with patient and family at bedside    PSYCHOSOCIAL DATA Living Status:  ALONE Admitted from facility:   Level of care:   Primary support name:  Eunice Blase [phone] Primary support relationship to patient:  CHILD, ADULT Degree of support available:   GOOD    CURRENT CONCERNS Current Concerns  Post-Acute Placement   Other Concerns:    SOCIAL WORK ASSESSMENT / PLAN Clinical Social Worker received referral for SNF placement at d/c. CSW introduced self and explained reason for visit. Patient had visitors by bedside. CSW explained SNF process and provided SNF packet to patient and family. Patient and family reported she is agreeable for SNF placement if copay is doable. Family states they will call insurance company to determine copay. They stated their preference is [facility] and [facility]. CSW encouraged patient and family to think about additional SNF options pending availability of preferred facility. CSW will complete FL2 for MD's signature and will update patient and family when bed offers are received.   Assessment/plan status:  Psychosocial Support/Ongoing Assessment of Needs Other assessment/ plan:   Information/referral to community resources:   SNF    PATIENT'S/FAMILY'S RESPONSE TO PLAN OF CARE: Patient and family states they were receptive to SNF option as long as the copay was affordable. Patient has good support from family.       Maree Krabbe, MSW, Theresia Majors [phone]

## 2012-10-19 NOTE — Progress Notes (Addendum)
      [address]       [place] [address]             [phone]     CARDIOTHORACIC SURGERY PROGRESS NOTE  8 Days Post-Op  S/P Procedure(s) (LRB): TRICUSPID VALVE REPAIR (N/A) MAZE (N/A) INTRAOPERATIVE TRANSESOPHAGEAL ECHOCARDIOGRAM (N/A) MITRAL VALVE REPAIR (MVR) (N/A)  Subjective: Feels well.  Slept well.  Appetite slowly improving.  Still gets SOB with ambulation.  Objective: Vital signs in last 24 hours: Temp:  [98.5 F (36.9 C)-98.8 F (37.1 C)] 98.8 F (37.1 C) (07/25 0512) Pulse Rate:  [51-76] 51 (07/25 0512) Cardiac Rhythm:  [-] Sinus bradycardia (07/25 0810) Resp:  [17-24] 18 (07/25 0512) BP: (134-152)/(34-57) 149/57 mmHg (07/25 0512) SpO2:  [98 %-100 %] 100 % (07/25 0512) Weight:  [80.6 kg (177 lb 11.1 oz)] 80.6 kg (177 lb 11.1 oz) (07/25 0512)  Physical Exam:  Rhythm:   Sinus 50's  Breath sounds: Few bibasilar crackles  Heart sounds:  RRR w/out murmur  Incisions:  Clean and dry  Abdomen:  soft  Extremities:  Warm, no edema   Intake/Output from previous day: 07/24 0701 - 07/25 0700 In: 720 [P.O.:720] Out: 150 [Urine:150] Intake/Output this shift:    Lab Results:  Recent Labs  10/19/12 0425  WBC 7.6  HGB 9.0*  HCT 28.1*  PLT 343   BMET:  Recent Labs  10/19/12 0425  NA 138  K 4.4  CL 99  CO2 32  GLUCOSE 174*  BUN 18  CREATININE 0.61  CALCIUM 9.6    CBG (last 3)   Recent Labs  10/18/12 0810 10/18/12 2135 10/19/12 0610  GLUCAP 77 190* 168*   PT/INR:   Recent Labs  10/19/12 0425  LABPROT 17.9*  INR 1.52*    CXR:  *RADIOLOGY REPORT*  Clinical Data: Follow up atelectasis  CHEST - 2 VIEW  Comparison: Prior radiograph from 10/17/2012  Findings: Right IJ Cordis catheter has been removed. Median  sternotomy wires are intact. Heart size is stable. There is  improved aeration at both lung bases, particularly on the left.  Some left basilar atelectasis persists then in addition, a small  left pleural  effusion persist as well.  IMPRESSION:  1. Improved lung aeration with persistent but improved left  basilar atelectasis.  2. Slight interval decrease in left pleural effusion.  Original Report Authenticated By: Rise Mu, M.D.    Assessment/Plan: S/P Procedure(s) (LRB): TRICUSPID VALVE REPAIR (N/A) MAZE (N/A) INTRAOPERATIVE TRANSESOPHAGEAL ECHOCARDIOGRAM (N/A) MITRAL VALVE REPAIR (MVR) (N/A)  Overall Rachel Santos continues to slowly improve Maintaining NSR for the most part w/ some rate-controlled Afib Unclear whether or not she has adequate chronotropic response to exercise, but o/w no indication for pacer at this time Expected post op acute blood loss anemia, mild, stable Expected post op volume excess, mild, diuresing, weight still slightly above baseline Chronic combined systolic and diastolic CHF, stable - improving Hypertension under reasonable control Type II diabetes mellitus, reasonable glycemic control Physical deconditioning, slowly improving    Continue low dose amiodarone for now  Continue coumadin  Mobilize, PT  Low dose daily diuretic  Continue Cozaar  Continue oral agents for DMII and increase am dose of levemir, ultimately should be able to resume pre-admission insulin regemin  Possible d/c early next week - ? Temporary SNF vs home with daughter and home health PT   Purcell Nails 10/19/2012 8:21 AM

## 2012-10-19 NOTE — Progress Notes (Signed)
Clinical Social Work Department CLINICAL SOCIAL WORK PLACEMENT NOTE 10/19/2012  Patient:  DAZIAH, HESLER  Account Number:  [id number] Admit date:  10/11/2012  Clinical Social Worker:  Carren Rang  Date/time:  10/19/2012 12:59 PM  Clinical Social Work is seeking post-discharge placement for this patient at the following level of care:   SKILLED NURSING   (*CSW will update this form in Epic as items are completed)   10/19/2012  Patient/family provided with [facility] Department of Clinical Social Work's list of facilities offering this level of care within the geographic area requested by the patient (or if unable, by the patient's family).  10/19/2012  Patient/family informed of their freedom to choose among providers that offer the needed level of care, that participate in Medicare, Medicaid or managed care program needed by the patient, have an available bed and are willing to accept the patient.  10/19/2012  Patient/family informed of [facility]' ownership interest in [facility], as well as of the fact that they are under no obligation to receive care at this facility.  PASARR submitted to EDS on 10/18/2012 PASARR number received from EDS on 10/18/2012  FL2 transmitted to all facilities in geographic area requested by pt/family on  10/19/2012 FL2 transmitted to all facilities within larger geographic area on   Patient informed that his/her managed care company has contracts with or will negotiate with  certain facilities, including the following:     Patient/family informed of bed offers received:   Patient chooses bed at  Physician recommends and patient chooses bed at    Patient to be transferred to  on   Patient to be transferred to facility by   The following physician request were entered in Epic:   Additional Comments:  Maree Krabbe, MSW, [employer] [phone]

## 2012-10-19 NOTE — Progress Notes (Addendum)
Occupational Therapy Evaluation Patient Details Name: Rachel Santos MRN: [id number] DOB: 04-19-1940 Today's Date: 10/19/2012 Time: 4098-1191 OT Time Calculation (min): 27 min  OT Assessment / Plan / Recommendation History of present illness s/p MVR, Maze , tricuspid repair   Clinical Impression   PTA, pt lived alone independently. Pt presents with decreased independence with ADL and mobility for ADL. Pt is not safe to D/C home alone at this time. Pt will benefit form rehab at SNF, however, family is concerned about cost. If SNF is not an option due to financial concerns, then pt will need 24/7 assist and HHOT/PT and equipment below. Have discussed with family and they are in agreement with POC.    OT Assessment  Patient needs continued OT Services    Follow Up Recommendations  SNF    Barriers to Discharge      Equipment Recommendations  3 in 1 bedside comode    Recommendations for Other Services    Frequency  Min 2X/week    Precautions / Restrictions Precautions Precautions: Sternal Precaution Comments: pt educated for precautions with handout provided Restrictions Weight Bearing Restrictions: Yes (sternal precautions)   Pertinent Vitals/Pain desat RA to 89 after ambulating 20 ft. Rebounds quickly on 2L    ADL  Eating/Feeding: Modified independent Where Assessed - Eating/Feeding: Chair Grooming: Set up;Supervision/safety Where Assessed - Grooming: Unsupported sitting Upper Body Bathing: Set up;Supervision/safety Where Assessed - Upper Body Bathing: Unsupported sitting Lower Body Bathing: Minimal assistance Where Assessed - Lower Body Bathing: Supported sit to stand Upper Body Dressing: Minimal assistance Where Assessed - Upper Body Dressing: Unsupported sitting Lower Body Dressing: Minimal assistance Where Assessed - Lower Body Dressing: Supported sit to stand Toilet Transfer: Minimal assistance Toilet Transfer Method: Sit to Barista:  Bedside commode Toileting - Clothing Manipulation and Hygiene: Minimal assistance Where Assessed - Engineer, mining and Hygiene: Sit to stand from 3-in-1 or toilet Equipment Used: Rolling walker;Gait belt Transfers/Ambulation Related to ADLs: min A. Difficulty getting up from lower surface    OT Diagnosis: Generalized weakness  OT Problem List: Decreased strength;Decreased activity tolerance;Decreased safety awareness;Decreased knowledge of precautions;Cardiopulmonary status limiting activity;Obesity;Pain OT Treatment Interventions: Self-care/ADL training;Therapeutic exercise;DME and/or AE instruction;Therapeutic activities;Patient/family education   OT Goals(Current goals can be found in the care plan section) Acute Rehab OT Goals Patient Stated Goal: return home OT Goal Formulation: With patient Time For Goal Achievement: 11/02/12 Potential to Achieve Goals: Good  Visit Information  Last OT Received On: 10/19/12 Assistance Needed: +1 History of Present Illness: s/p MVR, Maze , tricuspid repair       Prior Functioning     Home Living Family/patient expects to be discharged to:: Private residence Living Arrangements: Alone Available Help at Discharge: Family;Available PRN/intermittently Type of Home: Apartment Home Access: Level entry Home Layout: One level Home Equipment: None Additional Comments: dgtrs are all in the area but work and cannot provide 24hr care. Pt doesn't cook much but cares for herself and drives Prior Function Level of Independence: Independent Communication Communication: HOH         Vision/Perception     Cognition  Cognition Arousal/Alertness: Awake/alert Behavior During Therapy: WFL for tasks assessed/performed Overall Cognitive Status: Within Functional Limits for tasks assessed    Extremity/Trunk Assessment Upper Extremity Assessment Upper Extremity Assessment: Generalized weakness Lower Extremity Assessment Lower  Extremity Assessment: Defer to PT evaluation Cervical / Trunk Assessment Cervical / Trunk Assessment: Normal     Mobility Bed Mobility Bed Mobility: Not assessed Transfers Transfers: Sit  to Stand;Stand to Sit Sit to Stand: 4: Min assist;Without upper extremity assist;From chair/3-in-1 Stand to Sit: 4: Min assist;Without upper extremity assist;To chair/3-in-1 Details for Transfer Assistance: cueing for hand placement and safety with precautions     Exercise     Balance  S   End of Session OT - End of Session Equipment Utilized During Treatment: Gait belt;Rolling walker Activity Tolerance: Patient limited by fatigue Patient left: in chair;with call bell/phone within reach;with family/visitor present Nurse Communication: Mobility status  GO     Saliyah Gillin,HILLARY 10/19/2012, 12:31 PM [facility], OTR/L  [phone] 10/19/2012

## 2012-10-19 NOTE — Plan of Care (Signed)
Problem: Acute Rehab OT Goals Goal: Pt. Will Transfer To Toilet With caregiver independent  In care

## 2012-10-20 LAB — GLUCOSE, CAPILLARY
Glucose-Capillary: 176 mg/dL — ABNORMAL HIGH (ref 70–99)
Glucose-Capillary: 184 mg/dL — ABNORMAL HIGH (ref 70–99)

## 2012-10-20 MED ORDER — INSULIN DETEMIR 100 UNIT/ML ~~LOC~~ SOLN
6.0000 [IU] | Freq: Every day | SUBCUTANEOUS | Status: DC
  Administered 2012-10-21 – 2012-10-22 (×2): 6 [IU] via SUBCUTANEOUS
  Filled 2012-10-20 (×4): qty 0.06

## 2012-10-20 MED ORDER — LOPERAMIDE HCL 2 MG PO CAPS: 2.0000 mg | ORAL_CAPSULE | ORAL | Status: DC | PRN

## 2012-10-20 NOTE — Progress Notes (Addendum)
       [address]       [place] [address]             [phone]          9 Days Post-Op Procedure(s) (LRB): TRICUSPID VALVE REPAIR (N/A) MAZE (N/A) INTRAOPERATIVE TRANSESOPHAGEAL ECHOCARDIOGRAM (N/A) MITRAL VALVE REPAIR (MVR) (N/A)  Subjective: Feels a little better this am.  Finally felt like eating. Still weak, but overall stable.   Objective: Vital signs in last 24 hours: Patient Vitals for the past 24 hrs:  BP Temp Temp src Pulse Resp SpO2 Weight  10/20/12 0440 136/43 mmHg 98.8 F (37.1 C) Oral 50 18 99 % 179 lb 3.7 oz (81.3 kg)  10/19/12 2034 145/40 mmHg 98.5 F (36.9 C) Oral 55 18 99 % -  10/19/12 1800 145/54 mmHg - - - - - -  10/19/12 1600 169/52 mmHg - - 57 - 99 % -  10/19/12 1545 148/46 mmHg - - 57 - - -  10/19/12 1530 145/55 mmHg - - 56 - - -  10/19/12 1515 148/46 mmHg - - 56 - - -  10/19/12 1500 168/50 mmHg - - 60 - 99 % -  10/19/12 1445 158/46 mmHg - - 53 - - -  10/19/12 1347 - 97.3 F (36.3 C) - - - - -  10/19/12 1300 169/44 mmHg - - 51 - 99 % -   Current Weight  10/20/12 179 lb 3.7 oz (81.3 kg)     Intake/Output from previous day: 07/25 0701 - 07/26 0700 In: 340 [P.O.:340] Out: 1650 [Urine:1650]  CBGs [phone]    PHYSICAL EXAM:  Heart: Brady in 50s Lungs: Diminished BS in bases bilaterally Wound: Clean and dry Extremities: No edema    Lab Results: CBC: Recent Labs  10/19/12 0425  WBC 7.6  HGB 9.0*  HCT 28.1*  PLT 343   BMET:  Recent Labs  10/19/12 0425  NA 138  K 4.4  CL 99  CO2 32  GLUCOSE 174*  BUN 18  CREATININE 0.61  CALCIUM 9.6    PT/INR:  Recent Labs  10/20/12 0420  LABPROT 20.2*  INR 1.78*      Assessment/Plan: S/P Procedure(s) (LRB): TRICUSPID VALVE REPAIR (N/A) MAZE (N/A) INTRAOPERATIVE TRANSESOPHAGEAL ECHOCARDIOGRAM (N/A) MITRAL VALVE REPAIR (MVR) (N/A)  CV- sinus brady/slow AF.  Continue Amio, Coumadin.  BPs trending up. Back on home dose of Cozaar.  Will  monitor.  DM- some low blood sugars overnight.  Will watch now that she is eating better.  May need to decrease Levemir. Continue po meds.  CHF/vol overload- diurese.  Deconditioning- Continue PT.   LOS: 9 days    COLLINS,GINA H 10/20/2012  patient examined and medical record reviewed,agree with above note. Will reduce Levemir p.m. dose.  VAN TRIGT III,Hareem Surowiec 10/20/2012

## 2012-10-20 NOTE — Progress Notes (Signed)
Patient complaining of diarrhea x 8, chills, headache 7/10 and "seeing spots". PA Coral Ceo notified of symptoms. V/S stable b/p 147/45, hrt rate 60, temp 98.6. New orders entered. Will continue to monitor.    Demisha Nokes,RN,BSN

## 2012-10-20 NOTE — Progress Notes (Signed)
CARDIAC REHAB PHASE I   PRE:  Rate/Rhythm: 52 afib  BP:  Supine:   Sitting: 153/63  Standing:    SaO2: 100% 2L  MODE:  Ambulation: 350 ft   POST:  Rate/Rhythem: 56 afib  BP:  Supine:   Sitting: 124/63  Standing:    SaO2: 100% ra-90%ra when sitting Oxygen reapplied at 1L 1045-1151 Pt ambulated in hallway x1 assist with rolling walker. Slow steady gait.  Pt took 4 standing rest breaks.  Pt able to ambulate last 100 feet on room air without dyspnea.  However dyspnea returned with sitting . Pt encouraged in [place] breathing. Pt also instructed in good pulmonary toilet. Pt education complete.  Pt oriented to CRPII.  At pt and dtr request referral will be sent to both [facility] and [facility] outpatient cardiac rehab program (dtr lives in [place]).  Understanding verbalized   Dan Europe

## 2012-10-21 LAB — GLUCOSE, CAPILLARY
Glucose-Capillary: 85 mg/dL (ref 70–99)
Glucose-Capillary: 95 mg/dL (ref 70–99)

## 2012-10-21 LAB — PROTIME-INR
INR: 1.78 — ABNORMAL HIGH (ref 0.00–1.49)
Prothrombin Time: 20.2 seconds — ABNORMAL HIGH (ref 11.6–15.2)

## 2012-10-21 MED ORDER — LOSARTAN POTASSIUM 50 MG PO TABS
100.0000 mg | ORAL_TABLET | Freq: Every day | ORAL | Status: DC
  Administered 2012-10-21 – 2012-10-23 (×3): 100 mg via ORAL
  Filled 2012-10-21 (×3): qty 2

## 2012-10-21 NOTE — Progress Notes (Addendum)
       [address]       [place] [address]             [phone]          10 Days Post-Op Procedure(s) (LRB): TRICUSPID VALVE REPAIR (N/A) MAZE (N/A) INTRAOPERATIVE TRANSESOPHAGEAL ECHOCARDIOGRAM (N/A) MITRAL VALVE REPAIR (MVR) (N/A)  Subjective: Had a headache off and on all afternoon yesterday, but feels better this am.  Appetite improved.   Objective: Vital signs in last 24 hours: Patient Vitals for the past 24 hrs:  BP Temp Temp src Pulse Resp SpO2 Weight  10/21/12 0759 136/62 mmHg - - 60 - 96 % -  10/21/12 0654 177/56 mmHg - - - - - -  10/21/12 0532 - 98.2 F (36.8 C) - 52 20 97 % -  10/21/12 0500 - - - - - - 173 lb 8 oz (78.7 kg)  10/20/12 2211 120/40 mmHg - - - - - -  10/20/12 2020 194/52 mmHg 98.8 F (37.1 C) Oral 62 18 93 % -  10/20/12 1305 147/45 mmHg 98.6 F (37 C) Oral 57 16 98 % -   Current Weight  10/21/12 173 lb 8 oz (78.7 kg)     Intake/Output from previous day: 07/26 0701 - 07/27 0700 In: 366 [P.O.:360; I.V.:6] Out: 1004 [Urine:1000; Stool:4]  CBGs  176-184-75-125    PHYSICAL EXAM:  Heart: RRR, brady in 50s Lungs: Clear Wound: Clean and dry Extremities: No significant LE edema   Lab Results: CBC: Recent Labs  10/19/12 0425  WBC 7.6  HGB 9.0*  HCT 28.1*  PLT 343   BMET:  Recent Labs  10/19/12 0425  NA 138  K 4.4  CL 99  CO2 32  GLUCOSE 174*  BUN 18  CREATININE 0.61  CALCIUM 9.6    PT/INR:  Recent Labs  10/21/12 0517  LABPROT 20.2*  INR 1.78*      Assessment/Plan: S/P Procedure(s) (LRB): TRICUSPID VALVE REPAIR (N/A) MAZE (N/A) INTRAOPERATIVE TRANSESOPHAGEAL ECHOCARDIOGRAM (N/A) MITRAL VALVE REPAIR (MVR) (N/A)  CV- sinus brady/slow AF. Continue Amio, Coumadin. BPs remain high. Back on home dose of Cozaar. Will increase and watch.  May need additional agent- she was on Lopressor at home, but will not resume that as she has been so bradycardic.  DM- sugars better with decreased  Levemir. Continue po meds.   CHF/vol overload- diurese.   Deconditioning- Continue PT.  Disp- SNF next week if she continues to progress.    LOS: 10 days    COLLINS,GINA H 10/21/2012  patient examined and medical record reviewed,agree with above note. VAN TRIGT III,PETER 10/21/2012

## 2012-10-22 LAB — GLUCOSE, CAPILLARY
Glucose-Capillary: 107 mg/dL — ABNORMAL HIGH (ref 70–99)
Glucose-Capillary: 160 mg/dL — ABNORMAL HIGH (ref 70–99)

## 2012-10-22 LAB — PROTIME-INR: INR: 1.8 — ABNORMAL HIGH (ref 0.00–1.49)

## 2012-10-22 MED ORDER — WARFARIN SODIUM 3 MG PO TABS: 3.0000 mg | ORAL_TABLET | Freq: Every day | ORAL | Status: DC

## 2012-10-22 MED ORDER — FUROSEMIDE 40 MG PO TABS: 40.0000 mg | ORAL_TABLET | Freq: Every day | ORAL | Status: DC

## 2012-10-22 MED ORDER — LOSARTAN POTASSIUM 100 MG PO TABS: 100.0000 mg | ORAL_TABLET | Freq: Every day | ORAL | Status: DC

## 2012-10-22 MED ORDER — HYDROCODONE-ACETAMINOPHEN 5-325 MG PO TABS: 1.0000 | ORAL_TABLET | ORAL | Status: DC | PRN

## 2012-10-22 MED ORDER — POTASSIUM CHLORIDE CRYS ER 20 MEQ PO TBCR: 20.0000 meq | EXTENDED_RELEASE_TABLET | Freq: Every day | ORAL | Status: DC

## 2012-10-22 MED ORDER — AMLODIPINE BESYLATE 10 MG PO TABS
10.0000 mg | ORAL_TABLET | Freq: Every day | ORAL | Status: DC
  Administered 2012-10-22 – 2012-10-23 (×2): 10 mg via ORAL
  Filled 2012-10-22 (×2): qty 1

## 2012-10-22 MED ORDER — AMIODARONE HCL 100 MG PO TABS: 100.0000 mg | ORAL_TABLET | Freq: Every day | ORAL | Status: DC

## 2012-10-22 MED ORDER — ASPIRIN 81 MG PO TBEC: 81.0000 mg | DELAYED_RELEASE_TABLET | Freq: Every day | ORAL | Status: DC

## 2012-10-22 MED ORDER — AMLODIPINE BESYLATE 10 MG PO TABS: 10.0000 mg | ORAL_TABLET | Freq: Every day | ORAL | Status: DC

## 2012-10-22 MED ORDER — COUMADIN BOOK
Freq: Once | Status: AC
  Administered 2012-10-22: 18:00:00
  Filled 2012-10-22: qty 1

## 2012-10-22 NOTE — Progress Notes (Signed)
CARDIAC REHAB PHASE I   PRE:  Rate/Rhythm: 54 afib  BP:  Supine:   Sitting: 158/60  Standing:    SaO2: 93%RA   MODE:  Ambulation: 350 ft   POST:  Rate/Rhythm: 59 afib  BP:  Supine:   Sitting: 150/50  Standing:    SaO2: 85%RA door, 2L 90-99%  SATURATION QUALIFICATIONS: (This note is used to comply with regulatory documentation for home oxygen)  Patient Saturations on Room Air at Rest = 93%  Patient Saturations on Room Air while Ambulating = 85%  Patient Saturations on 2 Liters of oxygen while Ambulating = 90-99%  Please briefly explain why patient needs home oxygen: desat on RA, needs 2L to keep sats up  [phone] Pt not feeling as well today. Documented need for home oxygen. Pt walked 350 ft with rolling walker and asst x 1. Desat on RA to 85% by the time we reached the door. Took 2L to keep sats up. Pt on 1L when lying down. To bed after walk. Pt c/o right knee hurting. States she needs surgery on it. Pt stated she felt weak today.   Luetta Nutting, RN BSN  10/22/2012 9:35 AM

## 2012-10-22 NOTE — Progress Notes (Addendum)
      [address]       [place] [address]             [phone]        11 Days Post-Op Procedure(s) (LRB): TRICUSPID VALVE REPAIR (N/A) MAZE (N/A) INTRAOPERATIVE TRANSESOPHAGEAL ECHOCARDIOGRAM (N/A) MITRAL VALVE REPAIR (MVR) (N/A)  Subjective: Patient with a headache this morning. She states she gets headaches when her blood pressure goes too high.  Objective: Vital signs in last 24 hours: Temp:  [97.8 F (36.6 C)-98.2 F (36.8 C)] 98.2 F (36.8 C) (07/28 0521) Pulse Rate:  [49-60] 49 (07/28 0521) Cardiac Rhythm:  [-] Junctional rhythm;Atrial fibrillation (07/27 2035) Resp:  [18-19] 18 (07/28 0521) BP: (132-165)/(41-62) 146/53 mmHg (07/28 0521) SpO2:  [94 %-98 %] 94 % (07/28 0521) Weight:  [77.2 kg (170 lb 3.1 oz)] 77.2 kg (170 lb 3.1 oz) (07/28 0548)  Pre op weight  78 kg Current Weight  10/22/12 77.2 kg (170 lb 3.1 oz)      Intake/Output from previous day: 07/27 0701 - 07/28 0700 In: 480 [P.O.:480] Out: 1601 [Urine:1600; Stool:1]   Physical Exam:  Cardiovascular: RRR, no murmurs, gallops, or rubs. Pulmonary: Clear to auscultation bilaterally; no rales, wheezes, or rhonchi. Abdomen: Soft, non tender, bowel sounds present. Extremities: Mild bilateral lower extremity edema. Wounds: Clean and dry.  No erythema or signs of infection.  Lab Results: CBC:No results found for this basename: WBC, HGB, HCT, PLT,  in the last 72 hours BMET: No results found for this basename: NA, K, CL, CO2, GLUCOSE, BUN, CREATININE, CALCIUM,  in the last 72 hours  PT/INR:  Lab Results  Component Value Date   INR 1.80* 10/22/2012   INR 1.78* 10/21/2012   INR 1.78* 10/20/2012   ABG:  INR: Will add last result for INR, ABG once components are confirmed Will add last 4 CBG results once components are confirmed  Assessment/Plan:  1. CV - SB, slow AF. On Amiodarone 100 daily, Cozaar 100 daily, and Coumadin. Still hypertensive.  Will add Norvasc. 2.  Pulmonary -  Encourage incentive spirometer. On 1 liter of oxygen via [place]. 3. Volume Overload - On Lasix 40 daily 4.  Acute blood loss anemia - Last H and H 9 and 28.1 5.DM-CBGs 85/95/107. On Glipizide 10 daily, Metformin 1000 bid, and Levemir. Pre op HGA1C 6.3 6.Continue with PT 7.Loose stools so stop stool softeners 8.To SNF this week   ZIMMERMAN,DONIELLE MPA-C 10/22/2012,7:54 AM  I have seen and examined the patient and agree with the assessment and plan as outlined.  Mrs Hobart has decided to go home with her daughter rather than to SNF.  Will need to arrange for home health nursing with O2 and physical therapy.  Resume home regimen for diabetes at discharge except for lower dose of levemir insulin.  Continue lasix 40 mg daily.   OWEN,CLARENCE H 10/22/2012 8:20 AM

## 2012-10-22 NOTE — Progress Notes (Signed)
Occupational Therapy Treatment Patient Details Name: Rachel Santos MRN: [id number] DOB: 1940/07/17 Today's Date: 10/22/2012 Time: 4098-1191 OT Time Calculation (min): 18 min  OT Assessment / Plan / Recommendation  History of present illness s/p MVR, Maze , tricuspid repair       OT comments  Pt tolerated session well but did complain of headache pain. Informed nursing. Daughter present and is planning to take pt home with her. Discussed d/c needs including DME and  HHOT.   Follow Up Recommendations  Supervision/Assistance - 24 hour;Home health OT (per daughter planning home with her not SNF)    Barriers to Discharge       Equipment Recommendations  3 in 1 bedside comode    Recommendations for Other Services    Frequency Min 2X/week   Progress towards OT Goals Progress towards OT goals: Progressing toward goals  Plan Discharge plan needs to be updated    Precautions / Restrictions Precautions Precautions: Sternal Precaution Comments: pt educated for precautions  Restrictions Weight Bearing Restrictions: Yes   Pertinent Vitals/Pain Headache pain. Nursing aware.    ADL  Toilet Transfer: Performed;Min guard Statistician Method: Surveyor, minerals: Materials engineer and Hygiene: Simulated;Min guard Where Assessed - Engineer, mining and Hygiene: Sit to stand from 3-in-1 or toilet ADL Comments: Pt up to [facility] with daughter. Assisted pt from [facility] back to recliner. O2 sats briefly to upper 80s and encouraged PLB and back up to 90s on 1L. Pt able to verbalize sternal precautions. Daughter planning to take pt to her house and was asking about DME for home. She has a 3in1 to borrow but wanted a tubseat. Issued DMe handout and discussed advantages of tubbench versus seat. Pt and daughter to discuss and will let nursing/case manager know if they desire a seat to be ordered versus obtain on their own.     OT  Diagnosis:    OT Problem List:   OT Treatment Interventions:     OT Goals(current goals can now be found in the care plan section) Acute Rehab OT Goals Patient Stated Goal: return home Potential to Achieve Goals: Good  Visit Information  Last OT Received On: 10/22/12 Assistance Needed: +1 History of Present Illness: s/p MVR, Maze , tricuspid repair    Subjective Data      Prior Functioning       Cognition  Cognition Arousal/Alertness: Awake/alert Behavior During Therapy: WFL for tasks assessed/performed Overall Cognitive Status: Within Functional Limits for tasks assessed    Mobility  Transfers Transfers: Sit to Stand;Stand to Sit Sit to Stand: 4: Min guard;Without upper extremity assist;From chair/3-in-1 Stand to Sit: 4: Min guard;Without upper extremity assist;To chair/3-in-1 Details for Transfer Assistance: min verbal cues for stand to sit.    Exercises      Balance Balance Balance Assessed: Yes Dynamic Standing Balance Dynamic Standing - Level of Assistance: 5: Stand by assistance   End of Session OT - End of Session Equipment Utilized During Treatment: Rolling walker Activity Tolerance: Patient tolerated treatment well Patient left: in chair;with call bell/phone within reach;with family/visitor present  GO     Lennox Laity [phone] 10/22/2012, 12:48 PM

## 2012-10-22 NOTE — Progress Notes (Signed)
Physical Therapy Treatment Patient Details Name: Rachel Santos MRN: [id number] DOB: [birth year] Today's Date: 10/22/2012 Time: 4098-1191 PT Time Calculation (min): 12 min  PT Assessment / Plan / Recommendation  History of Present Illness s/p MVR, Maze , tricuspid repair   Clinical Impression Pt doing well with mobility.  Can go home with daughter.   PT Comments     Follow Up Recommendations  Home health PT;Supervision - Intermittent     Does the patient have the potential to tolerate intense rehabilitation     Barriers to Discharge        Equipment Recommendations  Rolling walker with 5" wheels    Recommendations for Other Services    Frequency Min 3X/week   Progress towards PT Goals Progress towards PT goals: Progressing toward goals  Plan Discharge plan needs to be updated    Precautions / Restrictions Precautions Precautions: Sternal;Fall Precaution Comments: pt educated for precautions  Restrictions Weight Bearing Restrictions: Yes   Pertinent Vitals/Pain SaO2 88% on RA at rest.    Mobility  Transfers Sit to Stand: 5: Supervision;Without upper extremity assist;From chair/3-in-1 Stand to Sit: 5: Supervision;Without upper extremity assist;To chair/3-in-1 Details for Transfer Assistance: Able to perform without cues. Ambulation/Gait Ambulation/Gait Assistance: 5: Supervision Ambulation Distance (Feet): 350 Feet Assistive device: Rolling walker;None Ambulation/Gait Assistance Details: Amb in room without walker and in hallway with walker. 1 standing rest break. Gait Pattern: Step-through pattern;Decreased stride length Gait velocity: decreased    Exercises     PT Diagnosis:    PT Problem List:   PT Treatment Interventions:     PT Goals (current goals can now be found in the care plan section) Acute Rehab PT Goals Patient Stated Goal: return home  Visit Information  Last PT Received On: 10/22/12 Assistance Needed: +1 History of Present Illness: s/p  MVR, Maze , tricuspid repair    Subjective Data  Patient Stated Goal: return home   Cognition  Cognition Arousal/Alertness: Awake/alert Behavior During Therapy: WFL for tasks assessed/performed Overall Cognitive Status: Within Functional Limits for tasks assessed    Balance  Balance Balance Assessed: Yes Dynamic Standing Balance Dynamic Standing - Level of Assistance: 5: Stand by assistance  End of Session PT - End of Session Activity Tolerance: Patient tolerated treatment well Patient left: in chair;with call bell/phone within reach;with family/visitor present   GP     [facility] 10/22/2012, 4:11 PM  [employer] PT [phone]

## 2012-10-23 LAB — PROTIME-INR
INR: 1.82 — ABNORMAL HIGH (ref 0.00–1.49)
Prothrombin Time: 20.5 seconds — ABNORMAL HIGH (ref 11.6–15.2)

## 2012-10-23 LAB — GLUCOSE, CAPILLARY
Glucose-Capillary: 102 mg/dL — ABNORMAL HIGH (ref 70–99)
Glucose-Capillary: 194 mg/dL — ABNORMAL HIGH (ref 70–99)

## 2012-10-23 NOTE — Progress Notes (Signed)
      [address]       [place] [address]             [phone]        12 Days Post-Op Procedure(s) (LRB): TRICUSPID VALVE REPAIR (N/A) MAZE (N/A) INTRAOPERATIVE TRANSESOPHAGEAL ECHOCARDIOGRAM (N/A) MITRAL VALVE REPAIR (MVR) (N/A)  Subjective: Patient feeling better and would like to go home with daughter  Objective: Vital signs in last 24 hours: Temp:  [97.5 F (36.4 C)-99.1 F (37.3 C)] 99.1 F (37.3 C) (07/29 0440) Pulse Rate:  [49-57] 57 (07/29 0440) Cardiac Rhythm:  [-] Atrial fibrillation (07/28 2030) Resp:  [16-18] 18 (07/29 0440) BP: (132-143)/(33-64) 137/64 mmHg (07/29 0440) SpO2:  [87 %-98 %] 96 % (07/29 0440) Weight:  [75.4 kg (166 lb 3.6 oz)] 75.4 kg (166 lb 3.6 oz) (07/29 0500)  Pre op weight  78 kg Current Weight  10/23/12 75.4 kg (166 lb 3.6 oz)      Intake/Output from previous day: 07/28 0701 - 07/29 0700 In: 496 [P.O.:490; I.V.:6] Out: 300 [Urine:300]   Physical Exam:  Cardiovascular: IRRR IRRR Pulmonary: Clear to auscultation bilaterally; no rales, wheezes, or rhonchi. Abdomen: Soft, non tender, bowel sounds present. Extremities: Mild bilateral lower extremity edema. Wounds: Clean and dry.  No erythema or signs of infection.  Lab Results: CBC:No results found for this basename: WBC, HGB, HCT, PLT,  in the last 72 hours BMET: No results found for this basename: NA, K, CL, CO2, GLUCOSE, BUN, CREATININE, CALCIUM,  in the last 72 hours  PT/INR:  Lab Results  Component Value Date   INR 1.82* 10/23/2012   INR 1.80* 10/22/2012   INR 1.78* 10/21/2012   ABG:  INR: Will add last result for INR, ABG once components are confirmed Will add last 4 CBG results once components are confirmed  Assessment/Plan:  1. CV - SB, slow AF. On Amiodarone 100 daily, Cozaar 100 daily, Norvasc 10 daily, and Coumadin. 2.  Pulmonary - Encourage incentive spirometer. On 1 liter of oxygen via [place]. 3. Volume Overload - On Lasix 40 daily 4.   Acute blood loss anemia - Last H and H 9 and 28.1 5.DM-CBGs 100/160/102. On Glipizide 10 daily, Metformin 1000 bid, and Levemir. Pre op HGA1C 6.3. Will resume half home insulin regimen at discharge 6.Will arrange HH, home PT, and oxygen 7.Discharge today   Ledon Weihe MPA-C 10/23/2012,7:32 AM

## 2012-10-23 NOTE — Progress Notes (Signed)
Occupational Therapy Treatment Patient Details Name: Rachel Santos MRN: [id number] DOB: 06/22/1940 Today's Date: 10/23/2012 Time: 4098-1191 OT Time Calculation (min): 38 min  OT Assessment / Plan / Recommendation  History of present illness s/p MVR, Maze , tricuspid repair   Clinical Impression Making excellent progress. Ready for d/C home with 24/7 S and HHOT. O2 SATS above 95 RA with activity. Dyspnea 2/4. Educated pt/daughter on precautions, compensatory techniques for ADL, E conservation and activity progression, including continued use of incentive spirometer. Written information given.    OT comments  Will benefit from continued OT [facility] services. Pt goal is to return to independent living .  Follow Up Recommendations  Home health OT    Barriers to Discharge       Equipment Recommendations  None recommended by OT (family has all equipment)    Recommendations for Other Services    Frequency Min 2X/week   Progress towards OT Goals Progress towards OT goals: Progressing toward goals  Plan Discharge plan needs to be updated    Precautions / Restrictions Precautions Precautions: Sternal;Fall Precaution Comments: pt educated for precautions  Restrictions Weight Bearing Restrictions: No   Pertinent Vitals/Pain BP 152/46. O2 SATs 97 - 95 with activity    ADL  Lower Body Bathing: Set up;Supervision/safety Lower Body Dressing: Supervision/safety;Set up Toilet Transfer: Supervision/safety Toilet Transfer Method: Sit to Barista: Bedside commode Equipment Used: Insurance account manager Transfers/Ambulation Related to ADLs: S. daughter demonstrated understanding ADL Comments: given written information of precautions, progression of activity    OT Diagnosis:    OT Problem List:   OT Treatment Interventions:     OT Goals(current goals can now be found in the care plan section) Acute Rehab OT Goals Patient Stated Goal: return home OT Goal  Formulation: With patient Time For Goal Achievement: 11/02/12 Potential to Achieve Goals: Good ADL Goals Pt Will Transfer to Toilet: with supervision;bedside commode Pt Will Perform Toileting - Clothing Manipulation and hygiene: with supervision;with caregiver independent in assisting;sit to/from stand Additional ADL Goal #1: demonstrate sternotomy precuations during ADL tasks without vc  Visit Information  Last OT Received On: 10/23/12 Assistance Needed: +1 History of Present Illness: s/p MVR, Maze , tricuspid repair    Subjective Data      Prior Functioning       Cognition       Mobility  Transfers Transfers: Sit to Stand;Stand to Sit Sit to Stand: 5: Supervision Stand to Sit: 5: Supervision    Exercises  Other Exercises Other Exercises: activity progression    Balance     End of Session OT - End of Session Equipment Utilized During Treatment: Gait belt;Rolling walker Activity Tolerance: Patient tolerated treatment well Patient left: in chair;with call bell/phone within reach;with family/visitor present;with nursing/sitter in room Nurse Communication: Mobility status;Other (comment) (O2 sats)  GO     Adriann Ballweg,HILLARY 10/23/2012, 10:48 AM Luisa Dago, OTR/L  [phone] 10/23/2012

## 2012-10-23 NOTE — Progress Notes (Signed)
[id number] Briefly reviewed post ed with patient and daughter as they did not remember if ed done or not. Once I started ed they remembered so I did a review with them. Education had been done Saturday.  Understanding voiced. They had watched video and pt ready to go. Kiyara Bouffard DunlapRNBSN

## 2012-10-23 NOTE — Progress Notes (Signed)
Patient and family decided to do Home Health. Passed on to Case Manager. No further social work needs identified at this time. CSW will sign off. Please re-consult if additional CSW needs arise.    Maree Krabbe, MSW, Theresia Majors [phone]

## 2012-10-23 NOTE — Progress Notes (Signed)
Pt/family given discharge instructions, medication lists, follow up appointments, and when to call the doctor.  Pt/family verbalizes understanding. Pt given signs and symptoms of infection. Rachel Santos    

## 2012-10-24 ENCOUNTER — Telehealth: Payer: Self-pay | Admitting: Cardiovascular Disease

## 2012-10-24 NOTE — Telephone Encounter (Signed)
Rachel Santos with [facility] has question regarding obtaining this patient INR and how you want to get it done.

## 2012-10-24 NOTE — Telephone Encounter (Signed)
Spoke with [facility] RN. She is going to see pt tomorrow and check INR and is asking if this needs to be venous blood draw or can it be done by finger stick.  I told her finger stick is done by coumadin clinic in our office and this should be OK.  She will call our office tomorrow with results.

## 2012-10-25 ENCOUNTER — Ambulatory Visit (INDEPENDENT_AMBULATORY_CARE_PROVIDER_SITE_OTHER): Payer: Medicare Other | Admitting: Cardiovascular Disease

## 2012-10-25 DIAGNOSIS — Z8679 Personal history of other diseases of the circulatory system: Secondary | ICD-10-CM

## 2012-10-25 DIAGNOSIS — Z9889 Other specified postprocedural states: Secondary | ICD-10-CM

## 2012-10-25 DIAGNOSIS — I4891 Unspecified atrial fibrillation: Secondary | ICD-10-CM

## 2012-10-25 DIAGNOSIS — Z7901 Long term (current) use of anticoagulants: Secondary | ICD-10-CM | POA: Insufficient documentation

## 2012-10-25 LAB — POCT INR: INR: 2.2

## 2012-10-25 NOTE — Telephone Encounter (Signed)
It appears that she is on coumadin because of her atrial fibrillation. Goal INR should be 2.0-3.0. Thanks, chris

## 2012-10-25 NOTE — Telephone Encounter (Signed)
Coumadin clinic needs to know range for INR.

## 2012-10-25 NOTE — Patient Instructions (Addendum)

## 2012-10-29 ENCOUNTER — Telehealth: Payer: Self-pay | Admitting: Cardiology

## 2012-10-29 NOTE — Telephone Encounter (Signed)
Can we have her stop Lasix and call us if symptoms do not improve over next 48 hours? Thanks, chris

## 2012-10-29 NOTE — Telephone Encounter (Signed)
Rachel Santos from [facility] called regarding pt today  C/O of Dizziness and almost passed out  when doing light exercise for about half a  one minute. Pt  Exercise fine when sitting. Rachel Santos thinks pt is too dry. Pt has lost about 12 lbs since D/C from the hospital which is about one lb a day. Pt is taking  Lasix 40 mg once a day amiodarone 100 mg  One tablet daily, amlodipine 10 mg once a day and  losartan 50 mg once day.  BP today 122/50 sitting. Last Friday  Pt's BP  Sitting was  138/42 standing 130 /20.

## 2012-10-29 NOTE — Telephone Encounter (Signed)
Susanne from [facility] is aware of Dr. Clifton James RECOMMENDATIONS, TO STOP THE LASIX AND TO CALL BACK IF SYMPTOMS DOES NOT IMPROVE OVER NEXT 48 HOURS . Lynnae Sandhoff verbalized understanding.

## 2012-10-29 NOTE — Telephone Encounter (Signed)
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

## 2012-10-29 NOTE — Telephone Encounter (Signed)
New Prob  Per Rosalita Chessman, pt almost passed out after doing some exercising.  She wants to speak with a nurse regarding it.

## 2012-11-01 ENCOUNTER — Ambulatory Visit (INDEPENDENT_AMBULATORY_CARE_PROVIDER_SITE_OTHER): Payer: Medicare Other | Admitting: Cardiovascular Disease

## 2012-11-01 DIAGNOSIS — Z8679 Personal history of other diseases of the circulatory system: Secondary | ICD-10-CM

## 2012-11-01 DIAGNOSIS — Z9889 Other specified postprocedural states: Secondary | ICD-10-CM

## 2012-11-01 DIAGNOSIS — I4891 Unspecified atrial fibrillation: Secondary | ICD-10-CM

## 2012-11-01 DIAGNOSIS — Z7901 Long term (current) use of anticoagulants: Secondary | ICD-10-CM

## 2012-11-05 ENCOUNTER — Telehealth: Payer: Self-pay | Admitting: Cardiovascular Disease

## 2012-11-05 DIAGNOSIS — I4891 Unspecified atrial fibrillation: Secondary | ICD-10-CM

## 2012-11-05 NOTE — Telephone Encounter (Signed)
New Problem   verbal order to continue home care pt// Report low BP this AM fiorst reading 88/40, 102/48 on right arm.

## 2012-11-05 NOTE — Telephone Encounter (Signed)
Spoke with Darl Pikes, Physical therapist, who saw pt this AM. Pt reports feeling a little "off today". Blood pressures today as listed below. Some dizziness.  Heart rate in mid 90's. Lasix was stopped on August 4,2014 due to low BP and wt loss. Vitals on 8/6 were 112/67-96, on 8/7 were 140/66-90. Pt has already taken AM meds today. RN to see pt tomorrow and PT will see pt on the 13th.  Pt has appt with Dr. Clifton James on November 09, 2012.  Physical therapist has already advised pt to rest today and make sure she remains hydrated. Physical therapist is also requesting order to continue visits as she does not think pt is ready to be discharged from PT yet.  I told her OK to continue. Will send note to Dr. Clifton James to see if any medicine changes need to be made at this time.

## 2012-11-05 NOTE — Telephone Encounter (Signed)
No changes today. Agree with your suggestions. cdm

## 2012-11-06 ENCOUNTER — Other Ambulatory Visit: Payer: Self-pay | Admitting: *Deleted

## 2012-11-06 DIAGNOSIS — G8918 Other acute postprocedural pain: Secondary | ICD-10-CM

## 2012-11-06 MED ORDER — HYDROCODONE-ACETAMINOPHEN 5-325 MG PO TABS: 1.0000 | ORAL_TABLET | ORAL | Status: DC | PRN

## 2012-11-08 ENCOUNTER — Other Ambulatory Visit: Payer: Self-pay | Admitting: *Deleted

## 2012-11-08 ENCOUNTER — Ambulatory Visit (INDEPENDENT_AMBULATORY_CARE_PROVIDER_SITE_OTHER): Payer: Medicare Other | Admitting: Cardiology

## 2012-11-08 DIAGNOSIS — Z8679 Personal history of other diseases of the circulatory system: Secondary | ICD-10-CM

## 2012-11-08 DIAGNOSIS — Z7901 Long term (current) use of anticoagulants: Secondary | ICD-10-CM

## 2012-11-08 DIAGNOSIS — I059 Rheumatic mitral valve disease, unspecified: Secondary | ICD-10-CM

## 2012-11-08 DIAGNOSIS — Z9889 Other specified postprocedural states: Secondary | ICD-10-CM

## 2012-11-08 DIAGNOSIS — I4891 Unspecified atrial fibrillation: Secondary | ICD-10-CM

## 2012-11-09 ENCOUNTER — Ambulatory Visit (INDEPENDENT_AMBULATORY_CARE_PROVIDER_SITE_OTHER): Payer: Medicare Other | Admitting: Cardiovascular Disease

## 2012-11-09 ENCOUNTER — Encounter: Payer: Self-pay | Admitting: Cardiovascular Disease

## 2012-11-09 VITALS — BP 107/69 | HR 85 | Ht 66.5 in | Wt 161.8 lb

## 2012-11-09 DIAGNOSIS — I4891 Unspecified atrial fibrillation: Secondary | ICD-10-CM

## 2012-11-09 DIAGNOSIS — I340 Nonrheumatic mitral (valve) insufficiency: Secondary | ICD-10-CM

## 2012-11-09 DIAGNOSIS — I059 Rheumatic mitral valve disease, unspecified: Secondary | ICD-10-CM

## 2012-11-09 DIAGNOSIS — I2510 Atherosclerotic heart disease of native coronary artery without angina pectoris: Secondary | ICD-10-CM

## 2012-11-09 MED ORDER — LOVASTATIN 40 MG PO TABS: 40.0000 mg | ORAL_TABLET | Freq: Every day | ORAL | Status: DC

## 2012-11-09 NOTE — Progress Notes (Signed)
History of Present Illness: 72 yo female with history of DM, HTN, OA, atrial fibrillation, GERD, cataracts, who is here today for cardiac follow up. I saw her as a new patient for evaluation of atrial fibrillation and severe MR on 07/03/12. She was diagnosed with atrial fibrillation on 06/27/12 in primary care and was started on Metoprolol 50 mg po BID for rate control and Xarelto 20 mg po Qdaily. Echo with normal LV size and function severe MR, moderate TR. We planned on 3 more weeks of anti-coagulation before consideration for a cardioversion. She had a TEE guided cardioversion on Jul 31, 2012 with return to sinus rhythm. Her MR was noted to be central and severe. The LV cavity was not dilated. LV systolic function was normal. There was moderate to severe TR as well. She converted to NSR. She was felt to be symptomatic from her mitral valve disease. Cardiac cath on 09/05/12 with mild CAD. She underwent placement of mitral valve ring and tricupsid valve ring as well as MAZE procedure per Dr. Cornelius Moras on 10/11/12. Post-operative course complicated by recurrence of atrial fibrillation with bradycardia. She was seen by Dr. Graciela Husbands as an EP consult and pacemaker not recommended. She was discharged home on amiodarone and coumadin. She was discharged home on Lasix for mild volume overload but this was stopped after discharge via phone messages due to dizziness and hypotension.   She is here today for follow up. She is feeling well. She had some dizziness but resolved after Lasix stopped. No chest pain or SOB. BP is stable at home. Overall doing well.   Primary Care Physician: Dr. Nicholos Johns [facility])   Past Medical History  Diagnosis Date  . Diabetes mellitus   . Arthritis   . Hypertension   . Atrial fibrillation   . Mitral regurgitation   . GERD (gastroesophageal reflux disease)   . Cataracts, bilateral   . PONV (postoperative nausea and vomiting)   . Tricuspid regurgitation   .  Shortness of breath     with activity  . Stroke     mini stroke with vision problems  . 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 S/P Maze operation for atrial fibrillation 10/11/2012    Complete bilateral atrial lesion set using bipolar radiofrequency and cryothermy ablation with clipping of LA appendage    Past Surgical History  Procedure Laterality Date  . Mastectomy Left 1982  . Cesarean section      x 2  . Tee without cardioversion N/A 07/31/2012    Procedure: TRANSESOPHAGEAL ECHOCARDIOGRAM (TEE);  Surgeon: Wendall Stade, MD;  Location: [facility] ENDOSCOPY;  Service: Cardiovascular;  Laterality: N/A;  . Cardioversion N/A 07/31/2012    Procedure: CARDIOVERSION;  Surgeon: Wendall Stade, MD;  Location: [facility] ENDOSCOPY;  Service: Cardiovascular;  Laterality: N/A;  . Tubal ligation    . Cardiac catheterization  09-05-12  . Tricuspid valve replacement N/A 10/11/2012    Procedure: TRICUSPID VALVE REPAIR;  Surgeon: Purcell Nails, MD;  Location: [facility] OR;  Service: Open Heart Surgery;  Laterality: N/A;  . Maze N/A 10/11/2012    Procedure: MAZE;  Surgeon: Purcell Nails, MD;  Location: [facility] OR;  Service: Open Heart Surgery;  Laterality: N/A;  . Intraoperative transesophageal echocardiogram N/A 10/11/2012    Procedure: INTRAOPERATIVE TRANSESOPHAGEAL ECHOCARDIOGRAM;  Surgeon: Purcell Nails, MD;  Location: [facility] OR;  Service: Open Heart Surgery;  Laterality: N/A;  . Mitral valve repair N/A 10/11/2012    Procedure: MITRAL VALVE REPAIR (MVR);  Surgeon: Purcell Nails, MD;  Location: [facility] OR;  Service: Open Heart Surgery;  Laterality: N/A;    Current Outpatient Prescriptions  Medication Sig Dispense Refill  . acetaminophen (TYLENOL) 500 MG tablet Take 1,000 mg by mouth every 6 (six) hours as needed for pain.      Marland Kitchen amiodarone (PACERONE) 100 MG tablet  Take 1 tablet (100 mg total) by mouth daily.  30 tablet  0  . amLODipine (NORVASC) 10 MG tablet Take 1 tablet (10 mg total) by mouth daily.  30 tablet  1  . aspirin EC 81 MG EC tablet Take 1 tablet (81 mg total) by mouth daily.      . calcium carbonate 200 MG capsule Calcium citrate  /vitamin D 630mg  (calicum)  1 tab daily      . glipiZIDE (GLUCOTROL XL) 10 MG 24 hr tablet Take 10 mg by mouth daily.       Marland Kitchen HYDROcodone-acetaminophen (NORCO/VICODIN) 5-325 MG per tablet Take 1-2 tablets by mouth every 4 (four) hours as needed for pain.  30 tablet  0  . insulin lispro (HUMALOG KWIKPEN) 100 UNIT/ML SOPN Inject 12-15 Units into the skin daily. After dinner.      . Lancets 28G MISC Lancets and test strips      . LEVEMIR FLEXPEN 100 UNIT/ML injection Inject 20-40 Units into the skin 2 (two) times daily. 40 units AM, 20 units pm      . losartan (COZAAR) 100 MG tablet Take 1 tablet (100 mg total) by mouth daily.  30 tablet  1  . metFORMIN (GLUCOPHAGE) 500 MG tablet Take 1,000 mg by mouth 2 (two) times daily with a meal.       . Multiple Vitamin (MULTIVITAMIN) tablet Take 1 tablet by mouth daily.      Marland Kitchen omeprazole (PRILOSEC) 40 MG capsule Take 40 mg by mouth daily.       Marland Kitchen PARoxetine (PAXIL) 30 MG tablet Take 30 mg by mouth daily.       . potassium chloride SA (K-DUR,KLOR-CON) 20 MEQ tablet Take 1 tablet (20 mEq total) by mouth daily.  30 tablet  0  . warfarin (COUMADIN) 3 MG tablet Take 1 tablet (3 mg total) by mouth daily at 6 PM. Or as directed.  30 tablet  1   No current facility-administered medications for this visit.    Allergies  Allergen Reactions  . Ultram [Tramadol] Nausea And Vomiting    History   Social History  . Marital Status: Widowed    Spouse Name: N/A    Number of Children: 3  . Years of Education: N/A   Occupational History  . Retired-[employer]    Social History Main Topics  . Smoking status: Former Smoker    Types: Cigarettes    Quit date:  09/25/2006  . Smokeless tobacco: Never Used  . Alcohol Use: No  . Drug Use: No  . Sexual Activity: No   Other Topics Concern  . Not on file   Social History Narrative  . No narrative on file    Family History  Problem Relation Age of Onset  . Cancer Father   . Heart attack Paternal Grandfather     Review of Systems:  As stated in the HPI and otherwise negative.   BP 107/69  Pulse 85  Ht 5' 6.5" (1.689 m)  Wt 161 lb 12.8 oz (73.392 kg)  BMI 25.73 kg/m2  Physical Examination: General: Well developed, well nourished, NAD HEENT: OP clear, mucus membranes moist SKIN: warm, dry. No rashes. Neuro: No focal deficits Musculoskeletal: Muscle strength 5/5 all ext Psychiatric: Mood and affect normal Neck: No JVD, no carotid bruits, no thyromegaly, no lymphadenopathy. Lungs:Clear bilaterally, no wheezes, rhonci, crackles Cardiovascular: Regular rate and rhythm. No murmurs, gallops or rubs. Abdomen:Soft. Bowel sounds present. Non-tender.  Extremities: No lower extremity edema. Pulses are 2 + in the bilateral DP/PT.  EKG: Sinus, rate 99 bpm. Possible old septal infarct.   Assessment and Plan:   1. Atrial fibrillation: Sinus today. Continue amiodarone for now. May change back to metoprolol at next visit and stop amiodarone. Continue coumadin therapy.   2. Mitral regurgitation: s/p mitral valve ring July 2014. Will repeat echo to assess.   3. Tricuspid regurgitation: s/p ring July 2014. Echo as above.   4. CAD: Mild by cath 2014. Continue ASA and statin.

## 2012-11-09 NOTE — Patient Instructions (Addendum)
Your physician recommends that you schedule a follow-up appointment in:  3 months.   Your physician has requested that you have an echocardiogram. Echocardiography is a painless test that uses sound waves to create images of your heart. It provides your doctor with information about the size and shape of your heart and how well your heart's chambers and valves are working. This procedure takes approximately one hour. There are no restrictions for this procedure. To be done in next 1-2 months.    Resume Lovastatin 40 mg by mouth daily at bedtime.

## 2012-11-12 ENCOUNTER — Ambulatory Visit: Payer: Self-pay | Admitting: Thoracic Surgery (Cardiothoracic Vascular Surgery)

## 2012-11-12 ENCOUNTER — Other Ambulatory Visit: Payer: Self-pay | Admitting: *Deleted

## 2012-11-12 ENCOUNTER — Ambulatory Visit
Admission: RE | Admit: 2012-11-12 | Discharge: 2012-11-12 | Disposition: A | Discharge status: Home / Self Care | Payer: Medicare Other | Source: Ambulatory Visit | Attending: Thoracic Surgery (Cardiothoracic Vascular Surgery) | Admitting: Thoracic Surgery (Cardiothoracic Vascular Surgery)

## 2012-11-12 ENCOUNTER — Ambulatory Visit (INDEPENDENT_AMBULATORY_CARE_PROVIDER_SITE_OTHER): Payer: Self-pay | Admitting: Physician Assistant

## 2012-11-12 VITALS — BP 100/57 | HR 95 | Resp 16 | Ht 66.5 in | Wt 163.0 lb

## 2012-11-12 DIAGNOSIS — I071 Rheumatic tricuspid insufficiency: Secondary | ICD-10-CM

## 2012-11-12 DIAGNOSIS — I059 Rheumatic mitral valve disease, unspecified: Secondary | ICD-10-CM

## 2012-11-12 DIAGNOSIS — Z9889 Other specified postprocedural states: Secondary | ICD-10-CM

## 2012-11-12 DIAGNOSIS — I079 Rheumatic tricuspid valve disease, unspecified: Secondary | ICD-10-CM

## 2012-11-12 DIAGNOSIS — Z8679 Personal history of other diseases of the circulatory system: Secondary | ICD-10-CM

## 2012-11-12 DIAGNOSIS — I340 Nonrheumatic mitral (valve) insufficiency: Secondary | ICD-10-CM

## 2012-11-12 DIAGNOSIS — I4891 Unspecified atrial fibrillation: Secondary | ICD-10-CM

## 2012-11-12 NOTE — Patient Instructions (Signed)
Vitamin K and Warfarin  Warfarin is a drug that helps thin your blood. If you take warfarin, you will need to follow a diet that has a consistent amount of vitamin K-containing foods. Sudden changes in the amount of vitamin K that you eat can cause the medicine to not work as well as it should. You do not need to avoid vitamin K-containing foods.  FOODS HIGH IN VITAMIN K  · Broccoli, fresh or frozen, cooked, 1 cup  · Greens, fresh or frozen, cooked (beet, collard, mustard, turnip), ½ cup  · Kale, fresh or frozen, cooked, ½ cup  · Parsley, raw, 10 sprigs  · Spinach, frozen or canned, cooked, ½ cup  FOODS MODERATELY HIGH IN VITAMIN K  · Bok choy, cooked, 1 cup  · Broccoli, raw, 1 cup  · Brussels sprouts, fresh or frozen, cooked, ½ cup  · Cabbage, cooked, 1 cup  · Endive, raw, 1 cup  · Green leaf lettuce, raw, 1 cup  · Green scallions, raw, ¼ cup  · Okra, frozen, cooked, 1 cup  · Romaine lettuce, raw, 1 cup  · Sauerkraut, canned, 1 cup  · Spinach, raw, 1 cup  KEEPING YOUR INTAKE CONSISTENT  · Note the foods that are high in vitamin K listed above. How many times per week do you eat these foods?  · For example, you might eat cooked broccoli 1 time a week and a leafy green salad 3 times a week. In that case, you should have 1 high vitamin K food each week and 3 moderately high foods each week.  · Remember, you do not need to eat the same foods each week. You do need to keep your vitamin K intake levels the same. Notify your caregiver before changing your diet.  MORE TIPS  · Take your warfarin as instructed.  · It is okay to take a multivitamin that contains vitamin K. Just be sure to take it every day.  · Discuss any supplements or whole food supplements with your pharmacist.  Document Released: 01/09/2009 Document Revised: 09/13/2011 Document Reviewed: 01/09/2009  Exit[employer]® Patient Information ©2014 [employer].

## 2012-11-12 NOTE — Progress Notes (Signed)
HPI: Patient returns for routine postoperative follow-up having undergone Mitral Valve Repair, Tricuspid Valve Repair, and [facility] MAZE procedure on 10/11/2012.  The patient's early postoperative recovery while in the hospital was notable for rate controlled Atrial Fibrllation, Bradycardia, and Junctional Rhythm.  She was evaluated by EP who did not feel pacemaker would be required.  She was discharged on Coumadin for continued Atrial Fibrillation.  Since hospital discharge the patient reports that she is doing well.  The patient did have some episodes of dizziness which resolved after discontinuation of Lasix.  She denies dyspnea and chest pain.  She is ambulating without difficulty.    Current Outpatient Prescriptions  Medication Sig Dispense Refill  . amiodarone (PACERONE) 100 MG tablet Take 1 tablet (100 mg total) by mouth daily.  30 tablet  0  . acetaminophen (TYLENOL) 500 MG tablet Take 1,000 mg by mouth every 6 (six) hours as needed for pain.      Marland Kitchen amLODipine (NORVASC) 10 MG tablet Take 1 tablet (10 mg total) by mouth daily.  30 tablet  1  . aspirin EC 81 MG EC tablet Take 1 tablet (81 mg total) by mouth daily.      . calcium carbonate 200 MG capsule Calcium citrate  /vitamin D 630mg  (calicum)  1 tab daily      . glipiZIDE (GLUCOTROL XL) 10 MG 24 hr tablet Take 10 mg by mouth daily.       Marland Kitchen HYDROcodone-acetaminophen (NORCO/VICODIN) 5-325 MG per tablet Take 1-2 tablets by mouth every 4 (four) hours as needed for pain.  30 tablet  0  . insulin lispro (HUMALOG KWIKPEN) 100 UNIT/ML SOPN Inject 12-15 Units into the skin daily. After dinner.      . Lancets 28G MISC Lancets and test strips      . LEVEMIR FLEXPEN 100 UNIT/ML injection Inject 20-40 Units into the skin 2 (two) times daily. 40 units AM, 20 units pm      . losartan (COZAAR) 100 MG tablet Take 1 tablet (100 mg total) by mouth daily.  30 tablet  1  . lovastatin (MEVACOR) 40 MG tablet Take 1 tablet (40 mg total) by mouth at bedtime.  30  tablet  6  . metFORMIN (GLUCOPHAGE) 500 MG tablet Take 1,000 mg by mouth 2 (two) times daily with a meal.       . Multiple Vitamin (MULTIVITAMIN) tablet Take 1 tablet by mouth daily.      Marland Kitchen omeprazole (PRILOSEC) 40 MG capsule Take 40 mg by mouth daily.       Marland Kitchen PARoxetine (PAXIL) 30 MG tablet Take 30 mg by mouth daily.       . potassium chloride SA (K-DUR,KLOR-CON) 20 MEQ tablet Take 1 tablet (20 mEq total) by mouth daily.  30 tablet  0  . warfarin (COUMADIN) 3 MG tablet Take 1 tablet (3 mg total) by mouth daily at 6 PM. Or as directed.  30 tablet  1   No current facility-administered medications for this visit.    Physical Exam:  BP 100/57  Pulse 95  Resp 16  Ht 5' 6.5" (1.689 m)  Wt 163 lb (73.936 kg)  BMI 25.92 kg/m2  SpO2 97% Gen: no apparent distress Heart: RRR Lungs: CTA  Skin: Incisions healing well  Diagnostic Tests:  CXR: no pleural effusion, post operative changes stable  EKG: NSR  A/P:  1. S/P MVR, TVR, MAZE- patient is doing very well.  She has had no further episodes of Atrial Fibrillation.  Dr.  Mchalny is following the patient's Amiodarone and is hopeful to discontinue this medication soon.  She remains on Coumadin and her INR has been stable.  She is ambulating without difficulty and has been encouraged to continue to increase her activity level as tolerated.   She was instructed she may resume driving 2. RTC in 3 months with Dr. Cornelius Moras

## 2012-11-14 ENCOUNTER — Ambulatory Visit (INDEPENDENT_AMBULATORY_CARE_PROVIDER_SITE_OTHER): Payer: Medicare Other | Admitting: Internal Medicine

## 2012-11-14 DIAGNOSIS — I4891 Unspecified atrial fibrillation: Secondary | ICD-10-CM

## 2012-11-14 DIAGNOSIS — Z9889 Other specified postprocedural states: Secondary | ICD-10-CM

## 2012-11-14 DIAGNOSIS — Z8679 Personal history of other diseases of the circulatory system: Secondary | ICD-10-CM

## 2012-11-14 DIAGNOSIS — Z7901 Long term (current) use of anticoagulants: Secondary | ICD-10-CM

## 2012-11-14 LAB — POCT INR: INR: 1.4

## 2012-11-15 ENCOUNTER — Other Ambulatory Visit (HOSPITAL_COMMUNITY): Payer: Medicare Other

## 2012-11-23 ENCOUNTER — Ambulatory Visit (INDEPENDENT_AMBULATORY_CARE_PROVIDER_SITE_OTHER): Payer: Medicare Other

## 2012-11-23 DIAGNOSIS — Z7901 Long term (current) use of anticoagulants: Secondary | ICD-10-CM

## 2012-11-23 DIAGNOSIS — Z9889 Other specified postprocedural states: Secondary | ICD-10-CM

## 2012-11-23 DIAGNOSIS — I4891 Unspecified atrial fibrillation: Secondary | ICD-10-CM

## 2012-11-23 DIAGNOSIS — Z8679 Personal history of other diseases of the circulatory system: Secondary | ICD-10-CM

## 2012-11-23 LAB — POCT INR: INR: 1.2

## 2012-11-23 NOTE — Patient Instructions (Signed)
Pt is going to check with primary MD [facility] about having Coumadin managed there.  Will call us and let us know who is going to manage [phone].

## 2012-11-29 ENCOUNTER — Telehealth: Payer: Self-pay | Admitting: Cardiovascular Disease

## 2012-11-29 ENCOUNTER — Ambulatory Visit: Payer: Self-pay

## 2012-11-29 DIAGNOSIS — Z7901 Long term (current) use of anticoagulants: Secondary | ICD-10-CM

## 2012-11-29 DIAGNOSIS — Z9889 Other specified postprocedural states: Secondary | ICD-10-CM

## 2012-11-29 DIAGNOSIS — I4891 Unspecified atrial fibrillation: Secondary | ICD-10-CM

## 2012-11-29 DIAGNOSIS — Z8679 Personal history of other diseases of the circulatory system: Secondary | ICD-10-CM

## 2012-11-29 MED ORDER — AMIODARONE HCL 100 MG PO TABS: 100.0000 mg | ORAL_TABLET | Freq: Every day | ORAL | Status: DC

## 2012-11-29 NOTE — Telephone Encounter (Signed)
New problem    Please advise is she should continue / discontinue  amiodarone 200 mg since she has finished her dosage

## 2012-11-29 NOTE — Telephone Encounter (Signed)
Spoke with pt. She has been taking Amiodarone 100 mg daily. I told pt per last office note she should continue this medication and Dr. Clifton James would discuss possible discontinuation at next office visit in November. Refills sent to [facility]

## 2012-12-04 ENCOUNTER — Other Ambulatory Visit (HOSPITAL_COMMUNITY): Payer: Medicare Other

## 2012-12-04 ENCOUNTER — Ambulatory Visit (HOSPITAL_COMMUNITY): Payer: Medicare Other | Attending: Cardiovascular Disease

## 2012-12-04 DIAGNOSIS — I369 Nonrheumatic tricuspid valve disorder, unspecified: Secondary | ICD-10-CM

## 2012-12-04 DIAGNOSIS — I340 Nonrheumatic mitral (valve) insufficiency: Secondary | ICD-10-CM

## 2012-12-04 DIAGNOSIS — I379 Nonrheumatic pulmonary valve disorder, unspecified: Secondary | ICD-10-CM | POA: Insufficient documentation

## 2012-12-04 DIAGNOSIS — I059 Rheumatic mitral valve disease, unspecified: Secondary | ICD-10-CM | POA: Insufficient documentation

## 2012-12-04 DIAGNOSIS — Z9889 Other specified postprocedural states: Secondary | ICD-10-CM | POA: Insufficient documentation

## 2012-12-04 NOTE — Progress Notes (Signed)
Echocardiogram performed.  

## 2012-12-17 ENCOUNTER — Other Ambulatory Visit: Payer: Self-pay | Admitting: Physician Assistant

## 2012-12-21 ENCOUNTER — Other Ambulatory Visit: Payer: Self-pay

## 2012-12-21 MED ORDER — AMLODIPINE BESYLATE 10 MG PO TABS: 10.0000 mg | ORAL_TABLET | Freq: Every day | ORAL | Status: DC

## 2012-12-22 ENCOUNTER — Other Ambulatory Visit: Payer: Self-pay | Admitting: Physician Assistant

## 2012-12-24 ENCOUNTER — Telehealth: Payer: Self-pay | Admitting: Cardiovascular Disease

## 2012-12-24 MED ORDER — AMLODIPINE BESYLATE 10 MG PO TABS: 10.0000 mg | ORAL_TABLET | Freq: Every day | ORAL | Status: DC

## 2012-12-24 NOTE — Telephone Encounter (Signed)
Returned call to patient she stated she needed a refill for amlodipine.Refill sent to pharmacy.

## 2012-12-24 NOTE — Telephone Encounter (Signed)
New Problem:  Pt states she has been waiting on a new Rx to be sent to her pharmacy pt would like to know the status.

## 2013-02-11 ENCOUNTER — Ambulatory Visit: Payer: Medicare Other | Admitting: Thoracic Surgery (Cardiothoracic Vascular Surgery)

## 2013-02-19 ENCOUNTER — Encounter: Payer: Self-pay | Admitting: Cardiovascular Disease

## 2013-02-19 ENCOUNTER — Ambulatory Visit (INDEPENDENT_AMBULATORY_CARE_PROVIDER_SITE_OTHER): Payer: Medicare Other | Admitting: Cardiovascular Disease

## 2013-02-19 ENCOUNTER — Encounter (INDEPENDENT_AMBULATORY_CARE_PROVIDER_SITE_OTHER): Payer: Self-pay

## 2013-02-19 VITALS — BP 128/82 | HR 76 | Ht 66.5 in | Wt 159.0 lb

## 2013-02-19 DIAGNOSIS — Z7901 Long term (current) use of anticoagulants: Secondary | ICD-10-CM

## 2013-02-19 DIAGNOSIS — Z9889 Other specified postprocedural states: Secondary | ICD-10-CM

## 2013-02-19 DIAGNOSIS — I2510 Atherosclerotic heart disease of native coronary artery without angina pectoris: Secondary | ICD-10-CM

## 2013-02-19 DIAGNOSIS — I4891 Unspecified atrial fibrillation: Secondary | ICD-10-CM

## 2013-02-19 DIAGNOSIS — I079 Rheumatic tricuspid valve disease, unspecified: Secondary | ICD-10-CM

## 2013-02-19 DIAGNOSIS — I071 Rheumatic tricuspid insufficiency: Secondary | ICD-10-CM

## 2013-02-19 NOTE — Progress Notes (Signed)
History of Present Illness: 72 yo female with history of DM, HTN, OA, atrial fibrillation, GERD, cataracts, who is here today for cardiac follow up. I saw her as a new patient for evaluation of atrial fibrillation and severe MR on 07/03/12. She was diagnosed with atrial fibrillation on 06/27/12 in primary care and was started on Metoprolol 50 mg po BID for rate control and Xarelto 20 mg po Qdaily. Echo with normal LV size and function severe MR, moderate TR. She had a TEE guided cardioversion on Jul 31, 2012 with return to sinus rhythm. Her MR was noted to be central and severe. The LV cavity was not dilated. LV systolic function was normal. There was moderate to severe TR as well. She converted to NSR. She was felt to be symptomatic from her mitral valve disease. Cardiac cath on 09/05/12 with mild CAD. She underwent placement of mitral valve ring and tricuspid valve ring as well as MAZE procedure per Dr. Cornelius Moras on 10/11/12. Post-operative course complicated by recurrence of atrial fibrillation with bradycardia. She was seen by Dr. Graciela Husbands as an EP consult and pacemaker not recommended. She was discharged home on amiodarone and coumadin. She was discharged home on Lasix for mild volume overload but this was stopped after discharge via phone messages due to dizziness and hypotension.  Echo 12/04/12 with normal LV size and function, MR and TV repair intact.   She is here today for follow up. She is feeling well. No chest pain or SOB. BP is stable at home. Overall doing well.   Primary Care Physician: Dr. Nicholos Johns [facility])   Past Medical History  Diagnosis Date  . Diabetes mellitus   . Arthritis   . Hypertension   . Atrial fibrillation   . Mitral regurgitation   . GERD (gastroesophageal reflux disease)   . Cataracts, bilateral   . PONV (postoperative nausea and vomiting)   . Tricuspid regurgitation   . Shortness of breath     with activity  . Stroke     mini stroke with vision  problems  . 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 S/P Maze operation for atrial fibrillation 10/11/2012    Complete bilateral atrial lesion set using bipolar radiofrequency and cryothermy ablation with clipping of LA appendage    Past Surgical History  Procedure Laterality Date  . Mastectomy Left 1982  . Cesarean section      x 2  . Tee without cardioversion N/A 07/31/2012    Procedure: TRANSESOPHAGEAL ECHOCARDIOGRAM (TEE);  Surgeon: Wendall Stade, MD;  Location: [facility] ENDOSCOPY;  Service: Cardiovascular;  Laterality: N/A;  . Cardioversion N/A 07/31/2012    Procedure: CARDIOVERSION;  Surgeon: Wendall Stade, MD;  Location: [facility] ENDOSCOPY;  Service: Cardiovascular;  Laterality: N/A;  . Tubal ligation    . Cardiac catheterization  09-05-12  . Tricuspid valve replacement N/A 10/11/2012    Procedure: TRICUSPID VALVE REPAIR;  Surgeon: Purcell Nails, MD;  Location: [facility] OR;  Service: Open Heart Surgery;  Laterality: N/A;  . Maze N/A 10/11/2012    Procedure: MAZE;  Surgeon: Purcell Nails, MD;  Location: [facility] OR;  Service: Open Heart Surgery;  Laterality: N/A;  . Intraoperative transesophageal echocardiogram N/A 10/11/2012    Procedure: INTRAOPERATIVE TRANSESOPHAGEAL ECHOCARDIOGRAM;  Surgeon: Purcell Nails, MD;  Location:  [facility] OR;  Service: Open Heart Surgery;  Laterality: N/A;  . Mitral valve repair N/A 10/11/2012    Procedure: MITRAL VALVE REPAIR (MVR);  Surgeon: Purcell Nails, MD;  Location: [facility] OR;  Service: Open Heart Surgery;  Laterality: N/A;    Current Outpatient Prescriptions  Medication Sig Dispense Refill  . acetaminophen (TYLENOL) 500 MG tablet Take 1,000 mg by mouth every 6 (six) hours as needed for pain.      Marland Kitchen amiodarone (PACERONE) 100 MG tablet Take 1 tablet (100 mg total) by mouth daily.  30 tablet  6  . amLODipine  (NORVASC) 10 MG tablet Take 1 tablet (10 mg total) by mouth daily.  30 tablet  6  . aspirin EC 81 MG EC tablet Take 1 tablet (81 mg total) by mouth daily.      . calcium carbonate 200 MG capsule Calcium citrate  /vitamin D 630mg  (calicum)  1 tab daily      . glipiZIDE (GLUCOTROL XL) 10 MG 24 hr tablet Take 10 mg by mouth daily.       . insulin lispro (HUMALOG KWIKPEN) 100 UNIT/ML SOPN Inject 12-15 Units into the skin daily. After dinner.      . Lancets 28G MISC Lancets and test strips      . LEVEMIR FLEXPEN 100 UNIT/ML injection Inject 20-40 Units into the skin 2 (two) times daily. 40 units AM, 20 units pm      . losartan (COZAAR) 100 MG tablet Take 1 tablet (100 mg total) by mouth daily.  30 tablet  1  . lovastatin (MEVACOR) 40 MG tablet Take 1 tablet (40 mg total) by mouth at bedtime.  30 tablet  6  . metFORMIN (GLUCOPHAGE) 500 MG tablet Take 1,000 mg by mouth 2 (two) times daily with a meal.       . Multiple Vitamin (MULTIVITAMIN) tablet Take 1 tablet by mouth daily.      Marland Kitchen omeprazole (PRILOSEC) 40 MG capsule Take 40 mg by mouth daily.       Marland Kitchen PARoxetine (PAXIL) 30 MG tablet Take 30 mg by mouth daily.       Marland Kitchen warfarin (COUMADIN) 3 MG tablet Take 1 tablet (3 mg total) by mouth daily at 6 PM. Or as directed.  30 tablet  1   No current facility-administered medications for this visit.    Allergies  Allergen Reactions  . Ultram [Tramadol] Nausea And Vomiting    History   Social History  . Marital Status: Widowed    Spouse Name: N/A    Number of Children: 3  . Years of Education: N/A   Occupational History  . Retired-[employer]    Social History Main Topics  . Smoking status: Former Smoker    Types: Cigarettes    Quit date: 09/25/2006  . Smokeless tobacco: Never Used  . Alcohol Use: No  . Drug Use: No  . Sexual Activity: No   Other Topics Concern  . Not on file   Social History Narrative  . No narrative on file    Family History  Problem Relation  Age of Onset  . Cancer Father   . Heart attack Paternal Grandfather     Review of Systems:  As stated in the HPI and otherwise negative.   BP 128/82  Pulse 76  Ht 5' 6.5" (1.689 m)  Wt 159 lb (72.122 kg)  BMI 25.28 kg/m2  Physical Examination: General: Well developed, well nourished, NAD HEENT: OP clear, mucus membranes moist  SKIN: warm, dry. No rashes. Neuro: No focal deficits Musculoskeletal: Muscle strength 5/5 all ext Psychiatric: Mood and affect normal Neck: No JVD, no carotid bruits, no thyromegaly, no lymphadenopathy. Lungs:Clear bilaterally, no wheezes, rhonci, crackles Cardiovascular: Regular rate and rhythm. No murmurs, gallops or rubs. Abdomen:Soft. Bowel sounds present. Non-tender.  Extremities: No lower extremity edema. Pulses are 2 + in the bilateral DP/PT.  EKG: NSR, rate 76 bpm.   Echo 12/04/12: Left ventricle: The cavity size was normal. Wall thickness was increased in a pattern of mild LVH. Systolic function was normal. The estimated ejection fraction was in the range of 55% to 60%. Wall motion was normal; there were no regional wall motion abnormalities. Left ventricular diastolic function parameters were normal. - Mitral valve: Prior procedures included surgical repair. - Left atrium: The atrium was moderately dilated. - Atrial septum: No defect or patent foramen ovale was identified. - Tricuspid valve: Prior procedures included surgical repair.  Assessment and Plan:   1. Atrial fibrillation: Sinus today. Continue amiodarone for now. Continue coumadin therapy. Will stop ASA as this increases her chance of bleeding while on coumadin and she has no coronary stents or recent ACS.   2. Mitral regurgitation: s/p mitral valve ring July 2014.   3. Tricuspid regurgitation: s/p tricuspid ring July 2014.  4. CAD: Mild by cath 2014. Continue ASA and statin.

## 2013-02-19 NOTE — Patient Instructions (Signed)
Your physician wants you to follow-up in:  6 months.  You will receive a reminder letter in the mail two months in advance. If you don't receive a letter, please call our office to schedule the follow-up appointment.  Your physician has recommended you make the following change in your medication: Stop aspirin   

## 2013-05-27 ENCOUNTER — Ambulatory Visit (INDEPENDENT_AMBULATORY_CARE_PROVIDER_SITE_OTHER): Payer: Medicare Other | Admitting: Thoracic Surgery (Cardiothoracic Vascular Surgery)

## 2013-05-27 ENCOUNTER — Encounter: Payer: Self-pay | Admitting: Thoracic Surgery (Cardiothoracic Vascular Surgery)

## 2013-05-27 VITALS — BP 114/60 | HR 70 | Resp 16 | Ht 66.5 in | Wt 159.0 lb

## 2013-05-27 DIAGNOSIS — I059 Rheumatic mitral valve disease, unspecified: Secondary | ICD-10-CM

## 2013-05-27 DIAGNOSIS — Z9889 Other specified postprocedural states: Secondary | ICD-10-CM

## 2013-05-27 DIAGNOSIS — Z8679 Personal history of other diseases of the circulatory system: Secondary | ICD-10-CM

## 2013-05-27 DIAGNOSIS — I079 Rheumatic tricuspid valve disease, unspecified: Secondary | ICD-10-CM

## 2013-05-27 DIAGNOSIS — I4891 Unspecified atrial fibrillation: Secondary | ICD-10-CM

## 2013-05-27 DIAGNOSIS — I340 Nonrheumatic mitral (valve) insufficiency: Secondary | ICD-10-CM

## 2013-05-27 DIAGNOSIS — I071 Rheumatic tricuspid insufficiency: Secondary | ICD-10-CM

## 2013-05-27 NOTE — Patient Instructions (Addendum)
Stop taking amiodarone and let the pharmacist at the coumadin clinic know that you have stopped amiodarone  Endocarditis is a potentially serious infection of heart valves or inside lining of the heart.  It occurs more commonly in patients with diseased heart valves (such as patient's with aortic or mitral valve disease) and in patients who have undergone heart valve repair or replacement.  Certain surgical and dental procedures may put you at risk, such as dental cleaning, other dental procedures, or any surgery involving the respiratory, urinary, gastrointestinal tract, gallbladder or prostate gland.   To minimize your chances for develooping endocarditis, maintain good oral health and seek prompt medical attention for any infections involving the mouth, teeth, gums, skin or urinary tract.  Always notify your doctor or dentist about your underlying heart valve condition before having any invasive procedures. You will need to take antibiotics before certain procedures.

## 2013-05-27 NOTE — Progress Notes (Signed)
[address]       [place] [address]             [phone]     CARDIOTHORACIC SURGERY OFFICE NOTE  Referring Provider is [name], MD PCP is Merrilee Seashore, MD   HPI:  Patient returns for routine followup status post mitral valve repair, tricuspid valve repair, and Maze procedure on 10/11/2012.  Her postoperative recovery was uneventful although she was discharged from the hospital in rate-controlled atrial fibrillation.  She was last seen here in our office on 11/12/2012. Since then she underwent followup echocardiogram revealing intact mitral and tricuspid valve repairs with normal LV systolic function.  She was later seen in followup by Dr. Angelena Form on 02/19/2013 at which time she was maintaining sinus rhythm.  Since then she has continued to do very well and she returns to our office for routine followup today. She states that she no longer get any shortness of breath unless she really does something strenuous such as trying to run.  She has no shortness of breath with normal daily activities. She has not had any chest discomfort. She denies any tachypalpitations or dizzy spells. Overall she feels quite well and her only complaint is that she has some trouble sleeping at night.   Current Outpatient Prescriptions  Medication Sig Dispense Refill  . amiodarone (PACERONE) 100 MG tablet Take 1 tablet (100 mg total) by mouth daily.  30 tablet  6  . amLODipine (NORVASC) 10 MG tablet Take 1 tablet (10 mg total) by mouth daily.  30 tablet  6  . calcium carbonate 200 MG capsule Calcium citrate  /vitamin D 630mg  (calicum)  1 tab daily      . glipiZIDE (GLUCOTROL XL) 10 MG 24 hr tablet Take 10 mg by mouth daily.       . insulin lispro (HUMALOG KWIKPEN) 100 UNIT/ML SOPN Inject 12-15 Units into the skin daily. After dinner.      . Lancets 28G MISC Lancets and test strips      . LEVEMIR FLEXPEN 100 UNIT/ML injection Inject 20-40 Units into the skin 2 (two)  times daily. 40 units AM, 20 units pm      . losartan (COZAAR) 100 MG tablet Take 1 tablet (100 mg total) by mouth daily.  30 tablet  1  . lovastatin (MEVACOR) 40 MG tablet Take 1 tablet (40 mg total) by mouth at bedtime.  30 tablet  6  . metFORMIN (GLUCOPHAGE) 500 MG tablet Take 1,000 mg by mouth 2 (two) times daily with a meal.       . Multiple Vitamin (MULTIVITAMIN) tablet Take 1 tablet by mouth daily.      Marland Kitchen omeprazole (PRILOSEC) 40 MG capsule Take 40 mg by mouth daily.       Marland Kitchen PARoxetine (PAXIL) 30 MG tablet Take 30 mg by mouth daily.       Marland Kitchen warfarin (COUMADIN) 3 MG tablet Take 1 tablet (3 mg total) by mouth daily at 6 PM. Or as directed.  30 tablet  1   No current facility-administered medications for this visit.      Physical Exam:   BP 114/60  Pulse 70  Resp 16  Ht 5' 6.5" (1.689 m)  Wt 159 lb (72.122 kg)  BMI 25.28 kg/m2  SpO2 97%  General:  Well-appearing  Chest:   Clear to auscultation  CV:   Regular rate and rhythm without murmur  Incisions:  Completely healed, sternum is  stable  Abdomen:  Soft and nontender  Extremities:  Warm and well-perfused with no lower extremity edema  Diagnostic Tests:  2 channel telemetry rhythm strip demonstrates normal sinus rhythm   Transthoracic Echocardiography  Patient: Rachel Santos, Rachel Santos MR #: [id number] Study Date: 12/04/2012 Gender: F Age: 73 Height: 170.2cm Weight: 73kg BSA: 1.90m^2 Pt. Status: Room:  PERFORMING [facility], [facility] SONOGRAPHER Philomena Course, RDCS ATTENDING Teresita Madura, Christopher REFERRING Verne Carrow cc:  ------------------------------------------------------------ LV EF: 55% - 60%  ------------------------------------------------------------ Indications: Mitral regurgitation, post repair 424.0. 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  ------------------------------------------------------------ History: PMH: Maze procedure. Breast cancer,  left mastectomy. Acquired from the patient and from the patient's chart. Atrial fibrillation. Coronary artery disease. Severe mitral regurgitation, post repair. Moderate tricuspid regurgitation, post repair. Transient ischemic attack. Risk factors: Former tobacco use. Hypertension. Diabetes mellitus.  ------------------------------------------------------------ Study Conclusions  - Left ventricle: The cavity size was normal. Wall thickness was increased in a pattern of mild LVH. Systolic function was normal. The estimated ejection fraction was in the range of 55% to 60%. Wall motion was normal; there were no regional wall motion abnormalities. Left ventricular diastolic function parameters were normal. - Mitral valve: Prior procedures included surgical repair. - Left atrium: The atrium was moderately dilated. - Atrial septum: No defect or patent foramen ovale was identified. - Tricuspid valve: Prior procedures included surgical repair.  ------------------------------------------------------------ Labs, prior tests, procedures, and surgery: Catheterization (September 05, 2012).  Transthoracic echocardiography. M-mode, complete 2D, spectral Doppler, and color Doppler. Height: Height: 170.2cm. Height: 67in. Weight: Weight: 73kg. Weight: 160.7lb. Body mass index: BMI: 25.2kg/m^2. Body surface area: BSA: 1.69m^2. Blood pressure: 107/69. Patient status: Outpatient. Location: [facility]  ------------------------------------------------------------  ------------------------------------------------------------ Left ventricle: The cavity size was normal. Wall thickness was increased in a pattern of mild LVH. Systolic function was normal. The estimated ejection fraction was in the range of 55% to 60%. Wall motion was normal; there were no regional wall motion abnormalities. The transmitral flow pattern was normal. The deceleration time of the early transmitral flow velocity was  normal. The pulmonary vein flow pattern was normal. The tissue Doppler parameters were normal. Left ventricular diastolic function parameters were normal.  ------------------------------------------------------------ Aortic valve: Trileaflet. Sclerosis without stenosis. Doppler: No regurgitation.  ------------------------------------------------------------ Aorta: The aorta was normal, not dilated, and non-diseased.  ------------------------------------------------------------ Mitral valve: Normal-sized annulus. Prior procedures included surgical repair. Doppler: Trivial regurgitation. Valve area by pressure half-time: 2.22cm^2. Indexed valve area by pressure half-time: 1.21cm^2/m^2. Valve area by continuity equation (using LVOT flow): 1.86cm^2. Indexed valve area by continuity equation (using LVOT flow): 1.01cm^2/m^2. Mean gradient: 3mm Hg (D). Peak gradient: 52mm Hg (D).  ------------------------------------------------------------ Left atrium: The atrium was moderately dilated.  ------------------------------------------------------------ Atrial septum: No defect or patent foramen ovale was identified.  ------------------------------------------------------------ Right ventricle: The cavity size was normal. Wall thickness was normal. Systolic function was normal.  ------------------------------------------------------------ Pulmonic valve: Structurally normal valve. Cusp separation was normal. Doppler: Transvalvular velocity was within the normal range. Trivial regurgitation.  ------------------------------------------------------------ Tricuspid valve: Normal-sized annulus. Prior procedures included surgical repair. Doppler: No significant regurgitation. Mean gradient: 78mm Hg (D). Peak gradient: 73mm Hg (D).  ------------------------------------------------------------ Pulmonary artery: The main pulmonary artery  was normal-sized.  ------------------------------------------------------------ Right atrium: The atrium was normal in size.  ------------------------------------------------------------ Pericardium: The pericardium was normal in appearance.  ------------------------------------------------------------ Systemic veins: Inferior vena cava: The vessel was normal in size; the respirophasic diameter changes were in the normal range (= 50%); findings are consistent with normal central venous  pressure.  ------------------------------------------------------------ Post procedure conclusions Ascending Aorta:  - The aorta was normal, not dilated, and non-diseased.  ------------------------------------------------------------  2D measurements Normal Doppler measurements Norma Left ventricle l LVID ED, 33.2 mm 43-52 Left ventricle chord, Ea, lat 15. cm/s ----- PLAX ann, tiss 1 LVID ES, 24.6 mm 23-38 DP chord, E/Ea, lat 10. ----- PLAX ann, tiss 99 FS, chord, 26 % >29 DP PLAX Ea, med 6.4 cm/s ----- LVPW, ED 12.8 mm ------ ann, tiss 3 IVS/LVPW 1.07 <1.3 DP ratio, ED E/Ea, med 25. ----- Ventricular septum ann, tiss 82 IVS, ED 13.7 mm ------ DP LVOT LVOT Diam, S 22 mm ------ Peak vel, 136 cm/s ----- Area 3.8 cm^2 ------ S Diam 22 mm ------ VTI, S 27. cm ----- Aorta 9 Root diam, 29 mm ------ Peak 7 mm Hg ----- ED gradient, Left atrium S AP dim 46 mm ------ HR 84 bpm ----- AP dim 2.5 cm/m^2 <2.2 Stroke 106 ml ----- index vol .1 Cardiac 8.9 L/min ----- output Cardiac 4.8 L/(min-m ----- index ^2) Stroke 57. ml/m^2 ----- index 6 Mitral valve Peak E 166 cm/s ----- vel Peak A 116 cm/s ----- vel Mean vel, 118 cm/s ----- D Decelerat 229 ms 150-2 ion time 30 Pressure 99 ms ----- half-time Mean 7 mm Hg ----- gradient, D Peak 14 mm Hg ----- gradient, D Peak E/A 1.4 ----- ratio Area 2.2 cm^2 ----- (PHT) 2 Area 1.2 cm^2/m^2 ----- index 1 (PHT) Area 1.8 cm^2  ----- (LVOT) 6 continuit y Area 1.0 cm^2/m^2 ----- index 1 (LVOT cont) Annulus 56. cm ----- VTI 9 Tricuspid valve Mean vel, 69. cm/s ----- D 8 Mean 2 mm Hg ----- gradient, D Peak 4 mm Hg ----- gradient, D Max 99. cm/s ----- inflow 2 vel VTI at 33. cm ----- annulus 5 Systemic veins Estimated 5 mm Hg ----- CVP Right ventricle Sa vel, 10. cm/s ----- lat ann, 4 tiss DP  ------------------------------------------------------------ Prepared and Electronically Authenticated by  Jenkins Rouge 2014-09-09T12:36:[id number]    Impression:  Patient is doing very well more than 6 months status post mitral valve repair, tricuspid valve repair, and Maze procedure. She is maintaining sinus rhythm and has no signs nor symptoms of congestive heart failure.  Plan:  I've instructed the patient to stop taking amiodarone and to let the pharmacist at the Coumadin clinic now that she is no longer taking amiodarone. We will plan to see her back in 6 months for routine followup and rhythm check. We have discussed endocarditis prophylaxis, although the patient is edentulous.   Valentina Gu. Roxy Manns, MD 05/27/2013 10:53 AM

## 2013-06-19 ENCOUNTER — Other Ambulatory Visit: Payer: Self-pay | Admitting: Cardiovascular Disease

## 2013-07-22 ENCOUNTER — Other Ambulatory Visit: Payer: Self-pay | Admitting: Cardiovascular Disease

## 2013-08-13 ENCOUNTER — Ambulatory Visit (INDEPENDENT_AMBULATORY_CARE_PROVIDER_SITE_OTHER): Payer: Medicare Other | Admitting: Physician Assistant

## 2013-08-13 ENCOUNTER — Encounter: Payer: Self-pay | Admitting: Physician Assistant

## 2013-08-13 VITALS — BP 120/68 | HR 66 | Ht 66.5 in | Wt 156.0 lb

## 2013-08-13 DIAGNOSIS — G4700 Insomnia, unspecified: Secondary | ICD-10-CM

## 2013-08-13 DIAGNOSIS — Z9889 Other specified postprocedural states: Secondary | ICD-10-CM

## 2013-08-13 DIAGNOSIS — I6529 Occlusion and stenosis of unspecified carotid artery: Secondary | ICD-10-CM | POA: Insufficient documentation

## 2013-08-13 DIAGNOSIS — I10 Essential (primary) hypertension: Secondary | ICD-10-CM

## 2013-08-13 DIAGNOSIS — I2510 Atherosclerotic heart disease of native coronary artery without angina pectoris: Secondary | ICD-10-CM | POA: Insufficient documentation

## 2013-08-13 DIAGNOSIS — I4891 Unspecified atrial fibrillation: Secondary | ICD-10-CM

## 2013-08-13 NOTE — Patient Instructions (Signed)
OK TO TAKE OTC MELATONIN TO HELP YOU SLEEP; PER SCOTT WEAVER, PAC START OFF WITH THE LOWEST DOSE   Your physician wants you to follow-up in: [place] DR. Angelena Form. You will receive a reminder letter in the mail two months in advance. If you don't receive a letter, please call our office to schedule the follow-up appointment.

## 2013-08-13 NOTE — Progress Notes (Signed)
[place], [name], [place]  [address] Phone: [phone] Fax:  [phone]  Date:  08/13/2013   [name], DOB 26-Sep-1940, MRN [id number]  PCP:  Merrilee Seashore, MD  Cardiologist:  Dr. Lauree Chandler      History of Present Illness: Rachel Santos is a 73 y.o. female with a hx of mitral regurgitation, DM, HTN, OA, atrial fibrillation, GERD, cataracts.  Initially seen by Dr. Lauree Chandler for evaluation of atrial fibrillation and severe MR on 07/03/12. She was diagnosed with atrial fibrillation on 06/27/12 in primary care and was started on Metoprolol 50 mg po BID for rate control and Xarelto 20 mg po Qdaily. Echo with normal LV size and function severe MR, moderate TR. She had a TEE guided cardioversion on Jul 31, 2012 with return to sinus rhythm. Her MR was noted to be central and severe. The LV cavity was not dilated. LV systolic function was normal. There was moderate to severe TR as well. She converted to NSR. She was felt to be symptomatic from her mitral valve disease. Cardiac cath on 09/05/12 with mild CAD. She underwent placement of mitral valve ring and tricuspid valve ring as well as MAZE procedure per Dr. Roxy Manns on 10/11/12. Post-operative course complicated by recurrence of atrial fibrillation with bradycardia. She was seen by Dr. Caryl Comes as an EP consult and pacemaker not recommended. She was discharged home on amiodarone and coumadin. Echo 12/04/12 with normal LV size and function, MR and TV repair intact.  Last seen by Dr. Lauree Chandler in 01/2013.    She returns for follow up. She is doing well. Her breathing is stable. She describes NYHA class 2-2b symptoms. She denies chest pain. She denies orthopnea, PND or edema. She denies syncope.  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 She saw Dr. Roxy Manns in March. Her amiodarone was discontinued. Her Coumadin is followed by primary care.   Studies:  - R/L  HC (08/2012):  RA 6, RV 36/4/9, PA 37/13 (mean 25), PCWP 12, CO 3.8, CI 2.0; mild plaque (no sig CAD), EF 65-70%, 2-3+ MR  - Echo (12/04/12):  Mild LVH, EF 55-60%, no RWMA, mod RAE, MV and TV repair ok (mean MV gradient 7 mmHg; mean TV gradient 2 mmHg)  - Carotid US (06/2012):  0-39% bilateral ICA   Recent Labs: 10/09/2012: ALT 16  10/19/2012: Creatinine 0.61; Hemoglobin 9.0*; Potassium 4.4   Wt Readings from Last 3 Encounters:  08/13/13 156 lb (70.761 kg)  05/27/13 159 lb (72.122 kg)  02/19/13 159 lb (72.122 kg)     Past Medical History  Diagnosis Date  . Diabetes mellitus   . Arthritis   . Hypertension   . Atrial fibrillation   . Mitral regurgitation   . GERD (gastroesophageal reflux disease)   . Cataracts, bilateral   . PONV (postoperative nausea and vomiting)   . Tricuspid regurgitation   . Shortness of breath     with activity  . Stroke     mini stroke with vision problems  . 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    84mm Sorin Memo 3D ring annuloplasty with 26 mm Edwards mc3 tricuspid ring annuloplasty   . S/P Maze operation for atrial fibrillation 10/11/2012    Complete bilateral atrial lesion set using bipolar radiofrequency and cryothermy ablation with clipping of LA appendage  Current Outpatient Prescriptions  Medication Sig Dispense Refill  . amLODipine (NORVASC) 10 MG tablet TAKE 1 TABLET BY MOUTH DAILY.  30 tablet  0  . calcium carbonate 200 MG capsule Calcium citrate  /vitamin D 630mg  (calicum)  1 tab daily      . glipiZIDE (GLUCOTROL) 5 MG tablet Take 5 mg by mouth daily before breakfast.      . insulin lispro (HUMALOG KWIKPEN) 100 UNIT/ML SOPN Inject 12-15 Units into the skin daily. After dinner.      . Lancets 28G MISC Lancets and test strips      . LEVEMIR FLEXPEN 100 UNIT/ML injection Inject 20-40 Units into the skin 2 (two) times daily. 40 units AM, 20 units pm      . losartan  (COZAAR) 100 MG tablet Take 1 tablet (100 mg total) by mouth daily.  30 tablet  1  . lovastatin (MEVACOR) 40 MG tablet TAKE 1 TABLET BY MOUTH DAILY AT BEDTIME  30 tablet  1  . metFORMIN (GLUCOPHAGE) 500 MG tablet Take 1,000 mg by mouth 2 (two) times daily with a meal.       . Multiple Vitamin (MULTIVITAMIN) tablet Take 1 tablet by mouth daily.      Marland Kitchen omeprazole (PRILOSEC) 40 MG capsule Take 40 mg by mouth daily.       Marland Kitchen PARoxetine (PAXIL) 30 MG tablet Take 30 mg by mouth daily.       Marland Kitchen warfarin (COUMADIN) 3 MG tablet Take 1 tablet (3 mg total) by mouth daily at 6 PM. Or as directed.  30 tablet  1   No current facility-administered medications for this visit.    Allergies:   Ultram   Social History:  The patient  reports that she quit smoking about 6 years ago. Her smoking use included Cigarettes. She smoked 0.00 packs per day. She has never used smokeless tobacco. She reports that she does not drink alcohol or use illicit drugs.   Family History:  The patient's family history includes Cancer in her father; Heart attack in her paternal grandfather.   ROS:  Please see the history of present illness.   She has a lot of difficulty sleeping.   All other systems reviewed and negative.   PHYSICAL EXAM: VS:  BP 120/68  Pulse 66  Ht 5' 6.5" (1.689 m)  Wt 156 lb (70.761 kg)  BMI 24.80 kg/m2 Well nourished, well developed, in no acute distress HEENT: normal Neck: no JVD Cardiac:  normal S1, S2; RRR; 1/6 systolic murmur heard best at the LUSB Lungs:  clear to auscultation bilaterally, no wheezing, rhonchi or rales Abd: soft, nontender, no hepatomegaly Ext: no edema Skin: warm and dry Neuro:  CNs 2-12 intact, no focal abnormalities noted  EKG:  NSR, HR 66, normal axis, no ST changes     ASSESSMENT AND PLAN:  1. Atrial fibrillation: She is maintaining NSR. She is status post Maze procedure. She is now off of amiodarone. Coumadin is managed by primary care. 2. S/P mitral valve repair,  tricuspid valve repair + maze procedure: Mitral and tricuspid valve repairs stable at most recent echocardiogram. Continue SBE prophylaxis. 3. Mimimal CAD by Cath 2014:No angina. She is not on aspirin she is on Coumadin. Continue statin. 4. Carotid stenosis: Repeat carotid US scheduled for next month. 5. Insomnia: She may try over-the-counter melatonin. Otherwise, she should follow up with primary care for further management. 6. Hypertension: She does have some symptoms of orthostatic intolerance. I have asked her to  use compression stockings as much as possible. If she continues to have problems, we may need to cut back on her Norvasc to 5 mg daily. 7. Disposition: Follow up with Dr. Lauree Chandler in 6 months.  Signed, Richardson Dopp, PA-C  08/13/2013 11:46 AM

## 2013-08-21 ENCOUNTER — Other Ambulatory Visit: Payer: Self-pay | Admitting: Cardiovascular Disease

## 2013-09-16 ENCOUNTER — Other Ambulatory Visit (HOSPITAL_COMMUNITY): Payer: Self-pay | Admitting: Cardiology

## 2013-09-16 DIAGNOSIS — I6529 Occlusion and stenosis of unspecified carotid artery: Secondary | ICD-10-CM

## 2013-09-23 ENCOUNTER — Ambulatory Visit (HOSPITAL_COMMUNITY): Payer: Medicare Other | Attending: Cardiovascular Disease | Admitting: Cardiology

## 2013-09-23 DIAGNOSIS — R42 Dizziness and giddiness: Secondary | ICD-10-CM | POA: Insufficient documentation

## 2013-09-23 DIAGNOSIS — I6529 Occlusion and stenosis of unspecified carotid artery: Secondary | ICD-10-CM

## 2013-09-23 NOTE — Progress Notes (Signed)
Carotid duplex performed 

## 2013-12-09 ENCOUNTER — Ambulatory Visit (INDEPENDENT_AMBULATORY_CARE_PROVIDER_SITE_OTHER): Payer: Medicare Other | Admitting: Thoracic Surgery (Cardiothoracic Vascular Surgery)

## 2013-12-09 ENCOUNTER — Encounter: Payer: Self-pay | Admitting: Thoracic Surgery (Cardiothoracic Vascular Surgery)

## 2013-12-09 VITALS — BP 107/64 | HR 69 | Resp 16 | Ht 66.5 in | Wt 153.0 lb

## 2013-12-09 DIAGNOSIS — I079 Rheumatic tricuspid valve disease, unspecified: Secondary | ICD-10-CM

## 2013-12-09 DIAGNOSIS — I340 Nonrheumatic mitral (valve) insufficiency: Secondary | ICD-10-CM

## 2013-12-09 DIAGNOSIS — Z9889 Other specified postprocedural states: Secondary | ICD-10-CM

## 2013-12-09 DIAGNOSIS — Z8679 Personal history of other diseases of the circulatory system: Secondary | ICD-10-CM

## 2013-12-09 DIAGNOSIS — I059 Rheumatic mitral valve disease, unspecified: Secondary | ICD-10-CM

## 2013-12-09 DIAGNOSIS — I071 Rheumatic tricuspid insufficiency: Secondary | ICD-10-CM

## 2013-12-09 DIAGNOSIS — I4891 Unspecified atrial fibrillation: Secondary | ICD-10-CM

## 2013-12-09 DIAGNOSIS — I4819 Other persistent atrial fibrillation: Secondary | ICD-10-CM

## 2013-12-09 NOTE — Progress Notes (Addendum)
[address]       [place] [address]             [phone]     CARDIOTHORACIC SURGERY OFFICE NOTE  Referring Provider is Stanford Breed, Denice Bors, MD Primary Cardiologist is [name], MD PCP is Merrilee Seashore, MD   HPI:  Patient returns for routine followup approximately 1 year status post mitral valve repair, tricuspid valve repair, and Maze procedure on 10/11/2012. She was last seen here for followup on 05/27/2013 and more recently she was seen for follow up by Richardson Dopp on 08/13/2013.  She has been maintaining sinus rhythm for her last several office visits, and she currently has a Holter monitor in place as part of a study protocol she participated in at the time of her surgery to further investigate whether or not she is maintaining sinus rhythm continuously. She remains on long-term warfarin anticoagulation. Clinically she is doing very well. She states that her activity level is good. She describes stable symptoms of exertional shortness of breath which only occur with relatively strenuous activity. This only slightly limits her physical activities and does not impact on her day-to-day lifestyle whatsoever. She does not get any chest pain or chest tightness. She has not had any tachypalpitations or dizzy spells. She has not had any complications or problems related to anticoagulation using warfarin.    Current Outpatient Prescriptions  Medication Sig Dispense Refill  . amLODipine (NORVASC) 10 MG tablet TAKE 1 TABLET BY MOUTH DAILY.  30 tablet  5  . calcium carbonate 200 MG capsule Calcium citrate  /vitamin D 630mg  (calicum)  1 tab daily      . glipiZIDE (GLUCOTROL) 5 MG tablet Take 5 mg by mouth daily before breakfast.      . insulin lispro (HUMALOG KWIKPEN) 100 UNIT/ML SOPN Inject 12-15 Units into the skin daily. 4 UNITS/SUPPER      . Lancets 28G MISC Lancets and test strips      . LEVEMIR FLEXPEN 100 UNIT/ML injection Inject 20-40 Units  into the skin 2 (two) times daily. 40 units AM, 22 units pm      . losartan (COZAAR) 100 MG tablet Take 1 tablet (100 mg total) by mouth daily.  30 tablet  1  . lovastatin (MEVACOR) 40 MG tablet TAKE 1 TABLET BY MOUTH DAILY AT BEDTIME  30 tablet  5  . Multiple Vitamin (MULTIVITAMIN) tablet Take 1 tablet by mouth daily.      Marland Kitchen omeprazole (PRILOSEC) 40 MG capsule Take 40 mg by mouth daily.       Marland Kitchen PARoxetine (PAXIL) 30 MG tablet Take 30 mg by mouth daily.       Marland Kitchen warfarin (COUMADIN) 3 MG tablet Take 1 tablet (3 mg total) by mouth daily at 6 PM. Or as directed.  30 tablet  1   No current facility-administered medications for this visit.      Physical Exam:   BP 107/64  Pulse 69  Resp 16  Ht 5' 6.5" (1.689 m)  Wt 153 lb (69.4 kg)  BMI 24.33 kg/m2  SpO2 96%  General:  Well-appearing  Chest:   clear  CV:   Regular rate and rhythm without murmur  Incisions:  Completely healed  Abdomen:  Soft and nontender  Extremities:  Warm and well-perfused, no edema  Diagnostic Tests:  2 channel telemetry rhythm strip demonstrates normal sinus rhythm   Impression:  Patient is clinically doing very well more than one  year status post mitral valve repair, tricuspid valve repair, and Maze procedure. She is maintaining sinus rhythm. She describes stable symptoms of exertional shortness of breath consistent with chronic diastolic congestive heart failure, [place] Heart Association functional class II.  At the time of her most recent echocardiogram performed nearly one year ago both her mitral and tricuspid valve repairs appeared intact with no significant regurgitation and the patient had normal left ventricular systolic function.    Plan:  We have not made any changes to the patient's current medications. She patient will be referred to the atrial fibrillation clinic for long-term followup status post Maze procedure. She will continue to followup with Dr. Angelena Form. In the future she will call and  return to see [place] here as needed should further problems or difficulties arise. All of her questions been addressed.  I spent in excess of 15 minutes during the conduct of this office consultation and >50% of this time involved direct face-to-face encounter with the patient for counseling and/or coordination of their care.    Valentina Gu. Roxy Manns, MD 12/09/2013 1:45 PM

## 2014-01-07 ENCOUNTER — Ambulatory Visit (HOSPITAL_COMMUNITY): Payer: Medicare Other | Admitting: Nurse Practitioner

## 2014-03-06 ENCOUNTER — Ambulatory Visit: Payer: Medicare Other | Admitting: Cardiovascular Disease

## 2014-04-20 ENCOUNTER — Encounter (HOSPITAL_COMMUNITY): Payer: Self-pay | Admitting: Emergency Medicine

## 2014-04-20 ENCOUNTER — Emergency Department (HOSPITAL_COMMUNITY)
Admission: EM | Admit: 2014-04-20 | Discharge: 2014-04-20 | Disposition: A | Discharge status: Home / Self Care | Payer: Medicare Other | Attending: Emergency Medicine | Admitting: Emergency Medicine

## 2014-04-20 ENCOUNTER — Emergency Department (HOSPITAL_COMMUNITY): Payer: Medicare Other

## 2014-04-20 DIAGNOSIS — Y92828 Other wilderness area as the place of occurrence of the external cause: Secondary | ICD-10-CM | POA: Insufficient documentation

## 2014-04-20 DIAGNOSIS — M25561 Pain in right knee: Secondary | ICD-10-CM | POA: Diagnosis not present

## 2014-04-20 DIAGNOSIS — W19XXXA Unspecified fall, initial encounter: Secondary | ICD-10-CM

## 2014-04-20 DIAGNOSIS — I10 Essential (primary) hypertension: Secondary | ICD-10-CM | POA: Diagnosis not present

## 2014-04-20 DIAGNOSIS — Z79899 Other long term (current) drug therapy: Secondary | ICD-10-CM | POA: Insufficient documentation

## 2014-04-20 DIAGNOSIS — I4891 Unspecified atrial fibrillation: Secondary | ICD-10-CM | POA: Diagnosis not present

## 2014-04-20 DIAGNOSIS — Z954 Presence of other heart-valve replacement: Secondary | ICD-10-CM | POA: Diagnosis not present

## 2014-04-20 DIAGNOSIS — Z7901 Long term (current) use of anticoagulants: Secondary | ICD-10-CM | POA: Insufficient documentation

## 2014-04-20 DIAGNOSIS — Z794 Long term (current) use of insulin: Secondary | ICD-10-CM | POA: Insufficient documentation

## 2014-04-20 DIAGNOSIS — S8991XA Unspecified injury of right lower leg, initial encounter: Secondary | ICD-10-CM | POA: Diagnosis not present

## 2014-04-20 DIAGNOSIS — W000XXA Fall on same level due to ice and snow, initial encounter: Secondary | ICD-10-CM | POA: Diagnosis not present

## 2014-04-20 DIAGNOSIS — Y9301 Activity, walking, marching and hiking: Secondary | ICD-10-CM | POA: Insufficient documentation

## 2014-04-20 DIAGNOSIS — Y998 Other external cause status: Secondary | ICD-10-CM | POA: Insufficient documentation

## 2014-04-20 DIAGNOSIS — K219 Gastro-esophageal reflux disease without esophagitis: Secondary | ICD-10-CM | POA: Diagnosis not present

## 2014-04-20 DIAGNOSIS — Z8673 Personal history of transient ischemic attack (TIA), and cerebral infarction without residual deficits: Secondary | ICD-10-CM | POA: Diagnosis not present

## 2014-04-20 DIAGNOSIS — S3991XA Unspecified injury of abdomen, initial encounter: Secondary | ICD-10-CM | POA: Insufficient documentation

## 2014-04-20 DIAGNOSIS — H269 Unspecified cataract: Secondary | ICD-10-CM | POA: Insufficient documentation

## 2014-04-20 DIAGNOSIS — Z8739 Personal history of other diseases of the musculoskeletal system and connective tissue: Secondary | ICD-10-CM | POA: Insufficient documentation

## 2014-04-20 DIAGNOSIS — E119 Type 2 diabetes mellitus without complications: Secondary | ICD-10-CM | POA: Diagnosis not present

## 2014-04-20 MED ORDER — OXYCODONE-ACETAMINOPHEN 5-325 MG PO TABS: 1.0000 | ORAL_TABLET | Freq: Four times a day (QID) | ORAL | Status: DC | PRN

## 2014-04-20 MED ORDER — OXYCODONE-ACETAMINOPHEN 5-325 MG PO TABS
1.0000 | ORAL_TABLET | Freq: Once | ORAL | Status: AC
  Administered 2014-04-20: 1 via ORAL
  Filled 2014-04-20: qty 1

## 2014-04-20 NOTE — ED Provider Notes (Signed)
CSN: [id number]     Arrival date & time 04/20/14  1816 History   First MD Initiated Contact with Patient 04/20/14 1936     Chief Complaint  Patient presents with  . Knee Pain     (Consider location/radiation/quality/duration/timing/severity/associated sxs/prior Treatment) Patient is a 74 y.o. female presenting with knee pain. The history is provided by the patient.  Knee Pain Location:  Knee Time since incident:  8 hours Injury: yes   Mechanism of injury: fall   Fall:    Fall occurred: on her side on a hill.   Height of fall:  Standing   Impact surface: ground.   Point of impact: knee.   Entrapped after fall: no   Knee location:  R knee Pain details:    Quality:  Aching   Radiates to:  Does not radiate   Severity:  Moderate   Onset quality:  Sudden   Timing:  Constant   Progression:  Unchanged Chronicity:  New Associated symptoms: no back pain, no fatigue, no fever and no neck pain     Past Medical History  Diagnosis Date  . Diabetes mellitus   . Arthritis   . Hypertension   . Atrial fibrillation   . Mitral regurgitation   . GERD (gastroesophageal reflux disease)   . Cataracts, bilateral   . PONV (postoperative nausea and vomiting)   . Tricuspid regurgitation   . Shortness of breath     with activity  . Stroke     mini stroke with vision problems  . 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 S/P Maze operation for atrial fibrillation 10/11/2012    Complete bilateral atrial lesion set using bipolar radiofrequency and cryothermy ablation with clipping of LA appendage   Past Surgical History  Procedure Laterality Date  . Mastectomy Left 1982  . Cesarean section      x 2  . Tee without cardioversion N/A 07/31/2012    Procedure: TRANSESOPHAGEAL ECHOCARDIOGRAM (TEE);  Surgeon: Josue Hector, MD;   Location: [place];  Service: Cardiovascular;  Laterality: N/A;  . Cardioversion N/A 07/31/2012    Procedure: CARDIOVERSION;  Surgeon: Josue Hector, MD;  Location: [facility] ENDOSCOPY;  Service: Cardiovascular;  Laterality: N/A;  . Tubal ligation    . Cardiac catheterization  09-05-12  . Tricuspid valve replacement N/A 10/11/2012    Procedure: TRICUSPID VALVE REPAIR;  Surgeon: Rexene Alberts, MD;  Location: [place];  Service: Open Heart Surgery;  Laterality: N/A;  . Maze N/A 10/11/2012    Procedure: MAZE;  Surgeon: Rexene Alberts, MD;  Location: [place];  Service: Open Heart Surgery;  Laterality: N/A;  . Intraoperative transesophageal echocardiogram N/A 10/11/2012    Procedure: INTRAOPERATIVE TRANSESOPHAGEAL ECHOCARDIOGRAM;  Surgeon: Rexene Alberts, MD;  Location: [place];  Service: Open Heart Surgery;  Laterality: N/A;  . Mitral valve repair N/A 10/11/2012    Procedure: MITRAL VALVE REPAIR (MVR);  Surgeon: Rexene Alberts, MD;  Location: [place];  Service: Open Heart Surgery;  Laterality: N/A;   Family History  Problem Relation Age of Onset  . Cancer Father   . Heart attack Paternal Grandfather    History  Substance Use Topics  . Smoking status: Never Smoker   . Smokeless tobacco: Never Used  . Alcohol Use: No   OB  History    No data available     Review of Systems  Constitutional: Negative for fever and fatigue.  HENT: Negative for congestion and drooling.   Eyes: Negative for pain.  Respiratory: Negative for cough and shortness of breath.   Cardiovascular: Negative for chest pain.  Gastrointestinal: Negative for nausea, vomiting, abdominal pain and diarrhea.  Genitourinary: Negative for dysuria and hematuria.  Musculoskeletal: Negative for back pain, gait problem and neck pain.  Skin: Negative for color change.  Neurological: Negative for dizziness and headaches.  Hematological: Negative for adenopathy.  Psychiatric/Behavioral: Negative for behavioral problems.  All other systems  reviewed and are negative.     Allergies  Ultram  Home Medications   Prior to Admission medications   Medication Sig Start Date End Date Taking? Authorizing Provider  amLODipine (NORVASC) 10 MG tablet TAKE 1 TABLET BY MOUTH DAILY.    [name], MD  glipiZIDE (GLUCOTROL) 5 MG tablet Take 5 mg by mouth daily before breakfast.    Historical Provider, MD  insulin lispro (HUMALOG KWIKPEN) 100 UNIT/ML SOPN Inject 12-15 Units into the skin daily. 4 UNITS/SUPPER    Historical Provider, MD  Lancets 28G MISC Lancets and test strips 06/25/12   Historical Provider, MD  LEVEMIR FLEXPEN 100 UNIT/ML injection Inject 20-40 Units into the skin 2 (two) times daily. 40 units AM, 22 units pm 06/25/12   Historical Provider, MD  losartan (COZAAR) 100 MG tablet Take 1 tablet (100 mg total) by mouth daily. 10/22/12   Donielle Liston Alba, PA-C  lovastatin (MEVACOR) 40 MG tablet TAKE 1 TABLET BY MOUTH DAILY AT BEDTIME    [name], MD  Multiple Vitamin (MULTIVITAMIN) tablet Take 1 tablet by mouth daily.    Historical Provider, MD  omeprazole (PRILOSEC) 40 MG capsule Take 40 mg by mouth daily.  06/28/12   Historical Provider, MD  PARoxetine (PAXIL) 30 MG tablet Take 30 mg by mouth daily.  06/28/12   Historical Provider, MD  warfarin (COUMADIN) 3 MG tablet Take 1 tablet (3 mg total) by mouth daily at 6 PM. Or as directed. 10/22/12   Donielle Liston Alba, PA-C   BP 131/88 mmHg  Pulse 78  Temp(Src) 98.1 F (36.7 C) (Oral)  Resp 18  SpO2 100% Physical Exam  Constitutional: She is oriented to person, place, and time. She appears well-developed and well-nourished.  HENT:  Head: Normocephalic.  Mouth/Throat: Oropharynx is clear and moist. No oropharyngeal exudate.  Eyes: Conjunctivae and EOM are normal. Pupils are equal, round, and reactive to light.  Neck: Normal range of motion. Neck supple.  No cervical vertebral tenderness.  Cardiovascular: Normal rate, regular rhythm, normal heart sounds  and intact distal pulses.  Exam reveals no gallop and no friction rub.   No murmur heard. Pulmonary/Chest: Effort normal and breath sounds normal. No respiratory distress. She has no wheezes.  Abdominal: Soft. Bowel sounds are normal. There is no tenderness. There is no rebound and no guarding.  Musculoskeletal: She exhibits tenderness. She exhibits no edema.  Normal range of motion of the hips bilaterally. No hip pain with palpation bilaterally.  Mildly limited range of motion of the right knee due to pain. Lateral tenderness to palpation of the right knee. No obvious effusion noted on clinical exam.  Neurological: She is alert and oriented to person, place, and time.  Skin: Skin is warm and dry.  Psychiatric: She has a normal mood and affect. Her behavior is normal.  Nursing note and vitals reviewed.  ED Course  Procedures (including critical care time) Labs Review Labs Reviewed - No data to display  Imaging Review Dg Knee Complete 4 Views Right  04/20/2014   CLINICAL DATA:  Acute onset of right knee pain status post fall. Initial encounter.  EXAM: RIGHT KNEE - COMPLETE 4+ VIEW  COMPARISON:  None.  FINDINGS: There is no evidence of fracture or dislocation. There is mild narrowing of the medial compartment, with marginal osteophytes seen arising at the medial and lateral compartments. A prominent 2.0 cm loose body is noted at the suprapatellar joint space. There is slight cortical irregularity along the articular surface of the patella.  No significant joint effusion is seen. Scattered vascular calcifications are seen.  IMPRESSION: 1. No evidence of fracture or dislocation. 2. 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 3. Scattered vascular calcifications seen.   Electronically Signed   By: Garald Balding M.D.   On: 04/20/2014 20:17     EKG Interpretation None      MDM   Final diagnoses:  Right knee pain    8:58 PM  73 y.o. female w hx of DM, HTN, afib on coumadin who presents with a mechanical fall which occurred around 1 PM today. She states that she was walking down a snowy hill to get her dog. She fell onto her right side. She denies hitting her head or loss of consciousness. Her only complaint is right knee pain. No HA. She is afebrile and vital signs are unremarkable here. Plain films are noncontributory.  9:50 PM: Will provide knee immobilizer and crutches. I have discussed the diagnosis/risks/treatment options with the patient and family and believe the pt to be eligible for discharge home to follow-up with her pcp in 1 week if no better. We also discussed returning to the ED immediately if new or worsening sx occur. We discussed the sx which are most concerning (e.g., worsening pain, HA) that necessitate immediate return. Medications administered to the patient during their visit and any new prescriptions provided to the patient are listed below.  Medications given during this visit Medications  oxyCODONE-acetaminophen (PERCOCET/ROXICET) 5-325 MG per tablet 1 tablet (1 tablet Oral Given 04/20/14 2043)    New Prescriptions   OXYCODONE-ACETAMINOPHEN (PERCOCET) 5-325 MG PER TABLET    Take 1 tablet by mouth every 6 (six) hours as needed for moderate pain.     Pamella Pert, MD 04/21/14 2210

## 2014-04-20 NOTE — ED Notes (Signed)
Awake. Verbally responsive. A/O x4. Resp even and unlabored. No audible adventitious breath sounds noted. ABC's intact. NAD noted. 

## 2014-04-20 NOTE — ED Notes (Addendum)
Pt from home c/o right knee pain from falling in the snow. She took hydrocodone at 1630 without relief. No LOC

## 2014-04-20 NOTE — ED Notes (Signed)
Awake. Verbally responsive. A/O x4. Resp even and unlabored. No audible adventitious breath sounds noted. ABC's intact. Family at bedside. 

## 2014-04-29 DIAGNOSIS — I4891 Unspecified atrial fibrillation: Secondary | ICD-10-CM | POA: Diagnosis not present

## 2014-04-29 DIAGNOSIS — Z7901 Long term (current) use of anticoagulants: Secondary | ICD-10-CM | POA: Diagnosis not present

## 2014-05-01 DIAGNOSIS — M1711 Unilateral primary osteoarthritis, right knee: Secondary | ICD-10-CM | POA: Diagnosis not present

## 2014-05-27 DIAGNOSIS — I4891 Unspecified atrial fibrillation: Secondary | ICD-10-CM | POA: Diagnosis not present

## 2014-05-27 DIAGNOSIS — Z7901 Long term (current) use of anticoagulants: Secondary | ICD-10-CM | POA: Diagnosis not present

## 2014-05-29 ENCOUNTER — Encounter: Payer: Self-pay | Admitting: Cardiovascular Disease

## 2014-05-29 ENCOUNTER — Ambulatory Visit (INDEPENDENT_AMBULATORY_CARE_PROVIDER_SITE_OTHER): Payer: Medicare Other | Admitting: Cardiovascular Disease

## 2014-05-29 VITALS — BP 110/72 | HR 59 | Ht 66.5 in | Wt 154.2 lb

## 2014-05-29 DIAGNOSIS — I480 Paroxysmal atrial fibrillation: Secondary | ICD-10-CM | POA: Diagnosis not present

## 2014-05-29 DIAGNOSIS — I071 Rheumatic tricuspid insufficiency: Secondary | ICD-10-CM | POA: Diagnosis not present

## 2014-05-29 DIAGNOSIS — I340 Nonrheumatic mitral (valve) insufficiency: Secondary | ICD-10-CM | POA: Diagnosis not present

## 2014-05-29 DIAGNOSIS — I2510 Atherosclerotic heart disease of native coronary artery without angina pectoris: Secondary | ICD-10-CM | POA: Diagnosis not present

## 2014-05-29 NOTE — Progress Notes (Signed)
History of Present Illness: 74 yo female with history of DM, HTN, OA, atrial fibrillation, GERD, cataracts, who is here today for cardiac follow up. I saw her as a new patient for evaluation of atrial fibrillation and severe MR on 07/03/12. She was diagnosed with atrial fibrillation on 06/27/12 in primary care and was started on Metoprolol 50 mg po BID for rate control and Xarelto 20 mg po Qdaily. Echo with normal LV size and function severe MR, moderate TR. She had a TEE guided cardioversion on Jul 31, 2012 with return to sinus rhythm. Her MR was noted to be central and severe. The LV cavity was not dilated. LV systolic function was normal. There was moderate to severe TR as well. She converted to NSR. She was felt to be symptomatic from her mitral valve disease. Cardiac cath on 09/05/12 with mild CAD. She underwent placement of mitral valve ring and tricuspid valve ring as well as MAZE procedure per Dr. Roxy Manns on 10/11/12. Post-operative course complicated by recurrence of atrial fibrillation with bradycardia. She was seen by Dr. Caryl Comes as an EP consult and pacemaker not recommended. She was discharged home on amiodarone and coumadin. She was discharged home on Lasix for mild volume overload but this was stopped after discharge via phone messages due to dizziness and hypotension.  Echo 12/04/12 with normal LV size and function, MR and TV repair intact. Her amiodarone has been stopped. Carotid artery dopplers June 2015 with mild bilateral disease.   She is here today for follow up. She is feeling well. No chest pain or SOB. BP is stable at home. No palpitations. She does note some dizziness when first standing.   Primary Care Physician: Dr. Ashby Dawes [facility])   Past Medical History  Diagnosis Date  . Diabetes mellitus   . Arthritis   . Hypertension   . Atrial fibrillation   . Mitral regurgitation   . GERD (gastroesophageal reflux disease)   . Cataracts, bilateral   . PONV  (postoperative nausea and vomiting)   . Tricuspid regurgitation   . Shortness of breath     with activity  . Stroke     mini stroke with vision problems  . 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 S/P Maze operation for atrial fibrillation 10/11/2012    Complete bilateral atrial lesion set using bipolar radiofrequency and cryothermy ablation with clipping of LA appendage    Past Surgical History  Procedure Laterality Date  . Mastectomy Left 1982  . Cesarean section      x 2  . Tee without cardioversion N/A 07/31/2012    Procedure: TRANSESOPHAGEAL ECHOCARDIOGRAM (TEE);  Surgeon: Josue Hector, MD;  Location: [place];  Service: Cardiovascular;  Laterality: N/A;  . Cardioversion N/A 07/31/2012    Procedure: CARDIOVERSION;  Surgeon: Josue Hector, MD;  Location: [facility] ENDOSCOPY;  Service: Cardiovascular;  Laterality: N/A;  . Tubal ligation    . Cardiac catheterization  09-05-12  . Tricuspid valve replacement N/A 10/11/2012    Procedure: TRICUSPID VALVE REPAIR;  Surgeon: Rexene Alberts, MD;  Location: [place];  Service: Open Heart Surgery;  Laterality: N/A;  . Maze N/A 10/11/2012    Procedure: MAZE;  Surgeon: Rexene Alberts, MD;  Location: [place];  Service: Open Heart Surgery;  Laterality: N/A;  .  Intraoperative transesophageal echocardiogram N/A 10/11/2012    Procedure: INTRAOPERATIVE TRANSESOPHAGEAL ECHOCARDIOGRAM;  Surgeon: Rexene Alberts, MD;  Location: [place];  Service: Open Heart Surgery;  Laterality: N/A;  . Mitral valve repair N/A 10/11/2012    Procedure: MITRAL VALVE REPAIR (MVR);  Surgeon: Rexene Alberts, MD;  Location: [place];  Service: Open Heart Surgery;  Laterality: N/A;    Current Outpatient Prescriptions  Medication Sig Dispense Refill  . acetaminophen (TYLENOL) 325 MG tablet Take 650 mg by mouth every 6 (six)  hours as needed for moderate pain or headache.    Marland Kitchen amLODipine (NORVASC) 10 MG tablet TAKE 1 TABLET BY MOUTH DAILY. 30 tablet 5  . calcium carbonate (OS-CAL) 600 MG TABS tablet Take 600 mg by mouth daily with breakfast.    . glipiZIDE (GLUCOTROL XL) 10 MG 24 hr tablet Take 10 mg by mouth daily with breakfast.    . insulin lispro (HUMALOG KWIKPEN) 100 UNIT/ML SOPN Inject 8-10 Units into the skin daily before supper. Sliding scale    . LEVEMIR FLEXPEN 100 UNIT/ML injection Inject 10-22 Units into the skin 2 (two) times daily. 22 units AM, 10 units pm    . losartan (COZAAR) 100 MG tablet Take 1 tablet (100 mg total) by mouth daily. 30 tablet 1  . lovastatin (MEVACOR) 40 MG tablet TAKE 1 TABLET BY MOUTH DAILY AT BEDTIME 30 tablet 5  . Multiple Vitamin (MULTIVITAMIN) tablet Take 1 tablet by mouth daily.    Marland Kitchen omeprazole (PRILOSEC) 40 MG capsule Take 40 mg by mouth daily.     Marland Kitchen venlafaxine XR (EFFEXOR-XR) 150 MG 24 hr capsule Take 150 mg by mouth daily.    Marland Kitchen warfarin (COUMADIN) 3 MG tablet Take 1 tablet (3 mg total) by mouth daily at 6 PM. Or as directed. (Patient taking differently: Take 3-4.5 mg by mouth daily at 6 PM. Saturday, Sunday, Tuesday, and Thursday 3mg . Monday, Wednesday,and Friday 4.5mg ) 30 tablet 1  . ONE TOUCH ULTRA TEST test strip   2   No current facility-administered medications for this visit.    Allergies  Allergen Reactions  . Ultram [Tramadol] Nausea And Vomiting    History   Social History  . Marital Status: Widowed    Spouse Name: N/A  . Number of Children: 3  . Years of Education: N/A   Occupational History  . Retired-[employer]    Social History Main Topics  . Smoking status: Never Smoker   . Smokeless tobacco: Never Used  . Alcohol Use: No  . Drug Use: No  . Sexual Activity: No   Other Topics Concern  . Not on file   Social History Narrative    Family History  Problem Relation Age of Onset  . Cancer Father   . Heart attack  Paternal Grandfather     Review of Systems:  As stated in the HPI and otherwise negative.   BP 110/72 mmHg  Pulse 59  Ht 5' 6.5" (1.689 m)  Wt 154 lb 3.2 oz (69.945 kg)  BMI 24.52 kg/m2  Physical Examination: General: Well developed, well nourished, NAD HEENT: OP clear, mucus membranes moist SKIN: warm, dry. No rashes. Neuro: No focal deficits Musculoskeletal: Muscle strength 5/5 all ext Psychiatric: Mood and affect normal Neck: No JVD, no carotid bruits, no thyromegaly, no lymphadenopathy. Lungs:Clear bilaterally, no wheezes, rhonci, crackles Cardiovascular: Regular rate and rhythm. No murmurs, gallops or rubs. Abdomen:Soft. Bowel sounds present. Non-tender.  Extremities: No lower extremity edema. Pulses are 2 + in  the bilateral DP/PT.  EKG: Sinus brady, rate 59 bpm.    Echo 12/04/12: Left ventricle: The cavity size was normal. Wall thickness was increased in a pattern of mild LVH. Systolic function was normal. The estimated ejection fraction was in the range of 55% to 60%. Wall motion was normal; there were no regional wall motion abnormalities. Left ventricular diastolic function parameters were normal. - Mitral valve: Prior procedures included surgical repair. - Left atrium: The atrium was moderately dilated. - Atrial septum: No defect or patent foramen ovale was identified. - Tricuspid valve: Prior procedures included surgical repair.  Assessment and Plan:   1. Atrial fibrillation, paroxysmal: Sinus today. Continue coumadin therapy given her history of PAF and prior TIA.   2. Mitral regurgitation: s/p mitral valve ring July 2014. Repair stable by echo September 2014. Will repeat after next appt in 6 months  3. Tricuspid regurgitation: s/p tricuspid ring July 2014. Repair stable by echo September 2014.   4. CAD: Mild by cath 2014. Continue ASA and statin.

## 2014-05-29 NOTE — Patient Instructions (Signed)
Your physician wants you to follow-up in:  6 months. You will receive a reminder letter in the mail two months in advance. If you don't receive a letter, please call our office to schedule the follow-up appointment.   

## 2014-06-11 DIAGNOSIS — E1065 Type 1 diabetes mellitus with hyperglycemia: Secondary | ICD-10-CM | POA: Diagnosis not present

## 2014-06-17 DIAGNOSIS — E782 Mixed hyperlipidemia: Secondary | ICD-10-CM | POA: Diagnosis not present

## 2014-06-17 DIAGNOSIS — I4891 Unspecified atrial fibrillation: Secondary | ICD-10-CM | POA: Diagnosis not present

## 2014-06-17 DIAGNOSIS — E1065 Type 1 diabetes mellitus with hyperglycemia: Secondary | ICD-10-CM | POA: Diagnosis not present

## 2014-06-17 DIAGNOSIS — I10 Essential (primary) hypertension: Secondary | ICD-10-CM | POA: Diagnosis not present

## 2014-06-26 DIAGNOSIS — I4891 Unspecified atrial fibrillation: Secondary | ICD-10-CM | POA: Diagnosis not present

## 2014-06-26 DIAGNOSIS — Z7901 Long term (current) use of anticoagulants: Secondary | ICD-10-CM | POA: Diagnosis not present

## 2014-07-24 DIAGNOSIS — I4891 Unspecified atrial fibrillation: Secondary | ICD-10-CM | POA: Diagnosis not present

## 2014-07-24 DIAGNOSIS — Z7901 Long term (current) use of anticoagulants: Secondary | ICD-10-CM | POA: Diagnosis not present

## 2014-08-10 DIAGNOSIS — Z23 Encounter for immunization: Secondary | ICD-10-CM | POA: Diagnosis not present

## 2014-08-10 DIAGNOSIS — S61452A Open bite of left hand, initial encounter: Secondary | ICD-10-CM | POA: Diagnosis not present

## 2014-08-10 DIAGNOSIS — W5501XA Bitten by cat, initial encounter: Secondary | ICD-10-CM | POA: Diagnosis not present

## 2014-08-14 DIAGNOSIS — Z2821 Immunization not carried out because of patient refusal: Secondary | ICD-10-CM | POA: Diagnosis not present

## 2014-08-14 DIAGNOSIS — I4891 Unspecified atrial fibrillation: Secondary | ICD-10-CM | POA: Diagnosis not present

## 2014-08-14 DIAGNOSIS — Z7901 Long term (current) use of anticoagulants: Secondary | ICD-10-CM | POA: Diagnosis not present

## 2014-09-18 DIAGNOSIS — Z7901 Long term (current) use of anticoagulants: Secondary | ICD-10-CM | POA: Diagnosis not present

## 2014-09-18 DIAGNOSIS — I4891 Unspecified atrial fibrillation: Secondary | ICD-10-CM | POA: Diagnosis not present

## 2014-09-25 DIAGNOSIS — E1065 Type 1 diabetes mellitus with hyperglycemia: Secondary | ICD-10-CM | POA: Diagnosis not present

## 2014-09-25 DIAGNOSIS — R5383 Other fatigue: Secondary | ICD-10-CM | POA: Diagnosis not present

## 2014-09-25 DIAGNOSIS — E782 Mixed hyperlipidemia: Secondary | ICD-10-CM | POA: Diagnosis not present

## 2014-09-25 DIAGNOSIS — Z0000 Encounter for general adult medical examination without abnormal findings: Secondary | ICD-10-CM | POA: Diagnosis not present

## 2014-10-02 DIAGNOSIS — N390 Urinary tract infection, site not specified: Secondary | ICD-10-CM | POA: Diagnosis not present

## 2014-10-02 DIAGNOSIS — E782 Mixed hyperlipidemia: Secondary | ICD-10-CM | POA: Diagnosis not present

## 2014-10-02 DIAGNOSIS — Z23 Encounter for immunization: Secondary | ICD-10-CM | POA: Diagnosis not present

## 2014-10-02 DIAGNOSIS — M810 Age-related osteoporosis without current pathological fracture: Secondary | ICD-10-CM | POA: Diagnosis not present

## 2014-10-02 DIAGNOSIS — I10 Essential (primary) hypertension: Secondary | ICD-10-CM | POA: Diagnosis not present

## 2014-10-02 DIAGNOSIS — E1065 Type 1 diabetes mellitus with hyperglycemia: Secondary | ICD-10-CM | POA: Diagnosis not present

## 2014-10-02 DIAGNOSIS — Z0000 Encounter for general adult medical examination without abnormal findings: Secondary | ICD-10-CM | POA: Diagnosis not present

## 2014-10-09 DIAGNOSIS — Z1212 Encounter for screening for malignant neoplasm of rectum: Secondary | ICD-10-CM | POA: Diagnosis not present

## 2014-10-09 DIAGNOSIS — Z1211 Encounter for screening for malignant neoplasm of colon: Secondary | ICD-10-CM | POA: Diagnosis not present

## 2014-10-21 DIAGNOSIS — E119 Type 2 diabetes mellitus without complications: Secondary | ICD-10-CM | POA: Diagnosis not present

## 2014-10-29 ENCOUNTER — Other Ambulatory Visit: Payer: Self-pay | Admitting: Gastroenterology

## 2014-10-29 DIAGNOSIS — E119 Type 2 diabetes mellitus without complications: Secondary | ICD-10-CM | POA: Diagnosis not present

## 2014-10-29 DIAGNOSIS — R195 Other fecal abnormalities: Secondary | ICD-10-CM | POA: Diagnosis not present

## 2014-10-29 DIAGNOSIS — I10 Essential (primary) hypertension: Secondary | ICD-10-CM | POA: Diagnosis not present

## 2014-10-30 DIAGNOSIS — Z7901 Long term (current) use of anticoagulants: Secondary | ICD-10-CM | POA: Diagnosis not present

## 2014-10-30 DIAGNOSIS — I4891 Unspecified atrial fibrillation: Secondary | ICD-10-CM | POA: Diagnosis not present

## 2014-11-19 ENCOUNTER — Encounter (HOSPITAL_COMMUNITY): Payer: Self-pay | Admitting: *Deleted

## 2014-11-27 NOTE — H&P (Signed)
Rachel Santos HPI: She has a positive Cologard test. The patient was also evaluated last year for complaints of diarrhea and Metformin was suspected to be a culprit. She was asked to hold the medication for 1-2 weeks. This resolved her diarrhea and she is still off of metformin. No complaints of hematochezia, melena, abdominal pain, nausea, vomiting, diarrhea, or constipation.  Past Medical History  Diagnosis Date  . Diabetes mellitus   . Hypertension   . Atrial fibrillation   . Mitral regurgitation   . GERD (gastroesophageal reflux disease)   . Cataracts, bilateral   . PONV (postoperative nausea and vomiting)   . Tricuspid regurgitation   . Shortness of breath     with activity  . Stroke     mini stroke with vision problems  . 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 S/P Maze operation for atrial fibrillation 10/11/2012    Complete bilateral atrial lesion set using bipolar radiofrequency and cryothermy ablation with clipping of LA appendage  . Arthritis     knees    Past Surgical History  Procedure Laterality Date  . Mastectomy Left 1982  . Cesarean section      x 2  . Tee without cardioversion N/A 07/31/2012    Procedure: TRANSESOPHAGEAL ECHOCARDIOGRAM (TEE);  Surgeon: Josue Hector, MD;  Location: [place];  Service: Cardiovascular;  Laterality: N/A;  . Cardioversion N/A 07/31/2012    Procedure: CARDIOVERSION;  Surgeon: Josue Hector, MD;  Location: [facility] ENDOSCOPY;  Service: Cardiovascular;  Laterality: N/A;  . Tubal ligation    . Cardiac catheterization  09-05-12  . Tricuspid valve replacement N/A 10/11/2012    Procedure: TRICUSPID VALVE REPAIR;  Surgeon: Rexene Alberts, MD;  Location: [place];  Service: Open Heart Surgery;  Laterality: N/A;  . Maze N/A 10/11/2012    Procedure: MAZE;  Surgeon: Rexene Alberts, MD;  Location: [place];  Service: Open Heart Surgery;  Laterality: N/A;  . Intraoperative transesophageal echocardiogram N/A 10/11/2012    Procedure: INTRAOPERATIVE TRANSESOPHAGEAL ECHOCARDIOGRAM;  Surgeon: Rexene Alberts, MD;  Location: [place];  Service: Open Heart Surgery;  Laterality: N/A;  . Mitral valve repair N/A 10/11/2012    Procedure: MITRAL VALVE REPAIR (MVR);  Surgeon: Rexene Alberts, MD;  Location: [place];  Service: Open Heart Surgery;  Laterality: N/A;    Family History  Problem Relation Age of Onset  . Cancer Father   . Heart attack Paternal Grandfather     Social History:  reports that she has never smoked. She has never used smokeless tobacco. She reports that she does not drink alcohol or use illicit drugs.  Allergies:  Allergies  Allergen Reactions  . Ultram [Tramadol] Nausea And Vomiting    Medications: Scheduled: Continuous:  No results found for this or any previous visit (from the past 24 hour(s)).   No results found.  ROS:  As stated above in the HPI otherwise negative.  There were no vitals taken for this visit.    PE: Gen: NAD, Alert and Oriented HEENT:  [place]/AT, EOMI Neck: Supple, no LAD Lungs: CTA Bilaterally CV: RRR without M/G/R ABM: Soft, NTND, +BS Ext: No C/C/E  Assessment/Plan: 1) Positive cologard - Colonoscopy.  Antibiotics will be administered as she has a history of a cardiac valve.  Rachel Santos  D 11/27/2014, 7:35 AM

## 2014-11-28 ENCOUNTER — Ambulatory Visit (HOSPITAL_COMMUNITY)
Admission: RE | Admit: 2014-11-28 | Discharge: 2014-11-28 | Disposition: A | Discharge status: Home / Self Care | Payer: Medicare Other | Source: Ambulatory Visit | Attending: Gastroenterology | Admitting: Gastroenterology

## 2014-11-28 ENCOUNTER — Ambulatory Visit (HOSPITAL_COMMUNITY): Payer: Medicare Other | Admitting: Anesthesiology

## 2014-11-28 ENCOUNTER — Encounter (HOSPITAL_COMMUNITY)
Admission: RE | Disposition: A | Discharge status: Home / Self Care | Payer: Self-pay | Source: Ambulatory Visit | Attending: Gastroenterology

## 2014-11-28 ENCOUNTER — Encounter (HOSPITAL_COMMUNITY): Payer: Self-pay | Admitting: Anesthesiology

## 2014-11-28 DIAGNOSIS — I69398 Other sequelae of cerebral infarction: Secondary | ICD-10-CM | POA: Diagnosis not present

## 2014-11-28 DIAGNOSIS — M1990 Unspecified osteoarthritis, unspecified site: Secondary | ICD-10-CM | POA: Insufficient documentation

## 2014-11-28 DIAGNOSIS — K5730 Diverticulosis of large intestine without perforation or abscess without bleeding: Secondary | ICD-10-CM | POA: Insufficient documentation

## 2014-11-28 DIAGNOSIS — H539 Unspecified visual disturbance: Secondary | ICD-10-CM | POA: Diagnosis not present

## 2014-11-28 DIAGNOSIS — R195 Other fecal abnormalities: Secondary | ICD-10-CM | POA: Insufficient documentation

## 2014-11-28 DIAGNOSIS — K219 Gastro-esophageal reflux disease without esophagitis: Secondary | ICD-10-CM | POA: Insufficient documentation

## 2014-11-28 DIAGNOSIS — D122 Benign neoplasm of ascending colon: Secondary | ICD-10-CM | POA: Insufficient documentation

## 2014-11-28 DIAGNOSIS — E119 Type 2 diabetes mellitus without complications: Secondary | ICD-10-CM | POA: Insufficient documentation

## 2014-11-28 DIAGNOSIS — I10 Essential (primary) hypertension: Secondary | ICD-10-CM | POA: Insufficient documentation

## 2014-11-28 DIAGNOSIS — I4891 Unspecified atrial fibrillation: Secondary | ICD-10-CM | POA: Diagnosis not present

## 2014-11-28 DIAGNOSIS — Z79899 Other long term (current) drug therapy: Secondary | ICD-10-CM | POA: Insufficient documentation

## 2014-11-28 HISTORY — PX: COLONOSCOPY WITH PROPOFOL: SHX5780

## 2014-11-28 LAB — GLUCOSE, CAPILLARY: GLUCOSE-CAPILLARY: 129 mg/dL — AB (ref 65–99)

## 2014-11-28 SURGERY — COLONOSCOPY WITH PROPOFOL
Anesthesia: Monitor Anesthesia Care

## 2014-11-28 MED ORDER — MIDAZOLAM HCL 5 MG/5ML IJ SOLN
INTRAMUSCULAR | Status: DC | PRN
  Administered 2014-11-28: 0.5 mg via INTRAVENOUS

## 2014-11-28 MED ORDER — SODIUM CHLORIDE 0.9 % IV SOLN
2.0000 g | Freq: Once | INTRAVENOUS | Status: AC
  Administered 2014-11-28: 2 g via INTRAVENOUS
  Filled 2014-11-28: qty 2000

## 2014-11-28 MED ORDER — SODIUM CHLORIDE 0.9 % IV SOLN: INTRAVENOUS | Status: DC

## 2014-11-28 MED ORDER — LIDOCAINE HCL (CARDIAC) 20 MG/ML IV SOLN
INTRAVENOUS | Status: AC
  Filled 2014-11-28: qty 5

## 2014-11-28 MED ORDER — LACTATED RINGERS IV SOLN
INTRAVENOUS | Status: DC | PRN
  Administered 2014-11-28: 09:00:00 via INTRAVENOUS

## 2014-11-28 MED ORDER — MIDAZOLAM HCL 2 MG/2ML IJ SOLN
INTRAMUSCULAR | Status: AC
  Filled 2014-11-28: qty 4

## 2014-11-28 MED ORDER — PROPOFOL 10 MG/ML IV BOLUS
INTRAVENOUS | Status: DC | PRN
  Administered 2014-11-28: 20 mg via INTRAVENOUS
  Administered 2014-11-28 (×2): 10 mg via INTRAVENOUS
  Administered 2014-11-28: 20 mg via INTRAVENOUS
  Administered 2014-11-28 (×5): 10 mg via INTRAVENOUS

## 2014-11-28 MED ORDER — PROPOFOL 10 MG/ML IV BOLUS
INTRAVENOUS | Status: AC
  Filled 2014-11-28: qty 20

## 2014-11-28 SURGICAL SUPPLY — 22 items

## 2014-11-28 NOTE — Anesthesia Postprocedure Evaluation (Signed)
  Anesthesia Post-op Note  Patient: Rachel Santos  Procedure(s) Performed: Procedure(s) (LRB): COLONOSCOPY WITH PROPOFOL (N/A)  Patient Location: PACU  Anesthesia Type: MAC  Level of Consciousness: awake and alert   Airway and Oxygen Therapy: Patient Spontanous Breathing  Post-op Pain: mild  Post-op Assessment: Post-op Vital signs reviewed, Patient's Cardiovascular Status Stable, Respiratory Function Stable, Patent Airway and No signs of Nausea or vomiting  Last Vitals:  Filed Vitals:   11/28/14 1120  BP: 144/31  Pulse: 56  Temp:   Resp: 17    Post-op Vital Signs: stable   Complications: No apparent anesthesia complications

## 2014-11-28 NOTE — Transfer of Care (Signed)
Immediate Anesthesia Transfer of Care Note  Patient: Rachel Santos  Procedure(s) Performed: Procedure(s): COLONOSCOPY WITH PROPOFOL (N/A)  Patient Location: PACU and Endoscopy Unit  Anesthesia Type:MAC  Level of Consciousness: awake, oriented and patient cooperative  Airway & Oxygen Therapy: Patient Spontanous Breathing and Patient connected to face mask oxygen  Post-op Assessment: Report given to RN and Post -op Vital signs reviewed and stable  Post vital signs: Reviewed and stable  Last Vitals:  Filed Vitals:   11/28/14 0856  BP: 121/48  Pulse: 57  Temp: 36.9 C  Resp: 14    Complications: No apparent anesthesia complications

## 2014-11-28 NOTE — Anesthesia Preprocedure Evaluation (Signed)
Anesthesia Evaluation  Patient identified by MRN, date of birth, ID band Patient awake    Reviewed: Allergy & Precautions, NPO status , Patient's Chart, lab work & pertinent test results  History of Anesthesia Complications (+) PONV  Airway Mallampati: II  TM Distance: >3 FB Neck ROM: Full    Dental no notable dental hx. (+) Edentulous Upper, Edentulous Lower, Upper Dentures, Lower Dentures   Pulmonary neg pulmonary ROS,  breath sounds clear to auscultation  Pulmonary exam normal       Cardiovascular hypertension, Pt. on medications Normal cardiovascular exam+ dysrhythmias Atrial Fibrillation Rhythm:Regular Rate:Normal  S/p MVR   Neuro/Psych TIAnegative psych ROS   GI/Hepatic negative GI ROS, Neg liver ROS,   Endo/Other  diabetes, Type 2  Renal/GU negative Renal ROS  negative genitourinary   Musculoskeletal negative musculoskeletal ROS (+)   Abdominal   Peds negative pediatric ROS (+)  Hematology negative hematology ROS (+)   Anesthesia Other Findings   Reproductive/Obstetrics negative OB ROS                             Anesthesia Physical Anesthesia Plan  ASA: III  Anesthesia Plan: MAC   Post-op Pain Management:    Induction:   Airway Management Planned: Simple Face Mask  Additional Equipment:   Intra-op Plan:   Post-operative Plan:   Informed Consent: I have reviewed the patients History and Physical, chart, labs and discussed the procedure including the risks, benefits and alternatives for the proposed anesthesia with the patient or authorized representative who has indicated his/her understanding and acceptance.   Dental advisory given  Plan Discussed with: CRNA  Anesthesia Plan Comments:         Anesthesia Quick Evaluation

## 2014-11-28 NOTE — Discharge Instructions (Signed)
Colonoscopy, Care After °These instructions give you information on caring for yourself after your procedure. Your doctor may also give you more specific instructions. Call your doctor if you have any problems or questions after your procedure. °HOME CARE °· Do not drive for 24 hours. °· Do not sign important papers or use machinery for 24 hours. °· You may shower. °· You may go back to your usual activities, but go slower for the first 24 hours. °· Take rest breaks often during the first 24 hours. °· Walk around or use warm packs on your belly (abdomen) if you have belly cramping or gas. °· Drink enough fluids to keep your pee (urine) clear or pale yellow. °· Resume your normal diet. Avoid heavy or fried foods. °· Avoid drinking alcohol for 24 hours or as told by your doctor. °· Only take medicines as told by your doctor. °If a tissue sample (biopsy) was taken during the procedure:  °· Do not take aspirin or blood thinners for 7 days, or as told by your doctor. °· Do not drink alcohol for 7 days, or as told by your doctor. °· Eat soft foods for the first 24 hours. °GET HELP IF: °You still have a small amount of blood in your poop (stool) 2-3 days after the procedure. °GET HELP RIGHT AWAY IF: °· You have more than a small amount of blood in your poop. °· You see clumps of tissue (blood clots) in your poop. °· Your belly is puffy (swollen). °· You feel sick to your stomach (nauseous) or throw up (vomit). °· You have a fever. °· You have belly pain that gets worse and medicine does not help. °MAKE SURE YOU: °· Understand these instructions. °· Will watch your condition. °· Will get help right away if you are not doing well or get worse. °Document Released: 04/16/2010 Document Revised: 03/19/2013 Document Reviewed: 11/19/2012 °ExitCare® Patient Information ©2015 [employer]. This information is not intended to replace advice given to you by your health care provider. Make sure you discuss any questions you have with  your health care provider. ° °

## 2014-11-28 NOTE — Op Note (Signed)
[facility] [place], [address]   COLONOSCOPY PROCEDURE REPORT  PATIENT: Rachel, Santos  MR#: [id number] BIRTHDATE: 24-Aug-1940 , 73  yrs. old GENDER: female ENDOSCOPIST: Carol Ada, MD REFERRED BY: PROCEDURE DATE:  12/27/14 PROCEDURE:   Colonoscopy with snare polypectomy ASA CLASS:   Class III INDICATIONS: Positive Cologard MEDICATIONS: Monitored anesthesia care and Ampicillin 2 grams IV  DESCRIPTION OF PROCEDURE:   After the risks and benefits and of the procedure were explained, informed consent was obtained.  revealed no abnormalities of the rectum.    The Pentax Ped Colon [id number] endoscope was introduced through the anus and advanced to the terminal ileum which was intubated for a short distance .  The quality of the prep was excellent. .  The instrument was then slowly withdrawn as the colon was fully examined. Estimated blood loss is zero unless otherwise noted in this procedure report.   FINDINGS: A 3 mm sessile ascending colon polyp was identified ans removed with a cold snare.  A couple of sigmoid diverticula were identified.     Retroflexed views revealed no abnormalities. The scope was then withdrawn from the patient and the procedure completed.  WITHDRAWAL TIME: 15 minutes  COMPLICATIONS: There were no immediate complications. ENDOSCOPIC IMPRESSION: 1) Ascending colon polyp. 2) Sigmoid colon diverticula.  RECOMMENDATIONS: 1) Follow up biopsies. 2) Restart anticoagulation. 3) Given the patient's age, current findings, and comorbidities, I do not recommend a follow up colonoscopy.  REPEAT EXAM:  cc:  _______________________________ eSignedCarol Ada, MD 12-27-2014 10:49 AM   CPT CODES: ICD CODES:  The ICD and CPT codes recommended by this software are interpretations from the data that the clinical staff has captured with the software.  The verification of the translation of this report to the ICD and CPT  codes and modifiers is the sole responsibility of the health care institution and practicing physician where this report was generated.  [place]. will not be held responsible for the validity of the ICD and CPT codes included on this report.  AMA assumes no liability for data contained or not contained herein. CPT is a Designer, television/film set of the [employer].   PATIENT NAME:  Rachel, Santos MR#: [id number]

## 2014-11-30 ENCOUNTER — Encounter (HOSPITAL_COMMUNITY): Payer: Self-pay | Admitting: Gastroenterology

## 2014-12-04 DIAGNOSIS — Z7901 Long term (current) use of anticoagulants: Secondary | ICD-10-CM | POA: Diagnosis not present

## 2014-12-04 DIAGNOSIS — I4891 Unspecified atrial fibrillation: Secondary | ICD-10-CM | POA: Diagnosis not present

## 2014-12-09 DIAGNOSIS — I4891 Unspecified atrial fibrillation: Secondary | ICD-10-CM | POA: Diagnosis not present

## 2014-12-09 DIAGNOSIS — Z7901 Long term (current) use of anticoagulants: Secondary | ICD-10-CM | POA: Diagnosis not present

## 2014-12-25 DIAGNOSIS — Z23 Encounter for immunization: Secondary | ICD-10-CM | POA: Diagnosis not present

## 2014-12-25 DIAGNOSIS — Z7901 Long term (current) use of anticoagulants: Secondary | ICD-10-CM | POA: Diagnosis not present

## 2014-12-25 DIAGNOSIS — I4891 Unspecified atrial fibrillation: Secondary | ICD-10-CM | POA: Diagnosis not present

## 2015-01-06 ENCOUNTER — Encounter: Payer: Self-pay | Admitting: Cardiovascular Disease

## 2015-01-06 ENCOUNTER — Ambulatory Visit (INDEPENDENT_AMBULATORY_CARE_PROVIDER_SITE_OTHER): Payer: Medicare Other | Admitting: Cardiovascular Disease

## 2015-01-06 VITALS — BP 108/60 | HR 73 | Ht 66.5 in | Wt 157.0 lb

## 2015-01-06 DIAGNOSIS — I071 Rheumatic tricuspid insufficiency: Secondary | ICD-10-CM | POA: Diagnosis not present

## 2015-01-06 DIAGNOSIS — I2510 Atherosclerotic heart disease of native coronary artery without angina pectoris: Secondary | ICD-10-CM | POA: Diagnosis not present

## 2015-01-06 DIAGNOSIS — I480 Paroxysmal atrial fibrillation: Secondary | ICD-10-CM

## 2015-01-06 DIAGNOSIS — I340 Nonrheumatic mitral (valve) insufficiency: Secondary | ICD-10-CM | POA: Diagnosis not present

## 2015-01-06 NOTE — Patient Instructions (Signed)
Medication Instructions:  Your physician recommends that you continue on your current medications as directed. Please refer to the Current Medication list given to you today.   Labwork: none  Testing/Procedures: Your physician has requested that you have an echocardiogram. Echocardiography is a painless test that uses sound waves to create images of your heart. It provides your doctor with information about the size and shape of your heart and how well your heart's chambers and valves are working. This procedure takes approximately one hour. There are no restrictions for this procedure. To be done in 6 months--week or 2 prior to appointment with Dr. Angelena Form    Follow-Up: Your physician wants you to follow-up in: 6 months.  You will receive a reminder letter in the mail two months in advance. If you don't receive a letter, please call our office to schedule the follow-up appointment.   Any Other Special Instructions Will Be Listed Below (If Applicable).

## 2015-01-06 NOTE — Progress Notes (Signed)
Chief Complaint  Patient presents with  . Bleeding/Bruising    History of Present Illness: 74 yo female with history of DM, HTN, OA, atrial fibrillation, GERD, cataracts, who is here today for cardiac follow up. I saw her as a new patient for evaluation of atrial fibrillation and severe MR on 07/03/12. She was diagnosed with atrial fibrillation on 06/27/12 in primary care and was started on Metoprolol 50 mg po BID for rate control and Xarelto 20 mg po Qdaily. Echo with normal LV size and function severe MR, moderate TR. She had a TEE guided cardioversion on Jul 31, 2012 with return to sinus rhythm. Her MR was noted to be central and severe. The LV cavity was not dilated. LV systolic function was normal. There was moderate to severe TR as well. She converted to NSR. She was felt to be symptomatic from her mitral valve disease. Cardiac cath on 09/05/12 with mild CAD. She underwent placement of mitral valve ring and tricuspid valve ring as well as MAZE procedure per Dr. Roxy Manns on 10/11/12. Post-operative course complicated by recurrence of atrial fibrillation with bradycardia. She was seen by Dr. Caryl Comes as an EP consult and pacemaker not recommended. She was discharged home on amiodarone and coumadin. She was discharged home on Lasix for mild volume overload but this was stopped after discharge via phone messages due to dizziness and hypotension.  Echo 12/04/12 with normal LV size and function, MR and TV repair intact. Her amiodarone has been stopped. Carotid artery dopplers June 2015 with mild bilateral disease.   She is here today for follow up. She is feeling well. No chest pain or SOB. No palpitations. Limited mostly by knees.   Primary Care Physician: Dr. Ashby Dawes [facility])   Past Medical History  Diagnosis Date  . Diabetes mellitus   . Hypertension   . Atrial fibrillation ([place])   . Mitral regurgitation   . GERD (gastroesophageal reflux disease)   . Cataracts, bilateral   .  PONV (postoperative nausea and vomiting)   . Tricuspid regurgitation   . Shortness of breath     with activity  . Stroke [facility])     mini stroke with vision problems  . 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 S/P Maze operation for atrial fibrillation 10/11/2012    Complete bilateral atrial lesion set using bipolar radiofrequency and cryothermy ablation with clipping of LA appendage  . Arthritis     knees    Past Surgical History  Procedure Laterality Date  . Mastectomy Left 1982  . Cesarean section      x 2  . Tee without cardioversion N/A 07/31/2012    Procedure: TRANSESOPHAGEAL ECHOCARDIOGRAM (TEE);  Surgeon: Josue Hector, MD;  Location: [place];  Service: Cardiovascular;  Laterality: N/A;  . Cardioversion N/A 07/31/2012    Procedure: CARDIOVERSION;  Surgeon: Josue Hector, MD;  Location[facility] ENDOSCOPY;  Service: Cardiovascular;  Laterality: N/A;  . Tubal ligation    . Cardiac catheterization  09-05-12  . Tricuspid valve replacement N/A 10/11/2012    Procedure: TRICUSPID VALVE REPAIR;  Surgeon: Rexene Alberts, MD;  Location: [place];  Service: Open Heart Surgery;  Laterality: N/A;  . Maze N/A 10/11/2012    Procedure: MAZE;  Surgeon: Rexene Alberts, MD;  Location: [place];  Service: Open Heart Surgery;  Laterality: N/A;  . Intraoperative transesophageal echocardiogram N/A 10/11/2012    Procedure: INTRAOPERATIVE TRANSESOPHAGEAL ECHOCARDIOGRAM;  Surgeon: Rexene Alberts, MD;  Location: [place];  Service: Open Heart Surgery;  Laterality: N/A;  . Mitral valve repair N/A 10/11/2012    Procedure: MITRAL VALVE REPAIR (MVR);  Surgeon: Rexene Alberts, MD;  Location: [place];  Service: Open Heart Surgery;  Laterality: N/A;  . Colonoscopy with propofol N/A 11/28/2014    Procedure: COLONOSCOPY WITH PROPOFOL;  Surgeon: Carol Ada, MD;   Location: [facility] ENDOSCOPY;  Service: Endoscopy;  Laterality: N/A;    Current Outpatient Prescriptions  Medication Sig Dispense Refill  . acetaminophen (TYLENOL) 325 MG tablet Take 650 mg by mouth every 6 (six) hours as needed for moderate pain or headache.    Marland Kitchen amLODipine (NORVASC) 10 MG tablet TAKE 1 TABLET BY MOUTH DAILY. 30 tablet 5  . calcium carbonate (OS-CAL) 600 MG TABS tablet Take 600 mg by mouth 2 (two) times daily with a meal.     . glipiZIDE (GLUCOTROL XL) 10 MG 24 hr tablet Take 10 mg by mouth daily with breakfast.    . insulin lispro (HUMALOG KWIKPEN) 100 UNIT/ML SOPN Inject 10-15 Units into the skin daily before supper. Sliding scale    . LEVEMIR FLEXPEN 100 UNIT/ML injection Inject 10-28 Units into the skin 2 (two) times daily. 28 units AM, 10 units pm    . losartan (COZAAR) 100 MG tablet Take 1 tablet (100 mg total) by mouth daily. 30 tablet 1  . lovastatin (MEVACOR) 40 MG tablet TAKE 1 TABLET BY MOUTH DAILY AT BEDTIME 30 tablet 5  . Multiple Vitamin (MULTIVITAMIN) tablet Take 1 tablet by mouth every morning.     Marland Kitchen omeprazole (PRILOSEC) 40 MG capsule Take 40 mg by mouth every morning.     . ONE TOUCH ULTRA TEST test strip 1 each by Other route 3 (three) times daily.   2  . venlafaxine XR (EFFEXOR-XR) 150 MG 24 hr capsule Take 150 mg by mouth every morning.     . warfarin (COUMADIN) 3 MG tablet Take 1 tablet (3 mg total) by mouth daily at 6 PM. Or as directed. (Patient taking differently: Take 4.5-6 mg by mouth daily at 6 PM. Saturday, Sunday, Tuesday, and Thursday 6mg . Monday, Wednesday,and Friday 4.5mg ) 30 tablet 1   No current facility-administered medications for this visit.    Allergies  Allergen Reactions  . Ultram [Tramadol] Nausea And Vomiting    Social History   Social History  . Marital Status: Widowed    Spouse Name: N/A  . Number of Children: 3  . Years of Education: N/A   Occupational History  . Retired-[employer]    Social History  Main Topics  . Smoking status: Never Smoker   . Smokeless tobacco: Never Used  . Alcohol Use: No  . Drug Use: No  . Sexual Activity: No   Other Topics Concern  . Not on file   Social History Narrative    Family History  Problem Relation Age of Onset  . Cancer Father     prostate  . Heart attack Paternal Grandfather   . Hypertension Father   . Stroke Neg Hx     Review of Systems:  As stated in the HPI and otherwise negative.   BP 108/60 mmHg  Pulse 73  Ht 5' 6.5" (1.689 m)  Wt 157 lb (71.215 kg)  BMI 24.96 kg/m2  SpO2 95%  Physical Examination:  General: Well developed, well nourished, NAD HEENT: OP clear, mucus membranes moist SKIN: warm, dry. No rashes. Neuro: No focal deficits Musculoskeletal: Muscle strength 5/5 all ext Psychiatric: Mood and affect normal Neck: No JVD, no carotid bruits, no thyromegaly, no lymphadenopathy. Lungs:Clear bilaterally, no wheezes, rhonci, crackles Cardiovascular: Regular rate and rhythm. No murmurs, gallops or rubs. Abdomen:Soft. Bowel sounds present. Non-tender.  Extremities: No lower extremity edema. Pulses are 2 + in the bilateral DP/PT.  Echo 12/04/12: Left ventricle: The cavity size was normal. Wall thickness was increased in a pattern of mild LVH. Systolic function was normal. The estimated ejection fraction was in the range of 55% to 60%. Wall motion was normal; there were no regional wall motion abnormalities. Left ventricular diastolic function parameters were normal. - Mitral valve: Prior procedures included surgical repair. - Left atrium: The atrium was moderately dilated. - Atrial septum: No defect or patent foramen ovale was identified. - Tricuspid valve: Prior procedures included surgical repair.  EKG:  EKG is not ordered today. The ekg ordered today demonstrates   Recent Labs: No results found for requested labs within last 365 days.   Lipid Panel No results found for: CHOL, TRIG, HDL, CHOLHDL, VLDL, LDLCALC,  LDLDIRECT   Wt Readings from Last 3 Encounters:  01/06/15 157 lb (71.215 kg)  11/28/14 176 lb (79.833 kg)  05/29/14 154 lb 3.2 oz (69.945 kg)     Other studies Reviewed: Additional studies/ records that were reviewed today include: . Review of the above records demonstrates:    Echo 12/04/12: Left ventricle: The cavity size was normal. Wall thickness was increased in a pattern of mild LVH. Systolic function was normal. The estimated ejection fraction was in the range of 55% to 60%. Wall motion was normal; there were no regional wall motion abnormalities. Left ventricular diastolic function parameters were normal. - Mitral valve: Prior procedures included surgical repair. - Left atrium: The atrium was moderately dilated. - Atrial septum: No defect or patent foramen ovale was identified. - Tricuspid valve: Prior procedures included surgical repair.  Assessment and Plan:   1. Atrial fibrillation, paroxysmal: Sinus today. Continue coumadin given her history of PAF and prior TIA.   2. Mitral regurgitation: s/p mitral valve ring July 2014. Repair stable by echo September 2014. Repeat echo April 2017.   3. Tricuspid regurgitation: s/p tricuspid ring July 2014. Repair stable by echo September 2014. Repeat echo April 2017.   4. CAD: Mild by cath 2014. Continue ASA and statin.   Current medicines are reviewed at length with the patient today.  The patient does not have concerns regarding medicines.  The following changes have been made:  no change  Labs/ tests ordered today include:   Orders Placed This Encounter  Procedures  . Echocardiogram    Disposition:   FU with me in 6  months  Signed, Lauree Chandler, MD 01/06/2015 5:14 PM    [facility] [address], [place], [place]  [address] Phone: [phone]; Fax: [phone]

## 2015-01-14 DIAGNOSIS — E1065 Type 1 diabetes mellitus with hyperglycemia: Secondary | ICD-10-CM | POA: Diagnosis not present

## 2015-01-21 DIAGNOSIS — I10 Essential (primary) hypertension: Secondary | ICD-10-CM | POA: Diagnosis not present

## 2015-01-21 DIAGNOSIS — E1065 Type 1 diabetes mellitus with hyperglycemia: Secondary | ICD-10-CM | POA: Diagnosis not present

## 2015-01-21 DIAGNOSIS — E782 Mixed hyperlipidemia: Secondary | ICD-10-CM | POA: Diagnosis not present

## 2015-01-21 DIAGNOSIS — I4891 Unspecified atrial fibrillation: Secondary | ICD-10-CM | POA: Diagnosis not present

## 2015-01-27 DIAGNOSIS — I4891 Unspecified atrial fibrillation: Secondary | ICD-10-CM | POA: Diagnosis not present

## 2015-01-27 DIAGNOSIS — Z7901 Long term (current) use of anticoagulants: Secondary | ICD-10-CM | POA: Diagnosis not present

## 2015-02-26 DIAGNOSIS — I4891 Unspecified atrial fibrillation: Secondary | ICD-10-CM | POA: Diagnosis not present

## 2015-02-26 DIAGNOSIS — Z7901 Long term (current) use of anticoagulants: Secondary | ICD-10-CM | POA: Diagnosis not present

## 2015-04-02 DIAGNOSIS — I4891 Unspecified atrial fibrillation: Secondary | ICD-10-CM | POA: Diagnosis not present

## 2015-04-02 DIAGNOSIS — Z7901 Long term (current) use of anticoagulants: Secondary | ICD-10-CM | POA: Diagnosis not present

## 2015-04-14 DIAGNOSIS — J22 Unspecified acute lower respiratory infection: Secondary | ICD-10-CM | POA: Diagnosis not present

## 2015-04-14 DIAGNOSIS — R05 Cough: Secondary | ICD-10-CM | POA: Diagnosis not present

## 2015-04-14 DIAGNOSIS — R5383 Other fatigue: Secondary | ICD-10-CM | POA: Diagnosis not present

## 2015-04-27 DIAGNOSIS — J189 Pneumonia, unspecified organism: Secondary | ICD-10-CM | POA: Diagnosis not present

## 2015-04-30 DIAGNOSIS — Z7901 Long term (current) use of anticoagulants: Secondary | ICD-10-CM | POA: Diagnosis not present

## 2015-04-30 DIAGNOSIS — I4891 Unspecified atrial fibrillation: Secondary | ICD-10-CM | POA: Diagnosis not present

## 2015-05-12 DIAGNOSIS — E1065 Type 1 diabetes mellitus with hyperglycemia: Secondary | ICD-10-CM | POA: Diagnosis not present

## 2015-05-12 DIAGNOSIS — I10 Essential (primary) hypertension: Secondary | ICD-10-CM | POA: Diagnosis not present

## 2015-05-12 DIAGNOSIS — E782 Mixed hyperlipidemia: Secondary | ICD-10-CM | POA: Diagnosis not present

## 2015-05-12 DIAGNOSIS — I4891 Unspecified atrial fibrillation: Secondary | ICD-10-CM | POA: Diagnosis not present

## 2015-05-19 DIAGNOSIS — I4891 Unspecified atrial fibrillation: Secondary | ICD-10-CM | POA: Diagnosis not present

## 2015-05-19 DIAGNOSIS — E1065 Type 1 diabetes mellitus with hyperglycemia: Secondary | ICD-10-CM | POA: Diagnosis not present

## 2015-05-19 DIAGNOSIS — E782 Mixed hyperlipidemia: Secondary | ICD-10-CM | POA: Diagnosis not present

## 2015-05-19 DIAGNOSIS — I10 Essential (primary) hypertension: Secondary | ICD-10-CM | POA: Diagnosis not present

## 2015-05-20 DIAGNOSIS — H524 Presbyopia: Secondary | ICD-10-CM | POA: Diagnosis not present

## 2015-05-20 DIAGNOSIS — H25813 Combined forms of age-related cataract, bilateral: Secondary | ICD-10-CM | POA: Diagnosis not present

## 2015-05-20 DIAGNOSIS — E119 Type 2 diabetes mellitus without complications: Secondary | ICD-10-CM | POA: Diagnosis not present

## 2015-06-01 DIAGNOSIS — H2511 Age-related nuclear cataract, right eye: Secondary | ICD-10-CM | POA: Diagnosis not present

## 2015-06-02 DIAGNOSIS — I4891 Unspecified atrial fibrillation: Secondary | ICD-10-CM | POA: Diagnosis not present

## 2015-06-02 DIAGNOSIS — Z7901 Long term (current) use of anticoagulants: Secondary | ICD-10-CM | POA: Diagnosis not present

## 2015-06-30 DIAGNOSIS — I4891 Unspecified atrial fibrillation: Secondary | ICD-10-CM | POA: Diagnosis not present

## 2015-06-30 DIAGNOSIS — Z7901 Long term (current) use of anticoagulants: Secondary | ICD-10-CM | POA: Diagnosis not present

## 2015-07-01 DIAGNOSIS — H25811 Combined forms of age-related cataract, right eye: Secondary | ICD-10-CM | POA: Diagnosis not present

## 2015-07-01 DIAGNOSIS — H2511 Age-related nuclear cataract, right eye: Secondary | ICD-10-CM | POA: Diagnosis not present

## 2015-07-08 DIAGNOSIS — H2512 Age-related nuclear cataract, left eye: Secondary | ICD-10-CM | POA: Diagnosis not present

## 2015-07-29 DIAGNOSIS — H25812 Combined forms of age-related cataract, left eye: Secondary | ICD-10-CM | POA: Diagnosis not present

## 2015-07-29 DIAGNOSIS — H2512 Age-related nuclear cataract, left eye: Secondary | ICD-10-CM | POA: Diagnosis not present

## 2015-07-31 ENCOUNTER — Other Ambulatory Visit (HOSPITAL_COMMUNITY): Payer: Medicare Other

## 2015-07-31 ENCOUNTER — Ambulatory Visit: Payer: Medicare Other | Admitting: Cardiovascular Disease

## 2015-08-13 DIAGNOSIS — Z7901 Long term (current) use of anticoagulants: Secondary | ICD-10-CM | POA: Diagnosis not present

## 2015-08-13 DIAGNOSIS — I4891 Unspecified atrial fibrillation: Secondary | ICD-10-CM | POA: Diagnosis not present

## 2015-09-02 DIAGNOSIS — E1065 Type 1 diabetes mellitus with hyperglycemia: Secondary | ICD-10-CM | POA: Diagnosis not present

## 2015-09-02 DIAGNOSIS — E782 Mixed hyperlipidemia: Secondary | ICD-10-CM | POA: Diagnosis not present

## 2015-09-02 DIAGNOSIS — C50912 Malignant neoplasm of unspecified site of left female breast: Secondary | ICD-10-CM | POA: Diagnosis not present

## 2015-09-02 DIAGNOSIS — I4891 Unspecified atrial fibrillation: Secondary | ICD-10-CM | POA: Diagnosis not present

## 2015-09-08 ENCOUNTER — Telehealth: Payer: Self-pay | Admitting: Cardiovascular Disease

## 2015-09-08 NOTE — Telephone Encounter (Signed)
New Message  Pt daughter calling stating that pt thinks she needs to have a blood transfusion. Pt daughter requested to speak with nurse to discuss. Please call back to discuss

## 2015-09-08 NOTE — Telephone Encounter (Signed)
I was unable to find documentation that we can speak with Rachel Santos's daughter.  I placed call to Rachel Santos and told her that her daughter had contacted our office about Rachel Santos possibly needing a blood transfusion.  Rachel Santos reports she is feeling well and does not know why daughter called.  She told me I did not need to return call to her daughter.  Rachel Santos aware of appointments on 6/30 for echo and with Dr. Angelena Form. I asked Rachel Santos to complete a DPR form when here for this appt.

## 2015-09-09 DIAGNOSIS — M25569 Pain in unspecified knee: Secondary | ICD-10-CM | POA: Diagnosis not present

## 2015-09-09 DIAGNOSIS — E782 Mixed hyperlipidemia: Secondary | ICD-10-CM | POA: Diagnosis not present

## 2015-09-09 DIAGNOSIS — E1065 Type 1 diabetes mellitus with hyperglycemia: Secondary | ICD-10-CM | POA: Diagnosis not present

## 2015-09-09 DIAGNOSIS — S46001A Unspecified injury of muscle(s) and tendon(s) of the rotator cuff of right shoulder, initial encounter: Secondary | ICD-10-CM | POA: Diagnosis not present

## 2015-09-14 DIAGNOSIS — Z7901 Long term (current) use of anticoagulants: Secondary | ICD-10-CM | POA: Diagnosis not present

## 2015-09-14 DIAGNOSIS — I4891 Unspecified atrial fibrillation: Secondary | ICD-10-CM | POA: Diagnosis not present

## 2015-09-25 ENCOUNTER — Ambulatory Visit (INDEPENDENT_AMBULATORY_CARE_PROVIDER_SITE_OTHER): Payer: Medicare Other | Admitting: Cardiovascular Disease

## 2015-09-25 ENCOUNTER — Encounter: Payer: Self-pay | Admitting: Cardiovascular Disease

## 2015-09-25 ENCOUNTER — Other Ambulatory Visit: Payer: Self-pay

## 2015-09-25 ENCOUNTER — Encounter: Payer: Self-pay | Admitting: *Deleted

## 2015-09-25 ENCOUNTER — Ambulatory Visit (HOSPITAL_COMMUNITY): Payer: Medicare Other | Attending: Cardiology

## 2015-09-25 VITALS — BP 120/70 | HR 68 | Ht 67.0 in | Wt 165.2 lb

## 2015-09-25 DIAGNOSIS — Z8249 Family history of ischemic heart disease and other diseases of the circulatory system: Secondary | ICD-10-CM | POA: Diagnosis not present

## 2015-09-25 DIAGNOSIS — I071 Rheumatic tricuspid insufficiency: Secondary | ICD-10-CM | POA: Diagnosis not present

## 2015-09-25 DIAGNOSIS — I059 Rheumatic mitral valve disease, unspecified: Secondary | ICD-10-CM | POA: Diagnosis not present

## 2015-09-25 DIAGNOSIS — I480 Paroxysmal atrial fibrillation: Secondary | ICD-10-CM

## 2015-09-25 DIAGNOSIS — I2510 Atherosclerotic heart disease of native coronary artery without angina pectoris: Secondary | ICD-10-CM | POA: Diagnosis not present

## 2015-09-25 DIAGNOSIS — E119 Type 2 diabetes mellitus without complications: Secondary | ICD-10-CM | POA: Diagnosis not present

## 2015-09-25 DIAGNOSIS — I340 Nonrheumatic mitral (valve) insufficiency: Secondary | ICD-10-CM | POA: Diagnosis not present

## 2015-09-25 DIAGNOSIS — I350 Nonrheumatic aortic (valve) stenosis: Secondary | ICD-10-CM

## 2015-09-25 DIAGNOSIS — Z87891 Personal history of nicotine dependence: Secondary | ICD-10-CM | POA: Insufficient documentation

## 2015-09-25 DIAGNOSIS — I119 Hypertensive heart disease without heart failure: Secondary | ICD-10-CM | POA: Insufficient documentation

## 2015-09-25 NOTE — Patient Instructions (Addendum)
Medication Instructions:  Your physician recommends that you continue on your current medications as directed. Please refer to the Current Medication list given to you today.   Labwork: None   Testing/Procedures: Your physician has requested that you have an echocardiogram. Echocardiography is a painless test that uses sound waves to create images of your heart. It provides your doctor with information about the size and shape of your heart and how well your heart's chambers and valves are working. This procedure takes approximately one hour. There are no restrictions for this procedure.  IN ONE YEAR     Follow-Up: Your physician wants you to follow-up in: 1 year with Dr Angelena Form. (June 2018).  You will receive a reminder letter in the mail two months in advance. If you don't receive a letter, please call our office to schedule the follow-up appointment.        If you need a refill on your cardiac medications before your next appointment, please call your pharmacy.

## 2015-09-25 NOTE — Progress Notes (Signed)
Chief Complaint  Patient presents with  . Mitral Regurgitation  . Atrial Fibrillation    History of Present Illness: 75 yo female with history of DM, HTN, OA, atrial fibrillation, GERD, cataracts, who is here today for cardiac follow up. I saw her as a new patient for evaluation of atrial fibrillation and severe MR on 07/03/12. She was diagnosed with atrial fibrillation on 06/27/12 in primary care and was started on Metoprolol 50 mg po BID for rate control and Xarelto 20 mg po Qdaily. Echo with normal LV size and function, severe MR, moderate TR. She had a TEE guided cardioversion on Jul 31, 2012 with return to sinus rhythm. Her MR was noted to be central and severe. The LV cavity was not dilated. LV systolic function was normal. There was moderate to severe TR as well. She converted to NSR. She was felt to be symptomatic from her mitral valve disease. Cardiac cath on 09/05/12 with mild CAD. She underwent placement of mitral valve ring and tricuspid valve ring as well as MAZE procedure per Dr. Roxy Manns on 10/11/12. Post-operative course complicated by recurrence of atrial fibrillation with bradycardia. She was seen by Dr. Caryl Comes as an EP consult and pacemaker not recommended. She was discharged home on amiodarone and coumadin. She was discharged home on Lasix for mild volume overload but this was stopped after discharge via phone messages due to dizziness and hypotension.  Echo 12/04/12 with normal LV size and function, MR and TV repair intact. Her amiodarone has been stopped. Carotid artery dopplers June 2015 with mild bilateral disease.   She is here today for follow up. She is feeling well. No chest pain or SOB. No palpitations. Limited mostly by pain in her legs.   Primary Care Physician: Merrilee Seashore, MD   Past Medical History  Diagnosis Date  . Diabetes mellitus   . Hypertension   . Atrial fibrillation ([place])   . Mitral regurgitation   . GERD (gastroesophageal reflux disease)   . Cataracts,  bilateral   . PONV (postoperative nausea and vomiting)   . Tricuspid regurgitation   . Shortness of breath     with activity  . Stroke [facility])     mini stroke with vision problems  . 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 S/P Maze operation for atrial fibrillation 10/11/2012    Complete bilateral atrial lesion set using bipolar radiofrequency and cryothermy ablation with clipping of LA appendage  . Arthritis     knees    Past Surgical History  Procedure Laterality Date  . Mastectomy Left 1982  . Cesarean section      x 2  . Tee without cardioversion N/A 07/31/2012    Procedure: TRANSESOPHAGEAL ECHOCARDIOGRAM (TEE);  Surgeon: Josue Hector, MD;  Location: [place];  Service: Cardiovascular;  Laterality: N/A;  . Cardioversion N/A 07/31/2012    Procedure: CARDIOVERSION;  Surgeon: Josue Hector, MD;  Location: [facility] ENDOSCOPY;  Service: Cardiovascular;  Laterality: N/A;  . Tubal ligation    . Cardiac catheterization  09-05-12  . Tricuspid valve replacement N/A 10/11/2012    Procedure: TRICUSPID VALVE REPAIR;  Surgeon: Rexene Alberts, MD;  Location: [place];  Service: Open Heart Surgery;  Laterality: N/A;  . Maze N/A 10/11/2012    Procedure: MAZE;  Surgeon: Rexene Alberts,  MD;  Location: [facility] OR;  Service: Open Heart Surgery;  Laterality: N/A;  . Intraoperative transesophageal echocardiogram N/A 10/11/2012    Procedure: INTRAOPERATIVE TRANSESOPHAGEAL ECHOCARDIOGRAM;  Surgeon: Rexene Alberts, MD;  Location: [place];  Service: Open Heart Surgery;  Laterality: N/A;  . Mitral valve repair N/A 10/11/2012    Procedure: MITRAL VALVE REPAIR (MVR);  Surgeon: Rexene Alberts, MD;  Location: [place];  Service: Open Heart Surgery;  Laterality: N/A;  . Colonoscopy with propofol N/A 11/28/2014    Procedure: COLONOSCOPY WITH PROPOFOL;  Surgeon:  Carol Ada, MD;  Location: [facility] ENDOSCOPY;  Service: Endoscopy;  Laterality: N/A;    Current Outpatient Prescriptions  Medication Sig Dispense Refill  . acetaminophen (TYLENOL) 325 MG tablet Take 650 mg by mouth every 6 (six) hours as needed for moderate pain or headache.    Marland Kitchen amLODipine (NORVASC) 10 MG tablet TAKE 1 TABLET BY MOUTH DAILY. 30 tablet 5  . calcium carbonate (OS-CAL) 600 MG TABS tablet Take 600 mg by mouth 2 (two) times daily with a meal.     . glipiZIDE (GLUCOTROL XL) 10 MG 24 hr tablet Take 10 mg by mouth daily with breakfast.    . Insulin Detemir (LEVEMIR FLEXPEN) 100 UNIT/ML Pen Inject 20-40 Units into the skin 2 (two) times daily. 40 units in the morning and 20 units at night    . insulin lispro (HUMALOG KWIKPEN) 100 UNIT/ML SOPN Inject 10-15 Units into the skin daily before supper. Sliding scale    . losartan (COZAAR) 100 MG tablet Take 1 tablet (100 mg total) by mouth daily. 30 tablet 1  . lovastatin (MEVACOR) 40 MG tablet TAKE 1 TABLET BY MOUTH DAILY AT BEDTIME 30 tablet 5  . meloxicam (MOBIC) 7.5 MG tablet Take 7.5 mg by mouth 2 (two) times daily.  3  . Multiple Vitamin (MULTIVITAMIN) tablet Take 1 tablet by mouth every morning.     Marland Kitchen omeprazole (PRILOSEC) 40 MG capsule Take 40 mg by mouth every morning.     . ONE TOUCH ULTRA TEST test strip 1 each by Other route 3 (three) times daily.   2  . venlafaxine XR (EFFEXOR-XR) 150 MG 24 hr capsule Take 150 mg by mouth every morning.     . warfarin (COUMADIN) 3 MG tablet Take 3 mg by mouth as directed. Take by mouth as directed by the coumadin clinic     No current facility-administered medications for this visit.    Allergies  Allergen Reactions  . Ultram [Tramadol] Nausea And Vomiting    Social History   Social History  . Marital Status: Widowed    Spouse Name: N/A  . Number of Children: 3  . Years of Education: N/A   Occupational History  . Retired-[employer]    Social History Main Topics    . Smoking status: Never Smoker   . Smokeless tobacco: Never Used  . Alcohol Use: No  . Drug Use: No  . Sexual Activity: No   Other Topics Concern  . Not on file   Social History Narrative    Family History  Problem Relation Age of Onset  . Cancer Father     prostate  . Heart attack Paternal Grandfather   . Hypertension Father   . Stroke Neg Hx     Review of Systems:  As stated in the HPI and otherwise negative.   BP 120/70 mmHg  Pulse 68  Ht 5\' 7"  (1.702 m)  Wt 165 lb 3.2 oz (74.934  kg)  BMI 25.87 kg/m2  Physical Examination: General: Well developed, well nourished, NAD HEENT: OP clear, mucus membranes moist SKIN: warm, dry. No rashes. Neuro: No focal deficits Musculoskeletal: Muscle strength 5/5 all ext Psychiatric: Mood and affect normal Neck: No JVD, no carotid bruits, no thyromegaly, no lymphadenopathy. Lungs:Clear bilaterally, no wheezes, rhonci, crackles Cardiovascular: Regular rate and rhythm. Systolic murmur. No gallops or rubs. Abdomen:Soft. Bowel sounds present. Non-tender.  Extremities: No lower extremity edema. Pulses are 2 + in the bilateral DP/PT.  Echo 09/25/15:  Left ventricle: The cavity size was normal. Wall thickness was  normal. Systolic function was normal. The estimated ejection  fraction was in the range of 55% to 60%. Wall motion was normal;  there were no regional wall motion abnormalities. Doppler  parameters are consistent with high ventricular filling pressure. - Aortic valve: Valve mobility was restricted. There was mild to  moderate stenosis. Valve area (VTI): 1.38 cm^2. Valve area  (Vmax): 1.41 cm^2. Valve area (Vmean): 1.28 cm^2. - Mitral valve: Calcified annulus. Prior procedures included  surgical repair. The findings are consistent with mild stenosis.  Valve area by pressure half-time: 1.52 cm^2. Valve area by  continuity equation (using LVOT flow): 1.46 cm^2. - Left atrium: The atrium was moderately dilated. -  Tricuspid valve: Prior procedures included surgical repair.  Impressions:  - Normal LV systolic function; elevated LV filling pressure;  moderate LAE; calcified aortic valve with mild to moderate AS;  s/p MV repair with mild MS and trace MR; s/p TV repair with trace  TR.  EKG:  EKG is  ordered today. The ekg ordered today demonstrates sinus rhythm, rate 67 bpm, low amplitude p waves.   Recent Labs: No results found for requested labs within last 365 days.   Lipid Panel No results found for: CHOL, TRIG, HDL, CHOLHDL, VLDL, LDLCALC, LDLDIRECT   Wt Readings from Last 3 Encounters:  09/25/15 165 lb 3.2 oz (74.934 kg)  01/06/15 157 lb (71.215 kg)  11/28/14 176 lb (79.833 kg)     Other studies Reviewed: Additional studies/ records that were reviewed today include: . Review of the above records demonstrates:   Assessment and Plan:   1. Atrial fibrillation, paroxysmal: Sinus today. Continue coumadin given her history of PAF and prior TIA.   2. Mitral regurgitation: s/p mitral valve ring July 2014. Repair stable by echo September 2014. Repeat echo April 2017.   3. Tricuspid regurgitation: s/p tricuspid ring July 2014. Repair stable by echo September 2014. Repeat echo April 2017.   4. CAD: Mild by cath 2014. Continue ASA and statin.   5. Aortic stenosis: She has mild to moderate AS by echo today. Mean gradient 18 mm Hg. Will repeat echo one year.   Current medicines are reviewed at length with the patient today.  The patient does not have concerns regarding medicines.  The following changes have been made:  no change  Labs/ tests ordered today include:   Orders Placed This Encounter  Procedures  . EKG 12-Lead  . ECHO COMPLETE    Disposition:   FU with me in 6  months  Signed, Lauree Chandler, MD 09/25/2015 1:04 PM    [facility] [address], [place], [place]  [address] Phone: [phone]; Fax: [phone]

## 2015-10-15 DIAGNOSIS — I4891 Unspecified atrial fibrillation: Secondary | ICD-10-CM | POA: Diagnosis not present

## 2015-10-15 DIAGNOSIS — Z7901 Long term (current) use of anticoagulants: Secondary | ICD-10-CM | POA: Diagnosis not present

## 2015-11-19 DIAGNOSIS — I4891 Unspecified atrial fibrillation: Secondary | ICD-10-CM | POA: Diagnosis not present

## 2015-11-19 DIAGNOSIS — Z7901 Long term (current) use of anticoagulants: Secondary | ICD-10-CM | POA: Diagnosis not present

## 2015-12-16 DIAGNOSIS — K625 Hemorrhage of anus and rectum: Secondary | ICD-10-CM | POA: Diagnosis not present

## 2015-12-16 DIAGNOSIS — R1084 Generalized abdominal pain: Secondary | ICD-10-CM | POA: Diagnosis not present

## 2015-12-16 DIAGNOSIS — K5730 Diverticulosis of large intestine without perforation or abscess without bleeding: Secondary | ICD-10-CM | POA: Diagnosis not present

## 2015-12-17 ENCOUNTER — Other Ambulatory Visit: Payer: Self-pay | Admitting: Gastroenterology

## 2015-12-17 DIAGNOSIS — Z7901 Long term (current) use of anticoagulants: Secondary | ICD-10-CM | POA: Diagnosis not present

## 2015-12-17 DIAGNOSIS — I4891 Unspecified atrial fibrillation: Secondary | ICD-10-CM | POA: Diagnosis not present

## 2015-12-18 ENCOUNTER — Encounter (HOSPITAL_COMMUNITY)
Admission: RE | Disposition: A | Discharge status: Home / Self Care | Payer: Self-pay | Source: Ambulatory Visit | Attending: Gastroenterology

## 2015-12-18 ENCOUNTER — Ambulatory Visit (HOSPITAL_COMMUNITY)
Admission: RE | Admit: 2015-12-18 | Discharge: 2015-12-18 | Disposition: A | Discharge status: Home / Self Care | Payer: Medicare Other | Source: Ambulatory Visit | Attending: Gastroenterology | Admitting: Gastroenterology

## 2015-12-18 ENCOUNTER — Encounter (HOSPITAL_COMMUNITY): Payer: Self-pay

## 2015-12-18 DIAGNOSIS — Z9012 Acquired absence of left breast and nipple: Secondary | ICD-10-CM | POA: Diagnosis not present

## 2015-12-18 DIAGNOSIS — K921 Melena: Secondary | ICD-10-CM | POA: Insufficient documentation

## 2015-12-18 DIAGNOSIS — Z952 Presence of prosthetic heart valve: Secondary | ICD-10-CM | POA: Insufficient documentation

## 2015-12-18 DIAGNOSIS — M170 Bilateral primary osteoarthritis of knee: Secondary | ICD-10-CM | POA: Diagnosis not present

## 2015-12-18 DIAGNOSIS — I10 Essential (primary) hypertension: Secondary | ICD-10-CM | POA: Diagnosis not present

## 2015-12-18 DIAGNOSIS — I4891 Unspecified atrial fibrillation: Secondary | ICD-10-CM | POA: Diagnosis not present

## 2015-12-18 DIAGNOSIS — R195 Other fecal abnormalities: Secondary | ICD-10-CM | POA: Diagnosis not present

## 2015-12-18 DIAGNOSIS — Z8673 Personal history of transient ischemic attack (TIA), and cerebral infarction without residual deficits: Secondary | ICD-10-CM | POA: Insufficient documentation

## 2015-12-18 DIAGNOSIS — E119 Type 2 diabetes mellitus without complications: Secondary | ICD-10-CM | POA: Diagnosis not present

## 2015-12-18 DIAGNOSIS — R109 Unspecified abdominal pain: Secondary | ICD-10-CM | POA: Diagnosis present

## 2015-12-18 DIAGNOSIS — K625 Hemorrhage of anus and rectum: Secondary | ICD-10-CM | POA: Diagnosis not present

## 2015-12-18 HISTORY — PX: FLEXIBLE SIGMOIDOSCOPY: SHX5431

## 2015-12-18 LAB — GLUCOSE, CAPILLARY: GLUCOSE-CAPILLARY: 103 mg/dL — AB (ref 65–99)

## 2015-12-18 SURGERY — SIGMOIDOSCOPY, FLEXIBLE
Anesthesia: Moderate Sedation

## 2015-12-18 MED ORDER — SODIUM CHLORIDE 0.9 % IV SOLN: INTRAVENOUS | Status: DC

## 2015-12-18 MED ORDER — FENTANYL CITRATE (PF) 100 MCG/2ML IJ SOLN
INTRAMUSCULAR | Status: AC
  Filled 2015-12-18: qty 2

## 2015-12-18 MED ORDER — MIDAZOLAM HCL 5 MG/ML IJ SOLN
INTRAMUSCULAR | Status: AC
  Filled 2015-12-18: qty 2

## 2015-12-18 MED ORDER — MIDAZOLAM HCL 10 MG/2ML IJ SOLN
INTRAMUSCULAR | Status: DC | PRN
  Administered 2015-12-18: 1 mg via INTRAVENOUS
  Administered 2015-12-18: 2 mg via INTRAVENOUS

## 2015-12-18 MED ORDER — FENTANYL CITRATE (PF) 100 MCG/2ML IJ SOLN
INTRAMUSCULAR | Status: DC | PRN
  Administered 2015-12-18: 25 ug via INTRAVENOUS

## 2015-12-18 NOTE — H&P (Signed)
Rachel Santos HPI: This is a 75 year old female with complaints of abdominal pain that started two weeks ago, but now it has resolved.  With the abdominal pain, she experienced hematochezia.  The bleeding seemed to have cleared up, but during the evaluation in the office she reported a recurrence of her hematochezia.  This was confirmed with a rectal examination.  Her colonoscopy last year for routine purposes was significant for 1-2 adenomas and no plans for a follow up colonoscopy were recommended.  She reports a distant history of UC, but this most recent colonoscopy was negative for any inflammation.    Past Medical History:  Diagnosis Date  . Arthritis    knees  . Atrial fibrillation ([place])   . Cataracts, bilateral   . Diabetes mellitus   . GERD (gastroesophageal reflux disease)   . Hypertension   . Mitral regurgitation   . PONV (postoperative nausea and vomiting)   . S/P Maze operation for atrial fibrillation 10/11/2012   Complete bilateral atrial lesion set using bipolar radiofrequency and cryothermy ablation with clipping of LA appendage  . 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 Shortness of breath    with activity  . Stroke [facility])    mini stroke with vision problems  . Tricuspid regurgitation     Past Surgical History:  Procedure Laterality Date  . CARDIAC CATHETERIZATION  09-05-12  . CARDIOVERSION N/A 07/31/2012   Procedure: CARDIOVERSION;  Surgeon: Josue Hector, MD;  Location: [place];  Service: Cardiovascular;  Laterality: N/A;  . CESAREAN SECTION     x 2  . COLONOSCOPY WITH PROPOFOL N/A 11/28/2014   Procedure: COLONOSCOPY WITH PROPOFOL;  Surgeon: Carol Ada, MD;  [facility] ENDOS[facility];  Service: Endoscopy;  Laterality: N/A;  . INTRAOPERATIVE TRANSESOPHAGEAL ECHOCARDIOGRAM N/A 10/11/2012   Procedure: INTRAOPERATIVE TRANSESOPHAGEAL ECHOCARDIOGRAM;  Surgeon: Rexene Alberts, MD;  Location: [place];  Service: Open Heart Surgery;  Laterality: N/A;  . MASTECTOMY Left 1982  . MAZE N/A 10/11/2012   Procedure: MAZE;  Surgeon: Rexene Alberts, MD;  Location: [place];  Service: Open Heart Surgery;  Laterality: N/A;  . MITRAL VALVE REPAIR N/A 10/11/2012   Procedure: MITRAL VALVE REPAIR (MVR);  Surgeon: Rexene Alberts, MD;  Location: [place];  Service: Open Heart Surgery;  Laterality: N/A;  . TEE WITHOUT CARDIOVERSION N/A 07/31/2012   Procedure: TRANSESOPHAGEAL ECHOCARDIOGRAM (TEE);  Surgeon: Josue Hector, MD;  Location: [facility] ENDOSCOPY;  Service: Cardiovascular;  Laterality: N/A;  . TRICUSPID VALVE REPLACEMENT N/A 10/11/2012   Procedure: TRICUSPID VALVE REPAIR;  Surgeon: Rexene Alberts, MD;  Location: [place];  Service: Open Heart Surgery;  Laterality: N/A;  . TUBAL LIGATION      Family History  Problem Relation Age of Onset  . Cancer Father     prostate  . Hypertension Father   . Heart attack Paternal Grandfather   . Stroke Neg Hx     Social History:  reports that she has never smoked. She has never used smokeless tobacco. She reports that she does not drink alcohol or use drugs.  Allergies:  Allergies  Allergen Reactions  . Ultram [Tramadol] Nausea And Vomiting    Medications:  Scheduled:  Continuous: . sodium chloride      No results found for this or any previous visit (  from the past 24 hour(s)).   No results found.  ROS:  As stated above in the HPI otherwise negative.  Blood pressure (!) 123/47, pulse (!) 55, temperature 97.8 F (36.6 C), temperature source Oral, resp. rate 13, height 5\' 7"  (1.702 m), weight 74.8 kg (165 lb), SpO2 98 %.    PE: Gen: NAD, Alert and Oriented HEENT:  [place]/AT, EOMI Neck: Supple, no LAD Lungs: CTA Bilaterally CV: RRR without M/G/R ABM: Soft, NTND, +BS Ext: No C/C/E  Assessment/Plan: 1) Hematochezia - FFS.  [name] D 12/18/2015, 8:38  AM

## 2015-12-18 NOTE — Op Note (Signed)
[facility] Patient Name: Rachel Santos Procedure Date: 12/18/2015 MRN: [id number] Attending MD: Carol Ada , MD Date of Birth: [birth year] CSN: [id number] Age: 75 Admit Type: Outpatient Procedure:                Flexible Sigmoidoscopy Indications:              Hematochezia Providers:                Carol Ada, MD, Sarah Monday RN, RN, Elspeth Cho Tech., Technician Referring MD:              Medicines:                Fentanyl 25 micrograms IV, Midazolam 3 mg IV Complications:            No immediate complications. Estimated Blood Loss:     Estimated blood loss: none. Procedure:                Pre-Anesthesia Assessment:                           - Prior to the procedure, a History and Physical                            was performed, and patient medications and                            allergies were reviewed. The patient's tolerance of                            previous anesthesia was also reviewed. The risks                            and benefits of the procedure and the sedation                            options and risks were discussed with the patient.                            All questions were answered, and informed consent                            was obtained. Prior Anticoagulants: The patient has                            taken no previous anticoagulant or antiplatelet                            agents. ASA Grade Assessment: III - A patient with                            severe systemic disease. After reviewing the risks  and benefits, the patient was deemed in                            satisfactory condition to undergo the procedure.                           After obtaining informed consent, the scope was                            passed under direct vision. The [id number])                            scope was introduced through the anus and advanced    to the the sigmoid colon. The flexible                            sigmoidoscopy was accomplished without difficulty.                            The patient tolerated the procedure well. The                            quality of the bowel preparation was adequate. Scope In: Scope Out: Findings:      The entire examined colon appeared normal. Impression:               - The entire examined colon is normal.                           - No specimens collected. Moderate Sedation:      Moderate (conscious) sedation was administered by the endoscopy nurse       and supervised by the endoscopist. The following parameters were       monitored: oxygen saturation, heart rate, blood pressure, and response       to care. Recommendation:           - Patient has a contact number available for                            emergencies. The signs and symptoms of potential                            delayed complications were discussed with the                            patient. Return to normal activities tomorrow.                            Written discharge instructions were provided to the                            patient.                           - Resume previous diet. Procedure Code(s):        --- Professional ---  [id number], Sigmoidoscopy, flexible; diagnostic,                            including collection of specimen(s) by brushing or                            washing, when performed (separate procedure) Diagnosis Code(s):        --- Professional ---                           K92.1, Melena (includes Hematochezia) CPT copyright 2016 American Medical Association. All rights reserved. The codes documented in this report are preliminary and upon coder review may  be revised to meet current compliance requirements. Carol Ada, MD Carol Ada, MD 12/18/2015 9:45:33 AM This report has been signed electronically. Number of Addenda: 0

## 2015-12-18 NOTE — Discharge Instructions (Signed)
YOU HAD AN ENDOSCOPIC PROCEDURE TODAY: Refer to the procedure report and other information in the discharge instructions given to you for any specific questions about what was found during the examination. If this information does not answer your questions, please call [facility] at [phone] to clarify.  ° °YOU SHOULD EXPECT: Some feelings of bloating in the abdomen. Passage of more gas than usual. Walking can help get rid of the air that was put into your GI tract during the procedure and reduce the bloating. If you had a lower endoscopy (such as a colonoscopy or flexible sigmoidoscopy) you may notice spotting of blood in your stool or on the toilet paper. Some abdominal soreness may be present for a day or two, also. ° °DIET: Your first meal following the procedure should be a light meal and then it is ok to progress to your normal diet. A half-sandwich or bowl of soup is an example of a good first meal. Heavy or fried foods are harder to digest and may make you feel nauseous or bloated. Drink plenty of fluids but you should avoid alcoholic beverages for 24 hours. If you had an esophageal dilation, please see attached information for diet.  ° °ACTIVITY: Your care partner should take you home directly after the procedure. You should plan to take it easy, moving slowly for the rest of the day. You can resume normal activity the day after the procedure however YOU SHOULD NOT DRIVE, use power tools, machinery or perform tasks that involve climbing or major physical exertion for 24 hours (because of the sedation medicines used during the test).  ° °SYMPTOMS TO REPORT IMMEDIATELY: °A gastroenterologist can be reached at any hour. Please call [phone]  for any of the following symptoms:  °Following lower endoscopy (colonoscopy, flexible sigmoidoscopy) °Excessive amounts of blood in the stool  °Significant tenderness, worsening of abdominal pains  °Swelling of the abdomen that is new, acute  °Fever of  100° or higher  °Following upper endoscopy (EGD, EUS, ERCP, esophageal dilation) °Vomiting of blood or coffee ground material  °New, significant abdominal pain  °New, significant chest pain or pain under the shoulder blades  °Painful or persistently difficult swallowing  °New shortness of breath  °Black, tarry-looking or red, bloody stools ° °FOLLOW UP:  °If any biopsies were taken you will be contacted by phone or by letter within the next 1-3 weeks. Call [phone]  if you have not heard about the biopsies in 3 weeks.  °Please also call with any specific questions about appointments or follow up tests. ° °

## 2015-12-18 NOTE — Progress Notes (Signed)
Post procedure we were exiting the procedure room, the patient was still drowsy from the sedation she received and put her hand outside the stretcher as we were going through the doorway. We immediately pulled back into the room and I assessed her hand. It was "sore" according to the patient but she was able to move all her fingers and her hand. When coming into the recovery room Ice was applied. Her daughter, [name]  and Dr. Benson Norway were notified. Pt was still drowsy at that point and report was given to the recovery room nurse. [facility] Portal was completed as well. [facility]

## 2015-12-21 ENCOUNTER — Encounter (HOSPITAL_COMMUNITY): Payer: Self-pay | Admitting: Gastroenterology

## 2016-01-06 DIAGNOSIS — E1065 Type 1 diabetes mellitus with hyperglycemia: Secondary | ICD-10-CM | POA: Diagnosis not present

## 2016-01-06 DIAGNOSIS — E782 Mixed hyperlipidemia: Secondary | ICD-10-CM | POA: Diagnosis not present

## 2016-01-06 DIAGNOSIS — Z0000 Encounter for general adult medical examination without abnormal findings: Secondary | ICD-10-CM | POA: Diagnosis not present

## 2016-01-06 DIAGNOSIS — I4891 Unspecified atrial fibrillation: Secondary | ICD-10-CM | POA: Diagnosis not present

## 2016-01-13 DIAGNOSIS — Z23 Encounter for immunization: Secondary | ICD-10-CM | POA: Diagnosis not present

## 2016-01-13 DIAGNOSIS — E1065 Type 1 diabetes mellitus with hyperglycemia: Secondary | ICD-10-CM | POA: Diagnosis not present

## 2016-01-13 DIAGNOSIS — I4891 Unspecified atrial fibrillation: Secondary | ICD-10-CM | POA: Diagnosis not present

## 2016-01-13 DIAGNOSIS — K219 Gastro-esophageal reflux disease without esophagitis: Secondary | ICD-10-CM | POA: Diagnosis not present

## 2016-01-13 DIAGNOSIS — E782 Mixed hyperlipidemia: Secondary | ICD-10-CM | POA: Diagnosis not present

## 2016-01-21 DIAGNOSIS — Z7901 Long term (current) use of anticoagulants: Secondary | ICD-10-CM | POA: Diagnosis not present

## 2016-01-21 DIAGNOSIS — I4891 Unspecified atrial fibrillation: Secondary | ICD-10-CM | POA: Diagnosis not present

## 2016-02-23 DIAGNOSIS — Z7901 Long term (current) use of anticoagulants: Secondary | ICD-10-CM | POA: Diagnosis not present

## 2016-02-23 DIAGNOSIS — I4891 Unspecified atrial fibrillation: Secondary | ICD-10-CM | POA: Diagnosis not present

## 2016-04-05 DIAGNOSIS — I4891 Unspecified atrial fibrillation: Secondary | ICD-10-CM | POA: Diagnosis not present

## 2016-04-05 DIAGNOSIS — Z7901 Long term (current) use of anticoagulants: Secondary | ICD-10-CM | POA: Diagnosis not present

## 2016-05-02 DIAGNOSIS — Z7901 Long term (current) use of anticoagulants: Secondary | ICD-10-CM | POA: Diagnosis not present

## 2016-05-02 DIAGNOSIS — I4891 Unspecified atrial fibrillation: Secondary | ICD-10-CM | POA: Diagnosis not present

## 2016-06-02 DIAGNOSIS — Z7901 Long term (current) use of anticoagulants: Secondary | ICD-10-CM | POA: Diagnosis not present

## 2016-06-02 DIAGNOSIS — I4891 Unspecified atrial fibrillation: Secondary | ICD-10-CM | POA: Diagnosis not present

## 2016-07-05 ENCOUNTER — Telehealth: Payer: Self-pay | Admitting: Cardiovascular Disease

## 2016-07-05 DIAGNOSIS — E1065 Type 1 diabetes mellitus with hyperglycemia: Secondary | ICD-10-CM | POA: Diagnosis not present

## 2016-07-05 DIAGNOSIS — Z01419 Encounter for gynecological examination (general) (routine) without abnormal findings: Secondary | ICD-10-CM | POA: Diagnosis not present

## 2016-07-05 DIAGNOSIS — N390 Urinary tract infection, site not specified: Secondary | ICD-10-CM | POA: Diagnosis not present

## 2016-07-05 DIAGNOSIS — I4891 Unspecified atrial fibrillation: Secondary | ICD-10-CM | POA: Diagnosis not present

## 2016-07-05 DIAGNOSIS — Z124 Encounter for screening for malignant neoplasm of cervix: Secondary | ICD-10-CM | POA: Diagnosis not present

## 2016-07-05 DIAGNOSIS — E782 Mixed hyperlipidemia: Secondary | ICD-10-CM | POA: Diagnosis not present

## 2016-07-05 NOTE — Telephone Encounter (Signed)
New Message    Please call pt is concerned about heart murmur

## 2016-07-05 NOTE — Telephone Encounter (Signed)
Rachel Santos is calling because she saw her Dermatologist and they told her she has a heart murmur and she does not know what to do about this . She had open heart surgery already . Please call .Marland Kitchen Thanks

## 2016-07-05 NOTE — Telephone Encounter (Signed)
Left message on machine for pt to contact the office.   

## 2016-07-05 NOTE — Telephone Encounter (Signed)
The pt saw her GYN and was told she has a heart murmur and this made her anxious. Reviewed chart and the pt does have mild to moderate AS per Echo last year. The pt said she forgot about this information and she has scheduled her Echo and ROV in June as advised.  I asked the pt how she is feeling and she states that she does have some SOB with exertion and this has been more noticeable. The pt also feels a funny feeling like an ache in her chest but it is hard to describe. The pt is scheduled to see her PCP next week for physical.  I advised the pt that I would forward this information to Dr Angelena Form to review.

## 2016-07-06 NOTE — Telephone Encounter (Signed)
I spoke with pt and gave her information from Dr. Angelena Form.  She does not feel she needs a sooner appt and will plan on keeping 6/25 appt.

## 2016-07-06 NOTE — Telephone Encounter (Signed)
I will plan to see her in June unless he feels that she needs to be seen sooner. Gerald Stabs

## 2016-07-13 DIAGNOSIS — E1065 Type 1 diabetes mellitus with hyperglycemia: Secondary | ICD-10-CM | POA: Diagnosis not present

## 2016-07-13 DIAGNOSIS — K219 Gastro-esophageal reflux disease without esophagitis: Secondary | ICD-10-CM | POA: Diagnosis not present

## 2016-07-13 DIAGNOSIS — E782 Mixed hyperlipidemia: Secondary | ICD-10-CM | POA: Diagnosis not present

## 2016-07-13 DIAGNOSIS — Z853 Personal history of malignant neoplasm of breast: Secondary | ICD-10-CM | POA: Diagnosis not present

## 2016-07-13 DIAGNOSIS — Z1231 Encounter for screening mammogram for malignant neoplasm of breast: Secondary | ICD-10-CM | POA: Diagnosis not present

## 2016-07-14 DIAGNOSIS — R102 Pelvic and perineal pain: Secondary | ICD-10-CM | POA: Diagnosis not present

## 2016-07-28 DIAGNOSIS — Z7901 Long term (current) use of anticoagulants: Secondary | ICD-10-CM | POA: Diagnosis not present

## 2016-07-28 DIAGNOSIS — I4891 Unspecified atrial fibrillation: Secondary | ICD-10-CM | POA: Diagnosis not present

## 2016-08-25 DIAGNOSIS — E782 Mixed hyperlipidemia: Secondary | ICD-10-CM | POA: Diagnosis not present

## 2016-08-25 DIAGNOSIS — E1065 Type 1 diabetes mellitus with hyperglycemia: Secondary | ICD-10-CM | POA: Diagnosis not present

## 2016-08-25 DIAGNOSIS — I4891 Unspecified atrial fibrillation: Secondary | ICD-10-CM | POA: Diagnosis not present

## 2016-09-19 ENCOUNTER — Ambulatory Visit: Payer: Medicare Other | Admitting: Cardiovascular Disease

## 2016-09-19 ENCOUNTER — Other Ambulatory Visit (HOSPITAL_COMMUNITY): Payer: Medicare Other

## 2016-09-19 DIAGNOSIS — M25561 Pain in right knee: Secondary | ICD-10-CM | POA: Diagnosis not present

## 2016-09-19 DIAGNOSIS — R42 Dizziness and giddiness: Secondary | ICD-10-CM | POA: Diagnosis not present

## 2016-09-19 DIAGNOSIS — W19XXXA Unspecified fall, initial encounter: Secondary | ICD-10-CM | POA: Diagnosis not present

## 2016-09-19 DIAGNOSIS — R2689 Other abnormalities of gait and mobility: Secondary | ICD-10-CM | POA: Diagnosis not present

## 2016-09-20 ENCOUNTER — Telehealth: Payer: Self-pay | Admitting: Cardiovascular Disease

## 2016-09-20 NOTE — Telephone Encounter (Signed)
New message    Per daughter: Pt fell yesterday , she said she was not groggy or dizzy, her knees did not buckle one minutes she was standing and the next she was laying on the coffee table,  PCP said its because her gait is off .

## 2016-09-20 NOTE — Progress Notes (Signed)
Cardiology Office Note   Date:  09/21/2016   [name], DOB [birth year], MRN [id number]  PCP:  Merrilee Seashore, MD  Cardiologist:  Dr. Angelena Form     Chief Complaint  Patient presents with  . Loss of Consciousness    unsure if true syncope      History of Present Illness: Rachel Santos is a 76 y.o. female who presents for syncope per her daughter.   She has a hx. Of history of DM, HTN, OA, atrial fibrillation, GERD, cataracts, a fib Dx since 2014. Also severe MR. She is on xarelto.   Echo with normal LV size and function, severe MR, moderate TR. She had a TEE guided cardioversion on Jul 31, 2012 with return to sinus rhythm. Her MR was noted to be central and severe. The LV cavity was not dilated. LV systolic function was normal. There was moderate to severe TR as well. She converted to NSR. She was felt to be symptomatic from her mitral valve disease. Cardiac cath on 09/05/12 with mild CAD. She underwent placement of mitral valve ring and tricuspid valve ring as well as MAZE procedure per Dr. Roxy Manns on 10/11/12. Post-operative course complicated by recurrence of atrial fibrillation with bradycardia. She was seen by Dr. Caryl Comes as an EP consult and pacemaker not recommended. She was discharged home on amiodarone and coumadin  Amiodarone has since been stopped.   Pt's daughter called yesterday and her mother the pt had passed out.  She was now feeling fine.  Today she tells me she had stood up and picked up the cat and then woke up leaning across the coffee table.  She did not injure herself.  She does admit to occ lightheadedness going from sitting to standing.  We did orthostatic BP check today.  She denies chest pain or SOB except after vacuuming she may be a SOB and have to stop.  Pt was not lightheaded or dizzy at time that she remembers and no awareness of heart beat.  She tells me she drinks fluids freq during the day.    Past Medical History:  Diagnosis Date  .  Arthritis    knees  . Atrial fibrillation ([place])   . Cataracts, bilateral   . Diabetes mellitus   . GERD (gastroesophageal reflux disease)   . Hypertension   . Mitral regurgitation   . PONV (postoperative nausea and vomiting)   . S/P Maze operation for atrial fibrillation 10/11/2012   Complete bilateral atrial lesion set using bipolar radiofrequency and cryothermy ablation with clipping of LA appendage  . 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   80mm Sorin Memo 3D ring annuloplasty with 26 mm Edwards mc3 tricuspid ring annuloplasty   . Shortness of breath    with activity  . Stroke [facility])    mini stroke with vision problems  . Tricuspid regurgitation     Past Surgical History:  Procedure Laterality Date  . CARDIAC CATHETERIZATION  09-05-12  . CARDIOVERSION N/A 07/31/2012   Procedure: CARDIOVERSION;  Surgeon: Josue Hector, MD;  Location: [place];  Service: Cardiovascular;  Laterality: N/A;  . CESAREAN SECTION     x 2  . COLONOSCOPY WITH PROPOFOL N/A 11/28/2014   Procedure: COLONOSCOPY WITH PROPOFOL;  Surgeon: Carol Ada, MD;  [facility];  Service: Endoscopy;  Laterality: N/A;  . FLEXIBLE SIGMOIDOSCOPY N/A 12/18/2015   Procedure: FLEXIBLE SIGMOIDOSCOPY;  Surgeon:  Carol Ada, MD;  Location: [facility];  Service: Endoscopy;  Laterality: N/A;  . INTRAOPERATIVE TRANSESOPHAGEAL ECHOCARDIOGRAM N/A 10/11/2012   Procedure: INTRAOPERATIVE TRANSESOPHAGEAL ECHOCARDIOGRAM;  Surgeon: Rexene Alberts, MD;  Location: [place];  Service: Open Heart Surgery;  Laterality: N/A;  . MASTECTOMY Left 1982  . MAZE N/A 10/11/2012   Procedure: MAZE;  Surgeon: Rexene Alberts, MD;  Location: [place];  Service: Open Heart Surgery;  Laterality: N/A;  . MITRAL VALVE REPAIR N/A 10/11/2012   Procedure: MITRAL VALVE REPAIR (MVR);  Surgeon: Rexene Alberts, MD;  Location: [place];  Service: Open Heart Surgery;  Laterality: N/A;  . TEE  WITHOUT CARDIOVERSION N/A 07/31/2012   Procedure: TRANSESOPHAGEAL ECHOCARDIOGRAM (TEE);  Surgeon: Josue Hector, MD;  Location: [facility] ENDOSCOPY;  Service: Cardiovascular;  Laterality: N/A;  . TRICUSPID VALVE REPLACEMENT N/A 10/11/2012   Procedure: TRICUSPID VALVE REPAIR;  Surgeon: Rexene Alberts, MD;  Location: [place];  Service: Open Heart Surgery;  Laterality: N/A;  . TUBAL LIGATION       Current Outpatient Prescriptions  Medication Sig Dispense Refill  . acetaminophen (TYLENOL) 325 MG tablet Take 650 mg by mouth every 6 (six) hours as needed for moderate pain or headache.    . calcium carbonate (OS-CAL) 600 MG TABS tablet Take 600 mg by mouth 2 (two) times daily with a meal.     . glipiZIDE (GLUCOTROL XL) 10 MG 24 hr tablet Take 10 mg by mouth daily with breakfast.    . Insulin Detemir (LEVEMIR FLEXPEN) 100 UNIT/ML Pen Inject 20-40 Units into the skin 2 (two) times daily. 40 units in the morning and 20 units at night    . insulin lispro (HUMALOG KWIKPEN) 100 UNIT/ML SOPN Inject 10-15 Units into the skin daily before supper. Sliding scale    . losartan (COZAAR) 100 MG tablet Take 1 tablet (100 mg total) by mouth daily. 30 tablet 1  . lovastatin (MEVACOR) 40 MG tablet TAKE 1 TABLET BY MOUTH DAILY AT BEDTIME 30 tablet 5  . Multiple Vitamin (MULTIVITAMIN) tablet Take 1 tablet by mouth every morning.     Marland Kitchen omeprazole (PRILOSEC) 40 MG capsule Take 40 mg by mouth every morning.     . ONE TOUCH ULTRA TEST test strip 1 each by Other route 3 (three) times daily.   2  . venlafaxine XR (EFFEXOR-XR) 150 MG 24 hr capsule Take 150 mg by mouth every morning.     . venlafaxine XR (EFFEXOR-XR) 75 MG 24 hr capsule Take 75 mg by mouth every morning.    . warfarin (COUMADIN) 3 MG tablet Take 3 mg by mouth as directed. Take by mouth as directed by the coumadin clinic    . amLODipine (NORVASC) 5 MG tablet Take 1 tablet (5 mg total) by mouth daily. 180 tablet 3   No current facility-administered medications for this  visit.     Allergies:   Ultram [tramadol]    Social History:  The patient  reports that she has never smoked. She has never used smokeless tobacco. She reports that she does not drink alcohol or use drugs.   Family History:  The patient's family history includes Cancer in her father; Heart attack in her paternal grandfather; Hypertension in her father.    ROS:  General:no colds or fevers, no weight changes Skin:no rashes or ulcers HEENT:no blurred vision, no congestion CV:see HPI PUL:see HPI GI:no diarrhea constipation or melena, no indigestion GU:no hematuria, no dysuria MS:no joint pain, no claudication Neuro:no syncope,  no lightheadedness Endo:no diabetes, no thyroid disease  Wt Readings from Last 3 Encounters:  09/21/16 161 lb (73 kg)  12/18/15 165 lb (74.8 kg)  09/25/15 165 lb 3.2 oz (74.9 kg)     PHYSICAL EXAM: VS:  Ht 5\' 7"  (1.702 m)   Wt 161 lb (73 kg)   SpO2 96%   BMI 25.22 kg/m  , BMI Body mass index is 25.22 kg/m. General:Pleasant affect, NAD Skin:Warm and dry, brisk capillary refill HEENT:normocephalic, sclera clear, mucus membranes moist Neck:supple, no JVD, no bruits  VPXTG:G2I9 RRR with systolic murmur, no gallup, rub or click Lungs:clear without rales, rhonchi, or wheezes SWN:IOEV, non tender, + BS, do not palpate liver spleen or masses Ext:no lower ext edema, 2+ pedal pulses, 2+ radial pulses Neuro:alert and oriented, MAE, follows commands, + facial symmetry    EKG:  EKG is ordered today. The ekg ordered today demonstrates Sinus brady at 59    Recent Labs: No results found for requested labs within last 8760 hours.    Lipid Panel No results found for: CHOL, TRIG, HDL, CHOLHDL, VLDL, LDLCALC, LDLDIRECT     Other studies Reviewed: Additional studies/ records that were reviewed today include: . Echo 08/2015 Study Conclusions  - Left ventricle: The cavity size was normal. Wall thickness was   normal. Systolic function was normal. The  estimated ejection   fraction was in the range of 55% to 60%. Wall motion was normal;   there were no regional wall motion abnormalities. Doppler   parameters are consistent with high ventricular filling pressure. - Aortic valve: Valve mobility was restricted. There was mild to   moderate stenosis. Valve area (VTI): 1.38 cm^2. Valve area   (Vmax): 1.41 cm^2. Valve area (Vmean): 1.28 cm^2. - Mitral valve: Calcified annulus. Prior procedures included   surgical repair. The findings are consistent with mild stenosis.   Valve area by pressure half-time: 1.52 cm^2. Valve area by   continuity equation (using LVOT flow): 1.46 cm^2. - Left atrium: The atrium was moderately dilated. - Tricuspid valve: Prior procedures included surgical repair.  Impressions:  - Normal LV systolic function; elevated LV filling pressure;   moderate LAE; calcified aortic valve with mild to moderate AS;   s/p MV repair with mild MS and trace MR; s/p TV repair with trace   TR.   ASSESSMENT AND PLAN:  1. Syncope/ Orthostatic hypotension, discussed with Dr. Meda Coffee, will decrease the amlodipine to 5 mg daily and follow up in 2 weeks for orthostatic BP check.  Also asked her to wear support stocking, she has some at home.  If symptoms continue then would do event monitor to look for a fib or a cardiac event for syncope.  Will check BMP, CBC and TSH now.    2.  MV Repair and TV repair. Is doing well.for echo in August, though if symptoms continue will move that up.    3. Mild to mod AS on last echo, see above.   4. PAF not aware of any irregular HR but if further symptoms she will need event monitor. On coumadin  5. Hx of CAD - mild by cath in 2014   6. DM-2 has been stable per pt and hgbA1c 6.9     Current medicines are reviewed with the patient today.  The patient Has no concerns regarding medicines.  The following changes have been made:  See above Labs/ tests ordered today include:see  above  Disposition:   FU:  see above  Signed, Mickel Baas  Dorene Ar, NP  09/21/2016 4:54 PM    [facility] [place], [place], [place] Phone: [phone]; Fax: [phone]

## 2016-09-20 NOTE — Telephone Encounter (Signed)
Spoke with daughter who is reporting mom had an episode on Saturday where she remembers picking up the cat, went to put him down, remembers falling and then woke up on the coffee table.  She does not say she "passed out".  Pt was evaluated by her PCP who by report told her he felt like her knee buckled and that's why she fell.  Pt not daughter feel like this is the case.  They are concerned something is going on with her heart or BP.  Scheduled appt her pt tomorrow with Cecilie Kicks, NP.  Daughter is aware and will make pt aware.

## 2016-09-21 ENCOUNTER — Ambulatory Visit (INDEPENDENT_AMBULATORY_CARE_PROVIDER_SITE_OTHER): Payer: Medicare Other | Admitting: Cardiology

## 2016-09-21 ENCOUNTER — Encounter: Payer: Self-pay | Admitting: Cardiology

## 2016-09-21 VITALS — Ht 67.0 in | Wt 161.0 lb

## 2016-09-21 DIAGNOSIS — I4891 Unspecified atrial fibrillation: Secondary | ICD-10-CM

## 2016-09-21 DIAGNOSIS — Z794 Long term (current) use of insulin: Secondary | ICD-10-CM

## 2016-09-21 DIAGNOSIS — I071 Rheumatic tricuspid insufficiency: Secondary | ICD-10-CM

## 2016-09-21 DIAGNOSIS — R55 Syncope and collapse: Secondary | ICD-10-CM

## 2016-09-21 DIAGNOSIS — E119 Type 2 diabetes mellitus without complications: Secondary | ICD-10-CM

## 2016-09-21 DIAGNOSIS — I951 Orthostatic hypotension: Secondary | ICD-10-CM | POA: Diagnosis not present

## 2016-09-21 DIAGNOSIS — I340 Nonrheumatic mitral (valve) insufficiency: Secondary | ICD-10-CM

## 2016-09-21 DIAGNOSIS — I2510 Atherosclerotic heart disease of native coronary artery without angina pectoris: Secondary | ICD-10-CM

## 2016-09-21 MED ORDER — AMLODIPINE BESYLATE 5 MG PO TABS: 5.0000 mg | ORAL_TABLET | Freq: Every day | ORAL | 3 refills | Status: DC

## 2016-09-21 NOTE — Patient Instructions (Signed)
Medication Instructions:  Your physician recommends that you continue on your current medications as directed. Please refer to the Current Medication list given to you today.   Labwork: Your physician recommends that you return for lab work today for CBC, BMET, TSH  Testing/Procedures: None Ordered    Follow-Up: Your physician recommends that you schedule a follow-up appointment in: 2 weeks with Cecilie Kicks for follow up orthostatics   Any Other Special Instructions Will Be Listed Below (If Applicable).     If you need a refill on your cardiac medications before your next appointment, please call your pharmacy.

## 2016-09-22 ENCOUNTER — Ambulatory Visit: Payer: Medicare Other | Admitting: Cardiology

## 2016-09-22 ENCOUNTER — Telehealth: Payer: Self-pay | Admitting: Cardiovascular Disease

## 2016-09-22 LAB — BASIC METABOLIC PANEL
BUN / CREAT RATIO: 33 — AB (ref 12–28)
BUN: 27 mg/dL (ref 8–27)
CHLORIDE: 103 mmol/L (ref 96–106)
CO2: 21 mmol/L (ref 20–29)
CREATININE: 0.83 mg/dL (ref 0.57–1.00)
Calcium: 9.4 mg/dL (ref 8.7–10.3)
GFR calc non Af Amer: 68 mL/min/{1.73_m2} (ref >59)
GFR, EST AFRICAN AMERICAN: 79 mL/min/{1.73_m2} (ref >59)
GLUCOSE: 204 mg/dL — AB (ref 65–99)
Potassium: 4.4 mmol/L (ref 3.5–5.2)
SODIUM: 141 mmol/L (ref 134–144)

## 2016-09-22 LAB — CBC
Hematocrit: 40.5 % (ref 34.0–46.6)
Hemoglobin: 13.5 g/dL (ref 11.1–15.9)
MCH: 29.7 pg (ref 26.6–33.0)
MCHC: 33.3 g/dL (ref 31.5–35.7)
MCV: 89 fL (ref 79–97)
Platelets: 266 10*3/uL (ref 150–379)
RBC: 4.55 x10E6/uL (ref 3.77–5.28)
RDW: 13.6 % (ref 12.3–15.4)
WBC: 7.2 10*3/uL (ref 3.4–10.8)

## 2016-09-22 LAB — TSH: TSH: 4.1 u[IU]/mL (ref 0.450–4.500)

## 2016-09-22 NOTE — Telephone Encounter (Signed)
°  Follow Up  Pt is calling following up on call regarding most recent blood work results. Please call.

## 2016-09-22 NOTE — Telephone Encounter (Signed)
I spoke with pt and reviewed lab results with her.  She reports prescription for amlodipine was sent to [facility].  She now uses [facility]. She has cancelled prescription at [facility]. She has active prescription at [facility] and does not need new prescription sent in at this time.

## 2016-09-26 ENCOUNTER — Telehealth: Payer: Self-pay

## 2016-09-26 DIAGNOSIS — M1712 Unilateral primary osteoarthritis, left knee: Secondary | ICD-10-CM | POA: Diagnosis not present

## 2016-09-26 DIAGNOSIS — M1711 Unilateral primary osteoarthritis, right knee: Secondary | ICD-10-CM | POA: Diagnosis not present

## 2016-09-26 NOTE — Telephone Encounter (Signed)
Pt is aware and agreeable to normal results. Pt is aware of historically elevated blood sugar. Encouraged to keep upcoming 10/06/16 appt. We are both agreeable to plan.

## 2016-10-05 NOTE — Progress Notes (Signed)
Cardiology Office Note   Date:  10/06/2016   [name], DOB Aug 09, 1940, MRN [id number]  PCP:  Merrilee Seashore, MD  Cardiologist:  Dr. Angelena Form    Chief Complaint  Patient presents with  . Hypotension     with syncope on last visit.       History of Present Illness: Rachel Santos is a 76 y.o. female who presents for recent syncope.  Last seen 09/21/16.   She has a hx. Of history of DM, HTN, OA, atrial fibrillation, GERD, cataracts, a fib Dx since 2014. Also severe MR. She is on xarelto.   Echo with normal LV size and function, severe MR, moderate TR. She had a TEE guided cardioversion on Jul 31, 2012 with return to sinus rhythm. Her MR was noted to be central and severe. The LV cavity was not dilated. LV systolic function was normal. There was moderate to severe TR as well. She converted to NSR. She was felt to be symptomatic from her mitral valve disease. Cardiac cath on 09/05/12 with mild CAD. She underwent placement of mitral valve ring and tricuspid valve ring as well as MAZE procedure per Dr. Roxy Manns on 10/11/12. Post-operative course complicated by recurrence of atrial fibrillation with bradycardia. She was seen by Dr. Caryl Comes as an EP consult and pacemaker not recommended. She was discharged home on amiodarone and coumadin  Amiodarone has since been stopped.  She did have orthostatic hypotension.  Amlodipine decreased to 5 mg and back today for follow up.  Asked her to wear her support stocking.  Labs.were normal except for elevated glucose but non fasting. TSH was 4.110.  Today is feeling much better.  Rare lightheadedness.  No chest pain and no SOB.  Wearing support hose some of the time.  But feels better.   Past Medical History:  Diagnosis Date  . Arthritis    knees  . Atrial fibrillation ([place])   . Cataracts, bilateral   . Diabetes mellitus   . GERD (gastroesophageal reflux disease)   . Hypertension   . Mitral regurgitation   . PONV (postoperative  nausea and vomiting)   . S/P Maze operation for atrial fibrillation 10/11/2012   Complete bilateral atrial lesion set using bipolar radiofrequency and cryothermy ablation with clipping of LA appendage  . 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   64mm Sorin Memo 3D ring annuloplasty with 26 mm Edwards mc3 tricuspid ring annuloplasty   . Shortness of breath    with activity  . Stroke [facility])    mini stroke with vision problems  . Tricuspid regurgitation     Past Surgical History:  Procedure Laterality Date  . CARDIAC CATHETERIZATION  09-05-12  . CARDIOVERSION N/A 07/31/2012   Procedure: CARDIOVERSION;  Surgeon: Josue Hector, MD;  Location: [place];  Service: Cardiovascular;  Laterality: N/A;  . CESAREAN SECTION     x 2  . COLONOSCOPY WITH PROPOFOL N/A 11/28/2014   Procedure: COLONOSCOPY WITH PROPOFOL;  Surgeon: Carol Ada, MD;  [facility];  Service: Endoscopy;  Laterality: N/A;  . FLEXIBLE SIGMOIDOSCOPY N/A 12/18/2015   Procedure: FLEXIBLE SIGMOIDOSCOPY;  Surgeon: Carol Ada, MD;  [facility];  Service: Endoscopy;  Laterality: N/A;  . INTRAOPERATIVE TRANSESOPHAGEAL ECHOCARDIOGRAM N/A 10/11/2012   Procedure: INTRAOPERATIVE TRANSESOPHAGEAL ECHOCARDIOGRAM;  Surgeon: Rexene Alberts, MD;  Location: [place];  Service: Open Heart Surgery;  Laterality: N/A;  . MASTECTOMY Left  [place] N/A 10/11/2012   Procedure: MAZE;  Surgeon: Rexene Alberts, MD;  Location: [place];  Service: Open Heart Surgery;  Laterality: N/A;  . MITRAL VALVE REPAIR N/A 10/11/2012   Procedure: MITRAL VALVE REPAIR (MVR);  Surgeon: Rexene Alberts, MD;  Location: [place];  Service: Open Heart Surgery;  Laterality: N/A;  . TEE WITHOUT CARDIOVERSION N/A 07/31/2012   Procedure: TRANSESOPHAGEAL ECHOCARDIOGRAM (TEE);  Surgeon: Josue Hector, MD;  Location: [facility] ENDOSCOPY;  Service: Cardiovascular;  Laterality: N/A;  . TRICUSPID VALVE  REPLACEMENT N/A 10/11/2012   Procedure: TRICUSPID VALVE REPAIR;  Surgeon: Rexene Alberts, MD;  Location: [place];  Service: Open Heart Surgery;  Laterality: N/A;  . TUBAL LIGATION       Current Outpatient Prescriptions  Medication Sig Dispense Refill  . acetaminophen (TYLENOL) 325 MG tablet Take 650 mg by mouth every 6 (six) hours as needed for moderate pain or headache.    Marland Kitchen amLODipine (NORVASC) 5 MG tablet Take 1 tablet (5 mg total) by mouth daily. 180 tablet 3  . calcium carbonate (OS-CAL) 600 MG TABS tablet Take 1,400 mg by mouth daily.     Marland Kitchen glipiZIDE (GLUCOTROL XL) 10 MG 24 hr tablet Take 10 mg by mouth daily with breakfast.    . Insulin Detemir (LEVEMIR FLEXPEN) 100 UNIT/ML Pen Inject 22-40 Units into the skin 2 (two) times daily. 40 units in the morning and 22 units at night    . insulin lispro (HUMALOG KWIKPEN) 100 UNIT/ML SOPN Inject 10-15 Units into the skin daily before supper. Sliding scale    . losartan (COZAAR) 100 MG tablet Take 1 tablet (100 mg total) by mouth daily. 30 tablet 1  . lovastatin (MEVACOR) 40 MG tablet TAKE 1 TABLET BY MOUTH DAILY AT BEDTIME 30 tablet 5  . Multiple Vitamin (MULTIVITAMIN) tablet Take 1 tablet by mouth every morning.     Marland Kitchen omeprazole (PRILOSEC) 40 MG capsule Take 40 mg by mouth every morning.     . ONE TOUCH ULTRA TEST test strip 1 each by Other route 3 (three) times daily.   2  . venlafaxine XR (EFFEXOR-XR) 150 MG 24 hr capsule Take 150 mg by mouth every morning.     . venlafaxine XR (EFFEXOR-XR) 75 MG 24 hr capsule Take 75 mg by mouth every morning.    . warfarin (COUMADIN) 3 MG tablet Take 3 mg by mouth as directed. Take by mouth as directed by the coumadin clinic     No current facility-administered medications for this visit.     Allergies:   Ultram [tramadol]    Social History:  The patient  reports that she has never smoked. She has never used smokeless tobacco. She reports that she does not drink alcohol or use drugs.   Family History:   The patient's family history includes Cancer in her father; Heart attack in her paternal grandfather; Hypertension in her father.    ROS:  General:no colds or fevers, no weight changes CV:see HPI PUL:see HPI GI:no diarrhea constipation or melena, no indigestion Neuro:no syncope, no lightheadedness Endo:+ diabetes, no thyroid disease  Wt Readings from Last 3 Encounters:  10/06/16 159 lb 1.9 oz (72.2 kg)  09/21/16 161 lb (73 kg)  12/18/15 165 lb (74.8 kg)     PHYSICAL EXAM: VS:  Ht 5\' 6"  (1.676 m)   Wt 159 lb 1.9 oz (72.2 kg)   BMI 25.68 kg/m  , BMI Body mass index is 25.68 kg/m. General:Pleasant affect,  NAD Skin:Warm and dry, brisk capillary refill HEENT:normocephalic, sclera clear, mucus membranes moist Heart:S1S2 RRR with soft 1/6 systolic murmur, no gallup, rub or click Lungs:clear without rales, rhonchi, or wheezes OIT:GPQD, non tender, + BS, do not palpate liver spleen or masses Ext:no lower ext edema, 2+ pedal pulses, 2+ radial pulses Neuro:alert and oriented X 3, MAE, follows commands, + facial symmetry  BP lying 116/60  Pulse 64 Sitting 100/59    Pulse 68 Standing 105/57  Pulse 73 Standing 3 min 126/63 with pulse 72    EKG:  EKG is NOT ordered today.   Recent Labs: 09/21/2016: BUN 27; Creatinine, Ser 0.83; Hemoglobin 13.5; Platelets 266; Potassium 4.4; Sodium 141; TSH 4.100    Lipid Panel No results found for: CHOL, TRIG, HDL, CHOLHDL, VLDL, LDLCALC, LDLDIRECT     Other studies Reviewed: Additional studies/ records that were reviewed today include: . Echo 09/25/15  Study Conclusions  - Left ventricle: The cavity size was normal. Wall thickness was   normal. Systolic function was normal. The estimated ejection   fraction was in the range of 55% to 60%. Wall motion was normal;   there were no regional wall motion abnormalities. Doppler   parameters are consistent with high ventricular filling pressure. - Aortic valve: Valve mobility was restricted.  There was mild to   moderate stenosis. Valve area (VTI): 1.38 cm^2. Valve area   (Vmax): 1.41 cm^2. Valve area (Vmean): 1.28 cm^2. - Mitral valve: Calcified annulus. Prior procedures included   surgical repair. The findings are consistent with mild stenosis.   Valve area by pressure half-time: 1.52 cm^2. Valve area by   continuity equation (using LVOT flow): 1.46 cm^2. - Left atrium: The atrium was moderately dilated. - Tricuspid valve: Prior procedures included surgical repair.  Impressions:  - Normal LV systolic function; elevated LV filling pressure;   moderate LAE; calcified aortic valve with mild to moderate AS;   s/p MV repair with mild MS and trace MR; s/p TV repair with trace   TR.  ASSESSMENT AND PLAN:  1.  Orthostatic Hypotension now improved with lower dose of amlodipine. Continue support stockings and call if further problems.  2.  MV repair and TV repair for echo in August and then follow up with Dr. Angelena Form  3.   PAF on coumadin.    Current medicines are reviewed with the patient today.  The patient Has no concerns regarding medicines.  The following changes have been made:  See above Labs/ tests ordered today include:see above  Disposition:   FU:  see above  Signed, Cecilie Kicks, NP  10/06/2016 12:15 PM    [facility], [place], [place], [place] Phone: [phone]; Fax: [phone]

## 2016-10-06 ENCOUNTER — Ambulatory Visit (INDEPENDENT_AMBULATORY_CARE_PROVIDER_SITE_OTHER): Payer: Medicare Other | Admitting: Cardiology

## 2016-10-06 ENCOUNTER — Encounter (INDEPENDENT_AMBULATORY_CARE_PROVIDER_SITE_OTHER): Payer: Self-pay

## 2016-10-06 ENCOUNTER — Encounter: Payer: Self-pay | Admitting: Cardiology

## 2016-10-06 VITALS — Ht 66.0 in | Wt 159.1 lb

## 2016-10-06 DIAGNOSIS — I951 Orthostatic hypotension: Secondary | ICD-10-CM

## 2016-10-06 DIAGNOSIS — I480 Paroxysmal atrial fibrillation: Secondary | ICD-10-CM

## 2016-10-06 DIAGNOSIS — I340 Nonrheumatic mitral (valve) insufficiency: Secondary | ICD-10-CM

## 2016-10-06 DIAGNOSIS — Z7901 Long term (current) use of anticoagulants: Secondary | ICD-10-CM | POA: Diagnosis not present

## 2016-10-06 DIAGNOSIS — I4891 Unspecified atrial fibrillation: Secondary | ICD-10-CM | POA: Diagnosis not present

## 2016-10-06 NOTE — Patient Instructions (Addendum)
Medication Instructions:   Your physician recommends that you continue on your current medications as directed. Please refer to the Current Medication list given to you today.   If you need a refill on your cardiac medications before your next appointment, please call your pharmacy.  Labwork: NONE ORDERED  TODAY    Testing/Procedures: NONE ORDERED  TODAY    Follow-Up:   KEEP APPOINTMENT AS SCHEDULED    Any Other Special Instructions Will Be Listed Below (If Applicable).                                                                                                                                                   

## 2016-10-12 DIAGNOSIS — N3000 Acute cystitis without hematuria: Secondary | ICD-10-CM | POA: Diagnosis not present

## 2016-10-12 DIAGNOSIS — R8290 Unspecified abnormal findings in urine: Secondary | ICD-10-CM | POA: Diagnosis not present

## 2016-10-12 DIAGNOSIS — N8111 Cystocele, midline: Secondary | ICD-10-CM | POA: Diagnosis not present

## 2016-10-12 DIAGNOSIS — R339 Retention of urine, unspecified: Secondary | ICD-10-CM | POA: Diagnosis not present

## 2016-10-31 ENCOUNTER — Telehealth: Payer: Self-pay | Admitting: Cardiovascular Disease

## 2016-11-03 ENCOUNTER — Encounter: Payer: Self-pay | Admitting: Cardiovascular Disease

## 2016-11-03 ENCOUNTER — Other Ambulatory Visit: Payer: Self-pay

## 2016-11-03 ENCOUNTER — Ambulatory Visit (INDEPENDENT_AMBULATORY_CARE_PROVIDER_SITE_OTHER): Payer: Medicare Other | Admitting: Cardiovascular Disease

## 2016-11-03 ENCOUNTER — Ambulatory Visit (HOSPITAL_COMMUNITY): Payer: Medicare Other | Attending: Cardiology

## 2016-11-03 VITALS — BP 128/60 | HR 74 | Ht 66.0 in | Wt 165.0 lb

## 2016-11-03 DIAGNOSIS — I350 Nonrheumatic aortic (valve) stenosis: Secondary | ICD-10-CM

## 2016-11-03 DIAGNOSIS — I2510 Atherosclerotic heart disease of native coronary artery without angina pectoris: Secondary | ICD-10-CM | POA: Diagnosis not present

## 2016-11-03 DIAGNOSIS — I361 Nonrheumatic tricuspid (valve) insufficiency: Secondary | ICD-10-CM | POA: Diagnosis not present

## 2016-11-03 DIAGNOSIS — I5030 Unspecified diastolic (congestive) heart failure: Secondary | ICD-10-CM | POA: Insufficient documentation

## 2016-11-03 DIAGNOSIS — I088 Other rheumatic multiple valve diseases: Secondary | ICD-10-CM | POA: Insufficient documentation

## 2016-11-03 DIAGNOSIS — I340 Nonrheumatic mitral (valve) insufficiency: Secondary | ICD-10-CM | POA: Diagnosis not present

## 2016-11-03 DIAGNOSIS — I480 Paroxysmal atrial fibrillation: Secondary | ICD-10-CM

## 2016-11-03 DIAGNOSIS — I420 Dilated cardiomyopathy: Secondary | ICD-10-CM | POA: Insufficient documentation

## 2016-11-03 NOTE — Progress Notes (Signed)
Chief Complaint  Patient presents with  . 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 I saw her as a new patient for evaluation of atrial fibrillation and severe MR on 07/03/12. She was diagnosed with atrial fibrillation April of 2014 as well as severe MR and TR. LV function has always been normal. She had a TEE guided cardioversion in May 2016 with restoration of sinus rhythm. Her MR was noted to be central and severe. The LV cavity was not dilated. LV systolic function was normal. There was moderate to severe TR as well. She was felt to be symptomatic from her mitral valve disease. Cardiac cath on 09/05/12 with mild CAD. She underwent placement of mitral valve ring and tricuspid valve ring as well as MAZE procedure per Dr. Roxy Manns on 10/11/12. Post-operative course complicated by recurrence of atrial fibrillation with bradycardia. She was seen by Dr. Caryl Comes as an EP consult and pacemaker not recommended. She was discharged home on amiodarone and coumadin.  Carotid artery dopplers June 2015 with mild bilateral disease. Last echo June 2017 with normal LV function, mild aortic stenosis, functional repair of MV and TV.   She is here today for follow up. The patient denies any chest pain, dyspnea, palpitations, lower extremity edema, orthopnea, PND, dizziness, near syncope or syncope. Echo today is pending official read.    Primary Care Physician: Merrilee Seashore, MD   Past Medical History:  Diagnosis Date  . Arthritis    knees  . Atrial fibrillation ([place])   . Cataracts, bilateral   . Diabetes mellitus   . GERD (gastroesophageal reflux disease)   . Hypertension   . Mitral regurgitation   . PONV (postoperative nausea and vomiting)   . S/P Maze operation for atrial fibrillation  10/11/2012   Complete bilateral atrial lesion set using bipolar radiofrequency and cryothermy ablation with clipping of LA appendage  . 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   2mm Sorin Memo 3D ring annuloplasty  . 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   36mm Sorin Memo 3D ring annuloplasty with 26 mm Edwards mc3 tricuspid ring annuloplasty   . Shortness of breath    with activity  . Stroke [facility])    mini stroke with vision problems  . Tricuspid regurgitation     Past Surgical History:  Procedure Laterality Date  . CARDIAC CATHETERIZATION  09-05-12  . CARDIOVERSION N/A 07/31/2012   Procedure: CARDIOVERSION;  Surgeon: Josue Hector, MD;  Location: [place];  Service: Cardiovascular;  Laterality: N/A;  . CESAREAN SECTION     x 2  . COLONOSCOPY WITH PROPOFOL N/A 11/28/2014   Procedure: COLONOSCOPY WITH PROPOFOL;  Surgeon: Carol Ada, MD;  [facility];  Service: Endoscopy;  Laterality: N/A;  . FLEXIBLE SIGMOIDOSCOPY N/A 12/18/2015   Procedure: FLEXIBLE SIGMOIDOSCOPY;  Surgeon: Carol Ada, MD;  [facility];  Service: Endoscopy;  Laterality: N/A;  . INTRAOPERATIVE TRANSESOPHAGEAL ECHOCARDIOGRAM N/A 10/11/2012   Procedure: INTRAOPERATIVE TRANSESOPHAGEAL ECHOCARDIOGRAM;  Surgeon: Rexene Alberts, MD;  Location: [place];  Service: Open Heart Surgery;  Laterality: N/A;  . MASTECTOMY Left 1982  . MAZE N/A 10/11/2012   Procedure: MAZE;  Surgeon: Rexene Alberts, MD;  Location: [place];  Service: Open Heart Surgery;  Laterality: N/A;  . MITRAL VALVE REPAIR N/A 10/11/2012  Procedure: MITRAL VALVE REPAIR (MVR);  Surgeon: Rexene Alberts, MD;  Location: [place];  Service: Open Heart Surgery;  Laterality: N/A;  . TEE WITHOUT CARDIOVERSION N/A 07/31/2012   Procedure: TRANSESOPHAGEAL ECHOCARDIOGRAM (TEE);  Surgeon: Josue Hector, MD;  Location: [facility] ENDOSCOPY;  Service: Cardiovascular;  Laterality: N/A;  . TRICUSPID VALVE REPLACEMENT N/A 10/11/2012   Procedure: TRICUSPID VALVE REPAIR;  Surgeon:  Rexene Alberts, MD;  Location: [place];  Service: Open Heart Surgery;  Laterality: N/A;  . TUBAL LIGATION      Current Outpatient Prescriptions  Medication Sig Dispense Refill  . acetaminophen (TYLENOL) 325 MG tablet Take 650 mg by mouth every 6 (six) hours as needed for moderate pain or headache.    Marland Kitchen amLODipine (NORVASC) 5 MG tablet Take 1 tablet (5 mg total) by mouth daily. 180 tablet 3  . calcium carbonate (OS-CAL) 600 MG TABS tablet Take 1,400 mg by mouth daily.     . clonazePAM (KLONOPIN) 0.5 MG tablet Take 0.5 mg by mouth at bedtime.  2  . glipiZIDE (GLUCOTROL XL) 10 MG 24 hr tablet Take 10 mg by mouth daily with breakfast.    . Insulin Detemir (LEVEMIR FLEXPEN) 100 UNIT/ML Pen Inject 22-40 Units into the skin 2 (two) times daily. 40 units in the morning and 22 units at night    . insulin lispro (HUMALOG KWIKPEN) 100 UNIT/ML SOPN Inject 10-15 Units into the skin daily before supper. Sliding scale    . losartan (COZAAR) 100 MG tablet Take 1 tablet (100 mg total) by mouth daily. 30 tablet 1  . lovastatin (MEVACOR) 40 MG tablet TAKE 1 TABLET BY MOUTH DAILY AT BEDTIME 30 tablet 5  . Multiple Vitamin (MULTIVITAMIN) tablet Take 1 tablet by mouth every morning.     Marland Kitchen omeprazole (PRILOSEC) 40 MG capsule Take 40 mg by mouth every morning.     . ONE TOUCH ULTRA TEST test strip 1 each by Other route 3 (three) times daily.   2  . venlafaxine XR (EFFEXOR-XR) 150 MG 24 hr capsule Take 150 mg by mouth every morning.     . venlafaxine XR (EFFEXOR-XR) 75 MG 24 hr capsule Take 75 mg by mouth every morning.    . warfarin (COUMADIN) 3 MG tablet Take 3 mg by mouth as directed. Take by mouth as directed by the coumadin clinic     No current facility-administered medications for this visit.     Allergies  Allergen Reactions  . Ultram [Tramadol] Nausea And Vomiting    Social History   Social History  . Marital status: Widowed    Spouse name: N/A  . Number of children: 3  . Years of education: N/A     Occupational History  . Retired-[employer]    Social History Main Topics  . Smoking status: Never Smoker  . Smokeless tobacco: Never Used  . Alcohol use No  . Drug use: No  . Sexual activity: No   Other Topics Concern  . Not on file   Social History Narrative  . No narrative on file    Family History  Problem Relation Age of Onset  . Cancer Father        prostate  . Hypertension Father   . Heart attack Paternal Grandfather   . Stroke Neg Hx     Review of Systems:  As stated in the HPI and otherwise negative.   BP 128/60   Pulse 74   Ht 5\' 6"  (1.676 m)  Wt 165 lb (74.8 kg)   SpO2 95%   BMI 26.63 kg/m   Physical Examination:  General: Well developed, well nourished, NAD  HEENT: OP clear, mucus membranes moist  SKIN: warm, dry. No rashes. Neuro: No focal deficits  Musculoskeletal: Muscle strength 5/5 all ext  Psychiatric: Mood and affect normal  Neck: No JVD, no carotid bruits, no thyromegaly, no lymphadenopathy.  Lungs:Clear bilaterally, no wheezes, rhonci, crackles Cardiovascular: Regular rate and rhythm. Systolic murmur. No gallops or rubs. Abdomen:Soft. Bowel sounds present. Non-tender.  Extremities: No lower extremity edema. Pulses are 2 + in the bilateral DP/PT.  Echo June 2017:  Left ventricle: The cavity size was normal. Wall thickness was   normal. Systolic function was normal. The estimated ejection   fraction was in the range of 55% to 60%. Wall motion was normal;   there were no regional wall motion abnormalities. Doppler   parameters are consistent with high ventricular filling pressure. - Aortic valve: Valve mobility was restricted. There was mild to   moderate stenosis. Valve area (VTI): 1.38 cm^2. Valve area   (Vmax): 1.41 cm^2. Valve area (Vmean): 1.28 cm^2. - Mitral valve: Calcified annulus. Prior procedures included   surgical repair. The findings are consistent with mild stenosis.   Valve area by pressure half-time:  1.52 cm^2. Valve area by   continuity equation (using LVOT flow): 1.46 cm^2. - Left atrium: The atrium was moderately dilated. - Tricuspid valve: Prior procedures included surgical repair.  EKG:  EKG is not ordered today. The ekg ordered today demonstrates   Recent Labs: 09/21/2016: BUN 27; Creatinine, Ser 0.83; Hemoglobin 13.5; Platelets 266; Potassium 4.4; Sodium 141; TSH 4.100   Lipid Panel No results found for: CHOL, TRIG, HDL, CHOLHDL, VLDL, LDLCALC, LDLDIRECT   Wt Readings from Last 3 Encounters:  11/03/16 165 lb (74.8 kg)  10/06/16 159 lb 1.9 oz (72.2 kg)  09/21/16 161 lb (73 kg)     Other studies Reviewed: Additional studies/ records that were reviewed today include: . Review of the above records demonstrates:    Assessment and Plan:   1. Atrial fibrillation, paroxysmal: She appears to be in sinus today. Will continue coumadin given prior TIA. Xarelto was changed to coumadin at the time of her valve surgery.    2. Mitral regurgitation: She is s/p MV ring in 2014, stable by echo 2017.    3. Tricuspid regurgitation: She is s/p TV ring in 2014. Stable by echo in 2017.   4. CAD without angina: Mild CAD by cath in 2014. Continue ASA and statin.   5.  Aortic stenosis: Moderate by echo today on my review. Official read pending. Repeat echo one year.    Current medicines are reviewed at length with the patient today.  The patient does not have concerns regarding medicines.  The following changes have been made:  no change  Labs/ tests ordered today include:   No orders of the defined types were placed in this encounter.   Disposition:   FU with me in 12  months  Signed, Lauree Chandler, MD 11/03/2016 10:48 AM    [facility] [place], [place], [place]  [address] Phone: [phone]; Fax: [phone]

## 2016-11-03 NOTE — Patient Instructions (Signed)
Medication Instructions:  Your physician recommends that you continue on your current medications as directed. Please refer to the Current Medication list given to you today.   Labwork: none  Testing/Procedures: Your physician has requested that you have an echocardiogram. Echocardiography is a painless test that uses sound waves to create images of your heart. It provides your doctor with information about the size and shape of your heart and how well your heart's chambers and valves are working. This procedure takes approximately one hour. There are no restrictions for this procedure. To be done in one year.    Follow-Up: Your physician recommends that you schedule a follow-up appointment in: 12 months.  (week or 2 after echocardiogram). Please call our office in about 9 months to schedule this appointment.    Any Other Special Instructions Will Be Listed Below (If Applicable).     If you need a refill on your cardiac medications before your next appointment, please call your pharmacy.

## 2016-11-10 NOTE — Telephone Encounter (Signed)
Closed encounter °

## 2016-12-01 DIAGNOSIS — I4891 Unspecified atrial fibrillation: Secondary | ICD-10-CM | POA: Diagnosis not present

## 2016-12-01 DIAGNOSIS — Z7901 Long term (current) use of anticoagulants: Secondary | ICD-10-CM | POA: Diagnosis not present

## 2017-01-09 DIAGNOSIS — I10 Essential (primary) hypertension: Secondary | ICD-10-CM | POA: Diagnosis not present

## 2017-01-09 DIAGNOSIS — I4891 Unspecified atrial fibrillation: Secondary | ICD-10-CM | POA: Diagnosis not present

## 2017-01-09 DIAGNOSIS — Z7901 Long term (current) use of anticoagulants: Secondary | ICD-10-CM | POA: Diagnosis not present

## 2017-01-13 DIAGNOSIS — I4891 Unspecified atrial fibrillation: Secondary | ICD-10-CM | POA: Diagnosis not present

## 2017-01-13 DIAGNOSIS — E782 Mixed hyperlipidemia: Secondary | ICD-10-CM | POA: Diagnosis not present

## 2017-01-13 DIAGNOSIS — Z0000 Encounter for general adult medical examination without abnormal findings: Secondary | ICD-10-CM | POA: Diagnosis not present

## 2017-01-13 DIAGNOSIS — M810 Age-related osteoporosis without current pathological fracture: Secondary | ICD-10-CM | POA: Diagnosis not present

## 2017-01-13 DIAGNOSIS — N390 Urinary tract infection, site not specified: Secondary | ICD-10-CM | POA: Diagnosis not present

## 2017-01-13 DIAGNOSIS — Z23 Encounter for immunization: Secondary | ICD-10-CM | POA: Diagnosis not present

## 2017-01-13 DIAGNOSIS — E1065 Type 1 diabetes mellitus with hyperglycemia: Secondary | ICD-10-CM | POA: Diagnosis not present

## 2017-01-19 DIAGNOSIS — E1065 Type 1 diabetes mellitus with hyperglycemia: Secondary | ICD-10-CM | POA: Diagnosis not present

## 2017-01-19 DIAGNOSIS — K219 Gastro-esophageal reflux disease without esophagitis: Secondary | ICD-10-CM | POA: Diagnosis not present

## 2017-01-19 DIAGNOSIS — E782 Mixed hyperlipidemia: Secondary | ICD-10-CM | POA: Diagnosis not present

## 2017-01-19 DIAGNOSIS — M810 Age-related osteoporosis without current pathological fracture: Secondary | ICD-10-CM | POA: Diagnosis not present

## 2017-01-19 DIAGNOSIS — I4891 Unspecified atrial fibrillation: Secondary | ICD-10-CM | POA: Diagnosis not present

## 2017-02-02 DIAGNOSIS — H26492 Other secondary cataract, left eye: Secondary | ICD-10-CM | POA: Diagnosis not present

## 2017-02-02 DIAGNOSIS — H52223 Regular astigmatism, bilateral: Secondary | ICD-10-CM | POA: Diagnosis not present

## 2017-02-02 DIAGNOSIS — E119 Type 2 diabetes mellitus without complications: Secondary | ICD-10-CM | POA: Diagnosis not present

## 2017-02-02 DIAGNOSIS — H04123 Dry eye syndrome of bilateral lacrimal glands: Secondary | ICD-10-CM | POA: Diagnosis not present

## 2017-02-13 DIAGNOSIS — H26491 Other secondary cataract, right eye: Secondary | ICD-10-CM | POA: Diagnosis not present

## 2017-03-30 DIAGNOSIS — I4891 Unspecified atrial fibrillation: Secondary | ICD-10-CM | POA: Diagnosis not present

## 2017-03-30 DIAGNOSIS — Z7901 Long term (current) use of anticoagulants: Secondary | ICD-10-CM | POA: Diagnosis not present

## 2017-04-27 DIAGNOSIS — Z7901 Long term (current) use of anticoagulants: Secondary | ICD-10-CM | POA: Diagnosis not present

## 2017-04-27 DIAGNOSIS — I4891 Unspecified atrial fibrillation: Secondary | ICD-10-CM | POA: Diagnosis not present

## 2017-05-25 DIAGNOSIS — Z7901 Long term (current) use of anticoagulants: Secondary | ICD-10-CM | POA: Diagnosis not present

## 2017-05-25 DIAGNOSIS — I4891 Unspecified atrial fibrillation: Secondary | ICD-10-CM | POA: Diagnosis not present

## 2017-05-25 DIAGNOSIS — E1065 Type 1 diabetes mellitus with hyperglycemia: Secondary | ICD-10-CM | POA: Diagnosis not present

## 2017-06-01 DIAGNOSIS — E1065 Type 1 diabetes mellitus with hyperglycemia: Secondary | ICD-10-CM | POA: Diagnosis not present

## 2017-06-01 DIAGNOSIS — I10 Essential (primary) hypertension: Secondary | ICD-10-CM | POA: Diagnosis not present

## 2017-06-01 DIAGNOSIS — I4891 Unspecified atrial fibrillation: Secondary | ICD-10-CM | POA: Diagnosis not present

## 2017-06-01 DIAGNOSIS — E782 Mixed hyperlipidemia: Secondary | ICD-10-CM | POA: Diagnosis not present

## 2017-06-23 DIAGNOSIS — E782 Mixed hyperlipidemia: Secondary | ICD-10-CM | POA: Diagnosis not present

## 2017-06-23 DIAGNOSIS — E1065 Type 1 diabetes mellitus with hyperglycemia: Secondary | ICD-10-CM | POA: Diagnosis not present

## 2017-06-23 DIAGNOSIS — I4891 Unspecified atrial fibrillation: Secondary | ICD-10-CM | POA: Diagnosis not present

## 2017-06-23 DIAGNOSIS — I10 Essential (primary) hypertension: Secondary | ICD-10-CM | POA: Diagnosis not present

## 2017-07-03 DIAGNOSIS — I4891 Unspecified atrial fibrillation: Secondary | ICD-10-CM | POA: Diagnosis not present

## 2017-07-03 DIAGNOSIS — Z7901 Long term (current) use of anticoagulants: Secondary | ICD-10-CM | POA: Diagnosis not present

## 2017-08-10 DIAGNOSIS — I4891 Unspecified atrial fibrillation: Secondary | ICD-10-CM | POA: Diagnosis not present

## 2017-08-10 DIAGNOSIS — Z7901 Long term (current) use of anticoagulants: Secondary | ICD-10-CM | POA: Diagnosis not present

## 2017-09-07 DIAGNOSIS — I4891 Unspecified atrial fibrillation: Secondary | ICD-10-CM | POA: Diagnosis not present

## 2017-09-07 DIAGNOSIS — Z7901 Long term (current) use of anticoagulants: Secondary | ICD-10-CM | POA: Diagnosis not present

## 2017-09-21 DIAGNOSIS — E1065 Type 1 diabetes mellitus with hyperglycemia: Secondary | ICD-10-CM | POA: Diagnosis not present

## 2017-09-21 DIAGNOSIS — Z7901 Long term (current) use of anticoagulants: Secondary | ICD-10-CM | POA: Diagnosis not present

## 2017-09-21 DIAGNOSIS — I4891 Unspecified atrial fibrillation: Secondary | ICD-10-CM | POA: Diagnosis not present

## 2017-10-05 DIAGNOSIS — E1065 Type 1 diabetes mellitus with hyperglycemia: Secondary | ICD-10-CM | POA: Diagnosis not present

## 2017-10-05 DIAGNOSIS — Z23 Encounter for immunization: Secondary | ICD-10-CM | POA: Diagnosis not present

## 2017-10-05 DIAGNOSIS — I4891 Unspecified atrial fibrillation: Secondary | ICD-10-CM | POA: Diagnosis not present

## 2017-10-05 DIAGNOSIS — Z7901 Long term (current) use of anticoagulants: Secondary | ICD-10-CM | POA: Diagnosis not present

## 2017-10-18 DIAGNOSIS — E782 Mixed hyperlipidemia: Secondary | ICD-10-CM | POA: Diagnosis not present

## 2017-10-18 DIAGNOSIS — E1065 Type 1 diabetes mellitus with hyperglycemia: Secondary | ICD-10-CM | POA: Diagnosis not present

## 2017-10-18 DIAGNOSIS — I10 Essential (primary) hypertension: Secondary | ICD-10-CM | POA: Diagnosis not present

## 2017-10-26 DIAGNOSIS — I10 Essential (primary) hypertension: Secondary | ICD-10-CM | POA: Diagnosis not present

## 2017-10-26 DIAGNOSIS — E1065 Type 1 diabetes mellitus with hyperglycemia: Secondary | ICD-10-CM | POA: Diagnosis not present

## 2017-10-26 DIAGNOSIS — I4891 Unspecified atrial fibrillation: Secondary | ICD-10-CM | POA: Diagnosis not present

## 2017-10-26 DIAGNOSIS — E782 Mixed hyperlipidemia: Secondary | ICD-10-CM | POA: Diagnosis not present

## 2017-10-30 ENCOUNTER — Encounter: Payer: Self-pay | Admitting: Cardiovascular Disease

## 2017-10-31 ENCOUNTER — Telehealth (HOSPITAL_COMMUNITY): Payer: Self-pay | Admitting: Radiology

## 2017-10-31 NOTE — Telephone Encounter (Signed)
Left a message on patient's answering machine. I have sent an in-basket with Dr. Angelena Form regarding the date of the echocardiogram appointment in relation to his office appointment. I stated I will call back upon hearing from Dr. Angelena Form.

## 2017-11-02 ENCOUNTER — Other Ambulatory Visit (HOSPITAL_COMMUNITY): Payer: Medicare Other

## 2017-11-09 ENCOUNTER — Other Ambulatory Visit (HOSPITAL_COMMUNITY): Payer: Medicare Other

## 2017-11-13 ENCOUNTER — Other Ambulatory Visit (HOSPITAL_COMMUNITY): Payer: Medicare Other

## 2017-11-16 ENCOUNTER — Other Ambulatory Visit: Payer: Self-pay

## 2017-11-16 ENCOUNTER — Ambulatory Visit (HOSPITAL_COMMUNITY): Payer: Medicare Other | Attending: Cardiovascular Disease

## 2017-11-16 DIAGNOSIS — I351 Nonrheumatic aortic (valve) insufficiency: Secondary | ICD-10-CM | POA: Diagnosis not present

## 2017-11-16 DIAGNOSIS — I361 Nonrheumatic tricuspid (valve) insufficiency: Secondary | ICD-10-CM | POA: Diagnosis not present

## 2017-11-16 DIAGNOSIS — Z7901 Long term (current) use of anticoagulants: Secondary | ICD-10-CM | POA: Diagnosis not present

## 2017-11-16 DIAGNOSIS — I350 Nonrheumatic aortic (valve) stenosis: Secondary | ICD-10-CM

## 2017-11-16 DIAGNOSIS — I5030 Unspecified diastolic (congestive) heart failure: Secondary | ICD-10-CM | POA: Diagnosis not present

## 2017-11-16 DIAGNOSIS — Z87891 Personal history of nicotine dependence: Secondary | ICD-10-CM | POA: Diagnosis not present

## 2017-11-16 DIAGNOSIS — I110 Hypertensive heart disease with heart failure: Secondary | ICD-10-CM | POA: Diagnosis not present

## 2017-11-16 DIAGNOSIS — E119 Type 2 diabetes mellitus without complications: Secondary | ICD-10-CM | POA: Insufficient documentation

## 2017-11-16 DIAGNOSIS — E1065 Type 1 diabetes mellitus with hyperglycemia: Secondary | ICD-10-CM | POA: Diagnosis not present

## 2017-11-16 DIAGNOSIS — I4891 Unspecified atrial fibrillation: Secondary | ICD-10-CM | POA: Diagnosis not present

## 2017-11-16 DIAGNOSIS — I340 Nonrheumatic mitral (valve) insufficiency: Secondary | ICD-10-CM | POA: Diagnosis not present

## 2017-12-05 ENCOUNTER — Other Ambulatory Visit (HOSPITAL_COMMUNITY): Payer: Medicare Other

## 2017-12-06 ENCOUNTER — Ambulatory Visit: Payer: Medicare Other | Admitting: Physician Assistant

## 2017-12-07 DIAGNOSIS — E1065 Type 1 diabetes mellitus with hyperglycemia: Secondary | ICD-10-CM | POA: Diagnosis not present

## 2017-12-07 DIAGNOSIS — Z7901 Long term (current) use of anticoagulants: Secondary | ICD-10-CM | POA: Diagnosis not present

## 2017-12-07 DIAGNOSIS — I4891 Unspecified atrial fibrillation: Secondary | ICD-10-CM | POA: Diagnosis not present

## 2017-12-20 ENCOUNTER — Ambulatory Visit: Payer: Medicare Other | Admitting: Cardiovascular Disease

## 2018-01-15 DIAGNOSIS — E1065 Type 1 diabetes mellitus with hyperglycemia: Secondary | ICD-10-CM | POA: Diagnosis not present

## 2018-01-15 DIAGNOSIS — I4891 Unspecified atrial fibrillation: Secondary | ICD-10-CM | POA: Diagnosis not present

## 2018-01-15 DIAGNOSIS — R6889 Other general symptoms and signs: Secondary | ICD-10-CM | POA: Diagnosis not present

## 2018-01-15 DIAGNOSIS — Z7901 Long term (current) use of anticoagulants: Secondary | ICD-10-CM | POA: Diagnosis not present

## 2018-01-24 DIAGNOSIS — I4891 Unspecified atrial fibrillation: Secondary | ICD-10-CM | POA: Diagnosis not present

## 2018-01-24 DIAGNOSIS — E782 Mixed hyperlipidemia: Secondary | ICD-10-CM | POA: Diagnosis not present

## 2018-01-24 DIAGNOSIS — E1065 Type 1 diabetes mellitus with hyperglycemia: Secondary | ICD-10-CM | POA: Diagnosis not present

## 2018-01-24 DIAGNOSIS — N390 Urinary tract infection, site not specified: Secondary | ICD-10-CM | POA: Diagnosis not present

## 2018-02-01 DIAGNOSIS — E1065 Type 1 diabetes mellitus with hyperglycemia: Secondary | ICD-10-CM | POA: Diagnosis not present

## 2018-02-01 DIAGNOSIS — Z23 Encounter for immunization: Secondary | ICD-10-CM | POA: Diagnosis not present

## 2018-02-01 DIAGNOSIS — Z0000 Encounter for general adult medical examination without abnormal findings: Secondary | ICD-10-CM | POA: Diagnosis not present

## 2018-02-01 DIAGNOSIS — I10 Essential (primary) hypertension: Secondary | ICD-10-CM | POA: Diagnosis not present

## 2018-02-19 DIAGNOSIS — Z7901 Long term (current) use of anticoagulants: Secondary | ICD-10-CM | POA: Diagnosis not present

## 2018-02-19 DIAGNOSIS — I4891 Unspecified atrial fibrillation: Secondary | ICD-10-CM | POA: Diagnosis not present

## 2018-02-19 DIAGNOSIS — R6889 Other general symptoms and signs: Secondary | ICD-10-CM | POA: Diagnosis not present

## 2018-02-28 ENCOUNTER — Ambulatory Visit: Payer: Medicare Other | Admitting: Cardiovascular Disease

## 2018-02-28 DIAGNOSIS — N3001 Acute cystitis with hematuria: Secondary | ICD-10-CM | POA: Diagnosis not present

## 2018-02-28 DIAGNOSIS — R339 Retention of urine, unspecified: Secondary | ICD-10-CM | POA: Diagnosis not present

## 2018-02-28 DIAGNOSIS — B954 Other streptococcus as the cause of diseases classified elsewhere: Secondary | ICD-10-CM | POA: Diagnosis not present

## 2018-02-28 DIAGNOSIS — R8290 Unspecified abnormal findings in urine: Secondary | ICD-10-CM | POA: Diagnosis not present

## 2018-02-28 DIAGNOSIS — Z09 Encounter for follow-up examination after completed treatment for conditions other than malignant neoplasm: Secondary | ICD-10-CM | POA: Diagnosis not present

## 2018-02-28 DIAGNOSIS — R3914 Feeling of incomplete bladder emptying: Secondary | ICD-10-CM | POA: Diagnosis not present

## 2018-02-28 DIAGNOSIS — N8111 Cystocele, midline: Secondary | ICD-10-CM | POA: Diagnosis not present

## 2018-03-08 DIAGNOSIS — R339 Retention of urine, unspecified: Secondary | ICD-10-CM | POA: Diagnosis not present

## 2018-03-12 DIAGNOSIS — R6889 Other general symptoms and signs: Secondary | ICD-10-CM | POA: Diagnosis not present

## 2018-03-12 DIAGNOSIS — Z7901 Long term (current) use of anticoagulants: Secondary | ICD-10-CM | POA: Diagnosis not present

## 2018-03-12 DIAGNOSIS — I4891 Unspecified atrial fibrillation: Secondary | ICD-10-CM | POA: Diagnosis not present

## 2018-04-03 ENCOUNTER — Ambulatory Visit: Payer: Medicare Other | Admitting: Physician Assistant

## 2018-04-11 DIAGNOSIS — I10 Essential (primary) hypertension: Secondary | ICD-10-CM | POA: Diagnosis not present

## 2018-04-11 DIAGNOSIS — E1121 Type 2 diabetes mellitus with diabetic nephropathy: Secondary | ICD-10-CM | POA: Diagnosis not present

## 2018-04-11 DIAGNOSIS — Z7901 Long term (current) use of anticoagulants: Secondary | ICD-10-CM | POA: Diagnosis not present

## 2018-04-11 DIAGNOSIS — I4891 Unspecified atrial fibrillation: Secondary | ICD-10-CM | POA: Diagnosis not present

## 2018-04-25 DIAGNOSIS — I350 Nonrheumatic aortic (valve) stenosis: Secondary | ICD-10-CM | POA: Insufficient documentation

## 2018-04-25 NOTE — Progress Notes (Deleted)
Cardiology Office Note    Date:  04/25/2018   [name], DOB 03-Dec-1940, MRN [id number]  PCP:  Merrilee Seashore, MD  Cardiologist: Lauree Chandler, MD EPS: None  No chief complaint on file.   History of Present Illness:  Rachel Santos is a 78 y.o. female with history of mitral valve ring and tricuspid valve ring as well as maze procedure 10/11/2012.  Postop course complicated by atrial fibrillation bradycardia.  Patient mild nonobstructive CAD on cath 09/05/2012.  Also has mild bilateral carotid disease, last echo 10/2017 normal LV function with moderate aortic stenosis with gradient increased from 27 mmHg to 36 mmHg.  Still considered moderate.  Repaired mitral and tricuspid valve gradients were stable.   Past Medical History:  Diagnosis Date  . Arthritis    knees  . Atrial fibrillation ([place])   . Cataracts, bilateral   . Diabetes mellitus   . GERD (gastroesophageal reflux disease)   . Hypertension   . Mitral regurgitation   . PONV (postoperative nausea and vomiting)   . S/P Maze operation for atrial fibrillation 10/11/2012   Complete bilateral atrial lesion set using bipolar radiofrequency and cryothermy ablation with clipping of LA appendage  . 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   72mm Sorin Memo 3D ring annuloplasty  . 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   32mm Sorin Memo 3D ring annuloplasty with 26 mm Edwards mc3 tricuspid ring annuloplasty   . Shortness of breath    with activity  . Stroke [facility])    mini stroke with vision problems  . Tricuspid regurgitation     Past Surgical History:  Procedure Laterality Date  . CARDIAC CATHETERIZATION  09-05-12  . CARDIOVERSION N/A 07/31/2012   Procedure: CARDIOVERSION;  Surgeon: Josue Hector, MD;  Location: [place];  Service: Cardiovascular;  Laterality: N/A;  . CESAREAN SECTION     x 2  . COLONOSCOPY WITH PROPOFOL N/A 11/28/2014   Procedure: COLONOSCOPY WITH PROPOFOL;  Surgeon: Carol Ada, MD;   [facility];  Service: Endoscopy;  Laterality: N/A;  . FLEXIBLE SIGMOIDOSCOPY N/A 12/18/2015   Procedure: FLEXIBLE SIGMOIDOSCOPY;  Surgeon: Carol Ada, MD;  [facility];  Service: Endoscopy;  Laterality: N/A;  . INTRAOPERATIVE TRANSESOPHAGEAL ECHOCARDIOGRAM N/A 10/11/2012   Procedure: INTRAOPERATIVE TRANSESOPHAGEAL ECHOCARDIOGRAM;  Surgeon: Rexene Alberts, MD;  Location: [place];  Service: Open Heart Surgery;  Laterality: N/A;  . MASTECTOMY Left 1982  . MAZE N/A 10/11/2012   Procedure: MAZE;  Surgeon: Rexene Alberts, MD;  Location: [place];  Service: Open Heart Surgery;  Laterality: N/A;  . MITRAL VALVE REPAIR N/A 10/11/2012   Procedure: MITRAL VALVE REPAIR (MVR);  Surgeon: Rexene Alberts, MD;  Location: [place];  Service: Open Heart Surgery;  Laterality: N/A;  . TEE WITHOUT CARDIOVERSION N/A 07/31/2012   Procedure: TRANSESOPHAGEAL ECHOCARDIOGRAM (TEE);  Surgeon: Josue Hector, MD;  Location: [facility] ENDOSCOPY;  Service: Cardiovascular;  Laterality: N/A;  . TRICUSPID VALVE REPLACEMENT N/A 10/11/2012   Procedure: TRICUSPID VALVE REPAIR;  Surgeon: Rexene Alberts, MD;  Location: [place];  Service: Open Heart Surgery;  Laterality: N/A;  . TUBAL LIGATION      Current Medications: No outpatient medications have been marked as taking for the 05/01/18 encounter (Appointment) with Imogene Burn, PA-C.     Allergies:   Ultram [tramadol]   Social History   Socioeconomic History  . Marital status: Widowed    Spouse name: Not on file  . Number of children: 3  .  Years of education: Not on file  . Highest education level: Not on file  Occupational History  . Occupation: Air cabin crew  Social Needs  . Financial resource strain: Not on file  . Food insecurity:    Worry: Not on file    Inability: Not on file  . Transportation needs:    Medical: Not on file    Non-medical: Not on file  Tobacco Use  . Smoking status: Never Smoker  . Smokeless tobacco: Never  Used  Substance and Sexual Activity  . Alcohol use: No  . Drug use: No  . Sexual activity: Never  Lifestyle  . Physical activity:    Days per week: Not on file    Minutes per session: Not on file  . Stress: Not on file  Relationships  . Social connections:    Talks on phone: Not on file    Gets together: Not on file    Attends religious service: Not on file    Active member of club or organization: Not on file    Attends meetings of clubs or organizations: Not on file    Relationship status: Not on file  Other Topics Concern  . Not on file  Social History Narrative  . Not on file     Family History:  The patient's ***family history includes Cancer in her father; Heart attack in her paternal grandfather; Hypertension in her father.   ROS:   Please see the history of present illness.    ROS All other systems reviewed and are negative.   PHYSICAL EXAM:   VS:  There were no vitals taken for this visit.  Physical Exam  GEN: Well nourished, well developed, in no acute distress  HEENT: normal  Neck: no JVD, carotid bruits, or masses Cardiac:RRR; no murmurs, rubs, or gallops  Respiratory:  clear to auscultation bilaterally, normal work of breathing GI: soft, nontender, nondistended, + BS Ext: without cyanosis, clubbing, or edema, Good distal pulses bilaterally MS: no deformity or atrophy  Skin: warm and dry, no rash Neuro:  Alert and Oriented x 3, Strength and sensation are intact Psych: euthymic mood, full affect  Wt Readings from Last 3 Encounters:  11/03/16 165 lb (74.8 kg)  10/06/16 159 lb 1.9 oz (72.2 kg)  09/21/16 161 lb (73 kg)      Studies/Labs Reviewed:   EKG:  EKG is*** ordered today.  The ekg ordered today demonstrates ***  Recent Labs: No results found for requested labs within last 8760 hours.   Lipid Panel No results found for: CHOL, TRIG, HDL, CHOLHDL, VLDL, LDLCALC, LDLDIRECT  Additional studies/ records that were reviewed today include:  2D  echo 11/16/2017  Study Conclusions   - Left ventricle: The cavity size was normal. There was moderate   concentric hypertrophy. Systolic function was normal. The   estimated ejection fraction was in the range of 60% to 65%. Wall   motion was normal; there were no regional wall motion   abnormalities. Features are consistent with a pseudonormal left   ventricular filling pattern, with concomitant abnormal relaxation   and increased filling pressure (grade 2 diastolic dysfunction).   Doppler parameters are consistent with high ventricular filling   pressure. - Aortic valve: Valve mobility was restricted. There was moderate   stenosis. There was mild regurgitation. - Mitral valve: Prior procedures included surgical repair. The   findings are consistent with moderate stenosis. There was no   regurgitation. Pressure half-time: 120 ms. Valve area by  continuity equation (using LVOT flow): 1.73 cm^2. - Left atrium: The atrium was severely dilated. - Right ventricle: The cavity size was normal. Wall thickness was   normal. Systolic function was normal. - Atrial septum: No defect or patent foramen ovale was identified   by color flow Doppler. - Tricuspid valve: Prior procedures included surgical repair.   Impressions:   - Compared with the echo 10/2016, repaired mitral and tricuspid   valve gradients are stable. Mean aortic valve gradient has   increased from 27 mmHg to 36 mmHg.   Carotid Dopplers 2015 1 to 39% bilateral stenosis  ASSESSMENT:    1. S/P MVR (mitral valve repair)   2. S/P TVR (tricuspid valve repair)   3. S/P Maze operation for atrial fibrillation   4. Aortic valve stenosis, etiology of cardiac valve disease unspecified      PLAN:  In order of problems listed above:  Status post MVR and TVR with maze procedure in 2014  Aortic stenosis felt to be moderate on echo 10/2017 gradient increased to 36 millimeters of mercury  Status post Maze procedure for atrial  fibrillation  Medication Adjustments/Labs and Tests Ordered: Current medicines are reviewed at length with the patient today.  Concerns regarding medicines are outlined above.  Medication changes, Labs and Tests ordered today are listed in the Patient Instructions below. There are no Patient Instructions on file for this visit.   Sumner Boast, PA-C  04/25/2018 3:30 PM    [facility] [place], [place], [place]  [address] Phone: [phone]; Fax: [phone]

## 2018-05-01 ENCOUNTER — Ambulatory Visit: Payer: Medicare Other | Admitting: Physician Assistant

## 2018-05-02 DIAGNOSIS — E1065 Type 1 diabetes mellitus with hyperglycemia: Secondary | ICD-10-CM | POA: Diagnosis not present

## 2018-05-09 DIAGNOSIS — I4891 Unspecified atrial fibrillation: Secondary | ICD-10-CM | POA: Diagnosis not present

## 2018-05-09 DIAGNOSIS — Z7901 Long term (current) use of anticoagulants: Secondary | ICD-10-CM | POA: Diagnosis not present

## 2018-05-09 DIAGNOSIS — I10 Essential (primary) hypertension: Secondary | ICD-10-CM | POA: Diagnosis not present

## 2018-05-09 DIAGNOSIS — E1121 Type 2 diabetes mellitus with diabetic nephropathy: Secondary | ICD-10-CM | POA: Diagnosis not present

## 2018-05-25 DIAGNOSIS — E1121 Type 2 diabetes mellitus with diabetic nephropathy: Secondary | ICD-10-CM | POA: Diagnosis not present

## 2018-05-25 DIAGNOSIS — Z7901 Long term (current) use of anticoagulants: Secondary | ICD-10-CM | POA: Diagnosis not present

## 2018-05-25 DIAGNOSIS — J069 Acute upper respiratory infection, unspecified: Secondary | ICD-10-CM | POA: Diagnosis not present

## 2018-05-25 DIAGNOSIS — I4891 Unspecified atrial fibrillation: Secondary | ICD-10-CM | POA: Diagnosis not present

## 2018-06-08 DIAGNOSIS — E1121 Type 2 diabetes mellitus with diabetic nephropathy: Secondary | ICD-10-CM | POA: Diagnosis not present

## 2018-06-08 DIAGNOSIS — Z7901 Long term (current) use of anticoagulants: Secondary | ICD-10-CM | POA: Diagnosis not present

## 2018-06-08 DIAGNOSIS — E782 Mixed hyperlipidemia: Secondary | ICD-10-CM | POA: Diagnosis not present

## 2018-06-08 DIAGNOSIS — I10 Essential (primary) hypertension: Secondary | ICD-10-CM | POA: Diagnosis not present

## 2018-06-08 DIAGNOSIS — I4891 Unspecified atrial fibrillation: Secondary | ICD-10-CM | POA: Diagnosis not present

## 2018-06-08 DIAGNOSIS — E1065 Type 1 diabetes mellitus with hyperglycemia: Secondary | ICD-10-CM | POA: Diagnosis not present

## 2018-06-12 ENCOUNTER — Telehealth: Payer: Self-pay | Admitting: Internal Medicine

## 2018-06-12 NOTE — Telephone Encounter (Signed)
Called pt    She says she is doing OK Due for f/u tomorrow    With corona virus would recomm rescheduling to later this spring with viral situration improves Will call pt to reschedule Pt understands   Will call if problems develop

## 2018-06-13 ENCOUNTER — Ambulatory Visit: Payer: Medicare Other | Admitting: Physician Assistant

## 2018-06-13 DIAGNOSIS — E1065 Type 1 diabetes mellitus with hyperglycemia: Secondary | ICD-10-CM | POA: Diagnosis not present

## 2018-06-13 DIAGNOSIS — I4891 Unspecified atrial fibrillation: Secondary | ICD-10-CM | POA: Diagnosis not present

## 2018-06-13 DIAGNOSIS — E782 Mixed hyperlipidemia: Secondary | ICD-10-CM | POA: Diagnosis not present

## 2018-06-19 DIAGNOSIS — I4891 Unspecified atrial fibrillation: Secondary | ICD-10-CM | POA: Diagnosis not present

## 2018-06-19 DIAGNOSIS — E1121 Type 2 diabetes mellitus with diabetic nephropathy: Secondary | ICD-10-CM | POA: Diagnosis not present

## 2018-06-19 DIAGNOSIS — Z7901 Long term (current) use of anticoagulants: Secondary | ICD-10-CM | POA: Diagnosis not present

## 2018-06-26 DIAGNOSIS — E1121 Type 2 diabetes mellitus with diabetic nephropathy: Secondary | ICD-10-CM | POA: Diagnosis not present

## 2018-06-26 DIAGNOSIS — Z7901 Long term (current) use of anticoagulants: Secondary | ICD-10-CM | POA: Diagnosis not present

## 2018-06-26 DIAGNOSIS — I4891 Unspecified atrial fibrillation: Secondary | ICD-10-CM | POA: Diagnosis not present

## 2018-07-09 NOTE — Telephone Encounter (Signed)
Virtual Visit Pre-Appointment Phone Call  Steps For Call:  1. Confirm consent - "In the setting of the current Covid19 crisis, you are scheduled for a (phone or video) visit with your provider on (date) at (time).  Just as we do with many in-office visits, in order for you to participate in this visit, we must obtain consent.  If you'd like, I can send this to your mychart (if signed up) or email for you to review.  Otherwise, I can obtain your verbal consent now.  All virtual visits are billed to your insurance company just like a normal visit would be.  By agreeing to a virtual visit, we'd like you to understand that the technology does not allow for your provider to perform an examination, and thus may limit your provider's ability to fully assess your condition.  Finally, though the technology is pretty good, we cannot assure that it will always work on either your or our end, and in the setting of a video visit, we may have to convert it to a phone-only visit.  In either situation, we cannot ensure that we have a secure connection.  Are you willing to proceed?"  2. Give patient instructions for WebEx download to smartphone as below if video visit  3. Advise patient to be prepared with any vital sign or heart rhythm information, their current medicines, and a piece of paper and pen handy for any instructions they may receive the day of their visit  4. Inform patient they will receive a phone call 15 minutes prior to their appointment time (may be from unknown caller ID) so they should be prepared to answer  5. Confirm that appointment type is correct in Epic appointment notes (video vs telephone)    TELEPHONE CALL NOTE  Rachel Santos has been deemed a candidate for a follow-up tele-health visit to limit community exposure during the Covid-19 pandemic. I spoke with the patient via phone to ensure availability of phone/video source, confirm preferred email & phone number, and discuss  instructions and expectations.  I reminded Rachel Santos to be prepared with any vital sign and/or heart rhythm information that could potentially be obtained via home monitoring, at the time of her visit. I reminded Rachel Santos to expect a phone call at the time of her visit if her visit.  Did the patient verbally acknowledge consent to treatment? YES  Patient agrees to VIDEO Visit via [place] with Ermalinda Barrios, PA on 4/15  Cleon Gustin, [place] 07/09/2018 10:21 AM   CONSENT FOR TELE-HEALTH VISIT - PLEASE REVIEW  I hereby voluntarily request, consent and authorize [facility] and its employed or contracted physicians, physician assistants, nurse practitioners or other licensed health care professionals (the Practitioner), to provide me with telemedicine health care services (the Services") as deemed necessary by the treating Practitioner. I acknowledge and consent to receive the Services by the Practitioner via telemedicine. I understand that the telemedicine visit will involve communicating with the Practitioner through live audiovisual communication technology and the disclosure of certain medical information by electronic transmission. I acknowledge that I have been given the opportunity to request an in-person assessment or other available alternative prior to the telemedicine visit and am voluntarily participating in the telemedicine visit.  I understand that I have the right to withhold or withdraw my consent to the use of telemedicine in the course of my care at any time, without affecting my right to future care or treatment, and that  the Practitioner or I may terminate the telemedicine visit at any time. I understand that I have the right to inspect all information obtained and/or recorded in the course of the telemedicine visit and may receive copies of available information for a reasonable fee.  I understand that some of the potential risks of receiving the Services via  telemedicine include:   Delay or interruption in medical evaluation due to technological equipment failure or disruption;  Information transmitted may not be sufficient (e.g. poor resolution of images) to allow for appropriate medical decision making by the Practitioner; and/or   In rare instances, security protocols could fail, causing a breach of personal health information.  Furthermore, I acknowledge that it is my responsibility to provide information about my medical history, conditions and care that is complete and accurate to the best of my ability. I acknowledge that Practitioner's advice, recommendations, and/or decision may be based on factors not within their control, such as incomplete or inaccurate data provided by me or distortions of diagnostic images or specimens that may result from electronic transmissions. I understand that the practice of medicine is not an exact science and that Practitioner makes no warranties or guarantees regarding treatment outcomes. I acknowledge that I will receive a copy of this consent concurrently upon execution via email to the email address I last provided but may also request a printed copy by calling the office of [facility].    I understand that my insurance will be billed for this visit.   I have read or had this consent read to me.  I understand the contents of this consent, which adequately explains the benefits and risks of the Services being provided via telemedicine.   I have been provided ample opportunity to ask questions regarding this consent and the Services and have had my questions answered to my satisfaction.  I give my informed consent for the services to be provided through the use of telemedicine in my medical care  By participating in this telemedicine visit I agree to the above.

## 2018-07-10 DIAGNOSIS — I4891 Unspecified atrial fibrillation: Secondary | ICD-10-CM | POA: Diagnosis not present

## 2018-07-10 DIAGNOSIS — E1121 Type 2 diabetes mellitus with diabetic nephropathy: Secondary | ICD-10-CM | POA: Diagnosis not present

## 2018-07-10 DIAGNOSIS — Z7901 Long term (current) use of anticoagulants: Secondary | ICD-10-CM | POA: Diagnosis not present

## 2018-07-11 ENCOUNTER — Other Ambulatory Visit: Payer: Self-pay

## 2018-07-11 ENCOUNTER — Encounter: Payer: Self-pay | Admitting: Physician Assistant

## 2018-07-11 ENCOUNTER — Telehealth: Payer: Self-pay | Admitting: Cardiovascular Disease

## 2018-07-11 ENCOUNTER — Telehealth (INDEPENDENT_AMBULATORY_CARE_PROVIDER_SITE_OTHER): Payer: Medicare Other | Admitting: Physician Assistant

## 2018-07-11 VITALS — Ht 66.0 in | Wt 168.0 lb

## 2018-07-11 DIAGNOSIS — Z9889 Other specified postprocedural states: Secondary | ICD-10-CM

## 2018-07-11 DIAGNOSIS — I350 Nonrheumatic aortic (valve) stenosis: Secondary | ICD-10-CM

## 2018-07-11 DIAGNOSIS — I10 Essential (primary) hypertension: Secondary | ICD-10-CM

## 2018-07-11 DIAGNOSIS — I2510 Atherosclerotic heart disease of native coronary artery without angina pectoris: Secondary | ICD-10-CM

## 2018-07-11 NOTE — Patient Instructions (Addendum)
Medication Instructions:  Your physician recommends that you continue on your current medications as directed. Please refer to the Current Medication list given to you today.  If you need a refill on your cardiac medications before your next appointment, please call your pharmacy.   Lab work: None Ordered  If you have labs (blood work) drawn today and your tests are completely normal, you will receive your results only by: Marland Kitchen MyChart Message (if you have MyChart) OR . A paper copy in the mail If you have any lab test that is abnormal or we need to change your treatment, we will call you to review the results.  Testing/Procedures: Your physician has requested that you have an echocardiogram on 11/23/18 at 1:00 PM. Please arrive at 12:30 PM. Echocardiography is a painless test that uses sound waves to create images of your heart. It provides your doctor with information about the size and shape of your heart and how well your heart's chambers and valves are working. This procedure takes approximately one hour. There are no restrictions for this procedure.    Follow-Up: Follow up with Dr. Angelena Form on 11/29/18 at 3:00 PM.  Any Other Special Instructions Will Be Listed Below (If Applicable).

## 2018-07-11 NOTE — Telephone Encounter (Signed)
New message   Patient states that she would like to do a telephone call today for her appt at 2:00 pm with Rachel Santos. The patient does not have a computer. Please call the patient.

## 2018-07-11 NOTE — Telephone Encounter (Signed)
Called patient and made her aware that she does not need a computer and that she will receive a text message on her cell phone with a link to click on for video. Made patient aware that if she cannot get it to work we will transition to phone call.

## 2018-07-11 NOTE — Progress Notes (Signed)
Virtual Visit via Video Note   This visit type was conducted due to national recommendations for restrictions regarding the COVID-19 Pandemic (e.g. social distancing) in an effort to limit this patient's exposure and mitigate transmission in our community.  Due to her co-morbid illnesses, this patient is at least at moderate risk for complications without adequate follow up.  This format is felt to be most appropriate for this patient at this time.  All issues noted in this document were discussed and addressed.  A limited physical exam was performed with this format.  Please refer to the patient's chart for her consent to telehealth for [facility].   Evaluation Performed:  Follow-up visit  Date:  07/11/2018   [name], DOB Oct 29, 1940, MRN [id number]  Patient Location: Home Provider Location: Home  PCP:  Merrilee Seashore, MD  Cardiologist:  Lauree Chandler, MD  Electrophysiologist:  None   Chief Complaint:  Follow up  History of Present Illness:    Rachel Santos is a 78 y.o. female with with history of mild CAD on cardiac cath 09/05/2012.  She is status post mitral valve ring and tricuspid valve ring as well as maze procedure by Dr. Roxy Manns 2014.  Postop course was complicated by recurrence of atrial fibrillation and bradycardia.  Pacemaker was not recommended by EP.  She was treated with amiodarone and Coumadin.  Hypertension and diabetes mellitus, prior TIA.  Patient last saw Dr. Angelena Form 10/2016 most recent 2D echo 11/16/2017 showed stable repaired mitral and tricuspid valve gradients mean aortic valve gradient has increased from 53mmHg to 36 mmHg consistent with moderate aortic stenosis, normal LVEF 60 to 65% with grade 2 DD.  She gets yearly echoes.  Patient says her heart has been doing well. No chest pain. Chronic dyspnea on exertion that is unchanged. Can't walk because of bad knees. No palpitations. Trouble with diabetes.  The patient does not have  symptoms concerning for COVID-19 infection (fever, chills, cough, or new shortness of breath).    Past Medical History:  Diagnosis Date  . Arthritis    knees  . Atrial fibrillation ([place])   . Cataracts, bilateral   . Diabetes mellitus   . GERD (gastroesophageal reflux disease)   . Hypertension   . Mitral regurgitation   . PONV (postoperative nausea and vomiting)   . S/P Maze operation for atrial fibrillation 10/11/2012   Complete bilateral atrial lesion set using bipolar radiofrequency and cryothermy ablation with clipping of LA appendage  . 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 Shortness of breath    with activity  . Stroke [facility])    mini stroke with vision problems  . Tricuspid regurgitation    Past Surgical History:  Procedure Laterality Date  . CARDIAC CATHETERIZATION  09-05-12  . CARDIOVERSION N/A 07/31/2012   Procedure: CARDIOVERSION;  Surgeon: Josue Hector, MD;  Location: [place];  Service: Cardiovascular;  Laterality: N/A;  . CESAREAN SECTION     x 2  . COLONOSCOPY WITH PROPOFOL N/A 11/28/2014   Procedure: COLONOSCOPY WITH PROPOFOL;  Surgeon: Carol Ada, MD;  Location: [facility];  Service: Endoscopy;  Laterality: N/A;  . FLEXIBLE SIGMOIDOSCOPY N/A 12/18/2015   Procedure: FLEXIBLE SIGMOIDOSCOPY;  Surgeon: Carol Ada, MD;  Location: [facility];  Service: Endoscopy;  Laterality: N/A;  . INTRAOPERATIVE  TRANSESOPHAGEAL ECHOCARDIOGRAM N/A 10/11/2012   Procedure: INTRAOPERATIVE TRANSESOPHAGEAL ECHOCARDIOGRAM;  Surgeon: Rexene Alberts, MD;  Location: [place];  Service: Open Heart Surgery;  Laterality: N/A;  . MASTECTOMY Left 1982  . MAZE N/A 10/11/2012   Procedure: MAZE;  Surgeon: Rexene Alberts, MD;  Location: [place];  Service: Open Heart Surgery;  Laterality: N/A;  . MITRAL VALVE REPAIR N/A 10/11/2012    Procedure: MITRAL VALVE REPAIR (MVR);  Surgeon: Rexene Alberts, MD;  Location: [place];  Service: Open Heart Surgery;  Laterality: N/A;  . TEE WITHOUT CARDIOVERSION N/A 07/31/2012   Procedure: TRANSESOPHAGEAL ECHOCARDIOGRAM (TEE);  Surgeon: Josue Hector, MD;  Location[facility] ENDOSCOPY;  Service: Cardiovascular;  Laterality: N/A;  . TRICUSPID VALVE REPLACEMENT N/A 10/11/2012   Procedure: TRICUSPID VALVE REPAIR;  Surgeon: Rexene Alberts, MD;  Location: [place];  Service: Open Heart Surgery;  Laterality: N/A;  . TUBAL LIGATION       Current Meds  Medication Sig  . acetaminophen (TYLENOL) 325 MG tablet Take 650 mg by mouth every 6 (six) hours as needed for moderate pain or headache.  Marland Kitchen amLODipine (NORVASC) 5 MG tablet Take 1 tablet (5 mg total) by mouth daily.  . calcium carbonate (OS-CAL) 600 MG TABS tablet Take 1,400 mg by mouth daily.   Marland Kitchen glipiZIDE (GLUCOTROL XL) 10 MG 24 hr tablet Take 10 mg by mouth daily with breakfast.  . Insulin Detemir (LEVEMIR FLEXPEN) 100 UNIT/ML Pen Inject 22-40 Units into the skin 2 (two) times daily. 40 units in the morning and 22 units at night  . insulin lispro (HUMALOG KWIKPEN) 100 UNIT/ML SOPN Inject 10-15 Units into the skin daily before supper. Sliding scale  . losartan (COZAAR) 100 MG tablet Take 1 tablet (100 mg total) by mouth daily.  Marland Kitchen lovastatin (MEVACOR) 40 MG tablet TAKE 1 TABLET BY MOUTH DAILY AT BEDTIME  . Multiple Vitamin (MULTIVITAMIN) tablet Take 1 tablet by mouth every morning.   Marland Kitchen omeprazole (PRILOSEC) 40 MG capsule Take 40 mg by mouth every morning.   . ONE TOUCH ULTRA TEST test strip 1 each by Other route 3 (three) times daily.   Marland Kitchen venlafaxine XR (EFFEXOR-XR) 150 MG 24 hr capsule Take 150 mg by mouth every morning.   . venlafaxine XR (EFFEXOR-XR) 75 MG 24 hr capsule Take 75 mg by mouth every morning.  . warfarin (COUMADIN) 3 MG tablet Take 3 mg by mouth as directed. Take by mouth as directed by the coumadin clinic     Allergies:   Ultram [tramadol]    Social History   Tobacco Use  . Smoking status: Never Smoker  . Smokeless tobacco: Never Used  Substance Use Topics  . Alcohol use: No  . Drug use: No     Family Hx: The patient's family history includes Cancer in her father; Heart attack in her paternal grandfather; Hypertension in her father. There is no history of Stroke.  ROS:   Please see the history of present illness.    Review of Systems  Constitution: Negative.  HENT: Negative.   Eyes: Negative.   Cardiovascular: Negative.   Respiratory: Negative.   Hematologic/Lymphatic: Negative.   Musculoskeletal: Positive for arthritis and joint pain.  Gastrointestinal: Negative.   Genitourinary: Negative.   Neurological: Negative.     All other systems reviewed and are negative.   Prior CV studies:   The following studies were reviewed today:  2D echo 8/22/2019Study Conclusions   - Left ventricle: The cavity size was normal. There  was moderate   concentric hypertrophy. Systolic function was normal. The   estimated ejection fraction was in the range of 60% to 65%. Wall   motion was normal; there were no regional wall motion   abnormalities. Features are consistent with a pseudonormal left   ventricular filling pattern, with concomitant abnormal relaxation   and increased filling pressure (grade 2 diastolic dysfunction).   Doppler parameters are consistent with high ventricular filling   pressure. - Aortic valve: Valve mobility was restricted. There was moderate   stenosis. There was mild regurgitation. - Mitral valve: Prior procedures included surgical repair. The   findings are consistent with moderate stenosis. There was no   regurgitation. Pressure half-time: 120 ms. Valve area by   continuity equation (using LVOT flow): 1.73 cm^2. - Left atrium: The atrium was severely dilated. - Right ventricle: The cavity size was normal. Wall thickness was   normal. Systolic function was normal. - Atrial septum: No defect  or patent foramen ovale was identified   by color flow Doppler. - Tricuspid valve: Prior procedures included surgical repair.   Impressions:   - Compared with the echo 10/2016, repaired mitral and tricuspid   valve gradients are stable. Mean aortic valve gradient has   increased from 27 mmHg to 36 mmHg.     Labs/Other Tests and Data Reviewed:    EKG:  An ECG dated 09/21/16 was personally reviewed today and demonstrated:  Sinus bradycardia 59 bpm poor R wave progression anteriorly, no acute change Recent Labs: No results found for requested labs within last 8760 hours.   Recent Lipid Panel No results found for: CHOL, TRIG, HDL, CHOLHDL, LDLCALC, LDLDIRECT  Wt Readings from Last 3 Encounters:  07/11/18 168 lb (76.2 kg)  11/03/16 165 lb (74.8 kg)  10/06/16 159 lb 1.9 oz (72.2 kg)     Objective:    Vital Signs:  Ht 5\' 6"  (1.676 m)   Wt 168 lb (76.2 kg)   BMI 27.12 kg/m    Well nourished, well developed female in no  acute distress. No JVD or Edema  ASSESSMENT & PLAN:    1. Status post mitral valve repair, tricuspid valve repair and Maze procedure 2014 2. Paroxysmal atrial fibrillation on Coumadin-having to have it checked more frequently 3. Moderate aortic stenosis-needs f/u echo in Sept & f/u with Dr. Angelena Form 4. History of mild CAD on cath in 2014-no angina 5. Essential hypertension  6. Hyperlipidemia on lovastatin 40 mg daily LDL 108 09/2017 but cholesterol was checked again and March I just cannot see her LDL- managed by PCP  COVID-19 Education: The signs and symptoms of COVID-19 were discussed with the patient and how to seek care for testing (follow up with PCP or arrange E-visit).   The importance of social distancing was discussed today.  Time:   Today, I have spent 15 minutes with the patient with telehealth technology discussing the above problems.     Medication Adjustments/Labs and Tests Ordered: Current medicines are reviewed at length with the patient  today.  Concerns regarding medicines are outlined above.   Tests Ordered: No orders of the defined types were placed in this encounter.   Medication Changes: No orders of the defined types were placed in this encounter.   Disposition:  Follow up in 5 month(s) with Dr. Angelena Form  Signed, Ermalinda Barrios, PA-C  07/11/2018 2:06 PM    [place]

## 2018-07-31 DIAGNOSIS — R0602 Shortness of breath: Secondary | ICD-10-CM | POA: Diagnosis not present

## 2018-07-31 DIAGNOSIS — E1121 Type 2 diabetes mellitus with diabetic nephropathy: Secondary | ICD-10-CM | POA: Diagnosis not present

## 2018-07-31 DIAGNOSIS — I4891 Unspecified atrial fibrillation: Secondary | ICD-10-CM | POA: Diagnosis not present

## 2018-07-31 DIAGNOSIS — Z7901 Long term (current) use of anticoagulants: Secondary | ICD-10-CM | POA: Diagnosis not present

## 2018-08-14 DIAGNOSIS — R0602 Shortness of breath: Secondary | ICD-10-CM | POA: Diagnosis not present

## 2018-08-14 DIAGNOSIS — R269 Unspecified abnormalities of gait and mobility: Secondary | ICD-10-CM | POA: Diagnosis not present

## 2018-08-14 DIAGNOSIS — R42 Dizziness and giddiness: Secondary | ICD-10-CM | POA: Diagnosis not present

## 2018-08-14 DIAGNOSIS — I4891 Unspecified atrial fibrillation: Secondary | ICD-10-CM | POA: Diagnosis not present

## 2018-08-14 DIAGNOSIS — R270 Ataxia, unspecified: Secondary | ICD-10-CM | POA: Diagnosis not present

## 2018-08-16 DIAGNOSIS — I4891 Unspecified atrial fibrillation: Secondary | ICD-10-CM | POA: Diagnosis not present

## 2018-08-16 DIAGNOSIS — R269 Unspecified abnormalities of gait and mobility: Secondary | ICD-10-CM | POA: Diagnosis not present

## 2018-08-16 DIAGNOSIS — R270 Ataxia, unspecified: Secondary | ICD-10-CM | POA: Diagnosis not present

## 2018-08-16 DIAGNOSIS — Z7901 Long term (current) use of anticoagulants: Secondary | ICD-10-CM | POA: Diagnosis not present

## 2018-08-28 DIAGNOSIS — R269 Unspecified abnormalities of gait and mobility: Secondary | ICD-10-CM | POA: Diagnosis not present

## 2018-08-28 DIAGNOSIS — Z7189 Other specified counseling: Secondary | ICD-10-CM | POA: Diagnosis not present

## 2018-08-28 DIAGNOSIS — E1043 Type 1 diabetes mellitus with diabetic autonomic (poly)neuropathy: Secondary | ICD-10-CM | POA: Diagnosis not present

## 2018-08-28 DIAGNOSIS — R42 Dizziness and giddiness: Secondary | ICD-10-CM | POA: Diagnosis not present

## 2018-09-04 DIAGNOSIS — E1121 Type 2 diabetes mellitus with diabetic nephropathy: Secondary | ICD-10-CM | POA: Diagnosis not present

## 2018-09-04 DIAGNOSIS — I4891 Unspecified atrial fibrillation: Secondary | ICD-10-CM | POA: Diagnosis not present

## 2018-09-04 DIAGNOSIS — Z7901 Long term (current) use of anticoagulants: Secondary | ICD-10-CM | POA: Diagnosis not present

## 2018-09-18 DIAGNOSIS — E1121 Type 2 diabetes mellitus with diabetic nephropathy: Secondary | ICD-10-CM | POA: Diagnosis not present

## 2018-09-18 DIAGNOSIS — I4891 Unspecified atrial fibrillation: Secondary | ICD-10-CM | POA: Diagnosis not present

## 2018-09-18 DIAGNOSIS — Z7901 Long term (current) use of anticoagulants: Secondary | ICD-10-CM | POA: Diagnosis not present

## 2018-10-09 DIAGNOSIS — G4700 Insomnia, unspecified: Secondary | ICD-10-CM | POA: Diagnosis not present

## 2018-10-09 DIAGNOSIS — Z7901 Long term (current) use of anticoagulants: Secondary | ICD-10-CM | POA: Diagnosis not present

## 2018-10-09 DIAGNOSIS — I4891 Unspecified atrial fibrillation: Secondary | ICD-10-CM | POA: Diagnosis not present

## 2018-10-09 DIAGNOSIS — E1121 Type 2 diabetes mellitus with diabetic nephropathy: Secondary | ICD-10-CM | POA: Diagnosis not present

## 2018-11-02 DIAGNOSIS — Z794 Long term (current) use of insulin: Secondary | ICD-10-CM | POA: Diagnosis not present

## 2018-11-02 DIAGNOSIS — E1121 Type 2 diabetes mellitus with diabetic nephropathy: Secondary | ICD-10-CM | POA: Diagnosis not present

## 2018-11-06 DIAGNOSIS — I4891 Unspecified atrial fibrillation: Secondary | ICD-10-CM | POA: Diagnosis not present

## 2018-11-06 DIAGNOSIS — E1121 Type 2 diabetes mellitus with diabetic nephropathy: Secondary | ICD-10-CM | POA: Diagnosis not present

## 2018-11-06 DIAGNOSIS — Z7901 Long term (current) use of anticoagulants: Secondary | ICD-10-CM | POA: Diagnosis not present

## 2018-11-06 DIAGNOSIS — G4700 Insomnia, unspecified: Secondary | ICD-10-CM | POA: Diagnosis not present

## 2018-11-07 ENCOUNTER — Emergency Department (HOSPITAL_COMMUNITY): Payer: Medicare Other

## 2018-11-07 ENCOUNTER — Emergency Department (HOSPITAL_COMMUNITY)
Admission: EM | Admit: 2018-11-07 | Discharge: 2018-11-07 | Disposition: A | Discharge status: Home / Self Care | Payer: Medicare Other | Attending: Emergency Medicine | Admitting: Emergency Medicine

## 2018-11-07 ENCOUNTER — Other Ambulatory Visit: Payer: Self-pay

## 2018-11-07 DIAGNOSIS — I4891 Unspecified atrial fibrillation: Secondary | ICD-10-CM | POA: Diagnosis not present

## 2018-11-07 DIAGNOSIS — Z8673 Personal history of transient ischemic attack (TIA), and cerebral infarction without residual deficits: Secondary | ICD-10-CM | POA: Insufficient documentation

## 2018-11-07 DIAGNOSIS — Y92002 Bathroom of unspecified non-institutional (private) residence single-family (private) house as the place of occurrence of the external cause: Secondary | ICD-10-CM | POA: Insufficient documentation

## 2018-11-07 DIAGNOSIS — Y999 Unspecified external cause status: Secondary | ICD-10-CM | POA: Insufficient documentation

## 2018-11-07 DIAGNOSIS — Z79899 Other long term (current) drug therapy: Secondary | ICD-10-CM | POA: Insufficient documentation

## 2018-11-07 DIAGNOSIS — W19XXXA Unspecified fall, initial encounter: Secondary | ICD-10-CM | POA: Diagnosis not present

## 2018-11-07 DIAGNOSIS — R55 Syncope and collapse: Secondary | ICD-10-CM | POA: Insufficient documentation

## 2018-11-07 DIAGNOSIS — I10 Essential (primary) hypertension: Secondary | ICD-10-CM | POA: Insufficient documentation

## 2018-11-07 DIAGNOSIS — S299XXA Unspecified injury of thorax, initial encounter: Secondary | ICD-10-CM | POA: Diagnosis not present

## 2018-11-07 DIAGNOSIS — M79675 Pain in left toe(s): Secondary | ICD-10-CM | POA: Diagnosis not present

## 2018-11-07 DIAGNOSIS — W182XXA Fall in (into) shower or empty bathtub, initial encounter: Secondary | ICD-10-CM | POA: Diagnosis not present

## 2018-11-07 DIAGNOSIS — S300XXA Contusion of lower back and pelvis, initial encounter: Secondary | ICD-10-CM | POA: Diagnosis not present

## 2018-11-07 DIAGNOSIS — R42 Dizziness and giddiness: Secondary | ICD-10-CM | POA: Diagnosis not present

## 2018-11-07 DIAGNOSIS — I959 Hypotension, unspecified: Secondary | ICD-10-CM | POA: Diagnosis not present

## 2018-11-07 DIAGNOSIS — M533 Sacrococcygeal disorders, not elsewhere classified: Secondary | ICD-10-CM | POA: Diagnosis not present

## 2018-11-07 DIAGNOSIS — R531 Weakness: Secondary | ICD-10-CM | POA: Diagnosis not present

## 2018-11-07 DIAGNOSIS — Y939 Activity, unspecified: Secondary | ICD-10-CM | POA: Insufficient documentation

## 2018-11-07 DIAGNOSIS — S99922A Unspecified injury of left foot, initial encounter: Secondary | ICD-10-CM | POA: Diagnosis not present

## 2018-11-07 DIAGNOSIS — S0990XA Unspecified injury of head, initial encounter: Secondary | ICD-10-CM | POA: Diagnosis not present

## 2018-11-07 DIAGNOSIS — S3992XA Unspecified injury of lower back, initial encounter: Secondary | ICD-10-CM | POA: Diagnosis not present

## 2018-11-07 DIAGNOSIS — M79672 Pain in left foot: Secondary | ICD-10-CM | POA: Diagnosis not present

## 2018-11-07 LAB — COMPREHENSIVE METABOLIC PANEL
ALT: 22 U/L (ref 0–44)
AST: 26 U/L (ref 15–41)
Albumin: 3.6 g/dL (ref 3.5–5.0)
Alkaline Phosphatase: 55 U/L (ref 38–126)
Anion gap: 8 (ref 5–15)
BUN: 32 mg/dL — ABNORMAL HIGH (ref 8–23)
CO2: 26 mmol/L (ref 22–32)
Calcium: 9.6 mg/dL (ref 8.9–10.3)
Chloride: 104 mmol/L (ref 98–111)
Creatinine, Ser: 1.12 mg/dL — ABNORMAL HIGH (ref 0.44–1.00)
GFR calc Af Amer: 54 mL/min — ABNORMAL LOW (ref >60)
GFR calc non Af Amer: 47 mL/min — ABNORMAL LOW (ref >60)
Glucose, Bld: 190 mg/dL — ABNORMAL HIGH (ref 70–99)
Potassium: 5 mmol/L (ref 3.5–5.1)
Sodium: 138 mmol/L (ref 135–145)
Total Bilirubin: 0.4 mg/dL (ref 0.3–1.2)
Total Protein: 6.2 g/dL — ABNORMAL LOW (ref 6.5–8.1)

## 2018-11-07 LAB — CBC WITH DIFFERENTIAL/PLATELET
Abs Immature Granulocytes: 0.09 10*3/uL — ABNORMAL HIGH (ref 0.00–0.07)
Basophils Absolute: 0 10*3/uL (ref 0.0–0.1)
Basophils Relative: 0 %
Eosinophils Absolute: 0 10*3/uL (ref 0.0–0.5)
Eosinophils Relative: 0 %
HCT: 39.6 % (ref 36.0–46.0)
Hemoglobin: 13 g/dL (ref 12.0–15.0)
Immature Granulocytes: 1 %
Lymphocytes Relative: 8 %
Lymphs Abs: 1 10*3/uL (ref 0.7–4.0)
MCH: 30.2 pg (ref 26.0–34.0)
MCHC: 32.8 g/dL (ref 30.0–36.0)
MCV: 92.1 fL (ref 80.0–100.0)
Monocytes Absolute: 0.9 10*3/uL (ref 0.1–1.0)
Monocytes Relative: 7 %
Neutro Abs: 11.1 10*3/uL — ABNORMAL HIGH (ref 1.7–7.7)
Neutrophils Relative %: 84 %
Platelets: 196 10*3/uL (ref 150–400)
RBC: 4.3 MIL/uL (ref 3.87–5.11)
RDW: 12.7 % (ref 11.5–15.5)
WBC: 13.2 10*3/uL — ABNORMAL HIGH (ref 4.0–10.5)
nRBC: 0 % (ref 0.0–0.2)

## 2018-11-07 LAB — PROTIME-INR
INR: 2.1 — ABNORMAL HIGH (ref 0.8–1.2)
Prothrombin Time: 23.4 seconds — ABNORMAL HIGH (ref 11.4–15.2)

## 2018-11-07 MED ORDER — DICLOFENAC SODIUM 1 % TD GEL: 2.0000 g | Freq: Four times a day (QID) | TRANSDERMAL | 1 refills | Status: DC

## 2018-11-07 MED ORDER — HYDROCORTISONE ACETATE 25 MG RE SUPP: 25.0000 mg | Freq: Two times a day (BID) | RECTAL | 0 refills | Status: DC

## 2018-11-07 MED ORDER — ACETAMINOPHEN 500 MG PO TABS
1000.0000 mg | ORAL_TABLET | Freq: Once | ORAL | Status: AC
  Administered 2018-11-07: 17:00:00 1000 mg via ORAL
  Filled 2018-11-07: qty 2

## 2018-11-07 NOTE — ED Triage Notes (Signed)
Pt brought in by ems, pt states that she fell in her bathroom approx 0930 today while she was waslking to the bathroom ; pt states she did feel dizzy prior to falling ; pt states when she fell she fell back Into the bathtub and hit the back of her head ; denies any headache or neck pain ; pt doesn't know if she lost consciousness ; pt denies any further dizziness ; pt only c/o tailbone pain ; pt on coumadin ; pt alert and oriented x 4 ; she was found approx 2 hours later by her daughters

## 2018-11-07 NOTE — ED Notes (Signed)
Patient transported to X-ray 

## 2018-11-07 NOTE — Discharge Instructions (Addendum)
Your CAT scan of your head and x-rays of your tailbone, chest and toe today showed no sign of injury.  Your lab work appeared to be at baseline with no new changes and Coumadin level was 2.1.  Concern you have a bad bruise to your tailbone and that possibly her blood pressure dropped or you had an irregular heart rhythm that caused your symptoms today.  If this happens again it is very important for you to follow-up with your doctor to get a new echo of your heart and possibly wear a heart monitor.  Make sure you are taking 2 extra strength Tylenol every 6 hours for the pain and you can use the gel where it hurts up to 4 times a day.

## 2018-11-07 NOTE — ED Notes (Signed)
Patient verbalizes understanding of discharge instructions. Opportunity for questioning and answering were provided.  patient discharged from ED.  

## 2018-11-07 NOTE — ED Provider Notes (Addendum)
[place] EMERGENCY DEPARTMENT Provider Note   CSN: [id number] Arrival date & time: 11/07/18  1245     History   Chief Complaint Chief Complaint  Patient presents with  . Fall    HPI Rachel Santos is a 78 y.o. female.     Patient is a 78 year old female with a history of atrial fibrillation, hypertension, mitral regurgitation status post valve repair, stroke who presents today after a fall at home.  Patient states she woke up this morning and felt like her normal self.  She had been up moving around and went into the bathroom when she suddenly felt dizzy which she does describe as feeling like she might pass out.  She then was falling and fell into her bathtub.  She was unable to get out of the bathtub but does note that as soon as she and landed in the bathtub and sat there for a very short time her dizziness resolved.  She was diaphoretic and clammy but denies any chest pain, palpitations, shortness of breath.  She states she has been eating and drinking normally and felt her normal self this week.  She has recently started taking BuSpar but all of her other medications are unchanged.  She has not taken any of her medications today.  She does take Coumadin and does recall hitting her head today during the fall.  Only complaint is of pain in her coccyx but denies extremity pain, abdominal pain or neck pain.  The history is provided by the patient.  Fall This is a new problem. The current episode started 3 to 5 hours ago. The problem occurs constantly. The problem has been resolved. Pertinent negatives include no chest pain, no abdominal pain and no shortness of breath. Associated symptoms comments: dizziness. Nothing aggravates the symptoms. The symptoms are relieved by lying down. She has tried nothing for the symptoms.    Past Medical History:  Diagnosis Date  . Arthritis    knees  . Atrial fibrillation ([place])   . Cataracts, bilateral   . Diabetes mellitus    . GERD (gastroesophageal reflux disease)   . Hypertension   . Mitral regurgitation   . PONV (postoperative nausea and vomiting)   . S/P Maze operation for atrial fibrillation 10/11/2012   Complete bilateral atrial lesion set using bipolar radiofrequency and cryothermy ablation with clipping of LA appendage  . 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   30mm Sorin Memo 3D ring annuloplasty with 26 mm Edwards mc3 tricuspid ring annuloplasty   . Shortness of breath    with activity  . Stroke [facility])    mini stroke with vision problems  . Tricuspid regurgitation     Patient Active Problem List   Diagnosis Date Noted  . Aortic stenosis 04/25/2018  . 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 HTN (hypertension) 08/13/2013  . Insomnia 08/13/2013  . Carotid stenosis 08/13/2013  . S/P MVR (mitral valve repair) 05/27/2013  . S/P TVR (tricuspid valve repair) 05/27/2013  . Long term (current) use of anticoagulants 10/25/2012  . S/P mitral valve repair, tricuspid valve repair + maze procedure 10/11/2012  . S/P Maze operation for atrial fibrillation 10/11/2012  . Tricuspid regurgitation   . Mitral regurgitation 07/03/2012  . Atrial fibrillation ([place]) 07/03/2012    Past Surgical History:  Procedure Laterality Date  . CARDIAC CATHETERIZATION  09-05-12  . CARDIOVERSION N/A 07/31/2012  Procedure: CARDIOVERSION;  Surgeon: Josue Hector, MD;  Location: [place];  Service: Cardiovascular;  Laterality: N/A;  . CESAREAN SECTION     x 2  . COLONOSCOPY WITH PROPOFOL N/A 11/28/2014   Procedure: COLONOSCOPY WITH PROPOFOL;  Surgeon: Carol Ada, MD;  Location: [facility];  Service: Endoscopy;  Laterality: N/A;  . FLEXIBLE SIGMOIDOSCOPY N/A 12/18/2015   Procedure: FLEXIBLE SIGMOIDOSCOPY;  Surgeon: Carol Ada, MD;  Location: [facility];  Service: Endoscopy;  Laterality: N/A;  . INTRAOPERATIVE TRANSESOPHAGEAL ECHOCARDIOGRAM N/A  10/11/2012   Procedure: INTRAOPERATIVE TRANSESOPHAGEAL ECHOCARDIOGRAM;  Surgeon: Rexene Alberts, MD;  Location: [place];  Service: Open Heart Surgery;  Laterality: N/A;  . MASTECTOMY Left 1982  . MAZE N/A 10/11/2012   Procedure: MAZE;  Surgeon: Rexene Alberts, MD;  Location: [place];  Service: Open Heart Surgery;  Laterality: N/A;  . MITRAL VALVE REPAIR N/A 10/11/2012   Procedure: MITRAL VALVE REPAIR (MVR);  Surgeon: Rexene Alberts, MD;  Location: [place];  Service: Open Heart Surgery;  Laterality: N/A;  . TEE WITHOUT CARDIOVERSION N/A 07/31/2012   Procedure: TRANSESOPHAGEAL ECHOCARDIOGRAM (TEE);  Surgeon: Josue Hector, MD;  Location: [facility] ENDOSCOPY;  Service: Cardiovascular;  Laterality: N/A;  . TRICUSPID VALVE REPLACEMENT N/A 10/11/2012   Procedure: TRICUSPID VALVE REPAIR;  Surgeon: Rexene Alberts, MD;  Location: [place];  Service: Open Heart Surgery;  Laterality: N/A;  . TUBAL LIGATION       OB History   No obstetric history on file.      Home Medications    Prior to Admission medications   Medication Sig Start Date End Date Taking? Authorizing Provider  acetaminophen (TYLENOL) 325 MG tablet Take 650 mg by mouth every 6 (six) hours as needed for moderate pain or headache.    [provider]  amLODipine (NORVASC) 5 MG tablet Take 1 tablet (5 mg total) by mouth daily. 09/21/16 07/11/18  Isaiah Serge, NP  calcium carbonate (OS-CAL) 600 MG TABS tablet Take 1,400 mg by mouth daily.     [provider]  glipiZIDE (GLUCOTROL XL) 10 MG 24 hr tablet Take 10 mg by mouth daily with breakfast.    [provider]  Insulin Detemir (LEVEMIR FLEXPEN) 100 UNIT/ML Pen Inject 22-40 Units into the skin 2 (two) times daily. 40 units in the morning and 22 units at night    [provider]  insulin lispro (HUMALOG KWIKPEN) 100 UNIT/ML SOPN Inject 10-15 Units into the skin daily before supper. Sliding scale    [provider]  losartan (COZAAR) 100 MG tablet Take 1 tablet  (100 mg total) by mouth daily. 10/22/12   Nani Skillern, PA-C  lovastatin (MEVACOR) 40 MG tablet TAKE 1 TABLET BY MOUTH DAILY AT BEDTIME    [name], MD  Multiple Vitamin (MULTIVITAMIN) tablet Take 1 tablet by mouth every morning.     [provider]  omeprazole (PRILOSEC) 40 MG capsule Take 40 mg by mouth every morning.  06/28/12   [provider]  ONE TOUCH ULTRA TEST test strip 1 each by Other route 3 (three) times daily.  04/07/14   [provider]  venlafaxine XR (EFFEXOR-XR) 150 MG 24 hr capsule Take 150 mg by mouth every morning.     [provider]  venlafaxine XR (EFFEXOR-XR) 75 MG 24 hr capsule Take 75 mg by mouth every morning.    [provider]  warfarin (COUMADIN) 3 MG tablet Take 3 mg by mouth as directed. Take  by mouth as directed by the coumadin clinic    [provider]    Family History Family History  Problem Relation Age of Onset  . Cancer Father        prostate  . Hypertension Father   . Heart attack Paternal Grandfather   . Stroke Neg Hx     Social History Social History   Tobacco Use  . Smoking status: Never Smoker  . Smokeless tobacco: Never Used  Substance Use Topics  . Alcohol use: No  . Drug use: No     Allergies   Ultram [tramadol]   Review of Systems Review of Systems  Respiratory: Negative for shortness of breath.   Cardiovascular: Negative for chest pain.  Gastrointestinal: Negative for abdominal pain.  All other systems reviewed and are negative.    Physical Exam Updated Vital Signs BP (!) 147/53 (BP Location: Right Arm) Comment: Simultaneous filing. User may not have seen previous data.  Pulse 62 Comment: Simultaneous filing. User may not have seen previous data.  Temp 98.4 F (36.9 C) (Oral) Comment: Simultaneous filing. User may not have seen previous data. Comment (Src): Simultaneous filing. User may not have seen previous data.  Resp 16 Comment:  Simultaneous filing. User may not have seen previous data.  SpO2 100% Comment: Simultaneous filing. User may not have seen previous data.  Physical Exam Vitals signs and nursing note reviewed.  Constitutional:      General: She is not in acute distress.    Appearance: She is well-developed and normal weight.  HENT:     Head: Normocephalic and atraumatic.     Nose: Nose normal.     Mouth/Throat:     Mouth: Mucous membranes are moist.  Eyes:     Pupils: Pupils are equal, round, and reactive to light.  Cardiovascular:     Rate and Rhythm: Normal rate and regular rhythm.     Pulses: Normal pulses.     Heart sounds: Murmur present. Systolic murmur present with a grade of 3/6. No friction rub.  Pulmonary:     Effort: Pulmonary effort is normal.     Breath sounds: Normal breath sounds. No wheezing or rales.     Comments: Well-healed midline sternotomy scar Abdominal:     General: Bowel sounds are normal. There is no distension.     Palpations: Abdomen is soft.     Tenderness: There is no abdominal tenderness. There is no guarding or rebound.  Musculoskeletal: Normal range of motion.        General: No tenderness.       Back:     Right lower leg: No edema.     Left lower leg: No edema.     Comments: No edema  Skin:    General: Skin is warm and dry.     Findings: No rash.  Neurological:     General: No focal deficit present.     Mental Status: She is alert and oriented to person, place, and time. Mental status is at baseline.     Cranial Nerves: No cranial nerve deficit.  Psychiatric:        Mood and Affect: Mood normal.        Behavior: Behavior normal.        Thought Content: Thought content normal.      ED Treatments / Results  Labs (all labs ordered are listed, but only abnormal results are displayed) Labs Reviewed  CBC WITH DIFFERENTIAL/PLATELET - Abnormal; Notable for the following  components:      Result Value   WBC 13.2 (*)    Neutro Abs 11.1 (*)    Abs  Immature Granulocytes 0.09 (*)    All other components within normal limits  COMPREHENSIVE METABOLIC PANEL - Abnormal; Notable for the following components:   Glucose, Bld 190 (*)    BUN 32 (*)    Creatinine, Ser 1.12 (*)    Total Protein 6.2 (*)    GFR calc non Af Amer 47 (*)    GFR calc Af Amer 54 (*)    All other components within normal limits  PROTIME-INR - Abnormal; Notable for the following components:   Prothrombin Time 23.4 (*)    INR 2.1 (*)    All other components within normal limits    EKG EKG Interpretation  Date/Time:  Wednesday November 07 2018 12:46:41 EDT Ventricular Rate:  61 PR Interval:    QRS Duration: 102 QT Interval:  435 QTC Calculation: 439 R Axis:   -7 Text Interpretation:  Sinus rhythm Anteroseptal infarct, old No significant change since last tracing Confirmed by Blanchie Dessert [phone]) on 11/07/2018 12:58:00 PM   Radiology Dg Chest 2 View  Result Date: 11/07/2018 CLINICAL DATA:  Status post fall today. EXAM: CHEST - 2 VIEW COMPARISON:  October 17, 2012 FINDINGS: The mediastinal contour and cardiac silhouette are stable. The heart size is enlarged. There is no focal infiltrate, pulmonary edema, or pleural effusion. The soft tissues and osseous structures are normal. IMPRESSION: No active cardiopulmonary disease. Electronically Signed   By: Abelardo Diesel M.D.   On: 11/07/2018 13:43   Dg Sacrum/coccyx  Result Date: 11/07/2018 CLINICAL DATA:  [place].  Sacral pain. EXAM: SACRUM AND COCCYX - 2+ VIEW COMPARISON:  None. FINDINGS: Both hips are normally located. The pubic symphysis and SI joints are intact. No definite sacral fractures. IMPRESSION: No acute bony findings. Electronically Signed   By: Marijo Sanes M.D.   On: 11/07/2018 15:14   Ct Head Wo Contrast  Result Date: 11/07/2018 CLINICAL DATA:  [place].  Hit head. EXAM: CT HEAD WITHOUT CONTRAST TECHNIQUE: Contiguous axial images were obtained from the base of the skull through the vertex without intravenous  contrast. COMPARISON:  08/09/2011 FINDINGS: Brain: Stable age related cerebral atrophy, ventriculomegaly and periventricular white matter disease. No extra-axial fluid collections are identified. No CT findings for acute hemispheric infarction or intracranial hemorrhage. No mass lesions. The brainstem and cerebellum are normal. Vascular: No aneurysm or hyperdense vessels. Moderate vascular calcifications. Skull: No acute skull fracture or worrisome bone lesions. Sinuses/Orbits: The paranasal sinuses and mastoid air cells are clear. The globes are intact. Other: No scalp lesions or hematoma. IMPRESSION: No acute intracranial findings or skull fracture. Electronically Signed   By: Marijo Sanes M.D.   On: 11/07/2018 15:16   Dg Toe Great Left  Result Date: 11/07/2018 CLINICAL DATA:  Fall, pain EXAM: LEFT GREAT TOE COMPARISON:  None. FINDINGS: No acute fracture or malalignment. Mild joint space narrowing at the IP joint. Irregular appearance of the nail of the great toe. Vascular calcifications are present. No focal soft tissue swelling. IMPRESSION: No acute osseous abnormality, left great toe. Electronically Signed   By: Davina Poke M.D.   On: 11/07/2018 15:12    Procedures Procedures (including critical care time)  Medications Ordered in ED Medications - No data to display   Initial Impression / Assessment and Plan / ED Course  I have reviewed the triage vital signs and the nursing notes.  Pertinent labs &  imaging results that were available during my care of the patient were reviewed by me and considered in my medical decision making (see chart for details).       Elderly female with known heart history presenting today with an episode of dizziness resulting in her falling into the bathtub and unable to get up.  Patient laid in the bathtub for about 2 hours until her family came.  She reports the dizziness is feeling lightheaded that improved once she fell to the ground.  She states that  often times she will feel woozy with standing up and have to wait a minute but today she had been up and moving around and it came out of nowhere.  She denies any chest pain, abdominal pain or shortness of breath.  Currently her only complaint is of pain in her coccyx.  She is moving all extremities without difficulty and low suspicion for stroke.  Patient does take warfarin and did hit her head today.  INR pending and CT of the head to rule out acute injury.  EKG today shows sinus rhythm without significant changes from old EKGs.  Labs are pending.  3:25 PM Imaging is negative for an acute process.  Head CT without bleed.  Chest x-ray within normal limits.  Patient's CBC shows mild leukocytosis of 13,000 of unknown etiology but CMP without acute findings.  INR is therapeutic at 2.1.  Patient is able to stand and ambulate here and denies any significant pain in the legs.  She also denies any dizziness with standing and walking now.  Unclear what caused the dizziness whether it was a dysrhythmia versus drop in blood pressure.  Patient had an echo last in August 2019 that showed 60 to 65% EF and stable mitral and tricuspid valve gradients.  Did discuss with patient that if she has any further symptoms she most likely needs a another echo but she is already called her doctor and has scheduled follow-up.  Instructed patient to take Tylenol as needed for aches and pains and to continue her medication as prescribed.  Pt was able to ambulate to the bathroom and no just states she needs to have a BM and would like to go home.  Family can stay with pt to ensure she is safe.  Final Clinical Impressions(s) / ED Diagnoses   Final diagnoses:  Near syncope  Fall, initial encounter  Contusion of coccyx, initial encounter    ED Discharge Orders         Ordered    diclofenac sodium (VOLTAREN) 1 % GEL  4 times daily     11/07/18 1549    hydrocortisone (ANUSOL-HC) 25 MG suppository  2 times daily     11/07/18 1549            Blanchie Dessert, MD 11/07/18 1559    Blanchie Dessert, MD 11/07/18 2102

## 2018-11-07 NOTE — ED Notes (Signed)
Patient transported to CT 

## 2018-11-09 DIAGNOSIS — W19XXXA Unspecified fall, initial encounter: Secondary | ICD-10-CM | POA: Diagnosis not present

## 2018-11-09 DIAGNOSIS — Z7189 Other specified counseling: Secondary | ICD-10-CM | POA: Diagnosis not present

## 2018-11-09 DIAGNOSIS — K649 Unspecified hemorrhoids: Secondary | ICD-10-CM | POA: Diagnosis not present

## 2018-11-12 ENCOUNTER — Telehealth: Payer: Self-pay | Admitting: Internal Medicine

## 2018-11-23 ENCOUNTER — Other Ambulatory Visit: Payer: Self-pay

## 2018-11-23 ENCOUNTER — Ambulatory Visit (HOSPITAL_COMMUNITY): Payer: Medicare Other | Attending: Cardiology

## 2018-11-23 DIAGNOSIS — Z9889 Other specified postprocedural states: Secondary | ICD-10-CM

## 2018-11-23 DIAGNOSIS — E782 Mixed hyperlipidemia: Secondary | ICD-10-CM | POA: Diagnosis not present

## 2018-11-23 DIAGNOSIS — I350 Nonrheumatic aortic (valve) stenosis: Secondary | ICD-10-CM

## 2018-11-23 DIAGNOSIS — E1121 Type 2 diabetes mellitus with diabetic nephropathy: Secondary | ICD-10-CM | POA: Diagnosis not present

## 2018-11-23 DIAGNOSIS — E1065 Type 1 diabetes mellitus with hyperglycemia: Secondary | ICD-10-CM | POA: Diagnosis not present

## 2018-11-23 DIAGNOSIS — I4891 Unspecified atrial fibrillation: Secondary | ICD-10-CM | POA: Diagnosis not present

## 2018-11-23 MED ORDER — PERFLUTREN LIPID MICROSPHERE
1.0000 mL | INTRAVENOUS | Status: AC | PRN
  Administered 2018-11-23: 1 mL via INTRAVENOUS

## 2018-11-29 ENCOUNTER — Encounter: Payer: Self-pay | Admitting: Cardiovascular Disease

## 2018-11-29 ENCOUNTER — Encounter: Payer: Self-pay | Admitting: *Deleted

## 2018-11-29 ENCOUNTER — Ambulatory Visit (INDEPENDENT_AMBULATORY_CARE_PROVIDER_SITE_OTHER): Payer: Medicare Other | Admitting: Cardiovascular Disease

## 2018-11-29 ENCOUNTER — Other Ambulatory Visit: Payer: Self-pay

## 2018-11-29 VITALS — BP 110/60 | HR 70 | Ht 66.0 in | Wt 157.0 lb

## 2018-11-29 DIAGNOSIS — Z9889 Other specified postprocedural states: Secondary | ICD-10-CM

## 2018-11-29 DIAGNOSIS — E1065 Type 1 diabetes mellitus with hyperglycemia: Secondary | ICD-10-CM | POA: Diagnosis not present

## 2018-11-29 DIAGNOSIS — R42 Dizziness and giddiness: Secondary | ICD-10-CM | POA: Diagnosis not present

## 2018-11-29 DIAGNOSIS — I350 Nonrheumatic aortic (valve) stenosis: Secondary | ICD-10-CM | POA: Diagnosis not present

## 2018-11-29 DIAGNOSIS — E1043 Type 1 diabetes mellitus with diabetic autonomic (poly)neuropathy: Secondary | ICD-10-CM | POA: Diagnosis not present

## 2018-11-29 DIAGNOSIS — Z23 Encounter for immunization: Secondary | ICD-10-CM | POA: Diagnosis not present

## 2018-11-29 DIAGNOSIS — I4891 Unspecified atrial fibrillation: Secondary | ICD-10-CM | POA: Diagnosis not present

## 2018-11-29 DIAGNOSIS — I2510 Atherosclerotic heart disease of native coronary artery without angina pectoris: Secondary | ICD-10-CM | POA: Diagnosis not present

## 2018-11-29 DIAGNOSIS — Z7189 Other specified counseling: Secondary | ICD-10-CM | POA: Diagnosis not present

## 2018-11-29 DIAGNOSIS — I480 Paroxysmal atrial fibrillation: Secondary | ICD-10-CM | POA: Diagnosis not present

## 2018-11-29 DIAGNOSIS — E782 Mixed hyperlipidemia: Secondary | ICD-10-CM | POA: Diagnosis not present

## 2018-11-29 NOTE — H&P (View-Only) (Signed)
Chief Complaint  Patient presents with  . 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 I saw her as a new patient for evaluation of atrial fibrillation and severe MR on 07/03/12. She was diagnosed with atrial fibrillation April of 2014 as well as severe MR and TR. LV function has always been normal. She had a TEE guided cardioversion in May 2016 with restoration of sinus rhythm. Her MR was noted to be central and severe. The LV cavity was not dilated. LV systolic function was normal. There was moderate to severe TR as well. She was felt to be symptomatic from her mitral valve disease. Cardiac cath on 09/05/12 with mild CAD. She underwent placement of mitral valve ring and tricuspid valve ring as well as MAZE procedure per Dr. Roxy Manns on 10/11/12. Post-operative course complicated by recurrence of atrial fibrillation with bradycardia. She was seen by Dr. Caryl Comes as an EP consult and pacemaker not recommended. She has been on coumadin. Carotid artery dopplers June 2015 with mild bilateral disease. She was seen in the ED at [facility] 11/07/18 after a syncopal episode. Echo 11/23/18 with LVEF over 65%. Moderate LVH. Stable mitral and tricuspid repair. There appeared to be moderate AS.    She is here today for follow up. The patient denies any chest pain, palpitations, lower extremity edema, orthopnea, PND, dizziness, near syncope or syncope. She feels weak. She has ongoing dizziness for the past several years.   Primary Care Physician: Merrilee Seashore, MD   Past Medical History:  Diagnosis Date  . Arthritis    knees  . Atrial fibrillation ([place])   . Cataracts, bilateral   . Diabetes mellitus   . GERD (gastroesophageal reflux disease)   . Hypertension   . Mitral  regurgitation   . PONV (postoperative nausea and vomiting)   . S/P Maze operation for atrial fibrillation 10/11/2012   Complete bilateral atrial lesion set using bipolar radiofrequency and cryothermy ablation with clipping of LA appendage  . 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   23mm Sorin Memo 3D ring annuloplasty  . 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   4mm Sorin Memo 3D ring annuloplasty with 26 mm Edwards mc3 tricuspid ring annuloplasty   . Shortness of breath    with activity  . Stroke [facility])    mini stroke with vision problems  . Tricuspid regurgitation     Past Surgical History:  Procedure Laterality Date  . CARDIAC CATHETERIZATION  09-05-12  . CARDIOVERSION N/A 07/31/2012   Procedure: CARDIOVERSION;  Surgeon: Josue Hector, MD;  Location: [place];  Service: Cardiovascular;  Laterality: N/A;  . CESAREAN SECTION     x 2  . COLONOSCOPY WITH PROPOFOL N/A 11/28/2014   Procedure: COLONOSCOPY WITH PROPOFOL;  Surgeon: Carol Ada, MD;  Location: [facility];  Service: Endoscopy;  Laterality: N/A;  . FLEXIBLE SIGMOIDOSCOPY N/A 12/18/2015   Procedure: FLEXIBLE SIGMOIDOSCOPY;  Surgeon: Carol Ada, MD;  Location: [facility] ENDOSCOPY;  Service: Endoscopy;  Laterality: N/A;  . INTRAOPERATIVE TRANSESOPHAGEAL ECHOCARDIOGRAM N/A 10/11/2012   Procedure: INTRAOPERATIVE TRANSESOPHAGEAL ECHOCARDIOGRAM;  Surgeon: Rexene Alberts, MD;  Location: [place];  Service: Open Heart Surgery;  Laterality: N/A;  . MASTECTOMY Left 1982  . MAZE N/A 10/11/2012   Procedure: MAZE;  Surgeon: Rexene Alberts,  MD;  Location: [facility] OR;  Service: Open Heart Surgery;  Laterality: N/A;  . MITRAL VALVE REPAIR N/A 10/11/2012   Procedure: MITRAL VALVE REPAIR (MVR);  Surgeon: Rexene Alberts, MD;  Location: [place];  Service: Open Heart Surgery;  Laterality: N/A;  . TEE WITHOUT CARDIOVERSION N/A 07/31/2012   Procedure: TRANSESOPHAGEAL ECHOCARDIOGRAM (TEE);  Surgeon: Josue Hector, MD;  Location: [facility] ENDOSCOPY;  Service: Cardiovascular;   Laterality: N/A;  . TRICUSPID VALVE REPLACEMENT N/A 10/11/2012   Procedure: TRICUSPID VALVE REPAIR;  Surgeon: Rexene Alberts, MD;  Location: [place];  Service: Open Heart Surgery;  Laterality: N/A;  . TUBAL LIGATION      Current Outpatient Medications  Medication Sig Dispense Refill  . acetaminophen (TYLENOL) 325 MG tablet Take 650 mg by mouth every 6 (six) hours as needed for moderate pain or headache.    . busPIRone (BUSPAR) 10 MG tablet Take 5 mg by mouth 2 (two) times daily.     . calcium carbonate (OS-CAL) 600 MG TABS tablet Take 1,400 mg by mouth daily.     . diclofenac sodium (VOLTAREN) 1 % GEL Apply 2 g topically 4 (four) times daily. 100 g 1  . glipiZIDE (GLUCOTROL XL) 10 MG 24 hr tablet Take 10 mg by mouth daily with breakfast.    . hydrocortisone (ANUSOL-HC) 25 MG suppository Place 1 suppository (25 mg total) rectally 2 (two) times daily. 12 suppository 0  . Insulin Detemir (LEVEMIR FLEXPEN) 100 UNIT/ML Pen Inject 22-40 Units into the skin 2 (two) times daily. 40 units in the morning and 22 units at night    . lisinopril (ZESTRIL) 40 MG tablet Take 40 mg by mouth daily.    Marland Kitchen losartan (COZAAR) 100 MG tablet Take 100 mg by mouth daily.    Marland Kitchen lovastatin (MEVACOR) 40 MG tablet TAKE 1 TABLET BY MOUTH DAILY AT BEDTIME 30 tablet 5  . Multiple Vitamin (MULTIVITAMIN) tablet Take 1 tablet by mouth every morning.     Marland Kitchen omeprazole (PRILOSEC) 40 MG capsule Take 40 mg by mouth every morning.     . ONE TOUCH ULTRA TEST test strip 1 each by Other route 3 (three) times daily.   2  . venlafaxine XR (EFFEXOR-XR) 150 MG 24 hr capsule Take 150 mg by mouth every morning.     . venlafaxine XR (EFFEXOR-XR) 75 MG 24 hr capsule Take 75 mg by mouth every morning.    . warfarin (COUMADIN) 3 MG tablet Take 6 mg by mouth every evening. Take by mouth as directed by the coumadin clinic     . amLODipine (NORVASC) 5 MG tablet Take 1 tablet (5 mg total) by mouth daily. 180 tablet 3   No current facility-administered  medications for this visit.     Allergies  Allergen Reactions  . Ultram [Tramadol] Nausea And Vomiting    Social History   Socioeconomic History  . Marital status: Widowed    Spouse name: Not on file  . Number of children: 3  . Years of education: Not on file  . Highest education level: Not on file  Occupational History  . Occupation: Air cabin crew  Social Needs  . Financial resource strain: Not on file  . Food insecurity    Worry: Not on file    Inability: Not on file  . Transportation needs    Medical: Not on file    Non-medical: Not on file  Tobacco Use  . Smoking status: Never Smoker  . Smokeless tobacco: Never  Used  Substance and Sexual Activity  . Alcohol use: No  . Drug use: No  . Sexual activity: Never  Lifestyle  . Physical activity    Days per week: Not on file    Minutes per session: Not on file  . Stress: Not on file  Relationships  . Social Herbalist on phone: Not on file    Gets together: Not on file    Attends religious service: Not on file    Active member of club or organization: Not on file    Attends meetings of clubs or organizations: Not on file    Relationship status: Not on file  . Intimate partner violence    Fear of current or ex partner: Not on file    Emotionally abused: Not on file    Physically abused: Not on file    Forced sexual activity: Not on file  Other Topics Concern  . Not on file  Social History Narrative  . Not on file    Family History  Problem Relation Age of Onset  . Cancer Father        prostate  . Hypertension Father   . Heart attack Paternal Grandfather   . Stroke Neg Hx     Review of Systems:  As stated in the HPI and otherwise negative.   BP 110/60   Pulse 70   Ht 5\' 6"  (1.676 m)   Wt 157 lb (71.2 kg)   SpO2 99%   BMI 25.34 kg/m   Physical Examination:  General: Well developed, well nourished, NAD  HEENT: OP clear, mucus membranes moist  SKIN: warm, dry. No  rashes. Neuro: No focal deficits  Musculoskeletal: Muscle strength 5/5 all ext  Psychiatric: Mood and affect normal  Neck: No JVD, no carotid bruits, no thyromegaly, no lymphadenopathy.  Lungs:Clear bilaterally, no wheezes, rhonci, crackles Cardiovascular: Regular rate and rhythm. Systolic murmur.  Abdomen:Soft. Bowel sounds present. Non-tender.  Extremities: No lower extremity edema. Pulses are 2 + in the bilateral DP/PT.  Echo August 2020: 1. The left ventricle has hyperdynamic systolic function, with an ejection fraction of >65%. The cavity size was normal. There is moderate asymmetric left ventricular hypertrophy. Left ventricular diastolic Doppler parameters are consistent with  pseudonormalization. Elevated left atrial and left ventricular end-diastolic pressures.  2. The right ventricle has normal systolic function. The cavity was normal. There is no increase in right ventricular wall thickness. Right ventricular systolic pressure could not be assessed.  3. Left atrial size was mildly dilated.  4. S/P MV repair. The mean MV gradient is normal at 35mmHg. There is no MR. MVA by PHT is 2.13cm2.  5. The tricuspid valve is abnormal.  6. S/P TV repair with normal mean MVG of 51mmHg and no TR.  7. The aortic valve is tricuspid. Moderate thickening of the aortic valve. Moderate calcification of the aortic valve with decreased cusp movement. Doppler of the AV and LVOT cannot be acurately assessed. There is significant mid cavitary and LVOT  turbulence with high gradient. Cannot give accurate AV gradients, peak velocity or AVA based on this study due to sampling issues in the presence of severe BSH, hyperdynamic LVF and AS all contributing to the gradient. There is evidence of a dagger  shaped velocity within the aortic doppler sample making differentiation impossible. Visually there appears to be moderate AS. Consider TEE if clinically indicated to get a better assessment of AS severity.  8. The  aorta is normal unless  otherwise noted.  EKG:  EKG is not ordered today. The ekg ordered today demonstrates   Recent Labs: 11/07/2018: ALT 22; BUN 32; Creatinine, Ser 1.12; Hemoglobin 13.0; Platelets 196; Potassium 5.0; Sodium 138   Lipid Panel No results found for: CHOL, TRIG, HDL, CHOLHDL, VLDL, LDLCALC, LDLDIRECT   Wt Readings from Last 3 Encounters:  11/29/18 157 lb (71.2 kg)  07/11/18 168 lb (76.2 kg)  11/03/16 165 lb (74.8 kg)     Other studies Reviewed: Additional studies/ records that were reviewed today include: . Review of the above records demonstrates:    Assessment and Plan:   1. Atrial fibrillation, paroxysmal: She appears to be in sinus today. Continue coumadin. (of note, Xarelto was changed to coumadin at the time of her valve surgery).   2. Mitral regurgitation: She is s/p MV ring in 2014, stable by echo 2020  3. Tricuspid regurgitation: She is s/p TV ring in 2014. Stable by echo in 2020  4. CAD without angina: Mild CAD by cath in 2014. No chest pain. Continue statin. She has not been on an ASA since she is on coumadin.    5.  Aortic stenosis: Moderate by echo last week but difficult to obtain gradients based on the poor quality of the study. Her mean gradient last year was 36 mmHg. With recent syncopal event and ongoing dizziness, will need to better define her AS. Will arrange a TEE at [facility] 12/12/18. Procedure risks reviewed with pt.   6. HTN: Her BP is controlled on Norvasc and Lisinopril. BP followed in primary care. She reports that she was told by DR. Ashby Dawes today that her creatinine had worsened. She is still on Lisinopril. We will try to get records of her BMET from last week.   7. Dizziness: unclear etiology. Will assess degree of AS. I have encouraged her to push her po intake.    Current medicines are reviewed at length with the patient today.  The patient does not have concerns regarding medicines.  The following changes have been made:  no  change  Labs/ tests ordered today include:   Orders Placed This Encounter  Procedures  . CBC  . Basic metabolic panel  . Protime-INR    Disposition:   FU with me in 2-3  months  Signed, Lauree Chandler, MD 11/29/2018 4:10 PM    [facility] [address], [place], [place]  [address] Phone: [phone]; Fax: [phone]

## 2018-11-29 NOTE — Progress Notes (Signed)
Chief Complaint  Patient presents with  . 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 I saw her as a new patient for evaluation of atrial fibrillation and severe MR on 07/03/12. She was diagnosed with atrial fibrillation April of 2014 as well as severe MR and TR. LV function has always been normal. She had a TEE guided cardioversion in May 2016 with restoration of sinus rhythm. Her MR was noted to be central and severe. The LV cavity was not dilated. LV systolic function was normal. There was moderate to severe TR as well. She was felt to be symptomatic from her mitral valve disease. Cardiac cath on 09/05/12 with mild CAD. She underwent placement of mitral valve ring and tricuspid valve ring as well as MAZE procedure per Dr. Roxy Manns on 10/11/12. Post-operative course complicated by recurrence of atrial fibrillation with bradycardia. She was seen by Dr. Caryl Comes as an EP consult and pacemaker not recommended. She has been on coumadin. Carotid artery dopplers June 2015 with mild bilateral disease. She was seen in the ED at [facility] 11/07/18 after a syncopal episode. Echo 11/23/18 with LVEF over 65%. Moderate LVH. Stable mitral and tricuspid repair. There appeared to be moderate AS.    She is here today for follow up. The patient denies any chest pain, palpitations, lower extremity edema, orthopnea, PND, dizziness, near syncope or syncope. She feels weak. She has ongoing dizziness for the past several years.   Primary Care Physician: Merrilee Seashore, MD   Past Medical History:  Diagnosis Date  . Arthritis    knees  . Atrial fibrillation ([place])   . Cataracts, bilateral   . Diabetes mellitus   . GERD (gastroesophageal reflux disease)   . Hypertension   . Mitral  regurgitation   . PONV (postoperative nausea and vomiting)   . S/P Maze operation for atrial fibrillation 10/11/2012   Complete bilateral atrial lesion set using bipolar radiofrequency and cryothermy ablation with clipping of LA appendage  . 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   45mm Sorin Memo 3D ring annuloplasty  . 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   44mm Sorin Memo 3D ring annuloplasty with 26 mm Edwards mc3 tricuspid ring annuloplasty   . Shortness of breath    with activity  . Stroke [facility])    mini stroke with vision problems  . Tricuspid regurgitation     Past Surgical History:  Procedure Laterality Date  . CARDIAC CATHETERIZATION  09-05-12  . CARDIOVERSION N/A 07/31/2012   Procedure: CARDIOVERSION;  Surgeon: Josue Hector, MD;  Location: [place];  Service: Cardiovascular;  Laterality: N/A;  . CESAREAN SECTION     x 2  . COLONOSCOPY WITH PROPOFOL N/A 11/28/2014   Procedure: COLONOSCOPY WITH PROPOFOL;  Surgeon: Carol Ada, MD;  Location: [facility];  Service: Endoscopy;  Laterality: N/A;  . FLEXIBLE SIGMOIDOSCOPY N/A 12/18/2015   Procedure: FLEXIBLE SIGMOIDOSCOPY;  Surgeon: Carol Ada, MD;  Location: [facility] ENDOSCOPY;  Service: Endoscopy;  Laterality: N/A;  . INTRAOPERATIVE TRANSESOPHAGEAL ECHOCARDIOGRAM N/A 10/11/2012   Procedure: INTRAOPERATIVE TRANSESOPHAGEAL ECHOCARDIOGRAM;  Surgeon: Rexene Alberts, MD;  Location: [place];  Service: Open Heart Surgery;  Laterality: N/A;  . MASTECTOMY Left 1982  . MAZE N/A 10/11/2012   Procedure: MAZE;  Surgeon: Rexene Alberts,  MD;  Location: [facility] OR;  Service: Open Heart Surgery;  Laterality: N/A;  . MITRAL VALVE REPAIR N/A 10/11/2012   Procedure: MITRAL VALVE REPAIR (MVR);  Surgeon: Rexene Alberts, MD;  Location: [place];  Service: Open Heart Surgery;  Laterality: N/A;  . TEE WITHOUT CARDIOVERSION N/A 07/31/2012   Procedure: TRANSESOPHAGEAL ECHOCARDIOGRAM (TEE);  Surgeon: Josue Hector, MD;  Location: [facility] ENDOSCOPY;  Service: Cardiovascular;   Laterality: N/A;  . TRICUSPID VALVE REPLACEMENT N/A 10/11/2012   Procedure: TRICUSPID VALVE REPAIR;  Surgeon: Rexene Alberts, MD;  Location: [place];  Service: Open Heart Surgery;  Laterality: N/A;  . TUBAL LIGATION      Current Outpatient Medications  Medication Sig Dispense Refill  . acetaminophen (TYLENOL) 325 MG tablet Take 650 mg by mouth every 6 (six) hours as needed for moderate pain or headache.    . busPIRone (BUSPAR) 10 MG tablet Take 5 mg by mouth 2 (two) times daily.     . calcium carbonate (OS-CAL) 600 MG TABS tablet Take 1,400 mg by mouth daily.     . diclofenac sodium (VOLTAREN) 1 % GEL Apply 2 g topically 4 (four) times daily. 100 g 1  . glipiZIDE (GLUCOTROL XL) 10 MG 24 hr tablet Take 10 mg by mouth daily with breakfast.    . hydrocortisone (ANUSOL-HC) 25 MG suppository Place 1 suppository (25 mg total) rectally 2 (two) times daily. 12 suppository 0  . Insulin Detemir (LEVEMIR FLEXPEN) 100 UNIT/ML Pen Inject 22-40 Units into the skin 2 (two) times daily. 40 units in the morning and 22 units at night    . lisinopril (ZESTRIL) 40 MG tablet Take 40 mg by mouth daily.    Marland Kitchen losartan (COZAAR) 100 MG tablet Take 100 mg by mouth daily.    Marland Kitchen lovastatin (MEVACOR) 40 MG tablet TAKE 1 TABLET BY MOUTH DAILY AT BEDTIME 30 tablet 5  . Multiple Vitamin (MULTIVITAMIN) tablet Take 1 tablet by mouth every morning.     Marland Kitchen omeprazole (PRILOSEC) 40 MG capsule Take 40 mg by mouth every morning.     . ONE TOUCH ULTRA TEST test strip 1 each by Other route 3 (three) times daily.   2  . venlafaxine XR (EFFEXOR-XR) 150 MG 24 hr capsule Take 150 mg by mouth every morning.     . venlafaxine XR (EFFEXOR-XR) 75 MG 24 hr capsule Take 75 mg by mouth every morning.    . warfarin (COUMADIN) 3 MG tablet Take 6 mg by mouth every evening. Take by mouth as directed by the coumadin clinic     . amLODipine (NORVASC) 5 MG tablet Take 1 tablet (5 mg total) by mouth daily. 180 tablet 3   No current facility-administered  medications for this visit.     Allergies  Allergen Reactions  . Ultram [Tramadol] Nausea And Vomiting    Social History   Socioeconomic History  . Marital status: Widowed    Spouse name: Not on file  . Number of children: 3  . Years of education: Not on file  . Highest education level: Not on file  Occupational History  . Occupation: Air cabin crew  Social Needs  . Financial resource strain: Not on file  . Food insecurity    Worry: Not on file    Inability: Not on file  . Transportation needs    Medical: Not on file    Non-medical: Not on file  Tobacco Use  . Smoking status: Never Smoker  . Smokeless tobacco: Never  Used  Substance and Sexual Activity  . Alcohol use: No  . Drug use: No  . Sexual activity: Never  Lifestyle  . Physical activity    Days per week: Not on file    Minutes per session: Not on file  . Stress: Not on file  Relationships  . Social Herbalist on phone: Not on file    Gets together: Not on file    Attends religious service: Not on file    Active member of club or organization: Not on file    Attends meetings of clubs or organizations: Not on file    Relationship status: Not on file  . Intimate partner violence    Fear of current or ex partner: Not on file    Emotionally abused: Not on file    Physically abused: Not on file    Forced sexual activity: Not on file  Other Topics Concern  . Not on file  Social History Narrative  . Not on file    Family History  Problem Relation Age of Onset  . Cancer Father        prostate  . Hypertension Father   . Heart attack Paternal Grandfather   . Stroke Neg Hx     Review of Systems:  As stated in the HPI and otherwise negative.   BP 110/60   Pulse 70   Ht 5\' 6"  (1.676 m)   Wt 157 lb (71.2 kg)   SpO2 99%   BMI 25.34 kg/m   Physical Examination:  General: Well developed, well nourished, NAD  HEENT: OP clear, mucus membranes moist  SKIN: warm, dry. No  rashes. Neuro: No focal deficits  Musculoskeletal: Muscle strength 5/5 all ext  Psychiatric: Mood and affect normal  Neck: No JVD, no carotid bruits, no thyromegaly, no lymphadenopathy.  Lungs:Clear bilaterally, no wheezes, rhonci, crackles Cardiovascular: Regular rate and rhythm. Systolic murmur.  Abdomen:Soft. Bowel sounds present. Non-tender.  Extremities: No lower extremity edema. Pulses are 2 + in the bilateral DP/PT.  Echo August 2020: 1. The left ventricle has hyperdynamic systolic function, with an ejection fraction of >65%. The cavity size was normal. There is moderate asymmetric left ventricular hypertrophy. Left ventricular diastolic Doppler parameters are consistent with  pseudonormalization. Elevated left atrial and left ventricular end-diastolic pressures.  2. The right ventricle has normal systolic function. The cavity was normal. There is no increase in right ventricular wall thickness. Right ventricular systolic pressure could not be assessed.  3. Left atrial size was mildly dilated.  4. S/P MV repair. The mean MV gradient is normal at 30mmHg. There is no MR. MVA by PHT is 2.13cm2.  5. The tricuspid valve is abnormal.  6. S/P TV repair with normal mean MVG of 66mmHg and no TR.  7. The aortic valve is tricuspid. Moderate thickening of the aortic valve. Moderate calcification of the aortic valve with decreased cusp movement. Doppler of the AV and LVOT cannot be acurately assessed. There is significant mid cavitary and LVOT  turbulence with high gradient. Cannot give accurate AV gradients, peak velocity or AVA based on this study due to sampling issues in the presence of severe BSH, hyperdynamic LVF and AS all contributing to the gradient. There is evidence of a dagger  shaped velocity within the aortic doppler sample making differentiation impossible. Visually there appears to be moderate AS. Consider TEE if clinically indicated to get a better assessment of AS severity.  8. The  aorta is normal unless  otherwise noted.  EKG:  EKG is not ordered today. The ekg ordered today demonstrates   Recent Labs: 11/07/2018: ALT 22; BUN 32; Creatinine, Ser 1.12; Hemoglobin 13.0; Platelets 196; Potassium 5.0; Sodium 138   Lipid Panel No results found for: CHOL, TRIG, HDL, CHOLHDL, VLDL, LDLCALC, LDLDIRECT   Wt Readings from Last 3 Encounters:  11/29/18 157 lb (71.2 kg)  07/11/18 168 lb (76.2 kg)  11/03/16 165 lb (74.8 kg)     Other studies Reviewed: Additional studies/ records that were reviewed today include: . Review of the above records demonstrates:    Assessment and Plan:   1. Atrial fibrillation, paroxysmal: She appears to be in sinus today. Continue coumadin. (of note, Xarelto was changed to coumadin at the time of her valve surgery).   2. Mitral regurgitation: She is s/p MV ring in 2014, stable by echo 2020  3. Tricuspid regurgitation: She is s/p TV ring in 2014. Stable by echo in 2020  4. CAD without angina: Mild CAD by cath in 2014. No chest pain. Continue statin. She has not been on an ASA since she is on coumadin.    5.  Aortic stenosis: Moderate by echo last week but difficult to obtain gradients based on the poor quality of the study. Her mean gradient last year was 36 mmHg. With recent syncopal event and ongoing dizziness, will need to better define her AS. Will arrange a TEE at [facility] 12/12/18. Procedure risks reviewed with pt.   6. HTN: Her BP is controlled on Norvasc and Lisinopril. BP followed in primary care. She reports that she was told by DR. Ashby Dawes today that her creatinine had worsened. She is still on Lisinopril. We will try to get records of her BMET from last week.   7. Dizziness: unclear etiology. Will assess degree of AS. I have encouraged her to push her po intake.    Current medicines are reviewed at length with the patient today.  The patient does not have concerns regarding medicines.  The following changes have been made:  no  change  Labs/ tests ordered today include:   Orders Placed This Encounter  Procedures  . CBC  . Basic metabolic panel  . Protime-INR    Disposition:   FU with me in 2-3  months  Signed, Lauree Chandler, MD 11/29/2018 4:10 PM    [facility] [address], [place], [place]  [address] Phone: [phone]; Fax: [phone]

## 2018-11-29 NOTE — Patient Instructions (Addendum)
Medication Instructions:  Your physician recommends that you continue on your current medications as directed. Please refer to the Current Medication list given to you today.  If you need a refill on your cardiac medications before your next appointment, please call your pharmacy.   Lab work: BMET, CBC and INR today  If you have labs (blood work) drawn today and your tests are completely normal, you will receive your results only by: Marland Kitchen MyChart Message (if you have MyChart) OR . A paper copy in the mail If you have any lab test that is abnormal or we need to change your treatment, we will call you to review the results.  Testing/Procedures: Your physician has requested that you have a TEE. During a TEE, sound waves are used to create images of your heart. It provides your doctor with information about the size and shape of your heart and how well your heart's chambers and valves are working. In this test, a transducer is attached to the end of a flexible tube that's guided down your throat and into your esophagus (the tube leading from you mouth to your stomach) to get a more detailed image of your heart. You are not awake for the procedure. Please see the instruction sheet given to you today. For further information please visit [email or web address].    Follow-Up: Your physician recommends that you schedule a follow-up appointment in: 2 months with Dr. Angelena Form.    Any Other Special Instructions Will Be Listed Below (If Applicable).

## 2018-11-30 LAB — BASIC METABOLIC PANEL
BUN/Creatinine Ratio: 27 (ref 12–28)
BUN: 35 mg/dL — ABNORMAL HIGH (ref 8–27)
CO2: 25 mmol/L (ref 20–29)
Calcium: 10.1 mg/dL (ref 8.7–10.3)
Chloride: 99 mmol/L (ref 96–106)
Creatinine, Ser: 1.31 mg/dL — ABNORMAL HIGH (ref 0.57–1.00)
GFR calc Af Amer: 45 mL/min/{1.73_m2} — ABNORMAL LOW (ref >59)
GFR calc non Af Amer: 39 mL/min/{1.73_m2} — ABNORMAL LOW (ref >59)
Glucose: 165 mg/dL — ABNORMAL HIGH (ref 65–99)
Potassium: 4.9 mmol/L (ref 3.5–5.2)
Sodium: 138 mmol/L (ref 134–144)

## 2018-11-30 LAB — CBC
Hematocrit: 39.4 % (ref 34.0–46.6)
Hemoglobin: 13.3 g/dL (ref 11.1–15.9)
MCH: 31.1 pg (ref 26.6–33.0)
MCHC: 33.8 g/dL (ref 31.5–35.7)
MCV: 92 fL (ref 79–97)
Platelets: 275 10*3/uL (ref 150–450)
RBC: 4.28 x10E6/uL (ref 3.77–5.28)
RDW: 13.1 % (ref 11.7–15.4)
WBC: 7.4 10*3/uL (ref 3.4–10.8)

## 2018-11-30 LAB — PROTIME-INR
INR: 2 — ABNORMAL HIGH (ref 0.8–1.2)
Prothrombin Time: 20.9 s — ABNORMAL HIGH (ref 9.1–12.0)

## 2018-12-04 DIAGNOSIS — Z7901 Long term (current) use of anticoagulants: Secondary | ICD-10-CM | POA: Diagnosis not present

## 2018-12-04 DIAGNOSIS — E1121 Type 2 diabetes mellitus with diabetic nephropathy: Secondary | ICD-10-CM | POA: Diagnosis not present

## 2018-12-04 DIAGNOSIS — I4891 Unspecified atrial fibrillation: Secondary | ICD-10-CM | POA: Diagnosis not present

## 2018-12-05 DIAGNOSIS — E1121 Type 2 diabetes mellitus with diabetic nephropathy: Secondary | ICD-10-CM | POA: Diagnosis not present

## 2018-12-05 DIAGNOSIS — E1065 Type 1 diabetes mellitus with hyperglycemia: Secondary | ICD-10-CM | POA: Diagnosis not present

## 2018-12-05 DIAGNOSIS — Z794 Long term (current) use of insulin: Secondary | ICD-10-CM | POA: Diagnosis not present

## 2018-12-08 ENCOUNTER — Other Ambulatory Visit (HOSPITAL_COMMUNITY)
Admission: RE | Admit: 2018-12-08 | Discharge: 2018-12-08 | Disposition: A | Discharge status: Home / Self Care | Payer: Medicare Other | Source: Ambulatory Visit | Attending: Cardiovascular Disease | Admitting: Cardiovascular Disease

## 2018-12-08 DIAGNOSIS — Z01812 Encounter for preprocedural laboratory examination: Secondary | ICD-10-CM | POA: Diagnosis not present

## 2018-12-08 DIAGNOSIS — Z20828 Contact with and (suspected) exposure to other viral communicable diseases: Secondary | ICD-10-CM | POA: Insufficient documentation

## 2018-12-09 LAB — NOVEL CORONAVIRUS, NAA (HOSP ORDER, SEND-OUT TO REF LAB; TAT 18-24 HRS): SARS-CoV-2, NAA: NOT DETECTED

## 2018-12-11 ENCOUNTER — Telehealth: Payer: Self-pay | Admitting: Cardiovascular Disease

## 2018-12-11 NOTE — Telephone Encounter (Signed)
Follow up  ° ° °Patient is returning call.  °

## 2018-12-11 NOTE — Telephone Encounter (Signed)
Patient is scheduled for a TEE Tomorrow, and would like to know what medications she can and can not take before her procedure. Please advise

## 2018-12-11 NOTE — Telephone Encounter (Signed)
I spoke with pt. She reports glipizide has been stopped and she takes levemir 32 units every morning.  I told her she should not take Levemir the morning of the TEE.  She has been changed to Xarelto and I instructed her to take this as ordered.

## 2018-12-11 NOTE — Telephone Encounter (Signed)
I spoke with pt  

## 2018-12-11 NOTE — Progress Notes (Signed)
Spoke with pt who stated that she has remained in quarantine and is not experiencing and s/s of COVID sine her screening. All questions answered appropriately. Jobe Igo, RN

## 2018-12-12 ENCOUNTER — Encounter (HOSPITAL_COMMUNITY)
Admission: RE | Disposition: A | Discharge status: Home / Self Care | Payer: Medicare Other | Source: Home / Self Care | Attending: Cardiovascular Disease

## 2018-12-12 ENCOUNTER — Encounter (HOSPITAL_COMMUNITY): Payer: Self-pay | Admitting: *Deleted

## 2018-12-12 ENCOUNTER — Other Ambulatory Visit: Payer: Self-pay

## 2018-12-12 ENCOUNTER — Ambulatory Visit (HOSPITAL_COMMUNITY): Admission: RE | Admit: 2018-12-12 | Payer: Medicare Other | Source: Ambulatory Visit | Admitting: Cardiovascular Disease

## 2018-12-12 ENCOUNTER — Ambulatory Visit (HOSPITAL_COMMUNITY)
Admission: RE | Admit: 2018-12-12 | Discharge: 2018-12-12 | Disposition: A | Discharge status: Home / Self Care | Payer: Medicare Other | Attending: Cardiovascular Disease | Admitting: Cardiovascular Disease

## 2018-12-12 DIAGNOSIS — K219 Gastro-esophageal reflux disease without esophagitis: Secondary | ICD-10-CM | POA: Insufficient documentation

## 2018-12-12 DIAGNOSIS — R42 Dizziness and giddiness: Secondary | ICD-10-CM | POA: Insufficient documentation

## 2018-12-12 DIAGNOSIS — I2510 Atherosclerotic heart disease of native coronary artery without angina pectoris: Secondary | ICD-10-CM | POA: Diagnosis not present

## 2018-12-12 DIAGNOSIS — Z794 Long term (current) use of insulin: Secondary | ICD-10-CM | POA: Diagnosis not present

## 2018-12-12 DIAGNOSIS — Z79899 Other long term (current) drug therapy: Secondary | ICD-10-CM | POA: Diagnosis not present

## 2018-12-12 DIAGNOSIS — E1136 Type 2 diabetes mellitus with diabetic cataract: Secondary | ICD-10-CM | POA: Insufficient documentation

## 2018-12-12 DIAGNOSIS — I480 Paroxysmal atrial fibrillation: Secondary | ICD-10-CM | POA: Diagnosis not present

## 2018-12-12 DIAGNOSIS — I350 Nonrheumatic aortic (valve) stenosis: Secondary | ICD-10-CM | POA: Insufficient documentation

## 2018-12-12 DIAGNOSIS — I69398 Other sequelae of cerebral infarction: Secondary | ICD-10-CM | POA: Insufficient documentation

## 2018-12-12 DIAGNOSIS — Z7901 Long term (current) use of anticoagulants: Secondary | ICD-10-CM | POA: Insufficient documentation

## 2018-12-12 DIAGNOSIS — Z5309 Procedure and treatment not carried out because of other contraindication: Secondary | ICD-10-CM | POA: Insufficient documentation

## 2018-12-12 DIAGNOSIS — M170 Bilateral primary osteoarthritis of knee: Secondary | ICD-10-CM | POA: Diagnosis not present

## 2018-12-12 DIAGNOSIS — H539 Unspecified visual disturbance: Secondary | ICD-10-CM | POA: Diagnosis not present

## 2018-12-12 DIAGNOSIS — I10 Essential (primary) hypertension: Secondary | ICD-10-CM | POA: Insufficient documentation

## 2018-12-12 LAB — GLUCOSE, CAPILLARY: Glucose-Capillary: 152 mg/dL — ABNORMAL HIGH (ref 70–99)

## 2018-12-12 SURGERY — CANCELLED PROCEDURE

## 2018-12-12 MED ORDER — LISINOPRIL 20 MG PO TABS: 20.0000 mg | ORAL_TABLET | Freq: Every day | ORAL | 3 refills | Status: DC

## 2018-12-12 MED ORDER — SODIUM CHLORIDE 0.9 % IV BOLUS: 500.0000 mL | Freq: Once | INTRAVENOUS | Status: DC

## 2018-12-12 MED ORDER — MIDAZOLAM HCL (PF) 5 MG/ML IJ SOLN
INTRAMUSCULAR | Status: AC
  Filled 2018-12-12: qty 2

## 2018-12-12 MED ORDER — FENTANYL CITRATE (PF) 100 MCG/2ML IJ SOLN
INTRAMUSCULAR | Status: AC
  Filled 2018-12-12: qty 2

## 2018-12-12 MED ORDER — SODIUM CHLORIDE 0.9 % IV SOLN
INTRAVENOUS | Status: DC
  Administered 2018-12-12: 10:00:00 via INTRAVENOUS

## 2018-12-12 NOTE — Progress Notes (Signed)
Patient's SBP stayed below 100 during pre-proced and upon entering procedure room, dipping into 80's.  Dr Acie Fredrickson spoke with primary, adjusted BP meds, and will do procedure tomorrow.  Patient understands change in meds. Daughter Renee Ramus was informed of new plan and understands.

## 2018-12-12 NOTE — Interval H&P Note (Signed)
History and Physical Interval Note:  12/12/2018 10:44 AM  Rachel Santos  has presented today for surgery, with the diagnosis of AORTIC STENOSIS.  The various methods of treatment have been discussed with the patient and family. After consideration of risks, benefits and other options for treatment, the patient has consented to  Procedure(s): TRANSESOPHAGEAL ECHOCARDIOGRAM (TEE) (N/A) as a surgical intervention.  The patient's history has been reviewed, patient examined, no change in status, stable for surgery.  I have reviewed the patient's chart and labs.  Questions were answered to the patient's satisfaction.     Mertie Moores

## 2018-12-13 ENCOUNTER — Ambulatory Visit (HOSPITAL_COMMUNITY)
Admission: RE | Admit: 2018-12-13 | Discharge: 2018-12-13 | Disposition: A | Discharge status: Home / Self Care | Payer: Medicare Other | Attending: Internal Medicine | Admitting: Internal Medicine

## 2018-12-13 ENCOUNTER — Ambulatory Visit (HOSPITAL_COMMUNITY): Admission: RE | Admit: 2018-12-13 | Payer: Medicare Other | Source: Ambulatory Visit | Admitting: Internal Medicine

## 2018-12-13 ENCOUNTER — Telehealth: Payer: Self-pay | Admitting: Cardiovascular Disease

## 2018-12-13 ENCOUNTER — Other Ambulatory Visit: Payer: Self-pay

## 2018-12-13 ENCOUNTER — Encounter (HOSPITAL_COMMUNITY)
Admission: RE | Disposition: A | Discharge status: Home / Self Care | Payer: Self-pay | Source: Home / Self Care | Attending: Internal Medicine

## 2018-12-13 ENCOUNTER — Encounter (HOSPITAL_COMMUNITY): Payer: Self-pay | Admitting: *Deleted

## 2018-12-13 DIAGNOSIS — Z5309 Procedure and treatment not carried out because of other contraindication: Secondary | ICD-10-CM | POA: Insufficient documentation

## 2018-12-13 DIAGNOSIS — I350 Nonrheumatic aortic (valve) stenosis: Secondary | ICD-10-CM | POA: Diagnosis not present

## 2018-12-13 SURGERY — CANCELLED PROCEDURE

## 2018-12-13 MED ORDER — SODIUM CHLORIDE 0.9 % IV SOLN
INTRAVENOUS | Status: DC
  Administered 2018-12-13: 500 mL via INTRAVENOUS

## 2018-12-13 MED ORDER — DIPHENHYDRAMINE HCL 50 MG/ML IJ SOLN
INTRAMUSCULAR | Status: AC
  Filled 2018-12-13: qty 1

## 2018-12-13 MED ORDER — FENTANYL CITRATE (PF) 100 MCG/2ML IJ SOLN
INTRAMUSCULAR | Status: AC
  Filled 2018-12-13: qty 2

## 2018-12-13 MED ORDER — MIDAZOLAM HCL (PF) 5 MG/ML IJ SOLN
INTRAMUSCULAR | Status: AC
  Filled 2018-12-13: qty 1

## 2018-12-13 NOTE — Telephone Encounter (Signed)
She is there for a TEE but this was cancelled by Dr. Debara Pickett today. He has updated the family of the plan.

## 2018-12-13 NOTE — Progress Notes (Signed)
   After reviewing the echo images personally and evaluating the patient, I feel that the risk of TEE vs. Benefit of additional information is not in favor of the procedure. She continues to have low diastolic BP despite holding her BP meds. Exam is significant for severe aortic stenosis with a loud systolic ejection murmur which is late-peaking and no second heart sound. I personally reviewed the images and it appears there is a pedoff image at the end of the study which demonstrates a mean gradient of 60 mmHg - this is clearly severe, if not critical aortic stenosis. Case discussed with Dr. Angelena Form - he agrees that we can cancel TEE and will arrange outpatient follow-up to discuss TAVR. I have spoke with the patient's daughter and informed her of this.  Pixie Casino, MD, [facility] [place] Director of the Advanced Lipid Disorders &  Cardiovascular Risk Reduction Clinic Diplomate of the American Board of Clinical Lipidology Attending Cardiologist  Direct Dial: [phone]  Fax: [phone]  Website:  [email or web address]

## 2018-12-13 NOTE — Telephone Encounter (Signed)
Can we call Ms. Kichline and put her down to see me on 12/17/18 at 8:45 am? Please see my MyChart message.   Thanks,  Gerald Stabs

## 2018-12-13 NOTE — H&P (Signed)
   INTERVAL PROCEDURE H&P  History and Physical Interval Note:  12/13/2018 9:59 AM  Rachel Santos has presented today for their planned procedure. The various methods of treatment have been discussed with the patient and family. After consideration of risks, benefits and other options for treatment, the patient has consented to the procedure.  The patients' outpatient history has been reviewed, patient examined, and no change in status from most recent office note within the past 30 days. I have reviewed the patients' chart and labs and will proceed as planned. Questions were answered to the patient's satisfaction.   Rachel Casino, MD, [facility], [place] Director of the Advanced Lipid Disorders &  Cardiovascular Risk Reduction Clinic Diplomate of the American Board of Clinical Lipidology Attending Cardiologist  Direct Dial: [phone]  Fax: [phone]  Website:  [email or web address] Rachel Santos 12/13/2018, 9:59 AM

## 2018-12-13 NOTE — Telephone Encounter (Signed)
I spoke with pt who gave me permission to speak with her daughter Rachel Santos if needed.  Pt is with her daughter Rachel Santos now. Appt made for pt to see Dr Angelena Form on 12/17/18 at 8:45 for TAVR consult. Pt aware one person may accompany her to this appointment.

## 2018-12-13 NOTE — Telephone Encounter (Signed)
New Message    Patient's daughter calling in about mother to find out what procedure Dr. Angelena Form is going to perform on her.  She is currently admitted in the hospital and she's very worried.  Please give her a call to advise.

## 2018-12-13 NOTE — Progress Notes (Signed)
Pt. Continues to have soft BP around with last being 109/43. Hilty MD made aware debating transthoracic [place] over TEE, speaking with patient and other cardiologist. Pt. Agrees, non-symptomatic of BP, will continue to monitor.

## 2018-12-16 NOTE — Progress Notes (Signed)
[facility] Consult Note  Chief Complaint  Patient presents with  . 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 I saw her as a new patient for evaluation of atrial fibrillation and severe MR on 07/03/12. She was diagnosed with atrial fibrillation April of 2014 as well as severe MR and TR. LV function has always been normal. She had a TEE guided cardioversion in May 2016 with restoration of sinus rhythm. Her MR was noted to be central and severe. The LV cavity was not dilated. LV systolic function was normal. There was moderate to severe TR as well. She was felt to be symptomatic from her mitral valve disease. Cardiac cath on 09/05/12 with mild CAD. She underwent placement of mitral valve ring and tricuspid valve ring as well as MAZE procedure per Dr. Roxy Manns on 10/11/12. Post-operative course complicated by recurrence of atrial fibrillation with bradycardia. She was seen by Dr. Caryl Comes as an EP consult and pacemaker not recommended. She has been on coumadin. Carotid artery dopplers June 2015 with mild bilateral disease. She was seen in the ED at [facility] 11/07/18 after a syncopal episode. Echo 11/23/18 with LVEF over 65%. Moderate LVH. Stable mitral and tricuspid repair. There appeared to be severe AS. The aortic valve is thickened and calcified. Mean gradient over 50 mmHg.  It was unclear after the initial interpretation of the echo by Dr. Radford Pax is her AS was severe and a TEE was recommended. She came in for the TEE on back to back days and her BP low so the test was cancelled each time. Lisinopril and Norvasc are now on hold. BP at home has been [id number] to 0000000 systolic over the past few days.   She is here today for follow up. The patient denies  any chest pain, dyspnea, palpitations, lower extremity edema, orthopnea or PND. She continues to have fatigue and dizziness. Her dizziness is slightly better off of anti-hypertensive medications. She has had no recent syncopal events. She lives alone but her daughter lives in an apartment across from her. She has full dentures.   Primary Care Physician: Merrilee Seashore, MD   Past Medical History:  Diagnosis Date  . Arthritis    knees  . Atrial fibrillation ([place])   . Cataracts, bilateral   . Diabetes mellitus   . GERD (gastroesophageal reflux disease)   . Hypertension   . Mitral regurgitation   . PONV (postoperative nausea and vomiting)   . S/P Maze operation for atrial fibrillation 10/11/2012   Complete bilateral atrial lesion set using bipolar radiofrequency and cryothermy ablation with clipping of LA appendage  . 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   48mm Sorin Memo 3D ring annuloplasty  . 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   58mm Sorin Memo 3D ring annuloplasty with 26 mm Edwards mc3 tricuspid ring annuloplasty   . Shortness of breath    with activity  . Stroke [facility])    mini stroke with vision problems  . Tricuspid regurgitation     Past Surgical History:  Procedure Laterality Date  . CARDIAC CATHETERIZATION  09-05-12  . CARDIOVERSION N/A 07/31/2012   Procedure: CARDIOVERSION;  Surgeon: Josue Hector, MD;  Location: [facility];  Service: Cardiovascular;  Laterality: N/A;  .  CESAREAN SECTION     x 2  . COLONOSCOPY WITH PROPOFOL N/A 11/28/2014   Procedure: COLONOSCOPY WITH PROPOFOL;  Surgeon: Carol Ada, MD;  Location: [facility];  Service: Endoscopy;  Laterality: N/A;  . FLEXIBLE SIGMOIDOSCOPY N/A 12/18/2015   Procedure: FLEXIBLE SIGMOIDOSCOPY;  Surgeon: Carol Ada, MD;  Location: [facility]OS[facility];  Service: Endoscopy;  Laterality: N/A;  . INTRAOPERATIVE TRANSESOPHAGEAL ECHOCARDIOGRAM N/A 10/11/2012   Procedure: INTRAOPERATIVE TRANSESOPHAGEAL ECHOCARDIOGRAM;  Surgeon:  Rexene Alberts, MD;  Location: [place];  Service: Open Heart Surgery;  Laterality: N/A;  . MASTECTOMY Left 1982  . MAZE N/A 10/11/2012   Procedure: MAZE;  Surgeon: Rexene Alberts, MD;  Location: [place];  Service: Open Heart Surgery;  Laterality: N/A;  . MITRAL VALVE REPAIR N/A 10/11/2012   Procedure: MITRAL VALVE REPAIR (MVR);  Surgeon: Rexene Alberts, MD;  Location: [place];  Service: Open Heart Surgery;  Laterality: N/A;  . TEE WITHOUT CARDIOVERSION N/A 07/31/2012   Procedure: TRANSESOPHAGEAL ECHOCARDIOGRAM (TEE);  Surgeon: Josue Hector, MD;  Location: [facility] ENDOSCOPY;  Service: Cardiovascular;  Laterality: N/A;  . TRICUSPID VALVE REPLACEMENT N/A 10/11/2012   Procedure: TRICUSPID VALVE REPAIR;  Surgeon: Rexene Alberts, MD;  Location: [place];  Service: Open Heart Surgery;  Laterality: N/A;  . TUBAL LIGATION      Current Outpatient Medications  Medication Sig Dispense Refill  . acetaminophen (TYLENOL) 325 MG tablet Take 650 mg by mouth every 6 (six) hours as needed for moderate pain or headache.    . busPIRone (BUSPAR) 10 MG tablet Take 10 mg by mouth 2 (two) times daily.     . Calcium Carb-Cholecalciferol (CALCIUM 600+D3 PO) Take 1 tablet by mouth 2 (two) times daily.    . diclofenac sodium (VOLTAREN) 1 % GEL Apply 2 g topically 4 (four) times daily. 100 g 1  . hydrocortisone (ANUSOL-HC) 25 MG suppository Place 1 suppository (25 mg total) rectally 2 (two) times daily. 12 suppository 0  . Insulin Detemir (LEVEMIR FLEXPEN) 100 UNIT/ML Pen Inject 32 Units into the skin daily.     Marland Kitchen lovastatin (MEVACOR) 40 MG tablet TAKE 1 TABLET BY MOUTH DAILY AT BEDTIME 30 tablet 5  . Multiple Vitamin (MULTIVITAMIN WITH MINERALS) TABS tablet Take 1 tablet by mouth daily.    Marland Kitchen omeprazole (PRILOSEC) 40 MG capsule Take 40 mg by mouth daily before breakfast.     . Rivaroxaban (XARELTO) 15 MG TABS tablet Take 15 mg by mouth daily with supper.    . Semaglutide,0.25 or 0.5MG /DOS, (OZEMPIC, 0.25 OR 0.5 MG/DOSE,) 2 MG/1.5ML  SOPN Inject 0.5 mg into the skin every Sunday.    . venlafaxine XR (EFFEXOR-XR) 150 MG 24 hr capsule Take 150 mg by mouth daily with breakfast. Take with 75 mg capsule    . venlafaxine XR (EFFEXOR-XR) 75 MG 24 hr capsule Take 75 mg by mouth daily with breakfast. Take with 150 mg capsule     No current facility-administered medications for this visit.     Allergies  Allergen Reactions  . Ultram [Tramadol] Nausea And Vomiting    Social History   Socioeconomic History  . Marital status: Widowed    Spouse name: Not on file  . Number of children: 3  . Years of education: Not on file  . Highest education level: Not on file  Occupational History  . Occupation: Air cabin crew  Social Needs  . Financial resource strain: Not on file  . Food insecurity    Worry: Not on file  Inability: Not on file  . Transportation needs    Medical: Not on file    Non-medical: Not on file  Tobacco Use  . Smoking status: Never Smoker  . Smokeless tobacco: Never Used  Substance and Sexual Activity  . Alcohol use: No  . Drug use: No  . Sexual activity: Never  Lifestyle  . Physical activity    Days per week: Not on file    Minutes per session: Not on file  . Stress: Not on file  Relationships  . Social Herbalist on phone: Not on file    Gets together: Not on file    Attends religious service: Not on file    Active member of club or organization: Not on file    Attends meetings of clubs or organizations: Not on file    Relationship status: Not on file  . Intimate partner violence    Fear of current or ex partner: Not on file    Emotionally abused: Not on file    Physically abused: Not on file    Forced sexual activity: Not on file  Other Topics Concern  . Not on file  Social History Narrative  . Not on file    Family History  Problem Relation Age of Onset  . Cancer Father        prostate  . Hypertension Father   . Heart attack Paternal Grandfather    . Stroke Neg Hx     Review of Systems:  As stated in the HPI and otherwise negative.   BP (!) 124/54   Pulse 65   Ht 5\' 6"  (1.676 m)   Wt 156 lb 6.4 oz (70.9 kg)   SpO2 98%   BMI 25.24 kg/m   Physical Examination:  General: Well developed, well nourished, NAD  HEENT: OP clear, mucus membranes moist  SKIN: warm, dry. No rashes. Neuro: No focal deficits  Musculoskeletal: Muscle strength 5/5 all ext  Psychiatric: Mood and affect normal  Neck: No JVD, no carotid bruits, no thyromegaly, no lymphadenopathy.  Lungs:Clear bilaterally, no wheezes, rhonci, crackles Cardiovascular: Regular rate and rhythm. Loud systolic murmur.  Abdomen:Soft. Bowel sounds present. Non-tender.  Extremities: No lower extremity edema. Pulses are 2 + in the bilateral DP/PT.  Echo August 2020: 1. The left ventricle has hyperdynamic systolic function, with an ejection fraction of >65%. The cavity size was normal. There is moderate asymmetric left ventricular hypertrophy. Left ventricular diastolic Doppler parameters are consistent with  pseudonormalization. Elevated left atrial and left ventricular end-diastolic pressures.  2. The right ventricle has normal systolic function. The cavity was normal. There is no increase in right ventricular wall thickness. Right ventricular systolic pressure could not be assessed.  3. Left atrial size was mildly dilated.  4. S/P MV repair. The mean MV gradient is normal at 29mmHg. There is no MR. MVA by PHT is 2.13cm2.  5. The tricuspid valve is abnormal.  6. S/P TV repair with normal mean MVG of 62mmHg and no TR.  7. The aortic valve is tricuspid. Moderate thickening of the aortic valve. Moderate calcification of the aortic valve with decreased cusp movement. Doppler of the AV and LVOT cannot be acurately assessed. There is significant mid cavitary and LVOT  turbulence with high gradient. Cannot give accurate AV gradients, peak velocity or AVA based on this study due to sampling  issues in the presence of severe BSH, hyperdynamic LVF and AS all contributing to the gradient. There is evidence of  a dagger  shaped velocity within the aortic doppler sample making differentiation impossible. Visually there appears to be moderate AS. Consider TEE if clinically indicated to get a better assessment of AS severity.  8. The aorta is normal unless otherwise noted.  FINDINGS  Left Ventricle: The left ventricle has hyperdynamic systolic function, with an ejection fraction of >65%. The cavity size was normal. There is moderate asymmetric left ventricular hypertrophy. Left ventricular diastolic Doppler parameters are consistent  with pseudonormalization. Elevated left atrial and left ventricular end-diastolic pressures Definity contrast agent was given IV to delineate the left ventricular endocardial borders.  Right Ventricle: The right ventricle has normal systolic function. The cavity was normal. There is no increase in right ventricular wall thickness. Right ventricular systolic pressure could not be assessed.  Left Atrium: Left atrial size was mildly dilated.  Right Atrium: Right atrial size was normal in size.  Interatrial Septum: No atrial level shunt detected by color flow Doppler.  Pericardium: There is no evidence of pericardial effusion.  Mitral Valve: The mitral valve has been repaired/replaced. Mitral valve regurgitation was not assessed by color flow Doppler. S/P MV repair. The mean MV gradient is normal at 57mmHg. There is no MR. MVA by PHT is 2.13cm2.  Tricuspid Valve: The tricuspid valve is abnormal. Tricuspid valve regurgitation was not visualized by color flow Doppler. S/P TV repair with normal mean MVG of 47mmHg and no TR.  Aortic Valve: The aortic valve is tricuspid Moderate thickening of the aortic valve. Moderate calcification of the aortic valve. Aortic valve regurgitation was not visualized by color flow Doppler.  Pulmonic Valve: The pulmonic valve was  normal in structure. Pulmonic valve regurgitation is trivial by color flow Doppler.  Aorta: The aorta is normal unless otherwise noted.  Venous: The inferior vena cava was not well visualized.  Compared to previous exam: 11/16/17 EF 60-65%. MV 22mmHg peak. TV 50mmHg mean. Moderate AS 24mmHg mean, 20mmHg peak.    +--------------+--------++ LEFT VENTRICLE         +----------------+---------++ +--------------+--------++ Diastology                PLAX 2D                +----------------+---------++ +--------------+--------++ LV e' lateral:  8.81 cm/s LVIDd:        3.40 cm  +----------------+---------++ +--------------+--------++ LV E/e' lateral:14.3      LVIDs:        2.30 cm  +----------------+---------++ +--------------+--------++ LV e' medial:   3.56 cm/s LV PW:        1.00 cm  +----------------+---------++ +--------------+--------++ LV E/e' medial: 35.4      LV IVS:       1.30 cm  +----------------+---------++ +--------------+--------++ LVOT diam:    2.00 cm  +--------------+--------++ LV SV:        29 ml    +--------------+--------++ LV SV Index:  15.43    +--------------+--------++ LVOT Area:    3.14 cm +--------------+--------++                        +--------------+--------++  +---------------+---------++ RIGHT VENTRICLE          +---------------+---------++ RV S prime:    6.18 cm/s +---------------+---------++  +---------------+-------++-----------++ LEFT ATRIUM           Index       +---------------+-------++-----------++ LA diam:       4.00 cm2.15 cm/m  +---------------+-------++-----------++ LA Vol (A2C):  67.1 ml36.13 ml/m +---------------+-------++-----------++ LA Vol (A4C):  72.4 ml38.99 ml/m +---------------+-------++-----------++ LA Biplane Vol:72.0 ml38.77 ml/m +---------------+-------++-----------++ +------------+---------++-----------++  RIGHT ATRIUM         Index       +------------+---------++-----------++ RA Area:    13.90 cm            +------------+---------++-----------++ RA Volume:  31.10 ml 16.75 ml/m +------------+---------++-----------++  +------------------+------------++ AORTIC VALVE                   +------------------+------------++ AV Area (Vmax):   0.92 cm     +------------------+------------++ AV Area (Vmean):  1.00 cm     +------------------+------------++ AV Area (VTI):    1.01 cm     +------------------+------------++ AV Vmax:          479.33 cm/s  +------------------+------------++ AV Vmean:         310.800 cm/s +------------------+------------++ AV VTI:           0.940 m      +------------------+------------++ AV Peak Grad:     91.9 mmHg    +------------------+------------++ AV Mean Grad:     51.3 mmHg    +------------------+------------++ LVOT Vmax:        141.00 cm/s  +------------------+------------++ LVOT Vmean:       99.100 cm/s  +------------------+------------++ LVOT VTI:         0.302 m      +------------------+------------++ LVOT/AV VTI ratio:0.32         +------------------+------------++   +-------------+-------++ AORTA                +-------------+-------++ Ao Root diam:3.20 cm +-------------+-------++ Ao Asc diam: 3.30 cm +-------------+-------++  +--------------+-----------++ +---------------+---------++ MITRAL VALVE              TRICUSPID VALVE          +--------------+-----------++ +---------------+---------++ MV Area (PHT):2.13 cm    TV Peak grad:  3.8 mmHg  +--------------+-----------++ +---------------+---------++ MV Peak grad: 5.9 mmHg    TV Mean grad:  2.0 mmHg  +--------------+-----------++ +---------------+---------++ MV Mean grad: 3.0 mmHg    TV Vmax:       0.97 m/s  +--------------+-----------++ +---------------+---------++ MV  Vmax:      1.21 m/s    TV Vmean:      67.1 cm/s +--------------+-----------++ +---------------+---------++ MV Vmean:     86.8 cm/s   TV VTI:        0.34 msec +--------------+-----------++ +---------------+---------++ MV VTI:       0.43 m      +--------------+-----------++ +--------------+-------+ MV PHT:       103.24 msec SHUNTS                +--------------+-----------++ +--------------+-------+ MV Decel Time:356 msec    Systemic VTI: 0.30 m  +--------------+-----------++ +--------------+-------+ +--------------+-----------++ Systemic Diam:2.00 cm MV E velocity:126.00 cm/s +--------------+-------+ +--------------+-----------++ MV A velocity:99.20 cm/s  +--------------+-----------++ MV E/A ratio: 1.27        +--------------+-----------++   EKG:  EKG is ordered today. The ekg ordered today demonstrates Sinus, rate 65 bpm.   Recent Labs: 11/07/2018: ALT 22 11/29/2018: BUN 35; Creatinine, Ser 1.31; Hemoglobin 13.3; Platelets 275; Potassium 4.9; Sodium 138   Lipid Panel No results found for: CHOL, TRIG, HDL, CHOLHDL, VLDL, LDLCALC, LDLDIRECT   Wt Readings from Last 3 Encounters:  12/17/18 156 lb 6.4 oz (70.9 kg)  12/12/18 156 lb (70.8 kg)  11/29/18 157 lb (71.2 kg)     Other studies Reviewed: Additional studies/ records that were reviewed today include: . Review of the above records demonstrates:  Assessment and Plan:   1. Aortic stenosis: She has severe, stage D aortic valve stenosis. I have personally reviewed the echo images. The aortic valve is thickened, calcified with limited leaflet mobility. I think she would benefit from AVR. Given advanced age, she is not a good candidate for conventional AVR by surgical approach. I think she may be a good candidate for TAVR.   STS Risk Score:  Risk of Mortality: 3.391% Renal Failure: 2.893% Permanent Stroke: 2.275% Prolonged Ventilation: 8.179% DSW Infection: 0.085%  Reoperation: 3.125% Morbidity or Mortality: 13.696% Short Length of Stay: 18.253% Long Length of Stay: 7.121%   I have reviewed the natural history of aortic stenosis with the patient and their family members  who are present today. We have discussed the limitations of medical therapy and the poor prognosis associated with symptomatic aortic stenosis. We have reviewed potential treatment options, including palliative medical therapy, conventional surgical aortic valve replacement, and transcatheter aortic valve replacement. We discussed treatment options in the context of the patient's specific comorbid medical conditions.   She would like to proceed with planning for TAVR. I will arrange a right and left heart catheterization at [facility] 12/24/18. Risks and benefits of the cath procedure and the valve procedure reviewed with the patient. After the cath, she will have a cardiac CT, CTA of the chest/abdomen and pelvis, PFTs and will then be referred to see Dr. Roxy Manns who performed her prior mitral and tricuspid surgeries.   2. Mitral regurgitation: She is s/p MV ring in 2014, stable by echo 2020  3. Tricuspid regurgitation: She is s/p TV ring in 2014. Stable by echo in 2020  4. CAD without angina: Mild CAD by cath in 2014. No chest pain. Continue statin. She has not been on an ASA since she is on coumadin.    5. HTN: BP stable off of Lisinopril and Norvasc.   6. Atrial fibrillation, paroxysmal: She is in sinus today. Continue Xarelto.     Current medicines are reviewed at length with the patient today.  The patient does not have concerns regarding medicines.  The following changes have been made:  no change  Labs/ tests ordered today include:   Orders Placed This Encounter  Procedures  . CT CORONARY MORPH W/CTA COR W/SCORE W/CA W/CM &/OR WO/CM  . CT ANGIO CHEST AORTA W &/OR WO CONTRAST  . CT ANGIO ABDOMEN PELVIS  W &/OR WO CONTRAST  . CBC with Differential/Platelet  . Basic metabolic  panel  . EKG 12-Lead  . VAS US CAROTID    Disposition:   FU with me after her TAVR.   Signed, Lauree Chandler, MD 12/17/2018 10:20 AM    [facility] [place], [place], [place]  [address] Phone: [phone]; Fax: [phone]

## 2018-12-16 NOTE — H&P (View-Only) (Signed)
[facility]ural Heart Clinic Consult Note  Chief Complaint  Patient presents with  . 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 I saw her as a new patient for evaluation of atrial fibrillation and severe MR on 07/03/12. She was diagnosed with atrial fibrillation April of 2014 as well as severe MR and TR. LV function has always been normal. She had a TEE guided cardioversion in May 2016 with restoration of sinus rhythm. Her MR was noted to be central and severe. The LV cavity was not dilated. LV systolic function was normal. There was moderate to severe TR as well. She was felt to be symptomatic from her mitral valve disease. Cardiac cath on 09/05/12 with mild CAD. She underwent placement of mitral valve ring and tricuspid valve ring as well as MAZE procedure per Dr. Roxy Manns on 10/11/12. Post-operative course complicated by recurrence of atrial fibrillation with bradycardia. She was seen by Dr. Caryl Comes as an EP consult and pacemaker not recommended. She has been on coumadin. Carotid artery dopplers June 2015 with mild bilateral disease. She was seen in the ED at [facility] 11/07/18 after a syncopal episode. Echo 11/23/18 with LVEF over 65%. Moderate LVH. Stable mitral and tricuspid repair. There appeared to be severe AS. The aortic valve is thickened and calcified. Mean gradient over 50 mmHg.  It was unclear after the initial interpretation of the echo by Dr. Radford Pax is her AS was severe and a TEE was recommended. She came in for the TEE on back to back days and her BP low so the test was cancelled each time. Lisinopril and Norvasc are now on hold. BP at home has been [id number] to 0000000 systolic over the past few days.   She is here today for follow up. The patient denies  any chest pain, dyspnea, palpitations, lower extremity edema, orthopnea or PND. She continues to have fatigue and dizziness. Her dizziness is slightly better off of anti-hypertensive medications. She has had no recent syncopal events. She lives alone but her daughter lives in an apartment across from her. She has full dentures.   Primary Care Physician: Merrilee Seashore, MD   Past Medical History:  Diagnosis Date  . Arthritis    knees  . Atrial fibrillation ([place])   . Cataracts, bilateral   . Diabetes mellitus   . GERD (gastroesophageal reflux disease)   . Hypertension   . Mitral regurgitation   . PONV (postoperative nausea and vomiting)   . S/P Maze operation for atrial fibrillation 10/11/2012   Complete bilateral atrial lesion set using bipolar radiofrequency and cryothermy ablation with clipping of LA appendage  . 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   89mm Sorin Memo 3D ring annuloplasty  . 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   19mm Sorin Memo 3D ring annuloplasty with 26 mm Edwards mc3 tricuspid ring annuloplasty   . Shortness of breath    with activity  . Stroke [facility])    mini stroke with vision problems  . Tricuspid regurgitation     Past Surgical History:  Procedure Laterality Date  . CARDIAC CATHETERIZATION  09-05-12  . CARDIOVERSION N/A 07/31/2012   Procedure: CARDIOVERSION;  Surgeon: Josue Hector, MD;  Location: [place];  Service: Cardiovascular;  Laterality: N/A;  .  CESAREAN SECTION     x 2  . COLONOSCOPY WITH PROPOFOL N/A 11/28/2014   Procedure: COLONOSCOPY WITH PROPOFOL;  Surgeon: Carol Ada, MD;  Location: [facility];  Service: Endoscopy;  Laterality: N/A;  . FLEXIBLE SIGMOIDOSCOPY N/A 12/18/2015   Procedure: FLEXIBLE SIGMOIDOSCOPY;  Surgeon: Carol Ada, MD;  Location: [facility];  Service: Endoscopy;  Laterality: N/A;  . INTRAOPERATIVE TRANSESOPHAGEAL ECHOCARDIOGRAM N/A 10/11/2012   Procedure: INTRAOPERATIVE TRANSESOPHAGEAL ECHOCARDIOGRAM;  Surgeon:  Rexene Alberts, MD;  Location: [place];  Service: Open Heart Surgery;  Laterality: N/A;  . MASTECTOMY Left 1982  . MAZE N/A 10/11/2012   Procedure: MAZE;  Surgeon: Rexene Alberts, MD;  Location: [place];  Service: Open Heart Surgery;  Laterality: N/A;  . MITRAL VALVE REPAIR N/A 10/11/2012   Procedure: MITRAL VALVE REPAIR (MVR);  Surgeon: Rexene Alberts, MD;  Location: [place];  Service: Open Heart Surgery;  Laterality: N/A;  . TEE WITHOUT CARDIOVERSION N/A 07/31/2012   Procedure: TRANSESOPHAGEAL ECHOCARDIOGRAM (TEE);  Surgeon: Josue Hector, MD;  Location: [facility] ENDOSCOPY;  Service: Cardiovascular;  Laterality: N/A;  . TRICUSPID VALVE REPLACEMENT N/A 10/11/2012   Procedure: TRICUSPID VALVE REPAIR;  Surgeon: Rexene Alberts, MD;  Location: [place];  Service: Open Heart Surgery;  Laterality: N/A;  . TUBAL LIGATION      Current Outpatient Medications  Medication Sig Dispense Refill  . acetaminophen (TYLENOL) 325 MG tablet Take 650 mg by mouth every 6 (six) hours as needed for moderate pain or headache.    . busPIRone (BUSPAR) 10 MG tablet Take 10 mg by mouth 2 (two) times daily.     . Calcium Carb-Cholecalciferol (CALCIUM 600+D3 PO) Take 1 tablet by mouth 2 (two) times daily.    . diclofenac sodium (VOLTAREN) 1 % GEL Apply 2 g topically 4 (four) times daily. 100 g 1  . hydrocortisone (ANUSOL-HC) 25 MG suppository Place 1 suppository (25 mg total) rectally 2 (two) times daily. 12 suppository 0  . Insulin Detemir (LEVEMIR FLEXPEN) 100 UNIT/ML Pen Inject 32 Units into the skin daily.     Marland Kitchen lovastatin (MEVACOR) 40 MG tablet TAKE 1 TABLET BY MOUTH DAILY AT BEDTIME 30 tablet 5  . Multiple Vitamin (MULTIVITAMIN WITH MINERALS) TABS tablet Take 1 tablet by mouth daily.    Marland Kitchen omeprazole (PRILOSEC) 40 MG capsule Take 40 mg by mouth daily before breakfast.     . Rivaroxaban (XARELTO) 15 MG TABS tablet Take 15 mg by mouth daily with supper.    . Semaglutide,0.25 or 0.5MG /DOS, (OZEMPIC, 0.25 OR 0.5 MG/DOSE,) 2 MG/1.5ML  SOPN Inject 0.5 mg into the skin every Sunday.    . venlafaxine XR (EFFEXOR-XR) 150 MG 24 hr capsule Take 150 mg by mouth daily with breakfast. Take with 75 mg capsule    . venlafaxine XR (EFFEXOR-XR) 75 MG 24 hr capsule Take 75 mg by mouth daily with breakfast. Take with 150 mg capsule     No current facility-administered medications for this visit.     Allergies  Allergen Reactions  . Ultram [Tramadol] Nausea And Vomiting    Social History   Socioeconomic History  . Marital status: Widowed    Spouse name: Not on file  . Number of children: 3  . Years of education: Not on file  . Highest education level: Not on file  Occupational History  . Occupation: Air cabin crew  Social Needs  . Financial resource strain: Not on file  . Food insecurity    Worry: Not on file  Inability: Not on file  . Transportation needs    Medical: Not on file    Non-medical: Not on file  Tobacco Use  . Smoking status: Never Smoker  . Smokeless tobacco: Never Used  Substance and Sexual Activity  . Alcohol use: No  . Drug use: No  . Sexual activity: Never  Lifestyle  . Physical activity    Days per week: Not on file    Minutes per session: Not on file  . Stress: Not on file  Relationships  . Social Herbalist on phone: Not on file    Gets together: Not on file    Attends religious service: Not on file    Active member of club or organization: Not on file    Attends meetings of clubs or organizations: Not on file    Relationship status: Not on file  . Intimate partner violence    Fear of current or ex partner: Not on file    Emotionally abused: Not on file    Physically abused: Not on file    Forced sexual activity: Not on file  Other Topics Concern  . Not on file  Social History Narrative  . Not on file    Family History  Problem Relation Age of Onset  . Cancer Father        prostate  . Hypertension Father   . Heart attack Paternal Grandfather    . Stroke Neg Hx     Review of Systems:  As stated in the HPI and otherwise negative.   BP (!) 124/54   Pulse 65   Ht 5\' 6"  (1.676 m)   Wt 156 lb 6.4 oz (70.9 kg)   SpO2 98%   BMI 25.24 kg/m   Physical Examination:  General: Well developed, well nourished, NAD  HEENT: OP clear, mucus membranes moist  SKIN: warm, dry. No rashes. Neuro: No focal deficits  Musculoskeletal: Muscle strength 5/5 all ext  Psychiatric: Mood and affect normal  Neck: No JVD, no carotid bruits, no thyromegaly, no lymphadenopathy.  Lungs:Clear bilaterally, no wheezes, rhonci, crackles Cardiovascular: Regular rate and rhythm. Loud systolic murmur.  Abdomen:Soft. Bowel sounds present. Non-tender.  Extremities: No lower extremity edema. Pulses are 2 + in the bilateral DP/PT.  Echo August 2020: 1. The left ventricle has hyperdynamic systolic function, with an ejection fraction of >65%. The cavity size was normal. There is moderate asymmetric left ventricular hypertrophy. Left ventricular diastolic Doppler parameters are consistent with  pseudonormalization. Elevated left atrial and left ventricular end-diastolic pressures.  2. The right ventricle has normal systolic function. The cavity was normal. There is no increase in right ventricular wall thickness. Right ventricular systolic pressure could not be assessed.  3. Left atrial size was mildly dilated.  4. S/P MV repair. The mean MV gradient is normal at 66mmHg. There is no MR. MVA by PHT is 2.13cm2.  5. The tricuspid valve is abnormal.  6. S/P TV repair with normal mean MVG of 41mmHg and no TR.  7. The aortic valve is tricuspid. Moderate thickening of the aortic valve. Moderate calcification of the aortic valve with decreased cusp movement. Doppler of the AV and LVOT cannot be acurately assessed. There is significant mid cavitary and LVOT  turbulence with high gradient. Cannot give accurate AV gradients, peak velocity or AVA based on this study due to sampling  issues in the presence of severe BSH, hyperdynamic LVF and AS all contributing to the gradient. There is evidence of  a dagger  shaped velocity within the aortic doppler sample making differentiation impossible. Visually there appears to be moderate AS. Consider TEE if clinically indicated to get a better assessment of AS severity.  8. The aorta is normal unless otherwise noted.  FINDINGS  Left Ventricle: The left ventricle has hyperdynamic systolic function, with an ejection fraction of >65%. The cavity size was normal. There is moderate asymmetric left ventricular hypertrophy. Left ventricular diastolic Doppler parameters are consistent  with pseudonormalization. Elevated left atrial and left ventricular end-diastolic pressures Definity contrast agent was given IV to delineate the left ventricular endocardial borders.  Right Ventricle: The right ventricle has normal systolic function. The cavity was normal. There is no increase in right ventricular wall thickness. Right ventricular systolic pressure could not be assessed.  Left Atrium: Left atrial size was mildly dilated.  Right Atrium: Right atrial size was normal in size.  Interatrial Septum: No atrial level shunt detected by color flow Doppler.  Pericardium: There is no evidence of pericardial effusion.  Mitral Valve: The mitral valve has been repaired/replaced. Mitral valve regurgitation was not assessed by color flow Doppler. S/P MV repair. The mean MV gradient is normal at 89mmHg. There is no MR. MVA by PHT is 2.13cm2.  Tricuspid Valve: The tricuspid valve is abnormal. Tricuspid valve regurgitation was not visualized by color flow Doppler. S/P TV repair with normal mean MVG of 60mmHg and no TR.  Aortic Valve: The aortic valve is tricuspid Moderate thickening of the aortic valve. Moderate calcification of the aortic valve. Aortic valve regurgitation was not visualized by color flow Doppler.  Pulmonic Valve: The pulmonic valve was  normal in structure. Pulmonic valve regurgitation is trivial by color flow Doppler.  Aorta: The aorta is normal unless otherwise noted.  Venous: The inferior vena cava was not well visualized.  Compared to previous exam: 11/16/17 EF 60-65%. MV 63mmHg peak. TV 24mmHg mean. Moderate AS 43mmHg mean, 49mmHg peak.    +--------------+--------++ LEFT VENTRICLE         +----------------+---------++ +--------------+--------++ Diastology                PLAX 2D                +----------------+---------++ +--------------+--------++ LV e' lateral:  8.81 cm/s LVIDd:        3.40 cm  +----------------+---------++ +--------------+--------++ LV E/e' lateral:14.3      LVIDs:        2.30 cm  +----------------+---------++ +--------------+--------++ LV e' medial:   3.56 cm/s LV PW:        1.00 cm  +----------------+---------++ +--------------+--------++ LV E/e' medial: 35.4      LV IVS:       1.30 cm  +----------------+---------++ +--------------+--------++ LVOT diam:    2.00 cm  +--------------+--------++ LV SV:        29 ml    +--------------+--------++ LV SV Index:  15.43    +--------------+--------++ LVOT Area:    3.14 cm +--------------+--------++                        +--------------+--------++  +---------------+---------++ RIGHT VENTRICLE          +---------------+---------++ RV S prime:    6.18 cm/s +---------------+---------++  +---------------+-------++-----------++ LEFT ATRIUM           Index       +---------------+-------++-----------++ LA diam:       4.00 cm2.15 cm/m  +---------------+-------++-----------++ LA Vol (A2C):  67.1 ml36.13 ml/m +---------------+-------++-----------++ LA Vol (A4C):  72.4 ml38.99 ml/m +---------------+-------++-----------++ LA Biplane Vol:72.0 ml38.77 ml/m +---------------+-------++-----------++ +------------+---------++-----------++  RIGHT ATRIUM         Index       +------------+---------++-----------++ RA Area:    13.90 cm            +------------+---------++-----------++ RA Volume:  31.10 ml 16.75 ml/m +------------+---------++-----------++  +------------------+------------++ AORTIC VALVE                   +------------------+------------++ AV Area (Vmax):   0.92 cm     +------------------+------------++ AV Area (Vmean):  1.00 cm     +------------------+------------++ AV Area (VTI):    1.01 cm     +------------------+------------++ AV Vmax:          479.33 cm/s  +------------------+------------++ AV Vmean:         310.800 cm/s +------------------+------------++ AV VTI:           0.940 m      +------------------+------------++ AV Peak Grad:     91.9 mmHg    +------------------+------------++ AV Mean Grad:     51.3 mmHg    +------------------+------------++ LVOT Vmax:        141.00 cm/s  +------------------+------------++ LVOT Vmean:       99.100 cm/s  +------------------+------------++ LVOT VTI:         0.302 m      +------------------+------------++ LVOT/AV VTI ratio:0.32         +------------------+------------++   +-------------+-------++ AORTA                +-------------+-------++ Ao Root diam:3.20 cm +-------------+-------++ Ao Asc diam: 3.30 cm +-------------+-------++  +--------------+-----------++ +---------------+---------++ MITRAL VALVE              TRICUSPID VALVE          +--------------+-----------++ +---------------+---------++ MV Area (PHT):2.13 cm    TV Peak grad:  3.8 mmHg  +--------------+-----------++ +---------------+---------++ MV Peak grad: 5.9 mmHg    TV Mean grad:  2.0 mmHg  +--------------+-----------++ +---------------+---------++ MV Mean grad: 3.0 mmHg    TV Vmax:       0.97 m/s  +--------------+-----------++ +---------------+---------++ MV  Vmax:      1.21 m/s    TV Vmean:      67.1 cm/s +--------------+-----------++ +---------------+---------++ MV Vmean:     86.8 cm/s   TV VTI:        0.34 msec +--------------+-----------++ +---------------+---------++ MV VTI:       0.43 m      +--------------+-----------++ +--------------+-------+ MV PHT:       103.24 msec SHUNTS                +--------------+-----------++ +--------------+-------+ MV Decel Time:356 msec    Systemic VTI: 0.30 m  +--------------+-----------++ +--------------+-------+ +--------------+-----------++ Systemic Diam:2.00 cm MV E velocity:126.00 cm/s +--------------+-------+ +--------------+-----------++ MV A velocity:99.20 cm/s  +--------------+-----------++ MV E/A ratio: 1.27        +--------------+-----------++   EKG:  EKG is ordered today. The ekg ordered today demonstrates Sinus, rate 65 bpm.   Recent Labs: 11/07/2018: ALT 22 11/29/2018: BUN 35; Creatinine, Ser 1.31; Hemoglobin 13.3; Platelets 275; Potassium 4.9; Sodium 138   Lipid Panel No results found for: CHOL, TRIG, HDL, CHOLHDL, VLDL, LDLCALC, LDLDIRECT   Wt Readings from Last 3 Encounters:  12/17/18 156 lb 6.4 oz (70.9 kg)  12/12/18 156 lb (70.8 kg)  11/29/18 157 lb (71.2 kg)     Other studies Reviewed: Additional studies/ records that were reviewed today include: . Review of the above records demonstrates:  Assessment and Plan:   1. Aortic stenosis: She has severe, stage D aortic valve stenosis. I have personally reviewed the echo images. The aortic valve is thickened, calcified with limited leaflet mobility. I think she would benefit from AVR. Given advanced age, she is not a good candidate for conventional AVR by surgical approach. I think she may be a good candidate for TAVR.   STS Risk Score:  Risk of Mortality: 3.391% Renal Failure: 2.893% Permanent Stroke: 2.275% Prolonged Ventilation: 8.179% DSW Infection: 0.085%  Reoperation: 3.125% Morbidity or Mortality: 13.696% Short Length of Stay: 18.253% Long Length of Stay: 7.121%   I have reviewed the natural history of aortic stenosis with the patient and their family members  who are present today. We have discussed the limitations of medical therapy and the poor prognosis associated with symptomatic aortic stenosis. We have reviewed potential treatment options, including palliative medical therapy, conventional surgical aortic valve replacement, and transcatheter aortic valve replacement. We discussed treatment options in the context of the patient's specific comorbid medical conditions.   She would like to proceed with planning for TAVR. I will arrange a right and left heart catheterization at [facility] 12/24/18. Risks and benefits of the cath procedure and the valve procedure reviewed with the patient. After the cath, she will have a cardiac CT, CTA of the chest/abdomen and pelvis, PFTs and will then be referred to see Dr. Roxy Manns who performed her prior mitral and tricuspid surgeries.   2. Mitral regurgitation: She is s/p MV ring in 2014, stable by echo 2020  3. Tricuspid regurgitation: She is s/p TV ring in 2014. Stable by echo in 2020  4. CAD without angina: Mild CAD by cath in 2014. No chest pain. Continue statin. She has not been on an ASA since she is on coumadin.    5. HTN: BP stable off of Lisinopril and Norvasc.   6. Atrial fibrillation, paroxysmal: She is in sinus today. Continue Xarelto.     Current medicines are reviewed at length with the patient today.  The patient does not have concerns regarding medicines.  The following changes have been made:  no change  Labs/ tests ordered today include:   Orders Placed This Encounter  Procedures  . CT CORONARY MORPH W/CTA COR W/SCORE W/CA W/CM &/OR WO/CM  . CT ANGIO CHEST AORTA W &/OR WO CONTRAST  . CT ANGIO ABDOMEN PELVIS  W &/OR WO CONTRAST  . CBC with Differential/Platelet  . Basic metabolic  panel  . EKG 12-Lead  . VAS US CAROTID    Disposition:   FU with me after her TAVR.   Signed, Lauree Chandler, MD 12/17/2018 10:20 AM    [facility] [address], [place], [place]  [address] Phone: [phone]; Fax: [phone]

## 2018-12-17 ENCOUNTER — Encounter: Payer: Self-pay | Admitting: Cardiovascular Disease

## 2018-12-17 ENCOUNTER — Ambulatory Visit (INDEPENDENT_AMBULATORY_CARE_PROVIDER_SITE_OTHER): Payer: Medicare Other | Admitting: Cardiovascular Disease

## 2018-12-17 ENCOUNTER — Other Ambulatory Visit: Payer: Self-pay

## 2018-12-17 VITALS — BP 124/54 | HR 65 | Ht 66.0 in | Wt 156.4 lb

## 2018-12-17 DIAGNOSIS — I350 Nonrheumatic aortic (valve) stenosis: Secondary | ICD-10-CM

## 2018-12-17 LAB — CBC WITH DIFFERENTIAL/PLATELET
Basophils Absolute: 0 10*3/uL (ref 0.0–0.2)
Basos: 0 %
EOS (ABSOLUTE): 0.1 10*3/uL (ref 0.0–0.4)
Eos: 1 %
Hematocrit: 38.8 % (ref 34.0–46.6)
Hemoglobin: 13 g/dL (ref 11.1–15.9)
Immature Grans (Abs): 0 10*3/uL (ref 0.0–0.1)
Immature Granulocytes: 0 %
Lymphocytes Absolute: 1.6 10*3/uL (ref 0.7–3.1)
Lymphs: 23 %
MCH: 29.8 pg (ref 26.6–33.0)
MCHC: 33.5 g/dL (ref 31.5–35.7)
MCV: 89 fL (ref 79–97)
Monocytes Absolute: 0.7 10*3/uL (ref 0.1–0.9)
Monocytes: 10 %
Neutrophils Absolute: 4.8 10*3/uL (ref 1.4–7.0)
Neutrophils: 66 %
Platelets: 231 10*3/uL (ref 150–450)
RBC: 4.36 x10E6/uL (ref 3.77–5.28)
RDW: 12.2 % (ref 11.7–15.4)
WBC: 7.3 10*3/uL (ref 3.4–10.8)

## 2018-12-17 LAB — BASIC METABOLIC PANEL
BUN/Creatinine Ratio: 21 (ref 12–28)
BUN: 23 mg/dL (ref 8–27)
CO2: 23 mmol/L (ref 20–29)
Calcium: 10.3 mg/dL (ref 8.7–10.3)
Chloride: 99 mmol/L (ref 96–106)
Creatinine, Ser: 1.09 mg/dL — ABNORMAL HIGH (ref 0.57–1.00)
GFR calc Af Amer: 56 mL/min/{1.73_m2} — ABNORMAL LOW (ref >59)
GFR calc non Af Amer: 48 mL/min/{1.73_m2} — ABNORMAL LOW (ref >59)
Glucose: 219 mg/dL — ABNORMAL HIGH (ref 65–99)
Potassium: 4.8 mmol/L (ref 3.5–5.2)
Sodium: 137 mmol/L (ref 134–144)

## 2018-12-17 NOTE — Patient Instructions (Addendum)
You are scheduled for a Cardiac Catheterization with Dr. Lauree Chandler on Monday, December 24, 2018.  1. Please arrive at the [facility] (Main Entrance A) at [facility]: [address] [place],  [address] at [place] are currently allowed ONE visitor in the hospital. Both you and your visitor must wear masks. Free valet parking service is available. Special note: Every effort is made to have your procedure done on time. Please understand that emergencies sometimes delay scheduled procedures.  2. Diet: Do not eat solid foods after midnight.  The patient may have clear liquids until 5am upon the day of the procedure.  3. Labs: You will need to have blood drawn TODAY.   You will need a COVID screen on Friday, September 25th at 1:30PM.  [facility]   [address]  Stay in the far right lane. Tell them you are there for pre-procedure testing!  4. Medication instructions in preparation for your procedure:  1) Take your last dose of Xarelto on September 25th.  2) HOLD INSULIN the morning of your procedure  3) HOLD LOSARTAN the morning of your cath  4) Take ASPIRIN 81 mg the morning of your cath  5) You may take your other medications as directed with sips of water  5. Plan for one night stay--bring personal belongings. 6. Bring a current list of your medications and current insurance cards. 7. You MUST have a responsible person to drive you home. 8. Someone MUST be with you the first 24 hours after you arrive home or your discharge will be delayed. 9. Please wear clothes that are easy to get on and off and wear slip-on shoes.  Thank you for allowing [place] to care for you!   -- [facility]

## 2018-12-20 ENCOUNTER — Telehealth: Payer: Self-pay | Admitting: *Deleted

## 2018-12-20 NOTE — Telephone Encounter (Signed)
Pt contacted pre-catheterization scheduled at [facility] for: Monday December 24, 2018 12 noon Verified arrival time and place: [facility]) at: 8 AM -pre procedure hydration   No solid food after midnight prior to cath, clear liquids until 5 AM day of procedure. Contrast allergy: no  Hold: Insulin-AM of procedure. Xarelto-last dose 12/21/18 until post procedure.  Except hold medications AM meds can be  taken pre-cath with sip of water including: ASA 81 mg   Confirmed patient has responsible person to drive home post procedure and observe 24 hours after arriving home: yes  Currently, due to Covid-19 pandemic, only one support person will be allowed with patient. Must be the same support person for that patient's entire stay, will be screened and required to wear a mask. They will be asked to wait in the waiting room for the duration of the patient's stay.  Patients are required to wear a mask when they enter the hospital.      COVID-19 Pre-Screening Questions:  . In the past 7 to 10 days have you had a cough,  shortness of breath, headache, congestion, fever (100 or greater) body aches, chills, sore throat, or sudden loss of taste or sense of smell? Headache/sinus congestion  . Have you been around anyone with known Covid 19? no . Have you been around anyone who is awaiting Covid 19 test results in the past 7 to 10 days? no . Have you been around anyone who has been exposed to Covid 19, or has mentioned symptoms of Covid 19 within the past 7 to 10 days? no  I reviewed procedure/mask/visitor instructions, Covid-19 screening questions with patient, she verbalized understanding, thanked me for call.

## 2018-12-21 ENCOUNTER — Other Ambulatory Visit (HOSPITAL_COMMUNITY)
Admission: RE | Admit: 2018-12-21 | Discharge: 2018-12-21 | Disposition: A | Discharge status: Home / Self Care | Payer: Medicare Other | Source: Ambulatory Visit | Attending: Cardiovascular Disease | Admitting: Cardiovascular Disease

## 2018-12-21 DIAGNOSIS — Z20828 Contact with and (suspected) exposure to other viral communicable diseases: Secondary | ICD-10-CM | POA: Diagnosis not present

## 2018-12-21 DIAGNOSIS — Z01812 Encounter for preprocedural laboratory examination: Secondary | ICD-10-CM | POA: Diagnosis not present

## 2018-12-22 LAB — NOVEL CORONAVIRUS, NAA (HOSP ORDER, SEND-OUT TO REF LAB; TAT 18-24 HRS): SARS-CoV-2, NAA: NOT DETECTED

## 2018-12-24 ENCOUNTER — Other Ambulatory Visit: Payer: Self-pay

## 2018-12-24 ENCOUNTER — Encounter (HOSPITAL_COMMUNITY)
Admission: RE | Disposition: A | Discharge status: Home / Self Care | Payer: Self-pay | Source: Home / Self Care | Attending: Cardiovascular Disease

## 2018-12-24 ENCOUNTER — Ambulatory Visit (HOSPITAL_COMMUNITY)
Admission: RE | Admit: 2018-12-24 | Discharge: 2018-12-24 | Disposition: A | Discharge status: Home / Self Care | Payer: Medicare Other | Attending: Cardiovascular Disease | Admitting: Cardiovascular Disease

## 2018-12-24 DIAGNOSIS — Z7901 Long term (current) use of anticoagulants: Secondary | ICD-10-CM | POA: Diagnosis not present

## 2018-12-24 DIAGNOSIS — I480 Paroxysmal atrial fibrillation: Secondary | ICD-10-CM | POA: Diagnosis not present

## 2018-12-24 DIAGNOSIS — M170 Bilateral primary osteoarthritis of knee: Secondary | ICD-10-CM | POA: Diagnosis not present

## 2018-12-24 DIAGNOSIS — E1136 Type 2 diabetes mellitus with diabetic cataract: Secondary | ICD-10-CM | POA: Diagnosis not present

## 2018-12-24 DIAGNOSIS — Z794 Long term (current) use of insulin: Secondary | ICD-10-CM | POA: Insufficient documentation

## 2018-12-24 DIAGNOSIS — I350 Nonrheumatic aortic (valve) stenosis: Secondary | ICD-10-CM | POA: Diagnosis not present

## 2018-12-24 DIAGNOSIS — Z952 Presence of prosthetic heart valve: Secondary | ICD-10-CM | POA: Insufficient documentation

## 2018-12-24 DIAGNOSIS — Z885 Allergy status to narcotic agent status: Secondary | ICD-10-CM | POA: Insufficient documentation

## 2018-12-24 DIAGNOSIS — Z8673 Personal history of transient ischemic attack (TIA), and cerebral infarction without residual deficits: Secondary | ICD-10-CM | POA: Insufficient documentation

## 2018-12-24 DIAGNOSIS — Z9012 Acquired absence of left breast and nipple: Secondary | ICD-10-CM | POA: Diagnosis not present

## 2018-12-24 DIAGNOSIS — K219 Gastro-esophageal reflux disease without esophagitis: Secondary | ICD-10-CM | POA: Insufficient documentation

## 2018-12-24 DIAGNOSIS — I2510 Atherosclerotic heart disease of native coronary artery without angina pectoris: Secondary | ICD-10-CM | POA: Diagnosis not present

## 2018-12-24 DIAGNOSIS — Z8249 Family history of ischemic heart disease and other diseases of the circulatory system: Secondary | ICD-10-CM | POA: Diagnosis not present

## 2018-12-24 DIAGNOSIS — I10 Essential (primary) hypertension: Secondary | ICD-10-CM | POA: Insufficient documentation

## 2018-12-24 DIAGNOSIS — Z79899 Other long term (current) drug therapy: Secondary | ICD-10-CM | POA: Diagnosis not present

## 2018-12-24 DIAGNOSIS — Z9851 Tubal ligation status: Secondary | ICD-10-CM | POA: Diagnosis not present

## 2018-12-24 HISTORY — PX: RIGHT HEART CATH AND CORONARY ANGIOGRAPHY: CATH118264

## 2018-12-24 LAB — GLUCOSE, CAPILLARY: Glucose-Capillary: 188 mg/dL — ABNORMAL HIGH (ref 70–99)

## 2018-12-24 SURGERY — RIGHT HEART CATH AND CORONARY ANGIOGRAPHY
Anesthesia: LOCAL

## 2018-12-24 MED ORDER — HEPARIN SODIUM (PORCINE) 1000 UNIT/ML IJ SOLN
INTRAMUSCULAR | Status: AC
  Filled 2018-12-24: qty 1

## 2018-12-24 MED ORDER — FENTANYL CITRATE (PF) 100 MCG/2ML IJ SOLN
INTRAMUSCULAR | Status: DC | PRN
  Administered 2018-12-24: 25 ug via INTRAVENOUS

## 2018-12-24 MED ORDER — MIDAZOLAM HCL 2 MG/2ML IJ SOLN
INTRAMUSCULAR | Status: DC | PRN
  Administered 2018-12-24: 1 mg via INTRAVENOUS

## 2018-12-24 MED ORDER — MIDAZOLAM HCL 2 MG/2ML IJ SOLN
INTRAMUSCULAR | Status: AC
  Filled 2018-12-24: qty 2

## 2018-12-24 MED ORDER — IOHEXOL 350 MG/ML SOLN
INTRAVENOUS | Status: DC | PRN
  Administered 2018-12-24: 15:00:00 80 mL via INTRA_ARTERIAL

## 2018-12-24 MED ORDER — HEPARIN (PORCINE) IN NACL 1000-0.9 UT/500ML-% IV SOLN
INTRAVENOUS | Status: AC
  Filled 2018-12-24: qty 1000

## 2018-12-24 MED ORDER — LIDOCAINE HCL (PF) 1 % IJ SOLN
INTRAMUSCULAR | Status: DC | PRN
  Administered 2018-12-24: 5 mL

## 2018-12-24 MED ORDER — LIDOCAINE HCL (PF) 1 % IJ SOLN
INTRAMUSCULAR | Status: AC
  Filled 2018-12-24: qty 30

## 2018-12-24 MED ORDER — VERAPAMIL HCL 2.5 MG/ML IV SOLN
INTRAVENOUS | Status: AC
  Filled 2018-12-24: qty 2

## 2018-12-24 MED ORDER — SODIUM CHLORIDE 0.9 % WEIGHT BASED INFUSION
3.0000 mL/kg/h | INTRAVENOUS | Status: AC
  Administered 2018-12-24: 3 mL/kg/h via INTRAVENOUS

## 2018-12-24 MED ORDER — HYDRALAZINE HCL 20 MG/ML IJ SOLN: 10.0000 mg | INTRAMUSCULAR | Status: DC | PRN

## 2018-12-24 MED ORDER — SODIUM CHLORIDE 0.9 % IV SOLN: 250.0000 mL | INTRAVENOUS | Status: DC | PRN

## 2018-12-24 MED ORDER — SODIUM CHLORIDE 0.9% FLUSH: 3.0000 mL | INTRAVENOUS | Status: DC | PRN

## 2018-12-24 MED ORDER — VERAPAMIL HCL 2.5 MG/ML IV SOLN
INTRAVENOUS | Status: DC | PRN
  Administered 2018-12-24: 14:00:00 10 mL via INTRA_ARTERIAL

## 2018-12-24 MED ORDER — SODIUM CHLORIDE 0.9 % IV SOLN: INTRAVENOUS | Status: AC

## 2018-12-24 MED ORDER — FENTANYL CITRATE (PF) 100 MCG/2ML IJ SOLN
INTRAMUSCULAR | Status: AC
  Filled 2018-12-24: qty 2

## 2018-12-24 MED ORDER — LABETALOL HCL 5 MG/ML IV SOLN: 10.0000 mg | INTRAVENOUS | Status: DC | PRN

## 2018-12-24 MED ORDER — ASPIRIN 81 MG PO CHEW: 81.0000 mg | CHEWABLE_TABLET | ORAL | Status: DC

## 2018-12-24 MED ORDER — ONDANSETRON HCL 4 MG/2ML IJ SOLN: 4.0000 mg | Freq: Four times a day (QID) | INTRAMUSCULAR | Status: DC | PRN

## 2018-12-24 MED ORDER — SODIUM CHLORIDE 0.9% FLUSH: 3.0000 mL | Freq: Two times a day (BID) | INTRAVENOUS | Status: DC

## 2018-12-24 MED ORDER — SODIUM CHLORIDE 0.9 % WEIGHT BASED INFUSION: 1.0000 mL/kg/h | INTRAVENOUS | Status: DC

## 2018-12-24 MED ORDER — HEPARIN (PORCINE) IN NACL 1000-0.9 UT/500ML-% IV SOLN
INTRAVENOUS | Status: DC | PRN
  Administered 2018-12-24 (×2): 500 mL

## 2018-12-24 MED ORDER — ACETAMINOPHEN 325 MG PO TABS: 650.0000 mg | ORAL_TABLET | ORAL | Status: DC | PRN

## 2018-12-24 MED ORDER — HEPARIN SODIUM (PORCINE) 1000 UNIT/ML IJ SOLN
INTRAMUSCULAR | Status: DC | PRN
  Administered 2018-12-24: 3500 [IU] via INTRAVENOUS

## 2018-12-24 SURGICAL SUPPLY — 15 items
CATH 5FR JL3.5 JR4 ANG PIG MP (CATHETERS) ×1 IMPLANT
CATH BALLN WEDGE 5F 110CM (CATHETERS) ×1 IMPLANT
CATH INFINITI 5 FR AR1 MOD (CATHETERS) ×1 IMPLANT
CATH INFINITI 5FR AL1 (CATHETERS) ×1 IMPLANT
DEVICE RAD COMP TR BAND LRG (VASCULAR PRODUCTS) ×1 IMPLANT
GLIDESHEATH SLEND SS 6F .021 (SHEATH) ×1 IMPLANT
GUIDEWIRE .025 260CM (WIRE) ×1 IMPLANT
GUIDEWIRE INQWIRE 1.5J.035X260 (WIRE) IMPLANT
INQWIRE 1.5J .035X260CM (WIRE) ×2
KIT HEART LEFT (KITS) ×2 IMPLANT
PACK CARDIAC CATHETERIZATION (CUSTOM PROCEDURE TRAY) ×2 IMPLANT
SHEATH GLIDE SLENDER 4/5FR (SHEATH) ×1 IMPLANT
TRANSDUCER W/STOPCOCK (MISCELLANEOUS) ×2 IMPLANT
TUBING CIL FLEX 10 FLL-RA (TUBING) ×2 IMPLANT
WIRE EMERALD ST .035X150CM (WIRE) ×1 IMPLANT

## 2018-12-24 NOTE — Interval H&P Note (Signed)
History and Physical Interval Note:  12/24/2018 11:40 AM  Rachel Santos  has presented today for surgery, with the diagnosis of arotic stenosis.  The various methods of treatment have been discussed with the patient and family. After consideration of risks, benefits and other options for treatment, the patient has consented to  Procedure(s): RIGHT/LEFT HEART CATH AND CORONARY ANGIOGRAPHY (N/A) as a surgical intervention.  The patient's history has been reviewed, patient examined, no change in status, stable for surgery.  I have reviewed the patient's chart and labs.  Questions were answered to the patient's satisfaction.    Cath Lab Visit (complete for each Cath Lab visit)  Clinical Evaluation Leading to the Procedure:   ACS: No.  Non-ACS:    Anginal Classification: CCS II  Anti-ischemic medical therapy: No Therapy  Non-Invasive Test Results: No non-invasive testing performed  Prior CABG: No previous CABG         Lauree Chandler

## 2018-12-24 NOTE — Discharge Instructions (Signed)
Resume Xarelto tomorrow, (12/25/18).    Radial Site Care  This sheet gives you information about how to care for yourself after your procedure. Your health care provider may also give you more specific instructions. If you have problems or questions, contact your health care provider. What can I expect after the procedure? After the procedure, it is common to have:  Bruising and tenderness at the catheter insertion area. Follow these instructions at home: Medicines  Take over-the-counter and prescription medicines only as told by your health care provider. Insertion site care  Follow instructions from your health care provider about how to take care of your insertion site. Make sure you: ? Wash your hands with soap and water before you change your bandage (dressing). If soap and water are not available, use hand sanitizer. ? Change your dressing as told by your health care provider. ? Leave stitches (sutures), skin glue, or adhesive strips in place. These skin closures may need to stay in place for 2 weeks or longer. If adhesive strip edges start to loosen and curl up, you may trim the loose edges. Do not remove adhesive strips completely unless your health care provider tells you to do that.  Check your insertion site every day for signs of infection. Check for: ? Redness, swelling, or pain. ? Fluid or blood. ? Pus or a bad smell. ? Warmth.  Do not take baths, swim, or use a hot tub until your health care provider approves.  You may shower 24-48 hours after the procedure, or as directed by your health care provider. ? Remove the dressing and gently wash the site with plain soap and water. ? Pat the area dry with a clean towel. ? Do not rub the site. That could cause bleeding.  Do not apply powder or lotion to the site. Activity   For 24 hours after the procedure, or as directed by your health care provider: ? Do not flex or bend the affected arm. ? Do not push or pull heavy  objects with the affected arm. ? Do not drive yourself home from the hospital or clinic. You may drive 24 hours after the procedure unless your health care provider tells you not to. ? Do not operate machinery or power tools.  Do not lift anything that is heavier than 10 lb (4.5 kg), or the limit that you are told, until your health care provider says that it is safe.  Ask your health care provider when it is okay to: ? Return to work or school. ? Resume usual physical activities or sports. ? Resume sexual activity. General instructions  If the catheter site starts to bleed, raise your arm and put firm pressure on the site. If the bleeding does not stop, get help right away. This is a medical emergency.  If you went home on the same day as your procedure, a responsible adult should be with you for the first 24 hours after you arrive home.  Keep all follow-up visits as told by your health care provider. This is important. Contact a health care provider if:  You have a fever.  You have redness, swelling, or yellow drainage around your insertion site. Get help right away if:  You have unusual pain at the radial site.  The catheter insertion area swells very fast.  The insertion area is bleeding, and the bleeding does not stop when you hold steady pressure on the area.  Your arm or hand becomes pale, cool, tingly, or numb.  These symptoms may represent a serious problem that is an emergency. Do not wait to see if the symptoms will go away. Get medical help right away. Call your local emergency services (911 in the [place] Do not drive yourself to the hospital. Summary  After the procedure, it is common to have bruising and tenderness at the site.  Follow instructions from your health care provider about how to take care of your radial site wound. Check the wound every day for signs of infection.  Do not lift anything that is heavier than 10 lb (4.5 kg), or the limit that you are told,  until your health care provider says that it is safe. This information is not intended to replace advice given to you by your health care provider. Make sure you discuss any questions you have with your health care provider. Document Released: 04/16/2010 Document Revised: 04/19/2017 Document Reviewed: 04/19/2017 [employer]vier Patient Education  2020 [employer].

## 2018-12-25 ENCOUNTER — Encounter (HOSPITAL_COMMUNITY): Payer: Self-pay | Admitting: Cardiovascular Disease

## 2018-12-25 LAB — POCT I-STAT EG7
Acid-base deficit: 1 mmol/L (ref 0.0–2.0)
Bicarbonate: 25.6 mmol/L (ref 20.0–28.0)
Calcium, Ion: 1.3 mmol/L (ref 1.15–1.40)
HCT: 34 % — ABNORMAL LOW (ref 36.0–46.0)
Hemoglobin: 11.6 g/dL — ABNORMAL LOW (ref 12.0–15.0)
O2 Saturation: 76 %
Potassium: 4.2 mmol/L (ref 3.5–5.1)
Sodium: 140 mmol/L (ref 135–145)
TCO2: 27 mmol/L (ref 22–32)
pCO2, Ven: 47.2 mmHg (ref 44.0–60.0)
pH, Ven: 7.342 (ref 7.250–7.430)
pO2, Ven: 44 mmHg (ref 32.0–45.0)

## 2018-12-25 LAB — POCT I-STAT 7, (LYTES, BLD GAS, ICA,H+H)
Acid-base deficit: 2 mmol/L (ref 0.0–2.0)
Bicarbonate: 23.9 mmol/L (ref 20.0–28.0)
Calcium, Ion: 1.19 mmol/L (ref 1.15–1.40)
HCT: 31 % — ABNORMAL LOW (ref 36.0–46.0)
Hemoglobin: 10.5 g/dL — ABNORMAL LOW (ref 12.0–15.0)
O2 Saturation: 97 %
Potassium: 4 mmol/L (ref 3.5–5.1)
Sodium: 142 mmol/L (ref 135–145)
TCO2: 25 mmol/L (ref 22–32)
pCO2 arterial: 43.2 mmHg (ref 32.0–48.0)
pH, Arterial: 7.352 (ref 7.350–7.450)
pO2, Arterial: 100 mmHg (ref 83.0–108.0)

## 2018-12-26 ENCOUNTER — Encounter: Payer: Self-pay | Admitting: Thoracic Surgery (Cardiothoracic Vascular Surgery)

## 2019-01-02 ENCOUNTER — Ambulatory Visit (HOSPITAL_COMMUNITY)
Admission: RE | Admit: 2019-01-02 | Discharge: 2019-01-02 | Disposition: A | Discharge status: Home / Self Care | Payer: Medicare Other | Source: Ambulatory Visit | Attending: Cardiovascular Disease | Admitting: Cardiovascular Disease

## 2019-01-02 ENCOUNTER — Other Ambulatory Visit: Payer: Self-pay

## 2019-01-02 ENCOUNTER — Ambulatory Visit (HOSPITAL_BASED_OUTPATIENT_CLINIC_OR_DEPARTMENT_OTHER)
Admission: RE | Admit: 2019-01-02 | Discharge: 2019-01-02 | Disposition: A | Discharge status: Home / Self Care | Payer: Medicare Other | Source: Ambulatory Visit | Attending: Cardiovascular Disease | Admitting: Cardiovascular Disease

## 2019-01-02 ENCOUNTER — Ambulatory Visit (HOSPITAL_COMMUNITY): Payer: Medicare Other

## 2019-01-02 DIAGNOSIS — I350 Nonrheumatic aortic (valve) stenosis: Secondary | ICD-10-CM | POA: Insufficient documentation

## 2019-01-02 DIAGNOSIS — I700 Atherosclerosis of aorta: Secondary | ICD-10-CM | POA: Diagnosis not present

## 2019-01-02 MED ORDER — IOHEXOL 350 MG/ML SOLN
100.0000 mL | Freq: Once | INTRAVENOUS | Status: AC | PRN
  Administered 2019-01-02: 100 mL via INTRAVENOUS

## 2019-01-03 ENCOUNTER — Other Ambulatory Visit (HOSPITAL_COMMUNITY): Payer: Medicare Other

## 2019-01-03 ENCOUNTER — Ambulatory Visit (HOSPITAL_COMMUNITY): Payer: Medicare Other

## 2019-01-04 ENCOUNTER — Telehealth: Payer: Self-pay | Admitting: Cardiovascular Disease

## 2019-01-04 NOTE — Telephone Encounter (Signed)
New Message  Patient is calling in for her results. Please give patient a call back.

## 2019-01-07 ENCOUNTER — Ambulatory Visit: Payer: Medicare Other | Attending: Cardiovascular Disease | Admitting: Physical Therapy

## 2019-01-07 ENCOUNTER — Other Ambulatory Visit: Payer: Self-pay

## 2019-01-07 ENCOUNTER — Encounter: Payer: Self-pay | Admitting: Thoracic Surgery (Cardiothoracic Vascular Surgery)

## 2019-01-07 ENCOUNTER — Institutional Professional Consult (permissible substitution): Payer: Medicare Other | Admitting: Thoracic Surgery (Cardiothoracic Vascular Surgery)

## 2019-01-07 ENCOUNTER — Encounter: Payer: Self-pay | Admitting: Physical Therapy

## 2019-01-07 VITALS — BP 147/75 | HR 72 | Temp 97.7°F | Resp 20 | Ht 66.0 in | Wt 152.6 lb

## 2019-01-07 DIAGNOSIS — Z9889 Other specified postprocedural states: Secondary | ICD-10-CM

## 2019-01-07 DIAGNOSIS — Z8679 Personal history of other diseases of the circulatory system: Secondary | ICD-10-CM

## 2019-01-07 DIAGNOSIS — R2689 Other abnormalities of gait and mobility: Secondary | ICD-10-CM | POA: Diagnosis not present

## 2019-01-07 DIAGNOSIS — I350 Nonrheumatic aortic (valve) stenosis: Secondary | ICD-10-CM | POA: Diagnosis not present

## 2019-01-07 NOTE — H&P (View-Only) (Signed)
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  Patient's cardiac history dates back to 2014 when she presented with TIA in the setting of newly diagnosed paroxysmal atrial fibrillation.  She was found to have severe mitral regurgitation and moderate tricuspid regurgitation and underwent mitral valve repair, tricuspid valve repair, and Maze procedure on October 11, 2012.  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  She also underwent tricuspid ring annuloplasty and maze procedure at that time.  Intraoperative findings were also notable for the presence of aortic valve sclerosis without aortic stenosis on transesophageal echocardiogram.  Her postoperative convalescence was notable only for some transient bradycardia and she was evaluated by Dr. Caryl Comes who did not feel that permanent pacemaker implantation was necessary.  She otherwise did well and was last seen here in our office on December 09, 2013 at which time she was doing well and maintaining sinus rhythm.  She has been followed  intermittently ever since by Dr. Angelena Form and clinically has done well until recently.  On November 07, 2018 the patient was evaluated in the emergency department following a syncopal event.  She subsequently underwent follow-up transthoracic echocardiogram which revealed normal left ventricular systolic function with intact mitral valve repair and tricuspid valve repair but at least moderate if not severe aortic stenosis, although transvalvular gradients were felt to be somewhat difficult to assess.  She was seen in follow-up by Dr. Angelena Form November 29, 2018 and transesophageal echocardiogram was planned to further evaluate the patient's aortic valve disease.  However, TEE was ultimately canceled after the patient presented for TEE and evaluated by Dr. Debara Pickett who felt the patient clearly had severe aortic stenosis I did not feel that TEE was indicated.  The patient subsequently underwent diagnostic cardiac catheterization by Dr. Angelena Form on December 24, 2018.  Attempts across aortic valve by catheterization were unsuccessful.  The patient was noted to have mild nonobstructive coronary disease in the left anterior descending and left circumflex territories with moderate nonobstructive disease in the right coronary artery territory.  Right heart pressures were mild to moderately elevated.  CT angiography was performed and the patient referred for surgical consultation.  Patient is widowed and lives alone locally in [place].  She previously worked for [employer].  She has 3 adult daughters, 1 of whom accompanies her for her office consultation visit today.  Another daughter is a Marine scientist who works at [employer].  Patient has remained functionally independent until recently.  However, she describes a gradual progression of symptoms of exertional shortness of breath and decreased energy for the past year or so.  She now gets short of breath with low-level activity.  She has not had chest pain or  chest tightness either with activity  or at rest.  She denies resting shortness of breath, PND, orthopnea, or lower extremity edema.  She has had several dizzy spells and one frank syncopal event that occurred this past August.  At the time she was standing in her shower and passed out and fell without any warning.  She has not had palpitations.  Her syncopal event and dizzy spells do not seem to be related to exertion but can be somewhat positional.  Past Medical History:  Diagnosis Date   Arthritis    knees   Atrial fibrillation (HCC)    Cataracts, bilateral    Diabetes mellitus    GERD (gastroesophageal reflux disease)    Hypertension    Mitral regurgitation    PONV (postoperative nausea and vomiting)    S/P Maze operation for atrial fibrillation 10/11/2012   Complete bilateral atrial lesion set using bipolar radiofrequency and cryothermy ablation with clipping of LA appendage   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   33mm Sorin Memo 3D ring annuloplasty   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   39mm Sorin Memo 3D ring annuloplasty with 26 mm Edwards mc3 tricuspid ring annuloplasty    Shortness of breath    with activity   Stroke ([place])    mini stroke with vision problems   Tricuspid regurgitation     Past Surgical History:  Procedure Laterality Date   CARDIAC CATHETERIZATION  09-05-12   CARDIOVERSION N/A 07/31/2012   Procedure: CARDIOVERSION;  Surgeon: Josue Hector, MD;  Location: [facility];  Service: Cardiovascular;  Laterality: N/A;   CESAREAN SECTION     x 2   COLONOSCOPY WITH PROPOFOL N/A 11/28/2014   Procedure: COLONOSCOPY WITH PROPOFOL;  Surgeon: Carol Ada, MD;  Location: [facility];  Service: Endoscopy;  Laterality: N/A;   FLEXIBLE SIGMOIDOSCOPY N/A 12/18/2015   Procedure: FLEXIBLE SIGMOIDOSCOPY;  Surgeon: Carol Ada, MD;  Location: [facility];  Service: Endoscopy;  Laterality: N/A;   INTRAOPERATIVE TRANSESOPHAGEAL ECHOCARDIOGRAM N/A 10/11/2012    Procedure: INTRAOPERATIVE TRANSESOPHAGEAL ECHOCARDIOGRAM;  Surgeon: Rexene Alberts, MD;  Location: [facility];  Service: Open Heart Surgery;  Laterality: N/A;   MASTECTOMY Left 1982   MAZE N/A 10/11/2012   Procedure: MAZE;  Surgeon: Rexene Alberts, MD;  Location: [facility];  Service: Open Heart Surgery;  Laterality: N/A;   MITRAL VALVE REPAIR N/A 10/11/2012   Procedure: MITRAL VALVE REPAIR (MVR);  Surgeon: Rexene Alberts, MD;  Location: [facility];  Service: Open Heart Surgery;  Laterality: N/A;   RIGHT HEART CATH AND CORONARY ANGIOGRAPHY N/A 12/24/2018   Procedure: RIGHT HEART CATH AND CORONARY ANGIOGRAPHY;  Surgeon: [name], MD;  Location: [facility];  Service: Cardiovascular;  Laterality: N/A;   TEE WITHOUT CARDIOVERSION N/A 07/31/2012   Procedure: TRANSESOPHAGEAL ECHOCARDIOGRAM (TEE);  Surgeon: Josue Hector, MD;  Location: [facility] END[facility]COPY;  Service: Cardiovascular;  Laterality: N/A;   TRICUSPID VALVE REPLACEMENT N/A 10/11/2012   Procedure: TRICUSPID VALVE REPAIR;  Surgeon: Rexene Alberts, MD;  Location: [facility];  Service: Open Heart Surgery;  Laterality: N/A;   TUBAL LIGATION      Family History  Problem Relation Age of Onset   Cancer Father        prostate   Hypertension Father    Heart attack Paternal Grandfather    Stroke Neg Hx     Social History   Socioeconomic History   Marital status: Widowed    Spouse name: Not on file   Number of children: 3   Years  of education: Not on file   Highest education level: Not on file  Occupational History   Occupation: Retired-[employer] [place] resource strain: Not on file   Food insecurity    Worry: Not on file    Inability: Not on file   Transportation needs    Medical: Not on file    Non-medical: Not on file  Tobacco Use   Smoking status: Never Smoker   Smokeless tobacco: Never Used  Substance and Sexual Activity   Alcohol use: No   Drug use: No   Sexual  activity: Never  Lifestyle   Physical activity    Days per week: Not on file    Minutes per session: Not on file   Stress: Not on file  Relationships   Social connections    Talks on phone: Not on file    Gets together: Not on file    Attends religious service: Not on file    Active member of club or organization: Not on file    Attends meetings of clubs or organizations: Not on file    Relationship status: Not on file   Intimate partner violence    Fear of current or ex partner: Not on file    Emotionally abused: Not on file    Physically abused: Not on file    Forced sexual activity: Not on file  Other Topics Concern   Not on file  Social History Narrative   Not on file    Current Outpatient Medications  Medication Sig Dispense Refill   acetaminophen (TYLENOL) 325 MG tablet Take 650 mg by mouth every 6 (six) hours as needed for moderate pain or headache.     busPIRone (BUSPAR) 10 MG tablet Take 10 mg by mouth 2 (two) times daily.      Calcium Carb-Cholecalciferol (CALCIUM 600+D3 PO) Take 1 tablet by mouth 2 (two) times daily.     Insulin Detemir (LEVEMIR FLEXPEN) 100 UNIT/ML Pen Inject 32 Units into the skin daily.      lovastatin (MEVACOR) 40 MG tablet TAKE 1 TABLET BY MOUTH DAILY AT BEDTIME 30 tablet 5   Multiple Vitamin (MULTIVITAMIN WITH MINERALS) TABS tablet Take 1 tablet by mouth daily.     omeprazole (PRILOSEC) 40 MG capsule Take 40 mg by mouth daily before breakfast.      Rivaroxaban (XARELTO) 15 MG TABS tablet Take 15 mg by mouth daily with supper.     Semaglutide,0.25 or 0.5MG /DOS, (OZEMPIC, 0.25 OR 0.5 MG/DOSE,) 2 MG/1.5ML SOPN Inject 0.5 mg into the skin every Sunday.     venlafaxine XR (EFFEXOR-XR) 150 MG 24 hr capsule Take 150 mg by mouth daily with breakfast. Take with 75 mg capsule     venlafaxine XR (EFFEXOR-XR) 75 MG 24 hr capsule Take 75 mg by mouth daily with breakfast. Take with 150 mg capsule     diclofenac sodium (VOLTAREN) 1 % GEL  Apply 2 g topically 4 (four) times daily. (Patient not taking: Reported on 01/07/2019) 100 g 1   hydrocortisone (ANUSOL-HC) 25 MG suppository Place 1 suppository (25 mg total) rectally 2 (two) times daily. (Patient not taking: Reported on 01/07/2019) 12 suppository 0   No current facility-administered medications for this visit.     Allergies  Allergen Reactions   Ultram [Tramadol] Nausea And Vomiting   Nickel Rash    Pt unable to wear jewelry made of nickel.      Review of Systems:   General:  Decreased  appetite, decreased energy, no weight gain, + weight loss, no fever  Cardiac:  no chest pain with exertion, no chest pain at rest, +SOB with exertion, no resting SOB, no PND, no orthopnea, no palpitations, no arrhythmia, no atrial fibrillation, no LE edema, + dizzy spells, + syncope  Respiratory:  + shortness of breath, no home oxygen, + productive cough, + dry cough, no bronchitis, no wheezing, no hemoptysis, no asthma, no pain with inspiration or cough, no sleep apnea, no CPAP at night  GI:   no difficulty swallowing, + reflux, no frequent heartburn, no hiatal hernia, no abdominal pain, no constipation, no diarrhea, no hematochezia, no hematemesis, no melena  GU:   no dysuria,  no frequency, no urinary tract infection, no hematuria, no kidney stones, + kidney disease  Vascular:  no pain suggestive of claudication, no pain in feet, no leg cramps, + varicose veins, no DVT, no non-healing foot ulcer  Neuro:   no stroke, + TIA in 2014, no seizures, no headaches, no recent episodes temporary blindness one eye,  no slurred speech, + peripheral neuropathy, + chronic pain, + instability of gait, no memory/cognitive dysfunction  Musculoskeletal: + arthritis, + joint swelling, no myalgias, some difficulty walking, decreased mobility   Skin:   no rash, no itching, no skin infections, no pressure sores or ulcerations  Psych:   + anxiety, + depression, + nervousness, no unusual recent  stress  Eyes:   no blurry vision, no floaters, no recent vision changes, does not wear glasses or contacts  ENT:   no hearing loss, no loose or painful teeth, does not recall most recent visit to dentist  Hematologic:  + easy bruising, no abnormal bleeding, no clotting disorder, no frequent epistaxis  Endocrine:  + diabetes, does check CBG's at home           Physical Exam:   BP (!) 147/75 (BP Location: Right Arm)    Pulse 72    Temp 97.7 F (36.5 C) (Skin)    Resp 20    Ht 5\' 6"  (1.676 m)    Wt 152 lb 9.6 oz (69.2 kg)    SpO2 97% Comment: RA   BMI 24.63 kg/m   General:  Elderly,  well-appearing  HEENT:  Unremarkable   Neck:   no JVD, no bruits, no adenopathy   Chest:   clear to auscultation, symmetrical breath sounds, no wheezes, no rhonchi   CV:   RRR, grade III/VI crescendo/decrescendo murmur heard best at RSB,  no diastolic murmur  Abdomen:  soft, non-tender, no masses   Extremities:  warm, well-perfused, pulses palpable, no LE edema  Rectal/GU  Deferred  Neuro:   Grossly non-focal and symmetrical throughout  Skin:   Clean and dry, no rashes, no breakdown   Diagnostic Tests:  EKG: NSR w/out significant AV conduction delay    ECHOCARDIOGRAM REPORT       Patient Name:   Rachel Santos Date of Exam: 11/23/2018 Medical Rec #:  [id number]         Height:       66.0 in Accession #:    [id number]        Weight:       168.0 lb Date of Birth:  1940-06-03        BSA:          1.86 m Patient Age:    57 years          BP:  147/53 mmHg Patient Gender: F                 HR:           70 bpm. Exam Location:  [address]    Procedure: 2D Echo, Cardiac Doppler, Color Doppler and Intracardiac            Opacification Agent  Indications:    I35 Aortic stenosis.   History:        Patient has prior history of Echocardiogram examinations, most                 recent 11/16/2017. Stroke Atrial Fibrillation Aortic Valve                 Disease Signs/Symptoms:  Shortness of Breath Risk Factors:                 Hypertension and Diabetes. CKD stage 2. MV repair, TV repair and                 Maze procedure (2017).   Sonographer:    Jessee Avers, RDCS Referring Phys: [place]    1. The left ventricle has hyperdynamic systolic function, with an ejection fraction of >65%. The cavity size was normal. There is moderate asymmetric left ventricular hypertrophy. Left ventricular diastolic Doppler parameters are consistent with  pseudonormalization. Elevated left atrial and left ventricular end-diastolic pressures.  2. The right ventricle has normal systolic function. The cavity was normal. There is no increase in right ventricular wall thickness. Right ventricular systolic pressure could not be assessed.  3. Left atrial size was mildly dilated.  4. S/P MV repair. The mean MV gradient is normal at 75mmHg. There is no MR. MVA by PHT is 2.13cm2.  5. The tricuspid valve is abnormal.  6. S/P TV repair with normal mean MVG of 44mmHg and no TR.  7. The aortic valve is tricuspid. Moderate thickening of the aortic valve. Moderate calcification of the aortic valve with decreased cusp movement. Doppler of the AV and LVOT cannot be acurately assessed. There is significant mid cavitary and LVOT  turbulence with high gradient. Cannot give accurate AV gradients, peak velocity or AVA based on this study due to sampling issues in the presence of severe BSH, hyperdynamic LVF and AS all contributing to the gradient. There is evidence of a dagger  shaped velocity within the aortic doppler sample making differentiation impossible. Visually there appears to be moderate AS. Consider TEE if clinically indicated to get a better assessment of AS severity.  8. The aorta is normal unless otherwise noted.  FINDINGS  Left Ventricle: The left ventricle has hyperdynamic systolic function, with an ejection fraction of >65%. The cavity size was normal. There is  moderate asymmetric left ventricular hypertrophy. Left ventricular diastolic Doppler parameters are consistent  with pseudonormalization. Elevated left atrial and left ventricular end-diastolic pressures Definity contrast agent was given IV to delineate the left ventricular endocardial borders.  Right Ventricle: The right ventricle has normal systolic function. The cavity was normal. There is no increase in right ventricular wall thickness. Right ventricular systolic pressure could not be assessed.  Left Atrium: Left atrial size was mildly dilated.  Right Atrium: Right atrial size was normal in size.  Interatrial Septum: No atrial level shunt detected by color flow Doppler.  Pericardium: There is no evidence of pericardial effusion.  Mitral Valve: The mitral valve has been repaired/replaced. Mitral valve regurgitation was not assessed  by color flow Doppler. S/P MV repair. The mean MV gradient is normal at 69mmHg. There is no MR. MVA by PHT is 2.13cm2.  Tricuspid Valve: The tricuspid valve is abnormal. Tricuspid valve regurgitation was not visualized by color flow Doppler. S/P TV repair with normal mean MVG of 63mmHg and no TR.  Aortic Valve: The aortic valve is tricuspid Moderate thickening of the aortic valve. Moderate calcification of the aortic valve. Aortic valve regurgitation was not visualized by color flow Doppler.  Pulmonic Valve: The pulmonic valve was normal in structure. Pulmonic valve regurgitation is trivial by color flow Doppler.  Aorta: The aorta is normal unless otherwise noted.  Venous: The inferior vena cava was not well visualized.  Compared to previous exam: 11/16/17 EF 60-65%. MV 77mmHg peak. TV 17mmHg mean. Moderate AS 30mmHg mean, 70mmHg peak.    +--------------+--------++  LEFT VENTRICLE            +----------------+---------++ +--------------+--------++  Diastology                    PLAX 2D                    +----------------+---------++ +--------------+--------++  LV e' lateral:   8.81 cm/s    LVIDd:         3.40 cm    +----------------+---------++ +--------------+--------++  LV E/e' lateral: 14.3         LVIDs:         2.30 cm    +----------------+---------++ +--------------+--------++  LV e' medial:    3.56 cm/s    LV PW:         1.00 cm    +----------------+---------++ +--------------+--------++  LV E/e' medial:  35.4         LV IVS:        1.30 cm    +----------------+---------++ +--------------+--------++  LVOT diam:     2.00 cm    +--------------+--------++  LV SV:         29 ml      +--------------+--------++  LV SV Index:   15.43      +--------------+--------++  LVOT Area:     3.14 cm   +--------------+--------++                            +--------------+--------++  +---------------+---------++  RIGHT VENTRICLE             +---------------+---------++  RV S prime:     6.18 cm/s   +---------------+---------++  +---------------+-------++-----------++  LEFT ATRIUM              Index         +---------------+-------++-----------++  LA diam:        4.00 cm  2.15 cm/m    +---------------+-------++-----------++  LA Vol (A2C):   67.1 ml  36.13 ml/m   +---------------+-------++-----------++  LA Vol (A4C):   72.4 ml  38.99 ml/m   +---------------+-------++-----------++  LA Biplane Vol: 72.0 ml  38.77 ml/m   +---------------+-------++-----------++ +------------+---------++-----------++  RIGHT ATRIUM            Index         +------------+---------++-----------++  RA Area:     13.90 cm                +------------+---------++-----------++  RA Volume:   31.10 ml   16.75 ml/m   +------------+---------++-----------++  +------------------+------------++  AORTIC VALVE                      +------------------+------------++  AV Area (Vmax):    0.92 cm       +------------------+------------++  AV Area (Vmean):   1.00 cm       +------------------+------------++  AV  Area (VTI):     1.01 cm       +------------------+------------++  AV Vmax:           479.33 cm/s    +------------------+------------++  AV Vmean:          310.800 cm/s   +------------------+------------++  AV VTI:            0.940 m        +------------------+------------++  AV Peak Grad:      91.9 mmHg      +------------------+------------++  AV Mean Grad:      51.3 mmHg      +------------------+------------++  LVOT Vmax:         141.00 cm/s    +------------------+------------++  LVOT Vmean:        99.100 cm/s    +------------------+------------++  LVOT VTI:          0.302 m        +------------------+------------++  LVOT/AV VTI ratio: 0.32           +------------------+------------++   +-------------+-------++  AORTA                   +-------------+-------++  Ao Root diam: 3.20 cm   +-------------+-------++  Ao Asc diam:  3.30 cm   +-------------+-------++  +--------------+-----------++ +---------------+---------++  MITRAL VALVE                  TRICUSPID VALVE             +--------------+-----------++ +---------------+---------++  MV Area (PHT): 2.13 cm       TV Peak grad:   3.8 mmHg    +--------------+-----------++ +---------------+---------++  MV Peak grad:  5.9 mmHg       TV Mean grad:   2.0 mmHg    +--------------+-----------++ +---------------+---------++  MV Mean grad:  3.0 mmHg       TV Vmax:        0.97 m/s    +--------------+-----------++ +---------------+---------++  MV Vmax:       1.21 m/s       TV Vmean:       67.1 cm/s   +--------------+-----------++ +---------------+---------++  MV Vmean:      86.8 cm/s      TV VTI:         0.34 msec   +--------------+-----------++ +---------------+---------++  MV VTI:        0.43 m        +--------------+-----------++ +--------------+-------+  MV PHT:        103.24 msec    SHUNTS                  +--------------+-----------++ +--------------+-------+  MV Decel Time: 356 msec       Systemic VTI:  0.30 m    +--------------+-----------++ +--------------+-------+ +--------------+-----------++  Systemic Diam: 2.00 cm   MV E velocity: 126.00 cm/s   +--------------+-------+ +--------------+-----------++  MV A velocity: 99.20 cm/s    +--------------+-----------++  MV E/A ratio:  1.27          +--------------+-----------++    Fransico Him MD Electronically signed by Fransico Him MD Signature Date/Time: 11/23/2018/2:32:57 PM      RIGHT HEART CATH AND CORONARY ANGIOGRAPHY  Conclusion    Mid RCA lesion is 50% stenosed.  RPDA lesion is 50% stenosed.  Prox RCA lesion is 30% stenosed.  Mid Cx lesion is 20% stenosed.  Ost LAD to Prox LAD lesion is 40% stenosed.  Mid LAD lesion is 30% stenosed.   1. Mild non-obstructive disease in the LAD and Circumflex 2. Moderate non-obstructive disease in the mid and distal RCA, PDA.  3. Severe aortic stenosis by echo. Unable to cross the aortic valve in the cath lab from the radial approach.   Recommendations: Continue workup for TAVR. Medical management of non-obstructive CAD.      Recommendations  Antiplatelet/Anticoag Continue workup for TAVR  Indications  Severe aortic stenosis [I35.0 (ICD-10-CM)]  Procedural Details  Technical Details Indication: Severe aortic stenosis, Workup for TAVR  Procedure: The risks, benefits, complications, treatment options, and expected outcomes were discussed with the patient. The patient and/or family concurred with the proposed plan, giving informed consent. The patient was brought to the cath lab after IV hydration was given. The patient was sedated with Versed and Fentanyl. The IV catheter in the right antecubital vein was changed for a 5 [place] sheath. Right heart catheterization performed with a balloon tipped catheter. The right wrist was prepped and draped in a sterile fashion. 1% lidocaine was used for local anesthesia. Using the modified Seldinger access technique, a 5 French sheath was placed  in the right radial artery. 3 mg Verapamil was given through the sheath. 3500 units IV heparin was given. Standard diagnostic catheters were used to perform selective coronary angiography. I was unable to cross the aortic valve. The sheath was removed from the right radial artery and a Terumo hemostasis band was applied at the arteriotomy site on the right wrist.    Estimated blood loss <50 mL.   During this procedure medications were administered to achieve and maintain moderate conscious sedation while the patient's heart rate, blood pressure, and oxygen saturation were continuously monitored and I was present face-to-face 100% of this time.  Medications (Filter: Administrations occurring from 12/24/18 1350 to 12/24/18 1447) (important)  Continuous medications are totaled by the amount administered until 12/24/18 1447.  Medication Rate/Dose/Volume Action  Date Time   Heparin (Porcine) in NaCl 1000-0.9 UT/500ML-% SOLN (mL) 500 mL Given 12/24/18 1355   Total dose as of 12/24/18 1447 500 mL Given 1356   1,000 mL        fentaNYL (SUBLIMAZE) injection (mcg) 25 mcg Given 12/24/18 1403   Total dose as of 12/24/18 1447        25 mcg        midazolam (VERSED) injection (mg) 1 mg Given 12/24/18 1403   Total dose as of 12/24/18 1447        1 mg        lidocaine (PF) (XYLOCAINE) 1 % injection (mL) 5 mL Given 12/24/18 1404   Total dose as of 12/24/18 1447        5 mL        Radial Cocktail/Verapamil only (mL) 10 mL Given 12/24/18 1406   Total dose as of 12/24/18 1447        10 mL        heparin injection (Units) 3,500 Units Given 12/24/18 1415   Total dose as of 12/24/18 1447        3,500 Units        iohexol (OMNIPAQUE) 350 MG/ML injection (mL) 80 mL Given 12/24/18 1439   Total dose as of 12/24/18 1447        80 mL        Sedation  Time  Sedation Time Physician-1: 30 minutes 31 seconds  Contrast  Medication Name Total Dose  iohexol (OMNIPAQUE) 350 MG/ML injection 80 mL     Radiation/Fluoro  Fluoro time: 14.8 (min) DAP: 17828 (mGycm2) Cumulative Air Kerma: Q000111Q (mGy)  Complications  Complications documented before study signed (12/24/2018 3:10 PM)   RIGHT HEART CATH AND CORONARY ANGIOGRAPHY  None Documented by [name], MD 12/24/2018 2:53 PM  Date Found: 12/24/2018  Time Range: Intraprocedure      Coronary Findings  Diagnostic Dominance: Right Left Anterior Descending  Ost LAD to Prox LAD lesion 40% stenosed  Ost LAD to Prox LAD lesion is 40% stenosed.  Mid LAD lesion 30% stenosed  Mid LAD lesion is 30% stenosed.  Left Circumflex  Vessel is large.  Mid Cx lesion 20% stenosed  Mid Cx lesion is 20% stenosed.  Right Coronary Artery  Vessel is large.  Prox RCA lesion 30% stenosed  Prox RCA lesion is 30% stenosed.  Mid RCA lesion 50% stenosed  Mid RCA lesion is 50% stenosed.  Right Posterior Descending Artery  RPDA lesion 50% stenosed  RPDA lesion is 50% stenosed.  Intervention  No interventions have been documented. Coronary Diagrams  Diagnostic Dominance: Right  Intervention  Implants   No implant documentation for this case.  Syngo Images  Show images for CARDIAC CATHETERIZATION  Images on Long Term Storage  Show images for Krygier, ROSAN CABEZAS to Procedure Log  Procedure Log    Hemo Data   Most Recent Value  Fick Cardiac Output 6.42 L/min  Fick Cardiac Output Index 3.58 (L/min)/BSA  RA A Wave -99 mmHg  RA V Wave 6 mmHg  RA Mean 3 mmHg  RV Systolic Pressure 51 mmHg  RV Diastolic Pressure -5 mmHg  RV EDP 3 mmHg  PA Systolic Pressure 53 mmHg  PA Diastolic Pressure 13 mmHg  PA Mean 32 mmHg  PW A Wave -99 mmHg  PW V Wave 35 mmHg  PW Mean 21 mmHg  AO Systolic Pressure 123XX123 mmHg  AO Diastolic Pressure 53 mmHg  AO Mean 93 mmHg  QP/QS 1  TPVR Index 8.93 HRUI  TSVR Index 21.21 HRUI  PVR SVR Ratio 0.15  TPVR/TSVR Ratio 0.42     Cardiac TAVR CT  TECHNIQUE: The patient was scanned on a  [employer] [id number] slice scanner. A 120 kV retrospective scan was triggered in the descending thoracic aorta at 111 HU's. Gantry rotation speed was 270 msecs and collimation was .9 mm. No beta blockade or nitro were given. The 3D data set was reconstructed in 5% intervals of the R-R cycle. Systolic and diastolic phases were analyzed on a dedicated work station using MPR, MIP and VRT modes. The patient received 80 cc of contrast.  FINDINGS: Aortic Valve: Tri leaflet calcified with restricted leaflet motion  Aorta: Bovine arch no aneurysm moderate calcific atherosclerosis  Sinotubular Junction: 24 mm  Ascending Thoracic Aorta: 32 mm  Aortic Arch: 25 mm  Descending Thoracic Aorta: 23 mm  Sinus of Valsalva Measurements:  Non-coronary: 27 mm  Right - coronary: 26.4 mm  Left - coronary: 28.6 mm  Coronary Artery Height above Annulus:  Left Main: 12.9 mm above annulus  Right Coronary: 12.4 mm above annulus  Virtual Basal Annulus Measurements:  Maximum/Minimum Diameter: 21.4 mm x 26.9 mm  Perimeter: 76.4 mm  Area: 417 mm2  Coronary Arteries: Sufficient height above annulus for deployment  Optimum Fluoroscopic Angle for Delivery: LAO 19 Cranial 2  IMPRESSION: 1.  Calcified tri leaflet AV with annular area of 417 mm2 suitable for a 23 mm Sapien 3 valve  2.  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  Coronary arteries sufficient height above annulus for deployment  4. Optimum angiographic angle for deployment LAO 19 Cranial 2 degrees  5. Patient is post MV annuloplasty and TV annuloplasty repair LAA has been clipped closed with no residual communication with LA  Jenkins Rouge   Electronically Signed   By: Jenkins Rouge M.D.   On: 01/02/2019 12:19    CT ANGIOGRAPHY CHEST, ABDOMEN AND PELVIS  TECHNIQUE: Multidetector CT imaging through the chest, abdomen and pelvis was performed using the standard protocol during bolus  administration of intravenous contrast. Multiplanar reconstructed images and MIPs were obtained and reviewed to evaluate the vascular anatomy.  CONTRAST:  125mL OMNIPAQUE IOHEXOL 350 MG/ML SOLN  COMPARISON:  CTA chest 09/27/2012.  FINDINGS: CTA CHEST FINDINGS  Cardiovascular: Heart size is enlarged. There is no significant pericardial fluid, thickening or pericardial calcification. There is aortic atherosclerosis, as well as atherosclerosis of the great vessels of the mediastinum and the coronary arteries, including calcified atherosclerotic plaque in the left main, left anterior descending, left circumflex and right coronary arteries. Thickening calcification of the aortic valve. Status post mitral and tricuspid annuloplasty.  Mediastinum/Lymph Nodes: No pathologically enlarged mediastinal or hilar lymph nodes. Esophagus is unremarkable in appearance. No axillary lymphadenopathy.  Lungs/Pleura: No acute consolidative airspace disease. No pleural effusions. No suspicious appearing pulmonary nodules or masses are noted.  Musculoskeletal/Soft Tissues: Status post left modified radical mastectomy. Median sternotomy wires. There are no aggressive appearing lytic or blastic lesions noted in the visualized portions of the skeleton.  CTA ABDOMEN AND PELVIS FINDINGS  Hepatobiliary: No suspicious cystic or solid hepatic lesions. No intra or extrahepatic biliary ductal dilatation. 11 mm calcified gallstone in the neck of the gallbladder. No findings to suggest an acute cholecystitis at this time.  Pancreas: Pancreatic atrophy. No pancreatic mass. No pancreatic ductal dilatation. No pancreatic or peripancreatic fluid collections or inflammatory changes.  Spleen: Unremarkable.  Adrenals/Urinary Tract: Subcentimeter low-attenuation lesion in the lower pole of the right kidney, too small to characterize, but statistically likely to represent a tiny cyst. Left kidney  and bilateral adrenal glands are normal in appearance. No hydroureteronephrosis. Urinary bladder is normal in appearance.  Stomach/Bowel: Normal appearance of the stomach. No pathologic dilatation of small bowel or colon. Numerous colonic diverticulae are noted, without surrounding inflammatory changes to suggest an acute diverticulitis at this time. The appendix is not confidently identified and may be surgically absent. Regardless, there are no inflammatory changes noted adjacent to the cecum to suggest the presence of an acute appendicitis at this time.  Vascular/Lymphatic: Aortic atherosclerosis, without evidence of aneurysm or dissection in the abdominal or pelvic vasculature. Vascular findings and measurements pertinent to potential T AVR procedure, as detailed below. No lymphadenopathy noted in the abdomen or pelvis.  Reproductive: Uterus and left ovary are atrophic. In the right adnexal region there is a 4.9 x 3.6 x 4.7 cm low-attenuation lesion, likely to represent an ovarian cyst.  Other: No significant volume of ascites.  No pneumoperitoneum.  Musculoskeletal: There are no aggressive appearing lytic or blastic lesions noted in the visualized portions of the skeleton.  VASCULAR MEASUREMENTS PERTINENT TO TAVR:  AORTA:  Minimal Aortic Diameter-12 x 10 mm  Severity of Aortic Calcification-moderate to severe  RIGHT PELVIS:  Right Common Iliac Artery -  Minimal Diameter-7.4 x 7.1 mm  Tortuosity -  mild  Calcification-moderate  Right External Iliac Artery -  Minimal Diameter-6.9 x 6.8 mm  Tortuosity - mild  Calcification-none  Right Common Femoral Artery -  Minimal Diameter-7.7 x 7.1 mm  Tortuosity - mild  Calcification-mild  LEFT PELVIS:  Left Common Iliac Artery -  Minimal Diameter-8.5 x 7.8 mm  Tortuosity - mild  Calcification-moderate  Left External Iliac Artery -  Minimal Diameter-7.1 x 7.3 mm  Tortuosity  - mild  Calcification-none  Left Common Femoral Artery -  Minimal Diameter-7.3 x 6.4 mm  Tortuosity - mild  Calcification-mild  Review of the MIP images confirms the above findings.  IMPRESSION: 1. Vascular findings and measurements pertinent to potential TAVR procedure, as detailed below. 2. Thickening 2 calcification of the aortic valve, compatible with the reported clinical history of severe aortic stenosis. 3. Aortic atherosclerosis, in addition to left main and 3 vessel coronary artery disease. 4. Mild cardiomegaly. 5. Cholelithiasis without evidence of acute cholecystitis at this time. 6. Colonic diverticulosis without evidence of acute diverticulitis. 7. Cystic lesion in the right adnexal region measuring 4.9 x 3.6 x 4.7 cm. Statistically, this is likely to represent a cyst, however, further evaluation with nonemergent pelvic ultrasound is recommended in the near future to better evaluate this lesion. This recommendation follows ACR consensus guidelines: Management of Incidental Pancreatic Cysts: A White Paper of the ACR Incidental Findings Committee. [place] [id number]. 8. Additional incidental findings, as above.   Electronically Signed   By: Vinnie Langton M.D.   On: 01/02/2019 13:42      Impression:  Patient has stage D severe symptomatic aortic stenosis.  She describes gradual progression of symptoms of exertional shortness of breath and fatigue consistent with chronic diastolic congestive heart failure, [place] Heart Association functional class IIb bordering on class III.  She has had several dizzy spells and a frank syncopal event 2 months ago.  I have personally reviewed the patient's most recent transthoracic echocardiogram, diagnostic cardiac catheterization, and CT angiograms.  Echocardiogram demonstrates normal left ventricular systolic function.  The left ventricular chamber is notably quite small with significant left  ventricular hypertrophy and diastolic dysfunction.  The patient has previously undergone mitral valve repair and tricuspid valve repair.  Both repairs remain intact with no significant residual mitral regurgitation or tricuspid regurgitation.  The aortic valve appears to be trileaflet.  Windows are not optimal.  1 of the leaflets is very thickened and calcified and essentially immobile.  The other 2 leaflets are more mobile but remain significantly diseased.  Peak velocity across aortic valve was measured as high as 4.8 m/s corresponding to mean transvalvular gradient estimated 51 mmHg.  The DVI was notably 0.32 but aortic valve area calculated 0.92 cm.  Diagnostic cardiac catheterization was notable for moderate nonobstructive coronary artery disease and moderate pulmonary hypertension.    I agree the patient would benefit from aortic valve replacement.  I would not consider this patient a candidate for conventional surgical aortic valve replacement because of her advanced age, previous median sternotomy for mitral valve repair, tricuspid valve repair, and Maze procedure, and numerous comorbid medical problems.  Cardiac-gated CTA of the heart reveals anatomical characteristics consistent with aortic stenosis suitable for treatment by transcatheter aortic valve replacement without any significant complicating features and CTA of the aorta and iliac vessels demonstrate what appears to be adequate pelvic vascular access to facilitate a transfemoral approach.  Baseline EKG reveals sinus rhythm with no significant AV conduction delay.  She has not had an  event monitor of any type place to investigate whether or not her syncopal event could have been related to symptomatic bradycardia.     Plan:  The patient and her daughter were counseled at length regarding treatment alternatives for management of severe symptomatic aortic stenosis. Alternative approaches such as conventional aortic valve replacement,  transcatheter aortic valve replacement, and continued medical therapy without intervention were compared and contrasted at length.  The risks associated with conventional surgical aortic valve replacement were discussed in detail, as were expectations for post-operative convalescence, and why I would be reluctant to consider this patient a candidate for conventional surgery.  Issues specific to transcatheter aortic valve replacement were discussed including questions about long term valve durability, the potential for paravalvular leak, possible increased risk of need for permanent pacemaker placement, and other technical complications related to the procedure itself.  Long-term prognosis with medical therapy was discussed. This discussion was placed in the context of the patient's own specific clinical presentation and past medical history.  All of their questions have been addressed.  The patient desires to proceed with transcatheter aortic valve replacement in the near future.  She agrees that she would not wish to undergo redo median sternotomy for conventional surgery under any circumstances.  Following the decision to proceed with transcatheter aortic valve replacement, a discussion has been held regarding what types of management strategies would be attempted intraoperatively in the event of life-threatening complications, including whether or not the patient would be considered a candidate for the use of cardiopulmonary bypass and/or conversion to open sternotomy for attempted surgical intervention.  The patient has been advised of a variety of complications that might develop including but not limited to risks of death, stroke, paravalvular leak, aortic dissection or other major vascular complications, aortic annulus rupture, device embolization, cardiac rupture or perforation, mitral regurgitation, acute myocardial infarction, arrhythmia, heart block or bradycardia requiring permanent pacemaker  placement, congestive heart failure, respiratory failure, renal failure, pneumonia, infection, other late complications related to structural valve deterioration or migration, or other complications that might ultimately cause a temporary or permanent loss of functional independence or other long term morbidity.  The patient provides full informed consent for the procedure as described and all questions were answered.    I spent in excess of 90 minutes during the conduct of this office consultation and >50% of this time involved direct face-to-face encounter with the patient for counseling and/or coordination of their care.   Valentina Gu. Roxy Manns, MD 01/07/2019 2:09 PM

## 2019-01-07 NOTE — Progress Notes (Signed)
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  Patient's cardiac history dates back to 2014 when she presented with TIA in the setting of newly diagnosed paroxysmal atrial fibrillation.  She was found to have severe mitral regurgitation and moderate tricuspid regurgitation and underwent mitral valve repair, tricuspid valve repair, and Maze procedure on October 11, 2012.  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  She also underwent tricuspid ring annuloplasty and maze procedure at that time.  Intraoperative findings were also notable for the presence of aortic valve sclerosis without aortic stenosis on transesophageal echocardiogram.  Her postoperative convalescence was notable only for some transient bradycardia and she was evaluated by Dr. Caryl Comes who did not feel that permanent pacemaker implantation was necessary.  She otherwise did well and was last seen here in our office on December 09, 2013 at which time she was doing well and maintaining sinus rhythm.  She has been followed  intermittently ever since by Dr. Angelena Form and clinically has done well until recently.  On November 07, 2018 the patient was evaluated in the emergency department following a syncopal event.  She subsequently underwent follow-up transthoracic echocardiogram which revealed normal left ventricular systolic function with intact mitral valve repair and tricuspid valve repair but at least moderate if not severe aortic stenosis, although transvalvular gradients were felt to be somewhat difficult to assess.  She was seen in follow-up by Dr. Angelena Form November 29, 2018 and transesophageal echocardiogram was planned to further evaluate the patient's aortic valve disease.  However, TEE was ultimately canceled after the patient presented for TEE and evaluated by Dr. Debara Pickett who felt the patient clearly had severe aortic stenosis I did not feel that TEE was indicated.  The patient subsequently underwent diagnostic cardiac catheterization by Dr. Angelena Form on December 24, 2018.  Attempts across aortic valve by catheterization were unsuccessful.  The patient was noted to have mild nonobstructive coronary disease in the left anterior descending and left circumflex territories with moderate nonobstructive disease in the right coronary artery territory.  Right heart pressures were mild to moderately elevated.  CT angiography was performed and the patient referred for surgical consultation.  Patient is widowed and lives alone locally in [place].  She previously worked for [employer].  She has 3 adult daughters, 1 of whom accompanies her for her office consultation visit today.  Another daughter is a Marine scientist who works at [employer].  Patient has remained functionally independent until recently.  However, she describes a gradual progression of symptoms of exertional shortness of breath and decreased energy for the past year or so.  She now gets short of breath with low-level activity.  She has not had chest pain or  chest tightness either with activity  or at rest.  She denies resting shortness of breath, PND, orthopnea, or lower extremity edema.  She has had several dizzy spells and one frank syncopal event that occurred this past August.  At the time she was standing in her shower and passed out and fell without any warning.  She has not had palpitations.  Her syncopal event and dizzy spells do not seem to be related to exertion but can be somewhat positional.  Past Medical History:  Diagnosis Date   Arthritis    knees   Atrial fibrillation (HCC)    Cataracts, bilateral    Diabetes mellitus    GERD (gastroesophageal reflux disease)    Hypertension    Mitral regurgitation    PONV (postoperative nausea and vomiting)    S/P Maze operation for atrial fibrillation 10/11/2012   Complete bilateral atrial lesion set using bipolar radiofrequency and cryothermy ablation with clipping of LA appendage   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   52mm Sorin Memo 3D ring annuloplasty   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   65mm Sorin Memo 3D ring annuloplasty with 26 mm Edwards mc3 tricuspid ring annuloplasty    Shortness of breath    with activity   Stroke ([place])    mini stroke with vision problems   Tricuspid regurgitation     Past Surgical History:  Procedure Laterality Date   CARDIAC CATHETERIZATION  09-05-12   CARDIOVERSION N/A 07/31/2012   Procedure: CARDIOVERSION;  Surgeon: Josue Hector, MD;  Location: [facility];  Service: Cardiovascular;  Laterality: N/A;   CESAREAN SECTION     x 2   COLONOSCOPY WITH PROPOFOL N/A 11/28/2014   Procedure: COLONOSCOPY WITH PROPOFOL;  Surgeon: Carol Ada, MD;  Location: [facility];  Service: Endoscopy;  Laterality: N/A;   FLEXIBLE SIGMOIDOSCOPY N/A 12/18/2015   Procedure: FLEXIBLE SIGMOIDOSCOPY;  Surgeon: Carol Ada, MD;  Location: [facility];  Service: Endoscopy;  Laterality: N/A;   INTRAOPERATIVE TRANSESOPHAGEAL ECHOCARDIOGRAM N/A 10/11/2012    Procedure: INTRAOPERATIVE TRANSESOPHAGEAL ECHOCARDIOGRAM;  Surgeon: Rexene Alberts, MD;  Location: [facility];  Service: Open Heart Surgery;  Laterality: N/A;   MASTECTOMY Left 1982   MAZE N/A 10/11/2012   Procedure: MAZE;  Surgeon: Rexene Alberts, MD;  Location: [facility];  Service: Open Heart Surgery;  Laterality: N/A;   MITRAL VALVE REPAIR N/A 10/11/2012   Procedure: MITRAL VALVE REPAIR (MVR);  Surgeon: Rexene Alberts, MD;  Location: [facility];  Service: Open Heart Surgery;  Laterality: N/A;   RIGHT HEART CATH AND CORONARY ANGIOGRAPHY N/A 12/24/2018   Procedure: RIGHT HEART CATH AND CORONARY ANGIOGRAPHY;  Surgeon: [name], MD;  Location: [facility];  Service: Cardiovascular;  Laterality: N/A;   TEE WITHOUT CARDIOVERSION N/A 07/31/2012   Procedure: TRANSESOPHAGEAL ECHOCARDIOGRAM (TEE);  Surgeon: Josue Hector, MD;  Location: [facility];  Service: Cardiovascular;  Laterality: N/A;   TRICUSPID VALVE REPLACEMENT N/A 10/11/2012   Procedure: TRICUSPID VALVE REPAIR;  Surgeon: Rexene Alberts, MD;  Location: [facility];  Service: Open Heart Surgery;  Laterality: N/A;   TUBAL LIGATION      Family History  Problem Relation Age of Onset   Cancer Father        prostate   Hypertension Father    Heart attack Paternal Grandfather    Stroke Neg Hx     Social History   Socioeconomic History   Marital status: Widowed    Spouse name: Not on file   Number of children: 3   Years  of education: Not on file   Highest education level: Not on file  Occupational History   Occupation: Retired-[employer] resource strain: Not on file   Food insecurity    Worry: Not on file    Inability: Not on file   Transportation needs    Medical: Not on file    Non-medical: Not on file  Tobacco Use   Smoking status: Never Smoker   Smokeless tobacco: Never Used  Substance and Sexual Activity   Alcohol use: No   Drug use: No   Sexual  activity: Never  Lifestyle   Physical activity    Days per week: Not on file    Minutes per session: Not on file   Stress: Not on file  Relationships   Social connections    Talks on phone: Not on file    Gets together: Not on file    Attends religious service: Not on file    Active member of club or organization: Not on file    Attends meetings of clubs or organizations: Not on file    Relationship status: Not on file   Intimate partner violence    Fear of current or ex partner: Not on file    Emotionally abused: Not on file    Physically abused: Not on file    Forced sexual activity: Not on file  Other Topics Concern   Not on file  Social History Narrative   Not on file    Current Outpatient Medications  Medication Sig Dispense Refill   acetaminophen (TYLENOL) 325 MG tablet Take 650 mg by mouth every 6 (six) hours as needed for moderate pain or headache.     busPIRone (BUSPAR) 10 MG tablet Take 10 mg by mouth 2 (two) times daily.      Calcium Carb-Cholecalciferol (CALCIUM 600+D3 PO) Take 1 tablet by mouth 2 (two) times daily.     Insulin Detemir (LEVEMIR FLEXPEN) 100 UNIT/ML Pen Inject 32 Units into the skin daily.      lovastatin (MEVACOR) 40 MG tablet TAKE 1 TABLET BY MOUTH DAILY AT BEDTIME 30 tablet 5   Multiple Vitamin (MULTIVITAMIN WITH MINERALS) TABS tablet Take 1 tablet by mouth daily.     omeprazole (PRILOSEC) 40 MG capsule Take 40 mg by mouth daily before breakfast.      Rivaroxaban (XARELTO) 15 MG TABS tablet Take 15 mg by mouth daily with supper.     Semaglutide,0.25 or 0.5MG /DOS, (OZEMPIC, 0.25 OR 0.5 MG/DOSE,) 2 MG/1.5ML SOPN Inject 0.5 mg into the skin every Sunday.     venlafaxine XR (EFFEXOR-XR) 150 MG 24 hr capsule Take 150 mg by mouth daily with breakfast. Take with 75 mg capsule     venlafaxine XR (EFFEXOR-XR) 75 MG 24 hr capsule Take 75 mg by mouth daily with breakfast. Take with 150 mg capsule     diclofenac sodium (VOLTAREN) 1 % GEL  Apply 2 g topically 4 (four) times daily. (Patient not taking: Reported on 01/07/2019) 100 g 1   hydrocortisone (ANUSOL-HC) 25 MG suppository Place 1 suppository (25 mg total) rectally 2 (two) times daily. (Patient not taking: Reported on 01/07/2019) 12 suppository 0   No current facility-administered medications for this visit.     Allergies  Allergen Reactions   Ultram [Tramadol] Nausea And Vomiting   Nickel Rash    Pt unable to wear jewelry made of nickel.      Review of Systems:   General:  Decreased  appetite, decreased energy, no weight gain, + weight loss, no fever  Cardiac:  no chest pain with exertion, no chest pain at rest, +SOB with exertion, no resting SOB, no PND, no orthopnea, no palpitations, no arrhythmia, no atrial fibrillation, no LE edema, + dizzy spells, + syncope  Respiratory:  + shortness of breath, no home oxygen, + productive cough, + dry cough, no bronchitis, no wheezing, no hemoptysis, no asthma, no pain with inspiration or cough, no sleep apnea, no CPAP at night  GI:   no difficulty swallowing, + reflux, no frequent heartburn, no hiatal hernia, no abdominal pain, no constipation, no diarrhea, no hematochezia, no hematemesis, no melena  GU:   no dysuria,  no frequency, no urinary tract infection, no hematuria, no kidney stones, + kidney disease  Vascular:  no pain suggestive of claudication, no pain in feet, no leg cramps, + varicose veins, no DVT, no non-healing foot ulcer  Neuro:   no stroke, + TIA in 2014, no seizures, no headaches, no recent episodes temporary blindness one eye,  no slurred speech, + peripheral neuropathy, + chronic pain, + instability of gait, no memory/cognitive dysfunction  Musculoskeletal: + arthritis, + joint swelling, no myalgias, some difficulty walking, decreased mobility   Skin:   no rash, no itching, no skin infections, no pressure sores or ulcerations  Psych:   + anxiety, + depression, + nervousness, no unusual recent  stress  Eyes:   no blurry vision, no floaters, no recent vision changes, does not wear glasses or contacts  ENT:   no hearing loss, no loose or painful teeth, does not recall most recent visit to dentist  Hematologic:  + easy bruising, no abnormal bleeding, no clotting disorder, no frequent epistaxis  Endocrine:  + diabetes, does check CBG's at home           Physical Exam:   BP (!) 147/75 (BP Location: Right Arm)    Pulse 72    Temp 97.7 F (36.5 C) (Skin)    Resp 20    Ht 5\' 6"  (1.676 m)    Wt 152 lb 9.6 oz (69.2 kg)    SpO2 97% Comment: RA   BMI 24.63 kg/m   General:  Elderly,  well-appearing  HEENT:  Unremarkable   Neck:   no JVD, no bruits, no adenopathy   Chest:   clear to auscultation, symmetrical breath sounds, no wheezes, no rhonchi   CV:   RRR, grade III/VI crescendo/decrescendo murmur heard best at RSB,  no diastolic murmur  Abdomen:  soft, non-tender, no masses   Extremities:  warm, well-perfused, pulses palpable, no LE edema  Rectal/GU  Deferred  Neuro:   Grossly non-focal and symmetrical throughout  Skin:   Clean and dry, no rashes, no breakdown   Diagnostic Tests:  EKG: NSR w/out significant AV conduction delay    ECHOCARDIOGRAM REPORT       Patient Name:   Rachel Santos Date of Exam: 11/23/2018 Medical Rec #:  [id number]         Height:       66.0 in Accession #:    [id number]        Weight:       168.0 lb Date of Birth:  27-Apr-1940        BSA:          1.86 m Patient Age:    55 years          BP:  147/53 mmHg Patient Gender: F                 HR:           70 bpm. Exam Location:  [address]    Procedure: 2D Echo, Cardiac Doppler, Color Doppler and Intracardiac            Opacification Agent  Indications:    I35 Aortic stenosis.   History:        Patient has prior history of Echocardiogram examinations, most                 recent 11/16/2017. Stroke Atrial Fibrillation Aortic Valve                 Disease Signs/Symptoms:  Shortness of Breath Risk Factors:                 Hypertension and Diabetes. CKD stage 2. MV repair, TV repair and                 Maze procedure (2017).   Sonographer:    Jessee Avers, RDCS Referring Phys: [place]    1. The left ventricle has hyperdynamic systolic function, with an ejection fraction of >65%. The cavity size was normal. There is moderate asymmetric left ventricular hypertrophy. Left ventricular diastolic Doppler parameters are consistent with  pseudonormalization. Elevated left atrial and left ventricular end-diastolic pressures.  2. The right ventricle has normal systolic function. The cavity was normal. There is no increase in right ventricular wall thickness. Right ventricular systolic pressure could not be assessed.  3. Left atrial size was mildly dilated.  4. S/P MV repair. The mean MV gradient is normal at 31mmHg. There is no MR. MVA by PHT is 2.13cm2.  5. The tricuspid valve is abnormal.  6. S/P TV repair with normal mean MVG of 3mmHg and no TR.  7. The aortic valve is tricuspid. Moderate thickening of the aortic valve. Moderate calcification of the aortic valve with decreased cusp movement. Doppler of the AV and LVOT cannot be acurately assessed. There is significant mid cavitary and LVOT  turbulence with high gradient. Cannot give accurate AV gradients, peak velocity or AVA based on this study due to sampling issues in the presence of severe BSH, hyperdynamic LVF and AS all contributing to the gradient. There is evidence of a dagger  shaped velocity within the aortic doppler sample making differentiation impossible. Visually there appears to be moderate AS. Consider TEE if clinically indicated to get a better assessment of AS severity.  8. The aorta is normal unless otherwise noted.  FINDINGS  Left Ventricle: The left ventricle has hyperdynamic systolic function, with an ejection fraction of >65%. The cavity size was normal. There is  moderate asymmetric left ventricular hypertrophy. Left ventricular diastolic Doppler parameters are consistent  with pseudonormalization. Elevated left atrial and left ventricular end-diastolic pressures Definity contrast agent was given IV to delineate the left ventricular endocardial borders.  Right Ventricle: The right ventricle has normal systolic function. The cavity was normal. There is no increase in right ventricular wall thickness. Right ventricular systolic pressure could not be assessed.  Left Atrium: Left atrial size was mildly dilated.  Right Atrium: Right atrial size was normal in size.  Interatrial Septum: No atrial level shunt detected by color flow Doppler.  Pericardium: There is no evidence of pericardial effusion.  Mitral Valve: The mitral valve has been repaired/replaced. Mitral valve regurgitation was not assessed  by color flow Doppler. S/P MV repair. The mean MV gradient is normal at 69mmHg. There is no MR. MVA by PHT is 2.13cm2.  Tricuspid Valve: The tricuspid valve is abnormal. Tricuspid valve regurgitation was not visualized by color flow Doppler. S/P TV repair with normal mean MVG of 6mmHg and no TR.  Aortic Valve: The aortic valve is tricuspid Moderate thickening of the aortic valve. Moderate calcification of the aortic valve. Aortic valve regurgitation was not visualized by color flow Doppler.  Pulmonic Valve: The pulmonic valve was normal in structure. Pulmonic valve regurgitation is trivial by color flow Doppler.  Aorta: The aorta is normal unless otherwise noted.  Venous: The inferior vena cava was not well visualized.  Compared to previous exam: 11/16/17 EF 60-65%. MV 32mmHg peak. TV 5mmHg mean. Moderate AS 72mmHg mean, 64mmHg peak.    +--------------+--------++  LEFT VENTRICLE            +----------------+---------++ +--------------+--------++  Diastology                    PLAX 2D                    +----------------+---------++ +--------------+--------++  LV e' lateral:   8.81 cm/s    LVIDd:         3.40 cm    +----------------+---------++ +--------------+--------++  LV E/e' lateral: 14.3         LVIDs:         2.30 cm    +----------------+---------++ +--------------+--------++  LV e' medial:    3.56 cm/s    LV PW:         1.00 cm    +----------------+---------++ +--------------+--------++  LV E/e' medial:  35.4         LV IVS:        1.30 cm    +----------------+---------++ +--------------+--------++  LVOT diam:     2.00 cm    +--------------+--------++  LV SV:         29 ml      +--------------+--------++  LV SV Index:   15.43      +--------------+--------++  LVOT Area:     3.14 cm   +--------------+--------++                            +--------------+--------++  +---------------+---------++  RIGHT VENTRICLE             +---------------+---------++  RV S prime:     6.18 cm/s   +---------------+---------++  +---------------+-------++-----------++  LEFT ATRIUM              Index         +---------------+-------++-----------++  LA diam:        4.00 cm  2.15 cm/m    +---------------+-------++-----------++  LA Vol (A2C):   67.1 ml  36.13 ml/m   +---------------+-------++-----------++  LA Vol (A4C):   72.4 ml  38.99 ml/m   +---------------+-------++-----------++  LA Biplane Vol: 72.0 ml  38.77 ml/m   +---------------+-------++-----------++ +------------+---------++-----------++  RIGHT ATRIUM            Index         +------------+---------++-----------++  RA Area:     13.90 cm                +------------+---------++-----------++  RA Volume:   31.10 ml   16.75 ml/m   +------------+---------++-----------++  +------------------+------------++  AORTIC VALVE                      +------------------+------------++  AV Area (Vmax):    0.92 cm       +------------------+------------++  AV Area (Vmean):   1.00 cm       +------------------+------------++  AV  Area (VTI):     1.01 cm       +------------------+------------++  AV Vmax:           479.33 cm/s    +------------------+------------++  AV Vmean:          310.800 cm/s   +------------------+------------++  AV VTI:            0.940 m        +------------------+------------++  AV Peak Grad:      91.9 mmHg      +------------------+------------++  AV Mean Grad:      51.3 mmHg      +------------------+------------++  LVOT Vmax:         141.00 cm/s    +------------------+------------++  LVOT Vmean:        99.100 cm/s    +------------------+------------++  LVOT VTI:          0.302 m        +------------------+------------++  LVOT/AV VTI ratio: 0.32           +------------------+------------++   +-------------+-------++  AORTA                   +-------------+-------++  Ao Root diam: 3.20 cm   +-------------+-------++  Ao Asc diam:  3.30 cm   +-------------+-------++  +--------------+-----------++ +---------------+---------++  MITRAL VALVE                  TRICUSPID VALVE             +--------------+-----------++ +---------------+---------++  MV Area (PHT): 2.13 cm       TV Peak grad:   3.8 mmHg    +--------------+-----------++ +---------------+---------++  MV Peak grad:  5.9 mmHg       TV Mean grad:   2.0 mmHg    +--------------+-----------++ +---------------+---------++  MV Mean grad:  3.0 mmHg       TV Vmax:        0.97 m/s    +--------------+-----------++ +---------------+---------++  MV Vmax:       1.21 m/s       TV Vmean:       67.1 cm/s   +--------------+-----------++ +---------------+---------++  MV Vmean:      86.8 cm/s      TV VTI:         0.34 msec   +--------------+-----------++ +---------------+---------++  MV VTI:        0.43 m        +--------------+-----------++ +--------------+-------+  MV PHT:        103.24 msec    SHUNTS                  +--------------+-----------++ +--------------+-------+  MV Decel Time: 356 msec       Systemic VTI:  0.30 m    +--------------+-----------++ +--------------+-------+ +--------------+-----------++  Systemic Diam: 2.00 cm   MV E velocity: 126.00 cm/s   +--------------+-------+ +--------------+-----------++  MV A velocity: 99.20 cm/s    +--------------+-----------++  MV E/A ratio:  1.27          +--------------+-----------++    Fransico Him MD Electronically signed by Fransico Him MD Signature Date/Time: 11/23/2018/2:32:57 PM      RIGHT HEART CATH AND CORONARY ANGIOGRAPHY  Conclusion    Mid RCA lesion is 50% stenosed.  RPDA lesion is 50% stenosed.  Prox RCA lesion is 30% stenosed.  Mid Cx lesion is 20% stenosed.  Ost LAD to Prox LAD lesion is 40% stenosed.  Mid LAD lesion is 30% stenosed.   1. Mild non-obstructive disease in the LAD and Circumflex 2. Moderate non-obstructive disease in the mid and distal RCA, PDA.  3. Severe aortic stenosis by echo. Unable to cross the aortic valve in the cath lab from the radial approach.   Recommendations: Continue workup for TAVR. Medical management of non-obstructive CAD.      Recommendations  Antiplatelet/Anticoag Continue workup for TAVR  Indications  Severe aortic stenosis [I35.0 (ICD-10-CM)]  Procedural Details  Technical Details Indication: Severe aortic stenosis, Workup for TAVR  Procedure: The risks, benefits, complications, treatment options, and expected outcomes were discussed with the patient. The patient and/or family concurred with the proposed plan, giving informed consent. The patient was brought to the cath lab after IV hydration was given. The patient was sedated with Versed and Fentanyl. The IV catheter in the right antecubital vein was changed for a 5 [place] sheath. Right heart catheterization performed with a balloon tipped catheter. The right wrist was prepped and draped in a sterile fashion. 1% lidocaine was used for local anesthesia. Using the modified Seldinger access technique, a 5 French sheath was placed  in the right radial artery. 3 mg Verapamil was given through the sheath. 3500 units IV heparin was given. Standard diagnostic catheters were used to perform selective coronary angiography. I was unable to cross the aortic valve. The sheath was removed from the right radial artery and a Terumo hemostasis band was applied at the arteriotomy site on the right wrist.    Estimated blood loss <50 mL.   During this procedure medications were administered to achieve and maintain moderate conscious sedation while the patient's heart rate, blood pressure, and oxygen saturation were continuously monitored and I was present face-to-face 100% of this time.  Medications (Filter: Administrations occurring from 12/24/18 1350 to 12/24/18 1447) (important)  Continuous medications are totaled by the amount administered until 12/24/18 1447.  Medication Rate/Dose/Volume Action  Date Time   Heparin (Porcine) in NaCl 1000-0.9 UT/500ML-% SOLN (mL) 500 mL Given 12/24/18 1355   Total dose as of 12/24/18 1447 500 mL Given 1356   1,000 mL        fentaNYL (SUBLIMAZE) injection (mcg) 25 mcg Given 12/24/18 1403   Total dose as of 12/24/18 1447        25 mcg        midazolam (VERSED) injection (mg) 1 mg Given 12/24/18 1403   Total dose as of 12/24/18 1447        1 mg        lidocaine (PF) (XYLOCAINE) 1 % injection (mL) 5 mL Given 12/24/18 1404   Total dose as of 12/24/18 1447        5 mL        Radial Cocktail/Verapamil only (mL) 10 mL Given 12/24/18 1406   Total dose as of 12/24/18 1447        10 mL        heparin injection (Units) 3,500 Units Given 12/24/18 1415   Total dose as of 12/24/18 1447        3,500 Units        iohexol (OMNIPAQUE) 350 MG/ML injection (mL) 80 mL Given 12/24/18 1439   Total dose as of 12/24/18 1447        80 mL        Sedation  Time  Sedation Time Physician-1: 30 minutes 31 seconds  Contrast  Medication Name Total Dose  iohexol (OMNIPAQUE) 350 MG/ML injection 80 mL     Radiation/Fluoro  Fluoro time: 14.8 (min) DAP: 17828 (mGycm2) Cumulative Air Kerma: Q000111Q (mGy)  Complications  Complications documented before study signed (12/24/2018 3:10 PM)   RIGHT HEART CATH AND CORONARY ANGIOGRAPHY  None Documented by [name], MD 12/24/2018 2:53 PM  Date Found: 12/24/2018  Time Range: Intraprocedure      Coronary Findings  Diagnostic Dominance: Right Left Anterior Descending  Ost LAD to Prox LAD lesion 40% stenosed  Ost LAD to Prox LAD lesion is 40% stenosed.  Mid LAD lesion 30% stenosed  Mid LAD lesion is 30% stenosed.  Left Circumflex  Vessel is large.  Mid Cx lesion 20% stenosed  Mid Cx lesion is 20% stenosed.  Right Coronary Artery  Vessel is large.  Prox RCA lesion 30% stenosed  Prox RCA lesion is 30% stenosed.  Mid RCA lesion 50% stenosed  Mid RCA lesion is 50% stenosed.  Right Posterior Descending Artery  RPDA lesion 50% stenosed  RPDA lesion is 50% stenosed.  Intervention  No interventions have been documented. Coronary Diagrams  Diagnostic Dominance: Right  Intervention  Implants   No implant documentation for this case.  Syngo Images  Show images for CARDIAC CATHETERIZATION  Images on Long Term Storage  Show images for Fraiser, ROWAN LUEBBE to Procedure Log  Procedure Log    Hemo Data   Most Recent Value  Fick Cardiac Output 6.42 L/min  Fick Cardiac Output Index 3.58 (L/min)/BSA  RA A Wave -99 mmHg  RA V Wave 6 mmHg  RA Mean 3 mmHg  RV Systolic Pressure 51 mmHg  RV Diastolic Pressure -5 mmHg  RV EDP 3 mmHg  PA Systolic Pressure 53 mmHg  PA Diastolic Pressure 13 mmHg  PA Mean 32 mmHg  PW A Wave -99 mmHg  PW V Wave 35 mmHg  PW Mean 21 mmHg  AO Systolic Pressure 123XX123 mmHg  AO Diastolic Pressure 53 mmHg  AO Mean 93 mmHg  QP/QS 1  TPVR Index 8.93 HRUI  TSVR Index 21.21 HRUI  PVR SVR Ratio 0.15  TPVR/TSVR Ratio 0.42     Cardiac TAVR CT  TECHNIQUE: The patient was scanned on a  [employer] [id number] slice scanner. A 120 kV retrospective scan was triggered in the descending thoracic aorta at 111 HU's. Gantry rotation speed was 270 msecs and collimation was .9 mm. No beta blockade or nitro were given. The 3D data set was reconstructed in 5% intervals of the R-R cycle. Systolic and diastolic phases were analyzed on a dedicated work station using MPR, MIP and VRT modes. The patient received 80 cc of contrast.  FINDINGS: Aortic Valve: Tri leaflet calcified with restricted leaflet motion  Aorta: Bovine arch no aneurysm moderate calcific atherosclerosis  Sinotubular Junction: 24 mm  Ascending Thoracic Aorta: 32 mm  Aortic Arch: 25 mm  Descending Thoracic Aorta: 23 mm  Sinus of Valsalva Measurements:  Non-coronary: 27 mm  Right - coronary: 26.4 mm  Left - coronary: 28.6 mm  Coronary Artery Height above Annulus:  Left Main: 12.9 mm above annulus  Right Coronary: 12.4 mm above annulus  Virtual Basal Annulus Measurements:  Maximum/Minimum Diameter: 21.4 mm x 26.9 mm  Perimeter: 76.4 mm  Area: 417 mm2  Coronary Arteries: Sufficient height above annulus for deployment  Optimum Fluoroscopic Angle for Delivery: LAO 19 Cranial 2  IMPRESSION: 1.  Calcified tri leaflet AV with annular area of 417 mm2 suitable for a 23 mm Sapien 3 valve  2.  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  Coronary arteries sufficient height above annulus for deployment  4. Optimum angiographic angle for deployment LAO 19 Cranial 2 degrees  5. Patient is post MV annuloplasty and TV annuloplasty repair LAA has been clipped closed with no residual communication with LA  Jenkins Rouge   Electronically Signed   By: Jenkins Rouge M.D.   On: 01/02/2019 12:19    CT ANGIOGRAPHY CHEST, ABDOMEN AND PELVIS  TECHNIQUE: Multidetector CT imaging through the chest, abdomen and pelvis was performed using the standard protocol during bolus  administration of intravenous contrast. Multiplanar reconstructed images and MIPs were obtained and reviewed to evaluate the vascular anatomy.  CONTRAST:  136mL OMNIPAQUE IOHEXOL 350 MG/ML SOLN  COMPARISON:  CTA chest 09/27/2012.  FINDINGS: CTA CHEST FINDINGS  Cardiovascular: Heart size is enlarged. There is no significant pericardial fluid, thickening or pericardial calcification. There is aortic atherosclerosis, as well as atherosclerosis of the great vessels of the mediastinum and the coronary arteries, including calcified atherosclerotic plaque in the left main, left anterior descending, left circumflex and right coronary arteries. Thickening calcification of the aortic valve. Status post mitral and tricuspid annuloplasty.  Mediastinum/Lymph Nodes: No pathologically enlarged mediastinal or hilar lymph nodes. Esophagus is unremarkable in appearance. No axillary lymphadenopathy.  Lungs/Pleura: No acute consolidative airspace disease. No pleural effusions. No suspicious appearing pulmonary nodules or masses are noted.  Musculoskeletal/Soft Tissues: Status post left modified radical mastectomy. Median sternotomy wires. There are no aggressive appearing lytic or blastic lesions noted in the visualized portions of the skeleton.  CTA ABDOMEN AND PELVIS FINDINGS  Hepatobiliary: No suspicious cystic or solid hepatic lesions. No intra or extrahepatic biliary ductal dilatation. 11 mm calcified gallstone in the neck of the gallbladder. No findings to suggest an acute cholecystitis at this time.  Pancreas: Pancreatic atrophy. No pancreatic mass. No pancreatic ductal dilatation. No pancreatic or peripancreatic fluid collections or inflammatory changes.  Spleen: Unremarkable.  Adrenals/Urinary Tract: Subcentimeter low-attenuation lesion in the lower pole of the right kidney, too small to characterize, but statistically likely to represent a tiny cyst. Left kidney  and bilateral adrenal glands are normal in appearance. No hydroureteronephrosis. Urinary bladder is normal in appearance.  Stomach/Bowel: Normal appearance of the stomach. No pathologic dilatation of small bowel or colon. Numerous colonic diverticulae are noted, without surrounding inflammatory changes to suggest an acute diverticulitis at this time. The appendix is not confidently identified and may be surgically absent. Regardless, there are no inflammatory changes noted adjacent to the cecum to suggest the presence of an acute appendicitis at this time.  Vascular/Lymphatic: Aortic atherosclerosis, without evidence of aneurysm or dissection in the abdominal or pelvic vasculature. Vascular findings and measurements pertinent to potential T AVR procedure, as detailed below. No lymphadenopathy noted in the abdomen or pelvis.  Reproductive: Uterus and left ovary are atrophic. In the right adnexal region there is a 4.9 x 3.6 x 4.7 cm low-attenuation lesion, likely to represent an ovarian cyst.  Other: No significant volume of ascites.  No pneumoperitoneum.  Musculoskeletal: There are no aggressive appearing lytic or blastic lesions noted in the visualized portions of the skeleton.  VASCULAR MEASUREMENTS PERTINENT TO TAVR:  AORTA:  Minimal Aortic Diameter-12 x 10 mm  Severity of Aortic Calcification-moderate to severe  RIGHT PELVIS:  Right Common Iliac Artery -  Minimal Diameter-7.4 x 7.1 mm  Tortuosity -  mild  Calcification-moderate  Right External Iliac Artery -  Minimal Diameter-6.9 x 6.8 mm  Tortuosity - mild  Calcification-none  Right Common Femoral Artery -  Minimal Diameter-7.7 x 7.1 mm  Tortuosity - mild  Calcification-mild  LEFT PELVIS:  Left Common Iliac Artery -  Minimal Diameter-8.5 x 7.8 mm  Tortuosity - mild  Calcification-moderate  Left External Iliac Artery -  Minimal Diameter-7.1 x 7.3 mm  Tortuosity  - mild  Calcification-none  Left Common Femoral Artery -  Minimal Diameter-7.3 x 6.4 mm  Tortuosity - mild  Calcification-mild  Review of the MIP images confirms the above findings.  IMPRESSION: 1. Vascular findings and measurements pertinent to potential TAVR procedure, as detailed below. 2. Thickening 2 calcification of the aortic valve, compatible with the reported clinical history of severe aortic stenosis. 3. Aortic atherosclerosis, in addition to left main and 3 vessel coronary artery disease. 4. Mild cardiomegaly. 5. Cholelithiasis without evidence of acute cholecystitis at this time. 6. Colonic diverticulosis without evidence of acute diverticulitis. 7. Cystic lesion in the right adnexal region measuring 4.9 x 3.6 x 4.7 cm. Statistically, this is likely to represent a cyst, however, further evaluation with nonemergent pelvic ultrasound is recommended in the near future to better evaluate this lesion. This recommendation follows ACR consensus guidelines: Management of Incidental Pancreatic Cysts: A White Paper of the ACR Incidental Findings Committee. [place] [id number]. 8. Additional incidental findings, as above.   Electronically Signed   By: Vinnie Langton M.D.   On: 01/02/2019 13:42      Impression:  Patient has stage D severe symptomatic aortic stenosis.  She describes gradual progression of symptoms of exertional shortness of breath and fatigue consistent with chronic diastolic congestive heart failure, New York Heart Association functional class IIb bordering on class III.  She has had several dizzy spells and a frank syncopal event 2 months ago.  I have personally reviewed the patient's most recent transthoracic echocardiogram, diagnostic cardiac catheterization, and CT angiograms.  Echocardiogram demonstrates normal left ventricular systolic function.  The left ventricular chamber is notably quite small with significant left  ventricular hypertrophy and diastolic dysfunction.  The patient has previously undergone mitral valve repair and tricuspid valve repair.  Both repairs remain intact with no significant residual mitral regurgitation or tricuspid regurgitation.  The aortic valve appears to be trileaflet.  Windows are not optimal.  1 of the leaflets is very thickened and calcified and essentially immobile.  The other 2 leaflets are more mobile but remain significantly diseased.  Peak velocity across aortic valve was measured as high as 4.8 m/s corresponding to mean transvalvular gradient estimated 51 mmHg.  The DVI was notably 0.32 but aortic valve area calculated 0.92 cm.  Diagnostic cardiac catheterization was notable for moderate nonobstructive coronary artery disease and moderate pulmonary hypertension.    I agree the patient would benefit from aortic valve replacement.  I would not consider this patient a candidate for conventional surgical aortic valve replacement because of her advanced age, previous median sternotomy for mitral valve repair, tricuspid valve repair, and Maze procedure, and numerous comorbid medical problems.  Cardiac-gated CTA of the heart reveals anatomical characteristics consistent with aortic stenosis suitable for treatment by transcatheter aortic valve replacement without any significant complicating features and CTA of the aorta and iliac vessels demonstrate what appears to be adequate pelvic vascular access to facilitate a transfemoral approach.  Baseline EKG reveals sinus rhythm with no significant AV conduction delay.  She has not had an  event monitor of any type place to investigate whether or not her syncopal event could have been related to symptomatic bradycardia.     Plan:  The patient and her daughter were counseled at length regarding treatment alternatives for management of severe symptomatic aortic stenosis. Alternative approaches such as conventional aortic valve replacement,  transcatheter aortic valve replacement, and continued medical therapy without intervention were compared and contrasted at length.  The risks associated with conventional surgical aortic valve replacement were discussed in detail, as were expectations for post-operative convalescence, and why I would be reluctant to consider this patient a candidate for conventional surgery.  Issues specific to transcatheter aortic valve replacement were discussed including questions about long term valve durability, the potential for paravalvular leak, possible increased risk of need for permanent pacemaker placement, and other technical complications related to the procedure itself.  Long-term prognosis with medical therapy was discussed. This discussion was placed in the context of the patient's own specific clinical presentation and past medical history.  All of their questions have been addressed.  The patient desires to proceed with transcatheter aortic valve replacement in the near future.  She agrees that she would not wish to undergo redo median sternotomy for conventional surgery under any circumstances.  Following the decision to proceed with transcatheter aortic valve replacement, a discussion has been held regarding what types of management strategies would be attempted intraoperatively in the event of life-threatening complications, including whether or not the patient would be considered a candidate for the use of cardiopulmonary bypass and/or conversion to open sternotomy for attempted surgical intervention.  The patient has been advised of a variety of complications that might develop including but not limited to risks of death, stroke, paravalvular leak, aortic dissection or other major vascular complications, aortic annulus rupture, device embolization, cardiac rupture or perforation, mitral regurgitation, acute myocardial infarction, arrhythmia, heart block or bradycardia requiring permanent pacemaker  placement, congestive heart failure, respiratory failure, renal failure, pneumonia, infection, other late complications related to structural valve deterioration or migration, or other complications that might ultimately cause a temporary or permanent loss of functional independence or other long term morbidity.  The patient provides full informed consent for the procedure as described and all questions were answered.    I spent in excess of 90 minutes during the conduct of this office consultation and >50% of this time involved direct face-to-face encounter with the patient for counseling and/or coordination of their care.   Valentina Gu. Roxy Manns, MD 01/07/2019 2:09 PM

## 2019-01-07 NOTE — Therapy (Signed)
[place], [place], [address] Phone: [phone]   Fax:  [phone]  Physical Therapy Evaluation  Patient Details  Name: Rachel Santos MRN: [id number] Date of Birth: 06/29/1940 Referring Provider (PT): Murvin Natal MD   Encounter Date: 01/07/2019  PT End of Session - 01/07/19 1717    Visit Number  1    Number of Visits  1    Date for PT Re-Evaluation  01/07/19    PT Start Time  [id number]    PT Stop Time  1618    PT Time Calculation (min)  33 min    Activity Tolerance  Patient tolerated treatment well    Behavior During Therapy  [facility] for tasks assessed/performed       Past Medical History:  Diagnosis Date  . Arthritis    knees  . Atrial fibrillation ([place])   . Cataracts, bilateral   . Diabetes mellitus   . GERD (gastroesophageal reflux disease)   . Hypertension   . Mitral regurgitation   . PONV (postoperative nausea and vomiting)   . S/P Maze operation for atrial fibrillation 10/11/2012   Complete bilateral atrial lesion set using bipolar radiofrequency and cryothermy ablation with clipping of LA appendage  . 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   63mm Sorin Memo 3D ring annuloplasty with 26 mm Edwards mc3 tricuspid ring annuloplasty   . Shortness of breath    with activity  . Stroke [facility])    mini stroke with vision problems  . Tricuspid regurgitation     Past Surgical History:  Procedure Laterality Date  . CARDIAC CATHETERIZATION  09-05-12  . CARDIOVERSION N/A 07/31/2012   Procedure: CARDIOVERSION;  Surgeon: Josue Hector, MD;  Location: [place];  Service: Cardiovascular;  Laterality: N/A;  . CESAREAN SECTION     x 2  . COLONOSCOPY WITH PROPOFOL N/A 11/28/2014   Procedure: COLONOSCOPY WITH PROPOFOL;  Surgeon: Carol Ada, MD;  Location: [facility] ENDOSCOPY;  Service: Endoscopy;  Laterality: N/A;  . FLEXIBLE  SIGMOIDOSCOPY N/A 12/18/2015   Procedure: FLEXIBLE SIGMOIDOSCOPY;  Surgeon: Carol Ada, MD;  Location: [facility] ENDOSCOPY;  Service: Endoscopy;  Laterality: N/A;  . INTRAOPERATIVE TRANSESOPHAGEAL ECHOCARDIOGRAM N/A 10/11/2012   Procedure: INTRAOPERATIVE TRANSESOPHAGEAL ECHOCARDIOGRAM;  Surgeon: Rexene Alberts, MD;  Location: [place];  Service: Open Heart Surgery;  Laterality: N/A;  . MASTECTOMY Left 1982  . MAZE N/A 10/11/2012   Procedure: MAZE;  Surgeon: Rexene Alberts, MD;  Location: [place];  Service: Open Heart Surgery;  Laterality: N/A;  . MITRAL VALVE REPAIR N/A 10/11/2012   Procedure: MITRAL VALVE REPAIR (MVR);  Surgeon: Rexene Alberts, MD;  Location: [place];  Service: Open Heart Surgery;  Laterality: N/A;  . RIGHT HEART CATH AND CORONARY ANGIOGRAPHY N/A 12/24/2018   Procedure: RIGHT HEART CATH AND CORONARY ANGIOGRAPHY;  Surgeon: [name], MD;  Location: [place] CV LAB;  Service: Cardiovascular;  Laterality: N/A;  . TEE WITHOUT CARDIOVERSION N/A 07/31/2012   Procedure: TRANSESOPHAGEAL ECHOCARDIOGRAM (TEE);  Surgeon: Josue Hector, MD;  Location: [facility] ENDOSCOPY;  Service: Cardiovascular;  Laterality: N/A;  . TRICUSPID VALVE REPLACEMENT N/A 10/11/2012   Procedure: TRICUSPID VALVE REPAIR;  Surgeon: Rexene Alberts, MD;  Location: [place];  Service: Open Heart Surgery;  Laterality: N/A;  . TUBAL LIGATION      There were no vitals filed for this visit.   Subjective Assessment -  01/07/19 1551    Subjective  pt is a 78 y.o Fwith CC of SOB that has been going on for several years and reports it is getting worse over the last 6 months it has worsened. pt reports have 1 fall in the last 6 months secondary to syncopal episode on 8/12.    Patient Stated Goals  to get heart better    Currently in Pain?  Yes    Pain Score  7     Pain Location  Knee    Pain Orientation  Right    Pain Descriptors / Indicators  Aching;Sore    Pain Type  Chronic pain    Pain Onset  More than a month ago    Pain  Frequency  Intermittent    Aggravating Factors   standing / walking    Pain Relieving Factors  sitting         [facility] PT Assessment - 01/07/19 1545      Assessment   Medical Diagnosis  Severe Aortic Stenosis    Referring Provider (PT)  Murvin Natal MD    Onset Date/Surgical Date  --   for a few years with worsening in the last 6 months   Hand Dominance  Right      Precautions   Precautions  None      Restrictions   Weight Bearing Restrictions  No      Balance Screen   Has the patient fallen in the past 6 months  Yes    How many times?  1    Has the patient had a decrease in activity level because of a fear of falling?   No    Is the patient reluctant to leave their home because of a fear of falling?   No      Home Environment   Living Environment  Private residence    Type of Home  Apartment    Home Access  Level entry    Home Layout  One level      Prior Function   Level of Independence  Independent with basic ADLs    Vocation  Retired      Associate Professor   Overall Cognitive Status  Within Functional Limits for tasks assessed      ROM / Strength   AROM / PROM / Strength  AROM;Strength      AROM   Overall AROM   Within functional limits for tasks performed      Strength   Overall Strength  Within functional limits for tasks performed    Overall Strength Comments  mild weakness in bil UE 4-/5    Right Hand Grip (lbs)  32    Left Hand Grip (lbs)  38      Ambulation/Gait   Ambulation/Gait  Yes    Gait Pattern  Decreased stride length;Step-to pattern;Ataxic;Antalgic;Trendelenburg;Decreased trunk rotation;Trunk flexed;Narrow base of support    Gait Comments  pt ambulated 98 ft in 1:38 requiring rest break lasting 1:10 O2 94% HR 86, pt resumed walking an additional 75 ft at 4:04 resting for 1 min O2 94% and HR 86, pt  finished test walking 71 ft        [facility] Pre-Surgical Assessment - 01/07/19 0001    5 Meter Walk Test- trial 1  8 sec    5 Meter Walk Test-  trial 2  8 sec.     5 Meter Walk Test- trial 3  8 sec.    5 meter walk test  average  8 sec    4 Stage Balance Test tolerated for:   10 sec.    4 Stage Balance Test Position  2    Comment  unable to perform    ADL/IADL Independent with:  Bathing;Dressing;Meal prep    ADL/IADL Needs Assistance with:  Finances;Yard work    ADL/IADL Therapist, sports Index  Midly frail    6 Minute Walk- Baseline  yes    BP (mmHg)  115/60    HR (bpm)  72    02 Sat (%RA)  98 %    Modified Borg Scale for Dyspnea  0.5- Very, very slight shortness of breath    Perceived Rate of Exertion (Borg)  6-    6 Minute Walk Post Test  yes    BP (mmHg)  130/64    HR (bpm)  86    02 Sat (%RA)  94 %    Modified Borg Scale for Dyspnea  2- Mild shortness of breath    Perceived Rate of Exertion (Borg)  12-    Aerobic Endurance Distance Walked  244    Endurance additional comments  pt is 84.21% limited compared to age related norm              Objective measurements completed on examination: See above findings.              PT Education - 01/07/19 1719    Education Details  avoid rushing to get to a chair when feeling fatigue to avoid potential mistepping or tripping which could be a fall risk    Person(s) Educated  Patient    Methods  Explanation;Verbal cues;Handout    Comprehension  Verbalized understanding;Verbal cues required                  Plan - 01/07/19 1718    Clinical Impression Statement  see assessment in note    Stability/Clinical Decision Making  Stable/Uncomplicated    Clinical Decision Making  Low    PT Frequency  One time visit    PT Next Visit Plan  Pre-TAVR evaluation    Consulted and Agree with Plan of Care  Patient        Clinical Impression Statement: Pt is a 78 yo F presenting to OP PT for evaluation prior to possible TAVR surgery due to severe aortic stenosis. Pt reports onset of SOB/ fatigue  approximately a few years ago that is worsening over the last 6 months.  Symptoms are limiting endurance, safety with falls/ syncopy. Pt presents with functional ROM and strength, poor balance and is moderate at high fall risk 4 stage balance test, limited walking speed and poor aerobic endurance per 6 minute walk test.pt ambulated 98 ft in 1:38 requiring rest break lasting 1:10 O2 94% HR 86, pt resumed walking an additional 75 ft at 4:04 resting for 1 min O2 94% and HR 86, pt  finished test walking 71 ft on room air.  Pt reported 2/10 shortness of breath on modified scale for dyspnea. Pt ambulated a total of 244 feet in 6 minute walk. SOB, antalgic/ ataxic gait pattern, and general fatigue  increased significantly with 6 minute walk test. Based on the Short Physical Performance Battery, patient has a frailty rating of 4/12 with </= 5/12 considered frail.    Patient demonstrated the following deficits and impairments:     Visit Diagnosis: Other abnormalities of gait and mobility     Problem List Patient Active Problem List   Diagnosis  Date Noted  . Severe aortic stenosis   . Aortic stenosis 04/25/2018  . 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 HTN (hypertension) 08/13/2013  . Insomnia 08/13/2013  . Carotid stenosis 08/13/2013  . S/P MVR (mitral valve repair) 05/27/2013  . S/P TVR (tricuspid valve repair) 05/27/2013  . Long term (current) use of anticoagulants 10/25/2012  . 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  . S/P Maze operation for atrial fibrillation 10/11/2012  . Tricuspid regurgitation   . Mitral regurgitation 07/03/2012  . Atrial fibrillation ([place]) 07/03/2012   Starr Lake PT, DPT, LAT, ATC  01/07/19  5:25 PM      [facility] [address] [place], [place], [address] Phone: [phone]   Fax:  [phone]  Name: KARSTYN KIE MRN: [id number] Date of Birth: [birth year]

## 2019-01-07 NOTE — Patient Instructions (Addendum)
Stop taking Xarelto after you take your dose on Thursday 10/22 Continue taking all other medications without change through the day before surgery.  Have nothing to eat or drink after midnight the night before surgery.  On the morning of surgery take only Prilosec with a sip of water.

## 2019-01-07 NOTE — Telephone Encounter (Signed)
Pt had surgical consult today where results were reviewed.

## 2019-01-08 DIAGNOSIS — E1121 Type 2 diabetes mellitus with diabetic nephropathy: Secondary | ICD-10-CM | POA: Diagnosis not present

## 2019-01-08 DIAGNOSIS — Z794 Long term (current) use of insulin: Secondary | ICD-10-CM | POA: Diagnosis not present

## 2019-01-10 ENCOUNTER — Other Ambulatory Visit: Payer: Self-pay

## 2019-01-10 DIAGNOSIS — I350 Nonrheumatic aortic (valve) stenosis: Secondary | ICD-10-CM

## 2019-01-17 NOTE — Pre-Procedure Instructions (Addendum)
Rachel Santos  01/17/2019      [id number] [place], [place] - [address] [place] [address] Phone: [phone] Fax: [phone]    Your procedure is scheduled on Oct. 27  Report to [facility] Entrance A at 7:00 A.M.  Call this number if you have problems the morning of surgery:  [phone]   Remember:  Do not eat or drink after midnight.      Take these medicines the morning of surgery with A SIP OF WATER :  prilosec        7 days prior to surgery STOP taking any Aspirin (unless otherwise instructed by your surgeon), Aleve, Naproxen, Ibuprofen, Motrin, Advil, Goody's, BC's, all herbal medications, fish oil, and all vitamins.                How to Manage Your Diabetes Before and After Surgery  Why is it important to control my blood sugar before and after surgery? . Improving blood sugar levels before and after surgery helps healing and can limit problems. . A way of improving blood sugar control is eating a healthy diet by: o  Eating less sugar and carbohydrates o  Increasing activity/exercise o  Talking with your doctor about reaching your blood sugar goals . High blood sugars (greater than 180 mg/dL) can raise your risk of infections and slow your recovery, so you will need to focus on controlling your diabetes during the weeks before surgery. . Make sure that the doctor who takes care of your diabetes knows about your planned surgery including the date and location.  How do I manage my blood sugar before surgery? . Check your blood sugar at least 4 times a day, starting 2 days before surgery, to make sure that the level is not too high or low. o Check your blood sugar the morning of your surgery when you wake up and every 2 hours until you get to the Short Stay unit. . If your blood sugar is less than 70 mg/dL, you will need to treat for low blood sugar: o Do not take insulin. o Treat a low blood sugar (less than 70 mg/dL)  with  cup of clear juice (cranberry or apple), 4 glucose tablets, OR glucose gel. Recheck blood sugar in 15 minutes after treatment (to make sure it is greater than 70 mg/dL). If your blood sugar is not greater than 70 mg/dL on recheck, call [phone] o  for further instructions. . Report your blood sugar to the short stay nurse when you get to Short Stay.  . If you are admitted to the hospital after surgery: o Your blood sugar will be checked by the staff and you will probably be given insulin after surgery (instead of oral diabetes medicines) to make sure you have good blood sugar levels. o The goal for blood sugar control after surgery is 80-180 mg/dL.       WHAT DO I DO ABOUT MY DIABETES MEDICATION?      . THE MORNING OF SURGERY, take ______16_______ units of ___levemir_______insulin.                              Other Instructions:           Stop xarelto after you take dose on Oct. 22      Do not wear jewelry, make-up or nail polish.  Do not wear lotions, powders, or  perfumes, or deodorant.  Do not shave 48 hours prior to surgery.  Men may shave face and neck.  Do not bring valuables to the hospital.  [facility] is not responsible for any belongings or valuables.  Contacts, dentures or bridgework may not be worn into surgery.  Leave your suitcase in the car.  After surgery it may be brought to your room.  For patients admitted to the hospital, discharge time will be determined by your treatment team.  Patients discharged the day of surgery will not be allowed to drive home.    Special instructions:   [place]- Preparing For Surgery  Before surgery, you can play an important role. Because skin is not sterile, your skin needs to be as free of germs as possible. You can reduce the number of germs on your skin by washing with CHG (chlorahexidine gluconate) Soap before surgery.  CHG is an antiseptic cleaner which kills germs and bonds with the skin to continue killing  germs even after washing.    Oral Hygiene is also important to reduce your risk of infection.  Remember - BRUSH YOUR TEETH THE MORNING OF SURGERY WITH YOUR REGULAR TOOTHPASTE  Please do not use if you have an allergy to CHG or antibacterial soaps. If your skin becomes reddened/irritated stop using the CHG.  Do not shave (including legs and underarms) for at least 48 hours prior to first CHG shower. It is OK to shave your face.  Please follow these instructions carefully.   1. Shower the NIGHT BEFORE SURGERY and the MORNING OF SURGERY with CHG.   2. If you chose to wash your hair, wash your hair first as usual with your normal shampoo.  3. After you shampoo, rinse your hair and body thoroughly to remove the shampoo.  4. Use CHG as you would any other liquid soap. You can apply CHG directly to the skin and wash gently with a scrungie or a clean washcloth.   5. Apply the CHG Soap to your body ONLY FROM THE NECK DOWN.  Do not use on open wounds or open sores. Avoid contact with your eyes, ears, mouth and genitals (private parts). Wash Face and genitals (private parts)  with your normal soap.  6. Wash thoroughly, paying special attention to the area where your surgery will be performed.  7. Thoroughly rinse your body with warm water from the neck down.  8. DO NOT shower/wash with your normal soap after using and rinsing off the CHG Soap.  9. Pat yourself dry with a CLEAN TOWEL.  10. Wear CLEAN PAJAMAS to bed the night before surgery, wear comfortable clothes the morning of surgery  11. Place CLEAN SHEETS on your bed the night of your first shower and DO NOT SLEEP WITH PETS.    Day of Surgery:  Do not apply any deodorants/lotions.  Please wear clean clothes to the hospital/surgery center.   Remember to brush your teeth WITH YOUR REGULAR TOOTHPASTE.    Please read over the following fact sheets that you were given. Coughing and Deep Breathing, MRSA Information and Surgical Site  Infection Prevention

## 2019-01-18 ENCOUNTER — Other Ambulatory Visit (HOSPITAL_COMMUNITY)
Admission: RE | Admit: 2019-01-18 | Discharge: 2019-01-18 | Disposition: A | Discharge status: Home / Self Care | Payer: Medicare Other | Source: Ambulatory Visit | Attending: Cardiovascular Disease | Admitting: Cardiovascular Disease

## 2019-01-18 ENCOUNTER — Encounter (HOSPITAL_COMMUNITY)
Admission: RE | Admit: 2019-01-18 | Discharge: 2019-01-18 | Disposition: A | Discharge status: Home / Self Care | Payer: Medicare Other | Source: Ambulatory Visit | Attending: Cardiovascular Disease | Admitting: Cardiovascular Disease

## 2019-01-18 ENCOUNTER — Other Ambulatory Visit: Payer: Self-pay

## 2019-01-18 ENCOUNTER — Encounter (HOSPITAL_COMMUNITY): Payer: Self-pay

## 2019-01-18 ENCOUNTER — Ambulatory Visit (HOSPITAL_COMMUNITY)
Admission: RE | Admit: 2019-01-18 | Discharge: 2019-01-18 | Disposition: A | Discharge status: Home / Self Care | Payer: Medicare Other | Source: Ambulatory Visit | Attending: Cardiovascular Disease | Admitting: Cardiovascular Disease

## 2019-01-18 DIAGNOSIS — Z01812 Encounter for preprocedural laboratory examination: Secondary | ICD-10-CM | POA: Insufficient documentation

## 2019-01-18 DIAGNOSIS — Z20828 Contact with and (suspected) exposure to other viral communicable diseases: Secondary | ICD-10-CM | POA: Insufficient documentation

## 2019-01-18 DIAGNOSIS — I350 Nonrheumatic aortic (valve) stenosis: Secondary | ICD-10-CM

## 2019-01-18 DIAGNOSIS — Z952 Presence of prosthetic heart valve: Secondary | ICD-10-CM | POA: Insufficient documentation

## 2019-01-18 HISTORY — DX: Anxiety disorder, unspecified: F41.9

## 2019-01-18 LAB — COMPREHENSIVE METABOLIC PANEL
ALT: 21 U/L (ref 0–44)
AST: 24 U/L (ref 15–41)
Albumin: 4 g/dL (ref 3.5–5.0)
Alkaline Phosphatase: 58 U/L (ref 38–126)
Anion gap: 12 (ref 5–15)
BUN: 31 mg/dL — ABNORMAL HIGH (ref 8–23)
CO2: 18 mmol/L — ABNORMAL LOW (ref 22–32)
Calcium: 9.4 mg/dL (ref 8.9–10.3)
Chloride: 104 mmol/L (ref 98–111)
Creatinine, Ser: 1.14 mg/dL — ABNORMAL HIGH (ref 0.44–1.00)
GFR calc Af Amer: 53 mL/min — ABNORMAL LOW (ref >60)
GFR calc non Af Amer: 46 mL/min — ABNORMAL LOW (ref >60)
Glucose, Bld: 160 mg/dL — ABNORMAL HIGH (ref 70–99)
Potassium: 4.2 mmol/L (ref 3.5–5.1)
Sodium: 134 mmol/L — ABNORMAL LOW (ref 135–145)
Total Bilirubin: 0.6 mg/dL (ref 0.3–1.2)
Total Protein: 6.6 g/dL (ref 6.5–8.1)

## 2019-01-18 LAB — BLOOD GAS, ARTERIAL
Acid-base deficit: 2.8 mmol/L — ABNORMAL HIGH (ref 0.0–2.0)
Bicarbonate: 20.3 mmol/L (ref 20.0–28.0)
Drawn by: 421801
FIO2: 21
O2 Saturation: 98.6 %
Patient temperature: 98.6
pCO2 arterial: 28.5 mmHg — ABNORMAL LOW (ref 32.0–48.0)
pH, Arterial: 7.467 — ABNORMAL HIGH (ref 7.350–7.450)
pO2, Arterial: 112 mmHg — ABNORMAL HIGH (ref 83.0–108.0)

## 2019-01-18 LAB — BRAIN NATRIURETIC PEPTIDE: B Natriuretic Peptide: 67.9 pg/mL (ref 0.0–100.0)

## 2019-01-18 LAB — GLUCOSE, CAPILLARY: Glucose-Capillary: 164 mg/dL — ABNORMAL HIGH (ref 70–99)

## 2019-01-18 LAB — HEMOGLOBIN A1C
Hgb A1c MFr Bld: 7 % — ABNORMAL HIGH (ref 4.8–5.6)
Mean Plasma Glucose: 154.2 mg/dL

## 2019-01-18 LAB — CBC
HCT: 39.9 % (ref 36.0–46.0)
Hemoglobin: 13.1 g/dL (ref 12.0–15.0)
MCH: 30.1 pg (ref 26.0–34.0)
MCHC: 32.8 g/dL (ref 30.0–36.0)
MCV: 91.7 fL (ref 80.0–100.0)
Platelets: 247 10*3/uL (ref 150–400)
RBC: 4.35 MIL/uL (ref 3.87–5.11)
RDW: 12.8 % (ref 11.5–15.5)
WBC: 8.3 10*3/uL (ref 4.0–10.5)
nRBC: 0 % (ref 0.0–0.2)

## 2019-01-18 LAB — APTT: aPTT: 42 seconds — ABNORMAL HIGH (ref 24–36)

## 2019-01-18 LAB — SURGICAL PCR SCREEN
MRSA, PCR: NEGATIVE
Staphylococcus aureus: POSITIVE — AB

## 2019-01-18 NOTE — Progress Notes (Signed)
Spoke to patient and [facility] in [facility]  Patient states she will pick up today.  Instructions given.

## 2019-01-18 NOTE — Progress Notes (Signed)
PCP:  Merrilee Seashore MD Cardiologist:  DR. McAlhany  EKG:  01/18/19 CXR:  01/18/19 ECHO:  11/23/18 Stress Test:  denies Cardiac Cath:  12/24/18  Fasting Blood Sugar-  60-200 Checks Blood Sugar__2-3_ times a day  Covid testing today, 01/18/19  Blood Thinners:  Xaralto last dose 01/17/19  Patient denies shortness of breath, fever, cough, and chest pain at PAT appointment.  Patient verbalized understanding of instructions provided today at the PAT appointment.  Patient asked to review instructions at home and day of surgery.

## 2019-01-19 ENCOUNTER — Telehealth: Payer: Self-pay | Admitting: Medical

## 2019-01-19 LAB — NOVEL CORONAVIRUS, NAA (HOSP ORDER, SEND-OUT TO REF LAB; TAT 18-24 HRS): SARS-CoV-2, NAA: NOT DETECTED

## 2019-01-19 NOTE — Telephone Encounter (Signed)
   Patient's daughter, [name], called the after hours line with complaints that her mother was more sluggish today. After talking with her mother more, it appears she had not eaten breakfast this morning. She did not check her blood sugar or blood pressure at that time. After eating this afternoon her blood sugar was 240 and her symptoms improved, however she is still feeling quite fatigued. Otherwise no complaints of chest pain, SOB, dizziness, lightheadedness, or syncope. She is planned for TAVR 01/22/2019. Do not see any indication for early admission at this point but if symptoms worsen or she develops chest pain or syncope, she should present to [facility] for further evaluation. Daughter was appreciative of the call and in agreement with the plan.  Abigail Butts, PA-C  01/19/19; 3:57 PM

## 2019-01-21 MED ORDER — MAGNESIUM SULFATE 50 % IJ SOLN
40.0000 meq | INTRAMUSCULAR | Status: DC
  Filled 2019-01-21: qty 9.85

## 2019-01-21 MED ORDER — VANCOMYCIN HCL 10 G IV SOLR
1250.0000 mg | INTRAVENOUS | Status: AC
  Administered 2019-01-22: 1250 mg via INTRAVENOUS
  Filled 2019-01-21: qty 1250

## 2019-01-21 MED ORDER — SODIUM CHLORIDE 0.9 % IV SOLN
INTRAVENOUS | Status: DC
  Filled 2019-01-21: qty 30

## 2019-01-21 MED ORDER — NOREPINEPHRINE 4 MG/250ML-% IV SOLN
0.0000 ug/min | INTRAVENOUS | Status: AC
  Administered 2019-01-22: 2 ug/min via INTRAVENOUS
  Filled 2019-01-21: qty 250

## 2019-01-21 MED ORDER — POTASSIUM CHLORIDE 2 MEQ/ML IV SOLN
80.0000 meq | INTRAVENOUS | Status: DC
  Filled 2019-01-21: qty 40

## 2019-01-21 MED ORDER — DEXMEDETOMIDINE HCL IN NACL 400 MCG/100ML IV SOLN
0.1000 ug/kg/h | INTRAVENOUS | Status: AC
  Administered 2019-01-22: 1 ug/kg/h via INTRAVENOUS
  Filled 2019-01-21 (×2): qty 100

## 2019-01-21 MED ORDER — SODIUM CHLORIDE 0.9 % IV SOLN
1.5000 g | INTRAVENOUS | Status: AC
  Administered 2019-01-22: 1.5 g via INTRAVENOUS
  Filled 2019-01-21: qty 1.5

## 2019-01-22 ENCOUNTER — Encounter (HOSPITAL_COMMUNITY): Payer: Self-pay

## 2019-01-22 ENCOUNTER — Other Ambulatory Visit: Payer: Self-pay | Admitting: Physician Assistant

## 2019-01-22 ENCOUNTER — Inpatient Hospital Stay (HOSPITAL_COMMUNITY): Payer: Medicare Other | Admitting: Registered Nurse

## 2019-01-22 ENCOUNTER — Inpatient Hospital Stay (HOSPITAL_COMMUNITY)
Admission: RE | Admit: 2019-01-22 | Discharge: 2019-01-28 | DRG: 266 | Disposition: A | Discharge status: Home Health | Payer: Medicare Other | Attending: Cardiovascular Disease | Admitting: Cardiovascular Disease

## 2019-01-22 ENCOUNTER — Encounter (HOSPITAL_COMMUNITY)
Admission: RE | Disposition: A | Discharge status: Home Health | Payer: Self-pay | Source: Home / Self Care | Attending: Cardiovascular Disease

## 2019-01-22 ENCOUNTER — Other Ambulatory Visit: Payer: Self-pay

## 2019-01-22 ENCOUNTER — Inpatient Hospital Stay (HOSPITAL_COMMUNITY): Payer: Medicare Other | Admitting: Physician Assistant

## 2019-01-22 ENCOUNTER — Inpatient Hospital Stay (HOSPITAL_COMMUNITY)
Admission: RE | Admit: 2019-01-22 | Discharge: 2019-01-22 | Disposition: A | Discharge status: Home / Self Care | Payer: Medicare Other | Source: Home / Self Care | Attending: Cardiovascular Disease | Admitting: Cardiovascular Disease

## 2019-01-22 ENCOUNTER — Encounter (HOSPITAL_COMMUNITY): Payer: Self-pay | Admitting: Physician Assistant

## 2019-01-22 ENCOUNTER — Inpatient Hospital Stay (HOSPITAL_COMMUNITY): Payer: Medicare Other

## 2019-01-22 DIAGNOSIS — E1122 Type 2 diabetes mellitus with diabetic chronic kidney disease: Secondary | ICD-10-CM | POA: Diagnosis present

## 2019-01-22 DIAGNOSIS — L7632 Postprocedural hematoma of skin and subcutaneous tissue following other procedure: Secondary | ICD-10-CM | POA: Diagnosis not present

## 2019-01-22 DIAGNOSIS — I639 Cerebral infarction, unspecified: Secondary | ICD-10-CM | POA: Diagnosis not present

## 2019-01-22 DIAGNOSIS — E118 Type 2 diabetes mellitus with unspecified complications: Secondary | ICD-10-CM | POA: Diagnosis not present

## 2019-01-22 DIAGNOSIS — D62 Acute posthemorrhagic anemia: Secondary | ICD-10-CM | POA: Diagnosis not present

## 2019-01-22 DIAGNOSIS — I350 Nonrheumatic aortic (valve) stenosis: Secondary | ICD-10-CM

## 2019-01-22 DIAGNOSIS — I724 Aneurysm of artery of lower extremity: Secondary | ICD-10-CM | POA: Diagnosis not present

## 2019-01-22 DIAGNOSIS — I480 Paroxysmal atrial fibrillation: Secondary | ICD-10-CM | POA: Diagnosis not present

## 2019-01-22 DIAGNOSIS — E119 Type 2 diabetes mellitus without complications: Secondary | ICD-10-CM | POA: Diagnosis not present

## 2019-01-22 DIAGNOSIS — I63442 Cerebral infarction due to embolism of left cerebellar artery: Secondary | ICD-10-CM | POA: Diagnosis not present

## 2019-01-22 DIAGNOSIS — I2510 Atherosclerotic heart disease of native coronary artery without angina pectoris: Secondary | ICD-10-CM | POA: Diagnosis present

## 2019-01-22 DIAGNOSIS — E785 Hyperlipidemia, unspecified: Secondary | ICD-10-CM | POA: Diagnosis not present

## 2019-01-22 DIAGNOSIS — Z8673 Personal history of transient ischemic attack (TIA), and cerebral infarction without residual deficits: Secondary | ICD-10-CM | POA: Diagnosis not present

## 2019-01-22 DIAGNOSIS — N182 Chronic kidney disease, stage 2 (mild): Secondary | ICD-10-CM | POA: Diagnosis present

## 2019-01-22 DIAGNOSIS — Z8679 Personal history of other diseases of the circulatory system: Secondary | ICD-10-CM

## 2019-01-22 DIAGNOSIS — Z952 Presence of prosthetic heart valve: Secondary | ICD-10-CM

## 2019-01-22 DIAGNOSIS — Z7901 Long term (current) use of anticoagulants: Secondary | ICD-10-CM

## 2019-01-22 DIAGNOSIS — R29704 NIHSS score 4: Secondary | ICD-10-CM | POA: Diagnosis not present

## 2019-01-22 DIAGNOSIS — R509 Fever, unspecified: Secondary | ICD-10-CM | POA: Diagnosis not present

## 2019-01-22 DIAGNOSIS — K219 Gastro-esophageal reflux disease without esophagitis: Secondary | ICD-10-CM | POA: Diagnosis not present

## 2019-01-22 DIAGNOSIS — R29810 Facial weakness: Secondary | ICD-10-CM | POA: Diagnosis not present

## 2019-01-22 DIAGNOSIS — R29818 Other symptoms and signs involving the nervous system: Secondary | ICD-10-CM | POA: Diagnosis not present

## 2019-01-22 DIAGNOSIS — S301XXA Contusion of abdominal wall, initial encounter: Secondary | ICD-10-CM | POA: Diagnosis not present

## 2019-01-22 DIAGNOSIS — R4701 Aphasia: Secondary | ICD-10-CM | POA: Diagnosis not present

## 2019-01-22 DIAGNOSIS — T81718A Complication of other artery following a procedure, not elsewhere classified, initial encounter: Secondary | ICD-10-CM | POA: Diagnosis not present

## 2019-01-22 DIAGNOSIS — I672 Cerebral atherosclerosis: Secondary | ICD-10-CM | POA: Diagnosis not present

## 2019-01-22 DIAGNOSIS — Y92239 Unspecified place in hospital as the place of occurrence of the external cause: Secondary | ICD-10-CM | POA: Diagnosis not present

## 2019-01-22 DIAGNOSIS — Z8042 Family history of malignant neoplasm of prostate: Secondary | ICD-10-CM

## 2019-01-22 DIAGNOSIS — Z8249 Family history of ischemic heart disease and other diseases of the circulatory system: Secondary | ICD-10-CM | POA: Diagnosis not present

## 2019-01-22 DIAGNOSIS — Z9889 Other specified postprocedural states: Secondary | ICD-10-CM | POA: Diagnosis not present

## 2019-01-22 DIAGNOSIS — Y831 Surgical operation with implant of artificial internal device as the cause of abnormal reaction of the patient, or of later complication, without mention of misadventure at the time of the procedure: Secondary | ICD-10-CM | POA: Diagnosis not present

## 2019-01-22 DIAGNOSIS — Z006 Encounter for examination for normal comparison and control in clinical research program: Secondary | ICD-10-CM

## 2019-01-22 DIAGNOSIS — I97638 Postprocedural hematoma of a circulatory system organ or structure following other circulatory system procedure: Secondary | ICD-10-CM | POA: Diagnosis not present

## 2019-01-22 DIAGNOSIS — Z79899 Other long term (current) drug therapy: Secondary | ICD-10-CM

## 2019-01-22 DIAGNOSIS — I729 Aneurysm of unspecified site: Secondary | ICD-10-CM | POA: Diagnosis not present

## 2019-01-22 DIAGNOSIS — I10 Essential (primary) hypertension: Secondary | ICD-10-CM | POA: Diagnosis not present

## 2019-01-22 DIAGNOSIS — K5900 Constipation, unspecified: Secondary | ICD-10-CM | POA: Diagnosis not present

## 2019-01-22 DIAGNOSIS — Z794 Long term (current) use of insulin: Secondary | ICD-10-CM | POA: Diagnosis not present

## 2019-01-22 DIAGNOSIS — I129 Hypertensive chronic kidney disease with stage 1 through stage 4 chronic kidney disease, or unspecified chronic kidney disease: Secondary | ICD-10-CM | POA: Diagnosis present

## 2019-01-22 DIAGNOSIS — R4781 Slurred speech: Secondary | ICD-10-CM | POA: Diagnosis not present

## 2019-01-22 DIAGNOSIS — G459 Transient cerebral ischemic attack, unspecified: Secondary | ICD-10-CM | POA: Diagnosis not present

## 2019-01-22 DIAGNOSIS — R531 Weakness: Secondary | ICD-10-CM | POA: Diagnosis not present

## 2019-01-22 DIAGNOSIS — S301XXD Contusion of abdominal wall, subsequent encounter: Secondary | ICD-10-CM | POA: Diagnosis not present

## 2019-01-22 DIAGNOSIS — I272 Pulmonary hypertension, unspecified: Secondary | ICD-10-CM | POA: Diagnosis not present

## 2019-01-22 HISTORY — PX: FEMORAL ARTERY EXPLORATION: SHX5160

## 2019-01-22 HISTORY — PX: TRANSCATHETER AORTIC VALVE REPLACEMENT, TRANSFEMORAL: SHX6400

## 2019-01-22 HISTORY — DX: Personal history of transient ischemic attack (TIA), and cerebral infarction without residual deficits: Z86.73

## 2019-01-22 HISTORY — PX: TEE WITHOUT CARDIOVERSION: SHX5443

## 2019-01-22 HISTORY — DX: Presence of prosthetic heart valve: Z95.2

## 2019-01-22 HISTORY — PX: ARTERY REPAIR: SHX559

## 2019-01-22 LAB — POCT I-STAT, CHEM 8
BUN: 22 mg/dL (ref 8–23)
BUN: 21 mg/dL (ref 8–23)
BUN: 18 mg/dL (ref 8–23)
BUN: 20 mg/dL (ref 8–23)
Calcium, Ion: 1.3 mmol/L (ref 1.15–1.40)
Calcium, Ion: 1.31 mmol/L (ref 1.15–1.40)
Calcium, Ion: 1.2 mmol/L (ref 1.15–1.40)
Calcium, Ion: 1.29 mmol/L (ref 1.15–1.40)
Chloride: 102 mmol/L (ref 98–111)
Chloride: 102 mmol/L (ref 98–111)
Chloride: 103 mmol/L (ref 98–111)
Chloride: 108 mmol/L (ref 98–111)
Creatinine, Ser: 0.9 mg/dL (ref 0.44–1.00)
Creatinine, Ser: 0.7 mg/dL (ref 0.44–1.00)
Creatinine, Ser: 0.5 mg/dL (ref 0.44–1.00)
Creatinine, Ser: 0.7 mg/dL (ref 0.44–1.00)
Glucose, Bld: 130 mg/dL — ABNORMAL HIGH (ref 70–99)
Glucose, Bld: 133 mg/dL — ABNORMAL HIGH (ref 70–99)
Glucose, Bld: 133 mg/dL — ABNORMAL HIGH (ref 70–99)
Glucose, Bld: 152 mg/dL — ABNORMAL HIGH (ref 70–99)
HCT: 36 % (ref 36.0–46.0)
HCT: 31 % — ABNORMAL LOW (ref 36.0–46.0)
HCT: 26 % — ABNORMAL LOW (ref 36.0–46.0)
HCT: 30 % — ABNORMAL LOW (ref 36.0–46.0)
Hemoglobin: 12.2 g/dL (ref 12.0–15.0)
Hemoglobin: 10.5 g/dL — ABNORMAL LOW (ref 12.0–15.0)
Hemoglobin: 10.2 g/dL — ABNORMAL LOW (ref 12.0–15.0)
Hemoglobin: 8.8 g/dL — ABNORMAL LOW (ref 12.0–15.0)
Potassium: 4.3 mmol/L (ref 3.5–5.1)
Potassium: 4.2 mmol/L (ref 3.5–5.1)
Potassium: 3.6 mmol/L (ref 3.5–5.1)
Potassium: 4.4 mmol/L (ref 3.5–5.1)
Sodium: 138 mmol/L (ref 135–145)
Sodium: 137 mmol/L (ref 135–145)
Sodium: 138 mmol/L (ref 135–145)
Sodium: 142 mmol/L (ref 135–145)
TCO2: 25 mmol/L (ref 22–32)
TCO2: 24 mmol/L (ref 22–32)
TCO2: 20 mmol/L — ABNORMAL LOW (ref 22–32)
TCO2: 24 mmol/L (ref 22–32)

## 2019-01-22 LAB — URINALYSIS, ROUTINE W REFLEX MICROSCOPIC
Bilirubin Urine: NEGATIVE
Glucose, UA: NEGATIVE mg/dL
Hgb urine dipstick: NEGATIVE
Ketones, ur: NEGATIVE mg/dL
Nitrite: NEGATIVE
Protein, ur: NEGATIVE mg/dL
Specific Gravity, Urine: 1.01 (ref 1.005–1.030)
pH: 6 (ref 5.0–8.0)

## 2019-01-22 LAB — URINALYSIS, MICROSCOPIC (REFLEX)

## 2019-01-22 LAB — PROTIME-INR
INR: 1.1 (ref 0.8–1.2)
Prothrombin Time: 13.6 seconds (ref 11.4–15.2)

## 2019-01-22 LAB — GLUCOSE, CAPILLARY
Glucose-Capillary: 128 mg/dL — ABNORMAL HIGH (ref 70–99)
Glucose-Capillary: 150 mg/dL — ABNORMAL HIGH (ref 70–99)
Glucose-Capillary: 205 mg/dL — ABNORMAL HIGH (ref 70–99)
Glucose-Capillary: 203 mg/dL — ABNORMAL HIGH (ref 70–99)
Glucose-Capillary: 155 mg/dL — ABNORMAL HIGH (ref 70–99)
Glucose-Capillary: 138 mg/dL — ABNORMAL HIGH (ref 70–99)

## 2019-01-22 LAB — HEMOGLOBIN AND HEMATOCRIT, BLOOD
HCT: 31.7 % — ABNORMAL LOW (ref 36.0–46.0)
Hemoglobin: 10.4 g/dL — ABNORMAL LOW (ref 12.0–15.0)

## 2019-01-22 LAB — POCT ACTIVATED CLOTTING TIME
Activated Clotting Time: 121 seconds
Activated Clotting Time: 241 seconds
Activated Clotting Time: 92 seconds

## 2019-01-22 LAB — PREPARE RBC (CROSSMATCH)

## 2019-01-22 SURGERY — IMPLANTATION, AORTIC VALVE, TRANSCATHETER, FEMORAL APPROACH
Anesthesia: General

## 2019-01-22 SURGERY — EXPLORATION, ARTERY, FEMORAL
Anesthesia: General | Laterality: Right

## 2019-01-22 MED ORDER — IOHEXOL 350 MG/ML SOLN
INTRAVENOUS | Status: DC | PRN
  Administered 2019-01-22: 60 mL

## 2019-01-22 MED ORDER — ONDANSETRON HCL 4 MG/2ML IJ SOLN: 4.0000 mg | Freq: Four times a day (QID) | INTRAMUSCULAR | Status: DC | PRN

## 2019-01-22 MED ORDER — HEPARIN (PORCINE) IN NACL 1000-0.9 UT/500ML-% IV SOLN
INTRAVENOUS | Status: DC | PRN
  Administered 2019-01-22 (×3): 500 mL

## 2019-01-22 MED ORDER — CHLORHEXIDINE GLUCONATE 4 % EX LIQD: 30.0000 mL | CUTANEOUS | Status: DC

## 2019-01-22 MED ORDER — PROTAMINE SULFATE 10 MG/ML IV SOLN
INTRAVENOUS | Status: DC | PRN
  Administered 2019-01-22: 20 mg via INTRAVENOUS
  Administered 2019-01-22 (×2): 10 mg via INTRAVENOUS

## 2019-01-22 MED ORDER — PHENYLEPHRINE HCL-NACL 20-0.9 MG/250ML-% IV SOLN
0.0000 ug/min | INTRAVENOUS | Status: DC
  Filled 2019-01-22: qty 250

## 2019-01-22 MED ORDER — PHENYLEPHRINE 40 MCG/ML (10ML) SYRINGE FOR IV PUSH (FOR BLOOD PRESSURE SUPPORT)
PREFILLED_SYRINGE | INTRAVENOUS | Status: DC | PRN
  Administered 2019-01-22 (×3): 40 ug via INTRAVENOUS

## 2019-01-22 MED ORDER — CHLORHEXIDINE GLUCONATE 4 % EX LIQD: 60.0000 mL | Freq: Once | CUTANEOUS | Status: DC

## 2019-01-22 MED ORDER — SUCCINYLCHOLINE CHLORIDE 200 MG/10ML IV SOSY
PREFILLED_SYRINGE | INTRAVENOUS | Status: DC | PRN
  Administered 2019-01-22: 100 mg via INTRAVENOUS

## 2019-01-22 MED ORDER — VENLAFAXINE HCL ER 75 MG PO CP24
150.0000 mg | ORAL_CAPSULE | Freq: Every day | ORAL | Status: DC
  Administered 2019-01-23 – 2019-01-28 (×6): 150 mg via ORAL
  Filled 2019-01-22: qty 1
  Filled 2019-01-22: qty 2
  Filled 2019-01-22 (×2): qty 1
  Filled 2019-01-22 (×2): qty 2

## 2019-01-22 MED ORDER — 0.9 % SODIUM CHLORIDE (POUR BTL) OPTIME
TOPICAL | Status: DC | PRN
  Administered 2019-01-22: 20:00:00 2000 mL

## 2019-01-22 MED ORDER — INSULIN ASPART 100 UNIT/ML ~~LOC~~ SOLN
0.0000 [IU] | Freq: Three times a day (TID) | SUBCUTANEOUS | Status: DC
  Administered 2019-01-22: 8 [IU] via SUBCUTANEOUS
  Administered 2019-01-23 (×3): 4 [IU] via SUBCUTANEOUS
  Administered 2019-01-23: 8 [IU] via SUBCUTANEOUS
  Administered 2019-01-24: 4 [IU] via SUBCUTANEOUS
  Administered 2019-01-24: 2 [IU] via SUBCUTANEOUS
  Administered 2019-01-24: 4 [IU] via SUBCUTANEOUS
  Administered 2019-01-24: 2 [IU] via SUBCUTANEOUS
  Administered 2019-01-25 (×2): 4 [IU] via SUBCUTANEOUS
  Administered 2019-01-25: 8 [IU] via SUBCUTANEOUS
  Administered 2019-01-25: 2 [IU] via SUBCUTANEOUS
  Administered 2019-01-26: 8 [IU] via SUBCUTANEOUS
  Administered 2019-01-26: 4 [IU] via SUBCUTANEOUS
  Administered 2019-01-26: 8 [IU] via SUBCUTANEOUS
  Administered 2019-01-26: 2 [IU] via SUBCUTANEOUS
  Administered 2019-01-27: 12 [IU] via SUBCUTANEOUS
  Administered 2019-01-27: 2 [IU] via SUBCUTANEOUS
  Administered 2019-01-27: 4 [IU] via SUBCUTANEOUS
  Administered 2019-01-28: 2 [IU] via SUBCUTANEOUS

## 2019-01-22 MED ORDER — ASPIRIN 81 MG PO CHEW
81.0000 mg | CHEWABLE_TABLET | Freq: Every day | ORAL | Status: DC
  Administered 2019-01-23 – 2019-01-24 (×2): 81 mg via ORAL
  Filled 2019-01-22 (×3): qty 1

## 2019-01-22 MED ORDER — ROCURONIUM BROMIDE 10 MG/ML (PF) SYRINGE
PREFILLED_SYRINGE | INTRAVENOUS | Status: DC | PRN
  Administered 2019-01-22: 40 mg via INTRAVENOUS

## 2019-01-22 MED ORDER — MELATONIN 5 MG PO CAPS: 5.0000 mg | ORAL_CAPSULE | Freq: Every evening | ORAL | Status: DC | PRN

## 2019-01-22 MED ORDER — PROPOFOL 10 MG/ML IV BOLUS
INTRAVENOUS | Status: AC
  Filled 2019-01-22: qty 20

## 2019-01-22 MED ORDER — LACTATED RINGERS IV SOLN
INTRAVENOUS | Status: DC | PRN
  Administered 2019-01-22: 20:00:00 via INTRAVENOUS

## 2019-01-22 MED ORDER — HEPARIN SODIUM (PORCINE) 1000 UNIT/ML IJ SOLN
INTRAMUSCULAR | Status: DC | PRN
  Administered 2019-01-22: 6000 [IU] via INTRAVENOUS

## 2019-01-22 MED ORDER — LACTATED RINGERS IV SOLN
INTRAVENOUS | Status: DC | PRN
  Administered 2019-01-22 (×2): via INTRAVENOUS

## 2019-01-22 MED ORDER — GLYCOPYRROLATE PF 0.2 MG/ML IJ SOSY
PREFILLED_SYRINGE | INTRAMUSCULAR | Status: DC | PRN
  Administered 2019-01-22: .1 mg via INTRAVENOUS

## 2019-01-22 MED ORDER — SODIUM CHLORIDE 0.9 % IV SOLN
INTRAVENOUS | Status: DC | PRN
  Administered 2019-01-22: 25 ug/min via INTRAVENOUS

## 2019-01-22 MED ORDER — THROMBIN (RECOMBINANT) 20000 UNITS EX SOLR
CUTANEOUS | Status: AC
  Filled 2019-01-22: qty 20000

## 2019-01-22 MED ORDER — PRAVASTATIN SODIUM 40 MG PO TABS
40.0000 mg | ORAL_TABLET | Freq: Every day | ORAL | Status: DC
  Administered 2019-01-23 – 2019-01-27 (×5): 40 mg via ORAL
  Filled 2019-01-22 (×5): qty 1

## 2019-01-22 MED ORDER — FENTANYL CITRATE (PF) 100 MCG/2ML IJ SOLN: 25.0000 ug | INTRAMUSCULAR | Status: DC | PRN

## 2019-01-22 MED ORDER — CHLORHEXIDINE GLUCONATE CLOTH 2 % EX PADS
6.0000 | MEDICATED_PAD | Freq: Every day | CUTANEOUS | Status: DC
  Administered 2019-01-23 – 2019-01-27 (×5): 6 via TOPICAL

## 2019-01-22 MED ORDER — LIDOCAINE 2% (20 MG/ML) 5 ML SYRINGE
INTRAMUSCULAR | Status: DC | PRN
  Administered 2019-01-22: 40 mg via INTRAVENOUS

## 2019-01-22 MED ORDER — DEXAMETHASONE SODIUM PHOSPHATE 10 MG/ML IJ SOLN
INTRAMUSCULAR | Status: DC | PRN
  Administered 2019-01-22: 5 mg via INTRAVENOUS

## 2019-01-22 MED ORDER — VENLAFAXINE HCL ER 75 MG PO CP24
75.0000 mg | ORAL_CAPSULE | Freq: Every day | ORAL | Status: DC
  Administered 2019-01-23 – 2019-01-28 (×6): 75 mg via ORAL
  Filled 2019-01-22 (×7): qty 1

## 2019-01-22 MED ORDER — FENTANYL CITRATE (PF) 250 MCG/5ML IJ SOLN
INTRAMUSCULAR | Status: AC
  Filled 2019-01-22: qty 5

## 2019-01-22 MED ORDER — NITROGLYCERIN IN D5W 200-5 MCG/ML-% IV SOLN: 0.0000 ug/min | INTRAVENOUS | Status: DC

## 2019-01-22 MED ORDER — SUGAMMADEX SODIUM 200 MG/2ML IV SOLN
INTRAVENOUS | Status: DC | PRN
  Administered 2019-01-22 (×3): 50 mg via INTRAVENOUS

## 2019-01-22 MED ORDER — ONDANSETRON HCL 4 MG/2ML IJ SOLN
INTRAMUSCULAR | Status: DC | PRN
  Administered 2019-01-22: 4 mg via INTRAVENOUS

## 2019-01-22 MED ORDER — SODIUM CHLORIDE 0.9 % IV SOLN
250.0000 mL | INTRAVENOUS | Status: DC | PRN
  Administered 2019-01-22: 250 mL via INTRAVENOUS

## 2019-01-22 MED ORDER — HEPARIN (PORCINE) IN NACL 1000-0.9 UT/500ML-% IV SOLN
INTRAVENOUS | Status: AC
  Filled 2019-01-22: qty 1500

## 2019-01-22 MED ORDER — MORPHINE SULFATE (PF) 2 MG/ML IV SOLN
1.0000 mg | INTRAVENOUS | Status: DC | PRN
  Administered 2019-01-22: 2 mg via INTRAVENOUS
  Administered 2019-01-22 (×3): 1 mg via INTRAVENOUS
  Filled 2019-01-22 (×3): qty 1

## 2019-01-22 MED ORDER — PANTOPRAZOLE SODIUM 40 MG PO TBEC
40.0000 mg | DELAYED_RELEASE_TABLET | Freq: Every day | ORAL | Status: DC
  Administered 2019-01-23 – 2019-01-28 (×6): 40 mg via ORAL
  Filled 2019-01-22 (×6): qty 1

## 2019-01-22 MED ORDER — PHENYLEPHRINE HCL-NACL 10-0.9 MG/250ML-% IV SOLN
INTRAVENOUS | Status: DC | PRN
  Administered 2019-01-22: 15 ug/min via INTRAVENOUS

## 2019-01-22 MED ORDER — PHENYLEPHRINE HCL (PRESSORS) 10 MG/ML IV SOLN: INTRAVENOUS | Status: DC | PRN

## 2019-01-22 MED ORDER — PROPOFOL 500 MG/50ML IV EMUL
INTRAVENOUS | Status: DC | PRN
  Administered 2019-01-22: 15 ug/kg/min via INTRAVENOUS

## 2019-01-22 MED ORDER — VANCOMYCIN HCL IN DEXTROSE 1-5 GM/200ML-% IV SOLN
1000.0000 mg | Freq: Once | INTRAVENOUS | Status: AC
  Administered 2019-01-22: 1000 mg via INTRAVENOUS
  Filled 2019-01-22 (×2): qty 200

## 2019-01-22 MED ORDER — HEPARIN SODIUM (PORCINE) 1000 UNIT/ML IJ SOLN
INTRAMUSCULAR | Status: DC | PRN
  Administered 2019-01-22: 2000 [IU] via INTRAVENOUS
  Administered 2019-01-22: 10000 [IU] via INTRAVENOUS

## 2019-01-22 MED ORDER — FENTANYL CITRATE (PF) 250 MCG/5ML IJ SOLN
INTRAMUSCULAR | Status: DC | PRN
  Administered 2019-01-22 (×2): 25 ug via INTRAVENOUS

## 2019-01-22 MED ORDER — SODIUM CHLORIDE 0.9 % IV SOLN
INTRAVENOUS | Status: DC | PRN
  Administered 2019-01-22: 500 mL

## 2019-01-22 MED ORDER — FENTANYL CITRATE (PF) 250 MCG/5ML IJ SOLN
INTRAMUSCULAR | Status: DC | PRN
  Administered 2019-01-22 (×4): 25 ug via INTRAVENOUS

## 2019-01-22 MED ORDER — SODIUM CHLORIDE 0.9% FLUSH
3.0000 mL | Freq: Two times a day (BID) | INTRAVENOUS | Status: DC
  Administered 2019-01-23 – 2019-01-27 (×7): 3 mL via INTRAVENOUS

## 2019-01-22 MED ORDER — SODIUM CHLORIDE 0.9 % IV SOLN
INTRAVENOUS | Status: DC
  Administered 2019-01-22: 08:00:00 via INTRAVENOUS

## 2019-01-22 MED ORDER — CHLORHEXIDINE GLUCONATE 0.12 % MT SOLN
15.0000 mL | Freq: Once | OROMUCOSAL | Status: AC
  Administered 2019-01-22: 15 mL via OROMUCOSAL
  Filled 2019-01-22: qty 15

## 2019-01-22 MED ORDER — TRAMADOL HCL 50 MG PO TABS: 50.0000 mg | ORAL_TABLET | ORAL | Status: DC | PRN

## 2019-01-22 MED ORDER — SODIUM CHLORIDE 0.9 % IV SOLN: INTRAVENOUS | Status: DC

## 2019-01-22 MED ORDER — SODIUM CHLORIDE 0.9 % IV SOLN
INTRAVENOUS | Status: AC
  Administered 2019-01-22: 18:00:00 via INTRAVENOUS

## 2019-01-22 MED ORDER — ACETAMINOPHEN 325 MG PO TABS
650.0000 mg | ORAL_TABLET | Freq: Four times a day (QID) | ORAL | Status: DC | PRN
  Administered 2019-01-22 – 2019-01-28 (×6): 650 mg via ORAL
  Filled 2019-01-22 (×6): qty 2

## 2019-01-22 MED ORDER — DEXMEDETOMIDINE HCL 200 MCG/2ML IV SOLN
INTRAVENOUS | Status: DC | PRN
  Administered 2019-01-22: 35 ug via INTRAVENOUS

## 2019-01-22 MED ORDER — PROPOFOL 10 MG/ML IV BOLUS
INTRAVENOUS | Status: DC | PRN
  Administered 2019-01-22: 70 mg via INTRAVENOUS

## 2019-01-22 MED ORDER — LIDOCAINE HCL 1 % IJ SOLN
INTRAMUSCULAR | Status: AC
  Filled 2019-01-22: qty 20

## 2019-01-22 MED ORDER — ACETAMINOPHEN 650 MG RE SUPP: 650.0000 mg | Freq: Four times a day (QID) | RECTAL | Status: DC | PRN

## 2019-01-22 MED ORDER — MELATONIN 3 MG PO TABS
3.0000 mg | ORAL_TABLET | Freq: Every evening | ORAL | Status: DC | PRN
  Filled 2019-01-22: qty 1

## 2019-01-22 MED ORDER — PAPAVERINE HCL 30 MG/ML IJ SOLN
INTRAMUSCULAR | Status: AC
  Filled 2019-01-22: qty 2

## 2019-01-22 MED ORDER — BUSPIRONE HCL 10 MG PO TABS
10.0000 mg | ORAL_TABLET | Freq: Two times a day (BID) | ORAL | Status: DC
  Administered 2019-01-23 – 2019-01-28 (×11): 10 mg via ORAL
  Filled 2019-01-22 (×11): qty 1

## 2019-01-22 MED ORDER — LIDOCAINE HCL (PF) 1 % IJ SOLN
INTRAMUSCULAR | Status: DC | PRN
  Administered 2019-01-22 (×2): 5 mL via INTRADERMAL

## 2019-01-22 MED ORDER — SODIUM CHLORIDE 0.9 % IV SOLN
1.5000 g | Freq: Two times a day (BID) | INTRAVENOUS | Status: AC
  Administered 2019-01-22 – 2019-01-24 (×3): 1.5 g via INTRAVENOUS
  Filled 2019-01-22 (×5): qty 1.5

## 2019-01-22 MED ORDER — PROTAMINE SULFATE 10 MG/ML IV SOLN
INTRAVENOUS | Status: DC | PRN
  Administered 2019-01-22: 10 mg via INTRAVENOUS
  Administered 2019-01-22 (×2): 50 mg via INTRAVENOUS
  Administered 2019-01-22: 10 mg via INTRAVENOUS

## 2019-01-22 MED ORDER — OXYCODONE HCL 5 MG PO TABS: 5.0000 mg | ORAL_TABLET | ORAL | Status: DC | PRN

## 2019-01-22 MED ORDER — SODIUM CHLORIDE 0.9 % IV SOLN
INTRAVENOUS | Status: AC
  Filled 2019-01-22: qty 1.2

## 2019-01-22 MED ORDER — SODIUM CHLORIDE 0.9% FLUSH: 3.0000 mL | INTRAVENOUS | Status: DC | PRN

## 2019-01-22 SURGICAL SUPPLY — 56 items
BANDAGE ESMARK 6X9 LF (GAUZE/BANDAGES/DRESSINGS) IMPLANT
BNDG CMPR 9X6 STRL LF SNTH (GAUZE/BANDAGES/DRESSINGS)
BNDG ELASTIC 4X5.8 VLCR STR LF (GAUZE/BANDAGES/DRESSINGS) IMPLANT
BNDG ESMARK 6X9 LF (GAUZE/BANDAGES/DRESSINGS)
CANISTER SUCT 3000ML PPV (MISCELLANEOUS) ×2 IMPLANT
CANNULA VESSEL 3MM 2 BLNT TIP (CANNULA) ×2 IMPLANT
CLIP VESOCCLUDE MED 24/CT (CLIP) ×2 IMPLANT
CLIP VESOCCLUDE SM WIDE 24/CT (CLIP) ×2 IMPLANT
COVER WAND RF STERILE (DRAPES) ×2 IMPLANT
CUFF TOURN SGL QUICK 24 (TOURNIQUET CUFF)
CUFF TOURN SGL QUICK 34 (TOURNIQUET CUFF)
CUFF TOURN SGL QUICK 42 (TOURNIQUET CUFF) IMPLANT
CUFF TRNQT CYL 24X4X16.5-23 (TOURNIQUET CUFF) IMPLANT
CUFF TRNQT CYL 34X4.125X (TOURNIQUET CUFF) IMPLANT
DRAIN CHANNEL 15F RND FF W/TCR (WOUND CARE) IMPLANT
DRAIN CHANNEL 19F RND (DRAIN) ×1 IMPLANT
DRAIN PENROSE 3/4X12 (DRAIN) IMPLANT
DRAPE X-RAY CASS 24X20 (DRAPES) IMPLANT
DRSG TEGADERM 4X4.5 CHG (GAUZE/BANDAGES/DRESSINGS) ×2 IMPLANT
ELECT REM PT RETURN 9FT ADLT (ELECTROSURGICAL) ×2
ELECTRODE REM PT RTRN 9FT ADLT (ELECTROSURGICAL) ×1 IMPLANT
EVACUATOR SILICONE 100CC (DRAIN) ×1 IMPLANT
GAUZE SPONGE 4X4 12PLY STRL LF (GAUZE/BANDAGES/DRESSINGS) ×2 IMPLANT
GLOVE BIO SURGEON STRL SZ7.5 (GLOVE) ×2 IMPLANT
GLOVE BIOGEL PI IND STRL 8 (GLOVE) ×1 IMPLANT
GLOVE BIOGEL PI INDICATOR 8 (GLOVE) ×1
GOWN STRL REUS W/ TWL LRG LVL3 (GOWN DISPOSABLE) ×3 IMPLANT
GOWN STRL REUS W/TWL LRG LVL3 (GOWN DISPOSABLE) ×6
KIT BASIN OR (CUSTOM PROCEDURE TRAY) ×2 IMPLANT
KIT TURNOVER KIT B (KITS) ×2 IMPLANT
MARKER GRAFT CORONARY BYPASS (MISCELLANEOUS) IMPLANT
NS IRRIG 1000ML POUR BTL (IV SOLUTION) ×4 IMPLANT
PACK PERIPHERAL VASCULAR (CUSTOM PROCEDURE TRAY) ×2 IMPLANT
PAD ARMBOARD 7.5X6 YLW CONV (MISCELLANEOUS) ×4 IMPLANT
SET COLLECT BLD 21X3/4 12 (NEEDLE) IMPLANT
SPONGE LAP 18X18 RF (DISPOSABLE) ×1 IMPLANT
SPONGE SURGIFOAM ABS GEL 100 (HEMOSTASIS) IMPLANT
STAPLER VISISTAT (STAPLE) IMPLANT
STOPCOCK 4 WAY LG BORE MALE ST (IV SETS) IMPLANT
SUT ETHILON 3 0 PS 1 (SUTURE) IMPLANT
SUT PROLENE 5 0 C 1 24 (SUTURE) ×2 IMPLANT
SUT PROLENE 6 0 BV (SUTURE) ×2 IMPLANT
SUT PROLENE 7 0 BV 1 (SUTURE) IMPLANT
SUT SILK 2 0 PERMA HAND 18 BK (SUTURE) ×2 IMPLANT
SUT SILK 3 0 (SUTURE)
SUT SILK 3-0 18XBRD TIE 12 (SUTURE) IMPLANT
SUT VIC AB 2-0 CTB1 (SUTURE) ×3 IMPLANT
SUT VIC AB 3-0 SH 27 (SUTURE) ×4
SUT VIC AB 3-0 SH 27X BRD (SUTURE) ×2 IMPLANT
SUT VICRYL 4-0 PS2 18IN ABS (SUTURE) ×3 IMPLANT
TAPE CLOTH SURG 4X10 WHT LF (GAUZE/BANDAGES/DRESSINGS) ×1 IMPLANT
TOWEL GREEN STERILE (TOWEL DISPOSABLE) ×2 IMPLANT
TRAY FOLEY MTR SLVR 16FR STAT (SET/KITS/TRAYS/PACK) ×2 IMPLANT
TUBING EXTENTION W/L.L. (IV SETS) IMPLANT
UNDERPAD 30X30 (UNDERPADS AND DIAPERS) ×2 IMPLANT
WATER STERILE IRR 1000ML POUR (IV SOLUTION) ×2 IMPLANT

## 2019-01-22 SURGICAL SUPPLY — 33 items
BAG SNAP BAND KOVER 36X36 (MISCELLANEOUS) ×5 IMPLANT
BLANKET WARM UNDERBOD FULL ACC (MISCELLANEOUS) ×2 IMPLANT
CABLE ADAPT PACING TEMP 12FT (ADAPTER) ×1 IMPLANT
CATH 23 ULTRA DELIVERY (CATHETERS) ×1 IMPLANT
CATH DIAG 6FR PIGTAIL ANGLED (CATHETERS) ×2 IMPLANT
CATH INFINITI 6F AL1 (CATHETERS) ×1 IMPLANT
CATH S G BIP PACING (CATHETERS) ×1 IMPLANT
CLOSURE MYNX CONTROL 6F/7F (Vascular Products) ×1 IMPLANT
CRIMPER (MISCELLANEOUS) ×1 IMPLANT
DEVICE CLOSURE PERCLS PRGLD 6F (VASCULAR PRODUCTS) IMPLANT
DEVICE INFLATION ATRION QL2530 (MISCELLANEOUS) ×1 IMPLANT
ELECT DEFIB PAD ADLT CADENCE (PAD) ×1 IMPLANT
GUIDEWIRE SAFE TJ AMPLATZ EXST (WIRE) ×1 IMPLANT
KIT HEART LEFT (KITS) ×2 IMPLANT
KIT MICROPUNCTURE NIT STIFF (SHEATH) ×1 IMPLANT
PACK CARDIAC CATHETERIZATION (CUSTOM PROCEDURE TRAY) ×2 IMPLANT
PERCLOSE PROGLIDE 6F (VASCULAR PRODUCTS) ×4
SHEATH 14X36 EDWARDS (SHEATH) ×1 IMPLANT
SHEATH BRITE TIP 7FR 35CM (SHEATH) ×1 IMPLANT
SHEATH PINNACLE 6F 10CM (SHEATH) ×1 IMPLANT
SHEATH PINNACLE 8F 10CM (SHEATH) ×1 IMPLANT
SHEATH PROBE COVER 6X72 (BAG) ×1 IMPLANT
SLEEVE REPOSITIONING LENGTH 30 (MISCELLANEOUS) ×1 IMPLANT
STOPCOCK MORSE 400PSI 3WAY (MISCELLANEOUS) ×4 IMPLANT
SYR MEDRAD MARK V 150ML (SYRINGE) ×1 IMPLANT
TRANSDUCER W/STOPCOCK (MISCELLANEOUS) ×4 IMPLANT
TUBE CONN 8.8X1320 FR HP M-F (CONNECTOR) ×1 IMPLANT
TUBING CIL FLEX 10 FLL-RA (TUBING) ×1 IMPLANT
VALVE 23 ULTRA SAPIEN KIT (Valve) ×1 IMPLANT
WIRE AMPLATZ SS-J .035X180CM (WIRE) ×1 IMPLANT
WIRE EMERALD 3MM-J .035X150CM (WIRE) ×1 IMPLANT
WIRE EMERALD 3MM-J .035X260CM (WIRE) ×1 IMPLANT
WIRE EMERALD ST .035X260CM (WIRE) ×1 IMPLANT

## 2019-01-22 NOTE — Anesthesia Procedure Notes (Signed)
Procedure Name: Intubation Date/Time: 01/22/2019 8:04 PM Performed by: Jearld Pies, CRNA Pre-anesthesia Checklist: Patient identified, Emergency Drugs available, Suction available and Patient being monitored Patient Re-evaluated:Patient Re-evaluated prior to induction Oxygen Delivery Method: Circle System Utilized Preoxygenation: Pre-oxygenation with 100% oxygen Induction Type: IV induction, Rapid sequence and Cricoid Pressure applied Laryngoscope Size: Mac and 3 Grade View: Grade I Tube type: Oral Tube size: 7.0 mm Number of attempts: 1 Airway Equipment and Method: Stylet Placement Confirmation: ETT inserted through vocal cords under direct vision,  positive ETCO2 and breath sounds checked- equal and bilateral Secured at: 22 cm Tube secured with: Tape Dental Injury: Teeth and Oropharynx as per pre-operative assessment

## 2019-01-22 NOTE — Progress Notes (Signed)
After 30 minutes manual pressure hematoma much larger. PA paged. Manual pressure held again.   1836 PA at bedside.  Clyde Canterbury, RN

## 2019-01-22 NOTE — Interval H&P Note (Signed)
History and Physical Interval Note:  01/22/2019 9:27 AM  Rachel Santos  has presented today for surgery, with the diagnosis of Severe Aortic Stenosis.  The various methods of treatment have been discussed with the patient and family. After consideration of risks, benefits and other options for treatment, the patient has consented to  Procedure(s): TRANSCATHETER AORTIC VALVE REPLACEMENT, TRANSFEMORAL (N/A) TRANSESOPHAGEAL ECHOCARDIOGRAM (TEE) (N/A) as a surgical intervention.  The patient's history has been reviewed, patient examined, no change in status, stable for surgery.  I have reviewed the patient's chart and labs.  Questions were answered to the patient's satisfaction.     Lauree Chandler

## 2019-01-22 NOTE — Anesthesia Procedure Notes (Signed)
Arterial Line Insertion Start/End10/27/2020 8:30 AM, 01/22/2019 8:40 AM Performed by: Oleta Mouse, MD, anesthesiologist  Patient location: Pre-op. Preanesthetic checklist: patient identified, IV checked, site marked, risks and benefits discussed, surgical consent, monitors and equipment checked, pre-op evaluation, timeout performed and anesthesia consent Lidocaine 1% used for infiltration Right, radial was placed Catheter size: 20 G Hand hygiene performed  and maximum sterile barriers used   Attempts: 1 Procedure performed without using ultrasound guided technique. Following insertion, dressing applied and Biopatch. Post procedure assessment: normal and unchanged  Patient tolerated the procedure well with no immediate complications.

## 2019-01-22 NOTE — Anesthesia Postprocedure Evaluation (Signed)
Anesthesia Post Note  Patient: Rachel Santos  Procedure(s) Performed: RIGHT GROIN EXPLORATION with evacuation of hematoma (Right ) Right Common Femoral Artery Repair (Right )     Patient location during evaluation: PACU Anesthesia Type: General Level of consciousness: awake and alert Pain management: pain level controlled Vital Signs Assessment: post-procedure vital signs reviewed and stable Respiratory status: spontaneous breathing, nonlabored ventilation, respiratory function stable and patient connected to nasal cannula oxygen Cardiovascular status: blood pressure returned to baseline and stable Postop Assessment: no apparent nausea or vomiting Anesthetic complications: no    Last Vitals:  Vitals:   01/22/19 2201 01/22/19 2242  BP: (!) 120/54   Pulse: 68   Resp: 16   Temp: 37.1 C 36.8 C  SpO2: 99%     Last Pain:  Vitals:   01/22/19 2242  TempSrc: Oral  PainSc:                  Effie Berkshire

## 2019-01-22 NOTE — Progress Notes (Signed)
Site area: Left  groin a 7 french long venous sheath was removed  Site Prior to Removal:  Level 0  Pressure Applied For 15 MINUTES    Minutes Beginning at 1330p  Manual:   Yes.    Patient Status During Pull:  stable  Post Pull Groin Site:  Level 0  Post Pull Instructions Given:  Yes.    Post Pull Pulses Present:  Yes.    Dressing Applied:  Yes.    Comments:

## 2019-01-22 NOTE — Anesthesia Preprocedure Evaluation (Signed)
Anesthesia Evaluation  Patient identified by MRN, date of birth, ID band Patient awake    Reviewed: Allergy & Precautions, NPO status , Patient's Chart, lab work & pertinent test results  History of Anesthesia Complications (+) PONV and history of anesthetic complications  Airway Mallampati: II  TM Distance: >3 FB Neck ROM: Full    Dental  (+) Edentulous Upper, Edentulous Lower, Upper Dentures, Lower Dentures   Pulmonary neg recent URI,    breath sounds clear to auscultation       Cardiovascular hypertension, Pt. on medications + dysrhythmias Atrial Fibrillation + Valvular Problems/Murmurs AS  Rhythm:Regular Rate:Normal + Systolic murmurs   1. Left ventricular ejection fraction, by visual estimation, is 60 to 65%. The left ventricle has normal function. There is mildly increased left ventricular hypertrophy.  2. Elevated mean left atrial pressure.  3. Indeterminate findings due to mitral stenosis. pattern of LV diastolic filling.  4. Global right ventricle has normal systolic function.The right ventricular size is normal. No increase in right ventricular wall thickness.  5. Right atrial size was normal.  6. Moderate thickening of the mitral valve leaflet(s).  7. Severe mitral annular calcification.  8. The mitral valve has been repaired with an annuloplasty ring. Mild mitral valve regurgitation. Mild-moderate mitral stenosis.  9. Gradients across the mitral valve are higher compared to the August 2020 study, despite a slower heart rate. However, today's study shows mitral valve gradients similar to the 2018 and 2019 echo studies. 10. The aortic valve is tricuspid Aortic valve regurgitation was not visualized by color flow Doppler. Severe aortic valve stenosis. 11. The pulmonic valve was grossly normal. Pulmonic valve regurgitation is not visualized by color flow Doppler. 12. TR signal is inadequate for assessing pulmonary artery  systolic pressure. 13. The interatrial septum was not assessed. 14. Left atrial size was moderately dilated. 15. The tricuspid valve is normal in structure. Tricuspid valve regurgitation was not visualized by color flow Doppler. 16. S/P surgical tricuspid valve repair.   Neuro/Psych PSYCHIATRIC DISORDERS Anxiety TIA   GI/Hepatic Neg liver ROS, GERD  Medicated,  Endo/Other  diabetes, Type 2  Renal/GU Renal InsufficiencyRenal disease     Musculoskeletal  (+) Arthritis ,   Abdominal   Peds  Hematology negative hematology ROS (+)   Anesthesia Other Findings   Reproductive/Obstetrics                             Anesthesia Physical Anesthesia Plan  ASA: IV  Anesthesia Plan: MAC   Post-op Pain Management:    Induction: Intravenous  PONV Risk Score and Plan: 3 and Treatment may vary due to age or medical condition and Propofol infusion  Airway Management Planned: Nasal Cannula  Additional Equipment: Arterial line, CVP and Ultrasound Guidance Line Placement  Intra-op Plan:   Post-operative Plan:   Informed Consent: I have reviewed the patients History and Physical, chart, labs and discussed the procedure including the risks, benefits and alternatives for the proposed anesthesia with the patient or authorized representative who has indicated his/her understanding and acceptance.     Dental advisory given  Plan Discussed with: CRNA and Surgeon  Anesthesia Plan Comments:         Anesthesia Quick Evaluation

## 2019-01-22 NOTE — Transfer of Care (Signed)
Immediate Anesthesia Transfer of Care Note  Patient: Rachel Santos  Procedure(s) Performed: TRANSCATHETER AORTIC VALVE REPLACEMENT, TRANSFEMORAL (N/A ) TRANSESOPHAGEAL ECHOCARDIOGRAM (TEE) (N/A )  Patient Location: Cath Lab  Anesthesia Type:MAC  Level of Consciousness: awake, alert  and oriented  Airway & Oxygen Therapy: Patient Spontanous Breathing and Patient connected to nasal cannula oxygen  Post-op Assessment: Report given to RN, Post -op Vital signs reviewed and stable and Patient moving all extremities X 4  Post vital signs: Reviewed and stable  Last Vitals:  Vitals Value Taken Time  BP    Temp    Pulse 45 01/22/19 1106  Resp 18 01/22/19 1106  SpO2 100 % 01/22/19 1106  Vitals shown include unvalidated device data.  Last Pain:  Vitals:   01/22/19 0721  PainSc: 0-No pain         Complications: No apparent anesthesia complications

## 2019-01-22 NOTE — Consult Note (Signed)
Patient name: Rachel Santos MRN: [id number] DOB: Jan 21, 1941 Sex: female  REASON FOR VISIT:   Bleeding from right groin status post TAVR.  The consult is requested by cardiology.  HPI:   Rachel Santos is a pleasant 78 y.o. female with a history of severe aortic stenosis.  She underwent transcatheter aortic valve replacement today via a percutaneous right transfemoral approach by Dr. Roxy Manns and Dr. Angelena Form.  The procedure was done with 2 Perclose devices and a 14 French sheath was used from the right side.   The patient was reportedly doing well.  She walked 200 feet in the hallway with a walker.  On return from walking she developed a large right groin hematoma.  Manual pressure was held.  Despite pressure hematoma appeared to be enlarging and vascular surgery was consulted urgently.  Current Facility-Administered Medications  Medication Dose Route Frequency Provider Last Rate Last Dose  . 0.9 %  sodium chloride infusion   Intravenous Continuous Eileen Stanford, PA-C 50 mL/hr at 01/22/19 1739    . 0.9 %  sodium chloride infusion  250 mL Intravenous PRN Eileen Stanford, PA-C      . acetaminophen (TYLENOL) tablet 650 mg  650 mg Oral Q6H PRN Eileen Stanford, PA-C   650 mg at 01/22/19 1545   Or  . acetaminophen (TYLENOL) suppository 650 mg  650 mg Rectal Q6H PRN Eileen Stanford, PA-C      . [START ON 01/23/2019] aspirin chewable tablet 81 mg  81 mg Oral Daily Eileen Stanford, PA-C      . [START ON 01/23/2019] busPIRone (BUSPAR) tablet 10 mg  10 mg Oral BID Eileen Stanford, PA-C      . cefUROXime (ZINACEF) 1.5 g in sodium chloride 0.9 % 100 mL IVPB  1.5 g Intravenous Q12H Eileen Stanford, PA-C   Stopped at 01/22/19 1623  . insulin aspart (novoLOG) injection 0-24 Units  0-24 Units Subcutaneous TID AC & HS Eileen Stanford, PA-C   8 Units at 01/22/19 1736  . Melatonin TABS 3 mg  3 mg Oral QHS PRN Einar Grad, RPH      . morphine 2 MG/ML injection 1-4 mg   1-4 mg Intravenous Q1H PRN Eileen Stanford, PA-C   1 mg at 01/22/19 1859  . nitroGLYCERIN 50 mg in dextrose 5 % 250 mL (0.2 mg/mL) infusion  0-100 mcg/min Intravenous Titrated Eileen Stanford, PA-C      . ondansetron [facility]) injection 4 mg  4 mg Intravenous Q6H PRN Eileen Stanford, PA-C      . oxyCODONE (Oxy IR/ROXICODONE) immediate release tablet 5-10 mg  5-10 mg Oral Q3H PRN Eileen Stanford, PA-C      . [START ON 01/23/2019] pantoprazole (PROTONIX) EC tablet 40 mg  40 mg Oral Daily Angelena Form R, PA-C      . phenylephrine (NEOSYNEPHRINE) 20-0.9 MG/250ML-% infusion  0-100 mcg/min Intravenous Titrated Eileen Stanford, PA-C      . [START ON 01/23/2019] pravastatin (PRAVACHOL) tablet 40 mg  40 mg Oral q1800 Eileen Stanford, PA-C      . sodium chloride flush (NS) 0.9 % injection 3 mL  3 mL Intravenous Q12H Eileen Stanford, PA-C      . sodium chloride flush (NS) 0.9 % injection 3 mL  3 mL Intravenous PRN Eileen Stanford, PA-C      . traMADol Veatrice Bourbon) tablet 50-100 mg  50-100 mg Oral Q4H PRN Eileen Stanford, PA-C      .  vancomycin (VANCOCIN) IVPB 1000 mg/200 mL premix  1,000 mg Intravenous Once Eileen Stanford, PA-C      . [START ON 01/23/2019] venlafaxine XR (EFFEXOR-XR) 24 hr capsule 150 mg  150 mg Oral Q breakfast Eileen Stanford, PA-C      . [START ON 01/23/2019] venlafaxine XR (EFFEXOR-XR) 24 hr capsule 75 mg  75 mg Oral Q breakfast Eileen Stanford, PA-C        REVIEW OF SYSTEMS:  [X]  denotes positive finding, [ ]  denotes negative finding Vascular    Leg swelling    Cardiac    Chest pain or chest pressure:    Shortness of breath upon exertion:    Short of breath when lying flat:    Irregular heart rhythm:    Constitutional    Fever or chills:     PHYSICAL EXAM:   Vitals:   01/22/19 1830 01/22/19 1839 01/22/19 1845 01/22/19 1849  BP: (!) 146/59 (!) 160/91 (!) 155/61 (!) 152/50  Pulse: 65 75 65 69  Resp: 16 (!) 24 (!) 22 19   Temp:      TempSrc:      SpO2: 100% 100% 99% 99%  Weight:      Height:        GENERAL: The patient is a well-nourished female, in no acute distress. The vital signs are documented above. CARDIOVASCULAR: There is a regular rate and rhythm. PULMONARY: There is good air exchange bilaterally without wheezing or rales. There is a large hematoma in the right groin extending up the anterior lateral abdominal wall.  There are Doppler signals in both feet.  DATA:   Hemoglobin at 5 PM was 10.4.  MEDICAL ISSUES:   LARGE RIGHT GROIN HEMATOMA: This patient developed a large right groin hematoma after ambulating today.  She had percutaneous access to the right groin with a 14 French sheath and this was closed with 2 Perclose devices.  She developed a hematoma suddenly after ambulating in the hall.  Given to the hematoma appears to be enlarging despite aggressive pressure I recommended urgent exploration in the operating room.  I reviewed the indications for the procedure and the potential complications with the patient and her family member who is at the bedside.  I explained that perhaps the biggest risk is wound healing problems given that the tissue was compromised by the large hematoma.  We will proceed urgently.  Deitra Mayo [facility] [phone]

## 2019-01-22 NOTE — Anesthesia Procedure Notes (Signed)
Central Venous Catheter Insertion Performed by: Oleta Mouse, MD, anesthesiologist Start/End10/27/2020 8:40 AM, 01/22/2019 8:50 AM Patient location: Pre-op. Preanesthetic checklist: patient identified, IV checked, site marked, risks and benefits discussed, surgical consent, monitors and equipment checked, pre-op evaluation, timeout performed and anesthesia consent Position: supine Lidocaine 1% used for infiltration and patient sedated Hand hygiene performed  and maximum sterile barriers used  Catheter size: 8 Fr Total catheter length 16. Central line was placed.Double lumen Procedure performed using ultrasound guided technique. Ultrasound Notes:image(s) printed for medical record Attempts: 1 Following insertion, dressing applied, line sutured and Biopatch. Post procedure assessment: blood return through all ports, free fluid flow and no air  Patient tolerated the procedure well with no immediate complications.

## 2019-01-22 NOTE — CV Procedure (Signed)
[facility]  TAVR OPERATIVE NOTE   Date of Procedure:  01/22/2019  Preoperative Diagnosis: Severe Aortic Stenosis   Postoperative Diagnosis: Same   Procedure:    Transcatheter Aortic Valve Replacement - Transfemoral Approach  Edwards Sapien 3 THV (size 23 mm, model [id number] , serial # [id number])   Co-Surgeons:  Lauree Chandler, MD and Valentina Gu. 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 I saw her as a new patient for evaluation of atrial fibrillation and severe MR on 07/03/12. She was diagnosed with atrial fibrillation April of 2014 as well as severe MR and TR. LV function has always been normal. She had a TEE guided cardioversion in May 2016 with restoration of sinus rhythm. Her MR was noted to be central and severe. The LV cavity was not dilated. LV systolic function was normal. There was moderate to severe TR as well. She was felt to be symptomatic from her mitral valve disease. Cardiac cath on 09/05/12 with mild CAD. She underwent placement of mitral valve ring and tricuspid valve ring as well as MAZE procedure per Dr. Roxy Manns on 10/11/12. Post-operative course complicated by recurrence of atrial fibrillation with bradycardia. She was seen by Dr. Caryl Comes as an EP consult and pacemaker not recommended. She has been on coumadin. Carotid artery dopplers June 2015 with mild bilateral disease. She was seen in the ED at [facility] 11/07/18 after a syncopal episode. Echo 11/23/18 with LVEF over  65%. Moderate LVH. Stable mitral and tricuspid repair. There appeared to be severe AS. The aortic valve is thickened and calcified. Mean gradient over 50 mmHg. She continues to have fatigue and dizziness. Her dizziness is slightly better off of anti-hypertensive medications. She has had no recent syncopal events.   During the course of the patient's preoperative work up they have been evaluated comprehensively by a multidisciplinary team of specialists coordinated through the [facility] in the [facility].  They have been demonstrated to suffer from symptomatic severe aortic stenosis as noted above. The patient has been counseled extensively as to the relative risks and benefits of all options for the treatment of severe aortic stenosis including long term medical therapy, conventional surgery for aortic valve replacement, and transcatheter aortic valve replacement.  The patient has been independently evaluated by Dr. Roxy Manns with CT surgery and they are felt to be at high risk for conventional surgical aortic valve replacement. The surgeon indicated the patient would be a poor candidate for conventional surgery. Based upon review of all of the patient's preoperative diagnostic tests they are felt to be candidate for transcatheter aortic valve replacement using the transfemoral approach as an alternative to high risk conventional surgery.    Following the decision to proceed with transcatheter aortic valve replacement, a discussion has been held regarding what types of management strategies would be attempted intraoperatively in the event of life-threatening complications, including whether or not the patient would be considered a candidate for the use of cardiopulmonary bypass and/or conversion to open sternotomy for attempted surgical intervention.  The  patient has been advised of a variety of complications that might develop peculiar to this approach including  but not limited to risks of death, stroke, paravalvular leak, aortic dissection or other major vascular complications, aortic annulus rupture, device embolization, cardiac rupture or perforation, acute myocardial infarction, arrhythmia, heart block or bradycardia requiring permanent pacemaker placement, congestive heart failure, respiratory failure, renal failure, pneumonia, infection, other late complications related to structural valve deterioration or migration, or other complications that might ultimately cause a temporary or permanent loss of functional independence or other long term morbidity.  The patient provides full informed consent for the procedure as described and all questions were answered preoperatively.    DETAILS OF THE OPERATIVE PROCEDURE  PREPARATION:   The patient is brought to the operating room on the above mentioned date and central monitoring was established by the anesthesia team including placement of a radial arterial line. The patient is placed in the supine position on the operating table.  Intravenous antibiotics are administered. Conscious sedation is used.   Baseline transthoracic echocardiogram was performed. The patient's chest, abdomen, both groins, and both lower extremities are prepared and draped in a sterile manner. A time out procedure is performed.   PERIPHERAL ACCESS:   Using the modified Seldinger technique, femoral arterial and venous access were obtained with placement of 6 Fr sheaths on the left side using u/s guidance.  A pigtail diagnostic catheter was passed through the femoral arterial sheath under fluoroscopic guidance into the aortic root.  A temporary transvenous pacemaker catheter was passed through the femoral venous sheath under fluoroscopic guidance into the right ventricle.  The pacemaker was tested to ensure stable lead placement and pacemaker capture. Aortic root angiography was performed in order to determine the optimal angiographic angle  for valve deployment.  TRANSFEMORAL ACCESS:  A micropuncture kit was used to gain access to the right femoral artery using u/s guidance. Position confirmed with angiography. Pre-closure with double ProGlide closure devices. The patient was heparinized systemically and ACT verified > 250 seconds.    A 14 Fr transfemoral E-sheath was introduced into the right femoral artery after progressively dilating over an Amplatz superstiff wire. An AL-1 catheter was used to direct a straight-tip exchange length wire across the native aortic valve into the left ventricle. This was exchanged out for a pigtail catheter and position was confirmed in the LV apex. Simultaneous LV and Ao pressures were recorded.  The pigtail catheter was then exchanged for an Amplatz Extra-stiff wire in the LV apex.   TRANSCATHETER HEART VALVE DEPLOYMENT:  An Edwards Sapien 3 THV (size 23 mm) was prepared and crimped per manufacturer's guidelines, and the proper orientation of the valve is confirmed on the [employer] delivery system. The valve was advanced through the introducer sheath using normal technique until in an appropriate position in the abdominal aorta beyond the sheath tip. The balloon was then retracted and using the fine-tuning wheel was centered on the valve. The valve was then advanced across the aortic arch using appropriate flexion of the catheter. The valve was carefully positioned across the aortic valve annulus. The Commander catheter was retracted using normal technique. Once final position of the valve has been confirmed by angiographic assessment, the valve is deployed while temporarily holding ventilation and during rapid ventricular pacing to maintain systolic blood pressure < 50 mmHg and pulse pressure < 10 mmHg. The balloon inflation is held for >3 seconds after reaching full deployment volume. Once the balloon has fully deflated the balloon is  retracted into the ascending aorta and valve function is assessed  using TTE. There is felt to be no paravalvular leak and no central aortic insufficiency.  The patient's hemodynamic recovery following valve deployment is good.  The deployment balloon and guidewire are both removed. Echo demostrated acceptable post-procedural gradients, stable mitral valve function, and no AI.   PROCEDURE COMPLETION:  The sheath was then removed and closure devices were completed. Protamine was administered once femoral arterial repair was complete. The temporary pacemaker, pigtail catheters and femoral sheaths were removed with a Mynx closure device placed in the right femoral artery and manual pressure used for venous hemostasis.    The patient tolerated the procedure well and is transported to the surgical intensive care in stable condition. There were no immediate intraoperative complications. All sponge instrument and needle counts are verified correct at completion of the operation.   No blood products were administered during the operation.  The patient received a total of 60 mL of intravenous contrast during the procedure.  Lauree Chandler MD 01/22/2019 9:20 AM

## 2019-01-22 NOTE — Anesthesia Preprocedure Evaluation (Addendum)
Anesthesia Evaluation  Patient identified by MRN, date of birth, ID band Patient awake    Reviewed: Allergy & Precautions, NPO status , Patient's Chart, lab work & pertinent test results  Airway Mallampati: I       Dental  (+) Edentulous Upper, Edentulous Lower   Pulmonary neg pulmonary ROS,    breath sounds clear to auscultation       Cardiovascular hypertension,  Rhythm:Regular Rate:Normal     Neuro/Psych Anxiety negative neurological ROS     GI/Hepatic Neg liver ROS, GERD  ,  Endo/Other  diabetes  Renal/GU negative Renal ROS     Musculoskeletal  (+) Arthritis ,   Abdominal Normal abdominal exam  (+)   Peds  Hematology negative hematology ROS (+)   Anesthesia Other Findings   Reproductive/Obstetrics                            Echo:  1. Left ventricular ejection fraction, by visual estimation, is 60 to 65%. The left ventricle has normal function. There is mildly increased left ventricular hypertrophy.  2. Elevated mean left atrial pressure.  3. Indeterminate findings due to mitral stenosis. pattern of LV diastolic filling.  4. Global right ventricle has normal systolic function.The right ventricular size is normal. No increase in right ventricular wall thickness.  5. Right atrial size was normal.  6. Moderate thickening of the mitral valve leaflet(s).  7. Severe mitral annular calcification.  8. The mitral valve has been repaired with an annuloplasty ring. Mild mitral valve regurgitation. Mild-moderate mitral stenosis.  9. Gradients across the mitral valve are higher compared to the August 2020 study, despite a slower heart rate. However, today's study shows mitral valve gradients similar to the 2018 and 2019 echo studies. 10. The aortic valve is tricuspid Aortic valve regurgitation was not visualized by color flow Doppler. Severe aortic valve stenosis. 11. The pulmonic valve was grossly  normal. Pulmonic valve regurgitation is not visualized by color flow Doppler. 12. TR signal is inadequate for assessing pulmonary artery systolic pressure. 13. The interatrial septum was not assessed. 14. Left atrial size was moderately dilated. 15. The tricuspid valve is normal in structure. Tricuspid valve regurgitation was not visualized by color flow Doppler. 16. S/P surgical tricuspid valve repair.  Anesthesia Physical Anesthesia Plan  ASA: III  Anesthesia Plan: General   Post-op Pain Management:    Induction: Intravenous, Rapid sequence and Cricoid pressure planned  PONV Risk Score and Plan: 4 or greater and Ondansetron and Treatment may vary due to age or medical condition  Airway Management Planned: Oral ETT  Additional Equipment: Arterial line  Intra-op Plan:   Post-operative Plan: Extubation in OR  Informed Consent: I have reviewed the patients History and Physical, chart, labs and discussed the procedure including the risks, benefits and alternatives for the proposed anesthesia with the patient or authorized representative who has indicated his/her understanding and acceptance.     Dental advisory given  Plan Discussed with: CRNA  Anesthesia Plan Comments:        Anesthesia Quick Evaluation

## 2019-01-22 NOTE — Progress Notes (Signed)
Pt walked 26ft in hallway w/ walker. On return R groin w/ large hematoma. Manual pressure held. Cards PA paged. Verbal order for strict bed rest. Site marked. Will continue to monitor for 30 minutes and page PA if hematoma grows. BP 124/55 (71).  Clyde Canterbury, RN

## 2019-01-22 NOTE — Transfer of Care (Signed)
Immediate Anesthesia Transfer of Care Note  Patient: Rachel Santos  Procedure(s) Performed: RIGHT GROIN EXPLORATION with evacuation of hematoma (Right ) Right Common Femoral Artery Repair (Right )  Patient Location: PACU  Anesthesia Type:General  Level of Consciousness: awake, alert  and oriented  Airway & Oxygen Therapy: Patient Spontanous Breathing and Patient connected to nasal cannula oxygen  Post-op Assessment: Report given to RN and Post -op Vital signs reviewed and stable  Post vital signs: Reviewed and stable  Last Vitals:  Vitals Value Taken Time  BP 116/52 01/22/19 2116  Temp    Pulse 90 01/22/19 2115  Resp 20 01/22/19 2115  SpO2 94 % 01/22/19 2115  Vitals shown include unvalidated device data.  Last Pain:  Vitals:   01/22/19 1700  TempSrc:   PainSc: 5          Complications: No apparent anesthesia complications

## 2019-01-22 NOTE — Progress Notes (Addendum)
  [facility] VALVE TEAM  Patient doing well s/p TAVR. She is hemodynamically stable. She has mild hypertension. Groin sites stable. Temp wire successfully removed. ECG with sinus brady (small p waves) and no high grade block. Arterial line to be discontinued and transfer to 4E. Plan for early ambulation after bedrest completed and hopeful discharge over the next 24-48 hours.   Angelena Form PA-C  [facility]  Pager [phone]

## 2019-01-22 NOTE — Progress Notes (Signed)
Called to evaluate for enlarging R groin hematoma (TAVR sheath access site), that developed after a short walk after end of bedrest. Hematoma enlarged despite manual pressure. Extends from symphisis pubis to above R iliac crest approx 20 cm and firm. Additional pressure held, Fem-o-Stop applied. Maintained good PT pulses, but no detected DP pulses. Hemodynamically stable (140s/70s, sinus rhythm in 70s). Morphine administered for pain. Dr. Scot Dock evaluated the patient and will take to OR. Discussed plan w patient and daughter at the bedside. Notified structural heart team. H/H drawn. 2H bed requested.  Sanda Klein, MD, [facility] [phone] office [phone] pager

## 2019-01-22 NOTE — Op Note (Signed)
    NAME: [name]    MRN: [id number] DOB: Aug 10, 1940    DATE OF OPERATION: 01/22/2019  PREOP DIAGNOSIS:    Expanding hematoma right groin status post TAVR  POSTOP DIAGNOSIS:    Same  PROCEDURE:    Exploration of right groin for bleeding Repair of right common femoral artery Evacuation of hematoma  SURGEON: Judeth Cornfield. Scot Dock, MD  ASSIST: Laurence Slate, PA  ANESTHESIA: General  EBL: Per anesthesia record  INDICATIONS:    Rachel Santos is a 78 y.o. female who underwent a TAVR earlier today and after ambulating developed a large hematoma in the right groin.  Despite pressure this appears to be expanding I recommended urgent exploration in the operating room  FINDINGS:   The Perclose closure was right at the level of the inguinal ligament and was partially caught in the inguinal ligament.  This was dissected free and the common femoral artery was repaired transversely with a running 5-0 Prolene.  TECHNIQUE:   The patient was taken to the operating room and received a general anesthetic.  The abdomen and right groin and thigh were prepped and draped in usual sterile fashion.  An oblique incision was made in the right groin and the dissection carried down to the hematoma which was evacuated.  This is largely dissected laterally.  The dissection was carried down to the common femoral artery and upon dissection further proximally towards the inguinal ligament the Perclose sutures were identified and were partially caught on the inguinal ligament.  I dissected the inguinal ligament away from the artery and was able to ultimately get proximal control of the artery as the artery was bleeding and this was controlled with digital pressure.  I got distal control and then after the patient was heparinized the artery was clamped proximally and distally.  The Perclose sutures were removed.  The arteriotomy in the common femoral artery was closed with a running 5-0 Prolene suture.   There was good hemostasis.  There was good Doppler signals distal to the repair.  The wound was irrigated.  A 19 Blake drain was placed and tunneled laterally.  The wound was closed with a deep layer of 2-0 Vicryl, the subcutaneous layer was closed with 3-0 Vicryl.  The skin was closed with 4-0 Vicryl.  A sterile dressing was applied.  The patient tolerated the procedure well was transferred to the recovery room in stable condition.  All needle and sponge counts were correct.  Deitra Mayo, MD, FACS [facility]  DATE OF DICTATION:   01/22/2019

## 2019-01-22 NOTE — Anesthesia Procedure Notes (Signed)
Procedure Name: MAC Date/Time: 01/22/2019 9:07 AM Performed by: Mariea Clonts, CRNA Pre-anesthesia Checklist: Patient identified, Emergency Drugs available, Suction available, Patient being monitored and Timeout performed Patient Re-evaluated:Patient Re-evaluated prior to induction Oxygen Delivery Method: Simple face mask and Nasal cannula

## 2019-01-22 NOTE — Progress Notes (Signed)
Patient received from cath lab. Oriented to room and unit. CHG wipes applied. VSS. Call bell in reach. Will continue to monitor.  Arletta Bale, RN

## 2019-01-22 NOTE — Op Note (Signed)
[facility]   MULTIDISCIPLINARY HEART VALVE TEAM   TAVR OPERATIVE NOTE   Date of Procedure:  01/22/2019  Preoperative Diagnosis: Severe Aortic Stenosis   Postoperative Diagnosis: Same   Procedure:    Transcatheter Aortic Valve Replacement - Percutaneous Right Transfemoral Approach  Edwards Sapien 3 Ultra THV (size 23 mm, model # 9750TFX, serial # [id number])   Co-Surgeons:  Valentina Gu. Roxy Manns, MD and Lauree Chandler, MD  Anesthesiologist:  Laurie Panda, MD  Echocardiographer:  Sanda Klein, MD  Pre-operative Echo Findings:  Severe aortic stenosis  Normal left ventricular systolic function  Post-operative Echo Findings:  Mild paravalvular leak  Normal left ventricular systolic function   BRIEF CLINICAL NOTE AND INDICATIONS FOR SURGERY  Patient is a 78 year old female with history of mitral regurgitation, tricuspid regurgitation, and recurrent paroxysmal atrial fibrillation on long-term anticoagulation using Xarelto, status post mitral valve repair, tricuspid valve repair, and Maze procedure in 2014, previous TIA, hypertension, type 2 diabetes mellitus on insulin, nonobstructive coronary artery disease, and 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  Patient's cardiac history dates back to 2014 when she presented with TIA in the setting of newly diagnosed paroxysmal atrial fibrillation.  She was found to have severe mitral regurgitation and moderate tricuspid regurgitation and underwent mitral valve repair, tricuspid valve repair, and Maze procedure on October 11, 2012.  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  She also underwent tricuspid ring annuloplasty and maze procedure at that time.   Intraoperative findings were also notable for the presence of aortic valve sclerosis without aortic stenosis on transesophageal echocardiogram.  Her postoperative convalescence was notable only for some transient bradycardia and she was evaluated by Dr. Caryl Comes who did not feel that permanent pacemaker implantation was necessary.  She otherwise did well and was last seen here in our office on December 09, 2013 at which time she was doing well and maintaining sinus rhythm.  She has been followed intermittently ever since by Dr. Angelena Form and clinically has done well until recently.  On November 07, 2018 the patient was evaluated in the emergency department following a syncopal event.  She subsequently underwent follow-up transthoracic echocardiogram which revealed normal left ventricular systolic function with intact mitral valve repair and tricuspid valve repair but at least moderate if not severe aortic stenosis, although transvalvular gradients were felt to be somewhat difficult to assess.  She was seen in follow-up by Dr. Angelena Form November 29, 2018 and transesophageal echocardiogram was planned to further evaluate the patient's aortic valve disease.  However, TEE was ultimately canceled after the patient presented for TEE and evaluated by Dr. Debara Pickett who felt the patient clearly had severe aortic stenosis I did not feel that TEE was indicated.  The patient subsequently underwent diagnostic cardiac catheterization by Dr. Angelena Form on December 24, 2018.  Attempts across aortic valve by catheterization were unsuccessful.  The patient was noted to have mild nonobstructive coronary disease in the left anterior descending and left circumflex territories with moderate nonobstructive disease in the right coronary artery territory.  Right heart pressures were mild to moderately elevated.  CT angiography was performed and the patient referred for surgical consultation.  During the course of the patient's preoperative work  up they have been evaluated comprehensively by a multidisciplinary team of specialists coordinated  through the Multidisciplinary [facility] in the [facility].  They have been demonstrated to suffer from symptomatic severe aortic stenosis as noted above. The patient has been counseled extensively as to the relative risks and benefits of all options for the treatment of severe aortic stenosis including long term medical therapy, conventional surgery for aortic valve replacement, and transcatheter aortic valve replacement.  All questions have been answered, and the patient provides full informed consent for the operation as described.   DETAILS OF THE OPERATIVE PROCEDURE  PREPARATION:    The patient is brought to the operating room on the above mentioned date and appropriate monitoring was established by the anesthesia team. The patient is placed in the supine position on the operating table.  Intravenous antibiotics are administered. The patient is monitored closely throughout the procedure under conscious sedation.     Baseline transthoracic echocardiogram was performed. The patient's chest, abdomen, both groins, and both lower extremities are prepared and draped in a sterile manner. A time out procedure is performed.   PERIPHERAL ACCESS:    Using the modified Seldinger technique, femoral arterial and venous access was obtained with placement of 6 Fr sheaths on the left side.  A pigtail diagnostic catheter was passed through the left arterial sheath under fluoroscopic guidance into the aortic root.  A temporary transvenous pacemaker catheter was passed through the left femoral venous sheath under fluoroscopic guidance into the right ventricle.  The pacemaker was tested to ensure stable lead placement and pacemaker capture. Aortic root angiography was performed in order to determine the optimal angiographic angle for valve deployment.   TRANSFEMORAL ACCESS:    Percutaneous transfemoral access and sheath placement was performed using ultrasound guidance.  The right common femoral artery was cannulated using a micropuncture needle and appropriate location was verified using hand injection angiogram.  A pair of Abbott Perclose percutaneous closure devices were placed and a 6 French sheath replaced into the femoral artery.  The patient was heparinized systemically and ACT verified > 250 seconds.    A 14 Fr transfemoral E-sheath was introduced into the right common femoral artery after progressively dilating over an Amplatz superstiff wire. An AL-1 catheter was used to direct a straight-tip exchange length wire across the native aortic valve into the left ventricle. This was exchanged out for a pigtail catheter and position was confirmed in the LV apex. Simultaneous LV and Ao pressures were recorded.  The pigtail catheter was exchanged for an Amplatz Extra-stiff wire in the LV apex.  Echocardiography was utilized to confirm appropriate wire position and no sign of entanglement in the mitral subvalvular apparatus.   TRANSCATHETER HEART VALVE DEPLOYMENT:   An Edwards Sapien 3 Ultra transcatheter heart valve (size 23 mm, model #[id number], serial [id number]) was prepared and crimped per manufacturer's guidelines, and the proper orientation of the valve is confirmed on the [employer] delivery system. The valve was advanced through the introducer sheath using normal technique until in an appropriate position in the abdominal aorta beyond the sheath tip. The balloon was then retracted and using the fine-tuning wheel was centered on the valve. The valve was then advanced across the aortic arch using appropriate flexion of the catheter. The valve was carefully positioned across the aortic valve annulus. The Commander catheter was retracted using normal technique. Once final position of the valve has been confirmed by angiographic assessment, the valve is deployed while  temporarily holding ventilation and during rapid ventricular pacing to maintain systolic blood  pressure < 50 mmHg and pulse pressure < 10 mmHg. The balloon inflation is held for >3 seconds after reaching full deployment volume. Once the balloon has fully deflated the balloon is retracted into the ascending aorta and valve function is assessed using echocardiography. There is felt to be mild paravalvular leak and no central aortic insufficiency.  The patient's hemodynamic recovery following valve deployment is good.  The deployment balloon and guidewire are both removed.    PROCEDURE COMPLETION:   The sheath was removed and femoral artery closure performed.  Protamine was administered once femoral arterial repair was complete. The pigtail catheter and femoral sheaths were removed with manual pressure used for hemostasis.  A Mynx femoral closure device was utilized following removal of the diagnostic sheath in the left femoral artery.  The temporary pacemaker was left in place due to mild bradycardia after valve deployment.  The patient tolerated the procedure well and is transported to the surgical intensive care in stable condition. There were no immediate intraoperative complications. All sponge instrument and needle counts are verified correct at completion of the operation.   No blood products were administered during the operation.  The patient received a total of 60 mL of intravenous contrast during the procedure.   Rexene Alberts, MD 01/22/2019 10:44 AM

## 2019-01-22 NOTE — Significant Event (Addendum)
Rapid Response Event Note  Overview: Time Called: [id number] Arrival Time: 1845 Event Type: Other (Comment)(Hematoma s/p TAVR)  Called by bedside RN about pt with hematoma to R groin s/p TAVR today. VSS, pt A&O. Instructed RN to call provider and I would get up there as soon as I was finished with other pt.   Initial Focused Assessment: On arrival, pt resting in bed, Dr Sallyanne Kuster at bedside holding manual pressure to R groin. Pt A&O, c/o pain to hematoma site (morphine given prior to my arrival). Vascular consulted and to bedside. HR 67, BP 146/59, RR 16, spO2 100% on RA.  Interventions: Morphine 5mg  total (4mg  given prior to my arrival) Femstop placed  Plan of Care (if not transferred): Plan to take pt to OR with vascular surgery followed by transfer to [place]. Pt daughter at bedside  Event Summary: Name of Physician Notified: [place] at 1810  Name of Consulting Physician Notified: Scot Dock at [place]  Outcome: Transferred (Comment), Other (Comment)(To OR then [place])  Event End Time: 1935  Sherilyn Dacosta

## 2019-01-23 ENCOUNTER — Encounter (HOSPITAL_COMMUNITY): Payer: Self-pay | Admitting: Vascular Surgery

## 2019-01-23 ENCOUNTER — Inpatient Hospital Stay (HOSPITAL_COMMUNITY): Payer: Medicare Other

## 2019-01-23 DIAGNOSIS — I350 Nonrheumatic aortic (valve) stenosis: Secondary | ICD-10-CM | POA: Diagnosis not present

## 2019-01-23 DIAGNOSIS — S301XXA Contusion of abdominal wall, initial encounter: Secondary | ICD-10-CM | POA: Diagnosis not present

## 2019-01-23 DIAGNOSIS — Z952 Presence of prosthetic heart valve: Secondary | ICD-10-CM

## 2019-01-23 DIAGNOSIS — D62 Acute posthemorrhagic anemia: Secondary | ICD-10-CM | POA: Diagnosis not present

## 2019-01-23 DIAGNOSIS — Z006 Encounter for examination for normal comparison and control in clinical research program: Secondary | ICD-10-CM | POA: Diagnosis not present

## 2019-01-23 DIAGNOSIS — I63442 Cerebral infarction due to embolism of left cerebellar artery: Secondary | ICD-10-CM | POA: Diagnosis not present

## 2019-01-23 LAB — BASIC METABOLIC PANEL
Anion gap: 5 (ref 5–15)
BUN: 25 mg/dL — ABNORMAL HIGH (ref 8–23)
CO2: 24 mmol/L (ref 22–32)
Calcium: 8.4 mg/dL — ABNORMAL LOW (ref 8.9–10.3)
Chloride: 105 mmol/L (ref 98–111)
Creatinine, Ser: 0.98 mg/dL (ref 0.44–1.00)
GFR calc Af Amer: >60 mL/min (ref >60)
GFR calc non Af Amer: 55 mL/min — ABNORMAL LOW (ref >60)
Glucose, Bld: 227 mg/dL — ABNORMAL HIGH (ref 70–99)
Potassium: 4.9 mmol/L (ref 3.5–5.1)
Sodium: 134 mmol/L — ABNORMAL LOW (ref 135–145)

## 2019-01-23 LAB — CBC
HCT: 26.1 % — ABNORMAL LOW (ref 36.0–46.0)
Hemoglobin: 8.7 g/dL — ABNORMAL LOW (ref 12.0–15.0)
MCH: 31.1 pg (ref 26.0–34.0)
MCHC: 33.3 g/dL (ref 30.0–36.0)
MCV: 93.2 fL (ref 80.0–100.0)
Platelets: 144 10*3/uL — ABNORMAL LOW (ref 150–400)
RBC: 2.8 MIL/uL — ABNORMAL LOW (ref 3.87–5.11)
RDW: 12.8 % (ref 11.5–15.5)
WBC: 10.6 10*3/uL — ABNORMAL HIGH (ref 4.0–10.5)
nRBC: 0 % (ref 0.0–0.2)

## 2019-01-23 LAB — ECHOCARDIOGRAM LIMITED
Height: 66 in
Weight: 2479.73 oz

## 2019-01-23 LAB — GLUCOSE, CAPILLARY
Glucose-Capillary: 138 mg/dL — ABNORMAL HIGH (ref 70–99)
Glucose-Capillary: 168 mg/dL — ABNORMAL HIGH (ref 70–99)
Glucose-Capillary: 184 mg/dL — ABNORMAL HIGH (ref 70–99)
Glucose-Capillary: 200 mg/dL — ABNORMAL HIGH (ref 70–99)
Glucose-Capillary: 213 mg/dL — ABNORMAL HIGH (ref 70–99)
Glucose-Capillary: 214 mg/dL — ABNORMAL HIGH (ref 70–99)
Glucose-Capillary: 221 mg/dL — ABNORMAL HIGH (ref 70–99)

## 2019-01-23 LAB — LIPID PANEL
Cholesterol: 112 mg/dL (ref 0–200)
HDL: 38 mg/dL — ABNORMAL LOW (ref >40)
LDL Cholesterol: 44 mg/dL (ref 0–99)
Total CHOL/HDL Ratio: 2.9 RATIO
Triglycerides: 149 mg/dL (ref <150)
VLDL: 30 mg/dL (ref 0–40)

## 2019-01-23 LAB — MAGNESIUM: Magnesium: 1.7 mg/dL (ref 1.7–2.4)

## 2019-01-23 LAB — TROPONIN I (HIGH SENSITIVITY): Troponin I (High Sensitivity): 93 ng/L — ABNORMAL HIGH (ref <18)

## 2019-01-23 MED ORDER — ORAL CARE MOUTH RINSE
15.0000 mL | Freq: Two times a day (BID) | OROMUCOSAL | Status: DC
  Administered 2019-01-23 – 2019-01-27 (×9): 15 mL via OROMUCOSAL

## 2019-01-23 MED ORDER — SODIUM CHLORIDE 0.9 % IV BOLUS
500.0000 mL | Freq: Once | INTRAVENOUS | Status: AC
  Administered 2019-01-23: 500 mL via INTRAVENOUS

## 2019-01-23 MED ORDER — HEPARIN (PORCINE) 25000 UT/250ML-% IV SOLN
800.0000 [IU]/h | INTRAVENOUS | Status: DC
  Administered 2019-01-23: 850 [IU]/h via INTRAVENOUS
  Administered 2019-01-24: 800 [IU]/h via INTRAVENOUS
  Filled 2019-01-23 (×2): qty 250

## 2019-01-23 MED FILL — Lidocaine HCl Local Inj 1%: INTRAMUSCULAR | Qty: 20 | Status: AC

## 2019-01-23 NOTE — Progress Notes (Addendum)
Was called by patient's nurse around 7:40 PM that patient had difficulty with language.  This was a significant worsening from how she was 1 hour ago.  I assessed  the patient bedside, she appeared to have mild aphasia..  She incorrectly stated the month as well as her age.  She had trouble repeating the sentence when asked her to repeat.  She was able to read the first 2 sentences from the NIH chart, however had difficulty with the remaining sentences.  Mild slurred speech. She scored a total of NIH of 4.  No signs of neglect, motor weakness, facial droop.  Sensation intact bilaterally.  I reviewed her chart, patient had right facial droop and difficulty with speech that was first noted today afternoon with code stroke being called to 1:35 PM today.  If your symptoms had improved for less than 5 minutes and should return back to her baseline.  However around 5:00pm the daughter states that she had trouble texting on her phone and has returned to baseline with waxing and waning of mental status.  Impression Acute ischemic stroke  tPA  Not administered due to right groin hematoma s/p surgical intervention. Not sure  are  not sure of her exact last known normal and also symptoms of mild aphasia. Risk of hemorrhage from TPA outweighs benefit. Repeated CT head, no new hemorrhage or large stroke.  Started heparin drip with close monitoring.  Patient did not have peripheral line and so CTA was not performed immediately,  however low suspicion of large vessel occlusion clinically - will get MRA.   Plan MRI brain and MR angiogram Continue heparin drip Avoid hypotension, blood pressure goal greater than [id number] systolic and less than [id number] systolic  Close monitoring of groin hematoma Frequent neurochecks every 2 hours, please notify neruohospitalist MD immediately if patient worsens.     This patient is neurologically critically ill due to acute left MCA stroke.  She is at risk for worsening due to  worsening stroke, new stroke in the setting of A. Fib, bleeding complications with heparin drip.  Complex decision making required in this case including decision not to administer IV TPA. This patient's care requires constant monitoring of vital signs, hemodynamics, respiratory and cardiac monitoring, review of multiple databases, neurological assessment, discussion with family, other specialists and medical decision making of high complexity.  I spent 40  minutes of neurocritical time in the care of this patient.

## 2019-01-23 NOTE — Progress Notes (Signed)
At approx 1820h, found Pt showing more difficulty articulating self with slurred speech (more pronounced compared to episode early in the day). Daughter initially called after noticing these changes while speaking with pt. Pt unable to tell me her last name and disoriented but following simple commands. No complaints of headache. BP 127/37. CBG 184. Called Rapid response - LayedPt flat in bed and gave 500 mL bolus NS. Paged Neurology. Dr. Malen Gauze at bedside and rapid response nurse at bedside.  Rachel Santos 01/23/2019 2000h

## 2019-01-23 NOTE — Progress Notes (Signed)
ANTICOAGULATION CONSULT NOTE - Initial Consult  Pharmacy Consult for heparin Indication: atrial fibrillation and stroke protocol  Allergies  Allergen Reactions  . Metformin And Related Diarrhea  . Ultram [Tramadol] Nausea And Vomiting  . Nickel Rash    Pt unable to wear jewelry made of nickel.  . Pneumococcal Vaccines Swelling and Rash    At injection set    Patient Measurements: Height: 5\' 6"  (167.6 cm) Weight: 154 lb 15.7 oz (70.3 kg) IBW/kg (Calculated) : 59.3 Heparin Dosing Weight: 69kg  Vital Signs: Temp: 98.5 F (36.9 C) (10/28 1639) Temp Source: Oral (10/28 1639) BP: 114/57 (10/28 1500)  Labs: Recent Labs    01/22/19 0728  01/22/19 1141 01/22/19 1155 01/22/19 1903 01/23/19 0328  HGB  --    < > 8.8* 10.2* 10.4* 8.7*  HCT  --    < > 26.0* 30.0* 31.7* 26.1*  PLT  --   --   --   --   --  144*  LABPROT 13.6  --   --   --   --   --   INR 1.1  --   --   --   --   --   CREATININE  --    < > 0.50 0.70  --  0.98  TROPONINIHS  --   --   --   --   --  93*   < > = values in this interval not displayed.    Estimated Creatinine Clearance: 43.6 mL/min (by C-G formula based on SCr of 0.98 mg/dL).   Medical History: Past Medical History:  Diagnosis Date  . Anxiety   . Arthritis    knees  . Atrial fibrillation ([place])   . Cataracts, bilateral   . Diabetes mellitus   . GERD (gastroesophageal reflux disease)   . History of TIA (transient ischemic attack)    mini stroke with vision problems  . Hypertension   . Mitral regurgitation   . S/P Maze operation for atrial fibrillation 10/11/2012   Complete bilateral atrial lesion set using bipolar radiofrequency and cryothermy ablation with clipping of LA appendage  . 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   70mm Sorin Memo 3D ring annuloplasty with 26 mm Edwards mc3 tricuspid ring annuloplasty   . 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  Tricuspid regurgitation    Assessment: 78 year old female s/p TAVR on 10/27. Patient was on xarelto prior to admit for history of afib, this was held prior to TAVR and held post-op due to right groin hematoma. Code stroke called this afternoon. Work up is ongoing. New orders received to start low dose heparin per stroke protocol this evening.   Goal of Therapy:  Heparin level 0.3-0.5 units/ml Monitor platelets by anticoagulation protocol: Yes   Plan:  Start heparin infusion at 850 units/hr Check anti-Xa level in 8 hours and daily while on heparin Continue to monitor H&H and platelets  Erin Hearing PharmD., BCPS Clinical Pharmacist 01/23/2019 6:36 PM

## 2019-01-23 NOTE — Progress Notes (Signed)
   VASCULAR SURGERY ASSESSMENT & PLAN:   POD 1 S/P REPAIR OF RIGHT COMMON FEMORAL ARTERY: Patient is doing well status post repair of the right common femoral artery and evacuation of a large hematoma.  Her JP drain 40 cc overnight.  I will leave this for now to be sure that it does not drain more after she is up ambulating.  She has a good posterior tibial signal with the Doppler.  SUBJECTIVE:   No complaints this morning.   PHYSICAL EXAM:   Vitals:   01/23/19 0338 01/23/19 0400 01/23/19 0500 01/23/19 0600  BP:  (!) 116/36 (!) 108/50 (!) 116/42  Pulse:      Resp:  15 13 14   Temp: 98.3 F (36.8 C)     TempSrc: Oral     SpO2:  93% 100% 100%  Weight:   70.3 kg   Height:       Her dressing in the right groin is dry. Her JP drain 40 cc overnight.  LABS:   Lab Results  Component Value Date   WBC 10.6 (H) 01/23/2019   HGB 8.7 (L) 01/23/2019   HCT 26.1 (L) 01/23/2019   MCV 93.2 01/23/2019   PLT 144 (L) 01/23/2019   Lab Results  Component Value Date   CREATININE 0.98 01/23/2019   Lab Results  Component Value Date   INR 1.1 01/22/2019   CBG (last 3)  Recent Labs    01/22/19 2118 01/22/19 2234 01/23/19 0335  GLUCAP 155* 138* 213*    PROBLEM LIST:    Principal Problem:   S/P TAVR (transcatheter aortic valve replacement) Active Problems:   PAF (paroxysmal atrial fibrillation) (HCC)   S/P mitral valve repair   S/P Maze operation for atrial fibrillation   S/P TVR (tricuspid valve repair)   HTN (hypertension)   Severe aortic stenosis   Type 2 diabetes mellitus with complication, with long-term current use of insulin (HCC)   CURRENT MEDS:   . aspirin  81 mg Oral Daily  . busPIRone  10 mg Oral BID  . Chlorhexidine Gluconate Cloth  6 each Topical Daily  . insulin aspart  0-24 Units Subcutaneous TID AC & HS  . mouth rinse  15 mL Mouth Rinse BID  . pantoprazole  40 mg Oral Daily  . pravastatin  40 mg Oral q1800  . sodium chloride flush  3 mL Intravenous Q12H   . venlafaxine XR  150 mg Oral Q breakfast  . venlafaxine XR  75 mg Oral Q breakfast    Deitra Mayo Office: [phone] 01/23/2019

## 2019-01-23 NOTE — Progress Notes (Addendum)
[name]  Patient Name: Rachel Santos Date of Encounter: 01/23/2019  Primary Cardiologist: Dr. Angelena Form / Dr. Angelena Form & Dr. Roxy Manns (TAVR)  Hospital Problem List     Principal Problem:   S/P TAVR (transcatheter aortic valve replacement) Active Problems:   PAF (paroxysmal atrial fibrillation) (HCC)   S/P mitral valve repair   S/P Maze operation for atrial fibrillation   S/P TVR (tricuspid valve repair)   HTN (hypertension)   Severe aortic stenosis   Type 2 diabetes mellitus with complication, with long-term current use of insulin (HCC)   Groin hematoma     Subjective   No complaints. No chest pain or SOB.   Inpatient Medications    Scheduled Meds: . aspirin  81 mg Oral Daily  . busPIRone  10 mg Oral BID  . Chlorhexidine Gluconate Cloth  6 each Topical Daily  . insulin aspart  0-24 Units Subcutaneous TID AC & HS  . mouth rinse  15 mL Mouth Rinse BID  . pantoprazole  40 mg Oral Daily  . pravastatin  40 mg Oral q1800  . sodium chloride flush  3 mL Intravenous Q12H  . venlafaxine XR  150 mg Oral Q breakfast  . venlafaxine XR  75 mg Oral Q breakfast   Continuous Infusions: . sodium chloride 250 mL (01/22/19 2348)  . cefUROXime (ZINACEF)  IV Stopped (01/22/19 1623)  . nitroGLYCERIN    . phenylephrine (NEO-SYNEPHRINE) Adult infusion     PRN Meds: sodium chloride, acetaminophen **OR** acetaminophen, Melatonin, morphine injection, ondansetron (ZOFRAN) IV, oxyCODONE, sodium chloride flush, traMADol   Vital Signs    Vitals:   01/23/19 0700 01/23/19 0800 01/23/19 0900 01/23/19 1000  BP:  (!) 122/54 (!) 124/51   Pulse:      Resp: (!) 23 13 15 13   Temp:      TempSrc:      SpO2: 100% 98% 100% 100%  Weight:      Height:        Intake/Output Summary (Last 24 hours) at 01/23/2019 1115 Last data filed at 01/23/2019 1000 Gross per 24 hour  Intake 1391.9 ml  Output 1235 ml  Net 156.9 ml   Filed Weights   01/22/19 0721 01/23/19 0500  Weight: 69.2 kg 70.3 kg    Physical Exam   GEN: Well nourished, well developed, in no acute distress.  HEENT: Grossly normal.  Neck: Supple, no JVD, carotid bruits, or masses. Cardiac: RRR, soft flow murmur. No rubs, or gallops. No clubbing, cyanosis, edema.   Respiratory:  Respirations regular and unlabored, clear to auscultation bilaterally. GI: Soft, nontender, nondistended, BS + x 4. MS: no deformity or atrophy. Skin: warm and dry, no rash. Right groin with extensive hematoma and ecchymosis. JP drain in place Neuro:  Strength and sensation are intact. Psych: AAOx3.  Normal affect.  Labs    CBC Recent Labs    01/22/19 1903 01/23/19 0328  WBC  --  10.6*  HGB 10.4* 8.7*  HCT 31.7* 26.1*  MCV  --  93.2  PLT  --  123456*   Basic Metabolic Panel Recent Labs    01/22/19 1155 01/23/19 0328  NA 138 134*  K 4.4 4.9  CL 103 105  CO2  --  24  GLUCOSE 152* 227*  BUN 20 25*  CREATININE 0.70 0.98  CALCIUM  --  8.4*  MG  --  1.7   Liver Function Tests No results for input(s): AST, ALT, ALKPHOS,  BILITOT, PROT, ALBUMIN in the last 72 hours. No results for input(s): LIPASE, AMYLASE in the last 72 hours. Cardiac Enzymes No results for input(s): CKTOTAL, CKMB, CKMBINDEX, TROPONINI in the last 72 hours. BNP Invalid input(s): POCBNP D-Dimer No results for input(s): DDIMER in the last 72 hours. Hemoglobin A1C No results for input(s): HGBA1C in the last 72 hours. Fasting Lipid Panel No results for input(s): CHOL, HDL, LDLCALC, TRIG, CHOLHDL, LDLDIRECT in the last 72 hours. Thyroid Function Tests No results for input(s): TSH, T4TOTAL, T3FREE, THYROIDAB in the last 72 hours.  Invalid input(s): FREET3  Telemetry    Sinus with rare PVCs - Personally Reviewed  ECG    Sinus with very subtle P waves HR  61 bpm- Personally Reviewed  Radiology    Dg Chest Port 1 View  Result Date: 01/22/2019 CLINICAL DATA:  Status post TAVR EXAM: PORTABLE CHEST  1 VIEW COMPARISON:  January 18, 2019 FINDINGS: The heart size and mediastinal contours are unchanged. Prior aortic valve replacement is seen. Left atrial appendage clip is noted. Aortic knob calcifications. Overlying median sternotomy wires. A right-sided central venous catheter seen with the tip at the superior cavoatrial junction. No pneumothorax. The lungs are clear. No pleural effusion. IMPRESSION: No acute cardiopulmonary process. Electronically Signed   By: Prudencio Pair M.D.   On: 01/22/2019 15:30    Cardiac Studies   TAVR OPERATIVE NOTE   Date of Procedure:                01/22/2019  Preoperative Diagnosis:      Severe Aortic Stenosis   Postoperative Diagnosis:    Same   Procedure:        Transcatheter Aortic Valve Replacement - Percutaneous Right Transfemoral Approach             Edwards Sapien 3 Ultra THV (size 23 mm, model # 9750TFX, serial # [id number])              Co-Surgeons:                        Valentina Gu. Roxy Manns, MD and Lauree Chandler, MD  Anesthesiologist:                  Laurie Panda, MD  Echocardiographer:              Sanda Klein, MD  Pre-operative Echo Findings: ? Severe aortic stenosis ? Normal left ventricular systolic function  Post-operative Echo Findings: ? Mild paravalvular leak ? Normal left ventricular systolic function  _________________  Echo 01/23/19: completed but pending formal read    Patient Profile     Rachel Santos is a 78 y.o. 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   Assessment & Plan    Severe AS: s/p successful TAVR with a 23 mm Edwards Sapien 3 THV via the TF approach on 01/22/19. Post operative echo pending. Groin sites are stable but right with significant hematoma and ecchymosis (see below). ECG with sinus and no high grade  heart block. Continue Asprin alone. Plan to resume home Xarelto at discharge if okay with vascular. Will transfer to 4E today. Potential discharge home tomorrow.   Right groin hematoma: unfortunately, she developed an extensive right groin hematoma after ambulation last night. She was taken to the  OR for emergent exploration. The hematoma was evacuated. Perclose sutures were partially caught on the inguinal ligament. She underwent successful repair of the right common femoral artery. Blake drain in place. Appreciate vascular assistance.   Acute blood loss anemia: Hg dropped from 10.4--> 8.7 which is expected post operatively and with large hematoma.  HTN: BP well controlled.   Hx of mitral/tricuspid valve ring w/ MAZE (2014): this has been stable by echocardiography  PAF: maintaining sinus rhythm. Holding home Xarelto at this time.   SignedAngelena Form, PA-C  01/23/2019, 11:15 AM  Pager [phone]  I have personally seen and examined this patient. I agree with the assessment and plan as outlined above.  Right groin stable post hematoma evacuation. JP drain in place. BP stable. Echo today to evaluate valve. Likely home tomorrow.   Lauree Chandler 01/23/2019 12:54 PM

## 2019-01-23 NOTE — Significant Event (Addendum)
Rapid Response Event Note  I received a call from nursing staff with concerns of patient having trouble with talking again, difficulty with word finding, and disorientation. I asked that the staff lay the patient down flat and start a NS 500 cc bolus. I also ask that they page the Neuro MD on call as well.  I came to see the patient- patient is now disoriented which she was not earlier. MD called back and I updated him. Neuro MD came to bedside as well. Mild aphasia and dysarthria per MD. NIH 4  Plan: -- Heparin IV as ordered (this was not started as of yet).  -- CT HEAD  -- MRI Routine - Per CARDS ok to go MRI.   Call Time [place], [place]

## 2019-01-23 NOTE — Progress Notes (Signed)
CARDIAC REHAB PHASE I   PRE:  Rate/Rhythm: 68 SR    BP: lying 117/32    SaO2: 98 RA  MODE:  Ambulation: 32 ft   POST:  Rate/Rhythm: 107 ST    BP: sitting 117/45, in bed after pivot 133/53     SaO2: 99 RA  Came to ambulate pt, had not been out of bed yet today. Daughter present. Pt talkative in bed, able to convey events of yesterday although does have short term memory loss at baseline. Pt sat up on EOB with some dizziness but resolved. Able to stand and begin walking with RW and assist x2. She was walking fairly well, just slightly weak, when her right hand started slipping off RW. We paused to check on her and she was unable to express herself clearly. Garbled speech. Sat pt quickly. She slowly was clearer with her speech and thoughts when sitting, although not back to baseline. Rolled pt back to room and took BP, which was normal. She was confused by events, mixing yesterdays events and the present event. She did say her arms felt weak. Stood pt to transfer to bed and pt began struggling to express herself again, garbled speech. She became emotional. Slowly more able to express herself in bed, BP stable. Pt asking what is happening to her. RN present and aware. Called rapid response. Will f/u tomorrow as able.  [place], ACSM 01/23/2019 1:56 PM

## 2019-01-23 NOTE — Code Documentation (Signed)
Code Stroke   Received a call from primary nurse with concerns on activating a Code Stroke, I instructed to nurse to call the emergency line to activate the team and that I would come see the patient.   Per nursing staff, patient had up walking with Cardiac Rehab staff and then difficulty talking and RT sided facial weakness. LSN 1340. Upon arrival, patient was alert and oriented, able to follow all commands, 5/5 in all extremities, no vision or sensory loss NIH 0. Patient for STAT CT HEAD which was negative for acute findings. I was helping the nurse bring the patient back to the room when I was called away to another emergency.  Plan: -- Neuro checks Q2H x 12 HRs -- Please perform bedside swallow as well

## 2019-01-23 NOTE — Progress Notes (Signed)
Called by cardiac rehab nurses to look at pt in hallway after walk. Pt sitting in chair due to sudden weakness, showed sudden right facial drooping, unable to articulate self, garbled speech, and emotional. Returned patient back to room. Pt following commands but unlike herself prior to event. Vitals BP 133/53 (75) NSR and in room air. No complaints of headache, dizziness. Called Rapid Response, initiated Code Stroke and paged neurology. By the time neurologist Dr. Leonie Man arrives at bedside, pt's symptoms subsided (ability to articulate self when asked questions, no garbled speech, aware of change of events ). Received 500 ml NS bolus and went down for CT scan. Stroke swallow eval ordered and will perform q2h neuro checks for 24 hours.    Rachel Santos 01/23/2019 1400h

## 2019-01-23 NOTE — Progress Notes (Signed)
Cardiology Dr. Elson Areas was called to verify if it was safe for the patient to have a MRI Brain tonight since the patient is s/p TAVR 01/22/19. Dr. Elson Areas verbalized that it is ok for the patient to have a MRI Brain tonight.

## 2019-01-23 NOTE — Consult Note (Signed)
Reason for Consult: Code stroke Referring Physician: Chandi Zufall [name] is an 78 y.o. female.  HPI: [name] is a 78 year old pleasant Caucasian lady with past medical history of mitral and tricuspid regurgitation, recurrent paroxysmal A. fib on long-term anticoagulation with Xarelto, s/p mitral valve repair and tricuspid valve repair and maze procedure in 2014, previous TIAs, hypertension, diabetes, coronary artery disease and chronic kidney disease who underwent transcatheter aortic valve replacement procedure 2 days ago.  Procedure was complicated by right groin hematoma yesterday for which she required surgical exploration.  She was off her anticoagulation prior to these 2 procedures. Code stroke was called this morning at 1:35 PM today when patient was noted has sudden onset of difficulty speaking with right facial droop by the bedside RN.  Patient had been fine until then and had been walking up with physical therapy.  Patient symptoms lasted barely less than 5 minutes and by the time the rapid response stroke RN arrived her symptoms were improving.  By the time I saw the patient she was back to baseline and had no focal deficits.  Patient stated she was fully aware of the episode and denied losing consciousness or headache or extremity weakness numbness or balance difficulty. LSN : 135 pm on 01/23/19 IV TPA no as resolved. NIHSS 0 Past Medical History:  Diagnosis Date  . Anxiety   . Arthritis    knees  . Atrial fibrillation ([place])   . Cataracts, bilateral   . Diabetes mellitus   . GERD (gastroesophageal reflux disease)   . History of TIA (transient ischemic attack)    mini stroke with vision problems  . Hypertension   . Mitral regurgitation   . S/P Maze operation for atrial fibrillation 10/11/2012   Complete bilateral atrial lesion set using bipolar radiofrequency and cryothermy ablation with clipping of LA appendage  . 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   1mm Sorin  Memo 3D ring annuloplasty with 26 mm Edwards mc3 tricuspid ring annuloplasty   . 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 Tricuspid regurgitation     Past Surgical History:  Procedure Laterality Date  . ARTERY REPAIR Right 01/22/2019   Procedure: Right Common Femoral Artery Repair;  Surgeon: Angelia Mould, MD;  Location: [facility];  Service: Vascular;  Laterality: Right;  . CARDIAC CATHETERIZATION  09-05-12  . CARDIOVERSION N/A 07/31/2012   Procedure: CARDIOVERSION;  Surgeon: Josue Hector, MD;  Location: [facility];  Service: Cardiovascular;  Laterality: N/A;  . CESAREAN SECTION     x 2  . COLONOSCOPY WITH PROPOFOL N/A 11/28/2014   Procedure: COLONOSCOPY WITH PROPOFOL;  Surgeon: Carol Ada, MD;  Location: [facility];  Service: Endoscopy;  Laterality: N/A;  . FEMORAL ARTERY EXPLORATION Right 01/22/2019   Procedure: RIGHT GROIN EXPLORATION with evacuation of hematoma;  Surgeon: Angelia Mould, MD;  Location: [facility];  Service: Vascular;  Laterality: Right;  . FLEXIBLE SIGMOIDOSCOPY N/A 12/18/2015   Procedure: FLEXIBLE SIGMOIDOSCOPY;  Surgeon: Carol Ada, MD;  Location: [facility];  Service: Endoscopy;  Laterality: N/A;  . INTRAOPERATIVE TRANSESOPHAGEAL ECHOCARDIOGRAM N/A 10/11/2012   Procedure: INTRAOPERATIVE TRANSESOPHAGEAL ECHOCARDIOGRAM;  Surgeon: Rexene Alberts, MD;  Location: [facility];  Service: Open Heart Surgery;  Laterality: N/A;  . MASTECTOMY Left 1982  . MAZE N/A 10/11/2012   Procedure: MAZE;  Surgeon: Rexene Alberts, MD;  Location: [facility];  Service: Open Heart  Surgery;  Laterality: N/A;  . MITRAL VALVE REPAIR N/A 10/11/2012   Procedure: MITRAL VALVE REPAIR (MVR);  Surgeon: Rexene Alberts, MD;  Location: [facility];  Service: Open Heart Surgery;  Laterality: N/A;  . RIGHT HEART CATH AND CORONARY ANGIOGRAPHY N/A 12/24/2018   Procedure: RIGHT HEART CATH AND CORONARY ANGIOGRAPHY;   Surgeon: [name], MD;  Location: [facility];  Service: Cardiovascular;  Laterality: N/A;  . TEE WITHOUT CARDIOVERSION N/A 07/31/2012   Procedure: TRANSESOPHAGEAL ECHOCARDIOGRAM (TEE);  Surgeon: Josue Hector, MD;  Location: [facility];  Service: Cardiovascular;  Laterality: N/A;  . TEE WITHOUT CARDIOVERSION N/A 01/22/2019   Procedure: TRANSESOPHAGEAL ECHOCARDIOGRAM (TEE);  Surgeon: [name], MD;  Location: [facility];  Service: Open Heart Surgery;  Laterality: N/A;  . TRANSCATHETER AORTIC VALVE REPLACEMENT, TRANSFEMORAL N/A 01/22/2019   Procedure: TRANSCATHETER AORTIC VALVE REPLACEMENT, TRANSFEMORAL;  Surgeon: [name], MD;  Location: [facility];  Service: Open Heart Surgery;  Laterality: N/A;  . TRICUSPID VALVE REPLACEMENT N/A 10/11/2012   Procedure: TRICUSPID VALVE REPAIR;  Surgeon: Rexene Alberts, MD;  Location: [facility];  Service: Open Heart Surgery;  Laterality: N/A;  . TUBAL LIGATION      Family History  Problem Relation Age of Onset  . Cancer Father        prostate  . Hypertension Father   . Heart attack Paternal Grandfather   . Stroke Neg Hx     Social History:  reports that she has never smoked. She has never used smokeless tobacco. She reports that she does not drink alcohol or use drugs.  Allergies:  Allergies  Allergen Reactions  . Metformin And Related Diarrhea  . Ultram [Tramadol] Nausea And Vomiting  . Nickel Rash    Pt unable to wear jewelry made of nickel.  . Pneumococcal Vaccines Swelling and Rash    At injection set    Medications: I have reviewed the patient's current medications.  Results for orders placed or performed during the hospital encounter of 01/22/19 (from the past 48 hour(s))  Protime-INR     Status: None   Collection Time: 01/22/19  7:28 AM  Result Value Ref Range   Prothrombin Time 13.6 11.4 - 15.2 seconds   INR 1.1 0.8 - 1.2    Comment: (NOTE) INR goal varies based on  device and disease states. Performed at [facility], [place] [address], [place], [place] [address]   Urinalysis, Routine w reflex microscopic     Status: Abnormal   Collection Time: 01/22/19  7:42 AM  Result Value Ref Range   Color, Urine YELLOW YELLOW   APPearance CLEAR CLEAR   Specific Gravity, Urine 1.010 1.005 - 1.030   pH 6.0 5.0 - 8.0   Glucose, UA NEGATIVE NEGATIVE mg/dL   Hgb urine dipstick NEGATIVE NEGATIVE   Bilirubin Urine NEGATIVE NEGATIVE   Ketones, ur NEGATIVE NEGATIVE mg/dL   Protein, ur NEGATIVE NEGATIVE mg/dL   Nitrite NEGATIVE NEGATIVE   Leukocytes,Ua SMALL (A) NEGATIVE    Comment: Performed at [facility] [address], [place], [place] [address]  Urinalysis, Microscopic (reflex)     Status: Abnormal   Collection Time: 01/22/19  7:42 AM  Result Value Ref Range   RBC / HPF 0-5 0 - 5 RBC/hpf   WBC, UA 6-10 0 - 5 WBC/hpf   Bacteria, UA MANY (A) NONE SEEN   Squamous Epithelial / LPF 0-5 0 - 5   Mucus  PRESENT     Comment: Performed at [facility], [place] [address] [place], [place] [address]  Glucose, capillary     Status: Abnormal   Collection Time: 01/22/19  7:46 AM  Result Value Ref Range   Glucose-Capillary 128 (H) 70 - 99 mg/dL  POCT Activated clotting time     Status: None   Collection Time: 01/22/19  9:21 AM  Result Value Ref Range   Activated Clotting Time 121 seconds  I-STAT, chem 8     Status: Abnormal   Collection Time: 01/22/19  9:22 AM  Result Value Ref Range   Sodium 138 135 - 145 mmol/L   Potassium 4.3 3.5 - 5.1 mmol/L   Chloride 102 98 - 111 mmol/L   BUN 22 8 - 23 mg/dL   Creatinine, Ser 0.90 0.44 - 1.00 mg/dL   Glucose, Bld 130 (H) 70 - 99 mg/dL   Calcium, Ion 1.30 1.15 - 1.40 mmol/L   TCO2 25 22 - 32 mmol/L   Hemoglobin 12.2 12.0 - 15.0 g/dL   HCT 36.0 36.0 - 46.0 %  POCT Activated clotting time     Status: None   Collection Time: 01/22/19 10:10 AM  Result Value Ref Range   Activated Clotting Time 241 seconds   I-STAT, chem 8     Status: Abnormal   Collection Time: 01/22/19 10:11 AM  Result Value Ref Range   Sodium 137 135 - 145 mmol/L   Potassium 4.2 3.5 - 5.1 mmol/L   Chloride 102 98 - 111 mmol/L   BUN 21 8 - 23 mg/dL   Creatinine, Ser 0.70 0.44 - 1.00 mg/dL   Glucose, Bld 133 (H) 70 - 99 mg/dL   Calcium, Ion 1.31 1.15 - 1.40 mmol/L   TCO2 24 22 - 32 mmol/L   Hemoglobin 10.5 (L) 12.0 - 15.0 g/dL   HCT 31.0 (L) 36.0 - 46.0 %  POCT Activated clotting time     Status: None   Collection Time: 01/22/19 10:32 AM  Result Value Ref Range   Activated Clotting Time 92 seconds  I-STAT, chem 8     Status: Abnormal   Collection Time: 01/22/19 11:41 AM  Result Value Ref Range   Sodium 142 135 - 145 mmol/L   Potassium 3.6 3.5 - 5.1 mmol/L   Chloride 108 98 - 111 mmol/L   BUN 18 8 - 23 mg/dL   Creatinine, Ser 0.50 0.44 - 1.00 mg/dL   Glucose, Bld 133 (H) 70 - 99 mg/dL   Calcium, Ion 1.20 1.15 - 1.40 mmol/L   TCO2 20 (L) 22 - 32 mmol/L   Hemoglobin 8.8 (L) 12.0 - 15.0 g/dL   HCT 26.0 (L) 36.0 - 46.0 %  I-STAT, chem 8     Status: Abnormal   Collection Time: 01/22/19 11:55 AM  Result Value Ref Range   Sodium 138 135 - 145 mmol/L   Potassium 4.4 3.5 - 5.1 mmol/L   Chloride 103 98 - 111 mmol/L   BUN 20 8 - 23 mg/dL   Creatinine, Ser 0.70 0.44 - 1.00 mg/dL   Glucose, Bld 152 (H) 70 - 99 mg/dL   Calcium, Ion 1.29 1.15 - 1.40 mmol/L   TCO2 24 22 - 32 mmol/L   Hemoglobin 10.2 (L) 12.0 - 15.0 g/dL   HCT 30.0 (L) 36.0 - 46.0 %  Glucose, capillary     Status: Abnormal   Collection Time: 01/22/19  2:17 PM  Result Value Ref Range   Glucose-Capillary 150 (  H) 70 - 99 mg/dL  Glucose, capillary     Status: Abnormal   Collection Time: 01/22/19  4:28 PM  Result Value Ref Range   Glucose-Capillary 205 (H) 70 - 99 mg/dL  Glucose, capillary     Status: Abnormal   Collection Time: 01/22/19  5:10 PM  Result Value Ref Range   Glucose-Capillary 203 (H) 70 - 99 mg/dL  Hemoglobin and hematocrit, blood      Status: Abnormal   Collection Time: 01/22/19  7:03 PM  Result Value Ref Range   Hemoglobin 10.4 (L) 12.0 - 15.0 g/dL   HCT 31.7 (L) 36.0 - 46.0 %    Comment: Performed at [facility], [place] [address] [place], [place] [address]  Glucose, capillary     Status: Abnormal   Collection Time: 01/22/19  9:18 PM  Result Value Ref Range   Glucose-Capillary 155 (H) 70 - 99 mg/dL  Glucose, capillary     Status: Abnormal   Collection Time: 01/22/19 10:34 PM  Result Value Ref Range   Glucose-Capillary 138 (H) 70 - 99 mg/dL  CBC     Status: Abnormal   Collection Time: 01/23/19  3:28 AM  Result Value Ref Range   WBC 10.6 (H) 4.0 - 10.5 K/uL   RBC 2.80 (L) 3.87 - 5.11 MIL/uL   Hemoglobin 8.7 (L) 12.0 - 15.0 g/dL   HCT 26.1 (L) 36.0 - 46.0 %   MCV 93.2 80.0 - 100.0 fL   MCH 31.1 26.0 - 34.0 pg   MCHC 33.3 30.0 - 36.0 g/dL   RDW 12.8 11.5 - 15.5 %   Platelets 144 (L) 150 - 400 K/uL   nRBC 0.0 0.0 - 0.2 %    Comment: Performed at [facility], [place] [address] [place], [place] [id number]  Basic metabolic panel     Status: Abnormal   Collection Time: 01/23/19  3:28 AM  Result Value Ref Range   Sodium 134 (L) 135 - 145 mmol/L   Potassium 4.9 3.5 - 5.1 mmol/L   Chloride 105 98 - 111 mmol/L   CO2 24 22 - 32 mmol/L   Glucose, Bld 227 (H) 70 - 99 mg/dL   BUN 25 (H) 8 - 23 mg/dL   Creatinine, Ser 0.98 0.44 - 1.00 mg/dL   Calcium 8.4 (L) 8.9 - 10.3 mg/dL   GFR calc non Af Amer 55 (L) >60 mL/min   GFR calc Af Amer >60 >60 mL/min   Anion gap 5 5 - 15    Comment: Performed at [facility] [address] [place], [place] [address]  Magnesium     Status: None   Collection Time: 01/23/19  3:28 AM  Result Value Ref Range   Magnesium 1.7 1.7 - 2.4 mg/dL    Comment: Performed at [facility] [address] [place], [place] [address]  Troponin I (High Sensitivity)     Status: Abnormal   Collection Time: 01/23/19  3:28 AM  Result Value Ref Range   Troponin I (High Sensitivity)  93 (H) <18 ng/L    Comment: (NOTE) Elevated high sensitivity troponin I (hsTnI) values and significant  changes across serial measurements may suggest ACS but many other  chronic and acute conditions are known to elevate hsTnI results.  Refer to the "Links" section for chest pain algorithms and additional  guidance. Performed at [facility], [place] [address] [place], [place] [address]   Glucose, capillary  Status: Abnormal   Collection Time: 01/23/19  3:35 AM  Result Value Ref Range   Glucose-Capillary 213 (H) 70 - 99 mg/dL  Glucose, capillary     Status: Abnormal   Collection Time: 01/23/19 11:46 AM  Result Value Ref Range   Glucose-Capillary 221 (H) 70 - 99 mg/dL  Glucose, capillary     Status: Abnormal   Collection Time: 01/23/19  2:04 PM  Result Value Ref Range   Glucose-Capillary 168 (H) 70 - 99 mg/dL    Dg Chest Port 1 View  Result Date: 01/22/2019 CLINICAL DATA:  Status post TAVR EXAM: PORTABLE CHEST 1 VIEW COMPARISON:  January 18, 2019 FINDINGS: The heart size and mediastinal contours are unchanged. Prior aortic valve replacement is seen. Left atrial appendage clip is noted. Aortic knob calcifications. Overlying median sternotomy wires. A right-sided central venous catheter seen with the tip at the superior cavoatrial junction. No pneumothorax. The lungs are clear. No pleural effusion. IMPRESSION: No acute cardiopulmonary process. Electronically Signed   By: Prudencio Pair M.D.   On: 01/22/2019 15:30   Ct Head Code Stroke Wo Contrast  Result Date: 01/23/2019 CLINICAL DATA:  Code stroke.  Speech disturbance. Weakness. EXAM: CT HEAD WITHOUT CONTRAST TECHNIQUE: Contiguous axial images were obtained from the base of the skull through the vertex without intravenous contrast. COMPARISON:  11/07/2018 FINDINGS: Brain: Mild age related atrophy. No evidence of old or acute focal infarction, mass lesion, hemorrhage, hydrocephalus or extra-axial collection. Vascular: There is  atherosclerotic calcification of the major vessels at the base of the brain. No sign of acute hyperdense vessel. Skull: Negative Sinuses/Orbits: Clear/normal Other: None ASPECTS ([place] Stroke Program Early CT Score) - Ganglionic level infarction (caudate, lentiform nuclei, internal capsule, insula, M1-M3 cortex): 7 - Supraganglionic infarction (M4-M6 cortex): 3 Total score (0-10 with 10 being normal): 10 IMPRESSION: No acute finding by CT. Mild age related atrophy. Atherosclerotic calcification of the major vessels at the base of the brain. *ASPECTS is 10. * These results were communicated to Dr. Leonie Man at 2:26 pmon 10/28/2020by text page via the [facility] messaging system. Electronically Signed   By: Nelson Chimes M.D.   On: 01/23/2019 14:28   CT-scan of the brain no acute abnormality. Mild atrophy MRI examination of the brain. pending Carotid ultrasound 01/02/2019 ; bilateral 1-39% carotid stenosis Transthoracic echo today: Normal left ventricular ejection fraction 65 to 70%.  Normal valvular function Hemoglobin A1c 01/18/2019 7.0 Lipid profile pending  ROS  Positive for speech difficulties, facial droop and all other symptoms negative Blood pressure (!) 133/53, pulse 68, temperature 98.2 F (36.8 C), temperature source Oral, resp. rate 18, height 5\' 6"  (1.676 m), weight 70.3 kg, SpO2 100 %. Physical Exam Pleasant elderly not in distress. . Afebrile. Head is nontraumatic. Neck is supple without bruit.    Cardiac exam no murmur or gallop. Lungs are clear to auscultation. Distal pulses are well felt. Neurological Exam ;  Awake  Alert oriented x 3. Normal speech and language.eye movements full without nystagmus.fundi were not visualized. Vision acuity and fields appear normal. Hearing is normal. Palatal movements are normal. Face symmetric. Tongue midline. Normal strength, tone, reflexes and coordination. Normal sensation. Gait deferred.  NIHSS 0 Assessment/Plan: 77 year old Caucasian lady with sudden  onset transient episode of slurred speech facial droop likely TIA of cardioembolic etiology from known chronic atrial fibrillation while off anticoagulation for TAVR procedure 2 days ago.  She also had right groin hematoma yesterday for which she required surgical intervention. Patient remains at high risk  for recurrent strokes and TIAs and will need to be restarted on anticoagulation but the risk-benefit will have to be balanced given her recent groin hematoma and surgery yesterday.  Will need to discuss this with her cardiologist Dr. Angelena Form and vascular surgeon Dr. Doren Custard.  Recommend IV normal saline 20 cc now.  Check lipid profile.  Resume oral anticoagulation with Xarelto or Eliquis if safe from vascular surgery standpoint. Long discussion with patient and daughter at the bedside and answered questions.  Discussed with Dr. Angelena Form. This patient is critically ill and at significant risk of neurological worsening, death and care requires constant monitoring of vital signs, hemodynamics,respiratory and cardiac monitoring, extensive review of multiple databases, frequent neurological assessment, discussion with family, other specialists and medical decision making of high complexity.I have made any additions or clarifications directly to the above note.This critical care time does not reflect procedure time, or teaching time or supervisory time of PA/NP/Med Resident etc but could involve care discussion time.  I spent 40 minutes of neurocritical care time  in the care of  this patient.     Antony Contras 01/23/2019, 2:34 PM    Note: This document was prepared with digital dictation and possible smart phrase technology. Any transcriptional errors that result from this process are unintentional.

## 2019-01-23 NOTE — Anesthesia Postprocedure Evaluation (Signed)
Anesthesia Post Note  Patient: Cierria Height Salameh  Procedure(s) Performed: TRANSCATHETER AORTIC VALVE REPLACEMENT, TRANSFEMORAL (N/A ) TRANSESOPHAGEAL ECHOCARDIOGRAM (TEE) (N/A )     Patient location during evaluation: Cath Lab Anesthesia Type: MAC Level of consciousness: awake and alert Pain management: pain level controlled Vital Signs Assessment: post-procedure vital signs reviewed and stable Respiratory status: spontaneous breathing, nonlabored ventilation, respiratory function stable and patient connected to nasal cannula oxygen Cardiovascular status: stable Postop Assessment: no apparent nausea or vomiting Anesthetic complications: no    Last Vitals:  Vitals:   01/23/19 0900 01/23/19 1000  BP: (!) 124/51   Pulse:    Resp: 15 13  Temp:    SpO2: 100% 100%    Last Pain:  Vitals:   01/23/19 0800  TempSrc:   PainSc: 0-No pain                 Genise Strack

## 2019-01-23 NOTE — Progress Notes (Signed)
  Echocardiogram 2D Echocardiogram has been performed.  Jennette Dubin 01/23/2019, 10:30 AM

## 2019-01-24 ENCOUNTER — Encounter (HOSPITAL_COMMUNITY): Payer: Self-pay | Admitting: *Deleted

## 2019-01-24 ENCOUNTER — Inpatient Hospital Stay (HOSPITAL_COMMUNITY): Payer: Medicare Other

## 2019-01-24 DIAGNOSIS — I639 Cerebral infarction, unspecified: Secondary | ICD-10-CM

## 2019-01-24 DIAGNOSIS — Z952 Presence of prosthetic heart valve: Secondary | ICD-10-CM | POA: Diagnosis not present

## 2019-01-24 DIAGNOSIS — I480 Paroxysmal atrial fibrillation: Secondary | ICD-10-CM

## 2019-01-24 DIAGNOSIS — S301XXD Contusion of abdominal wall, subsequent encounter: Secondary | ICD-10-CM | POA: Diagnosis not present

## 2019-01-24 HISTORY — DX: Cerebral infarction, unspecified: I63.9

## 2019-01-24 LAB — BASIC METABOLIC PANEL
Anion gap: 7 (ref 5–15)
BUN: 26 mg/dL — ABNORMAL HIGH (ref 8–23)
CO2: 20 mmol/L — ABNORMAL LOW (ref 22–32)
Calcium: 8 mg/dL — ABNORMAL LOW (ref 8.9–10.3)
Chloride: 111 mmol/L (ref 98–111)
Creatinine, Ser: 0.93 mg/dL (ref 0.44–1.00)
GFR calc Af Amer: >60 mL/min (ref >60)
GFR calc non Af Amer: 58 mL/min — ABNORMAL LOW (ref >60)
Glucose, Bld: 154 mg/dL — ABNORMAL HIGH (ref 70–99)
Potassium: 3.9 mmol/L (ref 3.5–5.1)
Sodium: 138 mmol/L (ref 135–145)

## 2019-01-24 LAB — CBC
HCT: 20.6 % — ABNORMAL LOW (ref 36.0–46.0)
Hemoglobin: 6.9 g/dL — CL (ref 12.0–15.0)
MCH: 30.9 pg (ref 26.0–34.0)
MCHC: 33.5 g/dL (ref 30.0–36.0)
MCV: 92.4 fL (ref 80.0–100.0)
Platelets: 97 10*3/uL — ABNORMAL LOW (ref 150–400)
RBC: 2.23 MIL/uL — ABNORMAL LOW (ref 3.87–5.11)
RDW: 13.2 % (ref 11.5–15.5)
WBC: 7.6 10*3/uL (ref 4.0–10.5)
nRBC: 0 % (ref 0.0–0.2)

## 2019-01-24 LAB — APTT: aPTT: 73 seconds — ABNORMAL HIGH (ref 24–36)

## 2019-01-24 LAB — GLUCOSE, CAPILLARY
Glucose-Capillary: 155 mg/dL — ABNORMAL HIGH (ref 70–99)
Glucose-Capillary: 170 mg/dL — ABNORMAL HIGH (ref 70–99)
Glucose-Capillary: 169 mg/dL — ABNORMAL HIGH (ref 70–99)
Glucose-Capillary: 125 mg/dL — ABNORMAL HIGH (ref 70–99)
Glucose-Capillary: 148 mg/dL — ABNORMAL HIGH (ref 70–99)
Glucose-Capillary: 147 mg/dL — ABNORMAL HIGH (ref 70–99)

## 2019-01-24 LAB — HEMOGLOBIN AND HEMATOCRIT, BLOOD
HCT: 25.3 % — ABNORMAL LOW (ref 36.0–46.0)
Hemoglobin: 8.2 g/dL — ABNORMAL LOW (ref 12.0–15.0)

## 2019-01-24 LAB — PREPARE RBC (CROSSMATCH)

## 2019-01-24 LAB — HEPARIN LEVEL (UNFRACTIONATED)
Heparin Unfractionated: 0.42 IU/mL (ref 0.30–0.70)
Heparin Unfractionated: 0.54 IU/mL (ref 0.30–0.70)

## 2019-01-24 MED ORDER — SODIUM CHLORIDE 0.9% IV SOLUTION: Freq: Once | INTRAVENOUS | Status: AC

## 2019-01-24 MED ORDER — SODIUM CHLORIDE 0.9% FLUSH
10.0000 mL | Freq: Two times a day (BID) | INTRAVENOUS | Status: DC
  Administered 2019-01-24 – 2019-01-27 (×5): 10 mL

## 2019-01-24 MED ORDER — PHENYLEPHRINE HCL-NACL 10-0.9 MG/250ML-% IV SOLN
INTRAVENOUS | Status: AC
  Filled 2019-01-24: qty 250

## 2019-01-24 MED ORDER — SODIUM CHLORIDE 0.9% FLUSH: 10.0000 mL | INTRAVENOUS | Status: DC | PRN

## 2019-01-24 MED FILL — Heparin Sodium (Porcine) Inj 1000 Unit/ML: INTRAMUSCULAR | Qty: 30 | Status: AC

## 2019-01-24 MED FILL — Magnesium Sulfate Inj 50%: INTRAMUSCULAR | Qty: 10 | Status: AC

## 2019-01-24 MED FILL — Potassium Chloride Inj 2 mEq/ML: INTRAVENOUS | Qty: 40 | Status: AC

## 2019-01-24 NOTE — Progress Notes (Addendum)
TCTS BRIEF SICU PROGRESS NOTE  Events of last 24 hours noted and discussed w/ patient's daughter at bedside  Plan: Continue current plan outlined by Drs Rosezella Rumpf and Leonie Man.  Will need PT/OT/SLP evaluations  Rexene Alberts, MD 01/24/2019 12:39 PM

## 2019-01-24 NOTE — Progress Notes (Signed)
[place] for heparin Indication: atrial fibrillation and stroke protocol  Allergies  Allergen Reactions  . Metformin And Related Diarrhea  . Ultram [Tramadol] Nausea And Vomiting  . Nickel Rash    Pt unable to wear jewelry made of nickel.  . Pneumococcal Vaccines Swelling and Rash    At injection set    Patient Measurements: Height: 5\' 6"  (167.6 cm) Weight: 154 lb 15.7 oz (70.3 kg) IBW/kg (Calculated) : 59.3 Heparin Dosing Weight: 69kg  Vital Signs: Temp: 98.1 F (36.7 C) (10/29 1511) Temp Source: Oral (10/29 1511) BP: 131/53 (10/29 1300) Pulse Rate: 71 (10/29 0900)  Labs: Recent Labs    01/22/19 0728  01/22/19 1155  01/23/19 0328 01/24/19 0346 01/24/19 1304 01/24/19 1645  HGB  --    < > 10.2*   < > 8.7* 6.9* 8.2*  --   HCT  --    < > 30.0*   < > 26.1* 20.6* 25.3*  --   PLT  --   --   --   --  144* 97*  --   --   APTT  --   --   --   --   --  73*  --   --   LABPROT 13.6  --   --   --   --   --   --   --   INR 1.1  --   --   --   --   --   --   --   HEPARINUNFRC  --   --   --   --   --  0.42  --  0.54  CREATININE  --    < > 0.70  --  0.98 0.93  --   --   TROPONINIHS  --   --   --   --  93*  --   --   --    < > = values in this interval not displayed.    Estimated Creatinine Clearance: 45.9 mL/min (by C-G formula based on SCr of 0.93 mg/dL).   Medical History: Past Medical History:  Diagnosis Date  . Anxiety   . Arthritis    knees  . Atrial fibrillation ([place])   . Cataracts, bilateral   . Diabetes mellitus   . GERD (gastroesophageal reflux disease)   . History of TIA (transient ischemic attack)    mini stroke with vision problems  . Hypertension   . Mitral regurgitation   . S/P Maze operation for atrial fibrillation 10/11/2012   Complete bilateral atrial lesion set using bipolar radiofrequency and cryothermy ablation with clipping of LA appendage  . 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   61mm Sorin Memo 3D ring  annuloplasty with 26 mm Edwards mc3 tricuspid ring annuloplasty   . 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 Tricuspid regurgitation    Assessment: 78 year old female s/p TAVR on 10/27. Patient was on xarelto prior to admit for history of afib, this was held prior to TAVR and held post-op due to right groin hematoma. Code stroke called 10/28. Work up is ongoing. New orders received to start low dose heparin per stroke protocol this evening.   Heparin level now slightly above goal at 0.54 (CVA protocol).  Goal of Therapy:  Heparin level 0.3-0.5 units/ml Monitor platelets by anticoagulation protocol: Yes   Plan:  -Reduce heparin  to 800 units/hr -Recheck heparin level with morning labs   Arrie Senate, PharmD, BCPS Clinical Pharmacist [phone] Please check AMION for all [place] numbers 01/24/2019

## 2019-01-24 NOTE — Progress Notes (Signed)
Noted CVA and need for blood today. Asked RN for PT order. Will f/u after they eval. Yves Dill CES, ACSM 2:38 PM 01/24/2019

## 2019-01-24 NOTE — Progress Notes (Addendum)
[facility] VALVE TEAM  Patient Name: Rachel Santos Date of Encounter: 01/24/2019  Primary Cardiologist: Dr. Angelena Form / Dr. Angelena Form & Dr. Roxy Manns (TAVR)  Hospital Problem List     Principal Problem:   S/P TAVR (transcatheter aortic valve replacement) Active Problems:   PAF (paroxysmal atrial fibrillation) (HCC)   S/P mitral valve repair   S/P Maze operation for atrial fibrillation   S/P TVR (tricuspid valve repair)   HTN (hypertension)   Severe aortic stenosis   Type 2 diabetes mellitus with complication, with long-term current use of insulin (HCC)   Groin hematoma   Acute CVA (cerebrovascular accident) ([place])     Subjective   Sleepy. Unable to tell me where she is, what year it is, or who the president is. She is able to identify her daughter, Rachel Santos, in the room correctly.   Inpatient Medications    Scheduled Meds:  sodium chloride   Intravenous Once   aspirin  81 mg Oral Daily   busPIRone  10 mg Oral BID   Chlorhexidine Gluconate Cloth  6 each Topical Daily   insulin aspart  0-24 Units Subcutaneous TID AC & HS   mouth rinse  15 mL Mouth Rinse BID   pantoprazole  40 mg Oral Daily   pravastatin  40 mg Oral q1800   sodium chloride flush  3 mL Intravenous Q12H   venlafaxine XR  150 mg Oral Q breakfast   venlafaxine XR  75 mg Oral Q breakfast   Continuous Infusions:  sodium chloride 250 mL (01/22/19 2348)   cefUROXime (ZINACEF)  IV 1.5 g (01/24/19 0539)   heparin 850 Units/hr (01/24/19 0400)   PRN Meds: sodium chloride, acetaminophen **OR** acetaminophen, Melatonin, morphine injection, ondansetron (ZOFRAN) IV, oxyCODONE, sodium chloride flush, traMADol   Vital Signs    Vitals:   01/24/19 0650 01/24/19 0700 01/24/19 0715 01/24/19 0747  BP:  (!) 134/42 (!) 129/49   Pulse:      Resp:  (!) 23 17   Temp: 98.7 F (37.1 C)   98.7 F (37.1 C)  TempSrc: Axillary   Oral  SpO2:  93% 94%   Weight:       Height:        Intake/Output Summary (Last 24 hours) at 01/24/2019 0842 Last data filed at 01/24/2019 0700 Gross per 24 hour  Intake 1876.91 ml  Output 1435 ml  Net 441.91 ml   Filed Weights   01/22/19 0721 01/23/19 0500  Weight: 69.2 kg 70.3 kg    Physical Exam   GEN: Well nourished, well developed, in no acute distress.  HEENT: Grossly normal.  Neck: Supple, no JVD, carotid bruits, or masses. Cardiac: RRR, soft flow murmur. No rubs, or gallops. No clubbing, cyanosis, edema.   Respiratory:  Respirations regular and unlabored, clear to auscultation bilaterally. GI: Soft, nontender, nondistended, BS + x 4. MS: no deformity or atrophy. Skin: warm and dry, no rash. Right groin with extensive hematoma and ecchymosis. JP drain in place Neuro:  Strength and sensation are intact. Patient has waxing and waning mental status Psych: AAOx1.    Labs    CBC Recent Labs    01/23/19 0328 01/24/19 0346  WBC 10.6* 7.6  HGB 8.7* 6.9*  HCT 26.1* 20.6*  MCV 93.2 92.4  PLT 144* 97*   Basic Metabolic Panel Recent Labs    01/23/19 0328 01/24/19 0346  NA 134* 138  K 4.9 3.9  CL 105 111  CO2 24  20*  GLUCOSE 227* 154*  BUN 25* 26*  CREATININE 0.98 0.93  CALCIUM 8.4* 8.0*  MG 1.7  --    Liver Function Tests No results for input(s): AST, ALT, ALKPHOS, BILITOT, PROT, ALBUMIN in the last 72 hours. No results for input(s): LIPASE, AMYLASE in the last 72 hours. Cardiac Enzymes No results for input(s): CKTOTAL, CKMB, CKMBINDEX, TROPONINI in the last 72 hours. BNP Invalid input(s): POCBNP D-Dimer No results for input(s): DDIMER in the last 72 hours. Hemoglobin A1C No results for input(s): HGBA1C in the last 72 hours. Fasting Lipid Panel Recent Labs    01/23/19 1530  CHOL 112  HDL 38*  LDLCALC 44  TRIG 149  CHOLHDL 2.9   Thyroid Function Tests No results for input(s): TSH, T4TOTAL, T3FREE, THYROIDAB in the last 72 hours.  Invalid input(s): FREET3  Telemetry    Sinus  with rare PVCs - Personally Reviewed  ECG    Sinus with very subtle P waves HR  61 bpm- Personally Reviewed  Radiology    Ct Head Wo Contrast  Result Date: 01/23/2019 CLINICAL DATA:  Speech difficulty, aphasia. EXAM: CT HEAD WITHOUT CONTRAST TECHNIQUE: Contiguous axial images were obtained from the base of the skull through the vertex without intravenous contrast. COMPARISON:  Head CT performed earlier the same day 01/23/2019, brain MRI/MRA 09/27/2012 FINDINGS: Brain: No evidence of acute intracranial hemorrhage. No demarcated cortical infarction. No evidence of intracranial mass. No midline shift or extra-axial fluid collection. Mild generalized parenchymal atrophy. Vascular: No definite hyperdense vessel. Atherosclerotic calcification of the carotid artery siphons and vertebrobasilar system. Skull: Normal. Negative for fracture or focal lesion. Sinuses/Orbits: Visualized orbits demonstrate no acute abnormality. No significant paranasal sinus disease or mastoid effusion. IMPRESSION: 1. No CT evidence of acute intracranial abnormality. 2. Mild generalized parenchymal atrophy. Electronically Signed   By: Kellie Simmering DO   On: 01/23/2019 21:27   Mr Angio Head Wo Contrast  Result Date: 01/24/2019 CLINICAL DATA:  Right facial droop and sudden onset difficulty speaking. EXAM: MRI HEAD WITHOUT CONTRAST MRA HEAD WITHOUT CONTRAST TECHNIQUE: Multiplanar, multiecho pulse sequences of the brain and surrounding structures were obtained without intravenous contrast. Angiographic images of the head were obtained using MRA technique without contrast. COMPARISON:  None. FINDINGS: MRI HEAD FINDINGS BRAIN: There are scattered punctate foci of acute ischemia, predominantly within the left hemisphere, but there are at least 2 sites in the right hemisphere. The greatest concentration of lesions is along the left precentral gyrus. There also lesions in the left parietal lobe and left cerebellum. The midline structures are  normal. Minimal white matter hyperintensity, nonspecific and commonly seen in asymptomatic patients of this age. The CSF spaces are normal for age, with no hydrocephalus. Susceptibility-sensitive sequences show no chronic microhemorrhage or superficial siderosis. SKULL AND UPPER CERVICAL SPINE: The visualized skull base, calvarium, upper cervical spine and extracranial soft tissues are normal. SINUSES/ORBITS: No fluid levels or advanced mucosal thickening. No mastoid or middle ear effusion. The orbits are normal. MRA HEAD FINDINGS POSTERIOR CIRCULATION: --Basilar artery: Normal. --Posterior cerebral arteries: Normal. Both originate from the basilar artery. There is a left P-comm. Right P-comm is diminutive or absent. --Superior cerebellar arteries: Normal. --Inferior cerebellar arteries: Normal anterior and posterior inferior cerebellar arteries. ANTERIOR CIRCULATION: --Intracranial internal carotid arteries: Mild atherosclerotic narrowing. --Anterior cerebral arteries: Normal. Both A1 segments are present. Patent anterior communicating artery. --Middle cerebral arteries: Normal. IMPRESSION: 1. Multifocal acute ischemia scattered within both hemispheres, left-greater-than-right, and in the left cerebellum. The pattern is most consistent  with a central embolic source, particularly in a patient with a history of atrial fibrillation. 2. No emergent large vessel occlusion or high-grade stenosis. 3. Mild intracranial atherosclerosis. Electronically Signed   By: Ulyses Jarred M.D.   On: 01/24/2019 03:12   Mr Brain Wo Contrast  Result Date: 01/24/2019 CLINICAL DATA:  Right facial droop and sudden onset difficulty speaking. EXAM: MRI HEAD WITHOUT CONTRAST MRA HEAD WITHOUT CONTRAST TECHNIQUE: Multiplanar, multiecho pulse sequences of the brain and surrounding structures were obtained without intravenous contrast. Angiographic images of the head were obtained using MRA technique without contrast. COMPARISON:  None.  FINDINGS: MRI HEAD FINDINGS BRAIN: There are scattered punctate foci of acute ischemia, predominantly within the left hemisphere, but there are at least 2 sites in the right hemisphere. The greatest concentration of lesions is along the left precentral gyrus. There also lesions in the left parietal lobe and left cerebellum. The midline structures are normal. Minimal white matter hyperintensity, nonspecific and commonly seen in asymptomatic patients of this age. The CSF spaces are normal for age, with no hydrocephalus. Susceptibility-sensitive sequences show no chronic microhemorrhage or superficial siderosis. SKULL AND UPPER CERVICAL SPINE: The visualized skull base, calvarium, upper cervical spine and extracranial soft tissues are normal. SINUSES/ORBITS: No fluid levels or advanced mucosal thickening. No mastoid or middle ear effusion. The orbits are normal. MRA HEAD FINDINGS POSTERIOR CIRCULATION: --Basilar artery: Normal. --Posterior cerebral arteries: Normal. Both originate from the basilar artery. There is a left P-comm. Right P-comm is diminutive or absent. --Superior cerebellar arteries: Normal. --Inferior cerebellar arteries: Normal anterior and posterior inferior cerebellar arteries. ANTERIOR CIRCULATION: --Intracranial internal carotid arteries: Mild atherosclerotic narrowing. --Anterior cerebral arteries: Normal. Both A1 segments are present. Patent anterior communicating artery. --Middle cerebral arteries: Normal. IMPRESSION: 1. Multifocal acute ischemia scattered within both hemispheres, left-greater-than-right, and in the left cerebellum. The pattern is most consistent with a central embolic source, particularly in a patient with a history of atrial fibrillation. 2. No emergent large vessel occlusion or high-grade stenosis. 3. Mild intracranial atherosclerosis. Electronically Signed   By: Ulyses Jarred M.D.   On: 01/24/2019 03:12   Dg Chest Port 1 View  Result Date: 01/22/2019 CLINICAL DATA:   Status post TAVR EXAM: PORTABLE CHEST 1 VIEW COMPARISON:  January 18, 2019 FINDINGS: The heart size and mediastinal contours are unchanged. Prior aortic valve replacement is seen. Left atrial appendage clip is noted. Aortic knob calcifications. Overlying median sternotomy wires. A right-sided central venous catheter seen with the tip at the superior cavoatrial junction. No pneumothorax. The lungs are clear. No pleural effusion. IMPRESSION: No acute cardiopulmonary process. Electronically Signed   By: Prudencio Pair M.D.   On: 01/22/2019 15:30   Ct Head Code Stroke Wo Contrast  Result Date: 01/23/2019 CLINICAL DATA:  Code stroke.  Speech disturbance. Weakness. EXAM: CT HEAD WITHOUT CONTRAST TECHNIQUE: Contiguous axial images were obtained from the base of the skull through the vertex without intravenous contrast. COMPARISON:  11/07/2018 FINDINGS: Brain: Mild age related atrophy. No evidence of old or acute focal infarction, mass lesion, hemorrhage, hydrocephalus or extra-axial collection. Vascular: There is atherosclerotic calcification of the major vessels at the base of the brain. No sign of acute hyperdense vessel. Skull: Negative Sinuses/Orbits: Clear/normal Other: None ASPECTS ([facility] Stroke Program Early CT Score) - Ganglionic level infarction (caudate, lentiform nuclei, internal capsule, insula, M1-M3 cortex): 7 - Supraganglionic infarction (M4-M6 cortex): 3 Total score (0-10 with 10 being normal): 10 IMPRESSION: No acute finding by CT. Mild age related atrophy. Atherosclerotic calcification  of the major vessels at the base of the brain. *ASPECTS is 10. * These results were communicated to Dr. Leonie Man at 2:26 pmon 10/28/2020by text page via the [facility] messaging system. Electronically Signed   By: Nelson Chimes M.D.   On: 01/23/2019 14:28    Cardiac Studies   TAVR OPERATIVE NOTE   Date of Procedure:                01/22/2019  Preoperative Diagnosis:      Severe Aortic Stenosis   Postoperative  Diagnosis:    Same   Procedure:        Transcatheter Aortic Valve Replacement - Percutaneous Right Transfemoral Approach             Edwards Sapien 3 Ultra THV (size 23 mm, model # 9750TFX, serial # [id number])              Co-Surgeons:                        Valentina Gu. Roxy Manns, MD and Lauree Chandler, MD  Anesthesiologist:                  Laurie Panda, MD  Echocardiographer:              Sanda Klein, MD  Pre-operative Echo Findings: ? Severe aortic stenosis ? Normal left ventricular systolic function  Post-operative Echo Findings: ? Mild paravalvular leak ? Normal left ventricular systolic function  _________________  Echo 01/23/19: completed but pending formal read    Patient Profile     Edel Degrave Catarino is a 78 y.o. 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   Assessment & Plan    Severe AS: s/p successful TAVR with a 23 mm Edwards Sapien 3 THV via the TF approach on 01/22/19. Post operative echo showed EF 65-70%, normally functioning TAVR with mean gradient of 13 mm Hg and mild PVL. Groin sites are stable but right with significant hematoma and ecchymosis (see below). ECG with sinus and no high grade heart block. Continue Asprin alone until vascular clears [place] to resume home xarelto.  Right groin hematoma: unfortunately, she developed an extensive right groin hematoma after ambulation last night. She was taken to the OR for emergent exploration. The hematoma was evacuated. Perclose sutures were partially caught on the inguinal ligament. She underwent successful repair of the right common femoral artery. Blake drain in place. Appreciate vascular assistance.   Acute blood loss anemia: Hg dropped from 10.4--> 6.9 likely from extensive blood loss from groin dissection an hematoma. Will  transfuse 1 U PRBC now.   Acute CVA: patient with TIA yesterday afternoon and then waxing and waning mental status, facial droop and aphagia overnight felt to be from an acute ischemic CVA. tPA not administered due to right groin hematoma s/p surgical intervention. Repeat CT head showed no new hemorrhage or large stroke.  She was started on a heparin drip. Subsequent MRA showed multifocal acute ischemia scattered within both hemispheres, left-greater-than-right, and in the left cerebellum. The pattern is most consistent with a central embolic source, particularly in a patient with a history of atrial fibrillation.  HTN: BP well controlled.   Hx of mitral/tricuspid valve ring w/ MAZE (2014): this has been stable by echocardiography  PAF: maintaining sinus  rhythm. Holding home Xarelto given recent large right groin hematoma. Now on IV heparin with acute CVA.   Mable Fill, PA-C  01/24/2019, 8:42 AM  Pager [phone]  I have personally seen and examined this patient. I agree with the assessment and plan as outlined above.  She is two days post TAVR. Her AVR is working well. Right groin is stable. VVS following. Evidence of stroke yesterday by MRI. Plan to restart Xarelto prior to discharge. She is now on IV heparin. It is presumed that her stroke is due to embolic event from the valve deployment and not from her PAF. She has been in sinus the whole hospitalization. Will follow recs of Neuro team regarding post stroke care.  She is more anemic today post groin hematoma and evacuation. Will transfuse today.   Lauree Chandler 01/24/2019 10:28 AM

## 2019-01-24 NOTE — Progress Notes (Signed)
CRITICAL VALUE ALERT  Critical Value:  Hemoglobin 6.9  Date & Time Notied: 01/24/2019 0439  Provider Notified: Dr. Elson Areas  Orders Received/Actions taken: No new orders recieved

## 2019-01-24 NOTE — Progress Notes (Signed)
STROKE TEAM PROGRESS NOTE   INTERVAL HISTORY Her daughter is at the bedside.  Rachel Santos has had a least 3 episodes of sudden onset of transient difficulty speaking with right facial droop since yesterday.  Rachel Santos has been started on IV heparin drip.  Hematocrit has dropped and Rachel Santos is getting blood transfusion.  MRI scan confirmed small embolic strokes likely related to her A. fib  Vitals:   01/24/19 0700 01/24/19 0715 01/24/19 0747 01/24/19 0900  BP: (!) 134/42 (!) 129/49  (!) 121/40  Pulse:    71  Resp: (!) 23 17  15   Temp:   98.7 F (37.1 C) 98.7 F (37.1 C)  TempSrc:   Oral Oral  SpO2: 93% 94%  98%  Weight:      Height:        CBC:  Recent Labs  Lab 01/23/19 0328 01/24/19 0346  WBC 10.6* 7.6  HGB 8.7* 6.9*  HCT 26.1* 20.6*  MCV 93.2 92.4  PLT 144* 97*    Basic Metabolic Panel:  Recent Labs  Lab 01/23/19 0328 01/24/19 0346  NA 134* 138  K 4.9 3.9  CL 105 111  CO2 24 20*  GLUCOSE 227* 154*  BUN 25* 26*  CREATININE 0.98 0.93  CALCIUM 8.4* 8.0*  MG 1.7  --    Lipid Panel:     Component Value Date/Time   CHOL 112 01/23/2019 1530   TRIG 149 01/23/2019 1530   HDL 38 (L) 01/23/2019 1530   CHOLHDL 2.9 01/23/2019 1530   VLDL 30 01/23/2019 1530   LDLCALC 44 01/23/2019 1530   HgbA1c:  Lab Results  Component Value Date   HGBA1C 7.0 (H) 01/18/2019   Urine Drug Screen: No results found for: LABOPIA, COCAINSCRNUR, LABBENZ, AMPHETMU, THCU, LABBARB  Alcohol Level No results found for: ETH  IMAGING Ct Head Wo Contrast  Result Date: 01/23/2019 CLINICAL DATA:  Speech difficulty, aphasia. EXAM: CT HEAD WITHOUT CONTRAST TECHNIQUE: Contiguous axial images were obtained from the base of the skull through the vertex without intravenous contrast. COMPARISON:  Head CT performed earlier the same day 01/23/2019, brain MRI/MRA 09/27/2012 FINDINGS: Brain: No evidence of acute intracranial hemorrhage. No demarcated cortical infarction. No evidence of intracranial mass. No midline shift  or extra-axial fluid collection. Mild generalized parenchymal atrophy. Vascular: No definite hyperdense vessel. Atherosclerotic calcification of the carotid artery siphons and vertebrobasilar system. Skull: Normal. Negative for fracture or focal lesion. Sinuses/Orbits: Visualized orbits demonstrate no acute abnormality. No significant paranasal sinus disease or mastoid effusion. IMPRESSION: 1. No CT evidence of acute intracranial abnormality. 2. Mild generalized parenchymal atrophy. Electronically Signed   By: Kellie Simmering DO   On: 01/23/2019 21:27   Mr Angio Head Wo Contrast  Result Date: 01/24/2019 CLINICAL DATA:  Right facial droop and sudden onset difficulty speaking. EXAM: MRI HEAD WITHOUT CONTRAST MRA HEAD WITHOUT CONTRAST TECHNIQUE: Multiplanar, multiecho pulse sequences of the brain and surrounding structures were obtained without intravenous contrast. Angiographic images of the head were obtained using MRA technique without contrast. COMPARISON:  None. FINDINGS: MRI HEAD FINDINGS BRAIN: There are scattered punctate foci of acute ischemia, predominantly within the left hemisphere, but there are at least 2 sites in the right hemisphere. The greatest concentration of lesions is along the left precentral gyrus. There also lesions in the left parietal lobe and left cerebellum. The midline structures are normal. Minimal white matter hyperintensity, nonspecific and commonly seen in asymptomatic patients of this age. The CSF spaces are normal for age, with no hydrocephalus. Susceptibility-sensitive  sequences show no chronic microhemorrhage or superficial siderosis. SKULL AND UPPER CERVICAL SPINE: The visualized skull base, calvarium, upper cervical spine and extracranial soft tissues are normal. SINUSES/ORBITS: No fluid levels or advanced mucosal thickening. No mastoid or middle ear effusion. The orbits are normal. MRA HEAD FINDINGS POSTERIOR CIRCULATION: --Basilar artery: Normal. --Posterior cerebral  arteries: Normal. Both originate from the basilar artery. There is a left P-comm. Right P-comm is diminutive or absent. --Superior cerebellar arteries: Normal. --Inferior cerebellar arteries: Normal anterior and posterior inferior cerebellar arteries. ANTERIOR CIRCULATION: --Intracranial internal carotid arteries: Mild atherosclerotic narrowing. --Anterior cerebral arteries: Normal. Both A1 segments are present. Patent anterior communicating artery. --Middle cerebral arteries: Normal. IMPRESSION: 1. Multifocal acute ischemia scattered within both hemispheres, left-greater-than-right, and in the left cerebellum. The pattern is most consistent with a central embolic source, particularly in a patient with a history of atrial fibrillation. 2. No emergent large vessel occlusion or high-grade stenosis. 3. Mild intracranial atherosclerosis. Electronically Signed   By: Ulyses Jarred M.D.   On: 01/24/2019 03:12   Mr Brain Wo Contrast  Result Date: 01/24/2019 CLINICAL DATA:  Right facial droop and sudden onset difficulty speaking. EXAM: MRI HEAD WITHOUT CONTRAST MRA HEAD WITHOUT CONTRAST TECHNIQUE: Multiplanar, multiecho pulse sequences of the brain and surrounding structures were obtained without intravenous contrast. Angiographic images of the head were obtained using MRA technique without contrast. COMPARISON:  None. FINDINGS: MRI HEAD FINDINGS BRAIN: There are scattered punctate foci of acute ischemia, predominantly within the left hemisphere, but there are at least 2 sites in the right hemisphere. The greatest concentration of lesions is along the left precentral gyrus. There also lesions in the left parietal lobe and left cerebellum. The midline structures are normal. Minimal white matter hyperintensity, nonspecific and commonly seen in asymptomatic patients of this age. The CSF spaces are normal for age, with no hydrocephalus. Susceptibility-sensitive sequences show no chronic microhemorrhage or superficial  siderosis. SKULL AND UPPER CERVICAL SPINE: The visualized skull base, calvarium, upper cervical spine and extracranial soft tissues are normal. SINUSES/ORBITS: No fluid levels or advanced mucosal thickening. No mastoid or middle ear effusion. The orbits are normal. MRA HEAD FINDINGS POSTERIOR CIRCULATION: --Basilar artery: Normal. --Posterior cerebral arteries: Normal. Both originate from the basilar artery. There is a left P-comm. Right P-comm is diminutive or absent. --Superior cerebellar arteries: Normal. --Inferior cerebellar arteries: Normal anterior and posterior inferior cerebellar arteries. ANTERIOR CIRCULATION: --Intracranial internal carotid arteries: Mild atherosclerotic narrowing. --Anterior cerebral arteries: Normal. Both A1 segments are present. Patent anterior communicating artery. --Middle cerebral arteries: Normal. IMPRESSION: 1. Multifocal acute ischemia scattered within both hemispheres, left-greater-than-right, and in the left cerebellum. The pattern is most consistent with a central embolic source, particularly in a patient with a history of atrial fibrillation. 2. No emergent large vessel occlusion or high-grade stenosis. 3. Mild intracranial atherosclerosis. Electronically Signed   By: Ulyses Jarred M.D.   On: 01/24/2019 03:12   Dg Chest Port 1 View  Result Date: 01/22/2019 CLINICAL DATA:  Status post TAVR EXAM: PORTABLE CHEST 1 VIEW COMPARISON:  January 18, 2019 FINDINGS: The heart size and mediastinal contours are unchanged. Prior aortic valve replacement is seen. Left atrial appendage clip is noted. Aortic knob calcifications. Overlying median sternotomy wires. A right-sided central venous catheter seen with the tip at the superior cavoatrial junction. No pneumothorax. The lungs are clear. No pleural effusion. IMPRESSION: No acute cardiopulmonary process. Electronically Signed   By: Prudencio Pair M.D.   On: 01/22/2019 15:30   Ct Head Code Stroke Wo  Contrast  Result Date:  01/23/2019 CLINICAL DATA:  Code stroke.  Speech disturbance. Weakness. EXAM: CT HEAD WITHOUT CONTRAST TECHNIQUE: Contiguous axial images were obtained from the base of the skull through the vertex without intravenous contrast. COMPARISON:  11/07/2018 FINDINGS: Brain: Mild age related atrophy. No evidence of old or acute focal infarction, mass lesion, hemorrhage, hydrocephalus or extra-axial collection. Vascular: There is atherosclerotic calcification of the major vessels at the base of the brain. No sign of acute hyperdense vessel. Skull: Negative Sinuses/Orbits: Clear/normal Other: None ASPECTS ([place] Stroke Program Early CT Score) - Ganglionic level infarction (caudate, lentiform nuclei, internal capsule, insula, M1-M3 cortex): 7 - Supraganglionic infarction (M4-M6 cortex): 3 Total score (0-10 with 10 being normal): 10 IMPRESSION: No acute finding by CT. Mild age related atrophy. Atherosclerotic calcification of the major vessels at the base of the brain. *ASPECTS is 10. * These results were communicated to Dr. Leonie Man at 2:26 pmon 10/28/2020by text page via the [facility] messaging system. Electronically Signed   By: Nelson Chimes M.D.   On: 01/23/2019 14:28    Physical Exam Pleasant elderly not in distress. . Afebrile. Head is nontraumatic. Neck is supple without bruit.    Cardiac exam no murmur or gallop. Lungs are clear to auscultation. Distal pulses are well felt. Neurological Exam ;  Awake  Alert oriented x 2.  Mild expressive aphasia with nonfluent speech with some word finding difficulties and paraphasic errors.  Able to comprehend and one-step and occasional two-step commands..eye movements full without nystagmus.fundi were not visualized. Vision acuity and fields appear normal. Hearing is normal. Palatal movements are normal. Face symmetric. Tongue midline. Normal strength, tone, reflexes and coordination. Normal sensation. Gait deferred ASSESSMENT/PLAN Rachel Santos is a 78 y.o. female with  history of mitral and tricuspid regurgitation, recurrent paroxysmal A. 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  Procedure was complicated by right groin hematoma 10/27 which Rachel Santos required surgical exploration.  Rachel Santos was off her anticoagulation prior to these 2 procedures. Code stroke was called 10/28 at 1:35 PM today when patient was noted has sudden onset of difficulty speaking with right facial droop by the RN. Rachel Santos was not a tPA or IR candidate.   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 Small vessel disease. ASPECTS 10.     Repeat CT head no acute abnormality, small vessel disease.   MRI  L>R parietal and L cerebellar infarcts.   MRA  No LVO. Mild intracranial atherosclerosis.  Carotid Doppler 01/02/2019 no significant bilateral extracranial stenosis.  2D Echo EF 65-70%. No source of embolus   LDL 44  HgbA1c 7.0  IV hpearin for VTE prophylaxis  Xarelto (rivaroxaban) daily prior to admission, now on aspirin 81 mg daily and heparin IV.   Therapy recommendations:  Pending. Added OT and SLP.  Disposition:  pending   Atrial Fibrillation  Home anticoagulation:  Xarelto (rivaroxaban) daily   Hx Maze 2014  xarelto on hold x 2 days d/t post op groin hematoma  Now on IV heparin    Last INR 1.1 . Continue Xarelto (rivaroxaban) daily or Eliquis at discharge    Hypertension  UStable . BP goal normotensive  Hyperlipidemia  Home meds:  mevacor 40, resumed in hospital  LDL 44, goal < 70  Continue statin at discharge  Diabetes type II Controlled  HgbA1c 7.0, goal < 7.0  Other Stroke Risk Factors  Advanced age  Hx TIAs  Coronary  artery disease  MVR and TR s/p repair in 2014  Aortic valve replacement (TAVR) 01/22/2019  Other Active Problems  CKD  R groin hematoma s/p TAVR s/p OR for evacuation w/ femoral repair  Acute blood loss anemia 10.4-6.9. transfused  Hospital day # 2 Continue IV heparin till the right groin hematoma is stable enough for the patient to be restarted back on oral anticoagulation like Xarelto or Eliquis to be determined by cardiology for her A. fib.  Long discussion with the patient and daughter at the bedside and answered questions.  Greater than 50% time during this 25-minute visit was spent on counseling and coordination of care about her aphasia and embolic strokes and discussion about risk benefit of anticoagulation and bleeding and stroke prevention.  Stroke team will sign off.  Kindly call for questions Antony Contras   To contact Stroke Continuity provider, please refer to [email or web address] After hours, contact General Neurology

## 2019-01-24 NOTE — Progress Notes (Signed)
   VASCULAR SURGERY ASSESSMENT & PLAN:   POD 2 S/P REPAIR OF RIGHT COMMON FEMORAL ARTERY: Patient is doing well status post repair of the right common femoral artery and evacuation of a large hematoma.  Her JP drain 35 cc overnight.    Will discontinue JP.  Okay to ambulate from vascular surgery standpoint.  TIA: The patient apparently has had some confusion and also some episodes of expressive aphasia.  I reviewed her carotid duplex scan that was done on 01/02/2019.  This showed no significant carotid disease bilaterally.  SUBJECTIVE:   No complaints this morning.  PHYSICAL EXAM:   Vitals:   01/24/19 0700 01/24/19 0715 01/24/19 0747 01/24/19 0900  BP: (!) 134/42 (!) 129/49  (!) 121/40  Pulse:    71  Resp: (!) 23 17  15   Temp:   98.7 F (37.1 C) 98.7 F (37.1 C)  TempSrc:   Oral Oral  SpO2: 93% 94%  98%  Weight:      Height:       Dressing on her right groin is dry. Right foot is warm and well-perfused. JP drain 35 cc last shift.  LABS:   Lab Results  Component Value Date   WBC 7.6 01/24/2019   HGB 6.9 (LL) 01/24/2019   HCT 20.6 (L) 01/24/2019   MCV 92.4 01/24/2019   PLT 97 (L) 01/24/2019   Lab Results  Component Value Date   CREATININE 0.93 01/24/2019   CBG (last 3)  Recent Labs    01/23/19 2353 01/24/19 0644 01/24/19 0749  GLUCAP 200* 155* 170*    PROBLEM LIST:    Principal Problem:   S/P TAVR (transcatheter aortic valve replacement) Active Problems:   PAF (paroxysmal atrial fibrillation) (HCC)   S/P mitral valve repair   S/P Maze operation for atrial fibrillation   S/P TVR (tricuspid valve repair)   HTN (hypertension)   Severe aortic stenosis   Type 2 diabetes mellitus with complication, with long-term current use of insulin (HCC)   Groin hematoma   Acute CVA (cerebrovascular accident) ([place])   CURRENT MEDS:   . sodium chloride   Intravenous Once  . aspirin  81 mg Oral Daily  . busPIRone  10 mg Oral BID  . Chlorhexidine Gluconate Cloth  6  each Topical Daily  . insulin aspart  0-24 Units Subcutaneous TID AC & HS  . mouth rinse  15 mL Mouth Rinse BID  . pantoprazole  40 mg Oral Daily  . pravastatin  40 mg Oral q1800  . sodium chloride flush  3 mL Intravenous Q12H  . venlafaxine XR  150 mg Oral Q breakfast  . venlafaxine XR  75 mg Oral Q breakfast    Deitra Mayo Office: [phone] 01/24/2019

## 2019-01-24 NOTE — Progress Notes (Signed)
[place] for heparin Indication: atrial fibrillation and stroke protocol  Allergies  Allergen Reactions  . Metformin And Related Diarrhea  . Ultram [Tramadol] Nausea And Vomiting  . Nickel Rash    Pt unable to wear jewelry made of nickel.  . Pneumococcal Vaccines Swelling and Rash    At injection set    Patient Measurements: Height: 5\' 6"  (167.6 cm) Weight: 154 lb 15.7 oz (70.3 kg) IBW/kg (Calculated) : 59.3 Heparin Dosing Weight: 69kg  Vital Signs: Temp: 98.7 F (37.1 C) (10/29 0650) Temp Source: Axillary (10/29 0650) BP: 129/49 (10/29 0715) Pulse Rate: 58 (10/29 0300)  Labs: Recent Labs    01/22/19 0728  01/22/19 1155 01/22/19 1903 01/23/19 0328 01/24/19 0346  HGB  --    < > 10.2* 10.4* 8.7* 6.9*  HCT  --    < > 30.0* 31.7* 26.1* 20.6*  PLT  --   --   --   --  144* 97*  APTT  --   --   --   --   --  73*  LABPROT 13.6  --   --   --   --   --   INR 1.1  --   --   --   --   --   HEPARINUNFRC  --   --   --   --   --  0.42  CREATININE  --    < > 0.70  --  0.98 0.93  TROPONINIHS  --   --   --   --  93*  --    < > = values in this interval not displayed.    Estimated Creatinine Clearance: 45.9 mL/min (by C-G formula based on SCr of 0.93 mg/dL).   Medical History: Past Medical History:  Diagnosis Date  . Anxiety   . Arthritis    knees  . Atrial fibrillation ([place])   . Cataracts, bilateral   . Diabetes mellitus   . GERD (gastroesophageal reflux disease)   . History of TIA (transient ischemic attack)    mini stroke with vision problems  . Hypertension   . Mitral regurgitation   . S/P Maze operation for atrial fibrillation 10/11/2012   Complete bilateral atrial lesion set using bipolar radiofrequency and cryothermy ablation with clipping of LA appendage  . 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   71mm Sorin Memo 3D ring annuloplasty with 26 mm Edwards mc3 tricuspid ring annuloplasty   . S/P TAVR (transcatheter aortic valve  replacement) 01/22/2019   s/p TAVR with a 23 Edwards Sapien 3 Ultra THV via the TF approach  . Tricuspid regurgitation    Assessment: 78 year old female s/p TAVR on 10/27. Patient was on xarelto prior to admit for history of afib, this was held prior to TAVR and held post-op due to right groin hematoma. Code stroke called 10/28. Work up is ongoing. New orders received to start low dose heparin per stroke protocol this evening.   APTT and heparin level are therapeutic at 0.42, on 850 units/hr - correlating. Hgb 6.9, plt 97. Will continue to monitor with heparin levels. No s/sx of bleeding or infusion issues.   Goal of Therapy:  Heparin level 0.3-0.5 units/ml Monitor platelets by anticoagulation protocol: Yes   Plan:  Continue heparin infusion at 850 units/hr Check anti-Xa level in 8 hours and daily while on heparin Continue to monitor H&H and platelets  Antonietta Jewel, PharmD, BCCCP Clinical Pharmacist  Phone: [phone]  Please check AMION  for all [place] phone numbers After 10:00 PM, call [place] [phone] 01/24/2019 7:35 AM

## 2019-01-24 NOTE — Progress Notes (Addendum)
EVENING ROUNDS NOTE :     [address]       [place] [address]             [phone]                 2 Days Post-Op Procedure(s) (LRB): RIGHT GROIN EXPLORATION with evacuation of hematoma (Right) Right Common Femoral Artery Repair (Right)  Total Length of Stay:  LOS: 2 days  BP (!) 131/53 (BP Location: Right Arm)   Pulse 71   Temp 98.1 F (36.7 C) (Oral)   Resp (!) 21   Ht 5\' 6"  (1.676 m)   Wt 70.3 kg   SpO2 95%   BMI 25.01 kg/m   .Intake/Output      10/28 0701 - 10/29 0700 10/29 0701 - 10/30 0700   P.O. 600 150   I.V. (mL/kg) 591.9 (8.4) 51 (0.7)   Other 725    IV Piggyback 200 0   Total Intake(mL/kg) 2116.9 (30.1) 201 (2.9)   Urine (mL/kg/hr) 1400 (0.8) 550 (0.7)   Drains 35 20   Blood     Total Output 1435 570   Net +681.9 -369          . sodium chloride 250 mL (01/22/19 2348)  . heparin 800 Units/hr (01/24/19 1818)     Lab Results  Component Value Date   WBC 7.6 01/24/2019   HGB 8.2 (L) 01/24/2019   HCT 25.3 (L) 01/24/2019   PLT 97 (L) 01/24/2019   GLUCOSE 154 (H) 01/24/2019   CHOL 112 01/23/2019   TRIG 149 01/23/2019   HDL 38 (L) 01/23/2019   LDLCALC 44 01/23/2019   ALT 21 01/18/2019   AST 24 01/18/2019   NA 138 01/24/2019   K 3.9 01/24/2019   CL 111 01/24/2019   CREATININE 0.93 01/24/2019   BUN 26 (H) 01/24/2019   CO2 20 (L) 01/24/2019   TSH 4.100 09/21/2016   INR 1.1 01/22/2019   HGBA1C 7.0 (H) 01/18/2019  Patient eating pudding, daughter at bedside. Patient without complaints Maintaining SR, on room air Acute CVA-on Heparin drip. Await PT/OT/speech evaluations Ecchymosis right perineum/groin Received PRBC earlier today Management per cardiology, neurology  Lars Pinks PA-C 01/24/2019 6:33 PM    I have seen and examined the patient and agree with the assessment and plan as outlined.  Looks much better this evening.  Neuro status seems at or near baseline.  Rexene Alberts, MD 01/24/2019 9:44 PM

## 2019-01-25 DIAGNOSIS — Z952 Presence of prosthetic heart valve: Secondary | ICD-10-CM | POA: Diagnosis not present

## 2019-01-25 DIAGNOSIS — S301XXD Contusion of abdominal wall, subsequent encounter: Secondary | ICD-10-CM | POA: Diagnosis not present

## 2019-01-25 DIAGNOSIS — I480 Paroxysmal atrial fibrillation: Secondary | ICD-10-CM | POA: Diagnosis not present

## 2019-01-25 DIAGNOSIS — I639 Cerebral infarction, unspecified: Secondary | ICD-10-CM | POA: Diagnosis not present

## 2019-01-25 LAB — URINALYSIS, ROUTINE W REFLEX MICROSCOPIC
Bacteria, UA: NONE SEEN
Bilirubin Urine: NEGATIVE
Glucose, UA: >=500 mg/dL — AB
Ketones, ur: NEGATIVE mg/dL
Leukocytes,Ua: NEGATIVE
Nitrite: NEGATIVE
Protein, ur: 30 mg/dL — AB
Specific Gravity, Urine: 1.026 (ref 1.005–1.030)
pH: 5 (ref 5.0–8.0)

## 2019-01-25 LAB — BASIC METABOLIC PANEL
Anion gap: 7 (ref 5–15)
BUN: 13 mg/dL (ref 8–23)
CO2: 23 mmol/L (ref 22–32)
Calcium: 8.1 mg/dL — ABNORMAL LOW (ref 8.9–10.3)
Chloride: 108 mmol/L (ref 98–111)
Creatinine, Ser: 0.72 mg/dL (ref 0.44–1.00)
GFR calc Af Amer: >60 mL/min (ref >60)
GFR calc non Af Amer: >60 mL/min (ref >60)
Glucose, Bld: 132 mg/dL — ABNORMAL HIGH (ref 70–99)
Potassium: 3.8 mmol/L (ref 3.5–5.1)
Sodium: 138 mmol/L (ref 135–145)

## 2019-01-25 LAB — GLUCOSE, CAPILLARY
Glucose-Capillary: 134 mg/dL — ABNORMAL HIGH (ref 70–99)
Glucose-Capillary: 198 mg/dL — ABNORMAL HIGH (ref 70–99)
Glucose-Capillary: 241 mg/dL — ABNORMAL HIGH (ref 70–99)
Glucose-Capillary: 217 mg/dL — ABNORMAL HIGH (ref 70–99)
Glucose-Capillary: 171 mg/dL — ABNORMAL HIGH (ref 70–99)

## 2019-01-25 LAB — CBC
HCT: 23.8 % — ABNORMAL LOW (ref 36.0–46.0)
Hemoglobin: 8.1 g/dL — ABNORMAL LOW (ref 12.0–15.0)
MCH: 30.8 pg (ref 26.0–34.0)
MCHC: 34 g/dL (ref 30.0–36.0)
MCV: 90.5 fL (ref 80.0–100.0)
Platelets: 98 10*3/uL — ABNORMAL LOW (ref 150–400)
RBC: 2.63 MIL/uL — ABNORMAL LOW (ref 3.87–5.11)
RDW: 13.7 % (ref 11.5–15.5)
WBC: 8.7 10*3/uL (ref 4.0–10.5)
nRBC: 0 % (ref 0.0–0.2)

## 2019-01-25 LAB — HEPARIN LEVEL (UNFRACTIONATED): Heparin Unfractionated: 0.33 IU/mL (ref 0.30–0.70)

## 2019-01-25 MED ORDER — POLYETHYLENE GLYCOL 3350 17 G PO PACK
17.0000 g | PACK | Freq: Every day | ORAL | Status: DC
  Filled 2019-01-25 (×3): qty 1

## 2019-01-25 MED ORDER — RIVAROXABAN 15 MG PO TABS
15.0000 mg | ORAL_TABLET | Freq: Every day | ORAL | Status: DC
  Administered 2019-01-25 – 2019-01-28 (×4): 15 mg via ORAL
  Filled 2019-01-25 (×5): qty 1

## 2019-01-25 MED ORDER — PHENYLEPHRINE HCL-NACL 10-0.9 MG/250ML-% IV SOLN
INTRAVENOUS | Status: AC
  Filled 2019-01-25: qty 250

## 2019-01-25 NOTE — Progress Notes (Signed)
EVENING ROUNDS NOTE :     [address]       [place] [address]             [phone]                 3 Days Post-Op Procedure(s) (LRB): RIGHT GROIN EXPLORATION with evacuation of hematoma (Right) Right Common Femoral Artery Repair (Right)   Total Length of Stay:  LOS: 3 days  Events:  Doing well. Ambulated with cardiac rehab today    BP (!) 127/51   Pulse 77   Temp 99.5 F (37.5 C) (Oral)   Resp (!) 26   Ht 5\' 6"  (1.676 m)   Wt 69 kg   SpO2 98%   BMI 24.55 kg/m         . sodium chloride Stopped (01/25/19 1142)    I/O last 3 completed shifts: In: 1098.5 [P.O.:370; I.V.:628.5; IV Piggyback:99.9] Out: 3305 [Urine:3250; Drains:55]   CBC Latest Ref Rng & Units 01/25/2019 01/24/2019 01/24/2019  WBC 4.0 - 10.5 K/uL 8.7 - 7.6  Hemoglobin 12.0 - 15.0 g/dL 8.1(L) 8.2(L) 6.9(LL)  Hematocrit 36.0 - 46.0 % 23.8(L) 25.3(L) 20.6(L)  Platelets 150 - 400 K/uL 98(L) - 97(L)    BMP Latest Ref Rng & Units 01/25/2019 01/24/2019 01/23/2019  Glucose 70 - 99 mg/dL 132(H) 154(H) 227(H)  BUN 8 - 23 mg/dL 13 26(H) 25(H)  Creatinine 0.44 - 1.00 mg/dL 0.72 0.93 0.98  BUN/Creat Ratio 12 - 28 - - -  Sodium 135 - 145 mmol/L 138 138 134(L)  Potassium 3.5 - 5.1 mmol/L 3.8 3.9 4.9  Chloride 98 - 111 mmol/L 108 111 105  CO2 22 - 32 mmol/L 23 20(L) 24  Calcium 8.9 - 10.3 mg/dL 8.1(L) 8.0(L) 8.4(L)    ABG    Component Value Date/Time   PHART 7.467 (H) 01/18/2019 0956   PCO2ART 28.5 (L) 01/18/2019 0956   PO2ART 112 (H) 01/18/2019 0956   HCO3 20.3 01/18/2019 0956   TCO2 24 01/22/2019 1155   ACIDBASEDEF 2.8 (H) 01/18/2019 0956   O2SAT 98.6 01/18/2019 0956       Melodie Bouillon, MD 01/25/2019 4:32 PM

## 2019-01-25 NOTE — Progress Notes (Signed)
Pt tx from [facility]. VSS. Telemetry applied. CHG complete. Pt oriented to room and unit. Call light in reach. Will continue to monitor.  Arletta Bale, RN

## 2019-01-25 NOTE — Plan of Care (Signed)
  Problem: Education: Goal: Knowledge of General Education information will improve Description Including pain rating scale, medication(s)/side effects and non-pharmacologic comfort measures Outcome: Progressing   Problem: Health Behavior/Discharge Planning: Goal: Ability to manage health-related needs will improve Outcome: Progressing   

## 2019-01-25 NOTE — Evaluation (Signed)
Speech Language Pathology Evaluation Patient Details Name: Rachel Santos MRN: [id number] DOB: 08-04-1940 Today's Date: 01/25/2019 Time: [id number] SLP Time Calculation (min) (ACUTE ONLY): 32 min  Problem List:  Patient Active Problem List   Diagnosis Date Noted  . Acute CVA (cerebrovascular accident) ([place]) 01/24/2019  . Groin hematoma 01/23/2019  . 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  . Type 2 diabetes mellitus with complication, with long-term current use of insulin ([place])   . Severe aortic stenosis   . HTN (hypertension) 08/13/2013  . Insomnia 08/13/2013  . S/P TVR (tricuspid valve repair) 05/27/2013  . Long term (current) use of anticoagulants 10/25/2012  . 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  . S/P Maze operation for atrial fibrillation 10/11/2012  . PAF (paroxysmal atrial fibrillation) ([place]) 07/03/2012   Past Medical History:  Past Medical History:  Diagnosis Date  . Anxiety   . Arthritis    knees  . Atrial fibrillation ([place])   . Cataracts, bilateral   . Diabetes mellitus   . GERD (gastroesophageal reflux disease)   . History of TIA (transient ischemic attack)    mini stroke with vision problems  . Hypertension   . Mitral regurgitation   . S/P Maze operation for atrial fibrillation 10/11/2012   Complete bilateral atrial lesion set using bipolar radiofrequency and cryothermy ablation with clipping of LA appendage  . 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   79mm Sorin Memo 3D ring annuloplasty with 26 mm Edwards mc3 tricuspid ring annuloplasty   . 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 Tricuspid regurgitation    Past Surgical History:  Past Surgical History:  Procedure Laterality Date  . ARTERY REPAIR Right 01/22/2019   Procedure: Right Common Femoral Artery Repair;  Surgeon: Angelia Mould, MD;  Location: [place];  Service: Vascular;  Laterality:  Right;  . CARDIAC CATHETERIZATION  09-05-12  . CARDIOVERSION N/A 07/31/2012   Procedure: CARDIOVERSION;  Surgeon: Josue Hector, MD;  Location: [place];  Service: Cardiovascular;  Laterality: N/A;  . CESAREAN SECTION     x 2  . COLONOSCOPY WITH PROPOFOL N/A 11/28/2014   Procedure: COLONOSCOPY WITH PROPOFOL;  Surgeon: Carol Ada, MD;  Location: [facility];  Service: Endoscopy;  Laterality: N/A;  . FEMORAL ARTERY EXPLORATION Right 01/22/2019   Procedure: RIGHT GROIN EXPLORATION with evacuation of hematoma;  Surgeon: Angelia Mould, MD;  Location: [facility] OR;  Service: Vascular;  Laterality: Right;  . FLEXIBLE SIGMOIDOSCOPY N/A 12/18/2015   Procedure: FLEXIBLE SIGMOIDOSCOPY;  Surgeon: Carol Ada, MD;  Location: [facility];  Service: Endoscopy;  Laterality: N/A;  . INTRAOPERATIVE TRANSESOPHAGEAL ECHOCARDIOGRAM N/A 10/11/2012   Procedure: INTRAOPERATIVE TRANSESOPHAGEAL ECHOCARDIOGRAM;  Surgeon: Rexene Alberts, MD;  Location: [place];  Service: Open Heart Surgery;  Laterality: N/A;  . MASTECTOMY Left 1982  . MAZE N/A 10/11/2012   Procedure: MAZE;  Surgeon: Rexene Alberts, MD;  Location: [place];  Service: Open Heart Surgery;  Laterality: N/A;  . MITRAL VALVE REPAIR N/A 10/11/2012   Procedure: MITRAL VALVE REPAIR (MVR);  Surgeon: Rexene Alberts, MD;  Location: [place];  Service: Open Heart Surgery;  Laterality: N/A;  . RIGHT HEART CATH AND CORONARY ANGIOGRAPHY N/A 12/24/2018   Procedure: RIGHT HEART CATH AND CORONARY ANGIOGRAPHY;  Surgeon: [name], MD;  Location: [place] CV [facility];  Service: Cardiovascular;  Laterality: N/A;  . TEE WITHOUT CARDIOVERSION N/A 07/31/2012   Procedure:  TRANSESOPHAGEAL ECHOCARDIOGRAM (TEE);  Surgeon: Josue Hector, MD;  Location: [place];  Service: Cardiovascular;  Laterality: N/A;  . TEE WITHOUT CARDIOVERSION N/A 01/22/2019   Procedure: TRANSESOPHAGEAL ECHOCARDIOGRAM (TEE);  Surgeon: [name], MD;  Location: [place] [facility];   Service: Open Heart Surgery;  Laterality: N/A;  . TRANSCATHETER AORTIC VALVE REPLACEMENT, TRANSFEMORAL N/A 01/22/2019   Procedure: TRANSCATHETER AORTIC VALVE REPLACEMENT, TRANSFEMORAL;  Surgeon: [name], MD;  Location: [place] [facility];  Service: Open Heart Surgery;  Laterality: N/A;  . TRICUSPID VALVE REPLACEMENT N/A 10/11/2012   Procedure: TRICUSPID VALVE REPAIR;  Surgeon: Rexene Alberts, MD;  Location: [place];  Service: Open Heart Surgery;  Laterality: N/A;  . TUBAL LIGATION     HPI:  78 y.o. 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 Pt underwent R groin hematoma evacuation and R common femoral artery repair 10/27. Pt also experiencing 3 episodes of sudden difficulty speaking with R facial droop, MRI demonstrating multiple small embolic strokes. Pt with PMH of a-fib, CVA, GERD, DM.   Assessment / Plan / Recommendation Clinical Impression  Pt reportedly has had a decline in her attention and word-finding skills over the last few years, but more pronounced over the last six months per her daughter. Today she is disoriented to time and cannot tell me her birthday without cues. She is 90% accurate with confrontational naming but can only produce one word in one minute with divergent naming (and one incorrect word). She is highly distractible, which seems to impact her thought organization, command following, and problem solving.   Of note, pt reported an acute onset of visual disturbances during this evaluation, described as "wavy lines." No other focal neurological changes were noted, but pt's daughter said that she was also much more confused than she was earlier this morning. RN made aware and came to assess. Given current presentation, would recommend [facility] SLP s/u with 24/7 supervision upon discharge.     SLP Assessment  SLP Recommendation/Assessment: Patient needs continued Speech Lanaguage Pathology Services SLP Visit Diagnosis: Cognitive communication  deficit (R41.841)    Follow Up Recommendations  Home health SLP;24 hour supervision/assistance    Frequency and Duration min 2x/week  2 weeks      SLP Evaluation Cognition  Overall Cognitive Status: Impaired/Different from baseline Arousal/Alertness: Awake/alert Orientation Level: Oriented to person;Oriented to place;Disoriented to time;Oriented to situation Attention: Sustained Sustained Attention: Impaired Sustained Attention Impairment: Verbal basic;Functional basic Memory: Impaired Memory Impairment: Decreased recall of new information Awareness: Impaired Awareness Impairment: Anticipatory impairment Problem Solving: Impaired Problem Solving Impairment: Verbal basic Safety/Judgment: Impaired       Comprehension  Auditory Comprehension Overall Auditory Comprehension: Impaired Commands: Impaired One Step Basic Commands: 75-100% accurate Conversation: Simple Interfering Components: Attention    Expression Expression Primary Mode of Expression: Verbal Verbal Expression Overall Verbal Expression: Impaired Initiation: No impairment Level of Generative/Spontaneous Verbalization: Sentence Naming: Impairment Confrontation: Impaired(90% accurate) Divergent: 0-24% accurate Verbal Errors: Other (comment)(word-finding errors, circumlocution) Interfering Components: Attention Non-Verbal Means of Communication: Not applicable   Oral / Motor  Oral Motor/Sensory Function Overall Oral Motor/Sensory Function: Within functional limits Motor Speech Overall Motor Speech: Appears within functional limits for tasks assessed   GO                    Venita Sheffield Dejana Pugsley 01/25/2019, 1:28 PM   Pollyann Glen, M.A. [facility] Acute Environmental education officer [phone] Office [phone]

## 2019-01-25 NOTE — Progress Notes (Signed)
Right femoral hematoma appears to be a little larger on reassessment. Nell Range PA notified and came to the bedside to assess. No new orders at this time. Will continue to monitor.

## 2019-01-25 NOTE — Evaluation (Signed)
Occupational Therapy Evaluation Patient Details Name: Rachel Santos MRN: [id number] DOB: 03/09/1941 Today's Date: 01/25/2019    History of Present Illness 78 y.o. 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 Pt underwent R groin hematoma evacuation and R common femoral artery repair 10/27. Pt also experiencing 3 episodes of sudden difficulty speaking with R facial droop, MRI demonstrating multiple small embolic strokes. Pt with PMH of a-fib, CVA, GERD, DM.   Clinical Impression   This 78 yo female admitted and underwent above presents to acute OT with decreased endurance for activity, decreased mobility, decreased balance all affecting her safety and independence with basic ADLs with patient being independent with basic ADLs and IADLs pta. She will benefit from acute OT with follow up [place].    Follow Up Recommendations  Home health OT;Supervision/Assistance - 24 hour    Equipment Recommendations  None recommended by OT       Precautions / Restrictions Precautions Precautions: Fall Precaution Comments: monitor BP (c/o dizziness but so far BP stable) Restrictions Weight Bearing Restrictions: No      Mobility Bed Mobility Overal bed mobility: Needs Assistance Bed Mobility: Supine to Sit;Sit to Supine     Supine to sit: HOB elevated;Min assist Sit to supine: Min assist      Transfers Overall transfer level: Needs assistance Equipment used: 1 person hand held assist Transfers: Sit to/from Stand Sit to Stand: Min assist              Balance Overall balance assessment: Needs assistance Sitting-balance support: No upper extremity supported;Feet supported Sitting balance-Leahy Scale: Good     Standing balance support: Single extremity supported Standing balance-Leahy Scale: Poor                             ADL either performed or assessed with clinical judgement   ADL Overall ADL's : Needs assistance/impaired Eating/Feeding:  Independent;Sitting   Grooming: Set up;Supervision/safety;Sitting   Upper Body Bathing: Supervision/ safety;Set up;Sitting   Lower Body Bathing: Moderate assistance Lower Body Bathing Details (indicate cue type and reason): min A sit<>stand Upper Body Dressing : Minimal assistance;Sitting   Lower Body Dressing: Maximal assistance Lower Body Dressing Details (indicate cue type and reason): min A sit<>stand Toilet Transfer: Minimal assistance Toilet Transfer Details (indicate cue type and reason): Unilateral HHA side step up towards [place] (took 3 times to get to [facility]) Toileting- Clothing Manipulation and Hygiene: Total assistance Toileting - Clothing Manipulation Details (indicate cue type and reason): min A sit<>stand             Vision Patient Visual Report: No change from baseline              Pertinent Vitals/Pain Pain Assessment: No/denies pain Faces Pain Scale: No hurt     Hand Dominance Right   Extremity/Trunk Assessment Upper Extremity Assessment Upper Extremity Assessment: Generalized weakness           Communication Communication Communication: HOH   Cognition Arousal/Alertness: Lethargic(woke her up from a nap) Behavior During Therapy: WFL for tasks assessed/performed Overall Cognitive Status: Impaired/Different from baseline                                                Home Living Family/patient expects to be discharged to:: Private residence Living Arrangements: Alone Available Help  at Discharge: Family;Available 24 hours/day(intially--per daughter from [place], the patient will come and stay with her 2-3 days then will be with either of the other daughters or at home with someone with her.) Type of Home: Apartment Home Access: Level entry     Home Layout: One level     Bathroom Shower/Tub: Teacher, early years/pre: Standard     Home Equipment: Environmental consultant - 2 wheels;Kasandra Knudsen - single point      Lives With:  Alone    Prior Functioning/Environment Level of Independence: Independent;Independent with assistive device(s)        Comments: pt intermittently uses cane when knee is hurting, household ambulator        OT Problem List: Decreased activity tolerance;Impaired balance (sitting and/or standing)      OT Treatment/Interventions: Self-care/ADL training;DME and/or AE instruction;Patient/family education;Balance training    OT Goals(Current goals can be found in the care plan section) Acute Rehab OT Goals Patient Stated Goal: To go home OT Goal Formulation: With patient/family Time For Goal Achievement: 02/08/19 Potential to Achieve Goals: Good  OT Frequency: Min 2X/week              AM-PAC OT "6 Clicks" Daily Activity     Outcome Measure Help from another person eating meals?: None Help from another person taking care of personal grooming?: A Little Help from another person toileting, which includes using toliet, bedpan, or urinal?: A Lot Help from another person bathing (including washing, rinsing, drying)?: A Lot Help from another person to put on and taking off regular upper body clothing?: A Little Help from another person to put on and taking off regular lower body clothing?: A Lot 6 Click Score: 16   End of Session Nurse Communication: Mobility status  Activity Tolerance: (limited by feelingdizzy) Patient left: in bed;with nursing/sitter in room;with family/visitor present  OT Visit Diagnosis: Unsteadiness on feet (R26.81);Other abnormalities of gait and mobility (R26.89);Muscle weakness (generalized) (M62.81);Dizziness and giddiness (R42)                Time: [id number] OT Time Calculation (min): 28 min Charges:  OT General Charges $OT Visit: 1 Visit OT Evaluation $OT Eval Moderate Complexity: 1 Mod OT Treatments $Self Care/Home Management : 8-22 mins  [place] [address], OTR/L Acute [employer] Pager [phone] Office [phone]     Almon Register 01/25/2019, 4:51 PM

## 2019-01-25 NOTE — Progress Notes (Signed)
Pt complaining of vision changes. Neuro exam negative. Nell Range PA notified.

## 2019-01-25 NOTE — Progress Notes (Signed)
  [facility]   MULTIDISCIPLINARY HEART VALVE TEAM  Checked on patient who is much more oriented today. Complains of seeing clear lines intermittently. Neuro exam is normal. She says that this has happened to her before this admission. Will continue to monitor. Will place transfer orders to 4E.  Angelena Form PA-C  [facility]

## 2019-01-25 NOTE — Evaluation (Signed)
Physical Therapy Evaluation Patient Details Name: Rachel Santos MRN: [id number] DOB: 02-Jan-1941 Today's Date: 01/25/2019   History of Present Illness  78 y.o. 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 Pt underwent R groin hematoma evacuation and R common femoral artery repair 10/27. Pt also experiencing 3 episodes of sudden difficulty speaking with R facial droop, MRI demonstrating multiple small embolic strokes. Pt with PMH of a-fib, CVA, GERD, DM.  Clinical Impression  Pt demonstrates deficits in functional mobility, gait, balance, endurance, power, and strength. Pt with some noted memory deficits and slowed processing. Pt requires minA-supervision for household mobility with use of RW at this time. Pt will benefit from continued acute PT services to reduce falls risk and restore independence.    Follow Up Recommendations Home health PT    Equipment Recommendations  None recommended by PT    Recommendations for Other Services       Precautions / Restrictions Precautions Precautions: Fall Restrictions Weight Bearing Restrictions: No      Mobility  Bed Mobility Overal bed mobility: Needs Assistance Bed Mobility: Supine to Sit     Supine to sit: Min assist        Transfers Overall transfer level: Needs assistance Equipment used: Rolling walker (2 wheeled) Transfers: Sit to/from Stand Sit to Stand: Min guard            Ambulation/Gait Ambulation/Gait assistance: Supervision Gait Distance (Feet): 30 Feet Assistive device: Rolling walker (2 wheeled) Gait Pattern/deviations: Step-to pattern;Trunk flexed Gait velocity: reduced Gait velocity interpretation: <1.8 ft/sec, indicate of risk for recurrent falls General Gait Details: slowed step through gait with reduced stride length  Stairs            Wheelchair Mobility    Modified Rankin (Stroke Patients Only) Modified Rankin (Stroke Patients Only) Pre-Morbid Rankin Score: No  significant disability Modified Rankin: Moderately severe disability     Balance Overall balance assessment: Needs assistance Sitting-balance support: Single extremity supported;Feet supported Sitting balance-Leahy Scale: Good Sitting balance - Comments: supervision   Standing balance support: Bilateral upper extremity supported Standing balance-Leahy Scale: Good Standing balance comment: supervision with BUE support of RW                             Pertinent Vitals/Pain Pain Assessment: No/denies pain    Home Living Family/patient expects to be discharged to:: Private residence Living Arrangements: Alone Available Help at Discharge: Family;Available PRN/intermittently Type of Home: Apartment Home Access: Level entry     Home Layout: One level Home Equipment: Walker - 2 wheels;Cane - single point      Prior Function Level of Independence: Independent;Independent with assistive device(s)         Comments: pt intermittently uses cane when knee is hurting, household ambulator     Hand Dominance        Extremity/Trunk Assessment   Upper Extremity Assessment Upper Extremity Assessment: Overall WFL for tasks assessed    Lower Extremity Assessment Lower Extremity Assessment: Generalized weakness(grossly 4/5 BLE)    Cervical / Trunk Assessment Cervical / Trunk Assessment: Normal  Communication   Communication: HOH  Cognition Arousal/Alertness: Awake/alert Behavior During Therapy: WFL for tasks assessed/performed Overall Cognitive Status: Impaired/Different from baseline Area of Impairment: Memory                     Memory: Decreased short-term memory         General Comments: pt unable  to recall phone number, also with slowed processing      General Comments General comments (skin integrity, edema, etc.): pt reporting dizziness during session although BP remains stable throughout    Exercises     Assessment/Plan    PT  Assessment Patient needs continued PT services  PT Problem List Decreased strength;Decreased activity tolerance;Decreased balance;Decreased mobility;Decreased knowledge of use of DME;Decreased safety awareness;Cardiopulmonary status limiting activity       PT Treatment Interventions DME instruction;Gait training;Functional mobility training;Therapeutic activities;Therapeutic exercise;Balance training;Neuromuscular re-education;Patient/family education    PT Goals (Current goals can be found in the Care Plan section)  Acute Rehab PT Goals Patient Stated Goal: To go home PT Goal Formulation: With patient Time For Goal Achievement: 02/08/19 Potential to Achieve Goals: Good    Frequency Min 3X/week   Barriers to discharge        Co-evaluation               AM-PAC PT "6 Clicks" Mobility  Outcome Measure Help needed turning from your back to your side while in a flat bed without using bedrails?: A Little Help needed moving from lying on your back to sitting on the side of a flat bed without using bedrails?: A Little Help needed moving to and from a bed to a chair (including a wheelchair)?: A Little Help needed standing up from a chair using your arms (e.g., wheelchair or bedside chair)?: A Little Help needed to walk in hospital room?: None Help needed climbing 3-5 steps with a railing? : A Little 6 Click Score: 19    End of Session Equipment Utilized During Treatment: Gait belt Activity Tolerance: Patient tolerated treatment well Patient left: in chair;with call bell/phone within reach;with chair alarm set Nurse Communication: Mobility status PT Visit Diagnosis: Other abnormalities of gait and mobility (R26.89)    Time: [id number] PT Time Calculation (min) (ACUTE ONLY): 30 min   Charges:   PT Evaluation $PT Eval Low Complexity: Long, PT, DPT Acute Rehabilitation Pager: [place] 01/25/2019, 10:03 AM

## 2019-01-25 NOTE — Progress Notes (Addendum)
[facility] VALVE TEAM  Patient Name: Rachel Santos Date of Encounter: 01/25/2019  Primary Cardiologist: Dr. Angelena Form / Dr. Angelena Form & Dr. Roxy Manns (TAVR)  Hospital Problem List     Principal Problem:   S/P TAVR (transcatheter aortic valve replacement) Active Problems:   PAF (paroxysmal atrial fibrillation) (HCC)   S/P mitral valve repair   S/P Maze operation for atrial fibrillation   S/P TVR (tricuspid valve repair)   HTN (hypertension)   Severe aortic stenosis   Type 2 diabetes mellitus with complication, with long-term current use of insulin ([place])   Groin hematoma   Acute CVA (cerebrovascular accident) ([place])     Subjective   She is complaining of constipation this morning. No chest pain. Groin sore.   Inpatient Medications    Scheduled Meds:  aspirin  81 mg Oral Daily   busPIRone  10 mg Oral BID   Chlorhexidine Gluconate Cloth  6 each Topical Daily   insulin aspart  0-24 Units Subcutaneous TID AC & HS   mouth rinse  15 mL Mouth Rinse BID   pantoprazole  40 mg Oral Daily   pravastatin  40 mg Oral q1800   sodium chloride flush  10-40 mL Intracatheter Q12H   sodium chloride flush  3 mL Intravenous Q12H   venlafaxine XR  150 mg Oral Q breakfast   venlafaxine XR  75 mg Oral Q breakfast   Continuous Infusions:  sodium chloride 5 mL/hr at 01/25/19 0700   heparin 800 Units/hr (01/25/19 0700)   PRN Meds: sodium chloride, acetaminophen **OR** acetaminophen, Melatonin, morphine injection, ondansetron (ZOFRAN) IV, oxyCODONE, sodium chloride flush, sodium chloride flush, traMADol   Vital Signs    Vitals:   01/25/19 0500 01/25/19 0600 01/25/19 0650 01/25/19 0700  BP: (!) 147/59 139/66  (!) 159/53  Pulse:      Resp: (!) 27 (!) 30  (!) 22  Temp:   (!) 101.7 F (38.7 C) 98.4 F (36.9 C)  TempSrc:   Oral Oral  SpO2: 96% 94%  98%  Weight:      Height:        Intake/Output Summary (Last 24 hours) at 01/25/2019  0830 Last data filed at 01/25/2019 0700 Gross per 24 hour  Intake 778.11 ml  Output 1945 ml  Net -1166.89 ml   Filed Weights   01/22/19 0721 01/23/19 0500 01/25/19 0417  Weight: 69.2 kg 70.3 kg 69 kg    Physical Exam    General: Well developed, well nourished, NAD  HEENT: OP clear, mucus membranes moist  SKIN: warm, dry. No rashes. Neuro: No focal deficits  Musculoskeletal: Muscle strength 5/5 all ext  Psychiatric: Mood and affect normal  Neck: No JVD, no carotid bruits, no thyromegaly, no lymphadenopathy.  Lungs:Clear bilaterally, no wheezes, rhonci, crackles Cardiovascular: Regular rate and rhythm. No murmurs, gallops or rubs. Abdomen:Soft. Bowel sounds present. Non-tender.  Extremities: Right groin hematoma/ecchymosis. No LE edema.     Labs    CBC Recent Labs    01/24/19 0346 01/24/19 1304 01/25/19 0413  WBC 7.6  --  8.7  HGB 6.9* 8.2* 8.1*  HCT 20.6* 25.3* 23.8*  MCV 92.4  --  90.5  PLT 97*  --  98*   Basic Metabolic Panel Recent Labs    01/23/19 0328 01/24/19 0346 01/25/19 0413  NA 134* 138 138  K 4.9 3.9 3.8  CL 105 111 108  CO2 24 20* 23  GLUCOSE 227* 154* 132*  BUN 25*  26* 13  CREATININE 0.98 0.93 0.72  CALCIUM 8.4* 8.0* 8.1*  MG 1.7  --   --    Liver Function Tests No results for input(s): AST, ALT, ALKPHOS, BILITOT, PROT, ALBUMIN in the last 72 hours. No results for input(s): LIPASE, AMYLASE in the last 72 hours. Cardiac Enzymes No results for input(s): CKTOTAL, CKMB, CKMBINDEX, TROPONINI in the last 72 hours. BNP Invalid input(s): POCBNP D-Dimer No results for input(s): DDIMER in the last 72 hours. Hemoglobin A1C No results for input(s): HGBA1C in the last 72 hours. Fasting Lipid Panel Recent Labs    01/23/19 1530  CHOL 112  HDL 38*  LDLCALC 44  TRIG 149  CHOLHDL 2.9   Thyroid Function Tests No results for input(s): TSH, T4TOTAL, T3FREE, THYROIDAB in the last 72 hours.  Invalid input(s): FREET3  Telemetry    Sinus with  PACs - Personally Reviewed  ECG    No AM EKG Personally Reviewed  Radiology    Ct Head Wo Contrast  Result Date: 01/23/2019 CLINICAL DATA:  Speech difficulty, aphasia. EXAM: CT HEAD WITHOUT CONTRAST TECHNIQUE: Contiguous axial images were obtained from the base of the skull through the vertex without intravenous contrast. COMPARISON:  Head CT performed earlier the same day 01/23/2019, brain MRI/MRA 09/27/2012 FINDINGS: Brain: No evidence of acute intracranial hemorrhage. No demarcated cortical infarction. No evidence of intracranial mass. No midline shift or extra-axial fluid collection. Mild generalized parenchymal atrophy. Vascular: No definite hyperdense vessel. Atherosclerotic calcification of the carotid artery siphons and vertebrobasilar system. Skull: Normal. Negative for fracture or focal lesion. Sinuses/Orbits: Visualized orbits demonstrate no acute abnormality. No significant paranasal sinus disease or mastoid effusion. IMPRESSION: 1. No CT evidence of acute intracranial abnormality. 2. Mild generalized parenchymal atrophy. Electronically Signed   By: Kellie Simmering DO   On: 01/23/2019 21:27   Mr Angio Head Wo Contrast  Result Date: 01/24/2019 CLINICAL DATA:  Right facial droop and sudden onset difficulty speaking. EXAM: MRI HEAD WITHOUT CONTRAST MRA HEAD WITHOUT CONTRAST TECHNIQUE: Multiplanar, multiecho pulse sequences of the brain and surrounding structures were obtained without intravenous contrast. Angiographic images of the head were obtained using MRA technique without contrast. COMPARISON:  None. FINDINGS: MRI HEAD FINDINGS BRAIN: There are scattered punctate foci of acute ischemia, predominantly within the left hemisphere, but there are at least 2 sites in the right hemisphere. The greatest concentration of lesions is along the left precentral gyrus. There also lesions in the left parietal lobe and left cerebellum. The midline structures are normal. Minimal white matter  hyperintensity, nonspecific and commonly seen in asymptomatic patients of this age. The CSF spaces are normal for age, with no hydrocephalus. Susceptibility-sensitive sequences show no chronic microhemorrhage or superficial siderosis. SKULL AND UPPER CERVICAL SPINE: The visualized skull base, calvarium, upper cervical spine and extracranial soft tissues are normal. SINUSES/ORBITS: No fluid levels or advanced mucosal thickening. No mastoid or middle ear effusion. The orbits are normal. MRA HEAD FINDINGS POSTERIOR CIRCULATION: --Basilar artery: Normal. --Posterior cerebral arteries: Normal. Both originate from the basilar artery. There is a left P-comm. Right P-comm is diminutive or absent. --Superior cerebellar arteries: Normal. --Inferior cerebellar arteries: Normal anterior and posterior inferior cerebellar arteries. ANTERIOR CIRCULATION: --Intracranial internal carotid arteries: Mild atherosclerotic narrowing. --Anterior cerebral arteries: Normal. Both A1 segments are present. Patent anterior communicating artery. --Middle cerebral arteries: Normal. IMPRESSION: 1. Multifocal acute ischemia scattered within both hemispheres, left-greater-than-right, and in the left cerebellum. The pattern is most consistent with a central embolic source, particularly in a patient with  a history of atrial fibrillation. 2. No emergent large vessel occlusion or high-grade stenosis. 3. Mild intracranial atherosclerosis. Electronically Signed   By: Ulyses Jarred M.D.   On: 01/24/2019 03:12   Mr Brain Wo Contrast  Result Date: 01/24/2019 CLINICAL DATA:  Right facial droop and sudden onset difficulty speaking. EXAM: MRI HEAD WITHOUT CONTRAST MRA HEAD WITHOUT CONTRAST TECHNIQUE: Multiplanar, multiecho pulse sequences of the brain and surrounding structures were obtained without intravenous contrast. Angiographic images of the head were obtained using MRA technique without contrast. COMPARISON:  None. FINDINGS: MRI HEAD FINDINGS BRAIN:  There are scattered punctate foci of acute ischemia, predominantly within the left hemisphere, but there are at least 2 sites in the right hemisphere. The greatest concentration of lesions is along the left precentral gyrus. There also lesions in the left parietal lobe and left cerebellum. The midline structures are normal. Minimal white matter hyperintensity, nonspecific and commonly seen in asymptomatic patients of this age. The CSF spaces are normal for age, with no hydrocephalus. Susceptibility-sensitive sequences show no chronic microhemorrhage or superficial siderosis. SKULL AND UPPER CERVICAL SPINE: The visualized skull base, calvarium, upper cervical spine and extracranial soft tissues are normal. SINUSES/ORBITS: No fluid levels or advanced mucosal thickening. No mastoid or middle ear effusion. The orbits are normal. MRA HEAD FINDINGS POSTERIOR CIRCULATION: --Basilar artery: Normal. --Posterior cerebral arteries: Normal. Both originate from the basilar artery. There is a left P-comm. Right P-comm is diminutive or absent. --Superior cerebellar arteries: Normal. --Inferior cerebellar arteries: Normal anterior and posterior inferior cerebellar arteries. ANTERIOR CIRCULATION: --Intracranial internal carotid arteries: Mild atherosclerotic narrowing. --Anterior cerebral arteries: Normal. Both A1 segments are present. Patent anterior communicating artery. --Middle cerebral arteries: Normal. IMPRESSION: 1. Multifocal acute ischemia scattered within both hemispheres, left-greater-than-right, and in the left cerebellum. The pattern is most consistent with a central embolic source, particularly in a patient with a history of atrial fibrillation. 2. No emergent large vessel occlusion or high-grade stenosis. 3. Mild intracranial atherosclerosis. Electronically Signed   By: Ulyses Jarred M.D.   On: 01/24/2019 03:12   Ct Head Code Stroke Wo Contrast  Result Date: 01/23/2019 CLINICAL DATA:  Code stroke.  Speech  disturbance. Weakness. EXAM: CT HEAD WITHOUT CONTRAST TECHNIQUE: Contiguous axial images were obtained from the base of the skull through the vertex without intravenous contrast. COMPARISON:  11/07/2018 FINDINGS: Brain: Mild age related atrophy. No evidence of old or acute focal infarction, mass lesion, hemorrhage, hydrocephalus or extra-axial collection. Vascular: There is atherosclerotic calcification of the major vessels at the base of the brain. No sign of acute hyperdense vessel. Skull: Negative Sinuses/Orbits: Clear/normal Other: None ASPECTS ([place] Stroke Program Early CT Score) - Ganglionic level infarction (caudate, lentiform nuclei, internal capsule, insula, M1-M3 cortex): 7 - Supraganglionic infarction (M4-M6 cortex): 3 Total score (0-10 with 10 being normal): 10 IMPRESSION: No acute finding by CT. Mild age related atrophy. Atherosclerotic calcification of the major vessels at the base of the brain. *ASPECTS is 10. * These results were communicated to Dr. Leonie Man at 2:26 pmon 10/28/2020by text page via the [facility] messaging system. Electronically Signed   By: Nelson Chimes M.D.   On: 01/23/2019 14:28    Cardiac Studies   TAVR OPERATIVE NOTE   Date of Procedure:                01/22/2019  Preoperative Diagnosis:      Severe Aortic Stenosis   Postoperative Diagnosis:    Same   Procedure:        Transcatheter  Aortic Valve Replacement - Percutaneous Right Transfemoral Approach             Edwards Sapien 3 Ultra THV (size 23 mm, model # 9750TFX, serial # [id number])              Co-Surgeons:                        Valentina Gu. Roxy Manns, MD and Lauree Chandler, MD  Anesthesiologist:                  Laurie Panda, MD  Echocardiographer:              Sanda Klein, MD  Pre-operative Echo Findings: ? Severe aortic stenosis ? Normal left ventricular systolic function  Post-operative Echo Findings: ? Mild paravalvular leak ? Normal left ventricular systolic  function  _________________  Echo 01/23/19: IMPRESSIONS  1. Left ventricular ejection fraction, by visual estimation, is 65 to 70%. The left ventricle has normal function. Normal left ventricular size. There is no left ventricular hypertrophy.  2. Left ventricular diastolic Doppler parameters are consistent with impaired relaxation pattern of LV diastolic filling.  3. Global right ventricle has normal systolic function.The right ventricular size is normal. No increase in right ventricular wall thickness.  4. Left atrial size was normal.  5. Right atrial size was normal.  6. Moderate mitral annular calcification.  7. The mitral valve is normal in structure. Mild mitral valve regurgitation. Mild mitral stenosis.  8. The tricuspid valve is normal in structure. Tricuspid valve regurgitation is trivial.  9. Aortic valve regurgitation is mild by color flow Doppler. Mild aortic valve stenosis. 10. Mild perivalvular regurgitation. 11. The pulmonic valve was normal in structure. Pulmonic valve regurgitation is not visualized by color flow Doppler. 12. Normal pulmonary artery systolic pressure. 13. The inferior vena cava is normal in size with greater than 50% respiratory variability, suggesting right atrial pressure of 3 mmHg.    Patient Profile     Rachel Santos is a 78 y.o. 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   Assessment & Plan    Severe AS: s/p successful TAVR with a 23 mm Edwards Sapien 3 THV via the TF approach on 01/22/19. Post operative echo showed EF 65-70%, normally functioning TAVR with mean gradient of 13 mm Hg and mild PVL. Groin sites are stable. Right groin with hematoma and ecchymosis. Will stop IV heparin this am and resume home dose of Xarelto. Will hold ASA for now as well given the  hematoma.   Right groin hematoma: unfortunately, she developed an extensive right groin hematoma after ambulation on the night of the procedure. She was taken to the OR for emergent exploration. The hematoma was evacuated. Perclose sutures were partially caught on the inguinal ligament. She underwent successful repair of the right common femoral artery. JP drain out.    Acute blood loss anemia: Hgb stable this am post transfusion.  No active bleeding   Acute CVA: patient with TIA the evening of the TAVR procedure and then then waxing and waning mental status, facial droop and aphagia the night of the TAVR procedure.  tPA not administered due to right groin hematoma s/p surgical intervention. Repeat CT head showed no new hemorrhage or large stroke.  She was started on  a heparin drip. Subsequent MRA showed multifocal acute ischemia scattered within both hemispheres, left-greater-than-right, and in the left cerebellum. The pattern is most consistent with a central embolic source, particularly in a patient with a history of atrial fibrillation. Appreciate neurology assistance.  -Pt back to baseline neuro status.  -PT/OT and SLP consults today.   HTN: BP well controlled. No changes  Hx of mitral/tricuspid valve ring w/ MAZE (2014): this has been stable by echocardiography  PAF: Runs of SVT vs atrial fibrillation overnight. Sinus this am with PACs. Home Xarelto has been on hold given the large right groin hematoma. Now on IV heparin with acute CVA. Will stop IV heparin today and start home dose of Xarelto without aspirin.   Fever: Pt febrile earlier this am. Will follow fever curve. Will send blood cultures. Check U/A  Signed, Lauree Chandler, MD  01/25/2019, 8:30 AM

## 2019-01-25 NOTE — Progress Notes (Signed)
   VASCULAR SURGERY ASSESSMENT & PLAN:   POD 3 S/PREPAIR OF RIGHT COMMON FEMORAL ARTERY:Patient is doing well status post repair of the right common femoral artery and evacuation of a large hematoma.  Her drain was removed yesterday.  She has reaccumulated some fluid in the right groin but this is soft.  I do not think there is any evidence of active bleeding.  I would expect this to take 4 to 6 weeks to resolve.  SUBJECTIVE:   No specific complaints.  PHYSICAL EXAM:   Vitals:   01/25/19 1513 01/25/19 1600 01/25/19 1700 01/25/19 1800  BP:   117/68 (!) 133/53  Pulse:    77  Resp:  18 18 (!) 25  Temp: 99.5 F (37.5 C)     TempSrc: Oral     SpO2:  100% 100% 100%  Weight:      Height:       Moderate swelling in the right groin which is soft. Her incision looks fine. Right foot warm and well-perfused.  LABS:   Lab Results  Component Value Date   WBC 8.7 01/25/2019   HGB 8.1 (L) 01/25/2019   HCT 23.8 (L) 01/25/2019   MCV 90.5 01/25/2019   PLT 98 (L) 01/25/2019   Lab Results  Component Value Date   CREATININE 0.72 01/25/2019   Lab Results  Component Value Date   INR 1.1 01/22/2019   CBG (last 3)  Recent Labs    01/25/19 0759 01/25/19 1144 01/25/19 1515  GLUCAP 198* 241* 217*    PROBLEM LIST:    Principal Problem:   S/P TAVR (transcatheter aortic valve replacement) Active Problems:   PAF (paroxysmal atrial fibrillation) (HCC)   S/P mitral valve repair   S/P Maze operation for atrial fibrillation   S/P TVR (tricuspid valve repair)   HTN (hypertension)   Severe aortic stenosis   Type 2 diabetes mellitus with complication, with long-term current use of insulin (HCC)   Groin hematoma   Acute CVA (cerebrovascular accident) ([place])   CURRENT MEDS:   . busPIRone  10 mg Oral BID  . Chlorhexidine Gluconate Cloth  6 each Topical Daily  . insulin aspart  0-24 Units Subcutaneous TID AC & HS  . mouth rinse  15 mL Mouth Rinse BID  . pantoprazole  40 mg Oral Daily   . polyethylene glycol  17 g Oral Daily  . pravastatin  40 mg Oral q1800  . rivaroxaban  15 mg Oral Daily  . sodium chloride flush  10-40 mL Intracatheter Q12H  . sodium chloride flush  3 mL Intravenous Q12H  . venlafaxine XR  150 mg Oral Q breakfast  . venlafaxine XR  75 mg Oral Q breakfast    Deitra Mayo Office: [phone] 01/25/2019

## 2019-01-25 NOTE — Progress Notes (Signed)
CARDIAC REHAB PHASE I   PRE:  Rate/Rhythm: 76 SR    BP: sitting 124/53    SaO2: 100 RA  MODE:  Ambulation: to BSC, then stood again   POST:  Rate/Rhythm: 80 SR    BP: sitting 121/50, after standing and sitting 113/55     SaO2: 100 RA  Awoke pt, willing to walk. Pt alert, just somewhat sleepy. To BSC for BM and was able to stand and pivot then stand again to be cleaned. However when we stood again to walk she suddenly started closing her eyes and crossing legs. Had her sit again on EOB and pt sts that she was about to pass out. Decided to let her rest for now and encouraged her to get to chair later. She has been quite sleepy today.  She also c/o severe pain at rectum from hard stool. She gets anxious about this. Tobias, ACSM 01/25/2019 2:38 PM

## 2019-01-26 DIAGNOSIS — S301XXA Contusion of abdominal wall, initial encounter: Secondary | ICD-10-CM

## 2019-01-26 DIAGNOSIS — Z952 Presence of prosthetic heart valve: Secondary | ICD-10-CM | POA: Diagnosis not present

## 2019-01-26 DIAGNOSIS — Z9889 Other specified postprocedural states: Secondary | ICD-10-CM

## 2019-01-26 DIAGNOSIS — I480 Paroxysmal atrial fibrillation: Secondary | ICD-10-CM | POA: Diagnosis not present

## 2019-01-26 DIAGNOSIS — Z8679 Personal history of other diseases of the circulatory system: Secondary | ICD-10-CM

## 2019-01-26 LAB — CBC
HCT: 22.4 % — ABNORMAL LOW (ref 36.0–46.0)
Hemoglobin: 7.3 g/dL — ABNORMAL LOW (ref 12.0–15.0)
MCH: 30.4 pg (ref 26.0–34.0)
MCHC: 32.6 g/dL (ref 30.0–36.0)
MCV: 93.3 fL (ref 80.0–100.0)
Platelets: 102 10*3/uL — ABNORMAL LOW (ref 150–400)
RBC: 2.4 MIL/uL — ABNORMAL LOW (ref 3.87–5.11)
RDW: 13.7 % (ref 11.5–15.5)
WBC: 7.6 10*3/uL (ref 4.0–10.5)
nRBC: 0 % (ref 0.0–0.2)

## 2019-01-26 LAB — BASIC METABOLIC PANEL
Anion gap: 7 (ref 5–15)
BUN: 13 mg/dL (ref 8–23)
CO2: 24 mmol/L (ref 22–32)
Calcium: 8 mg/dL — ABNORMAL LOW (ref 8.9–10.3)
Chloride: 106 mmol/L (ref 98–111)
Creatinine, Ser: 0.87 mg/dL (ref 0.44–1.00)
GFR calc Af Amer: >60 mL/min (ref >60)
GFR calc non Af Amer: >60 mL/min (ref >60)
Glucose, Bld: 135 mg/dL — ABNORMAL HIGH (ref 70–99)
Potassium: 3.9 mmol/L (ref 3.5–5.1)
Sodium: 137 mmol/L (ref 135–145)

## 2019-01-26 LAB — GLUCOSE, CAPILLARY
Glucose-Capillary: 140 mg/dL — ABNORMAL HIGH (ref 70–99)
Glucose-Capillary: 236 mg/dL — ABNORMAL HIGH (ref 70–99)
Glucose-Capillary: 196 mg/dL — ABNORMAL HIGH (ref 70–99)
Glucose-Capillary: 210 mg/dL — ABNORMAL HIGH (ref 70–99)

## 2019-01-26 LAB — PREPARE RBC (CROSSMATCH)

## 2019-01-26 MED ORDER — SODIUM CHLORIDE 0.9% IV SOLUTION: Freq: Once | INTRAVENOUS | Status: DC

## 2019-01-26 MED ORDER — ACETAMINOPHEN 325 MG PO TABS
650.0000 mg | ORAL_TABLET | Freq: Once | ORAL | Status: AC
  Administered 2019-01-26: 650 mg via ORAL
  Filled 2019-01-26: qty 2

## 2019-01-26 NOTE — Progress Notes (Signed)
Progress Note  Patient Name: Rachel Santos Date of Encounter: 01/26/2019  Primary Cardiologist: Lauree Chandler, MD   Subjective   Still having some discomfort in her right groin.  No chest pain no shortness of breath  Inpatient Medications    Scheduled Meds: . sodium chloride   Intravenous Once  . acetaminophen  650 mg Oral Once  . busPIRone  10 mg Oral BID  . Chlorhexidine Gluconate Cloth  6 each Topical Daily  . insulin aspart  0-24 Units Subcutaneous TID AC & HS  . mouth rinse  15 mL Mouth Rinse BID  . pantoprazole  40 mg Oral Daily  . polyethylene glycol  17 g Oral Daily  . pravastatin  40 mg Oral q1800  . rivaroxaban  15 mg Oral Daily  . sodium chloride flush  10-40 mL Intracatheter Q12H  . sodium chloride flush  3 mL Intravenous Q12H  . venlafaxine XR  150 mg Oral Q breakfast  . venlafaxine XR  75 mg Oral Q breakfast   Continuous Infusions: . sodium chloride Stopped (01/25/19 1142)   PRN Meds: sodium chloride, acetaminophen **OR** acetaminophen, Melatonin, morphine injection, ondansetron (ZOFRAN) IV, oxyCODONE, sodium chloride flush, sodium chloride flush, traMADol   Vital Signs    Vitals:   01/25/19 1833 01/25/19 2106 01/26/19 0352 01/26/19 0743  BP: (!) 133/49 (!) 131/54 (!) 116/53 (!) 135/48  Pulse:  76  77  Resp: 20  15 20   Temp: 98.8 F (37.1 C) 99.9 F (37.7 C) 99.3 F (37.4 C) 98.5 F (36.9 C)  TempSrc: Oral Oral Oral Oral  SpO2: 95% 97% 99% 97%  Weight:      Height:        Intake/Output Summary (Last 24 hours) at 01/26/2019 1357 Last data filed at 01/25/2019 1600 Gross per 24 hour  Intake -  Output 125 ml  Net -125 ml   Last 3 Weights 01/25/2019 01/23/2019 01/22/2019  Weight (lbs) 152 lb 1.9 oz 154 lb 15.7 oz 152 lb 9.6 oz  Weight (kg) 69 kg 70.3 kg 69.219 kg      Telemetry    Sinus, PACs- Personally Reviewed  ECG    No new- Personally Reviewed  Physical Exam   GEN: No acute distress.   Neck: No JVD Cardiac: RRR,  2/6 systolic murmur, no rubs, or gallops.  Respiratory: Clear to auscultation bilaterally. GI: Soft, nontender, non-distended  MS:  Right groin/lower abdominal leg region ecchymotic Neuro:  Nonfocal  Psych: Normal affect   Labs    High Sensitivity Troponin:   Recent Labs  Lab 01/23/19 0328  TROPONINIHS 93*      Chemistry Recent Labs  Lab 01/24/19 0346 01/25/19 0413 01/26/19 0402  NA 138 138 137  K 3.9 3.8 3.9  CL 111 108 106  CO2 20* 23 24  GLUCOSE 154* 132* 135*  BUN 26* 13 13  CREATININE 0.93 0.72 0.87  CALCIUM 8.0* 8.1* 8.0*  GFRNONAA 58* >60 >60  GFRAA >60 >60 >60  ANIONGAP 7 7 7      Hematology Recent Labs  Lab 01/24/19 0346 01/24/19 1304 01/25/19 0413 01/26/19 0402  WBC 7.6  --  8.7 7.6  RBC 2.23*  --  2.63* 2.40*  HGB 6.9* 8.2* 8.1* 7.3*  HCT 20.6* 25.3* 23.8* 22.4*  MCV 92.4  --  90.5 93.3  MCH 30.9  --  30.8 30.4  MCHC 33.5  --  34.0 32.6  RDW 13.2  --  13.7 13.7  PLT 97*  --  98* 102*    BNPNo results for input(s): BNP, PROBNP in the last 168 hours.   DDimer No results for input(s): DDIMER in the last 168 hours.   Radiology    No results found.  Cardiac Studies   23 mm TAVR valve Edwards  EF 65 to 70%  Patient Profile     78 y.o. female post TAVR, TIA, right groin evacuation postoperatively, anemia  Assessment & Plan    Anemia blood loss -Hemoglobin this morning 7.3.  Transfusing 1 unit.  Discussed with her and her family.  Also discussed with Dr. Angelena Form  TAVR -Structurally doing well.  Groin complication, right groin hematoma -Still having some pain in her right groin.  Felt to be seroma, Dr. Nicole Cella note reviewed.  TIA -Back to baseline neuro status.  Paroxysmal atrial fibrillation -Back on Xarelto.  No further fevers  Complex medical history, discussion with family  For questions or updates, please contact [facility] Please consult [email or web address] for contact info under        Signed, Candee Furbish, MD   01/26/2019, 1:57 PM

## 2019-01-26 NOTE — Progress Notes (Addendum)
  Progress Note    01/26/2019 7:48 AM 4 Days Post-Op  Subjective:  Denies rest pain RLE; some pain R groin   Vitals:   01/26/19 0352 01/26/19 0743  BP: (!) 116/53 (!) 135/48  Pulse:  77  Resp: 15 20  Temp: 99.3 F (37.4 C) 98.5 F (36.9 C)  SpO2: 99% 97%   Physical Exam: Lungs:  Non labored Incisions:  R groin incision c/d/i; likely seroma, old hematoma collection, no drainage Extremities:  R PTA multiphasic by doppler Neurologic: A&O  CBC    Component Value Date/Time   WBC 7.6 01/26/2019 0402   RBC 2.40 (L) 01/26/2019 0402   HGB 7.3 (L) 01/26/2019 0402   HGB 13.0 12/17/2018 1006   HCT 22.4 (L) 01/26/2019 0402   HCT 38.8 12/17/2018 1006   PLT 102 (L) 01/26/2019 0402   PLT 231 12/17/2018 1006   MCV 93.3 01/26/2019 0402   MCV 89 12/17/2018 1006   MCH 30.4 01/26/2019 0402   MCHC 32.6 01/26/2019 0402   RDW 13.7 01/26/2019 0402   RDW 12.2 12/17/2018 1006   LYMPHSABS 1.6 12/17/2018 1006   MONOABS 0.9 11/07/2018 1314   EOSABS 0.1 12/17/2018 1006   BASOSABS 0.0 12/17/2018 1006    BMET    Component Value Date/Time   NA 137 01/26/2019 0402   NA 137 12/17/2018 1006   K 3.9 01/26/2019 0402   CL 106 01/26/2019 0402   CO2 24 01/26/2019 0402   GLUCOSE 135 (H) 01/26/2019 0402   BUN 13 01/26/2019 0402   BUN 23 12/17/2018 1006   CREATININE 0.87 01/26/2019 0402   CALCIUM 8.0 (L) 01/26/2019 0402   GFRNONAA >60 01/26/2019 0402   GFRAA >60 01/26/2019 0402    INR    Component Value Date/Time   INR 1.1 01/22/2019 0728     Intake/Output Summary (Last 24 hours) at 01/26/2019 0748 Last data filed at 01/25/2019 1600 Gross per 24 hour  Intake 23.62 ml  Output 310 ml  Net -286.38 ml     Assessment/Plan:  78 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 I agree with the findings by the PA.  The swelling in the right groin is stable.  I think this is simply a reaccumulation of some serous fluid.  I can follow this as an outpatient.  She can be discharged from my standpoint.  Gae Gallop, MD [phone]

## 2019-01-26 NOTE — Plan of Care (Signed)
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

## 2019-01-27 ENCOUNTER — Encounter (HOSPITAL_COMMUNITY): Payer: Self-pay | Admitting: Cardiology

## 2019-01-27 DIAGNOSIS — I729 Aneurysm of unspecified site: Secondary | ICD-10-CM | POA: Diagnosis not present

## 2019-01-27 DIAGNOSIS — T81718A Complication of other artery following a procedure, not elsewhere classified, initial encounter: Secondary | ICD-10-CM | POA: Diagnosis not present

## 2019-01-27 DIAGNOSIS — D62 Acute posthemorrhagic anemia: Secondary | ICD-10-CM | POA: Diagnosis not present

## 2019-01-27 DIAGNOSIS — G459 Transient cerebral ischemic attack, unspecified: Secondary | ICD-10-CM

## 2019-01-27 HISTORY — DX: Acute posthemorrhagic anemia: D62

## 2019-01-27 LAB — GLUCOSE, CAPILLARY
Glucose-Capillary: 166 mg/dL — ABNORMAL HIGH (ref 70–99)
Glucose-Capillary: 258 mg/dL — ABNORMAL HIGH (ref 70–99)
Glucose-Capillary: 107 mg/dL — ABNORMAL HIGH (ref 70–99)
Glucose-Capillary: 157 mg/dL — ABNORMAL HIGH (ref 70–99)

## 2019-01-27 LAB — TYPE AND SCREEN
ABO/RH(D): A NEG
Antibody Screen: NEGATIVE
Unit division: 0
Unit division: 0
Weak D: POSITIVE

## 2019-01-27 LAB — BPAM RBC
Blood Product Expiration Date: 202011232359
Blood Product Expiration Date: 202011252359
ISSUE DATE / TIME: 202010290906
ISSUE DATE / TIME: 202010311716
Unit Type and Rh: 600
Unit Type and Rh: 600

## 2019-01-27 LAB — HEMOGLOBIN AND HEMATOCRIT, BLOOD
HCT: 24.6 % — ABNORMAL LOW (ref 36.0–46.0)
Hemoglobin: 8.2 g/dL — ABNORMAL LOW (ref 12.0–15.0)

## 2019-01-27 MED ORDER — LOVASTATIN 40 MG PO TABS: 80.0000 mg | ORAL_TABLET | Freq: Every day | ORAL | Status: DC

## 2019-01-27 MED ORDER — POLYETHYLENE GLYCOL 3350 17 G PO PACK: 17.0000 g | PACK | Freq: Every day | ORAL | 0 refills | Status: DC

## 2019-01-27 NOTE — Progress Notes (Signed)
   PT/OT recommended home health PT/OT following discharge. Dr. Marlou Porch saw patient earlier today which meets face to face criteria. Will place order for face to face home health so patient can be discharged.  Darreld Mclean, PA-C 01/27/2019 4:32 PM

## 2019-01-27 NOTE — Progress Notes (Signed)
    Patient was to be discharged today.  She apparently is having a low-grade fever and has been having headaches since last Thursday.  The family was uneasy taking her home.  The patient does not feel well and does not think that she is ready to go home.  I had a long discussion with the patient, and several family members 1 in the hospital room and the other by cell phone.  I think the best plan is to cancel the discharge and to keep her here overnight.  The TAVR team can sort out all of their issues and discharge her tomorrow if appropriate.    Mertie Moores, MD  01/27/2019 5:35 PM    [facility] [place],  [address], [place]  [address] Phone: [phone]; Fax: [phone]

## 2019-01-27 NOTE — Discharge Summary (Addendum)
Discharge Summary    Patient ID: Rachel Santos MRN: [id number]; DOB: 1940-12-28  Admit date: 01/22/2019 Discharge date: 01/27/2019  Primary Care Provider: Merrilee Seashore, MD  Primary Cardiologist: Lauree Chandler, MD  Primary Electrophysiologist:  None   Discharge Diagnoses    Principal Problem:   S/P TAVR (transcatheter aortic valve replacement) Active Problems:   PAF (paroxysmal atrial fibrillation) (HCC)   S/P mitral valve repair   S/P Maze operation for atrial fibrillation   S/P TVR (tricuspid valve repair)   HTN (hypertension)   Severe aortic stenosis   Type 2 diabetes mellitus with complication, with long-term current use of insulin (HCC)   Groin hematoma with urgent evacuation 01/23/19   Acute CVA (cerebrovascular accident) ([place])   Acute blood loss anemia, transfused 2 units PRBC total this admit    Diagnostic Studies/Procedures    TAVR OPERATIVE NOTE   Date of Procedure:01/22/2019  Preoperative Diagnosis:Severe Aortic Stenosis   Postoperative Diagnosis:Same   Procedure:   Transcatheter Aortic Valve Replacement - PercutaneousRightTransfemoral Approach Edwards Sapien 3 Ultra THV (size 3mm, model # 9750TFX, serial # [id number])  Co-Surgeons:Clarence H. Roxy Manns, MD and Lauree Chandler, MD  Anesthesiologist:Chris Ermalene Postin, MD  Echocardiographer:Mihai Croitoru, MD  Pre-operative Echo Findings: ? Severe aortic stenosis ? Normalleft ventricular systolic function  Post-operative Echo Findings: ? Mildparavalvular leak ? Normalleft ventricular systolic function  _________________ Echo 01/22/19  PRE-PROCEDURE Findings                 Normal left ventricular size and systolic function and regional                 wall motion.                 Severe calcific aortic stenosis.                 Peak gradient 58 mm Hg,  mean gradient 33 mm Hg, dimensionless                 obstructive index 0.19, calculated aortic valve area 0.6 cm sq.                 No aortic insufficiency.                 Mild-moderate mitral stenosis (s/p annuloplasty). Mean mitral                 gradient 7 mm Hg at 60 bpm, PHT 137 ms (valve area 1.6 cm sq).                 Mild-moderate central mitral insufficiency.                 No pericardial effusion.                   POST-PROCEDURE Findings                 Normal left ventricular size and systolic function and regional                 wall motion.                 Well deployed [employer], 23 mm stent-valve.                 Peak gradient 9 mm Hg, mean gradient 5 mm Hg, dimensionless                 obstructive index 0.66, calculated aortic  valve area 2.07 cm sq.                 There is a trivial aortic perivalvular leak, at the level of the                 aorto-mitral continuity.                 Unchanged mild central mitral insufficiency and mild moderate                 mitral stenosis (mean gradient 7 mm Hg, PHT 122 ms).                 No pericardial effusion.  IMPRESSIONS    1. Left ventricular ejection fraction, by visual estimation, is 60 to 65%. The left ventricle has normal function. There is mildly increased left ventricular hypertrophy.  2. Elevated mean left atrial pressure.  3. Indeterminate findings due to mitral stenosis. pattern of LV diastolic filling.  4. Global right ventricle has normal systolic function.The right ventricular size is normal. No increase in right ventricular wall thickness.  5. Right atrial size was normal.  6. Moderate thickening of the mitral valve leaflet(s).  7. Severe mitral annular calcification.  8. The mitral valve has been repaired with an annuloplasty ring. Mild mitral valve regurgitation. Mild-moderate mitral stenosis.  9. Gradients across the mitral valve are higher compared to the August 2020 study, despite a  slower heart rate. However, today's study shows mitral valve gradients similar to the 2018 and 2019 echo studies. 10. The aortic valve is tricuspid Aortic valve regurgitation was not visualized by color flow Doppler. Severe aortic valve stenosis. 11. The pulmonic valve was grossly normal. Pulmonic valve regurgitation is not visualized by color flow Doppler. 12. TR signal is inadequate for assessing pulmonary artery systolic pressure. 13. The interatrial septum was not assessed. 14. Left atrial size was moderately dilated. 15. The tricuspid valve is normal in structure. Tricuspid valve regurgitation was not visualized by color flow Doppler. 16. S/P surgical tricuspid valve repair.    FINDINGS  Left Ventricle: Left ventricular ejection fraction, by visual estimation, is 60 to 65%. The left ventricle has normal function. No evidence of left ventricular regional wall motion abnormalities. There is mildly increased left ventricular hypertrophy.  Concentric left ventricular hypertrophy. Spectral Doppler shows indeterminate findings due to mitral stenosis. pattern of LV diastolic filling. Elevated mean left atrial pressure.  Right Ventricle: The right ventricular size is normal. No increase in right ventricular wall thickness. Global RV systolic function is has normal systolic function.  Left Atrium: Left atrial size was moderately dilated.  Right Atrium: Right atrial size was normal in size  Pericardium: There is no evidence of pericardial effusion.  Mitral Valve: The mitral valve has been repaired/replaced. There is moderate thickening of the mitral valve leaflet(s). Severe mitral annular calcification. Mild-moderate mitral valve stenosis by observation. MV Area by PHT, 1.61 cm. MV PHT, 136.83  msec. MV peak gradient, 24.6 mmHg. Mild mitral valve regurgitation, with centrally-directed jet. Gradients across the mitral valve are higher compared to the August 2020 study, despite a slower heart  rate. However, today's study shows mitral valve  gradients similar to the 2018 and 2019 echo studies.  Tricuspid Valve: The tricuspid valve is normal in structure. Tricuspid valve regurgitation was not visualized by color flow Doppler. S/P surgical tricuspid valve repair.  Aortic Valve: The aortic valve is tricuspid. Aortic valve regurgitation was not visualized by color flow Doppler.  Severe aortic stenosis is present. Aortic valve mean gradient measures 36.0 mmHg. Aortic valve peak gradient measures 63.0 mmHg. Aortic  valve area, by VTI measures 0.60 cm.  Pulmonic Valve: The pulmonic valve was grossly normal. Pulmonic valve regurgitation is not visualized by color flow Doppler.  Aorta: The aortic root is normal in size and structure.  Shunts: The interatrial septum was not assessed.    LEFT VENTRICLE PLAX 2D LVOT diam:     2.00 cm LVOT Area:     3.14 cm    AORTIC VALVE AV Area (Vmax):    0.53 cm AV Area (Vmean):   0.83 cm AV Area (VTI):     0.60 cm AV Vmax:           397.00 cm/s AV Vmean:          195.500 cm/s AV VTI:            1.080 m AV Peak Grad:      63.0 mmHg AV Mean Grad:      36.0 mmHg LVOT Vmax:         66.84 cm/s LVOT Vmean:        51.690 cm/s LVOT VTI:          0.207 m LVOT/AV VTI ratio: 0.19  MITRAL VALVE MV Area (PHT): 1.61 cm     SHUNTS MV Peak grad:  24.6 mmHg    Systemic VTI:  0.21 m MV Mean grad:  7.0 mmHg     Systemic Diam: 2.00 cm MV Vmax:       2.48 m/s MV Vmean:      111.0 cm/s MV VTI:        0.68 m MV PHT:        136.83 msec MV Decel Time: 472 msec   01/22/19 Expanding hematoma right groin status post TAVR  _SURGEON: Judeth Cornfield. Scot Dock, MD  ASSIST: Laurence Slate, PA  ANESTHESIA: General  EBL: Per anesthesia record  INDICATIONS:    Rachel Santos is a 78 y.o. female who underwent a TAVR earlier today and after ambulating developed a large hematoma in the right groin.  Despite pressure this appears to be  expanding I recommended urgent exploration in the operating room  FINDINGS:   The Perclose closure was right at the level of the inguinal ligament and was partially caught in the inguinal ligament.  This was dissected free and the common femoral artery was repaired transversely with a running 5-0 Prolene.  TECHNIQUE:   The patient was taken to the operating room and received a general anesthetic.  The abdomen and right groin and thigh were prepped and draped in usual sterile fashion.  An oblique incision was made in the right groin and the dissection carried down to the hematoma which was evacuated.  This is largely dissected laterally.  The dissection was carried down to the common femoral artery and upon dissection further proximally towards the inguinal ligament the Perclose sutures were identified and were partially caught on the inguinal ligament.  I dissected the inguinal ligament away from the artery and was able to ultimately get proximal control of the artery as the artery was bleeding and this was controlled with digital pressure.  I got distal control and then after the patient was heparinized the artery was clamped proximally and distally.  The Perclose sutures were removed.  The arteriotomy in the common femoral artery was closed with a running 5-0 Prolene suture.  There was good hemostasis.  There was  good Doppler signals distal to the repair.  The wound was irrigated.  A 19 Blake drain was placed and tunneled laterally.  The wound was closed with a deep layer of 2-0 Vicryl, the subcutaneous layer was closed with 3-0 Vicryl.  The skin was closed with 4-0 Vicryl.  A sterile dressing was applied.  The patient tolerated the procedure well was transferred to the recovery room in stable condition.  All needle and sponge counts were correct.  Deitra Mayo, MD, FACS [facility]   Echo 01/23/19  IMPRESSIONS    1. Left ventricular ejection  fraction, by visual estimation, is 65 to 70%. The left ventricle has normal function. Normal left ventricular size. There is no left ventricular hypertrophy.  2. Left ventricular diastolic Doppler parameters are consistent with impaired relaxation pattern of LV diastolic filling.  3. Global right ventricle has normal systolic function.The right ventricular size is normal. No increase in right ventricular wall thickness.  4. Left atrial size was normal.  5. Right atrial size was normal.  6. Moderate mitral annular calcification.  7. The mitral valve is normal in structure. Mild mitral valve regurgitation. Mild mitral stenosis.  8. The tricuspid valve is normal in structure. Tricuspid valve regurgitation is trivial.  9. Aortic valve regurgitation is mild by color flow Doppler. Mild aortic valve stenosis. 10. Mild perivalvular regurgitation. 11. The pulmonic valve was normal in structure. Pulmonic valve regurgitation is not visualized by color flow Doppler. 12. Normal pulmonary artery systolic pressure. 13. The inferior vena cava is normal in size with greater than 50% respiratory variability, suggesting right atrial pressure of 3 mmHg.  FINDINGS  Left Ventricle: Left ventricular ejection fraction, by visual estimation, is 65 to 70%. The left ventricle has normal function. There is no left ventricular hypertrophy. Normal left ventricular size. Spectral Doppler shows Left ventricular diastolic  Doppler parameters are consistent with impaired relaxation pattern of LV diastolic filling.  Right Ventricle: The right ventricular size is normal. No increase in right ventricular wall thickness. Global RV systolic function is has normal systolic function. The tricuspid regurgitant velocity is 1.53 m/s, and with an assumed right atrial pressure  of 10 mmHg, the estimated right ventricular systolic pressure is normal at 19.4 mmHg.  Left Atrium: Left atrial size was normal in size.  Right Atrium: Right  atrial size was normal in size  Pericardium: There is no evidence of pericardial effusion.  Mitral Valve: The mitral valve is normal in structure. Moderate mitral annular calcification. Mild mitral valve stenosis by observation. MV Area by PHT, 1.49 cm. MV PHT, 148.00 msec. MV peak gradient, 13.7 mmHg. Mild mitral valve regurgitation.  Tricuspid Valve: The tricuspid valve is normal in structure. Tricuspid valve regurgitation is trivial by color flow Doppler.  Aortic Valve: The aortic valve has been repaired/replaced. Aortic valve regurgitation is mild by color flow Doppler. Mild aortic stenosis is present. Aortic valve mean gradient measures 13.0 mmHg. Aortic valve peak gradient measures 22.7 mmHg. Aortic  valve area, by VTI measures 2.92 cm. 31mm Edwards Sapien bioprosthetic, stented aortic valve (TAVR) valve is present in the aortic position. Procedure Date: 01/22/2019. Mild perivalvular regurgitation.  Pulmonic Valve: The pulmonic valve was normal in structure. Pulmonic valve regurgitation is not visualized by color flow Doppler.  Aorta: The aortic root, ascending aorta and aortic arch are all structurally normal, with no evidence of dilitation or obstruction.  Venous: The inferior vena cava is normal in size with greater than 50% respiratory variability, suggesting  right atrial pressure of 3 mmHg.  Shunts: No ventricular septal defect is seen or detected. There is no evidence of an atrial septal defect. No atrial level shunt detected by color flow Doppler.    LEFT VENTRICLE          Normals PLAX 2D LVOT diam:     2.30 cm  2.0 cm LVOT Area:     4.15 cm 3.14 cm2    AORTIC VALVE                    Normals AV Area (Vmax):    3.07 cm AV Area (Vmean):   3.18 cm     3.06 cm2 AV Area (VTI):     2.92 cm AV Vmax:           238.33 cm/s AV Vmean:          167.000 cm/s 77 cm/s AV VTI:            0.530 m      3.15 cm2 AV Peak Grad:      22.7 mmHg AV Mean Grad:      13.0 mmHg     3 mmHg LVOT Vmax:         176.00 cm/s LVOT Vmean:        128.000 cm/s 75 cm/s LVOT VTI:          0.373 m      25.3 cm LVOT/AV VTI ratio: 0.70         1  MITRAL VALVE               Normals   TRICUSPID VALVE             Normals MV Area (PHT): 1.49 cm              TV Peak grad:   3.9 mmHg MV Peak grad:  13.7 mmHg   4 mmHg    TV Mean grad:   2.0 mmHg MV Mean grad:  5.0 mmHg              TV Vmax:        0.99 m/s MV Vmax:       1.85 m/s              TV Vmean:       62.1 cm/s MV Vmean:      104.0 cm/s            TV VTI:         0.43 msec MV VTI:        0.72 m                TR Peak grad:   9.4 mmHg MV PHT:        148.00 msec 55 ms     TR Vmax:        153.00 cm/s 288 cm/s MV E velocity: 169.00 cm/s 103 cm/s MV A velocity: 92.20 cm/s  70.3 cm/s SHUNTS MV E/A ratio:  1.83        1.5       Systemic VTI:  0.37 m                                      Systemic Diam: 2.30 cm      ____________   History of Present Illness     Rachel Santos  is a 78 y.o. female with MR, TR and PAF on long term anticoagulation on xarelto.  She has had hx of MV repair and tricuspid valve repair, and maze procedure in 2014, previous TIA, HTN, DM-2 on insulin, non obstructive CAD, CKD stage 2-3 and now with severe symptomatic aortic stenosis and syncope.     Patient's cardiac history dates back to 2014 when she presented with TIA in the setting of newly diagnosed paroxysmal atrial fibrillation.  She was found to have severe mitral regurgitation and moderate tricuspid regurgitation and underwent mitral valve repair, tricuspid valve repair, and Maze procedure on October 11, 2012.  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  She also underwent tricuspid ring annuloplasty and maze procedure at that time.  Intraoperative findings were also notable for the presence of aortic valve sclerosis without aortic  stenosis on transesophageal echocardiogram.  Her postoperative convalescence was notable only for some transient bradycardia and she was evaluated by Dr. Caryl Comes who did not feel that permanent pacemaker implantation was necessary.  She otherwise did well and was last seen here in our office on December 09, 2013 at which time she was doing well and maintaining sinus rhythm.  She has been followed intermittently ever since by Dr. Angelena Form and clinically has done well until recently.  11/07/18 pt had syncopal event.  Follow up TTE with normal left ventricular systolic function with intact mitral valve repair and tricuspid valve repair but at least moderate if not severe aortic stenosis, although transvalvular gradients were felt to be somewhat difficult to assess.  Followed up with Dr. Angelena Form and plan for TEE, though cancelled due to severity of AS.  She underwent cardiac cath 12/24/18 Attempts across aortic valve by catheterization were unsuccessful.  The patient was noted to have mild nonobstructive coronary disease in the left anterior descending and left circumflex territories with moderate nonobstructive disease in the right coronary artery territory.  Right heart pressures were mild to moderately elevated.  CT angiography was performed and the patient evaluated by Dr. Roxy Manns   With stage D severe symptomatic aortic stenosis and progression of symptoms of exertional shortness of breath and fatigue consistent with chronic diastolic congestive heart failureto NYHA functional class IIb bordering on class III, and prior MV repair and TV repair that are intact and  no significant residual mitral regurgitation or tricuspid regurgitation Windows are not optimal.  1 of the leaflets is very thickened and calcified and essentially immobile.  The other 2 leaflets are more mobile but remain significantly diseased.  Peak velocity across aortic valve was measured as high as 4.8 m/s corresponding to mean transvalvular gradient  estimated 51 mmHg.  The DVI was notably 0.32 but aortic valve area calculated 0.92 cm.  Diagnostic cardiac catheterization was notable for moderate nonobstructive coronary artery disease and moderate pulmonary hypertension.    With review Dr. Roxy Manns recommended TAVR after discussion with pt and her daughter on risks and benefits plans were made to proceed.    Pt presented 01/22/19 for procedure TAVR, a 23 mm Edwards Sapien 3 THV via the TF approach on 01/22/19. Post operative echo showed EF 65-70%, normally functioning TAVR with mean gradient of 13 mm Hg and mild PVL  Hospital Course     Consultants: Vascular  Dr. Scot Dock,  Neuro Dr. Leonie Man     Pt tolerated the procedure well and admitted to surgical ICU. She continued to  do well and later in day was able to ambulate in hall.  With ambulation she developed a large Rt groin hematoma.  Pressure held and BP was stable.  Despite pressure hematoma increased in size and Vascular surgeon Dr. Scot Dock was consulted and due to increased size pt taken to OR for urgent exploration.  Pt and family agreeable. Pts BP was stable and remained in SR.    She did well post op and was stable the next AM, then 01/23/19 around 1:50 pm pt while walking with cardiac rehab had difficulty talking and Rt sided facial weakness.  Code stroke was called.  Emergency CT of head was neg for acute bleed.  Concern for TIA and was seen by Neurology.  Later 1940 pt had difficulty with language again, and had some confusion with neuro eval.  + acute ischemic stroke, no tPA due to Rt groin hematoma and surgical intervention.   IV heparin was started. Subsequent MRA showed multifocal acute ischemia scattered within both hemispheres, left-greater-than-right, and in the left cerebellum. The pattern is most consistent with a central embolic source, particularly in a patient with a history of atrial fibrillation.  01/24/19 Hgb 6.9 transfused 1 unit PRBCs.  Pt continued to improve and evaluated by  speech therapy and PT. Pt did have minor vision changes but this is ongoing issue had prior to admit.    01/26/19 Hgb down to 7.3 and transfused 1 unit PRBCs.  Her Rt groin with pain and felt to be seroma..  No residual from CVA. Pt is back on xarelto and no complications.    01/27/19 pt has been seen by Dr. Marlou Porch and Dr. Scot Dock and she is stable for discharge.  She has close follow up in valvular clinic.  Hgb today 8.2, Hct 24.6 improved after transfusion.  She is able to ambulate.  No chest pain and no SOB.  seh ws found stable for discharge by Dr. Marlou Porch. Dr. Marlou Porch discussed with Dr. Angelena Form as well.    Discharge instructions include ABX prophylaxis for dental procedures.  Will ask office to provide on follow up.  No pain meds given at discharge, has not used is last several days.   Did the patient have an acute coronary syndrome (MI, NSTEMI, STEMI, etc) this admission?:  No                               Did the patient have a percutaneous coronary intervention (stent / angioplasty)?:  No.   _____________  Discharge Vitals Blood pressure (!) 111/56, pulse 63, temperature 99 F (37.2 C), temperature source Oral, resp. rate (!) 21, height 5\' 6"  (1.676 m), weight 67.2 kg, SpO2 95 %.  Filed Weights   01/23/19 0500 01/25/19 0417 01/27/19 0434  Weight: 70.3 kg 69 kg 67.2 kg    Labs & Radiologic Studies    CBC Recent Labs    01/25/19 0413 01/26/19 0402 01/27/19 0100  WBC 8.7 7.6  --   HGB 8.1* 7.3* 8.2*  HCT 23.8* 22.4* 24.6*  MCV 90.5 93.3  --   PLT 98* 102*  --    Basic Metabolic Panel Recent Labs    01/25/19 0413 01/26/19 0402  NA 138 137  K 3.8 3.9  CL 108 106  CO2 23 24  GLUCOSE 132* 135*  BUN 13 13  CREATININE 0.72 0.87  CALCIUM 8.1* 8.0*   Liver Function Tests No results for input(s): AST, ALT, ALKPHOS,  BILITOT, PROT, ALBUMIN in the last 72 hours. No results for input(s): LIPASE, AMYLASE in the last 72 hours. High Sensitivity Troponin:   Recent Labs  Lab  01/23/19 0328  TROPONINIHS 93*    BNP Invalid input(s): POCBNP D-Dimer No results for input(s): DDIMER in the last 72 hours. Hemoglobin A1C No results for input(s): HGBA1C in the last 72 hours. Fasting Lipid Panel No results for input(s): CHOL, HDL, LDLCALC, TRIG, CHOLHDL, LDLDIRECT in the last 72 hours. Thyroid Function Tests No results for input(s): TSH, T4TOTAL, T3FREE, THYROIDAB in the last 72 hours.  Invalid input(s): FREET3 _____________  Dg Chest 2 View  Result Date: 01/18/2019 CLINICAL DATA:  Aortic valve replacement. EXAM: CHEST - 2 VIEW COMPARISON:  01/02/2019. FINDINGS: Prior cardiac valve replacement. Left atrial appendage clip noted in stable position. Heart size normal. No focal infiltrate. No pleural effusion or pneumothorax. Degenerative changes scoliosis thoracic spine. Old right rib fractures noted. IMPRESSION: 1. Prior cardiac valve replacement. Left atrial appendage clip noted stable position. Heart size stable. 2.  No acute pulmonary disease. Electronically Signed   By: Marcello Moores  Register   On: 01/18/2019 10:22   Ct Head Wo Contrast  Result Date: 01/23/2019 CLINICAL DATA:  Speech difficulty, aphasia. EXAM: CT HEAD WITHOUT CONTRAST TECHNIQUE: Contiguous axial images were obtained from the base of the skull through the vertex without intravenous contrast. COMPARISON:  Head CT performed earlier the same day 01/23/2019, brain MRI/MRA 09/27/2012 FINDINGS: Brain: No evidence of acute intracranial hemorrhage. No demarcated cortical infarction. No evidence of intracranial mass. No midline shift or extra-axial fluid collection. Mild generalized parenchymal atrophy. Vascular: No definite hyperdense vessel. Atherosclerotic calcification of the carotid artery siphons and vertebrobasilar system. Skull: Normal. Negative for fracture or focal lesion. Sinuses/Orbits: Visualized orbits demonstrate no acute abnormality. No significant paranasal sinus disease or mastoid effusion. IMPRESSION:  1. No CT evidence of acute intracranial abnormality. 2. Mild generalized parenchymal atrophy. Electronically Signed   By: Kellie Simmering DO   On: 01/23/2019 21:27   Mr Angio Head Wo Contrast  Result Date: 01/24/2019 CLINICAL DATA:  Right facial droop and sudden onset difficulty speaking. EXAM: MRI HEAD WITHOUT CONTRAST MRA HEAD WITHOUT CONTRAST TECHNIQUE: Multiplanar, multiecho pulse sequences of the brain and surrounding structures were obtained without intravenous contrast. Angiographic images of the head were obtained using MRA technique without contrast. COMPARISON:  None. FINDINGS: MRI HEAD FINDINGS BRAIN: There are scattered punctate foci of acute ischemia, predominantly within the left hemisphere, but there are at least 2 sites in the right hemisphere. The greatest concentration of lesions is along the left precentral gyrus. There also lesions in the left parietal lobe and left cerebellum. The midline structures are normal. Minimal white matter hyperintensity, nonspecific and commonly seen in asymptomatic patients of this age. The CSF spaces are normal for age, with no hydrocephalus. Susceptibility-sensitive sequences show no chronic microhemorrhage or superficial siderosis. SKULL AND UPPER CERVICAL SPINE: The visualized skull base, calvarium, upper cervical spine and extracranial soft tissues are normal. SINUSES/ORBITS: No fluid levels or advanced mucosal thickening. No mastoid or middle ear effusion. The orbits are normal. MRA HEAD FINDINGS POSTERIOR CIRCULATION: --Basilar artery: Normal. --Posterior cerebral arteries: Normal. Both originate from the basilar artery. There is a left P-comm. Right P-comm is diminutive or absent. --Superior cerebellar arteries: Normal. --Inferior cerebellar arteries: Normal anterior and posterior inferior cerebellar arteries. ANTERIOR CIRCULATION: --Intracranial internal carotid arteries: Mild atherosclerotic narrowing. --Anterior cerebral arteries: Normal. Both A1 segments  are present. Patent anterior communicating artery. --Middle cerebral arteries: Normal.  IMPRESSION: 1. Multifocal acute ischemia scattered within both hemispheres, left-greater-than-right, and in the left cerebellum. The pattern is most consistent with a central embolic source, particularly in a patient with a history of atrial fibrillation. 2. No emergent large vessel occlusion or high-grade stenosis. 3. Mild intracranial atherosclerosis. Electronically Signed   By: Ulyses Jarred M.D.   On: 01/24/2019 03:12   Mr Brain Wo Contrast  Result Date: 01/24/2019 CLINICAL DATA:  Right facial droop and sudden onset difficulty speaking. EXAM: MRI HEAD WITHOUT CONTRAST MRA HEAD WITHOUT CONTRAST TECHNIQUE: Multiplanar, multiecho pulse sequences of the brain and surrounding structures were obtained without intravenous contrast. Angiographic images of the head were obtained using MRA technique without contrast. COMPARISON:  None. FINDINGS: MRI HEAD FINDINGS BRAIN: There are scattered punctate foci of acute ischemia, predominantly within the left hemisphere, but there are at least 2 sites in the right hemisphere. The greatest concentration of lesions is along the left precentral gyrus. There also lesions in the left parietal lobe and left cerebellum. The midline structures are normal. Minimal white matter hyperintensity, nonspecific and commonly seen in asymptomatic patients of this age. The CSF spaces are normal for age, with no hydrocephalus. Susceptibility-sensitive sequences show no chronic microhemorrhage or superficial siderosis. SKULL AND UPPER CERVICAL SPINE: The visualized skull base, calvarium, upper cervical spine and extracranial soft tissues are normal. SINUSES/ORBITS: No fluid levels or advanced mucosal thickening. No mastoid or middle ear effusion. The orbits are normal. MRA HEAD FINDINGS POSTERIOR CIRCULATION: --Basilar artery: Normal. --Posterior cerebral arteries: Normal. Both originate from the basilar  artery. There is a left P-comm. Right P-comm is diminutive or absent. --Superior cerebellar arteries: Normal. --Inferior cerebellar arteries: Normal anterior and posterior inferior cerebellar arteries. ANTERIOR CIRCULATION: --Intracranial internal carotid arteries: Mild atherosclerotic narrowing. --Anterior cerebral arteries: Normal. Both A1 segments are present. Patent anterior communicating artery. --Middle cerebral arteries: Normal. IMPRESSION: 1. Multifocal acute ischemia scattered within both hemispheres, left-greater-than-right, and in the left cerebellum. The pattern is most consistent with a central embolic source, particularly in a patient with a history of atrial fibrillation. 2. No emergent large vessel occlusion or high-grade stenosis. 3. Mild intracranial atherosclerosis. Electronically Signed   By: Ulyses Jarred M.D.   On: 01/24/2019 03:12   Ct Coronary Morph W/cta Cor W/score W/ca W/cm &/or Wo/cm  Addendum Date: 01/02/2019   ADDENDUM REPORT: 01/02/2019 12:19 CLINICAL DATA:  Aortic stenosis EXAM: Cardiac TAVR CT TECHNIQUE: The patient was scanned on a Siemens Force [id number] slice scanner. A 120 kV retrospective scan was triggered in the descending thoracic aorta at 111 HU's. Gantry rotation speed was 270 msecs and collimation was .9 mm. No beta blockade or nitro were given. The 3D data set was reconstructed in 5% intervals of the R-R cycle. Systolic and diastolic phases were analyzed on a dedicated work station using MPR, MIP and VRT modes. The patient received 80 cc of contrast. FINDINGS: Aortic Valve: Tri leaflet calcified with restricted leaflet motion Aorta: Bovine arch no aneurysm moderate calcific atherosclerosis Sinotubular Junction: 24 mm Ascending Thoracic Aorta: 32 mm Aortic Arch: 25 mm Descending Thoracic Aorta: 23 mm Sinus of Valsalva Measurements: Non-coronary: 27 mm Right - coronary: 26.4 mm Left - coronary: 28.6 mm Coronary Artery Height above Annulus: Left Main: 12.9 mm above annulus  Right Coronary: 12.4 mm above annulus Virtual Basal Annulus Measurements: Maximum/Minimum Diameter: 21.4 mm x 26.9 mm Perimeter: 76.4 mm Area: 417 mm2 Coronary Arteries: Sufficient height above annulus for deployment Optimum Fluoroscopic Angle for Delivery: LAO 19 Cranial 2 IMPRESSION:  1. Calcified tri leaflet AV with annular area of 417 mm2 suitable for a 23 mm Sapien 3 valve 2.  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  Coronary arteries sufficient height above annulus for deployment 4. Optimum angiographic angle for deployment LAO 19 Cranial 2 degrees 5. Patient is post MV annuloplasty and TV annuloplasty repair LAA has been clipped closed with no residual communication with LA [place] Electronically Signed   By: Jenkins Rouge M.D.   On: 01/02/2019 12:19   Result Date: 01/02/2019 EXAM: OVER-READ INTERPRETATION  CT CHEST The following report is an over-read performed by radiologist Dr. Vinnie Langton of [facility] on 01/02/2019. This over-read does not include interpretation of cardiac or coronary anatomy or pathology. The coronary calcium score/coronary CTA interpretation by the cardiologist is attached. COMPARISON:  Chest CTA 09/27/2012. FINDINGS: Extracardiac findings will be described separately under dictation for contemporaneously obtained CTA chest, abdomen and pelvis. IMPRESSION: Please see separate dictation for contemporaneously obtained CTA chest, abdomen and pelvis dated 01/02/2019 for full description of relevant extracardiac findings. Electronically Signed: By: Vinnie Langton M.D. On: 01/02/2019 11:48   Dg Chest Port 1 View  Result Date: 01/22/2019 CLINICAL DATA:  Status post TAVR EXAM: PORTABLE CHEST 1 VIEW COMPARISON:  January 18, 2019 FINDINGS: The heart size and mediastinal contours are unchanged. Prior aortic valve replacement is seen. Left atrial appendage clip is noted. Aortic knob calcifications. Overlying median sternotomy wires. A right-sided central venous  catheter seen with the tip at the superior cavoatrial junction. No pneumothorax. The lungs are clear. No pleural effusion. IMPRESSION: No acute cardiopulmonary process. Electronically Signed   By: Prudencio Pair M.D.   On: 01/22/2019 15:30   Ct Angio Chest Aorta W &/or Wo Contrast  Result Date: 01/02/2019 CLINICAL DATA:  78 year old female with history of severe aortic stenosis. Preprocedural study prior to potential transcatheter aortic valve replacement (TAVR) procedure. EXAM: CT ANGIOGRAPHY CHEST, ABDOMEN AND PELVIS TECHNIQUE: Multidetector CT imaging through the chest, abdomen and pelvis was performed using the standard protocol during bolus administration of intravenous contrast. Multiplanar reconstructed images and MIPs were obtained and reviewed to evaluate the vascular anatomy. CONTRAST:  173mL OMNIPAQUE IOHEXOL 350 MG/ML SOLN COMPARISON:  CTA chest 09/27/2012. FINDINGS: CTA CHEST FINDINGS Cardiovascular: Heart size is enlarged. There is no significant pericardial fluid, thickening or pericardial calcification. There is aortic atherosclerosis, as well as atherosclerosis of the great vessels of the mediastinum and the coronary arteries, including calcified atherosclerotic plaque in the left main, left anterior descending, left circumflex and right coronary arteries. Thickening calcification of the aortic valve. Status post mitral and tricuspid annuloplasty. Mediastinum/Lymph Nodes: No pathologically enlarged mediastinal or hilar lymph nodes. Esophagus is unremarkable in appearance. No axillary lymphadenopathy. Lungs/Pleura: No acute consolidative airspace disease. No pleural effusions. No suspicious appearing pulmonary nodules or masses are noted. Musculoskeletal/Soft Tissues: Status post left modified radical mastectomy. Median sternotomy wires. There are no aggressive appearing lytic or blastic lesions noted in the visualized portions of the skeleton. CTA ABDOMEN AND PELVIS FINDINGS Hepatobiliary: No  suspicious cystic or solid hepatic lesions. No intra or extrahepatic biliary ductal dilatation. 11 mm calcified gallstone in the neck of the gallbladder. No findings to suggest an acute cholecystitis at this time. Pancreas: Pancreatic atrophy. No pancreatic mass. No pancreatic ductal dilatation. No pancreatic or peripancreatic fluid collections or inflammatory changes. Spleen: Unremarkable. Adrenals/Urinary Tract: Subcentimeter low-attenuation lesion in the lower pole of the right kidney, too small to characterize, but statistically likely to represent a tiny  cyst. Left kidney and bilateral adrenal glands are normal in appearance. No hydroureteronephrosis. Urinary bladder is normal in appearance. Stomach/Bowel: Normal appearance of the stomach. No pathologic dilatation of small bowel or colon. Numerous colonic diverticulae are noted, without surrounding inflammatory changes to suggest an acute diverticulitis at this time. The appendix is not confidently identified and may be surgically absent. Regardless, there are no inflammatory changes noted adjacent to the cecum to suggest the presence of an acute appendicitis at this time. Vascular/Lymphatic: Aortic atherosclerosis, without evidence of aneurysm or dissection in the abdominal or pelvic vasculature. Vascular findings and measurements pertinent to potential T AVR procedure, as detailed below. No lymphadenopathy noted in the abdomen or pelvis. Reproductive: Uterus and left ovary are atrophic. In the right adnexal region there is a 4.9 x 3.6 x 4.7 cm low-attenuation lesion, likely to represent an ovarian cyst. Other: No significant volume of ascites.  No pneumoperitoneum. Musculoskeletal: There are no aggressive appearing lytic or blastic lesions noted in the visualized portions of the skeleton. VASCULAR MEASUREMENTS PERTINENT TO TAVR: AORTA: Minimal Aortic Diameter-12 x 10 mm Severity of Aortic Calcification-moderate to severe RIGHT PELVIS: Right Common Iliac  Artery - Minimal Diameter-7.4 x 7.1 mm Tortuosity - mild Calcification-moderate Right External Iliac Artery - Minimal Diameter-6.9 x 6.8 mm Tortuosity - mild Calcification-none Right Common Femoral Artery - Minimal Diameter-7.7 x 7.1 mm Tortuosity - mild Calcification-mild LEFT PELVIS: Left Common Iliac Artery - Minimal Diameter-8.5 x 7.8 mm Tortuosity - mild Calcification-moderate Left External Iliac Artery - Minimal Diameter-7.1 x 7.3 mm Tortuosity - mild Calcification-none Left Common Femoral Artery - Minimal Diameter-7.3 x 6.4 mm Tortuosity - mild Calcification-mild Review of the MIP images confirms the above findings. IMPRESSION: 1. Vascular findings and measurements pertinent to potential TAVR procedure, as detailed below. 2. Thickening 2 calcification of the aortic valve, compatible with the reported clinical history of severe aortic stenosis. 3. Aortic atherosclerosis, in addition to left main and 3 vessel coronary artery disease. 4. Mild cardiomegaly. 5. Cholelithiasis without evidence of acute cholecystitis at this time. 6. Colonic diverticulosis without evidence of acute diverticulitis. 7. Cystic lesion in the right adnexal region measuring 4.9 x 3.6 x 4.7 cm. Statistically, this is likely to represent a cyst, however, further evaluation with nonemergent pelvic ultrasound is recommended in the near future to better evaluate this lesion. This recommendation follows ACR consensus guidelines: Management of Incidental Pancreatic Cysts: A White Paper of the ACR Incidental Findings Committee. [place] [id number]. 8. Additional incidental findings, as above. Electronically Signed   By: Vinnie Langton M.D.   On: 01/02/2019 13:42   Ct Head Code Stroke Wo Contrast  Result Date: 01/23/2019 CLINICAL DATA:  Code stroke.  Speech disturbance. Weakness. EXAM: CT HEAD WITHOUT CONTRAST TECHNIQUE: Contiguous axial images were obtained from the base of the skull through the vertex without intravenous  contrast. COMPARISON:  11/07/2018 FINDINGS: Brain: Mild age related atrophy. No evidence of old or acute focal infarction, mass lesion, hemorrhage, hydrocephalus or extra-axial collection. Vascular: There is atherosclerotic calcification of the major vessels at the base of the brain. No sign of acute hyperdense vessel. Skull: Negative Sinuses/Orbits: Clear/normal Other: None ASPECTS ([place] Stroke Program Early CT Score) - Ganglionic level infarction (caudate, lentiform nuclei, internal capsule, insula, M1-M3 cortex): 7 - Supraganglionic infarction (M4-M6 cortex): 3 Total score (0-10 with 10 being normal): 10 IMPRESSION: No acute finding by CT. Mild age related atrophy. Atherosclerotic calcification of the major vessels at the base of the brain. *ASPECTS is 10. *  These results were communicated to Dr. Leonie Man at 2:26 pmon 10/28/2020by text page via the [facility] messaging system. Electronically Signed   By: Nelson Chimes M.D.   On: 01/23/2019 14:28   Vas US Carotid  Result Date: 01/02/2019 Carotid Arterial Duplex Study Indications:       Pre TAVR. Risk Factors:      Hypertension, Diabetes. Other Factors:     Hx TIA. Comparison Study:  no prior Performing Technologist: June Leap RDMS, RVT  Examination Guidelines: A complete evaluation includes B-mode imaging, spectral Doppler, color Doppler, and power Doppler as needed of all accessible portions of each vessel. Bilateral testing is considered an integral part of a complete examination. Limited examinations for reoccurring indications may be performed as noted.  Right Carotid Findings: +----------+--------+--------+--------+------------------+--------+             PSV cm/s EDV cm/s Stenosis Plaque Description Comments  +----------+--------+--------+--------+------------------+--------+  CCA Prox   62       16                heterogenous                 +----------+--------+--------+--------+------------------+--------+  CCA Distal 73       14                                              +----------+--------+--------+--------+------------------+--------+  ICA Prox   78       23       1-39%    heterogenous                 +----------+--------+--------+--------+------------------+--------+  ICA Distal 90       17                                             +----------+--------+--------+--------+------------------+--------+  ECA        69       5                                              +----------+--------+--------+--------+------------------+--------+ +----------+--------+-------+----------------+-------------------+             PSV cm/s EDV cms Describe         Arm Pressure (mmHG)  +----------+--------+-------+----------------+-------------------+  Subclavian 77               Multiphasic, WNL                      +----------+--------+-------+----------------+-------------------+ +---------+--------+--+--------+--+---------+  Vertebral PSV cm/s 55 EDV cm/s 12 Antegrade  +---------+--------+--+--------+--+---------+  Left Carotid Findings: +----------+--------+--------+--------+------------------+--------+             PSV cm/s EDV cm/s Stenosis Plaque Description Comments  +----------+--------+--------+--------+------------------+--------+  CCA Prox   76       16                                             +----------+--------+--------+--------+------------------+--------+  CCA Distal 52       16                                             +----------+--------+--------+--------+------------------+--------+  ICA Prox   54       16       1-39%    heterogenous                 +----------+--------+--------+--------+------------------+--------+  ICA Distal 92       25                                             +----------+--------+--------+--------+------------------+--------+  ECA        84       9                                              +----------+--------+--------+--------+------------------+--------+ +----------+--------+--------+----------------+-------------------+              PSV cm/s EDV cm/s Describe         Arm Pressure (mmHG)  +----------+--------+--------+----------------+-------------------+  Subclavian 190               Multiphasic, WNL                      +----------+--------+--------+----------------+-------------------+ +---------+--------+--+--------+--+---------+  Vertebral PSV cm/s 71 EDV cm/s 14 Antegrade  +---------+--------+--+--------+--+---------+  Summary: Right Carotid: Velocities in the right ICA are consistent with a 1-39% stenosis. Left Carotid: Velocities in the left ICA are consistent with a 1-39% stenosis.  *See table(s) above for measurements and observations.  Electronically signed by Curt Jews MD on 01/02/2019 at 4:38:44 PM.    Final    Ct Angio Abdomen Pelvis  W &/or Wo Contrast  Result Date: 01/02/2019 CLINICAL DATA:  78 year old female with history of severe aortic stenosis. Preprocedural study prior to potential transcatheter aortic valve replacement (TAVR) procedure. EXAM: CT ANGIOGRAPHY CHEST, ABDOMEN AND PELVIS TECHNIQUE: Multidetector CT imaging through the chest, abdomen and pelvis was performed using the standard protocol during bolus administration of intravenous contrast. Multiplanar reconstructed images and MIPs were obtained and reviewed to evaluate the vascular anatomy. CONTRAST:  174mL OMNIPAQUE IOHEXOL 350 MG/ML SOLN COMPARISON:  CTA chest 09/27/2012. FINDINGS: CTA CHEST FINDINGS Cardiovascular: Heart size is enlarged. There is no significant pericardial fluid, thickening or pericardial calcification. There is aortic atherosclerosis, as well as atherosclerosis of the great vessels of the mediastinum and the coronary arteries, including calcified atherosclerotic plaque in the left main, left anterior descending, left circumflex and right coronary arteries. Thickening calcification of the aortic valve. Status post mitral and tricuspid annuloplasty. Mediastinum/Lymph Nodes: No pathologically enlarged mediastinal or hilar lymph nodes.  Esophagus is unremarkable in appearance. No axillary lymphadenopathy. Lungs/Pleura: No acute consolidative airspace disease. No pleural effusions. No suspicious appearing pulmonary nodules or masses are noted. Musculoskeletal/Soft Tissues: Status post left modified radical mastectomy. Median sternotomy wires. There are no aggressive appearing lytic or blastic lesions noted in the visualized portions of the skeleton. CTA ABDOMEN AND PELVIS FINDINGS Hepatobiliary: No suspicious cystic or solid hepatic lesions. No intra or extrahepatic biliary ductal dilatation. 11 mm calcified gallstone in the neck of the gallbladder. No findings to suggest an acute cholecystitis at this time. Pancreas: Pancreatic atrophy. No pancreatic mass. No pancreatic ductal dilatation. No pancreatic or peripancreatic fluid collections or inflammatory changes. Spleen: Unremarkable. Adrenals/Urinary Tract: Subcentimeter low-attenuation lesion in the lower pole of the right kidney, too small to characterize,  but statistically likely to represent a tiny cyst. Left kidney and bilateral adrenal glands are normal in appearance. No hydroureteronephrosis. Urinary bladder is normal in appearance. Stomach/Bowel: Normal appearance of the stomach. No pathologic dilatation of small bowel or colon. Numerous colonic diverticulae are noted, without surrounding inflammatory changes to suggest an acute diverticulitis at this time. The appendix is not confidently identified and may be surgically absent. Regardless, there are no inflammatory changes noted adjacent to the cecum to suggest the presence of an acute appendicitis at this time. Vascular/Lymphatic: Aortic atherosclerosis, without evidence of aneurysm or dissection in the abdominal or pelvic vasculature. Vascular findings and measurements pertinent to potential T AVR procedure, as detailed below. No lymphadenopathy noted in the abdomen or pelvis. Reproductive: Uterus and left ovary are atrophic. In the  right adnexal region there is a 4.9 x 3.6 x 4.7 cm low-attenuation lesion, likely to represent an ovarian cyst. Other: No significant volume of ascites.  No pneumoperitoneum. Musculoskeletal: There are no aggressive appearing lytic or blastic lesions noted in the visualized portions of the skeleton. VASCULAR MEASUREMENTS PERTINENT TO TAVR: AORTA: Minimal Aortic Diameter-12 x 10 mm Severity of Aortic Calcification-moderate to severe RIGHT PELVIS: Right Common Iliac Artery - Minimal Diameter-7.4 x 7.1 mm Tortuosity - mild Calcification-moderate Right External Iliac Artery - Minimal Diameter-6.9 x 6.8 mm Tortuosity - mild Calcification-none Right Common Femoral Artery - Minimal Diameter-7.7 x 7.1 mm Tortuosity - mild Calcification-mild LEFT PELVIS: Left Common Iliac Artery - Minimal Diameter-8.5 x 7.8 mm Tortuosity - mild Calcification-moderate Left External Iliac Artery - Minimal Diameter-7.1 x 7.3 mm Tortuosity - mild Calcification-none Left Common Femoral Artery - Minimal Diameter-7.3 x 6.4 mm Tortuosity - mild Calcification-mild Review of the MIP images confirms the above findings. IMPRESSION: 1. Vascular findings and measurements pertinent to potential TAVR procedure, as detailed below. 2. Thickening 2 calcification of the aortic valve, compatible with the reported clinical history of severe aortic stenosis. 3. Aortic atherosclerosis, in addition to left main and 3 vessel coronary artery disease. 4. Mild cardiomegaly. 5. Cholelithiasis without evidence of acute cholecystitis at this time. 6. Colonic diverticulosis without evidence of acute diverticulitis. 7. Cystic lesion in the right adnexal region measuring 4.9 x 3.6 x 4.7 cm. Statistically, this is likely to represent a cyst, however, further evaluation with nonemergent pelvic ultrasound is recommended in the near future to better evaluate this lesion. This recommendation follows ACR consensus guidelines: Management of Incidental Pancreatic Cysts: A White Paper  of the ACR Incidental Findings Committee. [place] [id number]. 8. Additional incidental findings, as above. Electronically Signed   By: Vinnie Langton M.D.   On: 01/02/2019 13:42   Disposition   Pt is being discharged home today in good condition.  Follow-up Plans & Appointments    Follow-up Information    Eileen Stanford, PA-C. Go on 01/30/2019.   Specialties: Cardiology, Radiology Why: @ 2pm, please arrive at least 10 minutes early Contact information: [place] [address] [phone]        [place] [facility] Follow up on 02/08/2019.   Specialty: Cardiology Why: at 1245 for echo then appt with Dr. Mariella Saa information: [address] [place]       [name], MD Follow up on 02/08/2019.   Specialty: Cardiology Why: at 1:40 Pm Contact information: [place]. [address] [place] [address] [phone]        Angelia Mould,  MD Follow up.   Specialties: Vascular Surgery, Cardiology Why: his office will call you to arrange appt aoubt your Rt groin if you have not heard by Thursday call their office.  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   Pneumococcal Vaccines Swelling, Rash   At injection set      Medication List    STOP taking these medications   diclofenac sodium 1 % Gel Commonly known as: VOLTAREN   hydrocortisone 25 MG suppository Commonly known as: ANUSOL-HC     TAKE these medications   acetaminophen 500 MG tablet Commonly known as: TYLENOL Take 1,000 mg by mouth every 6 (six) hours as needed for moderate pain or headache. Notes to patient: Take  as needed   busPIRone 10 MG tablet Commonly known as: BUSPAR Take 10 mg by mouth 2 (two) times daily.   Calcium Plus Vitamin D3 600-500 MG-UNIT Caps Generic drug: Calcium Carb-Cholecalciferol Take 1 tablet by mouth 2 (two) times daily.   DRY EYE RELIEF DROPS OP Place 1 drop into both eyes daily as needed (dry eyes). Notes to patient: Take as needed   Levemir FlexPen 100 UNIT/ML Pen Generic drug: Insulin Detemir Inject 32 Units into the skin daily. Notes to patient: Take as you do at home    lovastatin 40 MG tablet Commonly known as: MEVACOR Take 2 tablets (80 mg total) by mouth at bedtime.   Melatonin 5 MG Caps Take 5 mg by mouth at bedtime as needed (sleep). Notes to patient: Take as needed for sleep   multivitamin with minerals Tabs tablet Take 1 tablet by mouth daily.   omeprazole 40 MG capsule Commonly known as: PRILOSEC Take 40 mg by mouth daily before breakfast.   Ozempic (0.25 or 0.5 MG/DOSE) 2 MG/1.5ML Sopn Generic drug: Semaglutide(0.25 or 0.5MG /DOS) Inject 0.5 mg into the skin every Sunday.   polyethylene glycol 17 g packet Commonly known as: MIRALAX / GLYCOLAX Take 17 g by mouth daily. Start taking on: January 28, 2019   Rivaroxaban 15 MG Tabs tablet Commonly known as: XARELTO Take 15 mg by mouth daily with supper.   venlafaxine XR 75 MG 24 hr capsule Commonly known as: EFFEXOR-XR Take 75 mg by mouth daily with breakfast. Take with 150 mg capsule to equal 225 mg daily   venlafaxine XR 150 MG 24 hr capsule Commonly known as: EFFEXOR-XR Take 150 mg by mouth daily with breakfast. Take with 75 mg capsule to equal 225 mg daily          Outstanding Labs/Studies   CBC, BMP  Script for dental Prophylaxis abx.   Duration of Discharge Encounter   Greater than 30 minutes including physician time.  Signed, Cecilie Kicks, NP 01/27/2019, 3:35 PM   Personally seen and examined. Agree with above. Earlier today was appropriate for DC. See my progress  note from today. Hg, stable post transfusion.   Update from Dr. Cathie Olden below however.   "Patient was to be discharged today.  She apparently is having a low-grade fever and has been having headaches since last Thursday.  The family was uneasy taking her home.  The patient does not feel well and does not think that she  is ready to go home.  I had a long discussion with the patient, and several family members 1 in the hospital room and the other by cell phone.  I think the best plan is to cancel the discharge and to keep her here overnight.  The TAVR team can sort out all of their issues and discharge her tomorrow if appropriate.    Mertie Moores, MD  01/27/2019 5:35 PM  "  DC has been cancelled.   Candee Furbish, MD

## 2019-01-27 NOTE — Progress Notes (Addendum)
  Progress Note    01/27/2019 8:02 AM 5 Days Post-Op  Subjective:  No complaints this morning.  Some discomfort R groin but tolerable.  Patient wants to go home.   Vitals:   01/27/19 0434 01/27/19 0435  BP: (!) 151/45 (!) 137/50  Pulse:  63  Resp: 18 19  Temp:  98.2 F (36.8 C)  SpO2: 96% 96%   Physical Exam Lungs:  Non labored Incisions:  R groin incision with fluid collection, no drainage, still somewhat soft Extremities:  R PTA signal brisk by doppler Neurologic: A&O  CBC    Component Value Date/Time   WBC 7.6 01/26/2019 0402   RBC 2.40 (L) 01/26/2019 0402   HGB 8.2 (L) 01/27/2019 0100   HGB 13.0 12/17/2018 1006   HCT 24.6 (L) 01/27/2019 0100   HCT 38.8 12/17/2018 1006   PLT 102 (L) 01/26/2019 0402   PLT 231 12/17/2018 1006   MCV 93.3 01/26/2019 0402   MCV 89 12/17/2018 1006   MCH 30.4 01/26/2019 0402   MCHC 32.6 01/26/2019 0402   RDW 13.7 01/26/2019 0402   RDW 12.2 12/17/2018 1006   LYMPHSABS 1.6 12/17/2018 1006   MONOABS 0.9 11/07/2018 1314   EOSABS 0.1 12/17/2018 1006   BASOSABS 0.0 12/17/2018 1006    BMET    Component Value Date/Time   NA 137 01/26/2019 0402   NA 137 12/17/2018 1006   K 3.9 01/26/2019 0402   CL 106 01/26/2019 0402   CO2 24 01/26/2019 0402   GLUCOSE 135 (H) 01/26/2019 0402   BUN 13 01/26/2019 0402   BUN 23 12/17/2018 1006   CREATININE 0.87 01/26/2019 0402   CALCIUM 8.0 (L) 01/26/2019 0402   GFRNONAA >60 01/26/2019 0402   GFRAA >60 01/26/2019 0402    INR    Component Value Date/Time   INR 1.1 01/22/2019 0728     Intake/Output Summary (Last 24 hours) at 01/27/2019 0802 Last data filed at 01/27/2019 0600 Gross per 24 hour  Intake 1155 ml  Output -  Net 1155 ml     Assessment/Plan:  78 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 I agree with the findings by the PA.  Okay for discharge from our standpoint.  I will follow her wound as an outpatient.  I have arranged follow-up.  Gae Gallop, MD [phone]

## 2019-01-27 NOTE — TOC Transition Note (Signed)
Transition of Care [facility]) - CM/SW Discharge Note   Patient Details  Name: Rachel Santos MRN: [id number] Date of Birth: 1940/10/01  Transition of Care [facility]) CM/SW Contact:  Claudie Leach, RN Phone Number: [phone] 01/27/2019, 4:54 PM   Clinical Narrative:    Patient to d/c home with daughter, [name].  Debbie's home is at [address] [place], [place].  Patient has used [facility] in the past and would like to use again for PT, OT, SLP.  No DME needed.    Referral called to [facility] with [facility].  Information placed on AVS.     Final next level of care: [place] Barriers to Discharge: No Barriers Identified   Patient Goals and CMS Choice Patient states their goals for this hospitalization and ongoing recovery are:: to be independent CMS Medicare.gov Compare Post Acute Care list provided to:: Patient Represenative (must comment)(daughter) Choice offered to / list presented to : Adult Children  Discharge Plan and Services      HH Arranged: PT, OT, Speech Therapy [place] Agency: [place] ([facility]) Date [place]: 01/27/19 Time [place]: [place] Representative spoke with at [place]: Caryl Pina

## 2019-01-27 NOTE — Progress Notes (Signed)
Progress Note  Patient Name: Rachel Santos Date of Encounter: 01/27/2019  Primary Cardiologist: Lauree Chandler, MD   Subjective   Decreased discomfort in right groin.  Able to ambulate.  No chest pain no shortness of breath.  Inpatient Medications    Scheduled Meds: . sodium chloride   Intravenous Once  . busPIRone  10 mg Oral BID  . Chlorhexidine Gluconate Cloth  6 each Topical Daily  . insulin aspart  0-24 Units Subcutaneous TID AC & HS  . mouth rinse  15 mL Mouth Rinse BID  . pantoprazole  40 mg Oral Daily  . polyethylene glycol  17 g Oral Daily  . pravastatin  40 mg Oral q1800  . rivaroxaban  15 mg Oral Daily  . sodium chloride flush  10-40 mL Intracatheter Q12H  . sodium chloride flush  3 mL Intravenous Q12H  . venlafaxine XR  150 mg Oral Q breakfast  . venlafaxine XR  75 mg Oral Q breakfast   Continuous Infusions: . sodium chloride Stopped (01/25/19 1142)   PRN Meds: sodium chloride, acetaminophen **OR** acetaminophen, Melatonin, morphine injection, ondansetron (ZOFRAN) IV, oxyCODONE, sodium chloride flush, sodium chloride flush, traMADol   Vital Signs    Vitals:   01/26/19 2033 01/27/19 0040 01/27/19 0434 01/27/19 0435  BP:   (!) 151/45 (!) 137/50  Pulse: 63   63  Resp:  18 18 19   Temp: 98.6 F (37 C)   98.2 F (36.8 C)  TempSrc: Oral   Oral  SpO2:   96% 96%  Weight:   67.2 kg   Height:        Intake/Output Summary (Last 24 hours) at 01/27/2019 1117 Last data filed at 01/27/2019 0931 Gross per 24 hour  Intake 1278 ml  Output -  Net 1278 ml   Last 3 Weights 01/27/2019 01/25/2019 01/23/2019  Weight (lbs) 148 lb 2.4 oz 152 lb 1.9 oz 154 lb 15.7 oz  Weight (kg) 67.2 kg 69 kg 70.3 kg      Telemetry    Sinus, PACs, no changes- Personally Reviewed  ECG    No new- Personally Reviewed  Physical Exam   GEN: Well nourished, well developed, in no acute distress HEENT: normal Neck: no JVD, carotid bruits, or masses Cardiac: RRR; 2/6  systolic murmur, no rubs, or gallops,no edema  Respiratory:  clear to auscultation bilaterally, normal work of breathing GI: soft, nontender, nondistended, + BS MS: no deformity or atrophy Skin: warm and dry, no rash, right groin ecchymosis noted.  Nontender, hematoma evacuation previously. Neuro:  Alert and Oriented x 3, Strength and sensation are intact Psych: euthymic mood, full affect   Labs    High Sensitivity Troponin:   Recent Labs  Lab 01/23/19 0328  TROPONINIHS 93*      Chemistry Recent Labs  Lab 01/24/19 0346 01/25/19 0413 01/26/19 0402  NA 138 138 137  K 3.9 3.8 3.9  CL 111 108 106  CO2 20* 23 24  GLUCOSE 154* 132* 135*  BUN 26* 13 13  CREATININE 0.93 0.72 0.87  CALCIUM 8.0* 8.1* 8.0*  GFRNONAA 58* >60 >60  GFRAA >60 >60 >60  ANIONGAP 7 7 7      Hematology Recent Labs  Lab 01/24/19 0346  01/25/19 0413 01/26/19 0402 01/27/19 0100  WBC 7.6  --  8.7 7.6  --   RBC 2.23*  --  2.63* 2.40*  --   HGB 6.9*   < > 8.1* 7.3* 8.2*  HCT 20.6*   < >  23.8* 22.4* 24.6*  MCV 92.4  --  90.5 93.3  --   MCH 30.9  --  30.8 30.4  --   MCHC 33.5  --  34.0 32.6  --   RDW 13.2  --  13.7 13.7  --   PLT 97*  --  98* 102*  --    < > = values in this interval not displayed.    BNPNo results for input(s): BNP, PROBNP in the last 168 hours.   DDimer No results for input(s): DDIMER in the last 168 hours.   Radiology    No results found.  Cardiac Studies   23 mm TAVR valve Edwards  EF 65 to 70%  Patient Profile     78 y.o. female post TAVR, TIA, right groin evacuation postoperatively, anemia, PAF  Assessment & Plan    Anemia blood loss -Hemoglobin yesterday 7.3.  Received 1 more unit of packed red blood cells on 01/26/2019 responded appropriately 8.2.  Feeling better.  Discussed with Dr. Angelena Form.  TAVR -Structurally doing well.  Murmur heard on exam.  On Xarelto.  Groin complication, right groin hematoma -Still having some pain in her right groin.  Reviewed  by vascular surgery, comfortable with her discharge.  TIA -Back to baseline neuro status.  Seems to be doing well.  Clear.  Walking.  Paroxysmal atrial fibrillation -Back on Xarelto. -Currently in sinus rhythm on telemetry.  No further fevers  Vascular surgery okay with discharge.  From my standpoint okay for discharge to, stable hemoglobin.  We will have TOC visit within 7 days, check CBC at that time.  She will be staying with her daughter.  For questions or updates, please contact [place] Please consult [email or web address] for contact info under        Signed, Candee Furbish, MD  01/27/2019, 11:17 AM

## 2019-01-27 NOTE — Plan of Care (Signed)
Poc progressing.  

## 2019-01-28 LAB — CBC
HCT: 26.7 % — ABNORMAL LOW (ref 36.0–46.0)
Hemoglobin: 9 g/dL — ABNORMAL LOW (ref 12.0–15.0)
MCH: 30.6 pg (ref 26.0–34.0)
MCHC: 33.7 g/dL (ref 30.0–36.0)
MCV: 90.8 fL (ref 80.0–100.0)
Platelets: 143 10*3/uL — ABNORMAL LOW (ref 150–400)
RBC: 2.94 MIL/uL — ABNORMAL LOW (ref 3.87–5.11)
RDW: 13.8 % (ref 11.5–15.5)
WBC: 7.9 10*3/uL (ref 4.0–10.5)
nRBC: 0.3 % — ABNORMAL HIGH (ref 0.0–0.2)

## 2019-01-28 LAB — BASIC METABOLIC PANEL
Anion gap: 10 (ref 5–15)
BUN: 16 mg/dL (ref 8–23)
CO2: 23 mmol/L (ref 22–32)
Calcium: 8.3 mg/dL — ABNORMAL LOW (ref 8.9–10.3)
Chloride: 99 mmol/L (ref 98–111)
Creatinine, Ser: 0.75 mg/dL (ref 0.44–1.00)
GFR calc Af Amer: >60 mL/min (ref >60)
GFR calc non Af Amer: >60 mL/min (ref >60)
Glucose, Bld: 252 mg/dL — ABNORMAL HIGH (ref 70–99)
Potassium: 4.1 mmol/L (ref 3.5–5.1)
Sodium: 132 mmol/L — ABNORMAL LOW (ref 135–145)

## 2019-01-28 LAB — GLUCOSE, CAPILLARY: Glucose-Capillary: 158 mg/dL — ABNORMAL HIGH (ref 70–99)

## 2019-01-28 NOTE — Progress Notes (Signed)
Pt appeared alert and oriented x 4, walked to bathroom independently. No acute distress noted, denied pain. Right groin with bruising from old hematoma level 2 is stable.  Continue to monitor.  01/27/19 1938  Vitals  Temp 98.8 F (37.1 C)  Temp Source Oral  BP (!) 140/54  MAP (mmHg) 79  BP Location Left Arm  BP Method Manual  Patient Position (if appropriate) Lying  Pulse Rate 78  Pulse Rate Source Monitor  ECG Heart Rate 78  Cardiac Rhythm NSR  Resp (!) 21  Oxygen Therapy  SpO2 100 %  O2 Device Room Air  Pain Assessment  Pain Scale 0-10  Pain Score 0  Glasgow Coma Scale  Eye Opening 4  Best Verbal Response (NON-intubated) 5  Best Motor Response 6  Glasgow Coma Scale Score 15  MEWS Score  MEWS RR 1  MEWS Pulse 0  MEWS Systolic 0  MEWS LOC 0  MEWS Temp 0  MEWS Score 1  MEWS Score Color Green  MEWS Assessment  Is this an acute change? No     Kennyth Lose, RN

## 2019-01-28 NOTE — Progress Notes (Signed)
Occupational Therapy Treatment Patient Details Name: LIBBY CANDANOZA MRN: [id number] DOB: [birth year] Today's Date: 01/28/2019    History of present illness 78 y.o. 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 Pt underwent R groin hematoma evacuation and R common femoral artery repair 10/27. Pt also experiencing 3 episodes of sudden difficulty speaking with R facial droop, MRI demonstrating multiple small embolic strokes. Pt with PMH of a-fib, CVA, GERD, DM.   OT comments  Pt returning from bathroom upon OT's entry, reports she has been taking herself to the bathroom all night with RW. Pt demonstrated ability to groom at sink with set up to min assist (to put hair in pony tail) and to don clothing without physical assistance. Pt continues to demonstrate memory deficits. Pt denies dizziness with activity. Eager to go home today.  Follow Up Recommendations  Home health OT;Supervision/Assistance - 24 hour    Equipment Recommendations  None recommended by OT    Recommendations for Other Services      Precautions / Restrictions Precautions Precautions: Fall Precaution Comments: no c/o dizziness Restrictions Weight Bearing Restrictions: No       Mobility Bed Mobility Overal bed mobility: Modified Independent                Transfers Overall transfer level: Modified independent Equipment used: Rolling walker (2 wheeled)             General transfer comment: no c/o dizziness    Balance     Sitting balance-Leahy Scale: Good       Standing balance-Leahy Scale: Fair Standing balance comment: can release walker in static standing at sink without LOB                           ADL either performed or assessed with clinical judgement   ADL Overall ADL's : Needs assistance/impaired     Grooming: Modified independent;Standing;Wash/dry hands;Brushing hair Grooming Details (indicate cue type and reason): assisted to put hair in pony  tail         Upper Body Dressing : Set up;Sitting Upper Body Dressing Details (indicate cue type and reason): front opening gown Lower Body Dressing: Independent;Sitting/lateral leans Lower Body Dressing Details (indicate cue type and reason): donned socks without assist Toilet Transfer: Modified Independent;Ambulation;RW   Toileting- Clothing Manipulation and Hygiene: Modified independent;Sit to/from stand Toileting - Clothing Manipulation Details (indicate cue type and reason): pt returning from bathroom with RW upon OTs arrival     Functional mobility during ADLs: Modified independent;Rolling walker General ADL Comments: educated in energy conservation     Vision       Perception     Praxis      Cognition Arousal/Alertness: Awake/alert Behavior During Therapy: WFL for tasks assessed/performed Overall Cognitive Status: Impaired/Different from baseline Area of Impairment: Memory                     Memory: Decreased short-term memory         General Comments: unclear pt's baseline        Exercises     Shoulder Instructions       General Comments      Pertinent Vitals/ Pain       Pain Assessment: No/denies pain  Home Living  Prior Functioning/Environment              Frequency  Min 2X/week        Progress Toward Goals  OT Goals(current goals can now be found in the care plan section)  Progress towards OT goals: Progressing toward goals  Acute Rehab OT Goals Patient Stated Goal: To go home OT Goal Formulation: With patient/family Time For Goal Achievement: 02/08/19 Potential to Achieve Goals: Good  Plan Discharge plan remains appropriate    Co-evaluation                 AM-PAC OT "6 Clicks" Daily Activity     Outcome Measure   Help from another person eating meals?: None Help from another person taking care of personal grooming?: None Help from another  person toileting, which includes using toliet, bedpan, or urinal?: None Help from another person bathing (including washing, rinsing, drying)?: None Help from another person to put on and taking off regular upper body clothing?: None Help from another person to put on and taking off regular lower body clothing?: None 6 Click Score: 24    End of Session Equipment Utilized During Treatment: Rolling walker;Gait belt  OT Visit Diagnosis: Unsteadiness on feet (R26.81);Other abnormalities of gait and mobility (R26.89);Muscle weakness (generalized) (M62.81);Other symptoms and signs involving cognitive function   Activity Tolerance Patient tolerated treatment well   Patient Left in bed;with call bell/phone within reach;with nursing/sitter in room   Nurse Communication          Time: [id number] OT Time Calculation (min): 22 min  Charges: OT General Charges $OT Visit: 1 Visit OT Treatments $Self Care/Home Management : 8-22 mins  Nestor Lewandowsky, OTR/L Acute Rehabilitation Services Pager: [phone] Office: [phone]   Malka So 01/28/2019, 9:15 AM

## 2019-01-28 NOTE — Progress Notes (Addendum)
  Progress Note    01/28/2019 7:29 AM 6 Days Post-Op  Subjective:  No complaints  Tm 99.9   Vitals:   01/27/19 1938 01/28/19 0551  BP: (!) 140/54 (!) 141/45  Pulse: 78 64  Resp: (!) 21 (!) 23  Temp: 98.8 F (37.1 C) 99.9 F (37.7 C)  SpO2: 100% 100%    Physical Exam: Cardiac:  regular Lungs:  Non labored Incisions:  Clean and dry with some ecchymosis present Extremities:  RLE with brisk right PT doppler signal   CBC    Component Value Date/Time   WBC 7.6 01/26/2019 0402   RBC 2.40 (L) 01/26/2019 0402   HGB 8.2 (L) 01/27/2019 0100   HGB 13.0 12/17/2018 1006   HCT 24.6 (L) 01/27/2019 0100   HCT 38.8 12/17/2018 1006   PLT 102 (L) 01/26/2019 0402   PLT 231 12/17/2018 1006   MCV 93.3 01/26/2019 0402   MCV 89 12/17/2018 1006   MCH 30.4 01/26/2019 0402   MCHC 32.6 01/26/2019 0402   RDW 13.7 01/26/2019 0402   RDW 12.2 12/17/2018 1006   LYMPHSABS 1.6 12/17/2018 1006   MONOABS 0.9 11/07/2018 1314   EOSABS 0.1 12/17/2018 1006   BASOSABS 0.0 12/17/2018 1006    BMET    Component Value Date/Time   NA 137 01/26/2019 0402   NA 137 12/17/2018 1006   K 3.9 01/26/2019 0402   CL 106 01/26/2019 0402   CO2 24 01/26/2019 0402   GLUCOSE 135 (H) 01/26/2019 0402   BUN 13 01/26/2019 0402   BUN 23 12/17/2018 1006   CREATININE 0.87 01/26/2019 0402   CALCIUM 8.0 (L) 01/26/2019 0402   GFRNONAA >60 01/26/2019 0402   GFRAA >60 01/26/2019 0402    INR    Component Value Date/Time   INR 1.1 01/22/2019 0728     Intake/Output Summary (Last 24 hours) at 01/28/2019 0729 Last data filed at 01/27/2019 2248 Gross per 24 hour  Intake 793 ml  Output -  Net 793 ml     Assessment:  78 y.o. 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 -will arrange f/u with Dr. Scot Dock in a couple of weeks.     Leontine Locket, PA-C  [facility] [phone] 01/28/2019 7:29 AM  I have interviewed the patient and examined the patient. I agree with the findings by the PA. OK for discharge from my standpoint.   Gae Gallop, MD [phone]

## 2019-01-28 NOTE — Discharge Summary (Addendum)
[place] VALVE TEAM  Discharge Summary    Patient ID: VEAR MCAULAY MRN: [id number]; DOB: 1941/02/16  Admit date: 01/22/2019 Discharge date: 01/28/2019  Primary Care Provider: Merrilee Seashore, MD  Primary Cardiologist: Lauree Chandler, MD  Discharge Diagnoses    Principal Problem:   S/P TAVR (transcatheter aortic valve replacement) Active Problems:   PAF (paroxysmal atrial fibrillation) (HCC)   S/P mitral valve repair   S/P Maze operation for atrial fibrillation   S/P TVR (tricuspid valve repair)   HTN (hypertension)   Severe aortic stenosis   Type 2 diabetes mellitus with complication, with long-term current use of insulin (HCC)   Groin hematoma with urgent evacuation 01/23/19   Acute CVA (cerebrovascular accident) ([place])   Acute blood loss anemia, transfused 2 units PRBC total this admit   Allergies Allergies  Allergen Reactions   Metformin And Related Diarrhea   Ultram [Tramadol] Nausea And Vomiting   Nickel Rash    Pt unable to wear jewelry made of nickel.   Pneumococcal Vaccines Swelling and Rash    At injection set    Diagnostic Studies/Procedures    TAVR OPERATIVE NOTE   Date of Procedure:01/22/2019  Preoperative Diagnosis:Severe Aortic Stenosis   Postoperative Diagnosis:Same   Procedure:   Transcatheter Aortic Valve Replacement - PercutaneousRightTransfemoral Approach Edwards Sapien 3 Ultra THV (size 74mm, model # 9750TFX, serial # [id number])  Co-Surgeons:Clarence H. Roxy Manns, MD and Lauree Chandler, MD  Anesthesiologist:Chris Ermalene Postin, MD  Echocardiographer:Mihai Croitoru, MD  Pre-operative Echo Findings: ? Severe aortic stenosis ? Normalleft ventricular systolic function  Post-operative Echo Findings: ? Mildparavalvular leak ? Normalleft ventricular systolic  function   _________________   Echo 01/23/19: IMPRESSIONS 1. Left ventricular ejection fraction, by visual estimation, is 65 to 70%. The left ventricle has normal function. Normal left ventricular size. There is no left ventricular hypertrophy. 2. Left ventricular diastolic Doppler parameters are consistent with impaired relaxation pattern of LV diastolic filling. 3. Global right ventricle has normal systolic function.The right ventricular size is normal. No increase in right ventricular wall thickness. 4. Left atrial size was normal. 5. Right atrial size was normal. 6. Moderate mitral annular calcification. 7. The mitral valve is normal in structure. Mild mitral valve regurgitation. Mild mitral stenosis. 8. The tricuspid valve is normal in structure. Tricuspid valve regurgitation is trivial. 9. Aortic valve regurgitation is mild by color flow Doppler. Mild aortic valve stenosis. 10. Mild perivalvular regurgitation. 11. The pulmonic valve was normal in structure. Pulmonic valve regurgitation is not visualized by color flow Doppler. 12. Normal pulmonary artery systolic pressure. 13. The inferior vena cava is normal in size with greater than 50% respiratory variability, suggesting right atrial pressure of 3 mmHg.  _________________   01/24/19 MRI IMPRESSION: 1. Multifocal acute ischemia scattered within both hemispheres, left-greater-than-right, and in the left cerebellum. The pattern is most consistent with a central embolic source, particularly in a patient with a history of atrial fibrillation. 2. No emergent large vessel occlusion or high-grade stenosis. 3. Mild intracranial atherosclerosis.    History of Present Illness     Rachel Santos is a 78 y.o. 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  Patient's cardiac history dates back to 2014 when she presented with TIA in the setting of newly diagnosed paroxysmal atrial fibrillation. She was  found to have severe mitral regurgitation and moderate tricuspid regurgitation and underwent mitral valve repair, tricuspid valve repair, and Maze procedure on October 11, 2012. 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 She also underwent tricuspid ring annuloplasty and maze procedure at that time. Her postoperative convalescence was notable only for some transient bradycardia and she was evaluated by Dr. Caryl Comes who did not feel that permanent pacemaker implantation was necessary. She otherwise did well and was last seen here in our office on December 09, 2013 at which time she was doing well and maintaining sinus rhythm. She has been followed intermittently ever since by Dr. Angelena Form and clinically has done well until recently.  On 11/07/18 the patient had a syncopal event. Follow up TTE with normal left ventricular systolic function with intact mitral valve repair and tricuspid valve repair but at least moderate if not severe aortic stenosis,although transvalvular gradients were felt to be somewhat difficult to assess. Followed up with Dr. Angelena Form and plan for TEE, though cancelled several times due to hypotension. She underwent cardiac cath 12/24/18 Attempts across aortic valve by catheterization were unsuccessful. The patient was noted to have mild nonobstructive coronary disease in the left anterior descending and left circumflex territories with moderate nonobstructive disease in the right coronary artery territory. Right heart pressures were mild to moderately elevated.   The patient has been evaluated by the multidisciplinary valve team and felt to have severe, symptomatic aortic stenosis and to be a suitable  candidate for TAVR, which was set up for 01/22/19.    Hospital Course     Consultants: vascular, neuro  Severe AS:s/p successful TAVR with a 23 mm Edwards Sapien 3 THV via the TF approach on 01/22/19. Post operative echo showed EF 65-70%, normally functioning TAVR with mean gradient of 13 mm Hg and mild PVL. Groin sites are stable. Right groin with hematoma and ecchymosis. Now back on home dose of Xarelto 15 mg daily. Will hold ASA for now as well given hematoma.   Right groin hematoma: unfortunately, she developed an extensive right groin hematoma after ambulation on the night of the procedure. She was taken to the OR for emergent exploration. The hematoma was evacuated. Perclose sutures were partially caught on the inguinal ligament. She underwent successful repair of the right common femoral artery. JP drain out. Vascular has cleared her for DC and will follow as an outpatient.   Acute blood loss anemia: Hgb dropped to 6.9. She is s/p 2U PRBCs. Now Hg stable at 9.0. Will follow in the outpatient setting.   Acute CVA: patient with TIA the evening of the TAVR procedure and then then waxing and waning mental status, facial droop and aphagia the night of the TAVR procedure.  tPAnot administereddue to right groin hematoma s/psurgical intervention. Repeat CT head showedno new hemorrhage or large stroke. She was started on a heparin drip. Subsequent MRA showed multifocal acute ischemia scattered within both hemispheres, left-greater-than-right, and in the left cerebellum. The pattern is most consistent with a central embolic source, particularly in a patient with a history of atrial fibrillation. She is now back on home dose of Xarelto. Appreciate neurology assistance.  -Pt back to baseline neuro status.  - Home health PT/OT/SLP/RN ordered at DC.  HTN: BP well controlled. Resumed on home meds.   DMT2: treated with SSI while admitted. Resume home regimen at DC.  Hx of mitral/tricuspid  valve ring  w/ MAZE (2014): this has been stable by echocardiography  PAF: had transient runs of SVT vs atrial fibrillation. Overall, mostly maintaining sinus with some ectopy during admission. Back on Xarelto 15 mg daily without aspirin.   Fever: she has had low grade temps with a flat curve (peak 99.7 deg F). Repeat UA negative for UTI and blood cultures negative.   Incidental adnexal lesion: pre TAVR CT scan showed a right adnexal cystic lesion. Non emergent pelvic US recommended for follow up. Will discuss in the outpatient setting.  _____________  Discharge Vitals Blood pressure (!) 150/52, pulse 64, temperature 99.4 F (37.4 C), temperature source Oral, resp. rate 20, height 5\' 6"  (1.676 m), weight 68.8 kg, SpO2 100 %.  Filed Weights   01/25/19 0417 01/27/19 0434 01/28/19 0551  Weight: 69 kg 67.2 kg 68.8 kg    GEN: Well nourished, well developed, in no acute distress HEENT: normal Neck: no JVD or masses Cardiac: RRR; soft flow murmur. No rubs, or gallops,no edema  Respiratory:  clear to auscultation bilaterally, normal work of breathing GI: soft, nontender, nondistended, + BS MS: no deformity or atrophy Skin: warm and dry, no rash. Extensive right groin hematoma and ecchymosis.  Neuro:  Alert and Oriented x 3, Strength and sensation are intact Psych: euthymic mood, full affect    Labs & Radiologic Studies    CBC Recent Labs    01/26/19 0402 01/27/19 0100 01/28/19 0927  WBC 7.6  --  7.9  HGB 7.3* 8.2* 9.0*  HCT 22.4* 24.6* 26.7*  MCV 93.3  --  90.8  PLT 102*  --  [id number]*   Basic Metabolic Panel Recent Labs    01/26/19 0402 01/28/19 0927  NA 137 132*  K 3.9 4.1  CL 106 99  CO2 24 23  GLUCOSE 135* 252*  BUN 13 16  CREATININE 0.87 0.75  CALCIUM 8.0* 8.3*   Liver Function Tests No results for input(s): AST, ALT, ALKPHOS, BILITOT, PROT, ALBUMIN in the last 72 hours. No results for input(s): LIPASE, AMYLASE in the last 72 hours. Cardiac Enzymes No results for  input(s): CKTOTAL, CKMB, CKMBINDEX, TROPONINI in the last 72 hours. BNP Invalid input(s): POCBNP D-Dimer No results for input(s): DDIMER in the last 72 hours. Hemoglobin A1C No results for input(s): HGBA1C in the last 72 hours. Fasting Lipid Panel No results for input(s): CHOL, HDL, LDLCALC, TRIG, CHOLHDL, LDLDIRECT in the last 72 hours. Thyroid Function Tests No results for input(s): TSH, T4TOTAL, T3FREE, THYROIDAB in the last 72 hours.  Invalid input(s): FREET3 _____________  Dg Chest 2 View  Result Date: 01/18/2019 CLINICAL DATA:  Aortic valve replacement. EXAM: CHEST - 2 VIEW COMPARISON:  01/02/2019. FINDINGS: Prior cardiac valve replacement. Left atrial appendage clip noted in stable position. Heart size normal. No focal infiltrate. No pleural effusion or pneumothorax. Degenerative changes scoliosis thoracic spine. Old right rib fractures noted. IMPRESSION: 1. Prior cardiac valve replacement. Left atrial appendage clip noted stable position. Heart size stable. 2.  No acute pulmonary disease. Electronically Signed   By: Marcello Moores  Register   On: 01/18/2019 10:22   Ct Head Wo Contrast  Result Date: 01/23/2019 CLINICAL DATA:  Speech difficulty, aphasia. EXAM: CT HEAD WITHOUT CONTRAST TECHNIQUE: Contiguous axial images were obtained from the base of the skull through the vertex without intravenous contrast. COMPARISON:  Head CT performed earlier the same day 01/23/2019, brain MRI/MRA 09/27/2012 FINDINGS: Brain: No evidence of acute intracranial hemorrhage. No demarcated cortical infarction. No evidence of intracranial mass. No midline shift  or extra-axial fluid collection. Mild generalized parenchymal atrophy. Vascular: No definite hyperdense vessel. Atherosclerotic calcification of the carotid artery siphons and vertebrobasilar system. Skull: Normal. Negative for fracture or focal lesion. Sinuses/Orbits: Visualized orbits demonstrate no acute abnormality. No significant paranasal sinus disease  or mastoid effusion. IMPRESSION: 1. No CT evidence of acute intracranial abnormality. 2. Mild generalized parenchymal atrophy. Electronically Signed   By: Kellie Simmering DO   On: 01/23/2019 21:27   Mr Angio Head Wo Contrast  Result Date: 01/24/2019 CLINICAL DATA:  Right facial droop and sudden onset difficulty speaking. EXAM: MRI HEAD WITHOUT CONTRAST MRA HEAD WITHOUT CONTRAST TECHNIQUE: Multiplanar, multiecho pulse sequences of the brain and surrounding structures were obtained without intravenous contrast. Angiographic images of the head were obtained using MRA technique without contrast. COMPARISON:  None. FINDINGS: MRI HEAD FINDINGS BRAIN: There are scattered punctate foci of acute ischemia, predominantly within the left hemisphere, but there are at least 2 sites in the right hemisphere. The greatest concentration of lesions is along the left precentral gyrus. There also lesions in the left parietal lobe and left cerebellum. The midline structures are normal. Minimal white matter hyperintensity, nonspecific and commonly seen in asymptomatic patients of this age. The CSF spaces are normal for age, with no hydrocephalus. Susceptibility-sensitive sequences show no chronic microhemorrhage or superficial siderosis. SKULL AND UPPER CERVICAL SPINE: The visualized skull base, calvarium, upper cervical spine and extracranial soft tissues are normal. SINUSES/ORBITS: No fluid levels or advanced mucosal thickening. No mastoid or middle ear effusion. The orbits are normal. MRA HEAD FINDINGS POSTERIOR CIRCULATION: --Basilar artery: Normal. --Posterior cerebral arteries: Normal. Both originate from the basilar artery. There is a left P-comm. Right P-comm is diminutive or absent. --Superior cerebellar arteries: Normal. --Inferior cerebellar arteries: Normal anterior and posterior inferior cerebellar arteries. ANTERIOR CIRCULATION: --Intracranial internal carotid arteries: Mild atherosclerotic narrowing. --Anterior cerebral  arteries: Normal. Both A1 segments are present. Patent anterior communicating artery. --Middle cerebral arteries: Normal. IMPRESSION: 1. Multifocal acute ischemia scattered within both hemispheres, left-greater-than-right, and in the left cerebellum. The pattern is most consistent with a central embolic source, particularly in a patient with a history of atrial fibrillation. 2. No emergent large vessel occlusion or high-grade stenosis. 3. Mild intracranial atherosclerosis. Electronically Signed   By: Ulyses Jarred M.D.   On: 01/24/2019 03:12   Mr Brain Wo Contrast  Result Date: 01/24/2019 CLINICAL DATA:  Right facial droop and sudden onset difficulty speaking. EXAM: MRI HEAD WITHOUT CONTRAST MRA HEAD WITHOUT CONTRAST TECHNIQUE: Multiplanar, multiecho pulse sequences of the brain and surrounding structures were obtained without intravenous contrast. Angiographic images of the head were obtained using MRA technique without contrast. COMPARISON:  None. FINDINGS: MRI HEAD FINDINGS BRAIN: There are scattered punctate foci of acute ischemia, predominantly within the left hemisphere, but there are at least 2 sites in the right hemisphere. The greatest concentration of lesions is along the left precentral gyrus. There also lesions in the left parietal lobe and left cerebellum. The midline structures are normal. Minimal white matter hyperintensity, nonspecific and commonly seen in asymptomatic patients of this age. The CSF spaces are normal for age, with no hydrocephalus. Susceptibility-sensitive sequences show no chronic microhemorrhage or superficial siderosis. SKULL AND UPPER CERVICAL SPINE: The visualized skull base, calvarium, upper cervical spine and extracranial soft tissues are normal. SINUSES/ORBITS: No fluid levels or advanced mucosal thickening. No mastoid or middle ear effusion. The orbits are normal. MRA HEAD FINDINGS POSTERIOR CIRCULATION: --Basilar artery: Normal. --Posterior cerebral arteries: Normal.  Both originate from the basilar  artery. There is a left P-comm. Right P-comm is diminutive or absent. --Superior cerebellar arteries: Normal. --Inferior cerebellar arteries: Normal anterior and posterior inferior cerebellar arteries. ANTERIOR CIRCULATION: --Intracranial internal carotid arteries: Mild atherosclerotic narrowing. --Anterior cerebral arteries: Normal. Both A1 segments are present. Patent anterior communicating artery. --Middle cerebral arteries: Normal. IMPRESSION: 1. Multifocal acute ischemia scattered within both hemispheres, left-greater-than-right, and in the left cerebellum. The pattern is most consistent with a central embolic source, particularly in a patient with a history of atrial fibrillation. 2. No emergent large vessel occlusion or high-grade stenosis. 3. Mild intracranial atherosclerosis. Electronically Signed   By: Ulyses Jarred M.D.   On: 01/24/2019 03:12   Ct Coronary Morph W/cta Cor W/score W/ca W/cm &/or Wo/cm  Addendum Date: 01/02/2019   ADDENDUM REPORT: 01/02/2019 12:19 CLINICAL DATA:  Aortic stenosis EXAM: Cardiac TAVR CT TECHNIQUE: The patient was scanned on a Siemens Force [id number] slice scanner. A 120 kV retrospective scan was triggered in the descending thoracic aorta at 111 HU's. Gantry rotation speed was 270 msecs and collimation was .9 mm. No beta blockade or nitro were given. The 3D data set was reconstructed in 5% intervals of the R-R cycle. Systolic and diastolic phases were analyzed on a dedicated work station using MPR, MIP and VRT modes. The patient received 80 cc of contrast. FINDINGS: Aortic Valve: Tri leaflet calcified with restricted leaflet motion Aorta: Bovine arch no aneurysm moderate calcific atherosclerosis Sinotubular Junction: 24 mm Ascending Thoracic Aorta: 32 mm Aortic Arch: 25 mm Descending Thoracic Aorta: 23 mm Sinus of Valsalva Measurements: Non-coronary: 27 mm Right - coronary: 26.4 mm Left - coronary: 28.6 mm Coronary Artery Height above Annulus: Left  Main: 12.9 mm above annulus Right Coronary: 12.4 mm above annulus Virtual Basal Annulus Measurements: Maximum/Minimum Diameter: 21.4 mm x 26.9 mm Perimeter: 76.4 mm Area: 417 mm2 Coronary Arteries: Sufficient height above annulus for deployment Optimum Fluoroscopic Angle for Delivery: LAO 19 Cranial 2 IMPRESSION: 1. Calcified tri leaflet AV with annular area of 417 mm2 suitable for a 23 mm Sapien 3 valve 2.  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  Coronary arteries sufficient height above annulus for deployment 4. Optimum angiographic angle for deployment LAO 19 Cranial 2 degrees 5. Patient is post MV annuloplasty and TV annuloplasty repair LAA has been clipped closed with no residual communication with LA [place] Electronically Signed   By: Jenkins Rouge M.D.   On: 01/02/2019 12:19   Result Date: 01/02/2019 EXAM: OVER-READ INTERPRETATION  CT CHEST The following report is an over-read performed by radiologist Dr. Vinnie Langton of [facility], [place] on 01/02/2019. This over-read does not include interpretation of cardiac or coronary anatomy or pathology. The coronary calcium score/coronary CTA interpretation by the cardiologist is attached. COMPARISON:  Chest CTA 09/27/2012. FINDINGS: Extracardiac findings will be described separately under dictation for contemporaneously obtained CTA chest, abdomen and pelvis. IMPRESSION: Please see separate dictation for contemporaneously obtained CTA chest, abdomen and pelvis dated 01/02/2019 for full description of relevant extracardiac findings. Electronically Signed: By: Vinnie Langton M.D. On: 01/02/2019 11:48   Dg Chest Port 1 View  Result Date: 01/22/2019 CLINICAL DATA:  Status post TAVR EXAM: PORTABLE CHEST 1 VIEW COMPARISON:  January 18, 2019 FINDINGS: The heart size and mediastinal contours are unchanged. Prior aortic valve replacement is seen. Left atrial appendage clip is noted. Aortic knob calcifications. Overlying median sternotomy wires. A  right-sided central venous catheter seen with the tip at the superior cavoatrial junction. No pneumothorax. The lungs  are clear. No pleural effusion. IMPRESSION: No acute cardiopulmonary process. Electronically Signed   By: Prudencio Pair M.D.   On: 01/22/2019 15:30   Ct Angio Chest Aorta W &/or Wo Contrast  Result Date: 01/02/2019 CLINICAL DATA:  78 year old female with history of severe aortic stenosis. Preprocedural study prior to potential transcatheter aortic valve replacement (TAVR) procedure. EXAM: CT ANGIOGRAPHY CHEST, ABDOMEN AND PELVIS TECHNIQUE: Multidetector CT imaging through the chest, abdomen and pelvis was performed using the standard protocol during bolus administration of intravenous contrast. Multiplanar reconstructed images and MIPs were obtained and reviewed to evaluate the vascular anatomy. CONTRAST:  134mL OMNIPAQUE IOHEXOL 350 MG/ML SOLN COMPARISON:  CTA chest 09/27/2012. FINDINGS: CTA CHEST FINDINGS Cardiovascular: Heart size is enlarged. There is no significant pericardial fluid, thickening or pericardial calcification. There is aortic atherosclerosis, as well as atherosclerosis of the great vessels of the mediastinum and the coronary arteries, including calcified atherosclerotic plaque in the left main, left anterior descending, left circumflex and right coronary arteries. Thickening calcification of the aortic valve. Status post mitral and tricuspid annuloplasty. Mediastinum/Lymph Nodes: No pathologically enlarged mediastinal or hilar lymph nodes. Esophagus is unremarkable in appearance. No axillary lymphadenopathy. Lungs/Pleura: No acute consolidative airspace disease. No pleural effusions. No suspicious appearing pulmonary nodules or masses are noted. Musculoskeletal/Soft Tissues: Status post left modified radical mastectomy. Median sternotomy wires. There are no aggressive appearing lytic or blastic lesions noted in the visualized portions of the skeleton. CTA ABDOMEN AND PELVIS  FINDINGS Hepatobiliary: No suspicious cystic or solid hepatic lesions. No intra or extrahepatic biliary ductal dilatation. 11 mm calcified gallstone in the neck of the gallbladder. No findings to suggest an acute cholecystitis at this time. Pancreas: Pancreatic atrophy. No pancreatic mass. No pancreatic ductal dilatation. No pancreatic or peripancreatic fluid collections or inflammatory changes. Spleen: Unremarkable. Adrenals/Urinary Tract: Subcentimeter low-attenuation lesion in the lower pole of the right kidney, too small to characterize, but statistically likely to represent a tiny cyst. Left kidney and bilateral adrenal glands are normal in appearance. No hydroureteronephrosis. Urinary bladder is normal in appearance. Stomach/Bowel: Normal appearance of the stomach. No pathologic dilatation of small bowel or colon. Numerous colonic diverticulae are noted, without surrounding inflammatory changes to suggest an acute diverticulitis at this time. The appendix is not confidently identified and may be surgically absent. Regardless, there are no inflammatory changes noted adjacent to the cecum to suggest the presence of an acute appendicitis at this time. Vascular/Lymphatic: Aortic atherosclerosis, without evidence of aneurysm or dissection in the abdominal or pelvic vasculature. Vascular findings and measurements pertinent to potential T AVR procedure, as detailed below. No lymphadenopathy noted in the abdomen or pelvis. Reproductive: Uterus and left ovary are atrophic. In the right adnexal region there is a 4.9 x 3.6 x 4.7 cm low-attenuation lesion, likely to represent an ovarian cyst. Other: No significant volume of ascites.  No pneumoperitoneum. Musculoskeletal: There are no aggressive appearing lytic or blastic lesions noted in the visualized portions of the skeleton. VASCULAR MEASUREMENTS PERTINENT TO TAVR: AORTA: Minimal Aortic Diameter-12 x 10 mm Severity of Aortic Calcification-moderate to severe RIGHT  PELVIS: Right Common Iliac Artery - Minimal Diameter-7.4 x 7.1 mm Tortuosity - mild Calcification-moderate Right External Iliac Artery - Minimal Diameter-6.9 x 6.8 mm Tortuosity - mild Calcification-none Right Common Femoral Artery - Minimal Diameter-7.7 x 7.1 mm Tortuosity - mild Calcification-mild LEFT PELVIS: Left Common Iliac Artery - Minimal Diameter-8.5 x 7.8 mm Tortuosity - mild Calcification-moderate Left External Iliac Artery - Minimal Diameter-7.1 x 7.3 mm Tortuosity - mild  Calcification-none Left Common Femoral Artery - Minimal Diameter-7.3 x 6.4 mm Tortuosity - mild Calcification-mild Review of the MIP images confirms the above findings. IMPRESSION: 1. Vascular findings and measurements pertinent to potential TAVR procedure, as detailed below. 2. Thickening 2 calcification of the aortic valve, compatible with the reported clinical history of severe aortic stenosis. 3. Aortic atherosclerosis, in addition to left main and 3 vessel coronary artery disease. 4. Mild cardiomegaly. 5. Cholelithiasis without evidence of acute cholecystitis at this time. 6. Colonic diverticulosis without evidence of acute diverticulitis. 7. Cystic lesion in the right adnexal region measuring 4.9 x 3.6 x 4.7 cm. Statistically, this is likely to represent a cyst, however, further evaluation with nonemergent pelvic ultrasound is recommended in the near future to better evaluate this lesion. This recommendation follows ACR consensus guidelines: Management of Incidental Pancreatic Cysts: A White Paper of the ACR Incidental Findings Committee. [place] [id number]. 8. Additional incidental findings, as above. Electronically Signed   By: Vinnie Langton M.D.   On: 01/02/2019 13:42   Ct Head Code Stroke Wo Contrast  Result Date: 01/23/2019 CLINICAL DATA:  Code stroke.  Speech disturbance. Weakness. EXAM: CT HEAD WITHOUT CONTRAST TECHNIQUE: Contiguous axial images were obtained from the base of the skull through the  vertex without intravenous contrast. COMPARISON:  11/07/2018 FINDINGS: Brain: Mild age related atrophy. No evidence of old or acute focal infarction, mass lesion, hemorrhage, hydrocephalus or extra-axial collection. Vascular: There is atherosclerotic calcification of the major vessels at the base of the brain. No sign of acute hyperdense vessel. Skull: Negative Sinuses/Orbits: Clear/normal Other: None ASPECTS ([facility] Stroke Program Early CT Score) - Ganglionic level infarction (caudate, lentiform nuclei, internal capsule, insula, M1-M3 cortex): 7 - Supraganglionic infarction (M4-M6 cortex): 3 Total score (0-10 with 10 being normal): 10 IMPRESSION: No acute finding by CT. Mild age related atrophy. Atherosclerotic calcification of the major vessels at the base of the brain. *ASPECTS is 10. * These results were communicated to Dr. Leonie Man at 2:26 pmon 10/28/2020by text page via the [facility] messaging system. Electronically Signed   By: Nelson Chimes M.D.   On: 01/23/2019 14:28   Vas US Carotid  Result Date: 01/02/2019 Carotid Arterial Duplex Study Indications:       Pre TAVR. Risk Factors:      Hypertension, Diabetes. Other Factors:     Hx TIA. Comparison Study:  no prior Performing Technologist: June Leap RDMS, RVT  Examination Guidelines: A complete evaluation includes B-mode imaging, spectral Doppler, color Doppler, and power Doppler as needed of all accessible portions of each vessel. Bilateral testing is considered an integral part of a complete examination. Limited examinations for reoccurring indications may be performed as noted.  Right Carotid Findings: +----------+--------+--------+--------+------------------+--------+             PSV cm/s EDV cm/s Stenosis Plaque Description Comments  +----------+--------+--------+--------+------------------+--------+  CCA Prox   62       16                heterogenous                 +----------+--------+--------+--------+------------------+--------+  CCA Distal 73        14                                             +----------+--------+--------+--------+------------------+--------+  ICA Prox   78  23       1-39%    heterogenous                 +----------+--------+--------+--------+------------------+--------+  ICA Distal 90       17                                             +----------+--------+--------+--------+------------------+--------+  ECA        69       5                                              +----------+--------+--------+--------+------------------+--------+ +----------+--------+-------+----------------+-------------------+             PSV cm/s EDV cms Describe         Arm Pressure (mmHG)  +----------+--------+-------+----------------+-------------------+  Subclavian 77               Multiphasic, WNL                      +----------+--------+-------+----------------+-------------------+ +---------+--------+--+--------+--+---------+  Vertebral PSV cm/s 55 EDV cm/s 12 Antegrade  +---------+--------+--+--------+--+---------+  Left Carotid Findings: +----------+--------+--------+--------+------------------+--------+             PSV cm/s EDV cm/s Stenosis Plaque Description Comments  +----------+--------+--------+--------+------------------+--------+  CCA Prox   76       16                                             +----------+--------+--------+--------+------------------+--------+  CCA Distal 52       16                                             +----------+--------+--------+--------+------------------+--------+  ICA Prox   54       16       1-39%    heterogenous                 +----------+--------+--------+--------+------------------+--------+  ICA Distal 92       25                                             +----------+--------+--------+--------+------------------+--------+  ECA        84       9                                              +----------+--------+--------+--------+------------------+--------+  +----------+--------+--------+----------------+-------------------+             PSV cm/s EDV cm/s Describe         Arm Pressure (mmHG)  +----------+--------+--------+----------------+-------------------+  Subclavian 190               Multiphasic, WNL                      +----------+--------+--------+----------------+-------------------+ +---------+--------+--+--------+--+---------+  Vertebral PSV cm/s 71 EDV cm/s 14 Antegrade  +---------+--------+--+--------+--+---------+  Summary: Right Carotid: Velocities in the right ICA are consistent with a 1-39% stenosis. Left Carotid: Velocities in the left ICA are consistent with a 1-39% stenosis.  *See table(s) above for measurements and observations.  Electronically signed by Curt Jews MD on 01/02/2019 at 4:38:44 PM.    Final    Ct Angio Abdomen Pelvis  W &/or Wo Contrast  Result Date: 01/02/2019 CLINICAL DATA:  78 year old female with history of severe aortic stenosis. Preprocedural study prior to potential transcatheter aortic valve replacement (TAVR) procedure. EXAM: CT ANGIOGRAPHY CHEST, ABDOMEN AND PELVIS TECHNIQUE: Multidetector CT imaging through the chest, abdomen and pelvis was performed using the standard protocol during bolus administration of intravenous contrast. Multiplanar reconstructed images and MIPs were obtained and reviewed to evaluate the vascular anatomy. CONTRAST:  170mL OMNIPAQUE IOHEXOL 350 MG/ML SOLN COMPARISON:  CTA chest 09/27/2012. FINDINGS: CTA CHEST FINDINGS Cardiovascular: Heart size is enlarged. There is no significant pericardial fluid, thickening or pericardial calcification. There is aortic atherosclerosis, as well as atherosclerosis of the great vessels of the mediastinum and the coronary arteries, including calcified atherosclerotic plaque in the left main, left anterior descending, left circumflex and right coronary arteries. Thickening calcification of the aortic valve. Status post mitral and tricuspid annuloplasty.  Mediastinum/Lymph Nodes: No pathologically enlarged mediastinal or hilar lymph nodes. Esophagus is unremarkable in appearance. No axillary lymphadenopathy. Lungs/Pleura: No acute consolidative airspace disease. No pleural effusions. No suspicious appearing pulmonary nodules or masses are noted. Musculoskeletal/Soft Tissues: Status post left modified radical mastectomy. Median sternotomy wires. There are no aggressive appearing lytic or blastic lesions noted in the visualized portions of the skeleton. CTA ABDOMEN AND PELVIS FINDINGS Hepatobiliary: No suspicious cystic or solid hepatic lesions. No intra or extrahepatic biliary ductal dilatation. 11 mm calcified gallstone in the neck of the gallbladder. No findings to suggest an acute cholecystitis at this time. Pancreas: Pancreatic atrophy. No pancreatic mass. No pancreatic ductal dilatation. No pancreatic or peripancreatic fluid collections or inflammatory changes. Spleen: Unremarkable. Adrenals/Urinary Tract: Subcentimeter low-attenuation lesion in the lower pole of the right kidney, too small to characterize, but statistically likely to represent a tiny cyst. Left kidney and bilateral adrenal glands are normal in appearance. No hydroureteronephrosis. Urinary bladder is normal in appearance. Stomach/Bowel: Normal appearance of the stomach. No pathologic dilatation of small bowel or colon. Numerous colonic diverticulae are noted, without surrounding inflammatory changes to suggest an acute diverticulitis at this time. The appendix is not confidently identified and may be surgically absent. Regardless, there are no inflammatory changes noted adjacent to the cecum to suggest the presence of an acute appendicitis at this time. Vascular/Lymphatic: Aortic atherosclerosis, without evidence of aneurysm or dissection in the abdominal or pelvic vasculature. Vascular findings and measurements pertinent to potential T AVR procedure, as detailed below. No lymphadenopathy noted  in the abdomen or pelvis. Reproductive: Uterus and left ovary are atrophic. In the right adnexal region there is a 4.9 x 3.6 x 4.7 cm low-attenuation lesion, likely to represent an ovarian cyst. Other: No significant volume of ascites.  No pneumoperitoneum. Musculoskeletal: There are no aggressive appearing lytic or blastic lesions noted in the visualized portions of the skeleton. VASCULAR MEASUREMENTS PERTINENT TO TAVR: AORTA: Minimal Aortic Diameter-12 x 10 mm Severity of Aortic Calcification-moderate to severe RIGHT PELVIS: Right Common Iliac Artery - Minimal Diameter-7.4 x 7.1 mm Tortuosity - mild Calcification-moderate Right External Iliac Artery - Minimal Diameter-6.9 x 6.8 mm Tortuosity - mild Calcification-none Right Common Femoral  Artery - Minimal Diameter-7.7 x 7.1 mm Tortuosity - mild Calcification-mild LEFT PELVIS: Left Common Iliac Artery - Minimal Diameter-8.5 x 7.8 mm Tortuosity - mild Calcification-moderate Left External Iliac Artery - Minimal Diameter-7.1 x 7.3 mm Tortuosity - mild Calcification-none Left Common Femoral Artery - Minimal Diameter-7.3 x 6.4 mm Tortuosity - mild Calcification-mild Review of the MIP images confirms the above findings. IMPRESSION: 1. Vascular findings and measurements pertinent to potential TAVR procedure, as detailed below. 2. Thickening 2 calcification of the aortic valve, compatible with the reported clinical history of severe aortic stenosis. 3. Aortic atherosclerosis, in addition to left main and 3 vessel coronary artery disease. 4. Mild cardiomegaly. 5. Cholelithiasis without evidence of acute cholecystitis at this time. 6. Colonic diverticulosis without evidence of acute diverticulitis. 7. Cystic lesion in the right adnexal region measuring 4.9 x 3.6 x 4.7 cm. Statistically, this is likely to represent a cyst, however, further evaluation with nonemergent pelvic ultrasound is recommended in the near future to better evaluate this lesion. This recommendation follows  ACR consensus guidelines: Management of Incidental Pancreatic Cysts: A White Paper of the ACR Incidental Findings Committee. [place] [id number]. 8. Additional incidental findings, as above. Electronically Signed   By: Vinnie Langton M.D.   On: 01/02/2019 13:42   Disposition   Pt is being discharged home today in good condition.  Follow-up Plans & Appointments    Follow-up Information    [name], MD Follow up on 02/08/2019.   Specialty: Cardiology Why: at 1:40 Pm Contact information: [address] [place] [address] [phone]        Angelia Mould, MD Follow up.   Specialties: Vascular Surgery, Cardiology Why: his office will call you to arrange appt aoubt your Rt groin if you have not heard by Thursday call their office.  Contact information: [place] [place] [address] [phone]        [facility] Follow up.   Specialty: Home Health Services Why: Agency will contact you to start visits on Wednesday.  [id number]       Angelia Mould, MD In 2 weeks.   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   Pneumococcal Vaccines Swelling, Rash   At injection set      Medication List    STOP taking these medications   diclofenac sodium 1 % Gel Commonly known as: VOLTAREN   hydrocortisone 25 MG suppository Commonly known as: ANUSOL-HC     TAKE these medications   acetaminophen 500 MG tablet Commonly known as: TYLENOL Take 1,000 mg by mouth every 6 (six) hours as needed for moderate pain or headache. Notes to patient: Take as needed    busPIRone 10 MG tablet Commonly known as: BUSPAR Take 10 mg by mouth 2 (two) times daily. Notes to patient: Tonight at bedtime 11/1   Calcium Plus Vitamin D3 600-500 MG-UNIT Caps Generic drug: Calcium Carb-Cholecalciferol Take 1 tablet by mouth 2 (two) times daily. Notes to patient: Tonight at bedtime 11/2   DRY EYE RELIEF DROPS OP Place 1 drop into both eyes  daily as needed (dry eyes). Notes to patient: Take as needed   Levemir FlexPen 100 UNIT/ML Pen Generic drug: Insulin Detemir Inject 32 Units into the skin daily. Notes to patient: Take as you do at home    lovastatin 40 MG tablet Commonly known as: MEVACOR Take 2 tablets (80 mg total) by mouth at bedtime. Notes to patient: Tonight at bedtime 11/1   Melatonin 5 MG Caps Take 5 mg by mouth at bedtime as needed (sleep). Notes to patient: Take as needed for sleep   multivitamin with minerals Tabs tablet Take 1 tablet by mouth daily. Notes to patient: Tomorrow morning 11/1   omeprazole 40 MG capsule Commonly known as: PRILOSEC Take 40 mg by mouth daily before breakfast. Notes to patient: Tomorrow morning 11/2   Ozempic (0.25 or 0.5 MG/DOSE) 2 MG/1.5ML Sopn Generic drug: Semaglutide(0.25 or 0.5MG /DOS) Inject 0.5 mg into the skin every Sunday. Notes to patient: Take as you were at home   polyethylene glycol 17 g packet Commonly known as: MIRALAX / GLYCOLAX Take 17 g by mouth daily. Notes to patient: Tomorrow morning 11/2   Rivaroxaban 15 MG Tabs tablet Commonly known as: XARELTO Take 15 mg by mouth daily with supper. Notes to patient: Tonight 11/1   venlafaxine XR 75 MG 24 hr capsule Commonly known as: EFFEXOR-XR Take 75 mg by mouth daily with breakfast. Take with 150 mg capsule to equal 225 mg daily Notes to patient: Tomorrow morning 11/2   venlafaxine XR 150 MG 24 hr capsule Commonly known as: EFFEXOR-XR Take 150 mg by mouth daily with breakfast. Take with 75 mg capsule to equal 225 mg daily Notes to  patient: Tomorrow morning 11/2         Outstanding Labs/Studies  CBC  Duration of Discharge Encounter   Greater than 30 minutes including physician time.  Mable Fill, PA-C 01/28/2019, 10:43 AM [phone]  Patient seen, examined. Available data reviewed. Agree with findings, assessment, and plan as outlined by Nell Range, PA-C. 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 She has made an excellent recovery and is currently feeling well and stable for discharge. Follow-up as outlined above. Medications, vitals, and labs all reviewed and appear stable.   Sherren Mocha, M.D. 01/28/2019 5:31 PM

## 2019-01-28 NOTE — TOC Transition Note (Addendum)
Transition of Care [facility]) - CM/SW Discharge Note   Patient Details  Name: TEMITAYO KHOSLA MRN: [id number] Date of Birth: 04-11-1940  [facility] CM/SW Contact:  Bartholomew Crews, RN Phone Number: [phone] 01/28/2019, 10:36 AM   Clinical Narrative:    Received call back from [facility]. Unable to accept patient d/t capacity. Referral placed to [facility] - pending. Patient to transition home today.   Follow up call to [facility] - unable to accept case for [facility] services d/t limited capacity in the service area.   Referral placed to [facility]. Unable to accept d/t capacity in service area.   Referral placed to [facility] - pending.Marland Kitchen  Update - [facility] accepted [facility] case - skilled nurse to start care within 48 hours, however, therapy will not be able to start care until next week. Spoke with [name], one of patient's daughters, to advised of accepting agency and the timeline to expect for nursing and for therapy. Carla verbalized understanding.   Final next level of care: [place] Barriers to Discharge: No Barriers Identified   Patient Goals and CMS Choice Patient states their goals for this hospitalization and ongoing recovery are:: to be independent CMS Medicare.gov Compare Post Acute Care list provided to:: Patient Represenative (must comment)(daughter) Choice offered to / list presented to : Adult Children  Discharge Placement                       Discharge Plan and Services                          HH Arranged: PT, OT, Speech Therapy HH Agency: [facility]) Date [place]: 01/27/19 Time [place]: 1654 Representative spoke with at [place]: [place] ([place]) Interventions     Readmission Risk Interventions No flowsheet data found.

## 2019-01-28 NOTE — Progress Notes (Signed)
Patient in a stable condition, discharge education and stroke education reviwed with patient she verbalized understanding,tele dc ccmd notified, patient belongings at bedside, case manager Wendi said its ok for patient to go home and that  she will follow up with patient about her home health agency. Patient to be transported home by her daughter

## 2019-01-28 NOTE — Plan of Care (Signed)
  Problem: Education: Goal: Knowledge of General Education information will improve Description: Including pain rating scale, medication(s)/side effects and non-pharmacologic comfort measures Outcome: Completed/Met   Problem: Health Behavior/Discharge Planning: Goal: Ability to manage health-related needs will improve Outcome: Completed/Met   Problem: Clinical Measurements: Goal: Ability to maintain clinical measurements within normal limits will improve Outcome: Completed/Met Goal: Will remain free from infection Outcome: Completed/Met Goal: Diagnostic test results will improve Outcome: Completed/Met Goal: Respiratory complications will improve Outcome: Completed/Met Goal: Cardiovascular complication will be avoided Outcome: Completed/Met   Problem: Activity: Goal: Risk for activity intolerance will decrease Outcome: Completed/Met   Problem: Nutrition: Goal: Adequate nutrition will be maintained Outcome: Completed/Met   Problem: Coping: Goal: Level of anxiety will decrease Outcome: Completed/Met   Problem: Elimination: Goal: Will not experience complications related to bowel motility Outcome: Completed/Met Goal: Will not experience complications related to urinary retention Outcome: Completed/Met   Problem: Pain Managment: Goal: General experience of comfort will improve Outcome: Completed/Met   Problem: Safety: Goal: Ability to remain free from injury will improve Outcome: Completed/Met   Problem: Skin Integrity: Goal: Risk for impaired skin integrity will decrease Outcome: Completed/Met   Problem: Acute Rehab PT Goals(only PT should resolve) Goal: Pt Will Go Supine/Side To Sit Outcome: Completed/Met Goal: Patient Will Transfer Sit To/From Stand Outcome: Completed/Met Goal: Pt Will Ambulate Outcome: Completed/Met   Problem: Acute Rehab OT Goals (only OT should resolve) Goal: Pt. Will Perform Grooming Outcome: Completed/Met Goal: Pt. Will Perform Upper  Body Bathing Outcome: Completed/Met Goal: Pt. Will Perform Lower Body Bathing Outcome: Completed/Met Goal: Pt. Will Perform Upper Body Dressing Outcome: Completed/Met Goal: Pt. Will Perform Lower Body Dressing Outcome: Completed/Met Goal: Pt. Will Transfer To Toilet Outcome: Completed/Met Goal: Pt. Will Perform Toileting-Clothing Manipulation Outcome: Completed/Met Goal: OT Additional ADL Goal #1 Outcome: Completed/Met
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
## 2019-01-28 NOTE — Progress Notes (Signed)
CARDIAC REHAB PHASE I   PRE:  Rate/Rhythm: 77 SR    BP: sitting 137/50    SaO2: 100 RA  MODE:  Ambulation: 240 ft   POST:  Rate/Rhythm: 101 SR with PACs    BP: sitting 161/53     SaO2:   Pt much clearer today although does not remember OT seeing her a few minutes ago.  Able to walk in hallway with standby assist and RW. No major c/o. To BR after walk then walked to recliner without RW. She only used RW at home if going out. Discussed walking x3 a day at home. She is not interested in CRPII.  [id number]  Rachel Santos, ACSM 01/28/2019 9:57 AM

## 2019-01-28 NOTE — Discharge Instructions (Signed)
ACTIVITY AND EXERCISE °• Daily activity and exercise are an important part of your recovery. People recover at different rates depending on their general health and type of valve procedure. °• Most people recovering from TAVR feel better relatively quickly  °• No lifting, pushing, pulling more than 10 pounds (examples to avoid: groceries, vacuuming, gardening, golfing): °            - For one week with a procedure through the groin. °            - For six weeks for procedures through the chest wall or neck. °NOTE: You will typically see one of our providers 7-14 days after your procedure to discuss WHEN TO RESUME the above activities.  °  °  °DRIVING °• Do not drive until you are seen for follow up and cleared by a provider. Generally, we ask patient to not drive for 1 week after their procedure. °• If you have been told by your doctor in the past that you may not drive, you must talk with him/her before you begin driving again. °  °DRESSING °• Groin site: you may leave the clear dressing over the site for up to one week or until it falls off. °  °HYGIENE °• If you had a femoral (leg) procedure, you may take a shower when you return home. After the shower, pat the site dry. Do NOT use powder, oils or lotions in your groin area until the site has completely healed. °• If you had a chest procedure, you may shower when you return home unless specifically instructed not to by your discharging practitioner. °            - DO NOT scrub incision; pat dry with a towel. °            - DO NOT apply any lotions, oils, powders to the incision. °            - No tub baths / swimming for at least 2 weeks. °• If you notice any fevers, chills, increased pain, swelling, bleeding or pus, please contact your doctor. °  °ADDITIONAL INFORMATION °• If you are going to have an upcoming dental procedure, please contact our office as you will require antibiotics ahead of time to prevent infection on your heart valve.  ° ° °If you have any  questions or concerns you can call the structural heart phone during normal business hours 8am-4pm. If you have an urgent need after hours or weekends please call [phone] to talk to the on call provider for general cardiology. If you have an emergency that requires immediate attention, please call 911.  ° ° °After TAVR Checklist ° °Check  Test Description  ° Follow up appointment in 1-2 weeks  You will see our structural heart physician assistant, [name]. Your incision sites will be checked and you will be cleared to drive and resume all normal activities if you are doing well.    ° 1 month echo and follow up  You will have an echo to check on your new heart valve and be seen back in the office by [name]. Many times the echo is not read by your appointment time, but Katie will call you later that day or the following day to report your results.  ° Follow up with your primary cardiologist You will need to be seen by your primary cardiologist in the following 3-6 months after your 1 month appointment in the valve   clinic. Often times your Plavix or Aspirin will be discontinued during this time, but this is decided on a case by case basis.    1 year echo and follow up You will have another echo to check on your heart valve after 1 year and be seen back in the office by Nell Range. This your last structural heart visit.   Bacterial endocarditis prophylaxis  You will have to take antibiotics for the rest of your life before all dental procedures (even teeth cleanings) to protect your heart valve. Antibiotics are also required before some surgeries. Please check with your cardiologist before scheduling any surgeries. Also, please make sure to tell us if you have a penicillin allergy as you will require an alternative antibiotic.    Wash the groin wound with soap and water daily and pat dry. (No tub bath-only shower)  Then put a dry gauze or washcloth there to keep this area dry daily and as  needed.  Do not use Vaseline or neosporin on your incisions.  Only use soap and water on your incisions and then protect and keep dry.   ========================================================================================  Information on my medicine - XARELTO (Rivaroxaban)  Why was Xarelto prescribed for you? Xarelto was prescribed for you to reduce the risk of a blood clot forming that can cause a stroke if you have a medical condition called atrial fibrillation (a type of irregular heartbeat).  What do you need to know about xarelto ? Take your Xarelto ONCE DAILY at the same time every day with your evening meal. If you have difficulty swallowing the tablet whole, you may crush it and mix in applesauce just prior to taking your dose.  Take Xarelto exactly as prescribed by your doctor and DO NOT stop taking Xarelto without talking to the doctor who prescribed the medication.  Stopping without other stroke prevention medication to take the place of Xarelto may increase your risk of developing a clot that causes a stroke.  Refill your prescription before you run out.  After discharge, you should have regular check-up appointments with your healthcare provider that is prescribing your Xarelto.  In the future your dose may need to be changed if your kidney function or weight changes by a significant amount.  What do you do if you miss a dose? If you are taking Xarelto ONCE DAILY and you miss a dose, take it as soon as you remember on the same day then continue your regularly scheduled once daily regimen the next day. Do not take two doses of Xarelto at the same time or on the same day.   Important Safety Information A possible side effect of Xarelto is bleeding. You should call your healthcare provider right away if you experience any of the following: ? Bleeding from an injury or your nose that does not stop. ? Unusual colored urine (red or dark brown) or unusual colored stools  (red or black). ? Unusual bruising for unknown reasons. ? A serious fall or if you hit your head (even if there is no bleeding).  Some medicines may interact with Xarelto and might increase your risk of bleeding while on Xarelto. To help avoid this, consult your healthcare provider or pharmacist prior to using any new prescription or non-prescription medications, including herbals, vitamins, non-steroidal anti-inflammatory drugs (NSAIDs) and supplements.  This website has more information on Xarelto: [email or web address]

## 2019-01-29 ENCOUNTER — Telehealth: Payer: Self-pay | Admitting: Physician Assistant

## 2019-01-29 DIAGNOSIS — I2510 Atherosclerotic heart disease of native coronary artery without angina pectoris: Secondary | ICD-10-CM | POA: Diagnosis not present

## 2019-01-29 DIAGNOSIS — E1122 Type 2 diabetes mellitus with diabetic chronic kidney disease: Secondary | ICD-10-CM | POA: Diagnosis not present

## 2019-01-29 DIAGNOSIS — D500 Iron deficiency anemia secondary to blood loss (chronic): Secondary | ICD-10-CM | POA: Diagnosis not present

## 2019-01-29 DIAGNOSIS — N183 Chronic kidney disease, stage 3 unspecified: Secondary | ICD-10-CM | POA: Diagnosis not present

## 2019-01-29 DIAGNOSIS — I083 Combined rheumatic disorders of mitral, aortic and tricuspid valves: Secondary | ICD-10-CM | POA: Diagnosis not present

## 2019-01-29 DIAGNOSIS — Z794 Long term (current) use of insulin: Secondary | ICD-10-CM | POA: Diagnosis not present

## 2019-01-29 DIAGNOSIS — I480 Paroxysmal atrial fibrillation: Secondary | ICD-10-CM | POA: Diagnosis not present

## 2019-01-29 DIAGNOSIS — Z7901 Long term (current) use of anticoagulants: Secondary | ICD-10-CM | POA: Diagnosis not present

## 2019-01-29 DIAGNOSIS — I129 Hypertensive chronic kidney disease with stage 1 through stage 4 chronic kidney disease, or unspecified chronic kidney disease: Secondary | ICD-10-CM | POA: Diagnosis not present

## 2019-01-29 DIAGNOSIS — M170 Bilateral primary osteoarthritis of knee: Secondary | ICD-10-CM | POA: Diagnosis not present

## 2019-01-29 DIAGNOSIS — H259 Unspecified age-related cataract: Secondary | ICD-10-CM | POA: Diagnosis not present

## 2019-01-29 DIAGNOSIS — Z952 Presence of prosthetic heart valve: Secondary | ICD-10-CM | POA: Diagnosis not present

## 2019-01-29 DIAGNOSIS — I97638 Postprocedural hematoma of a circulatory system organ or structure following other circulatory system procedure: Secondary | ICD-10-CM | POA: Diagnosis not present

## 2019-01-29 NOTE — TOC Transition Note (Signed)
Transition of Care [facility]) - CM/SW Discharge Note   Patient Details  Name: Rachel Santos MRN: [id number] Date of Birth: February 18, 1941  Transition of Care [facility]) CM/SW Contact:  Bartholomew Crews, RN Phone Number: [phone] 01/29/2019, 3:28 PM   Clinical Narrative:    Received call from patient's daughter, [name], to inquire about [facility] coming for PT this morning, but then received call from [facility]. Verified miscommunication with [facility] who will continue for PT, OT, ST, and RN. [facility] made aware of miscommunication.    Final next level of care: [place] Barriers to Discharge: No Barriers Identified   Patient Goals and CMS Choice Patient states their goals for this hospitalization and ongoing recovery are:: to be independent CMS Medicare.gov Compare Post Acute Care list provided to:: Patient Represenative (must comment)(daughter) Choice offered to / list presented to : Adult Children  Discharge Placement                       Discharge Plan and Services                          HH Arranged: PT, OT, Speech Therapy HH Agency: [place] ([facility]) Date [place]: 01/27/19 Time [place]: [id number] Representative spoke with at [place]: [place] ([place]) Interventions     Readmission Risk Interventions No flowsheet data found.

## 2019-01-29 NOTE — Telephone Encounter (Signed)
  [place] VALVE TEAM   Patient contacted regarding discharge from [facility] on 11/2  Patient understands to follow up with provider, Dr. Angelena Form on 11/13 at [place].  Patient understands discharge instructions? yes Patient understands medications and regimen? yes Patient understands to bring all medications to this visit? yes   Patient was having some dizziness that improved. Also had some fuzziness in her eye that was also transient and resolved. She also had some high blood sugars. Daughter will call PCP office about this.   Angelena Form PA-C  [facility]

## 2019-01-30 ENCOUNTER — Ambulatory Visit: Payer: Medicare Other | Admitting: Physician Assistant

## 2019-01-30 DIAGNOSIS — M170 Bilateral primary osteoarthritis of knee: Secondary | ICD-10-CM | POA: Diagnosis not present

## 2019-01-30 DIAGNOSIS — Z7901 Long term (current) use of anticoagulants: Secondary | ICD-10-CM | POA: Diagnosis not present

## 2019-01-30 DIAGNOSIS — N183 Chronic kidney disease, stage 3 unspecified: Secondary | ICD-10-CM | POA: Diagnosis not present

## 2019-01-30 DIAGNOSIS — I2510 Atherosclerotic heart disease of native coronary artery without angina pectoris: Secondary | ICD-10-CM | POA: Diagnosis not present

## 2019-01-30 DIAGNOSIS — Z952 Presence of prosthetic heart valve: Secondary | ICD-10-CM | POA: Diagnosis not present

## 2019-01-30 DIAGNOSIS — I083 Combined rheumatic disorders of mitral, aortic and tricuspid valves: Secondary | ICD-10-CM | POA: Diagnosis not present

## 2019-01-30 DIAGNOSIS — I129 Hypertensive chronic kidney disease with stage 1 through stage 4 chronic kidney disease, or unspecified chronic kidney disease: Secondary | ICD-10-CM | POA: Diagnosis not present

## 2019-01-30 DIAGNOSIS — Z794 Long term (current) use of insulin: Secondary | ICD-10-CM | POA: Diagnosis not present

## 2019-01-30 DIAGNOSIS — D500 Iron deficiency anemia secondary to blood loss (chronic): Secondary | ICD-10-CM | POA: Diagnosis not present

## 2019-01-30 DIAGNOSIS — E1122 Type 2 diabetes mellitus with diabetic chronic kidney disease: Secondary | ICD-10-CM | POA: Diagnosis not present

## 2019-01-30 DIAGNOSIS — I480 Paroxysmal atrial fibrillation: Secondary | ICD-10-CM | POA: Diagnosis not present

## 2019-01-30 DIAGNOSIS — H259 Unspecified age-related cataract: Secondary | ICD-10-CM | POA: Diagnosis not present

## 2019-01-30 DIAGNOSIS — I97638 Postprocedural hematoma of a circulatory system organ or structure following other circulatory system procedure: Secondary | ICD-10-CM | POA: Diagnosis not present

## 2019-01-30 LAB — CULTURE, BLOOD (ROUTINE X 2)
Culture: NO GROWTH
Special Requests: ADEQUATE

## 2019-01-31 DIAGNOSIS — I97638 Postprocedural hematoma of a circulatory system organ or structure following other circulatory system procedure: Secondary | ICD-10-CM | POA: Diagnosis not present

## 2019-01-31 DIAGNOSIS — I129 Hypertensive chronic kidney disease with stage 1 through stage 4 chronic kidney disease, or unspecified chronic kidney disease: Secondary | ICD-10-CM | POA: Diagnosis not present

## 2019-01-31 DIAGNOSIS — I480 Paroxysmal atrial fibrillation: Secondary | ICD-10-CM | POA: Diagnosis not present

## 2019-01-31 DIAGNOSIS — D500 Iron deficiency anemia secondary to blood loss (chronic): Secondary | ICD-10-CM | POA: Diagnosis not present

## 2019-01-31 DIAGNOSIS — N183 Chronic kidney disease, stage 3 unspecified: Secondary | ICD-10-CM | POA: Diagnosis not present

## 2019-01-31 DIAGNOSIS — M170 Bilateral primary osteoarthritis of knee: Secondary | ICD-10-CM | POA: Diagnosis not present

## 2019-01-31 DIAGNOSIS — H259 Unspecified age-related cataract: Secondary | ICD-10-CM | POA: Diagnosis not present

## 2019-01-31 DIAGNOSIS — Z952 Presence of prosthetic heart valve: Secondary | ICD-10-CM | POA: Diagnosis not present

## 2019-01-31 DIAGNOSIS — Z794 Long term (current) use of insulin: Secondary | ICD-10-CM | POA: Diagnosis not present

## 2019-01-31 DIAGNOSIS — Z7901 Long term (current) use of anticoagulants: Secondary | ICD-10-CM | POA: Diagnosis not present

## 2019-01-31 DIAGNOSIS — E1122 Type 2 diabetes mellitus with diabetic chronic kidney disease: Secondary | ICD-10-CM | POA: Diagnosis not present

## 2019-01-31 DIAGNOSIS — I083 Combined rheumatic disorders of mitral, aortic and tricuspid valves: Secondary | ICD-10-CM | POA: Diagnosis not present

## 2019-01-31 DIAGNOSIS — I2510 Atherosclerotic heart disease of native coronary artery without angina pectoris: Secondary | ICD-10-CM | POA: Diagnosis not present

## 2019-02-01 ENCOUNTER — Other Ambulatory Visit: Payer: Self-pay

## 2019-02-01 DIAGNOSIS — I97638 Postprocedural hematoma of a circulatory system organ or structure following other circulatory system procedure: Secondary | ICD-10-CM | POA: Diagnosis not present

## 2019-02-01 DIAGNOSIS — D500 Iron deficiency anemia secondary to blood loss (chronic): Secondary | ICD-10-CM | POA: Diagnosis not present

## 2019-02-01 DIAGNOSIS — I129 Hypertensive chronic kidney disease with stage 1 through stage 4 chronic kidney disease, or unspecified chronic kidney disease: Secondary | ICD-10-CM | POA: Diagnosis not present

## 2019-02-01 DIAGNOSIS — I480 Paroxysmal atrial fibrillation: Secondary | ICD-10-CM | POA: Diagnosis not present

## 2019-02-01 DIAGNOSIS — I639 Cerebral infarction, unspecified: Secondary | ICD-10-CM

## 2019-02-01 DIAGNOSIS — M170 Bilateral primary osteoarthritis of knee: Secondary | ICD-10-CM | POA: Diagnosis not present

## 2019-02-01 DIAGNOSIS — H259 Unspecified age-related cataract: Secondary | ICD-10-CM | POA: Diagnosis not present

## 2019-02-01 DIAGNOSIS — I2510 Atherosclerotic heart disease of native coronary artery without angina pectoris: Secondary | ICD-10-CM | POA: Diagnosis not present

## 2019-02-01 DIAGNOSIS — Z952 Presence of prosthetic heart valve: Secondary | ICD-10-CM

## 2019-02-01 DIAGNOSIS — I083 Combined rheumatic disorders of mitral, aortic and tricuspid valves: Secondary | ICD-10-CM | POA: Diagnosis not present

## 2019-02-01 DIAGNOSIS — N183 Chronic kidney disease, stage 3 unspecified: Secondary | ICD-10-CM | POA: Diagnosis not present

## 2019-02-01 DIAGNOSIS — Z7901 Long term (current) use of anticoagulants: Secondary | ICD-10-CM | POA: Diagnosis not present

## 2019-02-01 DIAGNOSIS — Z794 Long term (current) use of insulin: Secondary | ICD-10-CM | POA: Diagnosis not present

## 2019-02-01 DIAGNOSIS — E1122 Type 2 diabetes mellitus with diabetic chronic kidney disease: Secondary | ICD-10-CM | POA: Diagnosis not present

## 2019-02-03 ENCOUNTER — Telehealth: Payer: Self-pay | Admitting: Cardiovascular Disease

## 2019-02-03 ENCOUNTER — Telehealth: Payer: Self-pay | Admitting: Physician Assistant

## 2019-02-03 MED ORDER — IRON (FERROUS SULFATE) 325 (65 FE) MG PO TABS: 325.0000 mg | ORAL_TABLET | Freq: Two times a day (BID) | ORAL | 0 refills | Status: DC

## 2019-02-03 NOTE — Telephone Encounter (Signed)
   The patient and her daughter called the answering service today.  Chart reviewed - recent TAVR, ABL anemia, TIA. She required blood transfusion. Her daughter sent in [place] messages on 11/6 and 11/8 available for review. Her daughter says ever since surgery the patient has just felt generally fatigued. No acute cardiac symptoms. They do not know if this is just the effect of the surgery, anemia, etc or if this is to be expected. Given her blood loss they recalled someone saying that it may be helpful for her to have an iron supplement so she was calling to inquire about that. The patient denies any acute change from discharge. We discussed that if there was an acute change she would need to seek care to assure that Hgb was remaining stable. They understand that but since symptoms are stable they wish to try a trial of iron supplementation first. This is not unreasonable given her postop anemia. Will start Ferrous Sulfate 325mg  BID disp #60 with zero refills, sent into the requested pharmacy closer to the daughter's house in [place]. I also advised using Colace OTC as directed along with the iron since it can cause some constipation. She reports she had been using Miralax up until stools became more loose so she was going to stop this. They understand she should not use any laxatives or stool softeners if she has further loose stool.   I will forward to TAVR team for further review and management as pt may benefit from f/u Hgb earlier than previously arranged appt on 11/13.  Charlie Pitter, PA-C

## 2019-02-03 NOTE — Telephone Encounter (Signed)
Cardiology Telephone Note  Ms. Hagemeister's daughter called the answering service tonight regarding her mother.  Reports abdominal pain for 2 days, worse today.  Has been nauseous and dry heaving today, though able to keep down meals and water.  Has had temperature of 100.4 and skin looks a little yellow to the daughter.  Blood sugar has been > 200.  Has had runny stools.  Right now resting comfortably with mild symptoms.   I informed her that if patient is unable to eat/drink due to severe nausea/vomiting, or if any of the above symptoms worsen, she should report to the ED tonight.  The daughter decided to keep her at home and monitor her symptoms for now.  She will call her PCPs office in the morning for further evaluation.

## 2019-02-04 ENCOUNTER — Telehealth: Payer: Self-pay | Admitting: Physician Assistant

## 2019-02-04 ENCOUNTER — Telehealth: Payer: Self-pay | Admitting: Cardiovascular Disease

## 2019-02-04 DIAGNOSIS — I63532 Cerebral infarction due to unspecified occlusion or stenosis of left posterior cerebral artery: Secondary | ICD-10-CM | POA: Diagnosis not present

## 2019-02-04 DIAGNOSIS — R404 Transient alteration of awareness: Secondary | ICD-10-CM | POA: Diagnosis not present

## 2019-02-04 DIAGNOSIS — R410 Disorientation, unspecified: Secondary | ICD-10-CM | POA: Diagnosis not present

## 2019-02-04 DIAGNOSIS — I959 Hypotension, unspecified: Secondary | ICD-10-CM | POA: Diagnosis not present

## 2019-02-04 DIAGNOSIS — I63233 Cerebral infarction due to unspecified occlusion or stenosis of bilateral carotid arteries: Secondary | ICD-10-CM | POA: Diagnosis not present

## 2019-02-04 DIAGNOSIS — I97638 Postprocedural hematoma of a circulatory system organ or structure following other circulatory system procedure: Secondary | ICD-10-CM | POA: Diagnosis not present

## 2019-02-04 DIAGNOSIS — G930 Cerebral cysts: Secondary | ICD-10-CM | POA: Diagnosis not present

## 2019-02-04 DIAGNOSIS — R29898 Other symptoms and signs involving the musculoskeletal system: Secondary | ICD-10-CM | POA: Diagnosis not present

## 2019-02-04 DIAGNOSIS — R1031 Right lower quadrant pain: Secondary | ICD-10-CM | POA: Diagnosis not present

## 2019-02-04 DIAGNOSIS — I480 Paroxysmal atrial fibrillation: Secondary | ICD-10-CM | POA: Diagnosis not present

## 2019-02-04 DIAGNOSIS — R531 Weakness: Secondary | ICD-10-CM | POA: Diagnosis not present

## 2019-02-04 DIAGNOSIS — R112 Nausea with vomiting, unspecified: Secondary | ICD-10-CM | POA: Diagnosis not present

## 2019-02-04 DIAGNOSIS — I052 Rheumatic mitral stenosis with insufficiency: Secondary | ICD-10-CM | POA: Diagnosis not present

## 2019-02-04 DIAGNOSIS — R509 Fever, unspecified: Secondary | ICD-10-CM | POA: Diagnosis not present

## 2019-02-04 DIAGNOSIS — R9089 Other abnormal findings on diagnostic imaging of central nervous system: Secondary | ICD-10-CM | POA: Diagnosis not present

## 2019-02-04 DIAGNOSIS — I10 Essential (primary) hypertension: Secondary | ICD-10-CM | POA: Diagnosis not present

## 2019-02-04 DIAGNOSIS — R609 Edema, unspecified: Secondary | ICD-10-CM | POA: Diagnosis not present

## 2019-02-04 DIAGNOSIS — N179 Acute kidney failure, unspecified: Secondary | ICD-10-CM | POA: Diagnosis not present

## 2019-02-04 DIAGNOSIS — D62 Acute posthemorrhagic anemia: Secondary | ICD-10-CM | POA: Diagnosis not present

## 2019-02-04 DIAGNOSIS — R4781 Slurred speech: Secondary | ICD-10-CM | POA: Diagnosis not present

## 2019-02-04 DIAGNOSIS — I2510 Atherosclerotic heart disease of native coronary artery without angina pectoris: Secondary | ICD-10-CM | POA: Diagnosis not present

## 2019-02-04 DIAGNOSIS — I350 Nonrheumatic aortic (valve) stenosis: Secondary | ICD-10-CM | POA: Diagnosis not present

## 2019-02-04 DIAGNOSIS — I6389 Other cerebral infarction: Secondary | ICD-10-CM | POA: Diagnosis not present

## 2019-02-04 DIAGNOSIS — S300XXA Contusion of lower back and pelvis, initial encounter: Secondary | ICD-10-CM | POA: Diagnosis not present

## 2019-02-04 DIAGNOSIS — T148XXA Other injury of unspecified body region, initial encounter: Secondary | ICD-10-CM | POA: Diagnosis not present

## 2019-02-04 DIAGNOSIS — Z9889 Other specified postprocedural states: Secondary | ICD-10-CM | POA: Diagnosis not present

## 2019-02-04 DIAGNOSIS — Z885 Allergy status to narcotic agent status: Secondary | ICD-10-CM | POA: Diagnosis not present

## 2019-02-04 DIAGNOSIS — Z953 Presence of xenogenic heart valve: Secondary | ICD-10-CM | POA: Diagnosis not present

## 2019-02-04 DIAGNOSIS — Z7901 Long term (current) use of anticoagulants: Secondary | ICD-10-CM | POA: Diagnosis not present

## 2019-02-04 DIAGNOSIS — S301XXA Contusion of abdominal wall, initial encounter: Secondary | ICD-10-CM | POA: Diagnosis not present

## 2019-02-04 DIAGNOSIS — I63429 Cerebral infarction due to embolism of unspecified anterior cerebral artery: Secondary | ICD-10-CM | POA: Diagnosis not present

## 2019-02-04 DIAGNOSIS — E871 Hypo-osmolality and hyponatremia: Secondary | ICD-10-CM | POA: Diagnosis not present

## 2019-02-04 DIAGNOSIS — I63512 Cerebral infarction due to unspecified occlusion or stenosis of left middle cerebral artery: Secondary | ICD-10-CM | POA: Diagnosis not present

## 2019-02-04 DIAGNOSIS — R1010 Upper abdominal pain, unspecified: Secondary | ICD-10-CM | POA: Diagnosis not present

## 2019-02-04 DIAGNOSIS — R7989 Other specified abnormal findings of blood chemistry: Secondary | ICD-10-CM | POA: Diagnosis not present

## 2019-02-04 DIAGNOSIS — I6359 Cerebral infarction due to unspecified occlusion or stenosis of other cerebral artery: Secondary | ICD-10-CM | POA: Diagnosis not present

## 2019-02-04 DIAGNOSIS — I517 Cardiomegaly: Secondary | ICD-10-CM | POA: Diagnosis not present

## 2019-02-04 DIAGNOSIS — Z743 Need for continuous supervision: Secondary | ICD-10-CM | POA: Diagnosis not present

## 2019-02-04 DIAGNOSIS — R29700 NIHSS score 0: Secondary | ICD-10-CM | POA: Diagnosis not present

## 2019-02-04 DIAGNOSIS — E119 Type 2 diabetes mellitus without complications: Secondary | ICD-10-CM | POA: Diagnosis not present

## 2019-02-04 NOTE — Telephone Encounter (Signed)
I spoke to the patient's daughter [name]) who informed me that they are going to the patient's PCP for f/u today about dark urine, kidney function appears normal.  She will also f/u with Dr Angelena Form 11/13.

## 2019-02-04 NOTE — Telephone Encounter (Signed)
  [facility]   MULTIDISCIPLINARY HEART VALVE TEAM  Patient's daughter called after MyChart message request. Per daughter, she is having jaundice, severe nausea/vomitting, dark urine, weakness. She will be seeing her PCP today at 2pm who is planning on doing labs. Her symptoms sound more GI than cardiac, but I asked them to also get a blood count given recent groin hematoma and need for transfusion x 2. [name], will call me back with updates.   Angelena Form PA-C  [facility]

## 2019-02-04 NOTE — Telephone Encounter (Signed)
Thanks

## 2019-02-04 NOTE — Telephone Encounter (Signed)
New Message     Pts daughter is calling, she says she is having dark urine and she has been pushing fluids. She is concerned about her kidneys and is wanting to know if she needs to follow up with the [facility] or her heart Dr.     Please call back

## 2019-02-04 NOTE — Telephone Encounter (Signed)
Thanks!    Chris.

## 2019-02-05 MED ORDER — INSULIN LISPRO 100 UNIT/ML ~~LOC~~ SOLN
1.00 | SUBCUTANEOUS | Status: DC
Start: ? — End: 2019-02-05

## 2019-02-05 MED ORDER — NITROGLYCERIN 0.4 MG SL SUBL
0.40 | SUBLINGUAL_TABLET | SUBLINGUAL | Status: DC
Start: ? — End: 2019-02-05

## 2019-02-05 MED ORDER — ATORVASTATIN CALCIUM 40 MG PO TABS
40.00 | ORAL_TABLET | ORAL | Status: DC
Start: 2019-02-07 — End: 2019-02-05

## 2019-02-05 MED ORDER — VENLAFAXINE HCL ER 75 MG PO CP24
75.00 | ORAL_CAPSULE | ORAL | Status: DC
Start: 2019-02-06 — End: 2019-02-05

## 2019-02-05 MED ORDER — ASPIRIN 81 MG PO CHEW
81.00 | CHEWABLE_TABLET | ORAL | Status: DC
Start: 2019-02-08 — End: 2019-02-05

## 2019-02-05 MED ORDER — THERA PO TABS
1.00 | ORAL_TABLET | ORAL | Status: DC
Start: 2019-02-08 — End: 2019-02-05

## 2019-02-05 MED ORDER — INSULIN GLARGINE 100 UNIT/ML ~~LOC~~ SOLN
1.00 | SUBCUTANEOUS | Status: DC
Start: ? — End: 2019-02-05

## 2019-02-05 MED ORDER — GENERIC EXTERNAL MEDICATION
Status: DC
Start: ? — End: 2019-02-05

## 2019-02-05 MED ORDER — INSULIN GLARGINE 100 UNIT/ML ~~LOC~~ SOLN
1.00 | SUBCUTANEOUS | Status: DC
Start: 2019-02-07 — End: 2019-02-05

## 2019-02-05 MED ORDER — LABETALOL HCL 5 MG/ML IV SOLN
10.00 | INTRAVENOUS | Status: DC
Start: ? — End: 2019-02-05

## 2019-02-05 MED ORDER — ALBUTEROL SULFATE (2.5 MG/3ML) 0.083% IN NEBU
2.50 | INHALATION_SOLUTION | RESPIRATORY_TRACT | Status: DC
Start: ? — End: 2019-02-05

## 2019-02-05 MED ORDER — SODIUM CHLORIDE 0.9 % IV SOLN
10.00 | INTRAVENOUS | Status: DC
Start: ? — End: 2019-02-05

## 2019-02-05 MED ORDER — ACETAMINOPHEN 325 MG PO TABS
650.00 | ORAL_TABLET | ORAL | Status: DC
Start: ? — End: 2019-02-05

## 2019-02-05 MED ORDER — BUSPIRONE HCL 5 MG PO TABS
10.00 | ORAL_TABLET | ORAL | Status: DC
Start: 2019-02-07 — End: 2019-02-05

## 2019-02-05 MED ORDER — FERROUS SULFATE 325 (65 FE) MG PO TABS
325.00 | ORAL_TABLET | ORAL | Status: DC
Start: 2019-02-07 — End: 2019-02-05

## 2019-02-05 MED ORDER — RIVAROXABAN 15 MG PO TABS
15.00 | ORAL_TABLET | ORAL | Status: DC
Start: 2019-02-08 — End: 2019-02-05

## 2019-02-05 MED ORDER — ACETAMINOPHEN 650 MG RE SUPP
650.00 | RECTAL | Status: DC
Start: ? — End: 2019-02-05

## 2019-02-05 MED ORDER — ALUM & MAG HYDROXIDE-SIMETH 200-200-20 MG/5ML PO SUSP
30.00 | ORAL | Status: DC
Start: ? — End: 2019-02-05

## 2019-02-05 MED ORDER — INSULIN LISPRO 100 UNIT/ML ~~LOC~~ SOLN
1.00 | SUBCUTANEOUS | Status: DC
Start: 2019-02-07 — End: 2019-02-05

## 2019-02-08 ENCOUNTER — Ambulatory Visit: Payer: Medicare Other | Admitting: Cardiovascular Disease

## 2019-02-08 ENCOUNTER — Other Ambulatory Visit (HOSPITAL_COMMUNITY): Payer: Medicare Other

## 2019-02-08 DIAGNOSIS — I083 Combined rheumatic disorders of mitral, aortic and tricuspid valves: Secondary | ICD-10-CM | POA: Diagnosis not present

## 2019-02-08 DIAGNOSIS — Z7901 Long term (current) use of anticoagulants: Secondary | ICD-10-CM | POA: Diagnosis not present

## 2019-02-08 DIAGNOSIS — Z794 Long term (current) use of insulin: Secondary | ICD-10-CM | POA: Diagnosis not present

## 2019-02-08 DIAGNOSIS — E1122 Type 2 diabetes mellitus with diabetic chronic kidney disease: Secondary | ICD-10-CM | POA: Diagnosis not present

## 2019-02-08 DIAGNOSIS — Z952 Presence of prosthetic heart valve: Secondary | ICD-10-CM | POA: Diagnosis not present

## 2019-02-08 DIAGNOSIS — D500 Iron deficiency anemia secondary to blood loss (chronic): Secondary | ICD-10-CM | POA: Diagnosis not present

## 2019-02-08 DIAGNOSIS — I129 Hypertensive chronic kidney disease with stage 1 through stage 4 chronic kidney disease, or unspecified chronic kidney disease: Secondary | ICD-10-CM | POA: Diagnosis not present

## 2019-02-08 DIAGNOSIS — I2510 Atherosclerotic heart disease of native coronary artery without angina pectoris: Secondary | ICD-10-CM | POA: Diagnosis not present

## 2019-02-08 DIAGNOSIS — M170 Bilateral primary osteoarthritis of knee: Secondary | ICD-10-CM | POA: Diagnosis not present

## 2019-02-08 DIAGNOSIS — N183 Chronic kidney disease, stage 3 unspecified: Secondary | ICD-10-CM | POA: Diagnosis not present

## 2019-02-08 DIAGNOSIS — I480 Paroxysmal atrial fibrillation: Secondary | ICD-10-CM | POA: Diagnosis not present

## 2019-02-08 DIAGNOSIS — H259 Unspecified age-related cataract: Secondary | ICD-10-CM | POA: Diagnosis not present

## 2019-02-08 DIAGNOSIS — I97638 Postprocedural hematoma of a circulatory system organ or structure following other circulatory system procedure: Secondary | ICD-10-CM | POA: Diagnosis not present

## 2019-02-08 MED ORDER — GENERIC EXTERNAL MEDICATION
Status: DC
Start: ? — End: 2019-02-08

## 2019-02-08 MED ORDER — VENLAFAXINE HCL ER 75 MG PO CP24
225.00 | ORAL_CAPSULE | ORAL | Status: DC
Start: 2019-02-08 — End: 2019-02-08

## 2019-02-08 MED ORDER — SODIUM CHLORIDE 0.9 % IV SOLN
10.00 | INTRAVENOUS | Status: DC
Start: ? — End: 2019-02-08

## 2019-02-12 DIAGNOSIS — I6340 Cerebral infarction due to embolism of unspecified cerebral artery: Secondary | ICD-10-CM | POA: Diagnosis not present

## 2019-02-12 DIAGNOSIS — I480 Paroxysmal atrial fibrillation: Secondary | ICD-10-CM | POA: Diagnosis not present

## 2019-02-12 DIAGNOSIS — E1165 Type 2 diabetes mellitus with hyperglycemia: Secondary | ICD-10-CM | POA: Diagnosis not present

## 2019-02-12 DIAGNOSIS — K5901 Slow transit constipation: Secondary | ICD-10-CM | POA: Diagnosis not present

## 2019-02-12 DIAGNOSIS — I350 Nonrheumatic aortic (valve) stenosis: Secondary | ICD-10-CM | POA: Diagnosis not present

## 2019-02-13 ENCOUNTER — Other Ambulatory Visit: Payer: Self-pay

## 2019-02-13 ENCOUNTER — Other Ambulatory Visit: Payer: Self-pay | Admitting: *Deleted

## 2019-02-13 DIAGNOSIS — I129 Hypertensive chronic kidney disease with stage 1 through stage 4 chronic kidney disease, or unspecified chronic kidney disease: Secondary | ICD-10-CM | POA: Diagnosis not present

## 2019-02-13 DIAGNOSIS — I97638 Postprocedural hematoma of a circulatory system organ or structure following other circulatory system procedure: Secondary | ICD-10-CM | POA: Diagnosis not present

## 2019-02-13 DIAGNOSIS — N183 Chronic kidney disease, stage 3 unspecified: Secondary | ICD-10-CM | POA: Diagnosis not present

## 2019-02-13 DIAGNOSIS — Z952 Presence of prosthetic heart valve: Secondary | ICD-10-CM | POA: Diagnosis not present

## 2019-02-13 DIAGNOSIS — Z7901 Long term (current) use of anticoagulants: Secondary | ICD-10-CM | POA: Diagnosis not present

## 2019-02-13 DIAGNOSIS — M170 Bilateral primary osteoarthritis of knee: Secondary | ICD-10-CM | POA: Diagnosis not present

## 2019-02-13 DIAGNOSIS — D500 Iron deficiency anemia secondary to blood loss (chronic): Secondary | ICD-10-CM | POA: Diagnosis not present

## 2019-02-13 DIAGNOSIS — I480 Paroxysmal atrial fibrillation: Secondary | ICD-10-CM | POA: Diagnosis not present

## 2019-02-13 DIAGNOSIS — I083 Combined rheumatic disorders of mitral, aortic and tricuspid valves: Secondary | ICD-10-CM | POA: Diagnosis not present

## 2019-02-13 DIAGNOSIS — Z794 Long term (current) use of insulin: Secondary | ICD-10-CM | POA: Diagnosis not present

## 2019-02-13 DIAGNOSIS — E1122 Type 2 diabetes mellitus with diabetic chronic kidney disease: Secondary | ICD-10-CM | POA: Diagnosis not present

## 2019-02-13 DIAGNOSIS — H259 Unspecified age-related cataract: Secondary | ICD-10-CM | POA: Diagnosis not present

## 2019-02-13 DIAGNOSIS — I2510 Atherosclerotic heart disease of native coronary artery without angina pectoris: Secondary | ICD-10-CM | POA: Diagnosis not present

## 2019-02-13 NOTE — Patient Outreach (Signed)
Rachel Santos) Care Management  02/13/2019  Rachel Santos 1940-06-13 [id number]   Subjective: Telephone call to patient's home / mobile number, spoke with patient, and HIPAA verified.  Discussed [place] Medicare EMMI Stroke Red Alert follow up, patient voiced understanding, and is in agreement to follow up.  Patient states she is feeling much better, she remembers receiving the EMMI automated calls, has attended a follow up appointment  with Stroke Bridge program/ neurologist on 02/12/2019 through [place].    Patient gave verbal consent to this RNCM to speak with patient's daughter Rachel Santos) Rachel Santos regarding her healthcare needs as needed.  Patient placed call on speaker phone, spoke with patient and daughter Rachel Santos) for the remain of the assessment. Daughter states patient has a follow up appointment with cardiologist in the near future, date unknown, appointment made by her sister, has an appointment with primary MD on 02/15/2019, and with neurologist on 02/20/2019.  Patient states she is able to manage self care and has assistance as needed.  Patient is aware reason for hospitalizations and treatment plan.  Patient states she is aware of signs/ symptoms to report, how to reach provider if needed after hours, when to go to ED, and / or call 911.   States she is accessing her [employer] benefits as needed via member services number on back of card. Patient states she does not have any education material, EMMI follow up, care coordination, care management, disease monitoring, transportation, community resource, or pharmacy needs at this time.  States she is very appreciative of the follow up,  is in agreement to receive [place] Management information, and to receive EMMI follow up calls as needed.    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 Patient hospitalized 01/22/2019 - 01/28/2019 for Severe aortic stenosis, status post  TAVR (transcatheter aortic valve replacement), Acute blood loss anemia, transfused 2 units PRBC total this admit.   Patient also has a history of diabetes, Paroxysmal atrial fibrillation, hypertension, CVA,  mitral valve repair, tricuspid valve repair, Groin hematoma with urgent evacuation 01/23/19, and mitral/tricuspid valve ring.      Assessment: Received [employer] EMMI Stroke Google Alert referral follow up on 02/12/2019. Red Flag Alert Trigger, Day #13, patient answered no to the following question: Went to follow-up appointment?  EMMI follow up completed and no further care management needs.      Plan: RNCM will send patient successful outreach letter, [facility] pamphlet, and magnet. RNCM will complete case closure due to follow up completed / no care management needs.      Rachel Santos H. Annia Friendly, BSN, [place] [facility] Telephonic CM Phone: [phone] Fax: [phone]

## 2019-02-13 NOTE — Patient Outreach (Signed)
[facility]) Care Management  02/13/2019  Dezlyn Abernathy [place] 05-09-1940 [id number]   Medication Adherence call to Mrs. Kritika Bracknell [place] Identifiers Verify spoke with patients daughter she explain doctor has stop Lisinopril 40 mg at this time,daughter explain she does not know if doctor will put her back on it,but at this time she is not taking it.Mrs. Kosters is showing past due under [place].   [place] Management Direct Dial [phone]  Fax [phone] [email or web address]

## 2019-02-15 DIAGNOSIS — R269 Unspecified abnormalities of gait and mobility: Secondary | ICD-10-CM | POA: Diagnosis not present

## 2019-02-15 DIAGNOSIS — E782 Mixed hyperlipidemia: Secondary | ICD-10-CM | POA: Diagnosis not present

## 2019-02-15 DIAGNOSIS — I10 Essential (primary) hypertension: Secondary | ICD-10-CM | POA: Diagnosis not present

## 2019-02-15 DIAGNOSIS — D649 Anemia, unspecified: Secondary | ICD-10-CM | POA: Diagnosis not present

## 2019-02-15 DIAGNOSIS — E1065 Type 1 diabetes mellitus with hyperglycemia: Secondary | ICD-10-CM | POA: Diagnosis not present

## 2019-02-19 DIAGNOSIS — H259 Unspecified age-related cataract: Secondary | ICD-10-CM | POA: Diagnosis not present

## 2019-02-19 DIAGNOSIS — E1122 Type 2 diabetes mellitus with diabetic chronic kidney disease: Secondary | ICD-10-CM | POA: Diagnosis not present

## 2019-02-19 DIAGNOSIS — I2510 Atherosclerotic heart disease of native coronary artery without angina pectoris: Secondary | ICD-10-CM | POA: Diagnosis not present

## 2019-02-19 DIAGNOSIS — I480 Paroxysmal atrial fibrillation: Secondary | ICD-10-CM | POA: Diagnosis not present

## 2019-02-19 DIAGNOSIS — M170 Bilateral primary osteoarthritis of knee: Secondary | ICD-10-CM | POA: Diagnosis not present

## 2019-02-19 DIAGNOSIS — I083 Combined rheumatic disorders of mitral, aortic and tricuspid valves: Secondary | ICD-10-CM | POA: Diagnosis not present

## 2019-02-19 DIAGNOSIS — D500 Iron deficiency anemia secondary to blood loss (chronic): Secondary | ICD-10-CM | POA: Diagnosis not present

## 2019-02-19 DIAGNOSIS — I97638 Postprocedural hematoma of a circulatory system organ or structure following other circulatory system procedure: Secondary | ICD-10-CM | POA: Diagnosis not present

## 2019-02-19 DIAGNOSIS — Z952 Presence of prosthetic heart valve: Secondary | ICD-10-CM | POA: Diagnosis not present

## 2019-02-19 DIAGNOSIS — Z7901 Long term (current) use of anticoagulants: Secondary | ICD-10-CM | POA: Diagnosis not present

## 2019-02-19 DIAGNOSIS — I129 Hypertensive chronic kidney disease with stage 1 through stage 4 chronic kidney disease, or unspecified chronic kidney disease: Secondary | ICD-10-CM | POA: Diagnosis not present

## 2019-02-19 DIAGNOSIS — Z794 Long term (current) use of insulin: Secondary | ICD-10-CM | POA: Diagnosis not present

## 2019-02-19 DIAGNOSIS — N183 Chronic kidney disease, stage 3 unspecified: Secondary | ICD-10-CM | POA: Diagnosis not present

## 2019-02-20 ENCOUNTER — Other Ambulatory Visit: Payer: Self-pay

## 2019-02-20 ENCOUNTER — Encounter: Payer: Self-pay | Admitting: Vascular Surgery

## 2019-02-20 ENCOUNTER — Ambulatory Visit (INDEPENDENT_AMBULATORY_CARE_PROVIDER_SITE_OTHER): Payer: Self-pay | Admitting: Vascular Surgery

## 2019-02-20 VITALS — BP 113/62 | HR 82 | Temp 97.8°F | Resp 14 | Ht 66.0 in | Wt 148.4 lb

## 2019-02-20 DIAGNOSIS — Z48812 Encounter for surgical aftercare following surgery on the circulatory system: Secondary | ICD-10-CM

## 2019-02-20 DIAGNOSIS — I4891 Unspecified atrial fibrillation: Secondary | ICD-10-CM | POA: Diagnosis not present

## 2019-02-20 DIAGNOSIS — E1121 Type 2 diabetes mellitus with diabetic nephropathy: Secondary | ICD-10-CM | POA: Diagnosis not present

## 2019-02-20 DIAGNOSIS — G459 Transient cerebral ischemic attack, unspecified: Secondary | ICD-10-CM | POA: Diagnosis not present

## 2019-02-20 DIAGNOSIS — Z7901 Long term (current) use of anticoagulants: Secondary | ICD-10-CM | POA: Diagnosis not present

## 2019-02-20 NOTE — Progress Notes (Signed)
Patient name: Rachel Santos MRN: [id number] DOB: September 21, 1940 Sex: female  REASON FOR VISIT:   Follow-up after repair of right common femoral artery.  HPI:   Rachel Santos is a pleasant 78 y.o. female who developed an expanding hematoma in the right groin after a TAVR.  She was taken urgently to the operating room for exploration and underwent repair of the right common femoral artery and evacuation of hematoma.  Intraoperatively I found that the Perclose was right at the level of the inguinal ligament and was partially caught on the inguinal ligament.  The common femoral artery was repaired transversely.  She comes in for a follow-up visit.  She has no specific complaints.  She has been gradually resuming her normal activities.  Current Outpatient Medications  Medication Sig Dispense Refill  . acetaminophen (TYLENOL) 500 MG tablet Take 1,000 mg by mouth every 6 (six) hours as needed for moderate pain or headache.    Marland Kitchen aspirin EC 81 MG tablet Take 81 mg by mouth daily.    Marland Kitchen atorvastatin (LIPITOR) 40 MG tablet Take 40 mg by mouth daily.    . busPIRone (BUSPAR) 10 MG tablet Take 10 mg by mouth 2 (two) times daily.     . Calcium Carb-Cholecalciferol (CALCIUM PLUS VITAMIN D3) 600-500 MG-UNIT CAPS Take 1 tablet by mouth 2 (two) times daily.    . Glycerin-Hypromellose-PEG 400 (DRY EYE RELIEF DROPS OP) Place 1 drop into both eyes daily as needed (dry eyes).    . Insulin Detemir (LEVEMIR FLEXPEN) 100 UNIT/ML Pen Inject 32 Units into the skin daily.     . Iron, Ferrous Sulfate, 325 (65 Fe) MG TABS Take 325 mg by mouth 2 (two) times daily. 60 tablet 0  . Melatonin 5 MG CAPS Take 5 mg by mouth at bedtime as needed (sleep).    . Multiple Vitamin (MULTIVITAMIN WITH MINERALS) TABS tablet Take 1 tablet by mouth daily.    Marland Kitchen omeprazole (PRILOSEC) 40 MG capsule Take 40 mg by mouth daily before breakfast.     . polyethylene glycol (MIRALAX / GLYCOLAX) 17 g packet Take 17 g by mouth daily. 14 each 0   . Rivaroxaban (XARELTO) 15 MG TABS tablet Take 15 mg by mouth daily with supper.    . Semaglutide,0.25 or 0.5MG /DOS, (OZEMPIC, 0.25 OR 0.5 MG/DOSE,) 2 MG/1.5ML SOPN Inject 0.5 mg into the skin every Sunday.    . venlafaxine XR (EFFEXOR-XR) 150 MG 24 hr capsule Take 150 mg by mouth daily with breakfast. Take with 75 mg capsule to equal 225 mg daily    . venlafaxine XR (EFFEXOR-XR) 75 MG 24 hr capsule Take 75 mg by mouth daily with breakfast. Take with 150 mg capsule to equal 225 mg daily     No current facility-administered medications for this visit.     REVIEW OF SYSTEMS:  [X]  denotes positive finding, [ ]  denotes negative finding Vascular    Leg swelling    Cardiac    Chest pain or chest pressure:    Shortness of breath upon exertion:    Short of breath when lying flat:    Irregular heart rhythm:    Constitutional    Fever or chills:     PHYSICAL EXAM:   Vitals:   02/20/19 1535  BP: 113/62  Pulse: 82  Resp: 14  Temp: 97.8 F (36.6 C)  TempSrc: Temporal  SpO2: 98%  Weight: 148 lb 6.4 oz (67.3 kg)  Height: 5\' 6"  (1.676 m)  GENERAL: The patient is a well-nourished female, in no acute distress. The vital signs are documented above. CARDIOVASCULAR: There is a regular rate and rhythm. PULMONARY: There is good air exchange bilaterally without wheezing or rales. VASCULAR: She has a palpable femoral pulse below the repair.  Both feet are warm and well-perfused. The swelling in the right groin has improved since she was in the hospital. Her incision looks fine without erythema or drainage.  DATA:   No new data.  MEDICAL ISSUES:   STATUS POST REPAIR RIGHT COMMON FEMORAL ARTERY: The patient is doing well status post emergent repair of the right common femoral artery.  Her swelling continues to improve.  Her incision is healing fine.  I will see her back as needed.  Deitra Mayo [facility] [phone]

## 2019-02-25 ENCOUNTER — Other Ambulatory Visit: Payer: Self-pay | Admitting: Physician Assistant

## 2019-02-26 DIAGNOSIS — Z794 Long term (current) use of insulin: Secondary | ICD-10-CM | POA: Diagnosis not present

## 2019-02-26 DIAGNOSIS — I2510 Atherosclerotic heart disease of native coronary artery without angina pectoris: Secondary | ICD-10-CM | POA: Diagnosis not present

## 2019-02-26 DIAGNOSIS — I083 Combined rheumatic disorders of mitral, aortic and tricuspid valves: Secondary | ICD-10-CM | POA: Diagnosis not present

## 2019-02-26 DIAGNOSIS — Z952 Presence of prosthetic heart valve: Secondary | ICD-10-CM | POA: Diagnosis not present

## 2019-02-26 DIAGNOSIS — H259 Unspecified age-related cataract: Secondary | ICD-10-CM | POA: Diagnosis not present

## 2019-02-26 DIAGNOSIS — I480 Paroxysmal atrial fibrillation: Secondary | ICD-10-CM | POA: Diagnosis not present

## 2019-02-26 DIAGNOSIS — D500 Iron deficiency anemia secondary to blood loss (chronic): Secondary | ICD-10-CM | POA: Diagnosis not present

## 2019-02-26 DIAGNOSIS — E1122 Type 2 diabetes mellitus with diabetic chronic kidney disease: Secondary | ICD-10-CM | POA: Diagnosis not present

## 2019-02-26 DIAGNOSIS — I129 Hypertensive chronic kidney disease with stage 1 through stage 4 chronic kidney disease, or unspecified chronic kidney disease: Secondary | ICD-10-CM | POA: Diagnosis not present

## 2019-02-26 DIAGNOSIS — Z7901 Long term (current) use of anticoagulants: Secondary | ICD-10-CM | POA: Diagnosis not present

## 2019-02-26 DIAGNOSIS — M170 Bilateral primary osteoarthritis of knee: Secondary | ICD-10-CM | POA: Diagnosis not present

## 2019-02-26 DIAGNOSIS — N183 Chronic kidney disease, stage 3 unspecified: Secondary | ICD-10-CM | POA: Diagnosis not present

## 2019-02-26 DIAGNOSIS — I97638 Postprocedural hematoma of a circulatory system organ or structure following other circulatory system procedure: Secondary | ICD-10-CM | POA: Diagnosis not present

## 2019-02-27 DIAGNOSIS — I2510 Atherosclerotic heart disease of native coronary artery without angina pectoris: Secondary | ICD-10-CM | POA: Diagnosis not present

## 2019-02-27 DIAGNOSIS — I129 Hypertensive chronic kidney disease with stage 1 through stage 4 chronic kidney disease, or unspecified chronic kidney disease: Secondary | ICD-10-CM | POA: Diagnosis not present

## 2019-02-27 DIAGNOSIS — I083 Combined rheumatic disorders of mitral, aortic and tricuspid valves: Secondary | ICD-10-CM | POA: Diagnosis not present

## 2019-02-27 DIAGNOSIS — E1122 Type 2 diabetes mellitus with diabetic chronic kidney disease: Secondary | ICD-10-CM | POA: Diagnosis not present

## 2019-02-27 DIAGNOSIS — Z794 Long term (current) use of insulin: Secondary | ICD-10-CM | POA: Diagnosis not present

## 2019-02-27 DIAGNOSIS — M170 Bilateral primary osteoarthritis of knee: Secondary | ICD-10-CM | POA: Diagnosis not present

## 2019-02-27 DIAGNOSIS — Z7901 Long term (current) use of anticoagulants: Secondary | ICD-10-CM | POA: Diagnosis not present

## 2019-02-27 DIAGNOSIS — N183 Chronic kidney disease, stage 3 unspecified: Secondary | ICD-10-CM | POA: Diagnosis not present

## 2019-02-27 DIAGNOSIS — H259 Unspecified age-related cataract: Secondary | ICD-10-CM | POA: Diagnosis not present

## 2019-02-27 DIAGNOSIS — D500 Iron deficiency anemia secondary to blood loss (chronic): Secondary | ICD-10-CM | POA: Diagnosis not present

## 2019-02-27 DIAGNOSIS — I480 Paroxysmal atrial fibrillation: Secondary | ICD-10-CM | POA: Diagnosis not present

## 2019-02-27 DIAGNOSIS — Z952 Presence of prosthetic heart valve: Secondary | ICD-10-CM | POA: Diagnosis not present

## 2019-02-27 DIAGNOSIS — I97638 Postprocedural hematoma of a circulatory system organ or structure following other circulatory system procedure: Secondary | ICD-10-CM | POA: Diagnosis not present

## 2019-02-28 ENCOUNTER — Ambulatory Visit: Payer: Medicare Other | Admitting: Physician Assistant

## 2019-02-28 ENCOUNTER — Other Ambulatory Visit: Payer: Self-pay

## 2019-02-28 ENCOUNTER — Ambulatory Visit (HOSPITAL_COMMUNITY): Payer: Medicare Other | Attending: Cardiovascular Disease

## 2019-02-28 ENCOUNTER — Encounter: Payer: Self-pay | Admitting: Physician Assistant

## 2019-02-28 VITALS — BP 130/68 | HR 77 | Ht 66.0 in | Wt 146.8 lb

## 2019-02-28 DIAGNOSIS — I480 Paroxysmal atrial fibrillation: Secondary | ICD-10-CM | POA: Diagnosis not present

## 2019-02-28 DIAGNOSIS — I639 Cerebral infarction, unspecified: Secondary | ICD-10-CM | POA: Diagnosis not present

## 2019-02-28 DIAGNOSIS — Z952 Presence of prosthetic heart valve: Secondary | ICD-10-CM | POA: Diagnosis not present

## 2019-02-28 DIAGNOSIS — N839 Noninflammatory disorder of ovary, fallopian tube and broad ligament, unspecified: Secondary | ICD-10-CM

## 2019-02-28 DIAGNOSIS — I10 Essential (primary) hypertension: Secondary | ICD-10-CM

## 2019-02-28 DIAGNOSIS — Z9889 Other specified postprocedural states: Secondary | ICD-10-CM

## 2019-02-28 DIAGNOSIS — S301XXS Contusion of abdominal wall, sequela: Secondary | ICD-10-CM | POA: Diagnosis not present

## 2019-02-28 NOTE — Progress Notes (Signed)
[name]                                       Cardiology Office Note    Date:  03/05/2019   [name], DOB 25-Oct-1940, MRN [id number]  PCP:  Merrilee Seashore, MD  Cardiologist:  Dr. Angelena Form   CC: 1 month s/p TAVR  History of Present Illness:  Rachel Santos is a 78 y.o. 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   She underwent successful TAVR with a 23 mm Edwards Sapien 3 THV via the TF approach on 01/22/19. Post operative echo showed EF 65-70%, normally functioning TAVR with mean gradient of 13 mm Hg and mild PVL. Unfortunately, her TAVR was complicated by an extensive right groin hematoma requiring surgical repair and blood transfusions as well as cardioembolic CVA. MRA showed multifocal acute ischemia scattered within both hemispheres, left-greater-than-right, and in the left cerebellum most consistent with central embolic source. She was restarted on Xarelto.  She was readmitted to [facility] from 11/9-11/12 with groin pain and recurrent aphagia. Her Hg was noted to be down to 7.4 and she was transfused. CT abdomen pelvis showed a right inguinal hematoma and small right pelvic hematoma. CT head showed no acute abnormality but MRI brain showed several small acute cortical-based infarcts in the left frontal, parietal and occipital lobes which may represent an embolic source.  CT angio of the head and neck showed severe narrowing of the left middle cerebral artery and moderate to severe narrowing of the left posterior cerebral artery.  She was evaluated by neurology and underwent full stroke work-up. Her stroke was felt to be most likely related to her intracranial  atherosclerotic disease and a baby aspirin was added to her Xarelto.  Today she presents to clinic for follow-up. Her daughter, Rachel Santos, is here today for her visit. She has been doing much better. She does get some dyspnea on exertion. No chest pain. Groin site feeling better. Still feels a little weak. She still has fatigue. Working with PT. Has had some orthostatic hypotension at home and has been wearing compression stockings, which seems to help. No syncope. No blood in her stool or urine.   Past Medical History:  Diagnosis Date  . Acute blood loss anemia, transfused 1 unit PRBC 01/26/19 01/27/2019  . Anxiety   . Arthritis    knees  . Atrial fibrillation ([place])   . Cataracts, bilateral   . Diabetes mellitus   . GERD (gastroesophageal reflux disease)   . History of TIA (transient ischemic attack)    mini stroke with vision problems  . Hypertension   . Mitral regurgitation   . S/P Maze operation for atrial fibrillation 10/11/2012   Complete bilateral atrial lesion set using bipolar radiofrequency and cryothermy ablation with clipping of LA appendage  . 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   26mm Sorin Memo 3D ring annuloplasty with 26 mm Edwards mc3 tricuspid ring annuloplasty   . 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 Tricuspid  regurgitation     Past Surgical History:  Procedure Laterality Date  . ARTERY REPAIR Right 01/22/2019   Procedure: Right Common Femoral Artery Repair;  Surgeon: Angelia Mould, MD;  Location: [facility];  Service: Vascular;  Laterality: Right;  . CARDIAC CATHETERIZATION  09-05-12  . CARDIOVERSION N/A 07/31/2012   Procedure: CARDIOVERSION;  Surgeon: Josue Hector, MD;  Location: [facility];  Service: Cardiovascular;  Laterality: N/A;  . CESAREAN SECTION     x 2  . COLONOSCOPY WITH PROPOFOL N/A 11/28/2014   Procedure: COLONOSCOPY WITH PROPOFOL;  Surgeon: Carol Ada,  MD;  Location: [facility];  Service: Endoscopy;  Laterality: N/A;  . FEMORAL ARTERY EXPLORATION Right 01/22/2019   Procedure: RIGHT GROIN EXPLORATION with evacuation of hematoma;  Surgeon: Angelia Mould, MD;  Location: [facility];  Service: Vascular;  Laterality: Right;  . FLEXIBLE SIGMOIDOSCOPY N/A 12/18/2015   Procedure: FLEXIBLE SIGMOIDOSCOPY;  Surgeon: Carol Ada, MD;  Location: [facility];  Service: Endoscopy;  Laterality: N/A;  . INTRAOPERATIVE TRANSESOPHAGEAL ECHOCARDIOGRAM N/A 10/11/2012   Procedure: INTRAOPERATIVE TRANSESOPHAGEAL ECHOCARDIOGRAM;  Surgeon: Rexene Alberts, MD;  Location: [facility];  Service: Open Heart Surgery;  Laterality: N/A;  . MASTECTOMY Left 1982  . MAZE N/A 10/11/2012   Procedure: MAZE;  Surgeon: Rexene Alberts, MD;  Location: [place] [facility];  Service: Open Heart Surgery;  Laterality: N/A;  . MITRAL VALVE REPAIR N/A 10/11/2012   Procedure: MITRAL VALVE REPAIR (MVR);  Surgeon: Rexene Alberts, MD;  Location: [facility];  Service: Open Heart Surgery;  Laterality: N/A;  . RIGHT HEART CATH AND CORONARY ANGIOGRAPHY N/A 12/24/2018   Procedure: RIGHT HEART CATH AND CORONARY ANGIOGRAPHY;  Surgeon: [name], MD;  Location: [facility];  Service: Cardiovascular;  Laterality: N/A;  . TEE WITHOUT CARDIOVERSION N/A 07/31/2012   Procedure: TRANSESOPHAGEAL ECHOCARDIOGRAM (TEE);  Surgeon: Josue Hector, MD;  Location: [facility];  Service: Cardiovascular;  Laterality: N/A;  . TEE WITHOUT CARDIOVERSION N/A 01/22/2019   Procedure: TRANSESOPHAGEAL ECHOCARDIOGRAM (TEE);  Surgeon: [name], MD;  Location: [facility];  Service: Open Heart Surgery;  Laterality: N/A;  . TRANSCATHETER AORTIC VALVE REPLACEMENT, TRANSFEMORAL N/A 01/22/2019   Procedure: TRANSCATHETER AORTIC VALVE REPLACEMENT, TRANSFEMORAL;  Surgeon: [name], MD;  Location: [facility];  Service: Open Heart Surgery;  Laterality: N/A;  . TRICUSPID VALVE REPLACEMENT N/A  10/11/2012   Procedure: TRICUSPID VALVE REPAIR;  Surgeon: Rexene Alberts, MD;  Location: [facility];  Service: Open Heart Surgery;  Laterality: N/A;  . TUBAL LIGATION      Current Medications: Outpatient Medications Prior to Visit  Medication Sig Dispense Refill  . acetaminophen (TYLENOL) 500 MG tablet Take 1,000 mg by mouth every 6 (six) hours as needed for moderate pain or headache.    Marland Kitchen aspirin EC 81 MG tablet Take 81 mg by mouth daily.    Marland Kitchen atorvastatin (LIPITOR) 40 MG tablet Take 40 mg by mouth daily.    . busPIRone (BUSPAR) 10 MG tablet Take 10 mg by mouth 2 (two) times daily.     . Calcium Carb-Cholecalciferol (CALCIUM PLUS VITAMIN D3) 600-500 MG-UNIT CAPS Take 1 tablet by mouth 2 (two) times daily.    . Cyanocobalamin (VITAMIN B-12 PO) Take 1 tablet by mouth daily.    . ferrous sulfate 325 (65 FE) MG tablet Take 325 mg by mouth daily.    . Glycerin-Hypromellose-PEG 400 (DRY EYE RELIEF DROPS OP) Place 1 drop into both eyes daily as needed (  dry eyes).    . Insulin Detemir (LEVEMIR FLEXPEN) 100 UNIT/ML Pen Inject 32 Units into the skin daily.     . Melatonin 5 MG CAPS Take 5 mg by mouth at bedtime as needed (sleep).    . Multiple Vitamin (MULTIVITAMIN WITH MINERALS) TABS tablet Take 1 tablet by mouth daily.    Marland Kitchen omeprazole (PRILOSEC) 40 MG capsule Take 40 mg by mouth daily before breakfast.     . Rivaroxaban (XARELTO) 15 MG TABS tablet Take 15 mg by mouth daily with supper.    . Semaglutide,0.25 or 0.5MG /DOS, (OZEMPIC, 0.25 OR 0.5 MG/DOSE,) 2 MG/1.5ML SOPN Inject 0.5 mg into the skin every Monday.     . sennosides-docusate sodium (SENOKOT-S) 8.6-50 MG tablet Take 1 tablet by mouth daily.    Marland Kitchen venlafaxine XR (EFFEXOR-XR) 150 MG 24 hr capsule Take 150 mg by mouth daily with breakfast. Take with 75 mg capsule to equal 225 mg daily    . venlafaxine XR (EFFEXOR-XR) 75 MG 24 hr capsule Take 75 mg by mouth daily with breakfast. Take with 150 mg capsule to equal 225 mg daily    . ferrous sulfate 325  (65 FE) MG tablet TAKE 325 MG BY MOUTH 2 (TWO) TIMES DAILY. 60 tablet 9  . polyethylene glycol (MIRALAX / GLYCOLAX) 17 g packet Take 17 g by mouth daily. (Patient not taking: Reported on 02/28/2019) 14 each 0   No facility-administered medications prior to visit.      Allergies:   Metformin and related, Ultram [tramadol], Nickel, and Pneumococcal vaccines   Social History   Socioeconomic History  . Marital status: Widowed    Spouse name: Not on file  . Number of children: 3  . Years of education: Not on file  . Highest education level: Not on file  Occupational History  . Occupation: Air cabin crew  Social Needs  . Financial resource strain: Not on file  . Food insecurity    Worry: Not on file    Inability: Not on file  . Transportation needs    Medical: Not on file    Non-medical: Not on file  Tobacco Use  . Smoking status: Never Smoker  . Smokeless tobacco: Never Used  Substance and Sexual Activity  . Alcohol use: No  . Drug use: No  . Sexual activity: Never  Lifestyle  . Physical activity    Days per week: Not on file    Minutes per session: Not on file  . Stress: Not on file  Relationships  . Social Herbalist on phone: Not on file    Gets together: Not on file    Attends religious service: Not on file    Active member of club or organization: Not on file    Attends meetings of clubs or organizations: Not on file    Relationship status: Not on file  Other Topics Concern  . Not on file  Social History Narrative  . Not on file     Family History:  The patient's family history includes Cancer in her father; Heart attack in her paternal grandfather; Hypertension in her father.     ROS:   Please see the history of present illness.    ROS All other systems reviewed and are negative.   PHYSICAL EXAM:   VS:  BP 130/68   Pulse 77   Ht 5\' 6"  (1.676 m)   Wt 146 lb 12.8 oz (66.6 kg)   SpO2 95%   BMI  23.69 kg/m    GEN: Well  nourished, well developed, in no acute distress HEENT: normal Neck: no JVD or masses Cardiac: RRR; no murmurs, rubs, or gallops,no edema  Respiratory:  clear to auscultation bilaterally, normal work of breathing GI: soft, nontender, nondistended, + BS MS: no deformity or atrophy Skin: warm and dry, no rash. Right groin with mild discoloration but no hematoma or ecchymosis Neuro:  Alert and Oriented x 3, Strength and sensation are intact Psych: euthymic mood, full affect   Wt Readings from Last 3 Encounters:  02/28/19 146 lb 12.8 oz (66.6 kg)  02/20/19 148 lb 6.4 oz (67.3 kg)  01/28/19 151 lb 11.2 oz (68.8 kg)      Studies/Labs Reviewed:   EKG:  EKG is NOT ordered today.    Recent Labs: 01/18/2019: ALT 21; B Natriuretic Peptide 67.9 01/23/2019: Magnesium 1.7 01/28/2019: BUN 16; Creatinine, Ser 0.75; Hemoglobin 9.0; Platelets 143; Potassium 4.1; Sodium 132   Lipid Panel    Component Value Date/Time   CHOL 112 01/23/2019 1530   TRIG 149 01/23/2019 1530   HDL 38 (L) 01/23/2019 1530   CHOLHDL 2.9 01/23/2019 1530   VLDL 30 01/23/2019 1530   LDLCALC 44 01/23/2019 1530    Additional studies/ records that were reviewed today include:  TAVR OPERATIVE NOTE   Date of Procedure:01/22/2019  Preoperative Diagnosis:Severe Aortic Stenosis   Postoperative Diagnosis:Same   Procedure:   Transcatheter Aortic Valve Replacement - PercutaneousRightTransfemoral Approach Edwards Sapien 3 Ultra THV (size 71mm, model # 9750TFX, serial # [id number])  Co-Surgeons:Clarence H. Roxy Manns, MD and Lauree Chandler, MD  Anesthesiologist:Chris Ermalene Postin, MD  Echocardiographer:Mihai Croitoru, MD  Pre-operative Echo Findings: ? Severe aortic stenosis ? Normalleft ventricular systolic function  Post-operative Echo Findings: ? Mildparavalvular leak ? Normalleft ventricular  systolic function   _________________   Echo 01/23/19: IMPRESSIONS 1. Left ventricular ejection fraction, by visual estimation, is 65 to 70%. The left ventricle has normal function. Normal left ventricular size. There is no left ventricular hypertrophy. 2. Left ventricular diastolic Doppler parameters are consistent with impaired relaxation pattern of LV diastolic filling. 3. Global right ventricle has normal systolic function.The right ventricular size is normal. No increase in right ventricular wall thickness. 4. Left atrial size was normal. 5. Right atrial size was normal. 6. Moderate mitral annular calcification. 7. The mitral valve is normal in structure. Mild mitral valve regurgitation. Mild mitral stenosis. 8. The tricuspid valve is normal in structure. Tricuspid valve regurgitation is trivial. 9. Aortic valve regurgitation is mild by color flow Doppler. Mild aortic valve stenosis. 10. Mild perivalvular regurgitation. 11. The pulmonic valve was normal in structure. Pulmonic valve regurgitation is not visualized by color flow Doppler. 12. Normal pulmonary artery systolic pressure. 13. The inferior vena cava is normal in size with greater than 50% respiratory variability, suggesting right atrial pressure of 3 mmHg.  _________________   01/24/19 MRI IMPRESSION: 1. Multifocal acute ischemia scattered within both hemispheres, left-greater-than-right, and in the left cerebellum. The pattern is most consistent with a central embolic source, particularly in a patient with a history of atrial fibrillation. 2. No emergent large vessel occlusion or high-grade stenosis. 3. Mild intracranial atherosclerosis.  _________________  Echo 02/28/19 IMPRESSIONS  1. Left ventricular ejection fraction, by visual estimation, is 65 to 70%. The left ventricle has hyperdynamic function. There is mildly increased left ventricular hypertrophy.  2. Elevated left ventricular  end-diastolic pressure.  3. Left ventricular diastolic parameters are consistent with Grade II diastolic dysfunction (pseudonormalization).  4. Global right ventricle  has normal systolic function.The right ventricular size is normal. No increase in right ventricular wall thickness.  5. Left atrial size was mildly dilated.  6. Right atrial size was normal.  7. Moderate calcification of the mitral valve leaflet(s).  8. Moderate thickening of the mitral valve leaflet(s).  9. Severe mitral annular calcification. 10. The mitral valve is normal in structure. No evidence of mitral valve regurgitation. Mild mitral stenosis. 11. The tricuspid valve is normal in structure. Tricuspid valve regurgitation is mild. 12. Aortic valve regurgitation is not visualized. No evidence of aortic valve sclerosis or stenosis. 13. The pulmonic valve was normal in structure. Pulmonic valve regurgitation is trivial. 14. The inferior vena cava is normal in size with greater than 50% respiratory variability, suggesting right atrial pressure of 3 mmHg. 15. 23 mm Edwards SAPIEN 3 Ultra valve has peak/mean gradients 21/13 mmHg, stable mild paravalvular leak. AVA 1.4 cm2.   ASSESSMENT & PLAN:   Severe AS s/p TAVR: echo today shows EF 65%, normally functioning TAVR with mean gradient of 13 mm Hg and mild stable PVL. She has NYHA class II symptoms, mostly of fatigue. SBE prophylaxis discussed; she has dentures and does not visit the dentist. She has been on aspirin and Xarelto. Usually, we would stop the aspirin after 6 months after TAVR but given recurrent CVAs and recent finding of severe intracranial stenosis, I will continue long term baby aspirin along with her Xarelto 15mg  daily. Will defer to neuro if and when this can be discontinued.   Right groin hematoma: groin has healed quite well. She was seen by Dr. Scot Dock on 11/25 and felt to be stable.  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 Continue aspirin and Xarelto 15mg  daily. Followed by [facility] neuro. As above, will defer to neuro on when to stop aspirin.   HTN: BP well controlled today. Reviewed log from home where BP is generally under good control with a few high readings. However, I would be reluctant to start an antihypertensive given issues with orthostasis.   PAF: sounds regular on exam today. Continue Xarelto for thromboembolic prophylaxis.   Hx of mitral/tricuspid valve ring w/ MAZE (2014): this has been stable by echocardiography  Incidental adnexal lesion: pre TAVR CT scan showed a right adnexal cystic lesion. Non emergent pelvic US recommended for follow up.  Rachel Santos is going to have her set up with her gynecologist at [facility] to follow this.    Medication Adjustments/Labs and Tests Ordered: Current medicines are reviewed at length with the patient today.  Concerns regarding medicines are outlined above.  Medication changes, Labs and Tests ordered today are listed in the Patient Instructions below. Patient Instructions  Medication Instructions:  Your provider recommends that you continue on your current medications as directed. Please refer to the Current Medication list given to you today.    Follow-Up: You have an appointment with Dr. Angelena Form on Monday, 05/27/19 at 1:40PM.    Signed, Angelena Form, PA-C  03/05/2019 8:25 AM    [facility] [place], [place], [place]  [address] Phone: [phone]; Fax: [phone]

## 2019-02-28 NOTE — Patient Instructions (Addendum)
Medication Instructions:  Your provider recommends that you continue on your current medications as directed. Please refer to the Current Medication list given to you today.    Follow-Up: You have an appointment with Dr. Angelena Form on Monday, 05/27/19 at 1:40PM.

## 2019-03-01 DIAGNOSIS — I97638 Postprocedural hematoma of a circulatory system organ or structure following other circulatory system procedure: Secondary | ICD-10-CM | POA: Diagnosis not present

## 2019-03-01 DIAGNOSIS — I083 Combined rheumatic disorders of mitral, aortic and tricuspid valves: Secondary | ICD-10-CM | POA: Diagnosis not present

## 2019-03-01 DIAGNOSIS — M170 Bilateral primary osteoarthritis of knee: Secondary | ICD-10-CM | POA: Diagnosis not present

## 2019-03-01 DIAGNOSIS — I129 Hypertensive chronic kidney disease with stage 1 through stage 4 chronic kidney disease, or unspecified chronic kidney disease: Secondary | ICD-10-CM | POA: Diagnosis not present

## 2019-03-01 DIAGNOSIS — N183 Chronic kidney disease, stage 3 unspecified: Secondary | ICD-10-CM | POA: Diagnosis not present

## 2019-03-01 DIAGNOSIS — E1122 Type 2 diabetes mellitus with diabetic chronic kidney disease: Secondary | ICD-10-CM | POA: Diagnosis not present

## 2019-03-01 DIAGNOSIS — D500 Iron deficiency anemia secondary to blood loss (chronic): Secondary | ICD-10-CM | POA: Diagnosis not present

## 2019-03-01 DIAGNOSIS — Z952 Presence of prosthetic heart valve: Secondary | ICD-10-CM | POA: Diagnosis not present

## 2019-03-01 DIAGNOSIS — I2510 Atherosclerotic heart disease of native coronary artery without angina pectoris: Secondary | ICD-10-CM | POA: Diagnosis not present

## 2019-03-01 DIAGNOSIS — H259 Unspecified age-related cataract: Secondary | ICD-10-CM | POA: Diagnosis not present

## 2019-03-01 DIAGNOSIS — I480 Paroxysmal atrial fibrillation: Secondary | ICD-10-CM | POA: Diagnosis not present

## 2019-03-01 DIAGNOSIS — Z794 Long term (current) use of insulin: Secondary | ICD-10-CM | POA: Diagnosis not present

## 2019-03-01 DIAGNOSIS — Z7901 Long term (current) use of anticoagulants: Secondary | ICD-10-CM | POA: Diagnosis not present

## 2019-03-04 ENCOUNTER — Encounter: Payer: Self-pay | Admitting: Adult Health

## 2019-03-04 NOTE — Telephone Encounter (Signed)
I see that both [facility] and [facility] reports in system.

## 2019-03-05 DIAGNOSIS — N183 Chronic kidney disease, stage 3 unspecified: Secondary | ICD-10-CM | POA: Diagnosis not present

## 2019-03-05 DIAGNOSIS — I480 Paroxysmal atrial fibrillation: Secondary | ICD-10-CM | POA: Diagnosis not present

## 2019-03-05 DIAGNOSIS — E1122 Type 2 diabetes mellitus with diabetic chronic kidney disease: Secondary | ICD-10-CM | POA: Diagnosis not present

## 2019-03-05 DIAGNOSIS — Z7901 Long term (current) use of anticoagulants: Secondary | ICD-10-CM | POA: Diagnosis not present

## 2019-03-05 DIAGNOSIS — Z952 Presence of prosthetic heart valve: Secondary | ICD-10-CM | POA: Diagnosis not present

## 2019-03-05 DIAGNOSIS — I2510 Atherosclerotic heart disease of native coronary artery without angina pectoris: Secondary | ICD-10-CM | POA: Diagnosis not present

## 2019-03-05 DIAGNOSIS — D500 Iron deficiency anemia secondary to blood loss (chronic): Secondary | ICD-10-CM | POA: Diagnosis not present

## 2019-03-05 DIAGNOSIS — M170 Bilateral primary osteoarthritis of knee: Secondary | ICD-10-CM | POA: Diagnosis not present

## 2019-03-05 DIAGNOSIS — Z794 Long term (current) use of insulin: Secondary | ICD-10-CM | POA: Diagnosis not present

## 2019-03-05 DIAGNOSIS — I083 Combined rheumatic disorders of mitral, aortic and tricuspid valves: Secondary | ICD-10-CM | POA: Diagnosis not present

## 2019-03-05 DIAGNOSIS — I129 Hypertensive chronic kidney disease with stage 1 through stage 4 chronic kidney disease, or unspecified chronic kidney disease: Secondary | ICD-10-CM | POA: Diagnosis not present

## 2019-03-05 DIAGNOSIS — H259 Unspecified age-related cataract: Secondary | ICD-10-CM | POA: Diagnosis not present

## 2019-03-05 DIAGNOSIS — I97638 Postprocedural hematoma of a circulatory system organ or structure following other circulatory system procedure: Secondary | ICD-10-CM | POA: Diagnosis not present

## 2019-03-06 DIAGNOSIS — Z952 Presence of prosthetic heart valve: Secondary | ICD-10-CM | POA: Diagnosis not present

## 2019-03-06 DIAGNOSIS — H259 Unspecified age-related cataract: Secondary | ICD-10-CM | POA: Diagnosis not present

## 2019-03-06 DIAGNOSIS — I2510 Atherosclerotic heart disease of native coronary artery without angina pectoris: Secondary | ICD-10-CM | POA: Diagnosis not present

## 2019-03-06 DIAGNOSIS — Z794 Long term (current) use of insulin: Secondary | ICD-10-CM | POA: Diagnosis not present

## 2019-03-06 DIAGNOSIS — M170 Bilateral primary osteoarthritis of knee: Secondary | ICD-10-CM | POA: Diagnosis not present

## 2019-03-06 DIAGNOSIS — D500 Iron deficiency anemia secondary to blood loss (chronic): Secondary | ICD-10-CM | POA: Diagnosis not present

## 2019-03-06 DIAGNOSIS — I480 Paroxysmal atrial fibrillation: Secondary | ICD-10-CM | POA: Diagnosis not present

## 2019-03-06 DIAGNOSIS — I129 Hypertensive chronic kidney disease with stage 1 through stage 4 chronic kidney disease, or unspecified chronic kidney disease: Secondary | ICD-10-CM | POA: Diagnosis not present

## 2019-03-06 DIAGNOSIS — Z7901 Long term (current) use of anticoagulants: Secondary | ICD-10-CM | POA: Diagnosis not present

## 2019-03-06 DIAGNOSIS — N183 Chronic kidney disease, stage 3 unspecified: Secondary | ICD-10-CM | POA: Diagnosis not present

## 2019-03-06 DIAGNOSIS — I97638 Postprocedural hematoma of a circulatory system organ or structure following other circulatory system procedure: Secondary | ICD-10-CM | POA: Diagnosis not present

## 2019-03-06 DIAGNOSIS — I083 Combined rheumatic disorders of mitral, aortic and tricuspid valves: Secondary | ICD-10-CM | POA: Diagnosis not present

## 2019-03-06 DIAGNOSIS — E1122 Type 2 diabetes mellitus with diabetic chronic kidney disease: Secondary | ICD-10-CM | POA: Diagnosis not present

## 2019-03-12 DIAGNOSIS — H259 Unspecified age-related cataract: Secondary | ICD-10-CM | POA: Diagnosis not present

## 2019-03-12 DIAGNOSIS — N183 Chronic kidney disease, stage 3 unspecified: Secondary | ICD-10-CM | POA: Diagnosis not present

## 2019-03-12 DIAGNOSIS — E1122 Type 2 diabetes mellitus with diabetic chronic kidney disease: Secondary | ICD-10-CM | POA: Diagnosis not present

## 2019-03-12 DIAGNOSIS — I97638 Postprocedural hematoma of a circulatory system organ or structure following other circulatory system procedure: Secondary | ICD-10-CM | POA: Diagnosis not present

## 2019-03-12 DIAGNOSIS — Z7901 Long term (current) use of anticoagulants: Secondary | ICD-10-CM | POA: Diagnosis not present

## 2019-03-12 DIAGNOSIS — I129 Hypertensive chronic kidney disease with stage 1 through stage 4 chronic kidney disease, or unspecified chronic kidney disease: Secondary | ICD-10-CM | POA: Diagnosis not present

## 2019-03-12 DIAGNOSIS — I2510 Atherosclerotic heart disease of native coronary artery without angina pectoris: Secondary | ICD-10-CM | POA: Diagnosis not present

## 2019-03-12 DIAGNOSIS — D500 Iron deficiency anemia secondary to blood loss (chronic): Secondary | ICD-10-CM | POA: Diagnosis not present

## 2019-03-12 DIAGNOSIS — M170 Bilateral primary osteoarthritis of knee: Secondary | ICD-10-CM | POA: Diagnosis not present

## 2019-03-12 DIAGNOSIS — Z952 Presence of prosthetic heart valve: Secondary | ICD-10-CM | POA: Diagnosis not present

## 2019-03-12 DIAGNOSIS — I480 Paroxysmal atrial fibrillation: Secondary | ICD-10-CM | POA: Diagnosis not present

## 2019-03-12 DIAGNOSIS — Z794 Long term (current) use of insulin: Secondary | ICD-10-CM | POA: Diagnosis not present

## 2019-03-12 DIAGNOSIS — I083 Combined rheumatic disorders of mitral, aortic and tricuspid valves: Secondary | ICD-10-CM | POA: Diagnosis not present

## 2019-03-13 DIAGNOSIS — Z794 Long term (current) use of insulin: Secondary | ICD-10-CM | POA: Diagnosis not present

## 2019-03-13 DIAGNOSIS — M170 Bilateral primary osteoarthritis of knee: Secondary | ICD-10-CM | POA: Diagnosis not present

## 2019-03-13 DIAGNOSIS — N183 Chronic kidney disease, stage 3 unspecified: Secondary | ICD-10-CM | POA: Diagnosis not present

## 2019-03-13 DIAGNOSIS — I083 Combined rheumatic disorders of mitral, aortic and tricuspid valves: Secondary | ICD-10-CM | POA: Diagnosis not present

## 2019-03-13 DIAGNOSIS — I129 Hypertensive chronic kidney disease with stage 1 through stage 4 chronic kidney disease, or unspecified chronic kidney disease: Secondary | ICD-10-CM | POA: Diagnosis not present

## 2019-03-13 DIAGNOSIS — I2510 Atherosclerotic heart disease of native coronary artery without angina pectoris: Secondary | ICD-10-CM | POA: Diagnosis not present

## 2019-03-13 DIAGNOSIS — I480 Paroxysmal atrial fibrillation: Secondary | ICD-10-CM | POA: Diagnosis not present

## 2019-03-13 DIAGNOSIS — Z7901 Long term (current) use of anticoagulants: Secondary | ICD-10-CM | POA: Diagnosis not present

## 2019-03-13 DIAGNOSIS — E1122 Type 2 diabetes mellitus with diabetic chronic kidney disease: Secondary | ICD-10-CM | POA: Diagnosis not present

## 2019-03-13 DIAGNOSIS — I97638 Postprocedural hematoma of a circulatory system organ or structure following other circulatory system procedure: Secondary | ICD-10-CM | POA: Diagnosis not present

## 2019-03-13 DIAGNOSIS — D500 Iron deficiency anemia secondary to blood loss (chronic): Secondary | ICD-10-CM | POA: Diagnosis not present

## 2019-03-13 DIAGNOSIS — Z952 Presence of prosthetic heart valve: Secondary | ICD-10-CM | POA: Diagnosis not present

## 2019-03-13 DIAGNOSIS — H259 Unspecified age-related cataract: Secondary | ICD-10-CM | POA: Diagnosis not present

## 2019-03-19 DIAGNOSIS — Z952 Presence of prosthetic heart valve: Secondary | ICD-10-CM | POA: Diagnosis not present

## 2019-03-19 DIAGNOSIS — I2510 Atherosclerotic heart disease of native coronary artery without angina pectoris: Secondary | ICD-10-CM | POA: Diagnosis not present

## 2019-03-19 DIAGNOSIS — I480 Paroxysmal atrial fibrillation: Secondary | ICD-10-CM | POA: Diagnosis not present

## 2019-03-19 DIAGNOSIS — I129 Hypertensive chronic kidney disease with stage 1 through stage 4 chronic kidney disease, or unspecified chronic kidney disease: Secondary | ICD-10-CM | POA: Diagnosis not present

## 2019-03-19 DIAGNOSIS — D500 Iron deficiency anemia secondary to blood loss (chronic): Secondary | ICD-10-CM | POA: Diagnosis not present

## 2019-03-19 DIAGNOSIS — H259 Unspecified age-related cataract: Secondary | ICD-10-CM | POA: Diagnosis not present

## 2019-03-19 DIAGNOSIS — N183 Chronic kidney disease, stage 3 unspecified: Secondary | ICD-10-CM | POA: Diagnosis not present

## 2019-03-19 DIAGNOSIS — E1122 Type 2 diabetes mellitus with diabetic chronic kidney disease: Secondary | ICD-10-CM | POA: Diagnosis not present

## 2019-03-19 DIAGNOSIS — Z794 Long term (current) use of insulin: Secondary | ICD-10-CM | POA: Diagnosis not present

## 2019-03-19 DIAGNOSIS — Z7901 Long term (current) use of anticoagulants: Secondary | ICD-10-CM | POA: Diagnosis not present

## 2019-03-19 DIAGNOSIS — I97638 Postprocedural hematoma of a circulatory system organ or structure following other circulatory system procedure: Secondary | ICD-10-CM | POA: Diagnosis not present

## 2019-03-19 DIAGNOSIS — I083 Combined rheumatic disorders of mitral, aortic and tricuspid valves: Secondary | ICD-10-CM | POA: Diagnosis not present

## 2019-03-19 DIAGNOSIS — M170 Bilateral primary osteoarthritis of knee: Secondary | ICD-10-CM | POA: Diagnosis not present

## 2019-03-27 ENCOUNTER — Encounter: Payer: Self-pay | Admitting: Adult Health

## 2019-03-27 ENCOUNTER — Other Ambulatory Visit: Payer: Self-pay

## 2019-03-27 ENCOUNTER — Ambulatory Visit: Payer: Medicare Other | Admitting: Adult Health

## 2019-03-27 VITALS — BP 141/73 | HR 72 | Temp 97.6°F | Ht 66.0 in | Wt 148.2 lb

## 2019-03-27 DIAGNOSIS — I480 Paroxysmal atrial fibrillation: Secondary | ICD-10-CM | POA: Diagnosis not present

## 2019-03-27 DIAGNOSIS — E785 Hyperlipidemia, unspecified: Secondary | ICD-10-CM

## 2019-03-27 DIAGNOSIS — R29818 Other symptoms and signs involving the nervous system: Secondary | ICD-10-CM | POA: Diagnosis not present

## 2019-03-27 DIAGNOSIS — E118 Type 2 diabetes mellitus with unspecified complications: Secondary | ICD-10-CM

## 2019-03-27 DIAGNOSIS — I10 Essential (primary) hypertension: Secondary | ICD-10-CM | POA: Diagnosis not present

## 2019-03-27 DIAGNOSIS — R4189 Other symptoms and signs involving cognitive functions and awareness: Secondary | ICD-10-CM

## 2019-03-27 DIAGNOSIS — I6523 Occlusion and stenosis of bilateral carotid arteries: Secondary | ICD-10-CM

## 2019-03-27 DIAGNOSIS — I639 Cerebral infarction, unspecified: Secondary | ICD-10-CM

## 2019-03-27 NOTE — Patient Instructions (Addendum)
Recommend repeat CTA head to assess for continued occlusion/stenosis as found during [facility] admission.  If no occlusion/stenosis found, he will be cleared to discontinue aspirin at this time and continue on Xarelto alone from a neurological standpoint.  If occlusion/stenosis continues to be evident, I will refer you to the vein and vascular surgery for ongoing monitoring -you will be called to schedule imaging  Referral will be placed to [facility] to be evaluated by one of our sleep specialist and possibly undergoing sleep study to look for sleep apnea  Continue to do memory exercises as discussed during speech therapy sessions along with doing memory exercises and compensation strategies.  You may benefit from additional therapy sessions and if interested in the future, please do not hesitate to call office in order will be placed  Continue aspirin 81 mg daily and Xarelto (rivaroxaban) daily  and Lipitor for secondary stroke prevention  Continue to follow up with PCP regarding cholesterol, blood pressure and diabetes management   Continue to monitor blood pressure at home  Maintain strict control of hypertension with blood pressure goal below 130/90, diabetes with hemoglobin A1c goal below 6.5% and cholesterol with LDL cholesterol (bad cholesterol) goal below 70 mg/dL. I also advised the patient to eat a healthy diet with plenty of whole grains, cereals, fruits and vegetables, exercise regularly and maintain ideal body weight.  Followup in the future with me in 2 months or call earlier if needed       Thank you for coming to see [place] at [facility]. I hope we have been able to provide you high quality care today.  You may receive a patient satisfaction survey over the next few weeks. We would appreciate your feedback and comments so that we may continue to improve ourselves and the health of our patients.   Memory Compensation Strategies  1. Use "WARM"  strategy.  W= write it down  A= associate it  R= repeat it  M= make a mental note  2.   You can keep a Social worker.  Use a 3-ring notebook with sections for the following: calendar, important names and phone numbers,  medications, doctors' names/phone numbers, lists/reminders, and a section to journal what you did  each day.   3.    Use a calendar to write appointments down.  4.    Write yourself a schedule for the day.  This can be placed on the calendar or in a separate section of the Memory Notebook.  Keeping a  regular schedule can help memory.  5.    Use medication organizer with sections for each day or morning/evening pills.  You may need help loading it  6.    Keep a basket, or pegboard by the door.  Place items that you need to take out with you in the basket or on the pegboard.  You may also want to  include a message board for reminders.  7.    Use sticky notes.  Place sticky notes with reminders in a place where the task is performed.  For example: " turn off the  stove" placed by the stove, "lock the door" placed on the door at eye level, " take your medications" on  the bathroom mirror or by the place where you normally take your medications.  8.    Use alarms/timers.  Use while cooking to remind yourself to check on food or as a reminder to take your medicine, or as a  reminder  to make a call, or as a reminder to perform another task, etc.  Sleep Apnea Sleep apnea affects breathing during sleep. It causes breathing to stop for a short time or to become shallow. It can also increase the risk of:  Heart attack.  Stroke.  Being very overweight (obese).  Diabetes.  Heart failure.  Irregular heartbeat. The goal of treatment is to help you breathe normally again. What are the causes? There are three kinds of sleep apnea:  Obstructive sleep apnea. This is caused by a blocked or collapsed airway.  Central sleep apnea. This happens when the brain does not send  the right signals to the muscles that control breathing.  Mixed sleep apnea. This is a combination of obstructive and central sleep apnea. The most common cause of this condition is a collapsed or blocked airway. This can happen if:  Your throat muscles are too relaxed.  Your tongue and tonsils are too large.  You are overweight.  Your airway is too small. What increases the risk?  Being overweight.  Smoking.  Having a small airway.  Being older.  Being female.  Drinking alcohol.  Taking medicines to calm yourself (sedatives or tranquilizers).  Having family members with the condition. What are the signs or symptoms?  Trouble staying asleep.  Being sleepy or tired during the day.  Getting angry a lot.  Loud snoring.  Headaches in the morning.  Not being able to focus your mind (concentrate).  Forgetting things.  Less interest in sex.  Mood swings.  Personality changes.  Feelings of sadness (depression).  Waking up a lot during the night to pee (urinate).  Dry mouth.  Sore throat. How is this diagnosed?  Your medical history.  A physical exam.  A test that is done when you are sleeping (sleep study). The test is most often done in a sleep lab but may also be done at home. How is this treated?   Sleeping on your side.  Using a medicine to get rid of mucus in your nose (decongestant).  Avoiding the use of alcohol, medicines to help you relax, or certain pain medicines (narcotics).  Losing weight, if needed.  Changing your diet.  Not smoking.  Using a machine to open your airway while you sleep, such as: ? An oral appliance. This is a mouthpiece that shifts your lower jaw forward. ? A CPAP device. This device blows air through a mask when you breathe out (exhale). ? An EPAP device. This has valves that you put in each nostril. ? A BPAP device. This device blows air through a mask when you breathe in (inhale) and breathe out.  Having  surgery if other treatments do not work. It is important to get treatment for sleep apnea. Without treatment, it can lead to:  High blood pressure.  Coronary artery disease.  In men, not being able to have an erection (impotence).  Reduced thinking ability. Follow these instructions at home: Lifestyle  Make changes that your doctor recommends.  Eat a healthy diet.  Lose weight if needed.  Avoid alcohol, medicines to help you relax, and some pain medicines.  Do not use any products that contain nicotine or tobacco, such as cigarettes, e-cigarettes, and chewing tobacco. If you need help quitting, ask your doctor. General instructions  Take over-the-counter and prescription medicines only as told by your doctor.  If you were given a machine to use while you sleep, use it only as told by your doctor.  If  you are having surgery, make sure to tell your doctor you have sleep apnea. You may need to bring your device with you.  Keep all follow-up visits as told by your doctor. This is important. Contact a doctor if:  The machine that you were given to use during sleep bothers you or does not seem to be working.  You do not get better.  You get worse. Get help right away if:  Your chest hurts.  You have trouble breathing in enough air.  You have an uncomfortable feeling in your back, arms, or stomach.  You have trouble talking.  One side of your body feels weak.  A part of your face is hanging down. These symptoms may be an emergency. Do not wait to see if the symptoms will go away. Get medical help right away. Call your local emergency services (911 in the [place] Do not drive yourself to the hospital. Summary  This condition affects breathing during sleep.  The most common cause is a collapsed or blocked airway.  The goal of treatment is to help you breathe normally while you sleep. This information is not intended to replace advice given to you by your health care  provider. Make sure you discuss any questions you have with your health care provider. Document Released: 12/22/2007 Document Revised: 12/29/2017 Document Reviewed: 11/07/2017 [employer]vier Patient Education  2020 [employer].

## 2019-03-27 NOTE — Progress Notes (Signed)
[facility] [address] [place]. [place] [address] [phone]       HOSPITAL FOLLOW UP NOTE  Rachel Santos Date of Birth:  17-May-1940 Medical Record Number:  [id number]   Reason for Referral:  hospital stroke follow up    CHIEF COMPLAINT:  Chief Complaint  Patient presents with  . Hospitalization Follow-up    Daughter present. Rm 9. No new concerns at this time.     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  History obtained from patient, daughter and chart review. Reviewed all radiology images and labs personally.  Rachel Santos is a 78 y.o. female with history of mitral and tricuspid regurgitation, recurrent paroxysmal A. 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 Procedure was complicated by right groin hematoma 10/27 which she required surgical exploration. She was off her anticoagulation prior to these 2 procedures. Code stroke was called 10/28 at 1:35 PM today when patient was noted has sudden onset of difficulty speaking with right facial droop by the RN. She was not a tPA or IR candidate.  Stroke work-up revealed L>R parietal and left cerebellar infarcts as evidenced on MRI embolic pattern secondary to known AF on AC as above.  MRA negative LVO with mild intracranial atherosclerosis noted.  Recent carotid Doppler on 01/02/2019 no significant bilateral extracranial stenosis.  2D echo showed an EF of 65 to 70% without cardiac source of embolus identified.  Recommended restarting Xarelto or Eliquis at discharge for secondary stroke prevention with history of atrial  fibrillation.  HTN stable with BP goal normotensive range.  Continuation of Mevacor for HLD management with LDL 44.  Controlled DM with A1c 7.0.  Other stroke risk factors include advanced age, history of TIAs, CAD, MVR and TR s/p repair in 2014, and recent TAVR.  Other active problems include CKD, right groin hematoma status post TAVR s/p OR for evacuation with femoral repair and acute blood loss anemia requiring transfusion.  Residual deficits of mild expressive aphasia and was discharged home with recommendation of home health therapy.  She then presented to [facility] ED on 02/04/2019 after a transient episode of slurred speech and right arm weakness.  CT head unremarkable but MRI showed several small acute cortical-based infarcts in the left frontal parietal and occipital lobes embolic source.  CTA head/neck showed severe narrowing of the left MCA and moderate to severe narrowing of left PCA felt due to intracranial atherosclerotic disease.  Felt as though recent stroke due to intracranial arthrosclerosis and recommended adding aspirin 81 mg daily in addition to Xarelto along with continuation of statin therapy.  Recommended resuming home health therapies at discharge.  Initial visit 03/27/2019: Rachel Santos is a 78 year old female who is being seen today for hospital follow-up accompanied by her daughter.  Residual deficits of cognitive impairment and fatigue.  She was initially being seen by home health therapies while she was staying with her daughter but this has since been completed and has been able to return to her own home.  Daughters check on her frequently.  She does endorse mild memory loss prior to her stroke but daughter endorses worsening after stroke.  Daughter questioning whether she can  have Alzheimer's testing to see how much memory loss is from a stroke and how much was from prior.  Daughters are also frustrated that patient is sedentary throughout the day frequently on her cell phone  scrolling through [place].  She does endorse occasionally doing exercises as recommended during therapy.  Patient also endorses daytime fatigue, napping during the day and nocturia.  She continues on Xarelto and aspirin 81 mg daily without bleeding or bruising.  Continues on atorvastatin 40 mg daily without myalgias.  Glucose levels have been stable.  Blood pressure today 141/73.  She does monitor blood pressure at home which has been stable.  History of depression/anxiety which has been stable on Effexor and BuSpar.  No further concerns at this time.   ROS:   14 system review of systems performed and negative with exception of fatigue, palpitations, hearing loss, incontinence, joint pain, aching muscles, memory loss, confusion, weakness, slurred speech, dizziness, passing out, depression, anxiety, decreased energy, disinterest activities and insomnia  PMH:  Past Medical History:  Diagnosis Date  . Acute blood loss anemia, transfused 1 unit PRBC 01/26/19 01/27/2019  . Anxiety   . Arthritis    knees  . Atrial fibrillation ([place])   . Cataracts, bilateral   . Diabetes mellitus   . GERD (gastroesophageal reflux disease)   . History of TIA (transient ischemic attack)    mini stroke with vision problems  . Hypertension   . Mitral regurgitation   . S/P Maze operation for atrial fibrillation 10/11/2012   Complete bilateral atrial lesion set using bipolar radiofrequency and cryothermy ablation with clipping of LA appendage  . 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   26mm Sorin Memo 3D ring annuloplasty with 26 mm Edwards mc3 tricuspid ring annuloplasty   . 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 Tricuspid regurgitation     PSH:  Past Surgical History:  Procedure Laterality Date  . ARTERY REPAIR Right 01/22/2019   Procedure: Right Common Femoral Artery Repair;  Surgeon: Angelia Mould, MD;  Location:  [place];  Service: Vascular;  Laterality: Right;  . CARDIAC CATHETERIZATION  09-05-12  . CARDIOVERSION N/A 07/31/2012   Procedure: CARDIOVERSION;  Surgeon: Josue Hector, MD;  Location: [place];  Service: Cardiovascular;  Laterality: N/A;  . CESAREAN SECTION     x 2  . COLONOSCOPY WITH PROPOFOL N/A 11/28/2014   Procedure: COLONOSCOPY WITH PROPOFOL;  Surgeon: Carol Ada, MD;  Location: [facility];  Service: Endoscopy;  Laterality: N/A;  . FEMORAL ARTERY EXPLORATION Right 01/22/2019   Procedure: RIGHT GROIN EXPLORATION with evacuation of hematoma;  Surgeon: Angelia Mould, MD;  Location: [facility] OR;  Service: Vascular;  Laterality: Right;  . FLEXIBLE SIGMOIDOSCOPY N/A 12/18/2015   Procedure: FLEXIBLE SIGMOIDOSCOPY;  Surgeon: Carol Ada, MD;  Location: [facility];  Service: Endoscopy;  Laterality: N/A;  . INTRAOPERATIVE TRANSESOPHAGEAL ECHOCARDIOGRAM N/A 10/11/2012   Procedure: INTRAOPERATIVE TRANSESOPHAGEAL ECHOCARDIOGRAM;  Surgeon: Rexene Alberts, MD;  Location: [place];  Service: Open Heart Surgery;  Laterality: N/A;  . MASTECTOMY Left 1982  . MAZE N/A 10/11/2012   Procedure: MAZE;  Surgeon: Rexene Alberts, MD;  Location: [place];  Service: Open Heart Surgery;  Laterality: N/A;  . MITRAL VALVE REPAIR N/A 10/11/2012   Procedure: MITRAL VALVE REPAIR (MVR);  Surgeon: Rexene Alberts, MD;  Location: [place];  Service: Open Heart Surgery;  Laterality: N/A;  . RIGHT  HEART CATH AND CORONARY ANGIOGRAPHY N/A 12/24/2018   Procedure: RIGHT HEART CATH AND CORONARY ANGIOGRAPHY;  Surgeon: [name], MD;  Location: [place] [facility];  Service: Cardiovascular;  Laterality: N/A;  . TEE WITHOUT CARDIOVERSION N/A 07/31/2012   Procedure: TRANSESOPHAGEAL ECHOCARDIOGRAM (TEE);  Surgeon: Josue Hector, MD;  Location: [place];  Service: Cardiovascular;  Laterality: N/A;  . TEE WITHOUT CARDIOVERSION N/A 01/22/2019   Procedure: TRANSESOPHAGEAL ECHOCARDIOGRAM (TEE);  Surgeon: [name], MD;  Location: [place] [facility];  Service: Open Heart Surgery;  Laterality: N/A;  . TRANSCATHETER AORTIC VALVE REPLACEMENT, TRANSFEMORAL N/A 01/22/2019   Procedure: TRANSCATHETER AORTIC VALVE REPLACEMENT, TRANSFEMORAL;  Surgeon: [name], MD;  Location: [place] [facility];  Service: Open Heart Surgery;  Laterality: N/A;  . TRICUSPID VALVE REPLACEMENT N/A 10/11/2012   Procedure: TRICUSPID VALVE REPAIR;  Surgeon: Rexene Alberts, MD;  Location: [place];  Service: Open Heart Surgery;  Laterality: N/A;  . TUBAL LIGATION      Social History:  Social History   Socioeconomic History  . Marital status: Widowed    Spouse name: Not on file  . Number of children: 3  . Years of education: Not on file  . Highest education level: Not on file  Occupational History  . Occupation: Air cabin crew  Tobacco Use  . Smoking status: Never Smoker  . Smokeless tobacco: Never Used  Substance and Sexual Activity  . Alcohol use: No  . Drug use: No  . Sexual activity: Never  Other Topics Concern  . Not on file  Social History Narrative  . Not on file   Social Determinants of Health   Financial Resource Strain:   . Difficulty of Paying Living Expenses: Not on file  Food Insecurity:   . Worried About Charity fundraiser in the Last Year: Not on file  . Ran Out of Food in the Last Year: Not on file  Transportation Needs:   . Lack of Transportation (Medical): Not on file  . Lack of Transportation (Non-Medical): Not on file  Physical Activity:   . Days of Exercise per Week: Not on file  . Minutes of Exercise per Session: Not on file  Stress:   . Feeling of Stress : Not on file  Social Connections:   . Frequency of Communication with Friends and Family: Not on file  . Frequency of Social Gatherings with Friends and Family: Not on file  . Attends Religious Services: Not on file  . Active Member of Clubs or Organizations: Not on file  . Attends Theatre manager Meetings: Not on file  . Marital Status: Not on file  Intimate Partner Violence:   . Fear of Current or Ex-Partner: Not on file  . Emotionally Abused: Not on file  . Physically Abused: Not on file  . Sexually Abused: Not on file    Family History:  Family History  Problem Relation Age of Onset  . Cancer Father        prostate  . Hypertension Father   . Heart attack Paternal Grandfather   . Stroke Neg Hx     Medications:   Current Outpatient Medications on File Prior to Visit  Medication Sig Dispense Refill  . acetaminophen (TYLENOL) 500 MG tablet Take 1,000 mg by mouth every 6 (six) hours as needed for moderate pain or headache.    Marland Kitchen aspirin EC 81 MG tablet Take 81 mg by mouth daily.    Marland Kitchen atorvastatin (  LIPITOR) 40 MG tablet Take 40 mg by mouth daily.    . busPIRone (BUSPAR) 10 MG tablet Take 10 mg by mouth 2 (two) times daily.     . Calcium Carb-Cholecalciferol (CALCIUM PLUS VITAMIN D3) 600-500 MG-UNIT CAPS Take 1 tablet by mouth 2 (two) times daily.    . Cyanocobalamin (VITAMIN B-12 PO) Take 1 tablet by mouth daily.    . ferrous sulfate 325 (65 FE) MG tablet Take 325 mg by mouth daily.    . Insulin Detemir (LEVEMIR FLEXPEN) 100 UNIT/ML Pen Inject 32 Units into the skin daily.     . magnesium hydroxide (MILK OF MAGNESIA) 400 MG/5ML suspension Take 30 mLs by mouth as needed.    . Melatonin 5 MG CAPS Take 5 mg by mouth at bedtime as needed (sleep).    Marland Kitchen omeprazole (PRILOSEC) 40 MG capsule Take 40 mg by mouth daily before breakfast.     . Rivaroxaban (XARELTO) 15 MG TABS tablet Take 15 mg by mouth daily with supper.    . Semaglutide,0.25 or 0.5MG /DOS, (OZEMPIC, 0.25 OR 0.5 MG/DOSE,) 2 MG/1.5ML SOPN Inject 0.5 mg into the skin every Monday.     . venlafaxine XR (EFFEXOR-XR) 150 MG 24 hr capsule Take 150 mg by mouth daily with breakfast. Take with 75 mg capsule to equal 225 mg daily    . venlafaxine XR (EFFEXOR-XR) 75 MG 24 hr capsule Take 75 mg by mouth daily with  breakfast. Take with 150 mg capsule to equal 225 mg daily     No current facility-administered medications on file prior to visit.    Allergies:   Allergies  Allergen Reactions  . Metformin And Related Diarrhea  . Ultram [Tramadol] Nausea And Vomiting  . Nickel Rash    Pt unable to wear jewelry made of nickel.  . Pneumococcal Vaccines Swelling and Rash    At injection set     Physical Exam  Vitals:   03/27/19 1306  BP: (!) 141/73  Pulse: 72  Temp: 97.6 F (36.4 C)  TempSrc: Oral  Weight: 148 lb 3.2 oz (67.2 kg)  Height: 5\' 6"  (1.676 m)   Body mass index is 23.92 kg/m. No exam data present  Depression screen [facility] 2/9 03/27/2019  Decreased Interest 0  Down, Depressed, Hopeless 0  PHQ - 2 Score 0     General: well developed, well nourished,  pleasant elderly Caucasian female, seated, in no evident distress Head: head normocephalic and atraumatic.   Neck: supple with no carotid or supraclavicular bruits Cardiovascular: regular rate and rhythm, no murmurs Musculoskeletal: no deformity Skin:  no rash/petichiae Vascular:  Normal pulses all extremities   Neurologic Exam Mental Status: Awake and fully alert.   Normal speech and language.  Oriented to place and time. Recent and remote memory intact during visit. Attention span, concentration and fund of knowledge appropriate during visit.  Able to do serial additions without difficulty.  Animal naming 7 in 60 seconds.  Recall 2/3.  Clock drawing 4/4.  Mood and affect appropriate.  Cranial Nerves: Fundoscopic exam reveals sharp disc margins. Pupils equal, briskly reactive to light. Extraocular movements full without nystagmus. Visual fields full to confrontation. Hearing intact. Facial sensation intact. Face, tongue, palate moves normally and symmetrically.  Motor: Normal bulk and tone. Normal strength in all tested extremity muscles. Sensory.: intact to touch , pinprick , position and vibratory sensation.  Coordination: Rapid  alternating movements normal in all extremities. Finger-to-nose and heel-to-shin performed accurately bilaterally. Gait and Station: Arises from  chair without difficulty. Stance is normal. Gait demonstrates  slow shuffled steps but is able to ambulate without assistive device Reflexes: 1+ and symmetric. Toes downgoing.     NIHSS  0 Modified Rankin  2    Diagnostic Data (Labs, Imaging, Testing)  CT HEAD WO CONTRAST 01/23/2019 IMPRESSION: No acute finding by CT. Mild age related atrophy. Atherosclerotic calcification of the major vessels at the base of the brain.  MR BRAIN WO CONTRAST MR ANGIO HEAD WO CONTRAST 01/24/2019 IMPRESSION: 1. Multifocal acute ischemia scattered within both hemispheres, left-greater-than-right, and in the left cerebellum. The pattern is most consistent with a central embolic source, particularly in a patient with a history of atrial fibrillation. 2. No emergent large vessel occlusion or high-grade stenosis. 3. Mild intracranial atherosclerosis.  CT ANGIO HEAD W OR WO CONTRAST CT ANGIO NECK W OR WO CONTRAST 02/05/2019 ([facility]) IMPRESSION: 1. Severe narrowing of the LEFT middle cerebral artery and moderate to severe narrowing of the LEFT posterior cerebral artery, as described above. This is most likely due to intracranial atherosclerotic disease. 2. Mild extracranial atherosclerotic disease without a significant stenosis. 3. 1.6 cm LEFT thyroid nodule. Follow up thyroid ultrasound recommended for further characterization. Bernette [employer] White Paper-February 2015) Degree of stenosis is determined using NASCET measurement technique: Severe: 70-99% Moderate: 50-69% Mild: Less than 50%  MR BRAIN WO CONTRAST 02/05/2019 ([facility]) IMPRESSION: Several small acute cortical based infarcts in the left frontal parietal and occipital lobes which may represent an embolic source. No intracranial hemorrhage. No significant edema mass effect or midline shift.    ECHOCARDIOGRAM 01/22/2019 IMPRESSIONS  1. Left ventricular ejection fraction, by visual estimation, is 60 to 65%. The left ventricle has normal function. There is mildly increased left ventricular hypertrophy.  2. Elevated mean left atrial pressure.  3. Indeterminate findings due to mitral stenosis. pattern of LV diastolic filling.  4. Global right ventricle has normal systolic function.The right ventricular size is normal. No increase in right ventricular wall thickness.  5. Right atrial size was normal.  6. Moderate thickening of the mitral valve leaflet(s).  7. Severe mitral annular calcification.  8. The mitral valve has been repaired with an annuloplasty ring. Mild mitral valve regurgitation. Mild-moderate mitral stenosis.  9. Gradients across the mitral valve are higher compared to the August 2020 study, despite a slower heart rate. However, today's study shows mitral valve gradients similar to the 2018 and 2019 echo studies. 10. The aortic valve is tricuspid Aortic valve regurgitation was not visualized by color flow Doppler. Severe aortic valve stenosis. 11. The pulmonic valve was grossly normal. Pulmonic valve regurgitation is not visualized by color flow Doppler. 12. TR signal is inadequate for assessing pulmonary artery systolic pressure. 13. The interatrial septum was not assessed. 14. Left atrial size was moderately dilated. 15. The tricuspid valve is normal in structure. Tricuspid valve regurgitation was not visualized by color flow Doppler. 16. S/P surgical tricuspid valve repair.       ASSESSMENT: Rachel Santos is a 78 y.o. year old female with history of atrial fibrillation off AC due to undergoing TAVR on [id number] complicated by right groin hematoma who developed difficulty speaking and right facial droop on 01/23/2019 therefore evaluated by stroke team with finding of L>R parietal and left cerebellar infarcts embolic secondary to known AF of AC due to recent  procedure.  She then presented to [facility] on 02/04/2019 and found to have multiple left parietal and occipital infarcts felt to be due to intracranial stenosis with  CTA head/neck showing left MCA and PCA severe narrowing.  Is recommended to add aspirin 81 mg daily in addition to Xarelto for secondary stroke prevention. Vascular risk factors include A. fib, HTN, HLD, CAD, history of TIAs, MVR and TR s/p repair in 2014 and recent aortic valve replacement.  Residual deficits of cognitive impairment but overall recovered well    PLAN:  1. Multiple strokes: Continue aspirin 81 mg daily and Xarelto (rivaroxaban) daily  and atorvastatin for secondary stroke prevention. Maintain strict control of hypertension with blood pressure goal between 1 30-1 50, diabetes with hemoglobin A1c goal below 6.5% and cholesterol with LDL cholesterol (bad cholesterol) goal below 70 mg/dL.  I also advised the patient to eat a healthy diet with plenty of whole grains, cereals, fruits and vegetables, exercise regularly with at least 30 minutes of continuous activity daily and maintain ideal body weight. 2. Atrial fibrillation: Continuation of Xarelto and ongoing follow-up with cardiology 3. Intracranial stenosis: Recommend repeating CTA head to assess moderate to severe bilateral stenosis as evidenced on CTA head during [facility] admission as MRA head during [place] admission did not show evidence of stenosis.  If stenosis present, will refer to vascular surgery for further monitoring and management.  If no stenosis present, will consider discontinuing aspirin at that time.  Advised at this time to continue blood pressure goal between 1 30-1 50 to ensure adequate perfusion 4. Cognitive impairment, poststroke: Discussion regarding potential benefit of additional therapy but patient declines at this time.  Discussion regarding importance of continuing exercises as recommended during therapy sessions along with maintaining a healthy  diet and adequate exercise.  She was advised to call office during the interval time if she wishes to pursue therapy.  Will obtain MMSE at follow-up visit if memory concerns remain 5. Suspect sleep apnea: High risk of sleep apnea with recent strokes and history of atrial fibrillation along with symptoms of daytime fatigue, insomnia and nocturia.  Referral placed to [facility] for further evaluation 6. HTN: Advised to continue current treatment regimen.  Today's BP stable.  Advised to continue to monitor at home along with continued follow-up with PCP for management 7. HLD: Advised to continue current treatment regimen along with continued follow-up with PCP for future prescribing and monitoring of lipid panel 8. DMII: Advised to continue to monitor glucose levels at home along with continued follow-up with PCP for management and monitoring    Follow up in 2 months or call earlier if needed   Greater than 50% of time during this 45 minute visit was spent on counseling, explanation of diagnosis of multiple strokes, reviewing risk factor management of intracranial stenosis, atrial fibrillation, HTN, HLD and DM, discussion regarding possible sleep apnea and risk of untreated apnea, discussion regarding cognitive impairment, planning of further management along with potential future management, and discussion with patient and family answering all questions.    Frann Rider, AGNP-BC  [facility] [address] [place], [place] [address]  Phone [phone] Fax [phone] Note: This document was prepared with digital dictation and possible smart phrase technology. Any transcriptional errors that result from this process are unintentional.

## 2019-04-02 ENCOUNTER — Telehealth: Payer: Self-pay | Admitting: *Deleted

## 2019-04-02 DIAGNOSIS — E041 Nontoxic single thyroid nodule: Secondary | ICD-10-CM

## 2019-04-02 NOTE — Progress Notes (Signed)
I agree with the above plan 

## 2019-04-02 NOTE — Telephone Encounter (Signed)
Visit Follow-Up Question  Eileen Stanford, PA-C  You 7 days ago   Can you put in a referral for this for the pt and let them know we did it? Its for a thyroid nodule. Thanks  [employer] text   You routed conversation to Eileen Stanford, PA-C 13 days ago  [place], Sar[place], [place], [place] 13 days ago   She does need a referral for the office I would like her to go to.  Can you do the referral?   It's for [facility] on [facility] in [place].  Please let me know if this is something you can or can't do. Thanks   [employer]

## 2019-04-04 ENCOUNTER — Encounter: Payer: Self-pay | Admitting: Adult Health

## 2019-04-04 DIAGNOSIS — E1121 Type 2 diabetes mellitus with diabetic nephropathy: Secondary | ICD-10-CM | POA: Diagnosis not present

## 2019-04-04 DIAGNOSIS — Z794 Long term (current) use of insulin: Secondary | ICD-10-CM | POA: Diagnosis not present

## 2019-04-09 DIAGNOSIS — I10 Essential (primary) hypertension: Secondary | ICD-10-CM | POA: Diagnosis not present

## 2019-04-09 DIAGNOSIS — E1043 Type 1 diabetes mellitus with diabetic autonomic (poly)neuropathy: Secondary | ICD-10-CM | POA: Diagnosis not present

## 2019-04-09 DIAGNOSIS — E782 Mixed hyperlipidemia: Secondary | ICD-10-CM | POA: Diagnosis not present

## 2019-04-09 DIAGNOSIS — I4891 Unspecified atrial fibrillation: Secondary | ICD-10-CM | POA: Diagnosis not present

## 2019-04-09 DIAGNOSIS — E1121 Type 2 diabetes mellitus with diabetic nephropathy: Secondary | ICD-10-CM | POA: Diagnosis not present

## 2019-04-22 ENCOUNTER — Ambulatory Visit
Admission: RE | Admit: 2019-04-22 | Discharge: 2019-04-22 | Disposition: A | Discharge status: Home / Self Care | Payer: Medicare Other | Source: Ambulatory Visit | Attending: Adult Health | Admitting: Adult Health

## 2019-04-22 ENCOUNTER — Other Ambulatory Visit: Payer: Self-pay

## 2019-04-22 DIAGNOSIS — I6523 Occlusion and stenosis of bilateral carotid arteries: Secondary | ICD-10-CM | POA: Diagnosis not present

## 2019-04-22 MED ORDER — IOPAMIDOL (ISOVUE-370) INJECTION 76%
75.0000 mL | Freq: Once | INTRAVENOUS | Status: AC | PRN
  Administered 2019-04-22: 75 mL via INTRAVENOUS

## 2019-04-24 ENCOUNTER — Telehealth: Payer: Self-pay | Admitting: *Deleted

## 2019-04-24 ENCOUNTER — Telehealth (HOSPITAL_COMMUNITY): Payer: Self-pay

## 2019-04-24 ENCOUNTER — Other Ambulatory Visit: Payer: Self-pay | Admitting: Adult Health

## 2019-04-24 ENCOUNTER — Other Ambulatory Visit (HOSPITAL_COMMUNITY): Payer: Self-pay | Admitting: Interventional Radiology

## 2019-04-24 DIAGNOSIS — I771 Stricture of artery: Secondary | ICD-10-CM

## 2019-04-24 DIAGNOSIS — I6523 Occlusion and stenosis of bilateral carotid arteries: Secondary | ICD-10-CM

## 2019-04-24 NOTE — Telephone Encounter (Signed)
Called to schedule angiogram, no answer, left vm. AW 

## 2019-04-24 NOTE — Telephone Encounter (Signed)
-----   Message from Ronney Lion, [place] sent at 04/24/2019  2:14 PM EST ----- Lia Foyer,  I had Dr. Estanislado Pandy review. He would like to set patient up for an image-guided diagnostic cerebral arteriogram, first available. No need to consult prior unless requested by patient. Please schedule and please let me know if you have any questions.  Thanks!Ally ----- Message ----- From: Danielle Dess Sent: 04/24/2019  11:10 AM EST To: Edmon Crape,   New referral from [facility]. Moderate left PCA stenosis on recent CTA. Please review.   Thanks,  Lia Foyer

## 2019-04-24 NOTE — Telephone Encounter (Signed)
-----   Message from Frann Rider, NP sent at 04/24/2019  6:46 AM EST ----- Please advise patient/daughter that recent CTA did show continued stenosis in the left posterior artery and will refer to vascular surgery for further evaluation as discussed during visit

## 2019-04-24 NOTE — Telephone Encounter (Signed)
I called pt and relayed that the CTA results were back and per JM/NP did show continued stenosis in the left artery.  Will refer to VS for futher evaluation.  Dr. Estanislado Pandy. She will have daughter call back if she has questions.

## 2019-04-25 ENCOUNTER — Encounter: Payer: Self-pay | Admitting: Adult Health

## 2019-04-25 DIAGNOSIS — I6340 Cerebral infarction due to embolism of unspecified cerebral artery: Secondary | ICD-10-CM | POA: Diagnosis not present

## 2019-04-25 DIAGNOSIS — E782 Mixed hyperlipidemia: Secondary | ICD-10-CM | POA: Diagnosis not present

## 2019-04-25 DIAGNOSIS — I4891 Unspecified atrial fibrillation: Secondary | ICD-10-CM | POA: Diagnosis not present

## 2019-04-25 DIAGNOSIS — E1043 Type 1 diabetes mellitus with diabetic autonomic (poly)neuropathy: Secondary | ICD-10-CM | POA: Diagnosis not present

## 2019-04-25 DIAGNOSIS — E1065 Type 1 diabetes mellitus with hyperglycemia: Secondary | ICD-10-CM | POA: Diagnosis not present

## 2019-04-26 ENCOUNTER — Encounter: Payer: Self-pay | Admitting: Adult Health

## 2019-04-30 ENCOUNTER — Telehealth (HOSPITAL_COMMUNITY): Payer: Self-pay

## 2019-04-30 NOTE — Telephone Encounter (Signed)
Called daughter to schedule angiogram, no answer, left vm. AW

## 2019-05-01 ENCOUNTER — Telehealth (HOSPITAL_COMMUNITY): Payer: Self-pay

## 2019-05-01 NOTE — Telephone Encounter (Signed)
Called pt's daughter back. No answer, left vm. AW

## 2019-05-01 NOTE — Telephone Encounter (Signed)
Pt's daughter called back. She wanted to get clearance from cardiologist regarding Xarelto and diagnostic angiogram. The cardiologist is out of town and will return next week. She will call me with an update when he returns.

## 2019-05-02 ENCOUNTER — Telehealth (HOSPITAL_COMMUNITY): Payer: Self-pay

## 2019-05-02 ENCOUNTER — Other Ambulatory Visit (HOSPITAL_COMMUNITY): Payer: Self-pay | Admitting: Interventional Radiology

## 2019-05-02 DIAGNOSIS — I771 Stricture of artery: Secondary | ICD-10-CM

## 2019-05-02 NOTE — Telephone Encounter (Signed)
Returned [facility]. She wants to schedule consult prior to angio because she has questions. No answer. Left her a message to let her know that we have Wed. And Thur. Available for a consult. AW

## 2019-05-09 ENCOUNTER — Ambulatory Visit (HOSPITAL_COMMUNITY)
Admission: RE | Admit: 2019-05-09 | Discharge: 2019-05-09 | Disposition: A | Discharge status: Home / Self Care | Payer: Medicare Other | Source: Ambulatory Visit | Attending: Interventional Radiology | Admitting: Interventional Radiology

## 2019-05-09 ENCOUNTER — Other Ambulatory Visit: Payer: Self-pay

## 2019-05-09 DIAGNOSIS — I771 Stricture of artery: Secondary | ICD-10-CM

## 2019-05-09 NOTE — Consult Note (Signed)
Chief Complaint: Patient was seen in consultation today for intracranial stenosis  Referring Physician(s): Dr. Leonie Santos  Supervising Physician: Rachel Santos  Patient Status: [facility] - Out-pt  History of Present Illness: Rachel Santos is a 79 y.o. 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  Patient was noted to have a large right groin hematoma at the time of her TAVR which required surgical exploration.  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 She was resumed on Xarelto at discharge.  Due to suggestion of MCA and PCA stenosis on her MRA, new since 2016, she is referred to Neuro Interventional Radiology for possible diagnostic angiogram. She presents today to discuss the results of her imaging as well as the possibility of diagnostic angiogram. She is accompanied by her daughter, Rachel Santos. Rachel Santos available by speaker phone.   Patient and her daughter report that Rachel Santos has been improving at home since discharge. She has been able to resume living independently at home.  She and her daughter report ongoing cognitive impairments, mostly with memory loss or forgetfulness.  She does use a walker in her home, but is otherwise able to take care of herself and perform ADLs on her own.  Her daughters assist with grocery shopping and heavy chores.   Patient and her daughters would like to proceed with diagnostic angiogram, however they do have concerns for holding her anticoagulation given her stroke after holding for cardiac procedure.  Daughters state that patient has recently seen Rachel Santos and report  they were given clearance to hold vs. Bridge her from [place] for procedure, but that this would need to occur under the direction of her Neurologist.     Past Medical History:  Diagnosis Date   . Acute blood loss anemia, transfused 1 unit PRBC 01/26/19 01/27/2019  . Anxiety   . Arthritis    knees  . Atrial fibrillation ([place])   . Cataracts, bilateral   . Diabetes mellitus   . GERD (gastroesophageal reflux disease)   . History of TIA (transient ischemic attack)    mini stroke with vision problems  . Hypertension   . Mitral regurgitation   . S/P Maze operation for atrial fibrillation 10/11/2012   Complete bilateral atrial lesion set using bipolar radiofrequency and cryothermy ablation with clipping of LA appendage  . 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   72m Sorin Memo 3D ring annuloplasty with 26 mm Edwards mc3 tricuspid ring annuloplasty   . 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 Tricuspid regurgitation     Past Surgical History:  Procedure Laterality Date  . ARTERY REPAIR Right 01/22/2019   Procedure: Right Common Femoral Artery Repair;  Surgeon: DAngelia Mould MD;  Location[place] [facility]  Service: Vascular;  Laterality: Right;  . CARDIAC CATHETERIZATION  09-05-12  . CARDIOVERSION N/A 07/31/2012   Procedure: CARDIOVERSION;  Surgeon: PJosue Hector MD;  Location: [facility]  Service: Cardiovascular;  Laterality: N/A;  . CESAREAN SECTION     x 2  . COLONOSCOPY WITH PROPOFOL N/A 11/28/2014   Procedure: COLONOSCOPY WITH PROPOFOL;  Surgeon: PCarol Ada MD;  Location: [facility];  Service: Endoscopy;  Laterality: N/A;  . FEMORAL ARTERY EXPLORATION Right 01/22/2019   Procedure: RIGHT GROIN  EXPLORATION with evacuation of hematoma;  Surgeon: Angelia Mould, MD;  Location: [facility];  Service: Vascular;  Laterality: Right;  . FLEXIBLE SIGMOIDOSCOPY N/A 12/18/2015   Procedure: FLEXIBLE SIGMOIDOSCOPY;  Surgeon: Carol Ada, MD;  Location: [facility];  Service: Endoscopy;  Laterality: N/A;  . INTRAOPERATIVE TRANSESOPHAGEAL ECHOCARDIOGRAM N/A 10/11/2012   Procedure: INTRAOPERATIVE  TRANSESOPHAGEAL ECHOCARDIOGRAM;  Surgeon: Rexene Alberts, MD;  Location: [place];  Service: Open Heart Surgery;  Laterality: N/A;  . MASTECTOMY Left 1982  . MAZE N/A 10/11/2012   Procedure: MAZE;  Surgeon: Rexene Alberts, MD;  Location: [place];  Service: Open Heart Surgery;  Laterality: N/A;  . MITRAL VALVE REPAIR N/A 10/11/2012   Procedure: MITRAL VALVE REPAIR (MVR);  Surgeon: Rexene Alberts, MD;  Location: [place];  Service: Open Heart Surgery;  Laterality: N/A;  . RIGHT HEART CATH AND CORONARY ANGIOGRAPHY N/A 12/24/2018   Procedure: RIGHT HEART CATH AND CORONARY ANGIOGRAPHY;  Surgeon: [name], MD;  Location: [place] [facility];  Service: Cardiovascular;  Laterality: N/A;  . TEE WITHOUT CARDIOVERSION N/A 07/31/2012   Procedure: TRANSESOPHAGEAL ECHOCARDIOGRAM (TEE);  Surgeon: Josue Hector, MD;  Location: [place];  Service: Cardiovascular;  Laterality: N/A;  . TEE WITHOUT CARDIOVERSION N/A 01/22/2019   Procedure: TRANSESOPHAGEAL ECHOCARDIOGRAM (TEE);  Surgeon: [name], MD;  Location: [place] [facility];  Service: Open Heart Surgery;  Laterality: N/A;  . TRANSCATHETER AORTIC VALVE REPLACEMENT, TRANSFEMORAL N/A 01/22/2019   Procedure: TRANSCATHETER AORTIC VALVE REPLACEMENT, TRANSFEMORAL;  Surgeon: [name], MD;  Location: [place] [facility];  Service: Open Heart Surgery;  Laterality: N/A;  . TRICUSPID VALVE REPLACEMENT N/A 10/11/2012   Procedure: TRICUSPID VALVE REPAIR;  Surgeon: Rexene Alberts, MD;  Location: [place];  Service: Open Heart Surgery;  Laterality: N/A;  . TUBAL LIGATION      Allergies: Metformin and related, Ultram [tramadol], Nickel, and Pneumococcal vaccines  Medications: Prior to Admission medications   Medication Sig Start Date End Date Taking? Authorizing Provider  acetaminophen (TYLENOL) 500 MG tablet Take 1,000 mg by mouth every 6 (six) hours as needed for moderate pain or headache.    [provider]  aspirin EC 81 MG  tablet Take 81 mg by mouth daily.    [provider]  atorvastatin (LIPITOR) 40 MG tablet Take 40 mg by mouth daily.    [provider]  busPIRone (BUSPAR) 10 MG tablet Take 10 mg by mouth 2 (two) times daily.     [provider]  Calcium Carb-Cholecalciferol (CALCIUM PLUS VITAMIN D3) 600-500 MG-UNIT CAPS Take 1 tablet by mouth 2 (two) times daily.    [provider]  Cyanocobalamin (VITAMIN B-12 PO) Take 1 tablet by mouth daily.    [provider]  ferrous sulfate 325 (65 FE) MG tablet Take 325 mg by mouth daily.    [provider]  Insulin Detemir (LEVEMIR FLEXPEN) 100 UNIT/ML Pen Inject 32 Units into the skin daily.     [provider]  magnesium hydroxide (MILK OF MAGNESIA) 400 MG/5ML suspension Take 30 mLs by mouth as needed.    [provider]  Melatonin 5 MG CAPS Take 5 mg by mouth at bedtime as needed (sleep).    [provider]  omeprazole (PRILOSEC) 40 MG capsule Take 40 mg by mouth daily before breakfast.  06/28/12   [provider]  Rivaroxaban (XARELTO) 15 MG TABS tablet Take 15 mg by mouth daily with supper.    [provider]  Semaglutide,0.25 or 0.5MG/DOS, (OZEMPIC, 0.25 OR 0.5 MG/DOSE,) 2 MG/1.5ML SOPN Inject 0.5 mg into the skin every Monday.     [provider]  venlafaxine XR (EFFEXOR-XR) 150 MG 24 hr capsule Take 150 mg by mouth daily with breakfast. Take with 75 mg capsule to equal 225 mg daily    [provider]  venlafaxine XR (EFFEXOR-XR) 75 MG 24 hr capsule Take 75 mg by mouth daily with breakfast. Take with 150 mg capsule to equal 225 mg daily    [provider]     Family History  Problem Relation Age of Onset  . Cancer Father        prostate  . Hypertension Father   . Heart attack Paternal Grandfather   . Stroke Neg Hx     Social History   Socioeconomic History  . Marital status: Widowed    Spouse name: Not on file  . Number of  children: 3  . Years of education: Not on file  . Highest education level: Not on file  Occupational History  . Occupation: Air cabin crew  Tobacco Use  . Smoking status: Never Smoker  . Smokeless tobacco: Never Used  Substance and Sexual Activity  . Alcohol use: No  . Drug use: No  . Sexual activity: Never  Other Topics Concern  . Not on file  Social History Narrative  . Not on file   Social Determinants of Health   Financial Resource Strain:   . Difficulty of Paying Living Expenses: Not on file  Food Insecurity:   . Worried About Charity fundraiser in the Last Year: Not on file  . Ran Out of Food in the Last Year: Not on file  Transportation Needs:   . Lack of Transportation (Medical): Not on file  . Lack of Transportation (Non-Medical): Not on file  Physical Activity:   . Days of Exercise per Week: Not on file  . Minutes of Exercise per Session: Not on file  Stress:   . Feeling of Stress : Not on file  Social Connections:   . Frequency of Communication with Friends and Family: Not on file  . Frequency of Social Gatherings with Friends and Family: Not on file  . Attends Religious Services: Not on file  . Active Member of Clubs or Organizations: Not on file  . Attends Archivist Meetings: Not on file  . Marital Status: Not on file     Review of Systems: A 12 point ROS discussed and pertinent positives are indicated in the HPI above.  All other systems are negative.  Review of Systems  Constitutional: Negative for fatigue and fever.  Respiratory: Negative for cough and shortness of breath.   Cardiovascular: Negative for chest pain.  Gastrointestinal: Negative for abdominal pain, nausea and vomiting.  Musculoskeletal: Negative for back pain.  Neurological: Positive for dizziness. Negative for facial asymmetry, weakness, light-headedness and headaches.  Psychiatric/Behavioral: Negative for behavioral problems and confusion.     Vital Signs: There were no vitals taken for this visit.  Physical Exam Vitals and nursing note reviewed.  Constitutional:      General: She is not in acute distress.    Appearance: Normal appearance. She is not ill-appearing.  HENT:     Mouth/Throat:     Mouth: Mucous membranes are moist.     Pharynx: Oropharynx is clear.  Eyes:     Extraocular Movements: Extraocular movements intact.  Pulmonary:     Effort: Pulmonary  effort is normal. No respiratory distress.  Skin:    General: Skin is warm and dry.  Neurological:     General: No focal deficit present.     Mental Status: She is alert and oriented to person, place, and time. Mental status is at baseline.  Psychiatric:        Mood and Affect: Mood normal.        Behavior: Behavior normal.        Thought Content: Thought content normal.        Judgment: Judgment normal.     Imaging: CT ANGIO HEAD W OR WO CONTRAST  Result Date: 04/22/2019 CLINICAL DATA:  Stenosis of intracranial portion of both internal carotid arteries. Surveillance. EXAM: CT ANGIOGRAPHY HEAD TECHNIQUE: Multidetector CT imaging of the head was performed using the standard protocol during bolus administration of intravenous contrast. Multiplanar CT image reconstructions and MIPs were obtained to evaluate the vascular anatomy. CONTRAST:  15m ISOVUE-370 IOPAMIDOL (ISOVUE-370) INJECTION 76% COMPARISON:  MRI head MRA head 01/24/2019 FINDINGS: CT HEAD Brain: Mild to moderate atrophy without hydrocephalus. Negative for acute infarct, hemorrhage, mass. No fluid collection or midline shift. Vascular: Atherosclerotic calcification in the cavernous carotid bilaterally. Negative for hyperdense vessel Skull: Negative Sinuses: Paranasal sinuses clear.  Bilateral cataract extraction. Orbits: Bilateral cataract extraction. CTA HEAD Anterior circulation: Atherosclerotic calcification throughout the cavernous carotid bilaterally. Moderate stenosis supraclinoid internal carotid  artery on the right. Mild cavernous and supraclinoid internal carotid artery stenosis on the left. Anterior cerebral arteries patent bilaterally. Right M1 patent. Right MCA branches patent Left M1 patent. Short segment occlusion of the superior left M2 branch with distal flow. This is unchanged from the prior MRA. Posterior circulation: Both vertebral arteries patent to the basilar. PICA patent. Mild atherosclerotic stenosis proximal basilar. Basilar is otherwise patent. Superior cerebellar arteries patent bilaterally. Both posterior cerebral arteries are patent. Moderate atherosclerotic irregularity and stenosis left P2 segment. Mild stenosis right P2 segment. Venous sinuses: Normal venous enhancement Anatomic variants: None IMPRESSION: 1. No acute intracranial abnormality. Previously noted small infarcts are not identified by CT. 2. 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 Moderate stenosis on the right and mild stenosis on the left 3. Short segmental occlusion left M2 branch unchanged. 4. Moderate stenosis left P2 and mild stenosis right P2 Electronically Signed   By: CFranchot GalloM.D.   On: 04/22/2019 15:55    Labs:  CBC: Recent Labs    01/24/19 0346 01/24/19 1304 01/25/19 0413 01/26/19 0402 01/27/19 0100 01/28/19 0927  WBC 7.6  --  8.7 7.6  --  7.9  HGB 6.9*   < > 8.1* 7.3* 8.2* 9.0*  HCT 20.6*   < > 23.8* 22.4* 24.6* 26.7*  PLT 97*  --  98* 102*  --  143*   < > = values in this interval not displayed.    COAGS: Recent Labs    11/07/18 1314 11/29/18 1625 01/18/19 0956 01/22/19 0728 01/24/19 0346  INR 2.1* 2.0*  --  1.1  --   APTT  --   --  42*  --  73*    BMP: Recent Labs    01/24/19 0346 01/25/19 0413 01/26/19 0402 01/28/19 0927  NA 138 138 137 132*  K 3.9 3.8 3.9 4.1  CL 111 108 106 99  CO2 20* _0 GLUCOSE 154* 132* 135* 252*  BUN 26* _1 CALCIUM 8.0* 8.1* 8.0* 8.3*  CREATININE 0.93 0.72 0.87 0.75  GFRNONAA 58* >60 >60 >  60   GFRAA >60 >60 >60 >60    LIVER FUNCTION TESTS: Recent Labs    11/07/18 1314 01/18/19 0956  BILITOT 0.4 0.6  AST 26 24  ALT 22 21  ALKPHOS 55 58  PROT 6.2* 6.6  ALBUMIN 3.6 4.0    TUMOR MARKERS: No results for input(s): AFPTM, CEA, CA199, CHROMGRNA in the last 8760 hours.  Assessment and Plan: Intra-cranial stenosis Patient presents today accompanied by her daughter to discuss her imaging results after her recent stroke as well as suggestion on her MRI of left MCA, and moderate to severe L PCA stenosis.   CTA Head 04/22/19 showed: 1. No acute intracranial abnormality. Previously noted small infarcts are not identified by CT. 2. 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 Moderate stenosis on the right and mild stenosis on the left 3. Short segmental occlusion left M2 branch unchanged. 4. Moderate stenosis left P2 and mild stenosis right P2  She was recently evaluated by Neurology for follow-up her strokes. She was instructed to continue aspirin 34m daily and Xarelto.  Dr. DEstanislado Pandymet with the patient and her daughter in consultation today.  He discussed diagnostic angiogram in detail with special consideration for her recent groin hematoma as well as her need to remain on anticoagulation without interruption, if possible.  Risks and benefits were discussed.  Family and patient would like to move forward. Plan made to discuss holding anticoagulation with her neurologist. Could consider bridging with lovenox prior to procedure.  They understand that schedulers will contact them with date and time of procedure appointment with any necessary instructions re: blood thinners.  She should continue all her usual medications for now.    Thank you for this interesting consult.  I greatly enjoyed meeting PVeneta SliterCrotts and look forward to participating in their care.  A copy of this report was sent to the requesting provider on this date.  Electronically  Signed: KDocia Barrier PA 05/09/2019, 2:25 PM   I spent a total of  30 Minutes   in face to face in clinical consultation, greater than 50% of which was counseling/coordinating care for intra-cranial stenosis.

## 2019-05-14 ENCOUNTER — Telehealth (HOSPITAL_COMMUNITY): Payer: Self-pay

## 2019-05-14 NOTE — Telephone Encounter (Signed)
Daughter will talk with sisters and call back to schedule angiogram. AW

## 2019-05-15 ENCOUNTER — Telehealth: Payer: Self-pay | Admitting: Student

## 2019-05-15 NOTE — Telephone Encounter (Signed)
NIR.  Patient scheduled for an image-guided diagnostic cerebral arteriogram tentatively for 05/21/2019 in IR with Dr. Estanislado Pandy. Patient currently taking Xarelto 15 mg once daily for atrial fibrillation. Patient will need to hold this 24 hours prior to procedure, she will be bridged with Lovenox throughout hold.  Bridge instructions: - Patient to take last dose Xarelto 05/19/2019. - Patient to take Lovenox 60 mg SQ injections BID 05/20/2019. - Patient to take Lovenox 60 mg SQ injection in AM 05/21/2019. - Patient to restart Xarelto 05/22/2019 AM.  Prescription called into [id number] at 1134- Lovenox 60 mg injections, inject 60 mg SQ BID for 3 doses, dispense 3 syringes with 0 refills.  Called patients daughter, [name], at 54 to discuss above. All questions answered and concerns addressed.  Please call NIR with questions/concerns.   Bea Graff Attie Nawabi, PA-C 05/15/2019, 2:08 PM

## 2019-05-16 ENCOUNTER — Other Ambulatory Visit: Payer: Self-pay | Admitting: Radiology

## 2019-05-17 ENCOUNTER — Encounter: Payer: Self-pay | Admitting: Adult Health

## 2019-05-20 DIAGNOSIS — E041 Nontoxic single thyroid nodule: Secondary | ICD-10-CM | POA: Diagnosis not present

## 2019-05-21 ENCOUNTER — Ambulatory Visit (HOSPITAL_COMMUNITY)
Admission: RE | Admit: 2019-05-21 | Discharge: 2019-05-21 | Disposition: A | Discharge status: Home / Self Care | Payer: Medicare Other | Source: Ambulatory Visit | Attending: Interventional Radiology | Admitting: Interventional Radiology

## 2019-05-21 ENCOUNTER — Other Ambulatory Visit (HOSPITAL_COMMUNITY): Payer: Self-pay | Admitting: Interventional Radiology

## 2019-05-21 ENCOUNTER — Other Ambulatory Visit: Payer: Self-pay

## 2019-05-21 ENCOUNTER — Encounter (HOSPITAL_COMMUNITY): Payer: Self-pay

## 2019-05-21 DIAGNOSIS — Z952 Presence of prosthetic heart valve: Secondary | ICD-10-CM | POA: Insufficient documentation

## 2019-05-21 DIAGNOSIS — Z79899 Other long term (current) drug therapy: Secondary | ICD-10-CM | POA: Diagnosis not present

## 2019-05-21 DIAGNOSIS — E119 Type 2 diabetes mellitus without complications: Secondary | ICD-10-CM | POA: Diagnosis not present

## 2019-05-21 DIAGNOSIS — F419 Anxiety disorder, unspecified: Secondary | ICD-10-CM | POA: Diagnosis not present

## 2019-05-21 DIAGNOSIS — I6622 Occlusion and stenosis of left posterior cerebral artery: Secondary | ICD-10-CM | POA: Diagnosis not present

## 2019-05-21 DIAGNOSIS — I651 Occlusion and stenosis of basilar artery: Secondary | ICD-10-CM | POA: Diagnosis not present

## 2019-05-21 DIAGNOSIS — I771 Stricture of artery: Secondary | ICD-10-CM

## 2019-05-21 DIAGNOSIS — Z8673 Personal history of transient ischemic attack (TIA), and cerebral infarction without residual deficits: Secondary | ICD-10-CM | POA: Insufficient documentation

## 2019-05-21 DIAGNOSIS — I4819 Other persistent atrial fibrillation: Secondary | ICD-10-CM | POA: Diagnosis not present

## 2019-05-21 DIAGNOSIS — G450 Vertebro-basilar artery syndrome: Secondary | ICD-10-CM | POA: Diagnosis not present

## 2019-05-21 DIAGNOSIS — I10 Essential (primary) hypertension: Secondary | ICD-10-CM | POA: Insufficient documentation

## 2019-05-21 DIAGNOSIS — Z7982 Long term (current) use of aspirin: Secondary | ICD-10-CM | POA: Insufficient documentation

## 2019-05-21 DIAGNOSIS — Z7901 Long term (current) use of anticoagulants: Secondary | ICD-10-CM | POA: Diagnosis not present

## 2019-05-21 DIAGNOSIS — K219 Gastro-esophageal reflux disease without esophagitis: Secondary | ICD-10-CM | POA: Insufficient documentation

## 2019-05-21 DIAGNOSIS — Z794 Long term (current) use of insulin: Secondary | ICD-10-CM | POA: Diagnosis not present

## 2019-05-21 DIAGNOSIS — I6603 Occlusion and stenosis of bilateral middle cerebral arteries: Secondary | ICD-10-CM | POA: Diagnosis not present

## 2019-05-21 HISTORY — PX: IR ANGIO INTRA EXTRACRAN SEL COM CAROTID INNOMINATE BILAT MOD SED: IMG5360

## 2019-05-21 HISTORY — PX: IR ANGIO VERTEBRAL SEL VERTEBRAL BILAT MOD SED: IMG5369

## 2019-05-21 LAB — CBC
HCT: 38.9 % (ref 36.0–46.0)
Hemoglobin: 13 g/dL (ref 12.0–15.0)
MCH: 30.9 pg (ref 26.0–34.0)
MCHC: 33.4 g/dL (ref 30.0–36.0)
MCV: 92.4 fL (ref 80.0–100.0)
Platelets: 180 10*3/uL (ref 150–400)
RBC: 4.21 MIL/uL (ref 3.87–5.11)
RDW: 12.4 % (ref 11.5–15.5)
WBC: 6.5 10*3/uL (ref 4.0–10.5)
nRBC: 0 % (ref 0.0–0.2)

## 2019-05-21 LAB — POCT I-STAT, CHEM 8
BUN: 34 mg/dL — ABNORMAL HIGH (ref 8–23)
Calcium, Ion: 1.28 mmol/L (ref 1.15–1.40)
Chloride: 102 mmol/L (ref 98–111)
Creatinine, Ser: 0.9 mg/dL (ref 0.44–1.00)
Glucose, Bld: 144 mg/dL — ABNORMAL HIGH (ref 70–99)
HCT: 39 % (ref 36.0–46.0)
Hemoglobin: 13.3 g/dL (ref 12.0–15.0)
Potassium: 4.8 mmol/L (ref 3.5–5.1)
Sodium: 139 mmol/L (ref 135–145)
TCO2: 28 mmol/L (ref 22–32)

## 2019-05-21 LAB — GLUCOSE, CAPILLARY
Glucose-Capillary: 160 mg/dL — ABNORMAL HIGH (ref 70–99)
Glucose-Capillary: 89 mg/dL (ref 70–99)

## 2019-05-21 LAB — PROTIME-INR
INR: 1.1 (ref 0.8–1.2)
Prothrombin Time: 14 seconds (ref 11.4–15.2)

## 2019-05-21 LAB — APTT: aPTT: 44 seconds — ABNORMAL HIGH (ref 24–36)

## 2019-05-21 MED ORDER — MIDAZOLAM HCL 2 MG/2ML IJ SOLN
INTRAMUSCULAR | Status: AC | PRN
  Administered 2019-05-21: 0.5 mg via INTRAVENOUS
  Administered 2019-05-21: 1 mg via INTRAVENOUS

## 2019-05-21 MED ORDER — LIDOCAINE HCL (PF) 1 % IJ SOLN
INTRAMUSCULAR | Status: AC | PRN
  Administered 2019-05-21: 10 mL

## 2019-05-21 MED ORDER — LIDOCAINE HCL 1 % IJ SOLN
INTRAMUSCULAR | Status: AC
  Filled 2019-05-21: qty 20

## 2019-05-21 MED ORDER — HEPARIN SODIUM (PORCINE) 1000 UNIT/ML IJ SOLN
INTRAMUSCULAR | Status: AC | PRN
  Administered 2019-05-21: 1000 [IU] via INTRAVENOUS

## 2019-05-21 MED ORDER — FENTANYL CITRATE (PF) 100 MCG/2ML IJ SOLN
INTRAMUSCULAR | Status: AC | PRN
  Administered 2019-05-21: 12.5 ug via INTRAVENOUS
  Administered 2019-05-21: 25 ug via INTRAVENOUS

## 2019-05-21 MED ORDER — FENTANYL CITRATE (PF) 100 MCG/2ML IJ SOLN
INTRAMUSCULAR | Status: AC
  Filled 2019-05-21: qty 2

## 2019-05-21 MED ORDER — HEPARIN SODIUM (PORCINE) 1000 UNIT/ML IJ SOLN
INTRAMUSCULAR | Status: AC
  Filled 2019-05-21: qty 1

## 2019-05-21 MED ORDER — MIDAZOLAM HCL 2 MG/2ML IJ SOLN
INTRAMUSCULAR | Status: AC
  Filled 2019-05-21: qty 2

## 2019-05-21 MED ORDER — IOHEXOL 300 MG/ML  SOLN
50.0000 mL | Freq: Once | INTRAMUSCULAR | Status: AC | PRN
  Administered 2019-05-21: 24 mL via INTRA_ARTERIAL

## 2019-05-21 MED ORDER — SODIUM CHLORIDE 0.9 % IV SOLN: Freq: Once | INTRAVENOUS | Status: DC

## 2019-05-21 MED ORDER — SODIUM CHLORIDE 0.9 % IV SOLN: INTRAVENOUS | Status: AC

## 2019-05-21 MED ORDER — IOHEXOL 300 MG/ML  SOLN
150.0000 mL | Freq: Once | INTRAMUSCULAR | Status: AC | PRN
  Administered 2019-05-21: 60 mL via INTRA_ARTERIAL

## 2019-05-21 NOTE — Discharge Instructions (Signed)
Angiogram, Care After This sheet gives you information about how to care for yourself after your procedure. Your health care provider may also give you more specific instructions. If you have problems or questions, contact your health care provider. What can I expect after the procedure? After the procedure, it is common to have bruising and tenderness at the catheter insertion area. Follow these instructions at home: Insertion site care  Follow instructions from your health care provider about how to take care of your insertion site. Make sure you: ? Wash your hands with soap and water before you change your bandage (dressing). If soap and water are not available, use hand sanitizer. ? Change your dressing as told by your health care provider. ? Leave stitches (sutures), skin glue, or adhesive strips in place. These skin closures may need to stay in place for 2 weeks or longer. If adhesive strip edges start to loosen and curl up, you may trim the loose edges. Do not remove adhesive strips completely unless your health care provider tells you to do that.  Do not take baths, swim, or use a hot tub until your health care provider approves.  You may shower 24-48 hours after the procedure or as told by your health care provider. ? Gently wash the site with plain soap and water. ? Pat the area dry with a clean towel. ? Do not rub the site. This may cause bleeding.  Do not apply powder or lotion to the site. Keep the site clean and dry.  Check your insertion site every day for signs of infection. Check for: ? Redness, swelling, or pain. ? Fluid or blood. ? Warmth. ? Pus or a bad smell. Activity  Rest as told by your health care provider, usually for 1-2 days.  Do not lift anything that is heavier than 10 lbs. (4.5 kg) or as told by your health care provider.  Do not drive for 24 hours if you were given a medicine to help you relax (sedative).  Do not drive or use heavy machinery while  taking prescription pain medicine. General instructions   Return to your normal activities as told by your health care provider, usually in about a week. Ask your health care provider what activities are safe for you.  If the catheter site starts bleeding, lie flat and put pressure on the site. If the bleeding does not stop, get help right away. This is a medical emergency.  Drink enough fluid to keep your urine clear or pale yellow. This helps flush the contrast dye from your body.  Take over-the-counter and prescription medicines only as told by your health care provider.  Keep all follow-up visits as told by your health care provider. This is important. Contact a health care provider if:  You have a fever or chills.  You have redness, swelling, or pain around your insertion site.  You have fluid or blood coming from your insertion site.  The insertion site feels warm to the touch.  You have pus or a bad smell coming from your insertion site.  You have bruising around the insertion site.  You notice blood collecting in the tissue around the catheter site (hematoma). The hematoma may be painful to the touch. Get help right away if:  You have severe pain at the catheter insertion area.  The catheter insertion area swells very fast.  The catheter insertion area is bleeding, and the bleeding does not stop when you hold steady pressure on the area.    The area near or just beyond the catheter insertion site becomes pale, cool, tingly, or numb. These symptoms may represent a serious problem that is an emergency. Do not wait to see if the symptoms will go away. Get medical help right away. Call your local emergency services (911 in the [place] Do not drive yourself to the hospital. Summary  After the procedure, it is common to have bruising and tenderness at the catheter insertion area.  After the procedure, it is important to rest and drink plenty of fluids.  Do not take baths,  swim, or use a hot tub until your health care provider says it is okay to do so. You may shower 24-48 hours after the procedure or as told by your health care provider.  If the catheter site starts bleeding, lie flat and put pressure on the site. If the bleeding does not stop, get help right away. This is a medical emergency. This information is not intended to replace advice given to you by your health care provider. Make sure you discuss any questions you have with your health care provider. Document Revised: 02/24/2017 Document Reviewed: 02/17/2016 Elsevier Patient Education  2020 [employer]  

## 2019-05-21 NOTE — H&P (Signed)
Chief Complaint: Patient was seen in consultation today for cerebral arteriogram at the request of Dr Royal Hawthorn  Supervising Physician: Luanne Bras  Patient Status: [facility] - Out-pt  History of Present Illness: Rachel Santos is a 79 y.o. 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 Multifocal acute ischemia scattered within both hemispheres, left-greater-than-right, and in the left cerebellum. The pattern is most consistent with a central embolic source, particularly in a patient with a history of atrial fibrillation. 2. No emergent large vessel occlusion or high-grade stenosis. 3. Mild intracranial atherosclerosis.   Referred to Dr Estanislado Pandy after recovered from CVA and Rt groin surgery for evaluation and management of stenosis  CTA 04/22/19: IMPRESSION: 1. No acute intracranial abnormality. Previously noted small infarcts are not identified by CT. 2. 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 Moderate stenosis on the right and mild stenosis on the left 3. Short segmental occlusion left M2 branch unchanged. 4. Moderate stenosis left P2 and mild stenosis right P2  Scheduled now for cerebral arteriogram per Dr Estanislado Pandy   Past Medical History:  Diagnosis Date  . Acute blood loss anemia, transfused 1 unit PRBC 01/26/19 01/27/2019  . Anxiety   . Arthritis    knees  . Atrial fibrillation ([place])   . Cataracts, bilateral   . Diabetes mellitus   . GERD (gastroesophageal reflux disease)   . History of TIA (transient ischemic attack)    mini stroke with vision problems  . Hypertension   . Mitral regurgitation   . S/P Maze operation for atrial  fibrillation 10/11/2012   Complete bilateral atrial lesion set using bipolar radiofrequency and cryothermy ablation with clipping of LA appendage  . 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   3mm Sorin Memo 3D ring annuloplasty with 26 mm Edwards mc3 tricuspid ring annuloplasty   . 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 Tricuspid regurgitation     Past Surgical History:  Procedure Laterality Date  . ARTERY REPAIR Right 01/22/2019   Procedure: Right Common Femoral Artery Repair;  Surgeon: Angelia Mould, MD;  Location: [place];  Service: Vascular;  Laterality: Right;  . CARDIAC CATHETERIZATION  09-05-12  . CARDIOVERSION N/A 07/31/2012   Procedure: CARDIOVERSION;  Surgeon: Josue Hector, MD;  Location: [place];  Service: Cardiovascular;  Laterality: N/A;  . CESAREAN SECTION     x 2  . COLONOSCOPY WITH PROPOFOL N/A 11/28/2014   Procedure: COLONOSCOPY WITH PROPOFOL;  Surgeon: Carol Ada, MD;  Location: [facility];  Service: Endoscopy;  Laterality: N/A;  . FEMORAL ARTERY EXPLORATION Right 01/22/2019   Procedure: RIGHT GROIN EXPLORATION with evacuation of hematoma;  Surgeon: Angelia Mould, MD;  Location: [facility];  Service: Vascular;  Laterality: Right;  . FLEXIBLE SIGMOIDOSCOPY N/A 12/18/2015   Procedure: FLEXIBLE SIGMOIDOSCOPY;  Surgeon: Carol Ada, MD;  Location: [facility];  Service: Endoscopy;  Laterality: N/A;  . INTRAOPERATIVE TRANSESOPHAGEAL ECHOCARDIOGRAM N/A 10/11/2012   Procedure: INTRAOPERATIVE TRANSESOPHAGEAL ECHOCARDIOGRAM;  Surgeon: Rexene Alberts, MD;  Location: [place];  Service: Open Heart Surgery;  Laterality: N/A;  . MASTECTOMY Left 1982  . MAZE N/A 10/11/2012  Procedure: MAZE;  Surgeon: Rexene Alberts, MD;  Location: [place];  Service: Open Heart Surgery;  Laterality: N/A;  . MITRAL VALVE REPAIR N/A 10/11/2012   Procedure: MITRAL VALVE REPAIR (MVR);  Surgeon: Rexene Alberts, MD;  Location: [place];  Service: Open Heart Surgery;  Laterality: N/A;  . RIGHT HEART CATH AND CORONARY ANGIOGRAPHY N/A 12/24/2018   Procedure: RIGHT HEART CATH AND CORONARY ANGIOGRAPHY;  Surgeon: [name], MD;  Location: [place] CV [facility];  Service: Cardiovascular;  Laterality: N/A;  . TEE WITHOUT CARDIOVERSION N/A 07/31/2012   Procedure: TRANSESOPHAGEAL ECHOCARDIOGRAM (TEE);  Surgeon: Josue Hector, MD;  Location: [place];  Service: Cardiovascular;  Laterality: N/A;  . TEE WITHOUT CARDIOVERSION N/A 01/22/2019   Procedure: TRANSESOPHAGEAL ECHOCARDIOGRAM (TEE);  Surgeon: [name], MD;  Location: [place] [facility];  Service: Open Heart Surgery;  Laterality: N/A;  . TRANSCATHETER AORTIC VALVE REPLACEMENT, TRANSFEMORAL N/A 01/22/2019   Procedure: TRANSCATHETER AORTIC VALVE REPLACEMENT, TRANSFEMORAL;  Surgeon: [name], MD;  Location: [place] [facility];  Service: Open Heart Surgery;  Laterality: N/A;  . TRICUSPID VALVE REPLACEMENT N/A 10/11/2012   Procedure: TRICUSPID VALVE REPAIR;  Surgeon: Rexene Alberts, MD;  Location: [place];  Service: Open Heart Surgery;  Laterality: N/A;  . TUBAL LIGATION      Allergies: Metformin and related, Ultram [tramadol], Nickel, and Pneumococcal vaccines  Medications: Prior to Admission medications   Medication Sig Start Date End Date Taking? Authorizing Provider  aspirin EC 81 MG tablet Take 81 mg by mouth daily.   Yes [provider]  atorvastatin (LIPITOR) 40 MG tablet Take 40 mg by mouth daily.   Yes [provider]  busPIRone (BUSPAR) 10 MG tablet Take 10 mg by mouth 2 (two) times daily.    Yes [provider]  Calcium Carb-Cholecalciferol (CALCIUM PLUS VITAMIN D3) 600-500 MG-UNIT CAPS Take 1 tablet by mouth 2 (two) times daily.   Yes [provider]  Cyanocobalamin (VITAMIN B-12 PO) Take 1 tablet by mouth daily.   Yes [provider]  ferrous sulfate 325 (65  FE) MG tablet Take 325 mg by mouth daily.   Yes [provider]  Insulin Detemir (LEVEMIR FLEXPEN) 100 UNIT/ML Pen Inject 32 Units into the skin daily.    Yes [provider]  magnesium hydroxide (MILK OF MAGNESIA) 400 MG/5ML suspension Take 30 mLs by mouth as needed.   Yes [provider]  Melatonin 5 MG CAPS Take 5 mg by mouth at bedtime as needed (sleep).   Yes [provider]  omeprazole (PRILOSEC) 40 MG capsule Take 40 mg by mouth daily before breakfast.  06/28/12  Yes [provider]  Semaglutide,0.25 or 0.5MG /DOS, (OZEMPIC, 0.25 OR 0.5 MG/DOSE,) 2 MG/1.5ML SOPN Inject 0.5 mg into the skin every Monday.    Yes [provider]  venlafaxine XR (EFFEXOR-XR) 150 MG 24 hr capsule Take 150 mg by mouth daily with breakfast. Take with 75 mg capsule to equal 225 mg daily   Yes [provider]  venlafaxine XR (EFFEXOR-XR) 75 MG 24 hr capsule Take 75 mg by mouth daily with breakfast. Take with 150 mg capsule to equal 225 mg daily   Yes [provider]  acetaminophen (TYLENOL) 500 MG tablet Take 1,000 mg by mouth every 6 (six) hours as needed for moderate pain or headache.    [provider]  Rivaroxaban (XARELTO) 15 MG TABS tablet Take 15 mg by mouth daily with supper.  [provider]     Family History  Problem Relation Age of Onset  . Cancer Father        prostate  . Hypertension Father   . Heart attack Paternal Grandfather   . Stroke Neg Hx     Social History   Socioeconomic History  . Marital status: Widowed    Spouse name: Not on file  . Number of children: 3  . Years of education: Not on file  . Highest education level: Not on file  Occupational History  . Occupation: Air cabin crew  Tobacco Use  . Smoking status: Never Smoker  . Smokeless tobacco: Never Used  Substance and Sexual Activity  . Alcohol use: No  . Drug use: No  . Sexual activity: Never  Other Topics  Concern  . Not on file  Social History Narrative  . Not on file   Social Determinants of Health   Financial Resource Strain:   . Difficulty of Paying Living Expenses: Not on file  Food Insecurity:   . Worried About Charity fundraiser in the Last Year: Not on file  . Ran Out of Food in the Last Year: Not on file  Transportation Needs:   . Lack of Transportation (Medical): Not on file  . Lack of Transportation (Non-Medical): Not on file  Physical Activity:   . Days of Exercise per Week: Not on file  . Minutes of Exercise per Session: Not on file  Stress:   . Feeling of Stress : Not on file  Social Connections:   . Frequency of Communication with Friends and Family: Not on file  . Frequency of Social Gatherings with Friends and Family: Not on file  . Attends Religious Services: Not on file  . Active Member of Clubs or Organizations: Not on file  . Attends Archivist Meetings: Not on file  . Marital Status: Not on file    Review of Systems: A 12 point ROS discussed and pertinent positives are indicated in the HPI above.  All other systems are negative.  Review of Systems  Constitutional: Negative for fatigue and fever.  HENT: Negative for tinnitus and trouble swallowing.   Eyes: Negative for visual disturbance.  Respiratory: Negative for cough and shortness of breath.   Cardiovascular: Negative for chest pain.  Gastrointestinal: Negative for abdominal pain.  Musculoskeletal: Positive for gait problem. Negative for back pain.  Neurological: Negative for dizziness, tremors, seizures, syncope, facial asymmetry, speech difficulty, weakness, light-headedness, numbness and headaches.  Psychiatric/Behavioral: Negative for behavioral problems and confusion.    Vital Signs: BP (!) 105/44   Pulse (!) 57   Temp 98.5 F (36.9 C) (Oral)   Resp 16   Ht 5\' 6"  (1.676 m)   Wt 145 lb (65.8 kg)   SpO2 100%   BMI 23.40 kg/m   Physical Exam Vitals reviewed.  HENT:      Mouth/Throat:     Mouth: Mucous membranes are moist.  Eyes:     Extraocular Movements: Extraocular movements intact.  Cardiovascular:     Rate and Rhythm: Normal rate and regular rhythm.     Heart sounds: Murmur present.  Pulmonary:     Effort: Pulmonary effort is normal.     Breath sounds: Normal breath sounds.  Abdominal:     Palpations: Abdomen is soft.  Musculoskeletal:        General: Normal range of motion.     Cervical back: Normal range of motion.  Skin:  General: Skin is warm and dry.  Neurological:     Mental Status: She is alert and oriented to person, place, and time.  Psychiatric:        Behavior: Behavior normal.        Thought Content: Thought content normal.        Judgment: Judgment normal.     Imaging: CT ANGIO HEAD W OR WO CONTRAST  Result Date: 04/22/2019 CLINICAL DATA:  Stenosis of intracranial portion of both internal carotid arteries. Surveillance. EXAM: CT ANGIOGRAPHY HEAD TECHNIQUE: Multidetector CT imaging of the head was performed using the standard protocol during bolus administration of intravenous contrast. Multiplanar CT image reconstructions and MIPs were obtained to evaluate the vascular anatomy. CONTRAST:  63mL ISOVUE-370 IOPAMIDOL (ISOVUE-370) INJECTION 76% COMPARISON:  MRI head MRA head 01/24/2019 FINDINGS: CT HEAD Brain: Mild to moderate atrophy without hydrocephalus. Negative for acute infarct, hemorrhage, mass. No fluid collection or midline shift. Vascular: Atherosclerotic calcification in the cavernous carotid bilaterally. Negative for hyperdense vessel Skull: Negative Sinuses: Paranasal sinuses clear.  Bilateral cataract extraction. Orbits: Bilateral cataract extraction. CTA HEAD Anterior circulation: Atherosclerotic calcification throughout the cavernous carotid bilaterally. Moderate stenosis supraclinoid internal carotid artery on the right. Mild cavernous and supraclinoid internal carotid artery stenosis on the left. Anterior cerebral  arteries patent bilaterally. Right M1 patent. Right MCA branches patent Left M1 patent. Short segment occlusion of the superior left M2 branch with distal flow. This is unchanged from the prior MRA. Posterior circulation: Both vertebral arteries patent to the basilar. PICA patent. Mild atherosclerotic stenosis proximal basilar. Basilar is otherwise patent. Superior cerebellar arteries patent bilaterally. Both posterior cerebral arteries are patent. Moderate atherosclerotic irregularity and stenosis left P2 segment. Mild stenosis right P2 segment. Venous sinuses: Normal venous enhancement Anatomic variants: None IMPRESSION: 1. No acute intracranial abnormality. Previously noted small infarcts are not identified by CT. 2. 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 Moderate stenosis on the right and mild stenosis on the left 3. Short segmental occlusion left M2 branch unchanged. 4. Moderate stenosis left P2 and mild stenosis right P2 Electronically Signed   By: Franchot Gallo M.D.   On: 04/22/2019 15:55    Labs:  CBC: Recent Labs    01/24/19 0346 01/24/19 1304 01/25/19 0413 01/26/19 0402 01/27/19 0100 01/28/19 0927  WBC 7.6  --  8.7 7.6  --  7.9  HGB 6.9*   < > 8.1* 7.3* 8.2* 9.0*  HCT 20.6*   < > 23.8* 22.4* 24.6* 26.7*  PLT 97*  --  98* 102*  --  143*   < > = values in this interval not displayed.    COAGS: Recent Labs    11/07/18 1314 11/29/18 1625 01/18/19 0956 01/22/19 0728 01/24/19 0346  INR 2.1* 2.0*  --  1.1  --   APTT  --   --  42*  --  73*    BMP: Recent Labs    01/24/19 0346 01/25/19 0413 01/26/19 0402 01/28/19 0927  NA 138 138 137 132*  K 3.9 3.8 3.9 4.1  CL 111 108 106 99  CO2 20* 23 24 23   GLUCOSE 154* 132* 135* 252*  BUN 26* 13 13 16   CALCIUM 8.0* 8.1* 8.0* 8.3*  CREATININE 0.93 0.72 0.87 0.75  GFRNONAA 58* >60 >60 >60  GFRAA >60 >60 >60 >60    LIVER FUNCTION TESTS: Recent Labs    11/07/18 1314 01/18/19 0956  BILITOT 0.4 0.6   AST 26 24  ALT 22 21  ALKPHOS  55 58  PROT 6.2* 6.6  ALBUMIN 3.6 4.0    TUMOR MARKERS: No results for input(s): AFPTM, CEA, CA199, CHROMGRNA in the last 8760 hours.  Assessment and Plan:  Hx CVA 12/2018 Bilat ICA stenosis For cerebral arteriogram for further evaluation today Risks and benefits of cerebral angiogram were discussed with the patient including, but not limited to bleeding, infection, vascular injury, contrast induced renal failure, stroke or even death.  This interventional procedure involves the use of X-rays and because of the nature of the planned procedure, it is possible that we will have prolonged use of X-ray fluoroscopy.  Potential radiation risks to you include (but are not limited to) the following: - A slightly elevated risk for cancer  several years later in life. This risk is typically less than 0.5% percent. This risk is low in comparison to the normal incidence of human cancer, which is 33% for women and 50% for men according to the [place]. - Radiation induced injury can include skin redness, resembling a rash, tissue breakdown / ulcers and hair loss (which can be temporary or permanent).   The likelihood of either of these occurring depends on the difficulty of the procedure and whether you are sensitive to radiation due to previous procedures, disease, or genetic conditions.   IF your procedure requires a prolonged use of radiation, you will be notified and given written instructions for further action.  It is your responsibility to monitor the irradiated area for the 2 weeks following the procedure and to notify your physician if you are concerned that you have suffered a radiation induced injury.    All of the patient's questions were answered, patient is agreeable to proceed.  Consent signed and in chart.  Thank you for this interesting consult.  I greatly enjoyed meeting Rachel Santos and look forward to participating in their  care.  A copy of this report was sent to the requesting provider on this date.  Electronically Signed: Lavonia Drafts, PA-C 05/21/2019, 9:32 AM   I spent a total of    25 Minutes in face to face in clinical consultation, greater than 50% of which was counseling/coordinating care for cerebral arteriogram

## 2019-05-21 NOTE — Procedures (Addendum)
S/P 4 vessel cerebral arteriogram Lt CFA approach. Findings. 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 3. Approx 30 % stenosis of RT ICA distal  cav seg. 4. Severe stenosis of the prominent parietal branch of the inf division of the Lt MCA. S.Krithik Mapel MD

## 2019-05-22 ENCOUNTER — Other Ambulatory Visit (HOSPITAL_COMMUNITY): Payer: Self-pay | Admitting: Interventional Radiology

## 2019-05-22 DIAGNOSIS — I771 Stricture of artery: Secondary | ICD-10-CM

## 2019-05-23 ENCOUNTER — Other Ambulatory Visit: Payer: Self-pay

## 2019-05-23 ENCOUNTER — Ambulatory Visit (HOSPITAL_COMMUNITY)
Admission: RE | Admit: 2019-05-23 | Discharge: 2019-05-23 | Disposition: A | Discharge status: Home / Self Care | Payer: Medicare Other | Source: Ambulatory Visit | Attending: Interventional Radiology | Admitting: Interventional Radiology

## 2019-05-23 DIAGNOSIS — I771 Stricture of artery: Secondary | ICD-10-CM

## 2019-05-23 DIAGNOSIS — I6622 Occlusion and stenosis of left posterior cerebral artery: Secondary | ICD-10-CM | POA: Diagnosis not present

## 2019-05-23 NOTE — Consult Note (Signed)
Chief Complaint: Patient was seen today for follow up of diagnostic cerebral angiogram performed 05/21/19 with Dr. Estanislado Pandy  Referring Physician(s): Antony Contras, MD  Supervising Physician: Luanne Bras  Patient Status: [facility] - Out-pt  History of Present Illness: Rachel Santos is a 79 y.o. 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 Rachel Santos underwent TAVR on 01/22/19 for which her Xarelto was held, she was noted to have a large right groin hematoma post procedure which required surgical exploration. Unfortunately during the time her Xarelto was held she began to have difficulty with word finding and disorientation on 10/28. She underwent MRA early on 10/29 which noted multifocal acute ischemia scattered within both hemispheres, L>R, and in the left cerebellum - most consistent with central embolic source; no emergent large vessel occlusion or high-grade stenosis. She was discharged on 11/2 and was seen by neurology as an outpatient. 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 She was seen in consultation for a diagnostic angiogram on 05/09/19 and decision was made to proceed with the procedure which occurred on 05/21/19. 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 She presents today to discuss these results and  possible intervention.  Rachel Santos was seen today with Dr. Estanislado Pandy and her daughter Rachel Santos who provides much of the history today. Rachel Santos states that she "felt bad last night and I threw up this morning" and that her hands were swollen earlier today but that has improved. Her daughter states that she continues to occasionally have issue with memory but has not noticed another episode of garbled speech like she had in November of this year which prompted her to bring her mom to [facility] where she had imaging and blood transfusions. They both state that she continues to have issue with dizziness and light headedness when standing, she has not had any recent syncopal events or falls. She does have issues with orthostatic hypotension and when she checks her BP at home it is usually 110-120/50-70 mmHg. They are concerned about the possibility of another stroke as they know she has some stenosis.   Past Medical History:  Diagnosis Date   Acute blood loss anemia, transfused 1 unit PRBC 01/26/19 01/27/2019   Anxiety    Arthritis    knees   Atrial fibrillation (HCC)    Cataracts, bilateral    Diabetes mellitus    GERD (gastroesophageal reflux disease)    History of TIA (transient ischemic attack)    mini stroke with vision problems   Hypertension    Mitral regurgitation    S/P Maze operation for atrial fibrillation 10/11/2012   Complete bilateral atrial lesion set using bipolar radiofrequency and cryothermy ablation with clipping of LA appendage   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   38mm Sorin Memo 3D ring annuloplasty with 26 mm Edwards mc3 tricuspid ring annuloplasty    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   s/p TAVR with  a 23 Edwards Sapien 3 Ultra THV via the TF approach   Tricuspid regurgitation     Past Surgical History:  Procedure Laterality Date   ARTERY REPAIR Right 01/22/2019   Procedure: Right Common Femoral Artery Repair;  Surgeon: Angelia Mould, MD;  Location: [place];  Service: Vascular;  Laterality: Right;   CARDIAC CATHETERIZATION  09-05-12   CARDIOVERSION N/A 07/31/2012   Procedure: CARDIOVERSION;  Surgeon: Josue Hector, MD;  Location: [place];  Service: Cardiovascular;  Laterality: N/A;   CESAREAN SECTION     x 2   COLONOSCOPY WITH PROPOFOL N/A 11/28/2014   Procedure: COLONOSCOPY WITH PROPOFOL;  Surgeon: Carol Ada, MD;  Location: [facility];  Service: Endoscopy;  Laterality: N/A;   FEMORAL ARTERY EXPLORATION Right 01/22/2019   Procedure: RIGHT GROIN EXPLORATION with evacuation of hematoma;  Surgeon: Angelia Mould, MD;  Location: [place];  Service: Vascular;  Laterality: Right;   FLEXIBLE SIGMOIDOSCOPY N/A 12/18/2015   Procedure: FLEXIBLE SIGMOIDOSCOPY;  Surgeon: Carol Ada, MD;  Location: [facility];  Service: Endoscopy;  Laterality: N/A;   INTRAOPERATIVE TRANSESOPHAGEAL ECHOCARDIOGRAM N/A 10/11/2012   Procedure: INTRAOPERATIVE TRANSESOPHAGEAL ECHOCARDIOGRAM;  Surgeon: Rexene Alberts, MD;  Location: [place];  Service: Open Heart Surgery;  Laterality: N/A;   IR ANGIO INTRA EXTRACRAN SEL COM CAROTID INNOMINATE BILAT MOD SED  05/21/2019   MASTECTOMY Left 1982   MAZE N/A 10/11/2012   Procedure: MAZE;  Surgeon: Rexene Alberts, MD;  Location: [place];  Service: Open Heart Surgery;  Laterality: N/A;   MITRAL VALVE REPAIR N/A 10/11/2012   Procedure: MITRAL VALVE REPAIR (MVR);  Surgeon: Rexene Alberts, MD;  Location: [place];  Service: Open Heart Surgery;  Laterality: N/A;   RIGHT HEART CATH AND CORONARY ANGIOGRAPHY N/A 12/24/2018   Procedure: RIGHT HEART CATH AND CORONARY ANGIOGRAPHY;  Surgeon: [name], MD;  Location: [place] [facility];  Service: Cardiovascular;  Laterality: N/A;   TEE WITHOUT CARDIOVERSION N/A 07/31/2012   Procedure: TRANSESOPHAGEAL ECHOCARDIOGRAM (TEE);  Surgeon: Josue Hector, MD;  Location: [place];  Service: Cardiovascular;  Laterality: N/A;   TEE WITHOUT  CARDIOVERSION N/A 01/22/2019   Procedure: TRANSESOPHAGEAL ECHOCARDIOGRAM (TEE);  Surgeon: [name], MD;  Location: [place] [facility];  Service: Open Heart Surgery;  Laterality: N/A;   TRANSCATHETER AORTIC VALVE REPLACEMENT, TRANSFEMORAL N/A 01/22/2019   Procedure: TRANSCATHETER AORTIC VALVE REPLACEMENT, TRANSFEMORAL;  Surgeon: [name], MD;  Location: [place] [facility];  Service: Open Heart Surgery;  Laterality: N/A;   TRICUSPID VALVE REPLACEMENT N/A 10/11/2012   Procedure: TRICUSPID VALVE REPAIR;  Surgeon: Rexene Alberts, MD;  Location: [place];  Service: Open Heart Surgery;  Laterality: N/A;   TUBAL LIGATION      Allergies: Metformin and related, Ultram [tramadol], Nickel, and Pneumococcal vaccines  Medications: Prior to Admission medications   Medication Sig Start Date End Date Taking? Authorizing Provider  acetaminophen (TYLENOL) 500 MG tablet Take 1,000 mg by mouth every 6 (six) hours as needed for moderate pain or headache.    [provider]  aspirin EC 81 MG tablet Take 81 mg by mouth daily.    [provider]  atorvastatin (LIPITOR) 40 MG tablet Take 40 mg by mouth daily.    [provider]  busPIRone (BUSPAR) 10 MG tablet Take 10 mg by mouth 2 (two) times daily.     [provider]  Calcium Carb-Cholecalciferol (CALCIUM PLUS VITAMIN D3) 600-500 MG-UNIT CAPS Take 1 tablet by mouth  2 (two) times daily.    [provider]  Cyanocobalamin (VITAMIN B-12 PO) Take 1 tablet by mouth daily.    [provider]  ferrous sulfate 325 (65 FE) MG tablet Take 325 mg by mouth daily.    [provider]  Insulin Detemir (LEVEMIR FLEXPEN) 100 UNIT/ML Pen Inject 32 Units into the skin daily.     [provider]  magnesium hydroxide (MILK OF MAGNESIA) 400 MG/5ML suspension Take 30 mLs by mouth as needed.    [provider]  Melatonin 5 MG CAPS Take 5 mg by mouth at bedtime as needed (sleep).     [provider]  omeprazole (PRILOSEC) 40 MG capsule Take 40 mg by mouth daily before breakfast.  06/28/12   [provider]  Rivaroxaban (XARELTO) 15 MG TABS tablet Take 15 mg by mouth daily with supper.    [provider]  Semaglutide,0.25 or 0.5MG /DOS, (OZEMPIC, 0.25 OR 0.5 MG/DOSE,) 2 MG/1.5ML SOPN Inject 0.5 mg into the skin every Monday.     [provider]  venlafaxine XR (EFFEXOR-XR) 150 MG 24 hr capsule Take 150 mg by mouth daily with breakfast. Take with 75 mg capsule to equal 225 mg daily    [provider]  venlafaxine XR (EFFEXOR-XR) 75 MG 24 hr capsule Take 75 mg by mouth daily with breakfast. Take with 150 mg capsule to equal 225 mg daily    [provider]     Family History  Problem Relation Age of Onset   Cancer Father        prostate   Hypertension Father    Heart attack Paternal Grandfather    Stroke Neg Hx     Social History   Socioeconomic History   Marital status: Widowed    Spouse name: Not on file   Number of children: 3   Years of education: Not on file   Highest education level: Not on file  Occupational History   Occupation: Air cabin crew  Tobacco Use   Smoking status: Never Smoker   Smokeless tobacco: Never Used  Substance and Sexual Activity   Alcohol use: No   Drug use: No   Sexual activity: Never  Other Topics Concern   Not on file  Social History Narrative   Not on file   Social Determinants of Health   Financial Resource Strain:    Difficulty of Paying Living Expenses: Not on file  Food Insecurity:    Worried About [place] in the Last Year: Not on file   [employer] of Food in the Last Year: Not on file  Transportation Needs:    Lack of Transportation (Medical): Not on file   Lack of Transportation (Non-Medical): Not on file  Physical Activity:    Days of Exercise per Week: Not on file   Minutes of Exercise per Session: Not  on file  Stress:    Feeling of Stress : Not on file  Social Connections:    Frequency of Communication with Friends and Family: Not on file   Frequency of Social Gatherings with Friends and Family: Not on file   Attends Religious Services: Not on file   Active Member of Clubs or Organizations: Not on file   Attends Archivist Meetings: Not on file   Marital Status: Not on file     Review of Systems: A 12 point ROS discussed and pertinent positives are indicated in the HPI above.  All other systems are  negative.  Review of Systems  Constitutional: Negative for chills and fever.  Respiratory: Negative for cough and shortness of breath.   Cardiovascular: Negative for chest pain.  Gastrointestinal: Positive for vomiting (x1 this AM). Negative for abdominal pain, diarrhea and nausea.  Musculoskeletal: Positive for arthralgias.  Neurological: Positive for dizziness, speech difficulty ("sometimes mixes up words"), weakness, light-headedness, numbness (has neuropathy) and headaches. Negative for tremors, syncope and facial asymmetry.       (+) difficulty with short term memory    Vital Signs: There were no vitals taken for this visit.  Physical Exam Constitutional:      General: She is not in acute distress. HENT:     Head: Normocephalic.  Pulmonary:     Effort: Pulmonary effort is normal.  Skin:    General: Skin is warm and dry.  Neurological:     Mental Status: She is alert. Mental status is at baseline.     Comments: Oriented to person and "[place]" - states day of the week is Tuesday      Imaging: IR ANGIO INTRA EXTRACRAN SEL COM CAROTID INNOMINATE BILAT MOD SED  Result Date: 05/23/2019 CLINICAL DATA:  Vertebrobasilar insufficiency symptoms of dizziness, vertigo and memory issues. EXAM: BILATERAL COMMON CAROTID AND INNOMINATE ANGIOGRAPHY COMPARISON:  MRI MRA of the brain of January 24, 2019. MEDICATIONS: Heparin 1000 units IV. No antibiotic was  administered within 1 hour of the procedure. ANESTHESIA/SEDATION: Versed 1.5 mg IV; Fentanyl 37.5 mcg IV Moderate Sedation Time:  35 minutes The patient was continuously monitored during the procedure by the interventional radiology nurse under my direct supervision. CONTRAST:  Isovue 300 approximately 60 mL. FLUOROSCOPY TIME:  Fluoroscopy Time: 10 minutes 18 seconds (819 mGy). COMPLICATIONS: None immediate. TECHNIQUE: Informed written consent was obtained from the patient after a thorough discussion of the procedural risks, benefits and alternatives. All questions were addressed. Maximal Sterile Barrier Technique was utilized including caps, mask, sterile gowns, sterile gloves, sterile drape, hand hygiene and skin antiseptic. A timeout was performed prior to the initiation of the procedure. The left groin was prepped and draped in the usual sterile fashion. Thereafter using modified Seldinger technique, transfemoral access into the left common femoral artery was obtained without difficulty. Over a 0.035 inch guidewire, a 5 French Pinnacle sheath was inserted. Through this, and also over 0.035 inch guidewire, a 5 [place] Headhunter catheter was advanced to the aortic arch region and selectively positioned in the right common carotid artery, the right vertebral artery, the left common carotid artery and the left vertebral artery. FINDINGS: The innominate artery injection demonstrates mild-to-moderate atherosclerotic plaque involving the medial aspect of the innominate artery. The origins of the right common carotid artery and the right subclavian artery demonstrates wide patency. The right common carotid arteriogram demonstrates the right external carotid artery and its major branches to be widely patent. The right internal carotid artery at the bulb has a smooth shallow plaque along the medial aspect of the carotid bulb without associated ulcerations or of intraluminal filling defects. More distally, the right  internal carotid artery opacifies to the cranial skull base. The petrous, the cavernous and the supraclinoid segments are widely patent. There is mild 30% stenosis of the distal cavernous segment. The supraclinoid right ICA is widely patent. The right middle cerebral artery and the right anterior cerebral artery opacify into the capillary and venous phases. The venous phase demonstrates a prominent superiorly projecting outpouching from the right internal jugular bulb measuring 11.2 mm x 8.2 mm. The  origin of right vertebral artery is widely patent. The vessel is seen to opacify to the cranial skull base. Wide patency is seen of the right vertebrobasilar junction and the right posterior-inferior cerebellar artery. There mild to moderate narrowing of the right vertebrobasilar junction just distal to the origin of the right posterior-inferior cerebellar artery. The proximal basilar artery demonstrates approximately 40% stenosis secondary to an eccentric smooth atherosclerotic plaque involving the left lateral wall of the basilar artery. The basilar artery, the superior cerebellar arteries and the anterior-inferior cerebellar arteries opacify into the capillary and venous phases. The right posterior cerebral artery demonstrates approximately 50% stenosis in the distal P2 segment. The left posterior cerebral artery demonstrates a mild narrowing in its proximal P1 segment, with a more severe stenosis involving the P2 P3 junction. The origin of the left vertebral artery appears widely patent. The vessel demonstrates moderate tortuosity before ascending to the cranial skull base. The left posteroinferior cerebral artery and the left vertebrobasilar junction demonstrates wide patency. Again demonstrated is focal stenosis of 40% involving the proximal basilar artery. More distally, the basilar artery, the posterior cerebral arteries, the superior cerebellar arteries and the anterior-inferior cerebellar arteries opacify with  mixture of unopacified blood into the capillary and venous phases. The left common carotid arteriogram demonstrates moderate tortuosity just distal to its origin. The vessel, otherwise, opacifies to the common carotid bifurcation. The left external carotid artery and its major branches are widely patent. The left internal carotid artery at the bulb to the cranial skull base demonstrates wide patency. More distally, the left internal carotid artery opacifies to the cranial skull base. There is a mild focal narrowing associated with mild dilatation just distal to this involving distal internal cervical segment of the left internal carotid artery. More distally, the petrous, the cavernous and the supraclinoid segments are widely patent. The left posterior communicating artery is seen opacifying the left posterior cerebral artery distribution. The left middle cerebral artery and the left anterior cerebral artery opacify into the capillary and venous phases. The left middle cerebral artery demonstrates a codominant trifurcation. The middle branch of this trifurcation has a severe segmental narrowing with delayed filling of the vessel distally. IMPRESSION: Severe proximal segmental narrowing of the central codominant branch in the left MCA trifurcation most likely secondary to intracranial arteriosclerosis. Approximately 40% stenosis of the proximal basilar artery. Approximately 50% narrowing of the right posterior cerebral artery distal P2 segment. Severe focal narrowing of the left posterior cerebral P2 P3 junction. 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 PLAN: Above findings were reviewed with the patient and also her daughters. Follow-up consultation with the patient and daughters to discuss the above angiographic findings and further management. Electronically Signed   By: Luanne Bras M.D.   On: 05/21/2019 14:10     Labs:  CBC: Recent Labs    01/25/19 0413 01/25/19 0413 01/26/19 0402 01/26/19 0402 01/27/19 0100 01/28/19 0927 05/21/19 0846 05/21/19 1019  WBC 8.7  --  7.6  --   --  7.9 6.5  --   HGB 8.1*   < > 7.3*   < > 8.2* 9.0* 13.0 13.3  HCT 23.8*   < > 22.4*   < > 24.6* 26.7* 38.9 39.0  PLT 98*  --  102*  --   --  143* 180  --    < > = values in this interval not displayed.    COAGS: Recent  Labs    11/07/18 1314 11/29/18 1625 01/18/19 0956 01/22/19 0728 01/24/19 0346 05/21/19 1038  INR 2.1* 2.0*  --  1.1  --  1.1  APTT  --   --  42*  --  73* 44*    BMP: Recent Labs    01/24/19 0346 01/24/19 0346 01/25/19 0413 01/26/19 0402 01/28/19 0927 05/21/19 1019  NA 138   < > 138 137 132* 139  K 3.9   < > 3.8 3.9 4.1 4.8  CL 111   < > 108 106 99 102  CO2 20*  --  23 24 23   --   GLUCOSE 154*   < > 132* 135* 252* 144*  BUN 26*   < > 13 13 16  34*  CALCIUM 8.0*  --  8.1* 8.0* 8.3*  --   CREATININE 0.93   < > 0.72 0.87 0.75 0.90  GFRNONAA 58*  --  >60 >60 >60  --   GFRAA >60  --  >60 >60 >60  --    < > = values in this interval not displayed.    LIVER FUNCTION TESTS: Recent Labs    11/07/18 1314 01/18/19 0956  BILITOT 0.4 0.6  AST 26 24  ALT 22 21  ALKPHOS 55 58  PROT 6.2* 6.6  ALBUMIN 3.6 4.0    TUMOR MARKERS: No results for input(s): AFPTM, CEA, CA199, CHROMGRNA in the last 8760 hours.  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   Dr. Estanislado Pandy was present for consultation. Patient's daughter Rachel Santos was also present during exam. Discussed MCA diagram and locations of stenosis as well as  collateral vessels. We discussed that the area of 40% stenosis would not be treated at this time due to risk for rupture/dissection given it's moderate amount of stenosis. We discussed that treatment of the severe stenosis could be pursued however as this would be performed under general anesthesia she would need to be cleared by cardiology and anesthesiology prior to proceeding. Additionally she would need to begin Plavix in addition to ASA ~5 days prior to procedure and hold her Xarelto for at least 24 hours (and possibly bridge with Lovenox). Per Rachel Santos they are already scheduled to be seen cardiology on Monday.    Plan for follow-up:  - Maintain cardiology follow up and discuss procedure with their service to determine if patient could feasibly safely go under general anesthesia - Monitor for new or worsening neurologic symptoms - Monitor for worsening hypotension, light headedness, dizziness, etc. - Family to discuss possible intervention and will call us if they wish to proceed or if they have further questions. - Maintain DM control  All questions answered and concerns addressed.  Patient and her daughter Rachel Santos convey understanding and agree with plan.  Thank you for this interesting consult.  I greatly enjoyed meeting Donniesha Boitnott Horacek and look forward to participating in their care.  A copy of this report was sent to the requesting provider on this date.  Electronically Signed: Joaquim Nam, PA-C 05/23/2019, 9:28 AM   I spent a total of 40 Minutes in face to face in clinical consultation, greater  than 50% of which was counseling/coordinating care for diagnostic cerebral angiogram follow up.

## 2019-05-24 ENCOUNTER — Telehealth: Payer: Self-pay | Admitting: Student

## 2019-05-24 ENCOUNTER — Ambulatory Visit (HOSPITAL_COMMUNITY): Payer: Medicare Other

## 2019-05-24 NOTE — Telephone Encounter (Signed)
Daughter [name] called stating that her mother's dressing/gauze was still in place at her groin procedure site.  States she does not have care instructions.   Instructed daughter that gauze can be removed. No specific care or attention is needed to the site but to keep it clean and dry. She may see old, dried blood on the gauze if it has been in place for a few days.  If there is extreme tenderness, worsening bruising or fresh blood, then she should seek evaluation.   Daughter verbalizes understanding.  Appointment offered to assess if needed.  Daughter states she is not with her mother at the moment and will call us back if needed.  Instructed to seek emergency evaluation if any of the above mentioned concerns are present.    Brynda Greathouse, MS RD PA-C

## 2019-05-27 ENCOUNTER — Ambulatory Visit: Payer: Medicare Other | Admitting: Cardiovascular Disease

## 2019-05-27 ENCOUNTER — Encounter: Payer: Self-pay | Admitting: *Deleted

## 2019-05-27 ENCOUNTER — Other Ambulatory Visit: Payer: Self-pay

## 2019-05-27 ENCOUNTER — Encounter: Payer: Self-pay | Admitting: Cardiovascular Disease

## 2019-05-27 VITALS — BP 114/60 | HR 73 | Ht 66.0 in | Wt 145.8 lb

## 2019-05-27 DIAGNOSIS — I2510 Atherosclerotic heart disease of native coronary artery without angina pectoris: Secondary | ICD-10-CM

## 2019-05-27 DIAGNOSIS — Z9889 Other specified postprocedural states: Secondary | ICD-10-CM

## 2019-05-27 DIAGNOSIS — I639 Cerebral infarction, unspecified: Secondary | ICD-10-CM

## 2019-05-27 DIAGNOSIS — I10 Essential (primary) hypertension: Secondary | ICD-10-CM

## 2019-05-27 DIAGNOSIS — R002 Palpitations: Secondary | ICD-10-CM

## 2019-05-27 DIAGNOSIS — I480 Paroxysmal atrial fibrillation: Secondary | ICD-10-CM

## 2019-05-27 DIAGNOSIS — S301XXS Contusion of abdominal wall, sequela: Secondary | ICD-10-CM

## 2019-05-27 DIAGNOSIS — Z952 Presence of prosthetic heart valve: Secondary | ICD-10-CM

## 2019-05-27 DIAGNOSIS — R42 Dizziness and giddiness: Secondary | ICD-10-CM | POA: Diagnosis not present

## 2019-05-27 DIAGNOSIS — S3012XS Contusion of groin, sequela: Secondary | ICD-10-CM

## 2019-05-27 DIAGNOSIS — I350 Nonrheumatic aortic (valve) stenosis: Secondary | ICD-10-CM

## 2019-05-27 NOTE — Patient Instructions (Addendum)
Medication Instructions:  Your provider recommends that you continue on your current medications as directed. Please refer to the Current Medication list given to you today.   *If you need a refill on your cardiac medications before your next appointment, please call your pharmacy*  Lab Work: TODAY! CBC, TSH If you have labs (blood work) drawn today and your tests are completely normal, you will receive your results only by: Marland Kitchen MyChart Message (if you have MyChart) OR . A paper copy in the mail If you have any lab test that is abnormal or we need to change your treatment, we will call you to review the results.  Testing/Procedures: Your physician has recommended that you wear an event monitor for 2 weeks. Event monitors are medical devices that record the heart's electrical activity. Doctors most often [place] these monitors to diagnose arrhythmias. Arrhythmias are problems with the speed or rhythm of the heartbeat. The monitor is a small, portable device. You can wear one while you do your normal daily activities. This is usually used to diagnose what is causing palpitations/syncope (passing out).  Follow-Up: You are scheduled for a visit with Dr. Camillia Herter assistant, Ermalinda Barrios, on 07/16/19 at 12:15PM.    [employer] Cardiac Event Monitor Instructions Your physician has requested you wear your cardiac event monitor for 14 days, (1-30). [employer] may call or text to confirm a shipping address. The monitor will be sent to a land address via UPS. [employer] will not ship a monitor to a PO BOX. It typically takes 3-5 days to receive your monitor after it has been enrolled. [employer] will assist with USPS tracking if your package is delayed. The telephone number for [employer] is [phone]. Once you have received your monitor, please review the enclosed instructions. Instruction tutorials can also be viewed under help and settings on the enclosed cell phone. Your monitor has already been  registered assigning a specific monitor serial # to you.  Applying the monitor Remove cell phone from case and turn it on. The cell phone works as Dealer and needs to be within [employer] of you at all times. The cell phone will need to be charged on a daily basis. We recommend you plug the cell phone into the enclosed charger at your bedside table every night.  Monitor batteries: You will receive two monitor batteries labelled #1 and #2. These are your recorders. Plug battery #2 onto the second connection on the enclosed charger. Keep one battery on the charger at all times. This will keep the monitor battery deactivated. It will also keep it fully charged for when you need to switch your monitor batteries. A small light will be blinking on the battery emblem when it is charging. The light on the battery emblem will remain on when the battery is fully charged.  Open package of a Monitor strip. Insert battery #1 into black hood on strip and gently squeeze monitor battery onto connection as indicated in instruction booklet. Set aside while preparing skin.  Choose location for your strip, vertical or horizontal, as indicated in the instruction booklet. Shave to remove all hair from location. There cannot be any lotions, oils, powders, or colognes on skin where monitor is to be applied. Wipe skin clean with enclosed Saline wipe. Dry skin completely.  Peel paper labeled #1 off the back of the Monitor strip exposing the adhesive. Place the monitor on the chest in the vertical or horizontal position shown in the instruction booklet. One arrow on the monitor strip  must be pointing upward. Carefully remove paper labeled #2, attaching remainder of strip to your skin. Try not to create any folds or wrinkles in the strip as you apply it.  Firmly press and release the circle in the center of the monitor battery. You will hear a small beep. This is turning the monitor battery on. The heart  emblem on the monitor battery will light up every 5 seconds if the monitor battery in turned on and connected to the patient securely. Do not push and hold the circle down as this turns the monitor battery off. The cell phone will locate the monitor battery. A screen will appear on the cell phone checking the connection of your monitor strip. This may read poor connection initially but change to good connection within the next minute. Once your monitor accepts the connection you will hear a series of 3 beeps followed by a climbing crescendo of beeps. A screen will appear on the cell phone showing the two monitor strip placement options. Touch the picture that demonstrates where you applied the monitor strip.  Your monitor strip and battery are waterproof. You are able to shower, bathe, or swim with the monitor on. They just ask you do not submerge deeper than 3 feet underwater. We recommend removing the monitor if you are swimming in a lake, river, or ocean.  Your monitor battery will need to be switched to a fully charged monitor battery approximately once a week. The cell phone will alert you of an action which needs to be made.  On the cell phone, tap for details to reveal connection status, monitor battery status, and cell phone battery status. The green dots indicates your monitor is in good status. A red dot indicates there is something that needs your attention.  To record a symptom, click the circle on the monitor battery. In 30-60 seconds a list of symptoms will appear on the cell phone. Select your symptom and tap save. Your monitor will record a sustained or significant arrhythmia regardless of you clicking the button. Some patients do not feel the heart rhythm irregularities. [employer] will notify us of any serious or critical events.  Refer to instruction booklet for instructions on switching batteries, changing strips, the Do not disturb or Pause features, or any additional  questions.  Call [employer] at [phone], to confirm your monitor is transmitting and record your baseline. They will answer any questions you may have regarding the monitor instructions at that time.  Returning the monitor to [place] all equipment back into blue box. Peel off strip of paper to expose adhesive and close box securely. There is a prepaid UPS shipping label on this box. Drop in a UPS drop box, or at a UPS facility like [employer]. You may also contact [employer] to arrange UPS to pick up monitor package at your home.

## 2019-05-27 NOTE — Progress Notes (Signed)
Patient ID: Rachel Santos, female   DOB: 1940/05/21, 79 y.o.   MRN: [id number]  Patient enrolled for [employer] to ship a 14 day Cardiac event monitor to her home.  Instructions included in monitor kit and sent to patient via My Chart message.

## 2019-05-27 NOTE — Progress Notes (Signed)
[facility] Consult Note  Chief Complaint  Patient presents with  . 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 I saw her as a new patient for evaluation of atrial fibrillation and severe MR on 07/03/12. She was diagnosed with atrial fibrillation April of 2014 as well as severe MR and TR. LV function has always been normal. She had a TEE guided cardioversion in May 2016 with restoration of sinus rhythm. Her MR was noted to be central and severe. The LV cavity was not dilated. LV systolic function was normal. There was moderate to severe TR as well. She was felt to be symptomatic from her mitral valve disease. Cardiac cath on 09/05/12 with mild CAD. She underwent placement of mitral valve ring and tricuspid valve ring as well as MAZE procedure per Dr. Roxy Manns on 10/11/12. Post-operative course complicated by recurrence of atrial fibrillation with bradycardia. She was seen by Dr. Caryl Comes as an EP consult and pacemaker not recommended. She was seen in the ED at [facility] 11/07/18 after a syncopal episode. Echo 11/23/18 with LVEF over 65%. Moderate LVH. Stable mitral and tricuspid repair.Severe aortic stenosis noted. Cardiac cath September 2020 with mild to moderate non-obstructive CAD. She underwent successful TAVR with a 23 mm Edwards Sapien 3 THV via the TF approach on 01/22/19. Post operative echo showed EF 65-70%, normally functioning TAVR with mean gradient of 13 mm Hg and mild PVL. Unfortunately, her TAVR was complicated by an extensive right groin hematoma requiring surgical repair and blood transfusions as well as cardioembolic CVA. MRA showed multifocal acute ischemia scattered within both hemispheres, left-greater-than-right, and in the left  cerebellum most consistent with central embolic source. She was restarted on Xarelto. She was readmitted to [facility] from 11/9-11/12/20 with groin pain and recurrent aphagia. Her Hg was noted to be down to 7.4 and she was transfused. CT abdomen pelvis showed a right inguinal hematoma and small right pelvic hematoma. CT head showed no acute abnormality but MRI brain showed several small acute cortical-based infarcts in the left frontal, parietal and occipital lobes which may represent an embolic source.  CT angio of the head and neck showed severe narrowing of the left middle cerebral artery and moderate to severe narrowing of the left posterior cerebral artery.  She was evaluated by neurology and underwent full stroke work-up. Her stroke was felt to be most likely related to her intracranial atherosclerotic disease and a baby aspirin was added to her Xarelto.  She is here today for follow up. The patient denies any chest pain, lower extremity edema, orthopnea, PND, near syncope or syncope.   Primary Care Physician: Merrilee Seashore, MD  Past Medical History:  Diagnosis Date  . Acute blood loss anemia, transfused 1 unit PRBC 01/26/19 01/27/2019  . Anxiety   . Arthritis    knees  . Atrial fibrillation ([place])   . Cataracts, bilateral   . Diabetes mellitus   . GERD (gastroesophageal reflux disease)   . History of TIA (transient ischemic attack)    mini stroke with vision problems  . Hypertension   . Mitral regurgitation   . S/P Maze operation for atrial fibrillation 10/11/2012   Complete bilateral atrial lesion set using bipolar radiofrequency and cryothermy ablation with  clipping of LA appendage  . 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   34mm Sorin Memo 3D ring annuloplasty with 26 mm Edwards mc3 tricuspid ring annuloplasty   . 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 Tricuspid regurgitation     Past  Surgical History:  Procedure Laterality Date  . ARTERY REPAIR Right 01/22/2019   Procedure: Right Common Femoral Artery Repair;  Surgeon: Angelia Mould, MD;  Location: [place];  Service: Vascular;  Laterality: Right;  . CARDIAC CATHETERIZATION  09-05-12  . CARDIOVERSION N/A 07/31/2012   Procedure: CARDIOVERSION;  Surgeon: Josue Hector, MD;  Location: [place];  Service: Cardiovascular;  Laterality: N/A;  . CESAREAN SECTION     x 2  . COLONOSCOPY WITH PROPOFOL N/A 11/28/2014   Procedure: COLONOSCOPY WITH PROPOFOL;  Surgeon: Carol Ada, MD;  Location: [facility];  Service: Endoscopy;  Laterality: N/A;  . FEMORAL ARTERY EXPLORATION Right 01/22/2019   Procedure: RIGHT GROIN EXPLORATION with evacuation of hematoma;  Surgeon: Angelia Mould, MD;  Location: [facility] OR;  Service: Vascular;  Laterality: Right;  . FLEXIBLE SIGMOIDOSCOPY N/A 12/18/2015   Procedure: FLEXIBLE SIGMOIDOSCOPY;  Surgeon: Carol Ada, MD;  Location: [facility];  Service: Endoscopy;  Laterality: N/A;  . INTRAOPERATIVE TRANSESOPHAGEAL ECHOCARDIOGRAM N/A 10/11/2012   Procedure: INTRAOPERATIVE TRANSESOPHAGEAL ECHOCARDIOGRAM;  Surgeon: Rexene Alberts, MD;  Location: [place];  Service: Open Heart Surgery;  Laterality: N/A;  . IR ANGIO INTRA EXTRACRAN SEL COM CAROTID INNOMINATE BILAT MOD SED  05/21/2019  . IR ANGIO VERTEBRAL SEL VERTEBRAL BILAT MOD SED  05/21/2019  . MASTECTOMY Left 1982  . MAZE N/A 10/11/2012   Procedure: MAZE;  Surgeon: Rexene Alberts, MD;  Location: [place];  Service: Open Heart Surgery;  Laterality: N/A;  . MITRAL VALVE REPAIR N/A 10/11/2012   Procedure: MITRAL VALVE REPAIR (MVR);  Surgeon: Rexene Alberts, MD;  Location: [place];  Service: Open Heart Surgery;  Laterality: N/A;  . RIGHT HEART CATH AND CORONARY ANGIOGRAPHY N/A 12/24/2018   Procedure: RIGHT HEART CATH AND CORONARY ANGIOGRAPHY;  Surgeon: [name], MD;  Location: [place] [facility];  Service: Cardiovascular;  Laterality: N/A;    . TEE WITHOUT CARDIOVERSION N/A 07/31/2012   Procedure: TRANSESOPHAGEAL ECHOCARDIOGRAM (TEE);  Surgeon: Josue Hector, MD;  Location: [place];  Service: Cardiovascular;  Laterality: N/A;  . TEE WITHOUT CARDIOVERSION N/A 01/22/2019   Procedure: TRANSESOPHAGEAL ECHOCARDIOGRAM (TEE);  Surgeon: [name], MD;  Location: [place] [facility];  Service: Open Heart Surgery;  Laterality: N/A;  . TRANSCATHETER AORTIC VALVE REPLACEMENT, TRANSFEMORAL N/A 01/22/2019   Procedure: TRANSCATHETER AORTIC VALVE REPLACEMENT, TRANSFEMORAL;  Surgeon: [name], MD;  Location: [place] [facility];  Service: Open Heart Surgery;  Laterality: N/A;  . TRICUSPID VALVE REPLACEMENT N/A 10/11/2012   Procedure: TRICUSPID VALVE REPAIR;  Surgeon: Rexene Alberts, MD;  Location: [place];  Service: Open Heart Surgery;  Laterality: N/A;  . TUBAL LIGATION      Current Outpatient Medications  Medication Sig Dispense Refill  . acetaminophen (TYLENOL) 500 MG tablet Take 1,000 mg by mouth every 6 (six) hours as needed for moderate pain or headache.    Marland Kitchen aspirin EC 81 MG tablet Take 81 mg by mouth daily.    Marland Kitchen atorvastatin (LIPITOR) 40 MG tablet Take 40 mg by mouth daily.    . busPIRone (BUSPAR) 10 MG tablet Take 10 mg  by mouth 2 (two) times daily.     . Calcium Carb-Cholecalciferol (CALCIUM PLUS VITAMIN D3) 600-500 MG-UNIT CAPS Take 1 tablet by mouth 2 (two) times daily.    . Cyanocobalamin (VITAMIN B-12 PO) Take 1 tablet by mouth daily.    . Insulin Detemir (LEVEMIR FLEXPEN) 100 UNIT/ML Pen Inject 33 Units into the skin daily.     . magnesium hydroxide (MILK OF MAGNESIA) 400 MG/5ML suspension Take 30 mLs by mouth as needed.    . Melatonin 5 MG CAPS Take 5 mg by mouth at bedtime as needed (sleep).    Marland Kitchen omeprazole (PRILOSEC) 40 MG capsule Take 40 mg by mouth daily before breakfast.     . Rivaroxaban (XARELTO) 15 MG TABS tablet Take 15 mg by mouth daily with supper.    . Semaglutide,0.25 or 0.5MG /DOS,  (OZEMPIC, 0.25 OR 0.5 MG/DOSE,) 2 MG/1.5ML SOPN Inject 0.5 mg into the skin every Monday.     . venlafaxine XR (EFFEXOR-XR) 150 MG 24 hr capsule Take 150 mg by mouth daily with breakfast. Take with 75 mg capsule to equal 225 mg daily    . venlafaxine XR (EFFEXOR-XR) 75 MG 24 hr capsule Take 75 mg by mouth daily with breakfast. Take with 150 mg capsule to equal 225 mg daily     No current facility-administered medications for this visit.    Allergies  Allergen Reactions  . Metformin And Related Diarrhea  . Ultram [Tramadol] Nausea And Vomiting  . Nickel Rash    Pt unable to wear jewelry made of nickel.  . Pneumococcal Vaccines Swelling and Rash    At injection set    Social History   Socioeconomic History  . Marital status: Widowed    Spouse name: Not on file  . Number of children: 3  . Years of education: Not on file  . Highest education level: Not on file  Occupational History  . Occupation: Air cabin crew  Tobacco Use  . Smoking status: Never Smoker  . Smokeless tobacco: Never Used  Substance and Sexual Activity  . Alcohol use: No  . Drug use: No  . Sexual activity: Never  Other Topics Concern  . Not on file  Social History Narrative  . Not on file   Social Determinants of Health   Financial Resource Strain:   . Difficulty of Paying Living Expenses: Not on file  Food Insecurity:   . Worried About Charity fundraiser in the Last Year: Not on file  . Ran Out of Food in the Last Year: Not on file  Transportation Needs:   . Lack of Transportation (Medical): Not on file  . Lack of Transportation (Non-Medical): Not on file  Physical Activity:   . Days of Exercise per Week: Not on file  . Minutes of Exercise per Session: Not on file  Stress:   . Feeling of Stress : Not on file  Social Connections:   . Frequency of Communication with Friends and Family: Not on file  . Frequency of Social Gatherings with Friends and Family: Not on file  .  Attends Religious Services: Not on file  . Active Member of Clubs or Organizations: Not on file  . Attends Archivist Meetings: Not on file  . Marital Status: Not on file  Intimate Partner Violence:   . Fear of Current or Ex-Partner: Not on file  . Emotionally Abused: Not on file  . Physically Abused: Not on file  . Sexually Abused: Not on file  Family History  Problem Relation Age of Onset  . Cancer Father        prostate  . Hypertension Father   . Heart attack Paternal Grandfather   . Stroke Neg Hx     Review of Systems:  As stated in the HPI and otherwise negative.   BP 114/60   Pulse 73   Ht 5\' 6"  (1.676 m)   Wt 145 lb 12.8 oz (66.1 kg)   SpO2 97%   BMI 23.53 kg/m   Physical Examination:  General: Well developed, well nourished, NAD  HEENT: OP clear, mucus membranes moist  SKIN: warm, dry. No rashes. Neuro: No focal deficits  Musculoskeletal: Muscle strength 5/5 all ext  Psychiatric: Mood and affect normal  Neck: No JVD, no carotid bruits, no thyromegaly, no lymphadenopathy.  Lungs:Clear bilaterally, no wheezes, rhonci, crackles Cardiovascular: Regular rate and rhythm. No murmurs, gallops or rubs. Abdomen:Soft. Bowel sounds present. Non-tender.  Extremities: No lower extremity edema. Pulses are 2 + in the bilateral DP/PT.  Echo December 2020:  1. Left ventricular ejection fraction, by visual estimation, is 65 to  70%. The left ventricle has hyperdynamic function. There is mildly  increased left ventricular hypertrophy.  2. Elevated left ventricular end-diastolic pressure.  3. Left ventricular diastolic parameters are consistent with Grade II  diastolic dysfunction (pseudonormalization).  4. Global right ventricle has normal systolic function.The right  ventricular size is normal. No increase in right ventricular wall  thickness.  5. Left atrial size was mildly dilated.  6. Right atrial size was normal.  7. Moderate calcification of the  mitral valve leaflet(s).  8. Moderate thickening of the mitral valve leaflet(s).  9. Severe mitral annular calcification.  10. The mitral valve is normal in structure. No evidence of mitral valve  regurgitation. Mild mitral stenosis.  11. The tricuspid valve is normal in structure. Tricuspid valve  regurgitation is mild.  12. Aortic valve regurgitation is not visualized. No evidence of aortic  valve sclerosis or stenosis.  13. The pulmonic valve was normal in structure. Pulmonic valve  regurgitation is trivial.  14. The inferior vena cava is normal in size with greater than 50%  respiratory variability, suggesting right atrial pressure of 3 mmHg.  15. 23 mm Edwards SAPIEN 3 Ultra valve has peak/mean gradients 21/13 mmHg,  stable mild paravalvular leak. AVA 1.4 cm2.   EKG:  EKG is not ordered today. The ekg ordered today demonstrates   Recent Labs: 01/18/2019: ALT 21; B Natriuretic Peptide 67.9 01/23/2019: Magnesium 1.7 05/21/2019: BUN 34; Creatinine, Ser 0.90; Hemoglobin 13.3; Platelets 180; Potassium 4.8; Sodium 139   Lipid Panel    Component Value Date/Time   CHOL 112 01/23/2019 1530   TRIG 149 01/23/2019 1530   HDL 38 (L) 01/23/2019 1530   CHOLHDL 2.9 01/23/2019 1530   VLDL 30 01/23/2019 1530   LDLCALC 44 01/23/2019 1530     Wt Readings from Last 3 Encounters:  05/27/19 145 lb 12.8 oz (66.1 kg)  05/21/19 145 lb (65.8 kg)  03/27/19 148 lb 3.2 oz (67.2 kg)     Other studies Reviewed: Additional studies/ records that were reviewed today include: . Review of the above records demonstrates:    Assessment and Plan:   1. Severe Aortic stenosiss/p TAVR: Doing well post TAVR. Mild PVL by echo December 2020 with mildly elevated gradients. Continue ASA and Xarelto.   2. Mitral regurgitation: She is s/p MV ring in 2014, stable by echo 2020  3. Tricuspid regurgitation: She is s/p  TV ring in 2014. Stable by echo in 2020  4. CAD without angina: Mild CAD by cath in 2020.  She has no chest pain. Continue statin.     5. HTN: BP is stable.   6. Atrial fibrillation, paroxysmal: sinus today. Continue Xarelto.    7. Right groin hematoma: Well healed.   8. Thyroid nodule: She is being followed by Dr. Denton Lank at [facility]. She is asking me to check a TSH today which could not be done at her visit with Dr. Denton Lank one week ago due to lab issues.   9. Weakness/Dizziness/palpitations: Will arrange a 14 day event monitor and check a CBC today.   10. History of stroke: Planned cerebral angiogram per Int Radiology. Her Xarelto is being recommended by Neurology post stroke. I would suspect that she will need a Lovenox bridge prior to any procedures if Xarelto needed to be held. She could hold Xarelto purely from her cardiac indication (atrial fib).   Current medicines are reviewed at length with the patient today.  The patient does not have concerns regarding medicines.  The following changes have been made:  no change  Labs/ tests ordered today include:   Orders Placed This Encounter  Procedures  . CBC with Differential/Platelet  . TSH  . CARDIAC EVENT MONITOR    Disposition:   FU with me after her TAVR.   Signed, Lauree Chandler, MD 05/27/2019 2:34 PM    [facility] [place], [place], [place]  [address] Phone: [phone]; Fax: [phone]

## 2019-05-28 LAB — CBC WITH DIFFERENTIAL/PLATELET
Basophils Absolute: 0 10*3/uL (ref 0.0–0.2)
Basos: 0 %
EOS (ABSOLUTE): 0.1 10*3/uL (ref 0.0–0.4)
Eos: 1 %
Hematocrit: 37 % (ref 34.0–46.6)
Hemoglobin: 12.3 g/dL (ref 11.1–15.9)
Immature Grans (Abs): 0 10*3/uL (ref 0.0–0.1)
Immature Granulocytes: 0 %
Lymphocytes Absolute: 2.4 10*3/uL (ref 0.7–3.1)
Lymphs: 21 %
MCH: 31.1 pg (ref 26.6–33.0)
MCHC: 33.2 g/dL (ref 31.5–35.7)
MCV: 94 fL (ref 79–97)
Monocytes Absolute: 0.9 10*3/uL (ref 0.1–0.9)
Monocytes: 8 %
Neutrophils Absolute: 7.9 10*3/uL — ABNORMAL HIGH (ref 1.4–7.0)
Neutrophils: 70 %
Platelets: 250 10*3/uL (ref 150–450)
RBC: 3.95 x10E6/uL (ref 3.77–5.28)
RDW: 12 % (ref 11.7–15.4)
WBC: 11.3 10*3/uL — ABNORMAL HIGH (ref 3.4–10.8)

## 2019-05-28 LAB — TSH: TSH: 4.38 u[IU]/mL (ref 0.450–4.500)

## 2019-05-29 ENCOUNTER — Telehealth: Payer: Self-pay

## 2019-05-29 NOTE — Telephone Encounter (Signed)
-----   Message from [name], MD sent at 05/28/2019 10:33 AM EST ----- Hemoglobin is stable. TSH is ok. Can we let her know? I will forward the TSH results to Dr. Denton Lank.   Rachel Santos

## 2019-05-29 NOTE — Telephone Encounter (Signed)
The patient has been notified of the result and verbalized understanding.  All questions (if any) were answered. Wilma Flavin, RN 05/29/2019 9:09 AM

## 2019-05-31 ENCOUNTER — Encounter (INDEPENDENT_AMBULATORY_CARE_PROVIDER_SITE_OTHER): Payer: Medicare Other

## 2019-05-31 ENCOUNTER — Telehealth (HOSPITAL_COMMUNITY): Payer: Self-pay | Admitting: Radiology

## 2019-05-31 DIAGNOSIS — R42 Dizziness and giddiness: Secondary | ICD-10-CM | POA: Diagnosis not present

## 2019-05-31 DIAGNOSIS — R002 Palpitations: Secondary | ICD-10-CM

## 2019-05-31 NOTE — Telephone Encounter (Signed)
Returned pt's daughter's call. Left VM for her to call me back to discuss next steps for her mom's care. JM

## 2019-06-11 ENCOUNTER — Other Ambulatory Visit (HOSPITAL_COMMUNITY): Payer: Self-pay | Admitting: Interventional Radiology

## 2019-06-11 DIAGNOSIS — I771 Stricture of artery: Secondary | ICD-10-CM

## 2019-06-12 ENCOUNTER — Telehealth: Payer: Self-pay | Admitting: Student

## 2019-06-12 NOTE — Telephone Encounter (Signed)
NIR.  Patient scheduled for an image-guided cerebral arteriogram with intent to treat left MCA stenosis tentatively for 06/19/2019 in IR with Dr. Estanislado Pandy. Patient currently taking Xarelto 15 mg once daily for atrial fibrillation. Patient will need to hold this 24 hours prior to procedure, she will be bridged with Lovenox throughout hold.  Bridge instructions: - Patient to take last dose Xarelto 06/17/2019. - Patient to inject Lovenox 60 mg SQ BID 06/18/2019. - Patient to inject Lovenox 60 mg SQ once in AM 06/19/2019. Patient to be restarted on Xarelto following procedure prior to discharge from [facility].  Called patients daughter, Rachel Santos, at 40 to discuss above. Confirmed pharmacy with Rachel Santos- [id number]. Rachel Santos had additional pre-procedure questions- all questions answered and concerns addressed. Rachel Santos conveys understanding and agrees with plan.  Prescription called into [id number] at 1229- Lovenox 60 mg injections, inject 60 mg SQ BID for 3 doses, dispense 3 syringes with 0 refills.  Please call NIR with questions/concerns.   Rachel Graff Laith Antonelli, PA-C 06/12/2019, 12:37 PM

## 2019-06-15 ENCOUNTER — Other Ambulatory Visit (HOSPITAL_COMMUNITY)
Admission: RE | Admit: 2019-06-15 | Discharge: 2019-06-15 | Disposition: A | Discharge status: Home / Self Care | Payer: Medicare Other | Source: Ambulatory Visit | Attending: Interventional Radiology | Admitting: Interventional Radiology

## 2019-06-15 DIAGNOSIS — Z01812 Encounter for preprocedural laboratory examination: Secondary | ICD-10-CM | POA: Insufficient documentation

## 2019-06-15 DIAGNOSIS — Z20822 Contact with and (suspected) exposure to covid-19: Secondary | ICD-10-CM | POA: Insufficient documentation

## 2019-06-15 LAB — SARS CORONAVIRUS 2 (TAT 6-24 HRS): SARS Coronavirus 2: NEGATIVE

## 2019-06-18 ENCOUNTER — Telehealth: Payer: Self-pay | Admitting: Student

## 2019-06-18 ENCOUNTER — Other Ambulatory Visit: Payer: Self-pay | Admitting: Student

## 2019-06-18 ENCOUNTER — Other Ambulatory Visit: Payer: Self-pay

## 2019-06-18 ENCOUNTER — Other Ambulatory Visit: Payer: Self-pay | Admitting: Radiology

## 2019-06-18 ENCOUNTER — Encounter (HOSPITAL_COMMUNITY): Payer: Self-pay | Admitting: Interventional Radiology

## 2019-06-18 NOTE — Progress Notes (Signed)
Pt denies any acute cardiopulmonary issues. Pt stated that she is under the care of Dr Angelena Form, Cardiology and Dr. Ashby Dawes, PCP. Pt stated that a nuclear stress test was performed however, pt  cannot recall the year. Pt denies recent labs. Pt made aware to stop taking vitamins, fish oil and herbal medications. Do not take any NSAIDs ie: Ibuprofen, Advil, Naproxen (Aleve), Motrin, BC and Goody Powder. Pt stated that last dose of Xarelto was the morning of 06/17/19 and last dose of Lovenox will be the morning of surgery as instructed. Pt made aware to take 16 units of Levemir insulin DOS if CBG is > 70. Pt made aware to check CBG every 2 hours prior to arrival to hospital on DOS. Pt made aware to hold Levemir DOS if CBG is < 70 treat a CBG < 70 with 4 ounces of cranberry juice, wait 15 minutes after intervention to recheck BG, if BG remains < 70, call Short Stay unit to speak with a nurse. Pt reminded to quarantine. Pt verbalized understanding of all pre-op instructions. PA, Anesthesiology, asked to review pt history.

## 2019-06-18 NOTE — Anesthesia Preprocedure Evaluation (Addendum)
Anesthesia Evaluation  Patient identified by MRN, date of birth, ID band Patient awake    Reviewed: Allergy & Precautions, NPO status , Patient's Chart, lab work & pertinent test results  History of Anesthesia Complications (+) PONV and history of anesthetic complications  Airway Mallampati: II  TM Distance: >3 FB Neck ROM: Full    Dental  (+) Edentulous Upper, Edentulous Lower   Pulmonary neg pulmonary ROS,    Pulmonary exam normal        Cardiovascular hypertension, Normal cardiovascular exam  TTE 03/04/19: EF 65-70%, mild LVH, grade II  diastolic dysfunction, mild LAE, mild MS, mild TR   Hx of TAVR   Neuro/Psych Anxiety Depression CVA    GI/Hepatic Neg liver ROS, GERD  Medicated and Controlled,  Endo/Other  diabetes, Type 2, Insulin Dependent  Renal/GU negative Renal ROS  negative genitourinary   Musculoskeletal  (+) Arthritis ,   Abdominal   Peds  Hematology negative hematology ROS (+)   Anesthesia Other Findings Day of surgery medications reviewed with patient.  Reproductive/Obstetrics negative OB ROS                           Anesthesia Physical Anesthesia Plan  ASA: III  Anesthesia Plan: General   Post-op Pain Management:    Induction: Intravenous  PONV Risk Score and Plan: 4 or greater and Treatment may vary due to age or medical condition and Ondansetron  Airway Management Planned: Oral ETT  Additional Equipment: Arterial line  Intra-op Plan:   Post-operative Plan: Extubation in OR  Informed Consent: I have reviewed the patients History and Physical, chart, labs and discussed the procedure including the risks, benefits and alternatives for the proposed anesthesia with the patient or authorized representative who has indicated his/her understanding and acceptance.     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 Recently seen by Dr. Angelena Form 05/27/18 and doing well. Per note, "Severe Aortic stenosiss/p TAVR: Doing well post TAVR (01/22/19). Mild PVL by echo December 2020 with mildly elevated gradients. Continue ASA and Xarelto. Mitral regurgitation: She is s/p MV ring in 2014, stable by echo 2020. Tricuspid regurgitation: She is s/p TV ring in 2014. Stable by echo in 2020. CAD without angina: Mild CAD by cath in 2020. She has no chest pain. Continue statin.  Atrial fibrillation, paroxysmal: sinus today. Continue Xarelto.  History of stroke: Planned cerebral angiogram per Int Radiology. Her Xarelto is being recommended by Neurology post stroke. I would suspect that she will need a Lovenox bridge prior to any procedures if Xarelto needed to be held. She could hold Xarelto purely from her cardiac indication (atrial fib)."  Patient was noted to have a large right groin hematoma at the time of her TAVR which required surgical exploration.  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   Pt is on Lovenox bridge  per IR.  Will need DOS labs and eval.  EKG 01/23/19: Junctional rhythm. Rate 61  IR Angio 05/21/19: IMPRESSION: Severe proximal segmental narrowing of the central codominant branch in the left MCA trifurcation most likely secondary to intracranial arteriosclerosis.  Approximately 40% stenosis of the  proximal basilar artery.  Approximately 50% narrowing of the right posterior cerebral artery distal P2 segment.  Severe focal narrowing of the left posterior cerebral P2 P3 junction. 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  TTE 03/04/19: 1. Left ventricular ejection fraction, by visual estimation, is 65 to  70%. The left ventricle has hyperdynamic function. There is mildly  increased left ventricular hypertrophy.  2. Elevated left ventricular end-diastolic pressure.  3. Left ventricular diastolic parameters are consistent with Grade II  diastolic dysfunction (pseudonormalization).  4. Global right ventricle has normal systolic function.The right  ventricular size is normal. No increase in right ventricular wall  thickness.  5. Left atrial size was mildly dilated.  6. Right atrial size was normal.  7. Moderate calcification of the mitral valve leaflet(s).  8. Moderate thickening of the mitral valve leaflet(s).  9. Severe mitral annular calcification.  10. The mitral valve is normal in structure. No evidence of mitral valve  regurgitation. Mild mitral stenosis.  11. The tricuspid valve is normal in structure. Tricuspid valve  regurgitation is mild.  12. Aortic valve regurgitation is not visualized. No evidence of aortic  valve sclerosis or stenosis.  13. The pulmonic valve was normal in structure. Pulmonic valve  regurgitation is trivial.  14. The inferior vena cava is normal in size with greater than 50%  respiratory variability, suggesting right atrial pressure of 3 mmHg.  15. 23 mm Edwards SAPIEN 3 Ultra valve has peak/mean gradients 21/13 mmHg,  stable mild paravalvular leak. AVA 1.4 cm2.   Cath 12/24/18 (pre AVR) :   1. Mild non-obstructive disease in the LAD and Circumflex 2. Moderate non-obstructive disease in the mid and distal RCA, PDA.  3. Severe aortic stenosis by  echo. Unable to cross the aortic valve in the cath lab from the radial approach.  )      Anesthesia Quick Evaluation

## 2019-06-18 NOTE — Progress Notes (Signed)
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 Recently seen by Dr. Angelena Form 05/27/18 and doing well. Per note, "Severe Aortic stenosiss/p TAVR: Doing well post TAVR (01/22/19). Mild PVL by echo December 2020 with mildly elevated gradients. Continue ASA and Xarelto. Mitral regurgitation: She is s/p MV ring in 2014, stable by echo 2020. Tricuspid regurgitation: She is s/p TV ring in 2014. Stable by echo in 2020. CAD without angina: Mild CAD by cath in 2020. She has no chest pain. Continue statin.  Atrial fibrillation, paroxysmal: sinus today. Continue Xarelto.  History of stroke: Planned cerebral angiogram per Int Radiology. Her Xarelto is being recommended by Neurology post stroke. I would suspect that she will need a Lovenox bridge prior to any procedures if Xarelto needed to be held. She could hold Xarelto purely from her cardiac indication (atrial fib)."  Patient was noted to have a large right groin hematoma at the time of her TAVR which required surgical exploration.  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   Pt is on Lovenox bridge per IR.  Will need DOS labs and eval.  EKG 01/23/19: Junctional rhythm. Rate 61  IR Angio 05/21/19: IMPRESSION: Severe proximal segmental narrowing of the central codominant branch in the left MCA trifurcation most likely secondary to  intracranial arteriosclerosis.  Approximately 40% stenosis of the proximal basilar artery.  Approximately 50% narrowing of the right posterior cerebral artery distal P2 segment.  Severe focal narrowing of the left posterior cerebral P2 P3 junction. 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  TTE 03/04/19: 1. Left ventricular ejection fraction, by visual estimation, is 65 to  70%. The left ventricle has hyperdynamic function. There is mildly  increased left ventricular hypertrophy.  2. Elevated left ventricular end-diastolic pressure.  3. Left ventricular diastolic parameters are consistent with Grade II  diastolic dysfunction (pseudonormalization).  4. Global right ventricle has normal systolic function.The right  ventricular size is normal. No increase in right ventricular wall  thickness.  5. Left atrial size was mildly dilated.  6. Right atrial size was normal.  7. Moderate calcification of the mitral valve leaflet(s).  8. Moderate thickening of the mitral valve leaflet(s).  9. Severe mitral annular calcification.  10. The mitral valve is normal in structure. No evidence of mitral valve  regurgitation. Mild mitral stenosis.  11. The tricuspid valve is normal in structure. Tricuspid valve  regurgitation is mild.  12. Aortic valve regurgitation is not visualized. No evidence of aortic  valve sclerosis or stenosis.  13. The pulmonic valve was normal in structure. Pulmonic valve  regurgitation is trivial.  14. The inferior vena cava is normal in size with greater than 50%  respiratory variability, suggesting right atrial pressure of 3 mmHg.  15. 23 mm Edwards SAPIEN 3 Ultra valve has peak/mean gradients 21/13 mmHg,  stable mild paravalvular leak. AVA 1.4 cm2.  Cath 12/24/18 (pre AVR) :   1. Mild non-obstructive disease in the LAD and Circumflex 2. Moderate non-obstructive  disease in the mid and distal RCA, PDA.  3. Severe aortic stenosis by echo. Unable to cross the aortic valve in the cath lab from the radial approach.   Wynonia Musty [facility] Short Stay Center/Anesthesiology Phone [phone] 06/18/2019 1:35 PM

## 2019-06-18 NOTE — Telephone Encounter (Signed)
NIR.  Received message from patient's daughter, [name], requesting that I call her back regarding Lovenox bridging questions.  Called Carla [phone]) at [phone] and spoke with her. [name] states that her mom woke up late, and she did not take her Lovenox until later this morning. She is curious if she should continue with bridging. Instructed [name] to continue Lovenox bridge as directed (see below from prior telephone encounter 06/12/2018):  Bridge instructions: - Patient to take last dose Xarelto 06/17/2019. - Patient to inject Lovenox 60 mg SQ BID 06/18/2019. - Patient to inject Lovenox 60 mg SQ once in AM 06/19/2019. Patient to be restarted on Xarelto following procedure prior to discharge from [facility].  All questions answered and concerns addressed. Patient's daughter, [name], conveys understanding and agrees with plan. Please call NIR with questions/concerns.   Bea Graff Louk, PA-C 06/18/2019, 1:47 PM

## 2019-06-19 ENCOUNTER — Encounter (HOSPITAL_COMMUNITY): Payer: Self-pay | Admitting: Interventional Radiology

## 2019-06-19 ENCOUNTER — Inpatient Hospital Stay (HOSPITAL_COMMUNITY): Payer: Medicare Other | Admitting: Physician Assistant

## 2019-06-19 ENCOUNTER — Ambulatory Visit (HOSPITAL_COMMUNITY): Admission: RE | Admit: 2019-06-19 | Payer: Medicare Other | Source: Ambulatory Visit

## 2019-06-19 ENCOUNTER — Encounter (HOSPITAL_COMMUNITY): Payer: Self-pay

## 2019-06-19 ENCOUNTER — Other Ambulatory Visit: Payer: Self-pay

## 2019-06-19 ENCOUNTER — Ambulatory Visit (HOSPITAL_COMMUNITY)
Admission: RE | Admit: 2019-06-19 | Discharge: 2019-06-19 | Disposition: A | Discharge status: Home / Self Care | Payer: Medicare Other | Attending: Interventional Radiology | Admitting: Interventional Radiology

## 2019-06-19 ENCOUNTER — Encounter (HOSPITAL_COMMUNITY)
Admission: RE | Disposition: A | Discharge status: Home / Self Care | Payer: Self-pay | Source: Home / Self Care | Attending: Interventional Radiology

## 2019-06-19 DIAGNOSIS — K219 Gastro-esophageal reflux disease without esophagitis: Secondary | ICD-10-CM | POA: Insufficient documentation

## 2019-06-19 DIAGNOSIS — I6602 Occlusion and stenosis of left middle cerebral artery: Secondary | ICD-10-CM | POA: Diagnosis not present

## 2019-06-19 DIAGNOSIS — I480 Paroxysmal atrial fibrillation: Secondary | ICD-10-CM | POA: Diagnosis not present

## 2019-06-19 DIAGNOSIS — E119 Type 2 diabetes mellitus without complications: Secondary | ICD-10-CM | POA: Diagnosis not present

## 2019-06-19 DIAGNOSIS — Z8673 Personal history of transient ischemic attack (TIA), and cerebral infarction without residual deficits: Secondary | ICD-10-CM | POA: Diagnosis not present

## 2019-06-19 DIAGNOSIS — Z7982 Long term (current) use of aspirin: Secondary | ICD-10-CM | POA: Diagnosis not present

## 2019-06-19 DIAGNOSIS — F419 Anxiety disorder, unspecified: Secondary | ICD-10-CM | POA: Diagnosis not present

## 2019-06-19 DIAGNOSIS — I2510 Atherosclerotic heart disease of native coronary artery without angina pectoris: Secondary | ICD-10-CM | POA: Diagnosis not present

## 2019-06-19 DIAGNOSIS — M1990 Unspecified osteoarthritis, unspecified site: Secondary | ICD-10-CM | POA: Insufficient documentation

## 2019-06-19 DIAGNOSIS — I10 Essential (primary) hypertension: Secondary | ICD-10-CM | POA: Insufficient documentation

## 2019-06-19 DIAGNOSIS — F329 Major depressive disorder, single episode, unspecified: Secondary | ICD-10-CM | POA: Insufficient documentation

## 2019-06-19 DIAGNOSIS — Z5309 Procedure and treatment not carried out because of other contraindication: Secondary | ICD-10-CM | POA: Insufficient documentation

## 2019-06-19 DIAGNOSIS — Z7901 Long term (current) use of anticoagulants: Secondary | ICD-10-CM | POA: Insufficient documentation

## 2019-06-19 HISTORY — PX: RADIOLOGY WITH ANESTHESIA: SHX6223

## 2019-06-19 HISTORY — DX: Depression, unspecified: F32.A

## 2019-06-19 HISTORY — DX: Presence of external hearing-aid: Z97.4

## 2019-06-19 HISTORY — DX: Pneumonia, unspecified organism: J18.9

## 2019-06-19 HISTORY — DX: Complete loss of teeth, unspecified cause, unspecified class: K08.109

## 2019-06-19 HISTORY — DX: Malignant (primary) neoplasm, unspecified: C80.1

## 2019-06-19 HISTORY — DX: Occlusion and stenosis of left middle cerebral artery: I66.02

## 2019-06-19 HISTORY — DX: Presence of spectacles and contact lenses: Z97.3

## 2019-06-19 HISTORY — DX: Nontoxic single thyroid nodule: E04.1

## 2019-06-19 HISTORY — DX: Complete loss of teeth, unspecified cause, unspecified class: Z97.2

## 2019-06-19 LAB — URINALYSIS, COMPLETE (UACMP) WITH MICROSCOPIC
Bilirubin Urine: NEGATIVE
Glucose, UA: >=500 mg/dL — AB
Hgb urine dipstick: NEGATIVE
Ketones, ur: NEGATIVE mg/dL
Nitrite: NEGATIVE
Protein, ur: NEGATIVE mg/dL
Specific Gravity, Urine: 1.01 (ref 1.005–1.030)
pH: 7 (ref 5.0–8.0)

## 2019-06-19 LAB — BASIC METABOLIC PANEL
Anion gap: 10 (ref 5–15)
BUN: 30 mg/dL — ABNORMAL HIGH (ref 8–23)
CO2: 26 mmol/L (ref 22–32)
Calcium: 9.2 mg/dL (ref 8.9–10.3)
Chloride: 103 mmol/L (ref 98–111)
Creatinine, Ser: 1.09 mg/dL — ABNORMAL HIGH (ref 0.44–1.00)
GFR calc Af Amer: 56 mL/min — ABNORMAL LOW (ref >60)
GFR calc non Af Amer: 48 mL/min — ABNORMAL LOW (ref >60)
Glucose, Bld: 130 mg/dL — ABNORMAL HIGH (ref 70–99)
Potassium: 3.9 mmol/L (ref 3.5–5.1)
Sodium: 139 mmol/L (ref 135–145)

## 2019-06-19 LAB — CBC WITH DIFFERENTIAL/PLATELET
Abs Immature Granulocytes: 0.02 10*3/uL (ref 0.00–0.07)
Basophils Absolute: 0.1 10*3/uL (ref 0.0–0.1)
Basophils Relative: 1 %
Eosinophils Absolute: 0.1 10*3/uL (ref 0.0–0.5)
Eosinophils Relative: 2 %
HCT: 38.9 % (ref 36.0–46.0)
Hemoglobin: 12.9 g/dL (ref 12.0–15.0)
Immature Granulocytes: 0 %
Lymphocytes Relative: 30 %
Lymphs Abs: 2.3 10*3/uL (ref 0.7–4.0)
MCH: 31.4 pg (ref 26.0–34.0)
MCHC: 33.2 g/dL (ref 30.0–36.0)
MCV: 94.6 fL (ref 80.0–100.0)
Monocytes Absolute: 0.7 10*3/uL (ref 0.1–1.0)
Monocytes Relative: 10 %
Neutro Abs: 4.2 10*3/uL (ref 1.7–7.7)
Neutrophils Relative %: 57 %
Platelets: 210 10*3/uL (ref 150–400)
RBC: 4.11 MIL/uL (ref 3.87–5.11)
RDW: 13.2 % (ref 11.5–15.5)
WBC: 7.4 10*3/uL (ref 4.0–10.5)
nRBC: 0 % (ref 0.0–0.2)

## 2019-06-19 LAB — PLATELET INHIBITION P2Y12: Platelet Function  P2Y12: 297 [PRU] (ref 182–335)

## 2019-06-19 LAB — PROTIME-INR
INR: 1.1 (ref 0.8–1.2)
Prothrombin Time: 13.9 seconds (ref 11.4–15.2)

## 2019-06-19 LAB — GLUCOSE, CAPILLARY: Glucose-Capillary: 110 mg/dL — ABNORMAL HIGH (ref 70–99)

## 2019-06-19 LAB — APTT: aPTT: 43 seconds — ABNORMAL HIGH (ref 24–36)

## 2019-06-19 SURGERY — IR WITH ANESTHESIA
Anesthesia: General

## 2019-06-19 MED ORDER — CEFAZOLIN SODIUM-DEXTROSE 2-4 GM/100ML-% IV SOLN: 2.0000 g | INTRAVENOUS | Status: DC

## 2019-06-19 MED ORDER — NIMODIPINE 30 MG PO CAPS: 0.0000 mg | ORAL_CAPSULE | ORAL | Status: DC

## 2019-06-19 MED ORDER — CLOPIDOGREL BISULFATE 75 MG PO TABS: 75.0000 mg | ORAL_TABLET | ORAL | Status: DC

## 2019-06-19 MED ORDER — SODIUM CHLORIDE 0.9 % IV SOLN: INTRAVENOUS | Status: DC

## 2019-06-19 MED ORDER — ASPIRIN EC 325 MG PO TBEC: 325.0000 mg | DELAYED_RELEASE_TABLET | ORAL | Status: DC

## 2019-06-19 MED ORDER — FENTANYL CITRATE (PF) 100 MCG/2ML IJ SOLN
INTRAMUSCULAR | Status: AC
  Filled 2019-06-19: qty 2

## 2019-06-19 MED ORDER — ACETAMINOPHEN 500 MG PO TABS: 1000.0000 mg | ORAL_TABLET | Freq: Once | ORAL | Status: DC

## 2019-06-19 NOTE — Progress Notes (Signed)
NIR.  Patient was scheduled for an image-guided cerebral arteriogram with possible revascularization (angioplasty/stent placement) of left MCA stenosis tentatively for today with Dr. Estanislado Pandy.  [id number] PRU this AM. Discussed with patient who states she has not been taking her Plavix. Discussed with patient's daughter, [name], via telephone as well, who states that she remembers being told to start Plavix at original consult, but was not reminded and does not remember receiving a prescription. Informed patient and daughter that due to safety reasons (increased risk of stroke/intrastent thrombosis without DAPT use), procedure will not occur today. Prescription for Brilinta (90 tablets taken twice daily, dispense 60 tablets with 3 refills) given to patient.  Discharge/medication instructions upon discharge, explained to [facility] via telephone by Dr. Estanislado Pandy: - Schedulers to call [name] to reschedule procedure. - Begin taking Xarelto as prescribed starting tomorrow 06/20/2019. Explained that PA will call [name] to discuss Lovenox bridging once procedure rescheduled (as in give prescription, tell when to take, and tell when to stop Xarelto). - Continue taking Aspirin 81 mg once daily. Explained that PA will call [name] to discuss when to start Brilinta once procedure rescheduled.  Please call NIR with questions/concerns.   Bea Graff Bharath Bernstein, PA-C 06/19/2019, 9:37 AM

## 2019-06-19 NOTE — Progress Notes (Signed)
Per Dr. Estanislado Pandy, surgery is cancelled due to labs.  RX for Plavix given to patient.

## 2019-06-20 ENCOUNTER — Encounter: Payer: Self-pay | Admitting: *Deleted

## 2019-06-20 ENCOUNTER — Telehealth (HOSPITAL_COMMUNITY): Payer: Self-pay | Admitting: Radiology

## 2019-06-20 NOTE — Telephone Encounter (Signed)
Tried to call pt's daughter [name]) x3 today. No answer and no VM. Called pt and asked her to let her daughter know that I was trying to reach her and to have her call me back. She said she will tell her daughter to call me. JM

## 2019-06-21 ENCOUNTER — Telehealth: Payer: Self-pay | Admitting: Student

## 2019-06-21 NOTE — Telephone Encounter (Signed)
NIR.  Patient scheduled for an image-guided cerebral arteriogram with intent to treat left MCA stenosis tentatively for 06/27/2019 in IR with Dr. Estanislado Pandy. Patient currently taking Xarelto 15 mg once daily for atrial fibrillation. Patient will need to hold this 24 hours prior to procedure, she will be bridged with Lovenox throughout hold.  Called patient's daughter, Rachel Santos [phone]), at 45 to discuss anticoagulation instructions:  Bridge instructions: - Patient to take last dose Xarelto 06/17/2019. - Patient to inject Lovenox 60 mg SQ BID 06/18/2019. - Patient to inject Lovenox 60 mg SQ once in AM 06/19/2019. - Patient to be restarted on Xarelto following procedure prior to discharge from [facility].  Other anticoagulation instructions for procedure: - Patient to begin taking Brilinta 90 mg twice daily, first dose 06/22/2019. Patient's daughter aware not to stop this medication unless directed by our office. - Patient to continue taking Aspirin 81 mg once daily. Patient's daughter aware not to stop this medication unless directed by our office.  Prescription called into [id number] at 1426- Lovenox 60 mg injections, inject 60 mg SQ BID for 3 doses, dispense 3 syringes with 0 refills.  Please call NIR with questions/concerns.   Rachel Graff Needham Biggins, PA-C 06/21/2019, 2:35 PM

## 2019-06-25 ENCOUNTER — Other Ambulatory Visit: Payer: Self-pay | Admitting: Radiology

## 2019-06-25 ENCOUNTER — Other Ambulatory Visit (HOSPITAL_COMMUNITY): Admission: RE | Admit: 2019-06-25 | Payer: Medicare Other | Source: Ambulatory Visit

## 2019-06-26 ENCOUNTER — Other Ambulatory Visit: Payer: Self-pay | Admitting: Radiology

## 2019-06-26 ENCOUNTER — Encounter (HOSPITAL_COMMUNITY): Payer: Self-pay | Admitting: Anesthesiology

## 2019-06-26 ENCOUNTER — Other Ambulatory Visit: Payer: Self-pay

## 2019-06-26 ENCOUNTER — Telehealth (HOSPITAL_COMMUNITY): Payer: Self-pay | Admitting: Radiology

## 2019-06-26 ENCOUNTER — Encounter (HOSPITAL_COMMUNITY): Payer: Self-pay | Admitting: Interventional Radiology

## 2019-06-26 ENCOUNTER — Other Ambulatory Visit: Payer: Self-pay | Admitting: Student

## 2019-06-26 NOTE — Telephone Encounter (Signed)
Called pt's daughter back to let her know that anesthesia has agreed to perform a COVID rapid test on pt the morning of her procedure on 06/27/19. She agreed to have the patient arrive at 5:30 am for this testing. JM

## 2019-06-26 NOTE — Progress Notes (Signed)
SDW pre-op call completed by both pt and pt daughter, [name] ([place]). Pt denies any acute cardiopulmonary issues. Pt stated that she is under the care of Dr Angelena Form, Cardiology and Dr. Ashby Dawes, PCP. Pt stated that a nuclear stress test was performed however, pt  cannot recall the year. Pt daughter made aware to have pt stop taking vitamins, fish oil and herbal medications. Do not take any NSAIDs ie: Ibuprofen, Advil, Naproxen (Aleve), Motrin, BC and Goody Powder. Daughter stated that pt  last dose of Xarelto was 06/24/29 and last dose of Lovenox will be the morning of surgery as instructed. Daughter stated that pt was instructed to take no Levemir insulin the morning of surgery when nurse attempted to instruct pt to take 16 units of Levemir insulin DOS if CBG is > 70. Daughter made aware to have pt check CBG every 2 hours prior to arrival to hospital on DOS. treat a CBG < 70 with 4 ounces of Apple juice or Cranberry juice, wait 15 minutes after intervention to recheck BG, if BG remains < 70, call Short Stay unit to speak with a nurse. Daughter verbalized understanding of all pre-op instructions. See PA, Anesthesiology, note.

## 2019-06-27 ENCOUNTER — Ambulatory Visit (HOSPITAL_COMMUNITY)
Admission: RE | Admit: 2019-06-27 | Discharge: 2019-06-27 | Disposition: A | Discharge status: Home / Self Care | Payer: Medicare Other | Attending: Interventional Radiology | Admitting: Interventional Radiology

## 2019-06-27 ENCOUNTER — Ambulatory Visit (HOSPITAL_COMMUNITY): Payer: Medicare Other

## 2019-06-27 ENCOUNTER — Inpatient Hospital Stay (HOSPITAL_COMMUNITY)
Admission: RE | Admit: 2019-06-27 | Discharge: 2019-06-27 | Disposition: A | Discharge status: Home / Self Care | Payer: Medicare Other | Source: Ambulatory Visit | Attending: Interventional Radiology | Admitting: Interventional Radiology

## 2019-06-27 ENCOUNTER — Encounter (HOSPITAL_COMMUNITY): Payer: Self-pay | Admitting: Interventional Radiology

## 2019-06-27 ENCOUNTER — Encounter (HOSPITAL_COMMUNITY)
Admission: RE | Disposition: A | Discharge status: Home / Self Care | Payer: Self-pay | Source: Home / Self Care | Attending: Interventional Radiology

## 2019-06-27 ENCOUNTER — Other Ambulatory Visit: Payer: Self-pay

## 2019-06-27 DIAGNOSIS — I10 Essential (primary) hypertension: Secondary | ICD-10-CM | POA: Diagnosis not present

## 2019-06-27 DIAGNOSIS — F419 Anxiety disorder, unspecified: Secondary | ICD-10-CM | POA: Insufficient documentation

## 2019-06-27 DIAGNOSIS — E041 Nontoxic single thyroid nodule: Secondary | ICD-10-CM | POA: Diagnosis not present

## 2019-06-27 DIAGNOSIS — Z79899 Other long term (current) drug therapy: Secondary | ICD-10-CM | POA: Diagnosis not present

## 2019-06-27 DIAGNOSIS — I6381 Other cerebral infarction due to occlusion or stenosis of small artery: Secondary | ICD-10-CM | POA: Diagnosis not present

## 2019-06-27 DIAGNOSIS — I4891 Unspecified atrial fibrillation: Secondary | ICD-10-CM | POA: Diagnosis not present

## 2019-06-27 DIAGNOSIS — I771 Stricture of artery: Secondary | ICD-10-CM | POA: Insufficient documentation

## 2019-06-27 DIAGNOSIS — Z7901 Long term (current) use of anticoagulants: Secondary | ICD-10-CM | POA: Insufficient documentation

## 2019-06-27 DIAGNOSIS — M1990 Unspecified osteoarthritis, unspecified site: Secondary | ICD-10-CM | POA: Insufficient documentation

## 2019-06-27 DIAGNOSIS — Z20822 Contact with and (suspected) exposure to covid-19: Secondary | ICD-10-CM | POA: Diagnosis not present

## 2019-06-27 DIAGNOSIS — I6623 Occlusion and stenosis of bilateral posterior cerebral arteries: Secondary | ICD-10-CM | POA: Diagnosis not present

## 2019-06-27 DIAGNOSIS — K219 Gastro-esophageal reflux disease without esophagitis: Secondary | ICD-10-CM | POA: Insufficient documentation

## 2019-06-27 DIAGNOSIS — Z794 Long term (current) use of insulin: Secondary | ICD-10-CM | POA: Insufficient documentation

## 2019-06-27 DIAGNOSIS — I6529 Occlusion and stenosis of unspecified carotid artery: Secondary | ICD-10-CM | POA: Diagnosis not present

## 2019-06-27 DIAGNOSIS — E1136 Type 2 diabetes mellitus with diabetic cataract: Secondary | ICD-10-CM | POA: Insufficient documentation

## 2019-06-27 DIAGNOSIS — F329 Major depressive disorder, single episode, unspecified: Secondary | ICD-10-CM | POA: Insufficient documentation

## 2019-06-27 DIAGNOSIS — R21 Rash and other nonspecific skin eruption: Secondary | ICD-10-CM | POA: Diagnosis not present

## 2019-06-27 DIAGNOSIS — Z7982 Long term (current) use of aspirin: Secondary | ICD-10-CM | POA: Diagnosis not present

## 2019-06-27 DIAGNOSIS — I669 Occlusion and stenosis of unspecified cerebral artery: Secondary | ICD-10-CM | POA: Diagnosis present

## 2019-06-27 HISTORY — DX: Dyspnea, unspecified: R06.00

## 2019-06-27 LAB — CBC WITH DIFFERENTIAL/PLATELET
Abs Immature Granulocytes: 0.02 10*3/uL (ref 0.00–0.07)
Basophils Absolute: 0 10*3/uL (ref 0.0–0.1)
Basophils Relative: 1 %
Eosinophils Absolute: 0.1 10*3/uL (ref 0.0–0.5)
Eosinophils Relative: 2 %
HCT: 41.2 % (ref 36.0–46.0)
Hemoglobin: 13.4 g/dL (ref 12.0–15.0)
Immature Granulocytes: 0 %
Lymphocytes Relative: 30 %
Lymphs Abs: 1.8 10*3/uL (ref 0.7–4.0)
MCH: 30.8 pg (ref 26.0–34.0)
MCHC: 32.5 g/dL (ref 30.0–36.0)
MCV: 94.7 fL (ref 80.0–100.0)
Monocytes Absolute: 0.6 10*3/uL (ref 0.1–1.0)
Monocytes Relative: 9 %
Neutro Abs: 3.6 10*3/uL (ref 1.7–7.7)
Neutrophils Relative %: 58 %
Platelets: 204 10*3/uL (ref 150–400)
RBC: 4.35 MIL/uL (ref 3.87–5.11)
RDW: 12.8 % (ref 11.5–15.5)
WBC: 6.1 10*3/uL (ref 4.0–10.5)
nRBC: 0 % (ref 0.0–0.2)

## 2019-06-27 LAB — URINALYSIS, COMPLETE (UACMP) WITH MICROSCOPIC
Bilirubin Urine: NEGATIVE
Glucose, UA: 150 mg/dL — AB
Hgb urine dipstick: NEGATIVE
Ketones, ur: NEGATIVE mg/dL
Nitrite: NEGATIVE
Protein, ur: NEGATIVE mg/dL
Specific Gravity, Urine: 1.017 (ref 1.005–1.030)
pH: 7 (ref 5.0–8.0)

## 2019-06-27 LAB — BASIC METABOLIC PANEL
Anion gap: 12 (ref 5–15)
BUN: 28 mg/dL — ABNORMAL HIGH (ref 8–23)
CO2: 25 mmol/L (ref 22–32)
Calcium: 9.4 mg/dL (ref 8.9–10.3)
Chloride: 100 mmol/L (ref 98–111)
Creatinine, Ser: 1.1 mg/dL — ABNORMAL HIGH (ref 0.44–1.00)
GFR calc Af Amer: 55 mL/min — ABNORMAL LOW (ref >60)
GFR calc non Af Amer: 47 mL/min — ABNORMAL LOW (ref >60)
Glucose, Bld: 220 mg/dL — ABNORMAL HIGH (ref 70–99)
Potassium: 3.7 mmol/L (ref 3.5–5.1)
Sodium: 137 mmol/L (ref 135–145)

## 2019-06-27 LAB — RESPIRATORY PANEL BY RT PCR (FLU A&B, COVID)
Influenza A by PCR: NEGATIVE
Influenza B by PCR: NEGATIVE
SARS Coronavirus 2 by RT PCR: NEGATIVE

## 2019-06-27 LAB — PROTIME-INR
INR: 1.1 (ref 0.8–1.2)
Prothrombin Time: 14.1 seconds (ref 11.4–15.2)

## 2019-06-27 LAB — PLATELET INHIBITION P2Y12: Platelet Function  P2Y12: 61 [PRU] — ABNORMAL LOW (ref 182–335)

## 2019-06-27 LAB — GLUCOSE, CAPILLARY
Glucose-Capillary: 191 mg/dL — ABNORMAL HIGH (ref 70–99)
Glucose-Capillary: 202 mg/dL — ABNORMAL HIGH (ref 70–99)

## 2019-06-27 SURGERY — IR WITH ANESTHESIA
Anesthesia: General

## 2019-06-27 MED ORDER — IOHEXOL 350 MG/ML SOLN
75.0000 mL | Freq: Once | INTRAVENOUS | Status: AC | PRN
  Administered 2019-06-27: 75 mL via INTRAVENOUS

## 2019-06-27 MED ORDER — SODIUM CHLORIDE 0.9 % IV SOLN: INTRAVENOUS | Status: DC

## 2019-06-27 MED ORDER — NIMODIPINE 30 MG PO CAPS: 0.0000 mg | ORAL_CAPSULE | ORAL | Status: DC

## 2019-06-27 MED ORDER — CEFAZOLIN SODIUM-DEXTROSE 2-4 GM/100ML-% IV SOLN
2.0000 g | INTRAVENOUS | Status: DC
  Filled 2019-06-27: qty 100

## 2019-06-27 MED ORDER — ASPIRIN EC 325 MG PO TBEC
325.0000 mg | DELAYED_RELEASE_TABLET | ORAL | Status: DC
  Filled 2019-06-27: qty 1

## 2019-06-27 MED ORDER — NITROGLYCERIN 1 MG/10 ML FOR IR/CATH LAB
INTRA_ARTERIAL | Status: AC
  Filled 2019-06-27: qty 10

## 2019-06-27 NOTE — H&P (Signed)
Chief Complaint: Patient was seen in consultation today for cerebral arteriogram with possible Left middle cerebral artery angioplasty/stent placement at the request of [name]   Supervising Physician: Luanne Bras  Patient Status: Rachel Santos - Out-pt  History of Present Illness: Rachel Santos is a 79 y.o. 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 MRA revealed multifocal acute ischemia scattered within both hemispheres, L>R, and in the left cerebellum - most consistent with central embolic source; no emergent large vessel occlusion or high-grade stenosis   CTA head 04/22/19 noted atherosclerotic disease in the cavernous carotid bilaterally, short segmental occlusion left M2 branch, moderate stenosis left P2 and mild stenosis right P2  Cerebral arteriogram 05/21/19:  Severe proximal segmental narrowing of the central codominant branch in the left MCA trifurcation most likely secondary to intracranial arteriosclerosis. Approximately 40% stenosis of the proximal basilar artery. Approximately 50% narrowing of the right posterior cerebral artery distal P2 segment. Severe focal narrowing of the left posterior cerebral P2 P3 junction. 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  Consulted with Dr Estanislado Pandy 05/23/19 Scheduled for left middle cerebral artery angioplasty/stent today in IR with Dr Estanislado Pandy  Past Medical History:  Diagnosis Date  . Acute blood loss  anemia, transfused 1 unit PRBC 01/26/19 01/27/2019  . Anxiety   . Arthritis    knees  . Atrial fibrillation ([place])   . Cancer ([place])    left breast  . Cataracts, bilateral   . Depression   . Diabetes mellitus   . Dyspnea    with exertion  . Full dentures   . GERD (gastroesophageal reflux disease)   . Hearing aid worn    B/L  . History of TIA (transient ischemic attack)    mini stroke with vision problems  . Hypertension   . Middle cerebral artery stenosis, left   . Mitral regurgitation   . Pneumonia   . PONV (postoperative nausea and vomiting)   . S/P Maze operation for atrial fibrillation 10/11/2012   Complete bilateral atrial lesion set using bipolar radiofrequency and cryothermy ablation with clipping of LA appendage  . 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   14mm Sorin Memo 3D ring annuloplasty with 26 mm Edwards mc3 tricuspid ring annuloplasty   . 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 Thyroid nodule   . Tricuspid regurgitation   . Wears glasses     Past Surgical History:  Procedure Laterality Date  . ARTERY REPAIR Right 01/22/2019   Procedure: Right Common Femoral Artery Repair;  Surgeon: Angelia Mould, MD;  Location: [place];  Service: Vascular;  Laterality: Right;  . CARDIAC CATHETERIZATION  09-05-12  . CARDIOVERSION N/A 07/31/2012   Procedure: CARDIOVERSION;  Surgeon: Josue Hector, MD;  Location: [place];  Service: Cardiovascular;  Laterality: N/A;  . CATARACT EXTRACTION W/ INTRAOCULAR LENS  IMPLANT, BILATERAL    . CESAREAN SECTION  x 2  . COLONOSCOPY WITH PROPOFOL N/A 11/28/2014   Procedure: COLONOSCOPY WITH PROPOFOL;  Surgeon: Carol Ada, MD;  Location: [facility];  Service: Endoscopy;  Laterality: N/A;  . DILATION AND CURETTAGE OF UTERUS    . FEMORAL ARTERY EXPLORATION Right 01/22/2019   Procedure: RIGHT GROIN EXPLORATION with evacuation of hematoma;   Surgeon: Angelia Mould, MD;  Location[facility];  Service: Vascular;  Laterality: Right;  . FLEXIBLE SIGMOIDOSCOPY N/A 12/18/2015   Procedure: FLEXIBLE SIGMOIDOSCOPY;  Surgeon: Carol Ada, MD;  Location: [facility];  Service: Endoscopy;  Laterality: N/A;  . INTRAOPERATIVE TRANSESOPHAGEAL ECHOCARDIOGRAM N/A 10/11/2012   Procedure: INTRAOPERATIVE TRANSESOPHAGEAL ECHOCARDIOGRAM;  Surgeon: Rexene Alberts, MD;  Location: [place];  Service: Open Heart Surgery;  Laterality: N/A;  . IR ANGIO INTRA EXTRACRAN SEL COM CAROTID INNOMINATE BILAT MOD SED  05/21/2019  . IR ANGIO VERTEBRAL SEL VERTEBRAL BILAT MOD SED  05/21/2019  . MASTECTOMY Left 1982  . MAZE N/A 10/11/2012   Procedure: MAZE;  Surgeon: Rexene Alberts, MD;  Location: [place];  Service: Open Heart Surgery;  Laterality: N/A;  . MITRAL VALVE REPAIR N/A 10/11/2012   Procedure: MITRAL VALVE REPAIR (MVR);  Surgeon: Rexene Alberts, MD;  Location: [place];  Service: Open Heart Surgery;  Laterality: N/A;  . MULTIPLE TOOTH EXTRACTIONS    . RADIOLOGY WITH ANESTHESIA N/A 06/19/2019   Procedure: STENTING;  Surgeon: Luanne Bras, MD;  Location: [place];  Service: Radiology;  Laterality: N/A;  . RIGHT HEART CATH AND CORONARY ANGIOGRAPHY N/A 12/24/2018   Procedure: RIGHT HEART CATH AND CORONARY ANGIOGRAPHY;  Surgeon: [name], MD;  Location: [place] [facility];  Service: Cardiovascular;  Laterality: N/A;  . TEE WITHOUT CARDIOVERSION N/A 07/31/2012   Procedure: TRANSESOPHAGEAL ECHOCARDIOGRAM (TEE);  Surgeon: Josue Hector, MD;  Location: [place];  Service: Cardiovascular;  Laterality: N/A;  . TEE WITHOUT CARDIOVERSION N/A 01/22/2019   Procedure: TRANSESOPHAGEAL ECHOCARDIOGRAM (TEE);  Surgeon: [name], MD;  Location: [place] [facility];  Service: Open Heart Surgery;  Laterality: N/A;  . TRANSCATHETER AORTIC VALVE REPLACEMENT, TRANSFEMORAL N/A 01/22/2019   Procedure: TRANSCATHETER AORTIC VALVE REPLACEMENT, TRANSFEMORAL;   Surgeon: [name], MD;  Location: [place] [facility];  Service: Open Heart Surgery;  Laterality: N/A;  . TRICUSPID VALVE REPLACEMENT N/A 10/11/2012   Procedure: TRICUSPID VALVE REPAIR;  Surgeon: Rexene Alberts, MD;  Location: [place];  Service: Open Heart Surgery;  Laterality: N/A;  . TUBAL LIGATION      Allergies: Metformin and related, Ultram [tramadol], Nickel, and Pneumococcal vaccines  Medications: Prior to Admission medications   Medication Sig Start Date End Date Taking? Authorizing Provider  aspirin EC 81 MG tablet Take 81 mg by mouth daily.   Yes [provider]  atorvastatin (LIPITOR) 40 MG tablet Take 40 mg by mouth at bedtime.    Yes [provider]  busPIRone (BUSPAR) 10 MG tablet Take 5 mg by mouth 2 (two) times daily.    Yes [provider]  Calcium Carb-Cholecalciferol (CALCIUM PLUS VITAMIN D3) 600-500 MG-UNIT CAPS Take 1 tablet by mouth 2 (two) times daily.   Yes [provider]  Cyanocobalamin (VITAMIN B-12 PO) Take 1 tablet by mouth daily.   Yes [provider]  enoxaparin (LOVENOX) 60 MG/0.6ML injection Inject 60 mg into the skin daily. 06/12/19  Yes [provider]  Insulin Detemir (LEVEMIR FLEXPEN) 100 UNIT/ML Pen Inject 33 Units into the skin daily.    Yes [provider]  Multiple Vitamins-Minerals (MULTIVITAMIN WITH MINERALS) tablet Take 1 tablet by mouth daily.   Yes [provider]  omeprazole (PRILOSEC) 40 MG capsule Take 40 mg by mouth daily before breakfast.  06/28/12  Yes [provider]  Semaglutide,0.25 or 0.5MG /DOS, (OZEMPIC, 0.25 OR 0.5 MG/DOSE,) 2 MG/1.5ML SOPN Inject 0.5 mg into the skin every Monday.    Yes [provider]  ticagrelor (BRILINTA) 90 MG TABS tablet Take by mouth 2 (two) times daily.   Yes [provider]  venlafaxine XR (EFFEXOR-XR) 150 MG 24 hr capsule Take 150 mg by mouth daily with breakfast. Take with 75 mg capsule to equal 225 mg  daily   Yes [provider]  venlafaxine XR (EFFEXOR-XR) 75 MG 24 hr capsule Take 75 mg by mouth daily with breakfast. Take with 150 mg capsule to equal 225 mg daily   Yes [provider]  acetaminophen (TYLENOL) 500 MG tablet Take 1,000 mg by mouth every 6 (six) hours as needed for moderate pain or headache.    [provider]  magnesium hydroxide (MILK OF MAGNESIA) 400 MG/5ML suspension Take 30 mLs by mouth daily as needed for indigestion.     [provider]  Rivaroxaban (XARELTO) 15 MG TABS tablet Take 15 mg by mouth daily with supper.    [provider]     Family History  Problem Relation Age of Onset  . Alcohol abuse Mother   . Cancer Father        prostate  . Hypertension Father   . Heart attack Paternal Grandfather   . Stroke Neg Hx     Social History   Socioeconomic History  . Marital status: Widowed    Spouse name: Not on file  . Number of children: 3  . Years of education: Not on file  . Highest education level: Not on file  Occupational History  . Occupation: Air cabin crew  Tobacco Use  . Smoking status: Never Smoker  . Smokeless tobacco: Never Used  Substance and Sexual Activity  . Alcohol use: No  . Drug use: No  . Sexual activity: Never  Other Topics Concern  . Not on file  Social History Narrative  . Not on file   Social Determinants of Health   Financial Resource Strain:   . Difficulty of Paying Living Expenses:   Food Insecurity:   . Worried About Charity fundraiser in the Last Year:   . Arboriculturist in the Last Year:   Transportation Needs:   . Film/video editor (Medical):   Marland Kitchen Lack of Transportation (Non-Medical):   Physical Activity:   . Days of Exercise per Week:   . Minutes of Exercise per Session:   Stress:   . Feeling of Stress :   Social Connections:   . Frequency of Communication with Friends and Family:   . Frequency of Social Gatherings with Friends and  Family:   . Attends Religious Services:   . Active Member of Clubs or Organizations:   . Attends Archivist Meetings:   Marland Kitchen Marital Status:     Review of Systems: A 12 point ROS discussed and pertinent positives are indicated in the HPI above.  All other systems are negative.  Review of Systems  Constitutional: Positive for fatigue. Negative for activity change, fever and unexpected weight change.  HENT: Negative for hearing loss, tinnitus, trouble swallowing and voice change.   Eyes: Negative for visual disturbance.  Respiratory: Negative  for cough and shortness of breath.   Cardiovascular: Negative for chest pain.  Gastrointestinal: Negative for abdominal pain.  Musculoskeletal: Negative for back pain.  Neurological: Positive for dizziness and light-headedness. Negative for tremors, seizures, syncope, facial asymmetry, speech difficulty, weakness, numbness and headaches.  Psychiatric/Behavioral: Negative for behavioral problems and confusion.    Vital Signs: BP (!) 121/54   Pulse 60   Temp 98.1 F (36.7 C) (Oral)   Resp 16   Ht 5\' 6"  (1.676 m)   Wt 145 lb (65.8 kg)   BMI 23.40 kg/m   Physical Exam Vitals reviewed.  Constitutional:      Comments: Face symmetrical Tongue midline  HENT:     Head: Atraumatic.     Mouth/Throat:     Mouth: Mucous membranes are moist.  Eyes:     Extraocular Movements: Extraocular movements intact.  Cardiovascular:     Rate and Rhythm: Normal rate and regular rhythm.     Heart sounds: Normal heart sounds.  Pulmonary:     Effort: Pulmonary effort is normal.     Breath sounds: Normal breath sounds.  Abdominal:     Palpations: Abdomen is soft.     Tenderness: There is no abdominal tenderness.  Musculoskeletal:        General: Normal range of motion.     Cervical back: Normal range of motion.     Right lower leg: No edema.     Left lower leg: No edema.  Skin:    General: Skin is warm.     Coloration: Skin is not jaundiced.    Neurological:     Mental Status: She is alert and oriented to person, place, and time.  Psychiatric:        Behavior: Behavior normal.        Thought Content: Thought content normal.        Judgment: Judgment normal.     Imaging: No results found.  Labs:  CBC: Recent Labs    01/28/19 0927 01/28/19 0927 05/21/19 0846 05/21/19 1019 05/27/19 1421 06/19/19 0628  WBC 7.9  --  6.5  --  11.3* 7.4  HGB 9.0*   < > 13.0 13.3 12.3 12.9  HCT 26.7*   < > 38.9 39.0 37.0 38.9  PLT 143*  --  180  --  250 210   < > = values in this interval not displayed.    COAGS: Recent Labs    11/29/18 1625 01/18/19 0956 01/22/19 0728 01/24/19 0346 05/21/19 1038 06/19/19 0628  INR 2.0*  --  1.1  --  1.1 1.1  APTT  --  42*  --  73* 44* 43*    BMP: Recent Labs    01/25/19 0413 01/25/19 0413 01/26/19 0402 01/28/19 0927 05/21/19 1019 06/19/19 0628  NA 138   < > 137 132* 139 139  K 3.8   < > 3.9 4.1 4.8 3.9  CL 108   < > 106 99 102 103  CO2 23  --  24 23  --  26  GLUCOSE 132*   < > 135* 252* 144* 130*  BUN 13   < > 13 16 34* 30*  CALCIUM 8.1*  --  8.0* 8.3*  --  9.2  CREATININE 0.72   < > 0.87 0.75 0.90 1.09*  GFRNONAA >60  --  >60 >60  --  48*  GFRAA >60  --  >60 >60  --  56*   < > = values in this interval not  displayed.    LIVER FUNCTION TESTS: Recent Labs    11/07/18 1314 01/18/19 0956  BILITOT 0.4 0.6  AST 26 24  ALT 22 21  ALKPHOS 55 58  PROT 6.2* 6.6  ALBUMIN 3.6 4.0    TUMOR MARKERS: No results for input(s): AFPTM, CEA, CA199, CHROMGRNA in the last 8760 hours.  Assessment and Plan:  L MCA stenosis Scheduled for angioplasty./stent today She has been off Xarelto x 2 days-- bridged with Lovenox 60 mg (BID yesterday and 1 dose this am) Risks and benefits of cerebral angiogram with intervention were discussed with the patient including, but not limited to bleeding, infection, vascular injury, contrast induced renal failure, stroke or even death.  This  interventional procedure involves the use of X-rays and because of the nature of the planned procedure, it is possible that we will have prolonged use of X-ray fluoroscopy.  Potential radiation risks to you include (but are not limited to) the following: - A slightly elevated risk for cancer  several years later in life. This risk is typically less than 0.5% percent. This risk is low in comparison to the normal incidence of human cancer, which is 33% for women and 50% for men according to the [employer]. - Radiation induced injury can include skin redness, resembling a rash, tissue breakdown / ulcers and hair loss (which can be temporary or permanent).   The likelihood of either of these occurring depends on the difficulty of the procedure and whether you are sensitive to radiation due to previous procedures, disease, or genetic conditions.   IF your procedure requires a prolonged use of radiation, you will be notified and given written instructions for further action.  It is your responsibility to monitor the irradiated area for the 2 weeks following the procedure and to notify your physician if you are concerned that you have suffered a radiation induced injury.    All of the patient's questions were answered, patient is agreeable to proceed.  Consent signed and in chart.  Pt is aware if intervention is performed she will be admitted to Neuro ICU for overnight observation She is agreeable  Thank you for this interesting consult.  I greatly enjoyed meeting Donyea Wilensky Rudell and look forward to participating in their care.  A copy of this report was sent to the requesting provider on this date.  Electronically Signed: Lavonia Drafts, PA-C 06/27/2019, 7:31 AM   I spent a total of  30 Minutes   in face to face in clinical consultation, greater than 50% of which was counseling/coordinating care for L MCA angioplasty/stent

## 2019-06-27 NOTE — Anesthesia Preprocedure Evaluation (Deleted)
Anesthesia Evaluation  Patient identified by MRN, date of birth, ID band Patient awake    Reviewed: Allergy & Precautions, NPO status , Patient's Chart, lab work & pertinent test results  History of Anesthesia Complications (+) PONV and history of anesthetic complications  Airway Mallampati: II  TM Distance: >3 FB Neck ROM: Full    Dental  (+) Edentulous Upper, Edentulous Lower   Pulmonary neg pulmonary ROS,    Pulmonary exam normal        Cardiovascular hypertension, Normal cardiovascular exam  TTE 03/04/19: EF 65-70%, mild LVH, grade II  diastolic dysfunction, mild LAE, mild MS, mild TR   Hx of TAVR   Neuro/Psych Anxiety Depression CVA    GI/Hepatic Neg liver ROS, GERD  Medicated and Controlled,  Endo/Other  diabetes, Type 2, Insulin Dependent  Renal/GU negative Renal ROS  negative genitourinary   Musculoskeletal  (+) Arthritis ,   Abdominal   Peds  Hematology negative hematology ROS (+)   Anesthesia Other Findings Day of surgery medications reviewed with patient.  Reproductive/Obstetrics negative OB ROS                             Lab Results  Component Value Date   WBC 6.1 06/27/2019   HGB 13.4 06/27/2019   HCT 41.2 06/27/2019   MCV 94.7 06/27/2019   PLT 204 06/27/2019   Lab Results  Component Value Date   CREATININE 1.09 (H) 06/19/2019   BUN 30 (H) 06/19/2019   NA 139 06/19/2019   K 3.9 06/19/2019   CL 103 06/19/2019   CO2 26 06/19/2019    Anesthesia Physical  Anesthesia Plan  ASA: III  Anesthesia Plan: General   Post-op Pain Management:    Induction: Intravenous  PONV Risk Score and Plan: 4 or greater and Treatment may vary due to age or medical condition, Ondansetron and Dexamethasone  Airway Management Planned: Oral ETT  Additional Equipment: Arterial line  Intra-op Plan:   Post-operative Plan: Extubation in OR  Informed Consent: I have reviewed the  patients History and Physical, chart, labs and discussed the procedure including the risks, benefits and alternatives for the proposed anesthesia with the patient or authorized representative who has indicated his/her understanding and acceptance.     Dental advisory given  Plan Discussed with: CRNA  Anesthesia Plan Comments: (Follows with cardiology for hx of  HTN, mild CAD, severe aortic stenosis s/p TAVR, mitral valve disease s/p mitral valve surgery, tricuspid valve disease s/p tricuspid valve surgery and paroxysmal atrial fibrillation. Recently seen by Dr. Angelena Form 05/27/18 and doing well. Per note, "Severe Aortic stenosiss/p TAVR: Doing well post TAVR (01/22/19). Mild PVL by echo December 2020 with mildly elevated gradients. Continue ASA and Xarelto. Mitral regurgitation: She is s/p MV ring in 2014, stable by echo 2020. Tricuspid regurgitation: She is s/p TV ring in 2014. Stable by echo in 2020. CAD without angina: Mild CAD by cath in 2020. She has no chest pain. Continue statin.  Atrial fibrillation, paroxysmal: sinus today. Continue Xarelto.  History of stroke: Planned cerebral angiogram per Int Radiology. Her Xarelto is being recommended by Neurology post stroke. I would suspect that she will need a Lovenox bridge prior to any procedures if Xarelto needed to be held. She could hold Xarelto purely from her cardiac indication (atrial fib)."  Patient was noted to have a large right groin hematoma at the time of her TAVR which required surgical exploration.  Also at the  time of these interventions, her blood thinners were held and she suffered a L > R parietal and left cerebellar infarct.  Cerebral angiogram performed 05/21/19 notable for severe left PCA distal P2 stenosis due to ICAD, approximately 40% stenosis of the proximal basilar artery, approximately 30% stenosis of the right ICA distal cavernous segment and severe stenosis of the prominent parietal branch of the inferior division of the left  MCA.   Pt is on Lovenox bridge per IR.  Will need DOS labs and eval.  EKG 01/23/19: Junctional rhythm. Rate 61  IR Angio 05/21/19: IMPRESSION: Severe proximal segmental narrowing of the central codominant branch in the left MCA trifurcation most likely secondary to intracranial arteriosclerosis.  Approximately 40% stenosis of the proximal basilar artery.  Approximately 50% narrowing of the right posterior cerebral artery distal P2 segment.  Severe focal narrowing of the left posterior cerebral P2 P3 junction. 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  TTE 03/04/19: 1. Left ventricular ejection fraction, by visual estimation, is 65 to  70%. The left ventricle has hyperdynamic function. There is mildly  increased left ventricular hypertrophy.  2. Elevated left ventricular end-diastolic pressure.  3. Left ventricular diastolic parameters are consistent with Grade II  diastolic dysfunction (pseudonormalization).  4. Global right ventricle has normal systolic function.The right  ventricular size is normal. No increase in right ventricular wall  thickness.  5. Left atrial size was mildly dilated.  6. Right atrial size was normal.  7. Moderate calcification of the mitral valve leaflet(s).  8. Moderate thickening of the mitral valve leaflet(s).  9. Severe mitral annular calcification.  10. The mitral valve is normal in structure. No evidence of mitral valve  regurgitation. Mild mitral stenosis.  11. The tricuspid valve is normal in structure. Tricuspid valve  regurgitation is mild.  12. Aortic valve regurgitation is not visualized. No evidence of aortic  valve sclerosis or stenosis.  13. The pulmonic valve was normal in structure. Pulmonic valve  regurgitation is trivial.  14. The inferior vena cava is normal in size with greater than 50%  respiratory variability, suggesting  right atrial pressure of 3 mmHg.  15. 23 mm Edwards SAPIEN 3 Ultra valve has peak/mean gradients 21/13 mmHg,  stable mild paravalvular leak. AVA 1.4 cm2.   Cath 12/24/18 (pre AVR) :   1. Mild non-obstructive disease in the LAD and Circumflex 2. Moderate non-obstructive disease in the mid and distal RCA, PDA.  3. Severe aortic stenosis by echo. Unable to cross the aortic valve in the cath lab from the radial approach.  )        Anesthesia Quick Evaluation

## 2019-06-27 NOTE — Progress Notes (Addendum)
  Dr. Estanislado Pandy had a long discussion with both daughters.  Since Ms. Ruppert is completely asymptomatic on Brilinta, risks outweigh benefit of cerebral intervention at this time.  Will obtain CTA to evaluate cerebral vasculature.  She is to continue her current medication regimen today only, then tomorrow, she is going to stop her aspirin and start Brilinta 90 mg BID. She will resume her Xarelto tomorrow.  Written instructions given.  Eliabeth Shoff S Jaimarie Rapozo PA-C 06/27/2019 9:51 AM

## 2019-06-27 NOTE — Progress Notes (Signed)
Patient ID: Rachel Santos, female   DOB: [birth year], 79 y.o.   MRN: [id number] Patient presenting for diagnostic arteriogram with intent to treat symptomatic stenosis of LT MCA inf division stenosis. According to patients daughters the patient has been asymptomatic since starting brilinta 90 mg BID ,and aspirin 81 mg per day. PRU platelet inhibition  Is 61. Considering the risk benefit ratio of  the procedure, with the patient being asymptomatic a follow up CTA of the head and neck will be obtained today. Patient to take aspirin 81 mg and brilinta 90 mg today . To continue brilinta 90 mg BIDstarting tomorrow and restart Xarelto. Stop aspirin tomorrow.. DW both daughters who expressed understanding of the plan. S.Mandela Bello MD

## 2019-06-28 ENCOUNTER — Other Ambulatory Visit (HOSPITAL_COMMUNITY): Payer: Self-pay | Admitting: Radiology

## 2019-06-28 ENCOUNTER — Telehealth: Payer: Self-pay | Admitting: Student

## 2019-06-28 NOTE — Telephone Encounter (Signed)
NIR.  Dr. Estanislado Pandy called patient's daughter, [name], today at 69, per Carla's request.   Reviewed CTA results- proximal left MCA M2 near occlusive stenosis, however vessel still has flow past stenosis (not completely blocked). Recommend continued conservative management given asymptomatic at this time- routine imaging scans to monitor for changes.  Discussed anticoagulation use. P2Y12 61 PRU- explained this is within acceptable limits per Dr. Estanislado Pandy. Patient to continue taking Brilinta 90 mg twice daily. Patient to discontinue taking Aspirin today, and resume Xarelto today.  Other recommendations- manage diabetes, follow-up with cardiology.  All questions answered and concerns addressed. Patient's daughter conveys understanding and agrees with plan.   Bea Graff Elnora Quizon, PA-C 06/28/2019, 3:01 PM

## 2019-06-29 ENCOUNTER — Telehealth: Payer: Self-pay | Admitting: Internal Medicine

## 2019-06-29 NOTE — Telephone Encounter (Signed)
Ms. Crott's daughter called regarding the patient having a nose bleed while on brilinta and xarelto. She is followed primarily by vascular and neurology but was unable to get a hold of their office/staff. She states the bleeding is not significant but that it recurs when she blows her nose. Advised them to hold pressure for ~30 minutes without releasing and avoiding blowing her nose. If this is unsuccessful, can use Afrin twice a day for 3 days. Advised that if her bleeding does not stop, she should come to the ER for evaluation.  Alric Quan, MD

## 2019-07-15 NOTE — Progress Notes (Signed)
Cardiology Office Note    Date:  07/16/2019   ID:  TYRHIANNA Santos, DOB 1940/04/22, MRN [id number]  PCP:  Merrilee Seashore, MD  Cardiologist: Lauree Chandler, MD EPS: None  Chief Complaint  Patient presents with  . Follow-up  . Shortness of Breath    History of Present Illness:  Rachel Santos is a 79 y.o. 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 Unfortunately, her TAVR was complicated by an extensive right groin hematoma requiring surgical repair and blood transfusions as well as cardioembolic CVA. MRA showed multifocal acute ischemia scattered within both hemispheres, left-greater-than-right, and in the left cerebellum most consistent with central embolic source. She was restarted on Xarelto in addition to [place] . CBC and bmet stable 06/27/19. No bleeding problems   She was readmitted to [facility] from 11/9-11/12 with groin pain and recurrent aphagia. Her Hg was noted to be down to 7.4 and she was transfused. CT abdomen pelvis showed a right inguinal hematoma and small right pelvic hematoma. CT head showed no acute abnormality but MRI brain showed several small acute cortical-based infarcts in the left frontal, parietal and occipital lobes which may represent an embolic source.  CT angio of the head and neck showed severe narrowing of the left middle cerebral artery and moderate to severe narrowing of the left posterior cerebral artery.  She was evaluated by neurology and underwent full stroke work-up. Her stroke was felt to be most likely related to her intracranial atherosclerotic disease and now on Brilinta  added to her Xarelto.    Patient was last seen in the TAVR clinic 02/28/19 and doing better, some orthostatic hypotension wearing compression stockings.  Patient saw  Dr. Angelena Form 05/27/2019 and was complaining of weakness and palpitations so 14-day monitor was ordered and showed normal sinus rhythm with PACs.  No changes made.  Patient says any exertion her heart starts racing and she gets short of breath and has to sit down. Feels the worst in the am. Dizzy when she stands up. Not wearing compression stockings because she forgot. Sometimes stays up all night and then sleeps all day. BP running 130/70.   Past Medical History:  Diagnosis Date  . Acute blood loss anemia, transfused 1 unit PRBC 01/26/19 01/27/2019  . Anxiety   . Arthritis    knees  . Atrial fibrillation ([place])   . Cancer ([place])    left breast  . Cataracts, bilateral   . Depression   . Diabetes mellitus   . Dyspnea    with exertion  . Full dentures   . GERD (gastroesophageal reflux disease)   . Hearing aid worn    B/L  . History of TIA (transient ischemic attack)    mini stroke with vision problems  . Hypertension   . Middle cerebral artery stenosis, left   . Mitral regurgitation   . Pneumonia   . PONV (postoperative nausea and vomiting)   . S/P Maze operation for atrial fibrillation 10/11/2012   Complete bilateral atrial lesion set using bipolar radiofrequency and cryothermy ablation with clipping of LA appendage  . 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 Thyroid nodule   . Tricuspid regurgitation   . Wears glasses     Past Surgical History:  Procedure Laterality Date  . ARTERY REPAIR Right 01/22/2019   Procedure: Right Common Femoral Artery Repair;  Surgeon: Angelia Mould, MD;  Location: [place];  Service: Vascular;  Laterality: Right;  . CARDIAC CATHETERIZATION  09-05-12  . CARDIOVERSION N/A 07/31/2012   Procedure: CARDIOVERSION;  Surgeon: Josue Hector, MD;  Location: [place];  Service: Cardiovascular;  Laterality: N/A;  . CATARACT EXTRACTION W/ INTRAOCULAR LENS  IMPLANT, BILATERAL    . CESAREAN SECTION     x 2  . COLONOSCOPY WITH PROPOFOL N/A 11/28/2014   Procedure: COLONOSCOPY WITH PROPOFOL;  Surgeon: Carol Ada, MD;  Location: [facility];  Service: Endoscopy;  Laterality: N/A;  . DILATION AND CURETTAGE OF UTERUS    . FEMORAL ARTERY EXPLORATION Right 01/22/2019   Procedure: RIGHT GROIN EXPLORATION with evacuation of hematoma;  Surgeon: Angelia Mould, MD;  Location: [facility];  Service: Vascular;  Laterality: Right;  . FLEXIBLE SIGMOIDOSCOPY N/A 12/18/2015   Procedure: FLEXIBLE SIGMOIDOSCOPY;  Surgeon: Carol Ada, MD;  Location: [facility];  Service: Endoscopy;  Laterality: N/A;  . INTRAOPERATIVE TRANSESOPHAGEAL ECHOCARDIOGRAM N/A 10/11/2012   Procedure: INTRAOPERATIVE TRANSESOPHAGEAL ECHOCARDIOGRAM;  Surgeon: Rexene Alberts, MD;  Location: [place];  Service: Open Heart Surgery;  Laterality: N/A;  . IR ANGIO INTRA EXTRACRAN SEL COM CAROTID INNOMINATE BILAT MOD SED  05/21/2019  . IR ANGIO VERTEBRAL SEL VERTEBRAL BILAT MOD SED  05/21/2019  . MASTECTOMY Left 1982  . MAZE N/A 10/11/2012   Procedure: MAZE;  Surgeon: Rexene Alberts, MD;  Location: [place];  Service: Open Heart Surgery;  Laterality: N/A;  . MITRAL VALVE REPAIR N/A 10/11/2012   Procedure: MITRAL VALVE REPAIR (MVR);  Surgeon: Rexene Alberts, MD;  Location: [place];  Service: Open Heart Surgery;  Laterality: N/A;  . MULTIPLE TOOTH EXTRACTIONS    . RADIOLOGY WITH ANESTHESIA N/A 06/19/2019   Procedure: STENTING;  Surgeon: Luanne Bras, MD;  Location: [place];  Service: Radiology;  Laterality: N/A;  . RIGHT HEART CATH AND CORONARY ANGIOGRAPHY N/A 12/24/2018   Procedure: RIGHT HEART CATH AND CORONARY ANGIOGRAPHY;  Surgeon: [name], MD;  Location: [place] [facility];  Service: Cardiovascular;  Laterality: N/A;  . TEE WITHOUT CARDIOVERSION N/A 07/31/2012    Procedure: TRANSESOPHAGEAL ECHOCARDIOGRAM (TEE);  Surgeon: Josue Hector, MD;  Location: [place];  Service: Cardiovascular;  Laterality: N/A;  . TEE WITHOUT CARDIOVERSION N/A 01/22/2019   Procedure: TRANSESOPHAGEAL ECHOCARDIOGRAM (TEE);  Surgeon: [name], MD;  Location: [place] [facility];  Service: Open Heart Surgery;  Laterality: N/A;  . TRANSCATHETER AORTIC VALVE REPLACEMENT, TRANSFEMORAL N/A 01/22/2019   Procedure: TRANSCATHETER AORTIC VALVE REPLACEMENT, TRANSFEMORAL;  Surgeon: [name], MD;  Location: [place] [facility];  Service: Open Heart Surgery;  Laterality: N/A;  . TRICUSPID VALVE REPLACEMENT N/A 10/11/2012   Procedure: TRICUSPID VALVE REPAIR;  Surgeon: Rexene Alberts, MD;  Location: [place];  Service: Open Heart Surgery;  Laterality: N/A;  . TUBAL LIGATION      Current Medications: Current Meds  Medication Sig  . acetaminophen (TYLENOL) 500 MG tablet Take 1,000 mg by mouth every 6 (six) hours as needed for moderate pain or headache.  Marland Kitchen aspirin EC 81 MG tablet Take 81 mg by mouth daily.  Marland Kitchen atorvastatin (LIPITOR) 40 MG tablet  Take 40 mg by mouth at bedtime.   . busPIRone (BUSPAR) 10 MG tablet Take 5 mg by mouth 2 (two) times daily.   . Calcium Carb-Cholecalciferol (CALCIUM PLUS VITAMIN D3) 600-500 MG-UNIT CAPS Take 1 tablet by mouth 2 (two) times daily.  . Cyanocobalamin (VITAMIN B-12 PO) Take 1 tablet by mouth daily.  . Insulin Detemir (LEVEMIR FLEXPEN) 100 UNIT/ML Pen Inject 33 Units into the skin daily.   . magnesium hydroxide (MILK OF MAGNESIA) 400 MG/5ML suspension Take 30 mLs by mouth daily as needed for indigestion.   . Multiple Vitamins-Minerals (MULTIVITAMIN WITH MINERALS) tablet Take 1 tablet by mouth daily.  Marland Kitchen omeprazole (PRILOSEC) 40 MG capsule Take 40 mg by mouth daily before breakfast.   . Rivaroxaban (XARELTO) 15 MG TABS tablet Take 15 mg by mouth daily with supper.  . Semaglutide,0.25 or 0.5MG /DOS, (OZEMPIC, 0.25 OR 0.5 MG/DOSE,) 2  MG/1.5ML SOPN Inject 0.5 mg into the skin every Monday.   . ticagrelor (BRILINTA) 90 MG TABS tablet Take by mouth 2 (two) times daily.  Marland Kitchen venlafaxine XR (EFFEXOR-XR) 150 MG 24 hr capsule Take 150 mg by mouth daily with breakfast. Take with 75 mg capsule to equal 225 mg daily  . venlafaxine XR (EFFEXOR-XR) 75 MG 24 hr capsule Take 75 mg by mouth daily with breakfast. Take with 150 mg capsule to equal 225 mg daily     Allergies:   Metformin and related, Ultram [tramadol], Nickel, and Pneumococcal vaccines   Social History   Socioeconomic History  . Marital status: Widowed    Spouse name: Not on file  . Number of children: 3  . Years of education: Not on file  . Highest education level: Not on file  Occupational History  . Occupation: Air cabin crew  Tobacco Use  . Smoking status: Never Smoker  . Smokeless tobacco: Never Used  Substance and Sexual Activity  . Alcohol use: No  . Drug use: No  . Sexual activity: Never  Other Topics Concern  . Not on file  Social History Narrative  . Not on file   Social Determinants of Health   Financial Resource Strain:   . Difficulty of Paying Living Expenses:   Food Insecurity:   . Worried About Charity fundraiser in the Last Year:   . Arboriculturist in the Last Year:   Transportation Needs:   . Film/video editor (Medical):   Marland Kitchen Lack of Transportation (Non-Medical):   Physical Activity:   . Days of Exercise per Week:   . Minutes of Exercise per Session:   Stress:   . Feeling of Stress :   Social Connections:   . Frequency of Communication with Friends and Family:   . Frequency of Social Gatherings with Friends and Family:   . Attends Religious Services:   . Active Member of Clubs or Organizations:   . Attends Archivist Meetings:   Marland Kitchen Marital Status:      Family History:  The patient's family history includes Alcohol abuse in her mother; Cancer in her father; Heart attack in her paternal  grandfather; Hypertension in her father.   ROS:   Please see the history of present illness.    ROS All other systems reviewed and are negative.   PHYSICAL EXAM:   VS:  BP 140/72   Pulse 73   Ht 5' 6.5" (1.689 m)   Wt 137 lb 6.4 oz (62.3 kg)   SpO2 99%   BMI 21.84 kg/m  Physical Exam  GEN: Well nourished, well developed, in no acute distress  Neck: no JVD, carotid bruits, or masses Cardiac:RRR; no murmurs, rubs, or gallops  Respiratory:  clear to auscultation bilaterally, normal work of breathing GI: soft, nontender, nondistended, + BS Ext: without cyanosis, clubbing, or edema, Good distal pulses bilaterally Neuro:  Alert and Oriented x 3 Psych: euthymic mood, full affect  Wt Readings from Last 3 Encounters:  07/16/19 137 lb 6.4 oz (62.3 kg)  06/27/19 145 lb (65.8 kg)  06/19/19 145 lb (65.8 kg)      Studies/Labs Reviewed:   EKG:  EKG is not ordered today.   Recent Labs: 01/18/2019: ALT 21; B Natriuretic Peptide 67.9 01/23/2019: Magnesium 1.7 05/27/2019: TSH 4.380 06/27/2019: BUN 28; Creatinine, Ser 1.10; Hemoglobin 13.4; Platelets 204; Potassium 3.7; Sodium 137   Lipid Panel    Component Value Date/Time   CHOL 112 01/23/2019 1530   TRIG 149 01/23/2019 1530   HDL 38 (L) 01/23/2019 1530   CHOLHDL 2.9 01/23/2019 1530   VLDL 30 01/23/2019 1530   LDLCALC 44 01/23/2019 1530    Additional studies/ records that were reviewed today include:  CTA neck/head 06/27/19 IMPRESSION: CT head:   1. No evidence of acute intracranial abnormality. 2. Redemonstrated small chronic lacunar infarct within the right cerebellum. 3. Known tiny scattered chronic infarcts in the bilateral cerebral hemispheres and left cerebellum are poorly appreciated by CT.   CTA neck:   1. Atherosclerotic plaque within the visualized aortic arch and major branch vessels of the neck as described. 2. The bilateral common carotid, internal carotid and vertebral arteries are patent within without  significant stenosis. 3. 18 mm nodule within the inferior left thyroid lobe. If not already performed, thyroid ultrasound is recommended for further evaluation.   CTA head:   1. Unchanged appearance of the intracranial arterial vasculature as compared to CTA head 04/22/2019. 2. Short segment near occlusive or occlusive stenosis of a proximal left M2 MCA branch. 3. Calcified plaque within the cavernous and paraclinoid ICAs. Sites of moderate stenosis on the right and mild stenosis on the left. 4. Focal atherosclerotic narrowing of the proximal basilar artery estimated at up to 50%. 5. Multifocal moderate/severe stenoses within the P2 left posterior cerebral artery. 6. Mild stenosis within the P2 right posterior cerebral artery and V3/V4 vertebral arteries.     Electronically Signed   By: Kellie Simmering DO   On: 06/27/2019 11:53  Echo 01/23/19: IMPRESSIONS  1. Left ventricular ejection fraction, by visual estimation, is 65 to 70%. The left ventricle has normal function. Normal left ventricular size. There is no left ventricular hypertrophy.  2. Left ventricular diastolic Doppler parameters are consistent with impaired relaxation pattern of LV diastolic filling.  3. Global right ventricle has normal systolic function.The right ventricular size is normal. No increase in right ventricular wall thickness.  4. Left atrial size was normal.  5. Right atrial size was normal.  6. Moderate mitral annular calcification.  7. The mitral valve is normal in structure. Mild mitral valve regurgitation. Mild mitral stenosis.  8. The tricuspid valve is normal in structure. Tricuspid valve regurgitation is trivial.  9. Aortic valve regurgitation is mild by color flow Doppler. Mild aortic valve stenosis. 10. Mild perivalvular regurgitation. 11. The pulmonic valve was normal in structure. Pulmonic valve regurgitation is not visualized by color flow Doppler. 12. Normal pulmonary artery systolic  pressure. 13. The inferior vena cava is normal in size with greater than 50% respiratory variability, suggesting right atrial  pressure of 3 mmHg.   _________________     01/24/19 MRI IMPRESSION: 1. Multifocal acute ischemia scattered within both hemispheres, left-greater-than-right, and in the left cerebellum. The pattern is most consistent with a central embolic source, particularly in a patient with a history of atrial fibrillation. 2. No emergent large vessel occlusion or high-grade stenosis. 3. Mild intracranial atherosclerosis.   _________________   Echo 02/28/19 IMPRESSIONS  1. Left ventricular ejection fraction, by visual estimation, is 65 to 70%. The left ventricle has hyperdynamic function. There is mildly increased left ventricular hypertrophy.  2. Elevated left ventricular end-diastolic pressure.  3. Left ventricular diastolic parameters are consistent with Grade II diastolic dysfunction (pseudonormalization).  4. Global right ventricle has normal systolic function.The right ventricular size is normal. No increase in right ventricular wall thickness.  5. Left atrial size was mildly dilated.  6. Right atrial size was normal.  7. Moderate calcification of the mitral valve leaflet(s).  8. Moderate thickening of the mitral valve leaflet(s).  9. Severe mitral annular calcification. 10. The mitral valve is normal in structure. No evidence of mitral valve regurgitation. Mild mitral stenosis. 11. The tricuspid valve is normal in structure. Tricuspid valve regurgitation is mild. 12. Aortic valve regurgitation is not visualized. No evidence of aortic valve sclerosis or stenosis. 13. The pulmonic valve was normal in structure. Pulmonic valve regurgitation is trivial. 14. The inferior vena cava is normal in size with greater than 50% respiratory variability, suggesting right atrial pressure of 3 mmHg. 15. 23 mm Edwards SAPIEN 3 Ultra valve has peak/mean gradients 21/13 mmHg, stable  mild paravalvular leak. AVA 1.4 cm2.      ASSESSMENT:    1. S/P TAVR (transcatheter aortic valve replacement)   2. Essential hypertension   3. Paroxysmal atrial fibrillation ([facility])   4. S/P mitral valve repair   5. Recurrent cerebrovascular accidents (CVAs) ([place])   6. Palpitations      PLAN:  In order of problems listed above:  Severe AS status post TAVR complicated by right groin hematoma requiring surgery and cardioembolic CVA. follow-up echo 02/2019 normal LVEF 65% mean gradient 13 mmHg.  Does need SBE prophylaxis.  On Xarelto and aspirin given recurrent CVAs and severe intracranial stenosis per neuro  Essential hypertension with history of orthostatic hypotension can't add any meds-order compression stockings.  PAF on Xarelto-complains of fast HR with activity but holter monitor stable with PAC's. Can't uses beta blocker at this time with orthostatic symptoms.  History of mitral/tricuspid valve ring with maze 2014   Recurrent CVA recent CTA of head neck 06/27/2019 see above followed by neuro  Recent palpitations-monitor showed NSR with PAC's  Medication Adjustments/Labs and Tests Ordered: Current medicines are reviewed at length with the patient today.  Concerns regarding medicines are outlined above.  Medication changes, Labs and Tests ordered today are listed in the Patient Instructions below. Patient Instructions  Medication Instructions:  Your physician recommends that you continue on your current medications as directed. Please refer to the Current Medication list given to you today.  *If you need a refill on your cardiac medications before your next appointment, please call your pharmacy*   Lab Work: None If you have labs (blood work) drawn today and your tests are completely normal, you will receive your results only by: Marland Kitchen MyChart Message (if you have MyChart) OR . A paper copy in the mail If you have any lab test that is abnormal or we need to change your  treatment, we will call you to  review the results.   Testing/Procedures: None   Follow-Up: With Dr. Angelena Form on 11/20/2019 @ 11:20  At [facility], you and your health needs are our priority.  As part of our continuing mission to provide you with exceptional heart care, we have created designated Provider Care Teams.  These Care Teams include your primary Cardiologist (physician) and Advanced Practice Providers (APPs -  Physician Assistants and Nurse Practitioners) who all work together to provide you with the care you need, when you need it.    Other Instructions Wear compression stockings to help with Orthostatic Hypotension     Signed, Ermalinda Barrios, PA-C  07/16/2019 1:05 PM    [facility] [address], [place], [place]  [address] Phone: [phone]; Fax: [phone]

## 2019-07-16 ENCOUNTER — Encounter: Payer: Self-pay | Admitting: Physician Assistant

## 2019-07-16 ENCOUNTER — Ambulatory Visit (INDEPENDENT_AMBULATORY_CARE_PROVIDER_SITE_OTHER): Payer: Medicare Other | Admitting: Physician Assistant

## 2019-07-16 ENCOUNTER — Other Ambulatory Visit: Payer: Self-pay

## 2019-07-16 VITALS — BP 140/72 | HR 73 | Ht 66.5 in | Wt 137.4 lb

## 2019-07-16 DIAGNOSIS — Z952 Presence of prosthetic heart valve: Secondary | ICD-10-CM | POA: Diagnosis not present

## 2019-07-16 DIAGNOSIS — I480 Paroxysmal atrial fibrillation: Secondary | ICD-10-CM

## 2019-07-16 DIAGNOSIS — Z9889 Other specified postprocedural states: Secondary | ICD-10-CM | POA: Diagnosis not present

## 2019-07-16 DIAGNOSIS — I639 Cerebral infarction, unspecified: Secondary | ICD-10-CM

## 2019-07-16 DIAGNOSIS — I10 Essential (primary) hypertension: Secondary | ICD-10-CM

## 2019-07-16 DIAGNOSIS — R002 Palpitations: Secondary | ICD-10-CM

## 2019-07-16 NOTE — Telephone Encounter (Signed)
Patient seen in the office today. Will defer these labs to PCP.

## 2019-07-16 NOTE — Patient Instructions (Signed)
Medication Instructions:  Your physician recommends that you continue on your current medications as directed. Please refer to the Current Medication list given to you today.  *If you need a refill on your cardiac medications before your next appointment, please call your pharmacy*   Lab Work: None If you have labs (blood work) drawn today and your tests are completely normal, you will receive your results only by: Marland Kitchen MyChart Message (if you have MyChart) OR . A paper copy in the mail If you have any lab test that is abnormal or we need to change your treatment, we will call you to review the results.   Testing/Procedures: None   Follow-Up: With Dr. Angelena Form on 11/20/2019 @ 11:20  At [facility], you and your health needs are our priority.  As part of our continuing mission to provide you with exceptional heart care, we have created designated Provider Care Teams.  These Care Teams include your primary Cardiologist (physician) and Advanced Practice Providers (APPs -  Physician Assistants and Nurse Practitioners) who all work together to provide you with the care you need, when you need it.    Other Instructions Wear compression stockings to help with Orthostatic Hypotension

## 2019-07-18 DIAGNOSIS — E1121 Type 2 diabetes mellitus with diabetic nephropathy: Secondary | ICD-10-CM | POA: Diagnosis not present

## 2019-07-18 DIAGNOSIS — Z794 Long term (current) use of insulin: Secondary | ICD-10-CM | POA: Diagnosis not present

## 2019-07-25 DIAGNOSIS — Z9289 Personal history of other medical treatment: Secondary | ICD-10-CM | POA: Diagnosis not present

## 2019-07-25 DIAGNOSIS — I4891 Unspecified atrial fibrillation: Secondary | ICD-10-CM | POA: Diagnosis not present

## 2019-07-25 DIAGNOSIS — E1065 Type 1 diabetes mellitus with hyperglycemia: Secondary | ICD-10-CM | POA: Diagnosis not present

## 2019-07-25 DIAGNOSIS — E782 Mixed hyperlipidemia: Secondary | ICD-10-CM | POA: Diagnosis not present

## 2019-07-25 DIAGNOSIS — Z8673 Personal history of transient ischemic attack (TIA), and cerebral infarction without residual deficits: Secondary | ICD-10-CM | POA: Diagnosis not present

## 2019-07-25 DIAGNOSIS — E1043 Type 1 diabetes mellitus with diabetic autonomic (poly)neuropathy: Secondary | ICD-10-CM | POA: Diagnosis not present

## 2019-08-01 DIAGNOSIS — E1043 Type 1 diabetes mellitus with diabetic autonomic (poly)neuropathy: Secondary | ICD-10-CM | POA: Diagnosis not present

## 2019-08-01 DIAGNOSIS — E782 Mixed hyperlipidemia: Secondary | ICD-10-CM | POA: Diagnosis not present

## 2019-08-01 DIAGNOSIS — E1065 Type 1 diabetes mellitus with hyperglycemia: Secondary | ICD-10-CM | POA: Diagnosis not present

## 2019-08-01 DIAGNOSIS — Z0000 Encounter for general adult medical examination without abnormal findings: Secondary | ICD-10-CM | POA: Diagnosis not present

## 2019-08-01 DIAGNOSIS — E1121 Type 2 diabetes mellitus with diabetic nephropathy: Secondary | ICD-10-CM | POA: Diagnosis not present

## 2019-08-15 DIAGNOSIS — C50912 Malignant neoplasm of unspecified site of left female breast: Secondary | ICD-10-CM | POA: Diagnosis not present

## 2019-09-02 ENCOUNTER — Telehealth: Payer: Self-pay | Admitting: Nurse Practitioner

## 2019-09-02 NOTE — Telephone Encounter (Signed)
   Pts dtr called this evening due to her mother developing a nose bleed ~ 1-2 hrs ago.  In the setting of PAF, strokes, and recent treatment for left MCA stenosis, she is on both xarelto and brilinta.  Pt has been holding pressure and per dtr, bleeding has slowed significantly.  I advised that if pts nose continues to bleed over the next hour, despite their best effort to stop it, she will likely need to be evaluated in the ED for further intervention and potentially cautery.  Caller verbalized understanding and was grateful for the call back.  Murray Hodgkins, NP 09/02/2019, 8:27 PM

## 2019-09-13 DIAGNOSIS — E1121 Type 2 diabetes mellitus with diabetic nephropathy: Secondary | ICD-10-CM | POA: Diagnosis not present

## 2019-09-13 DIAGNOSIS — R42 Dizziness and giddiness: Secondary | ICD-10-CM | POA: Diagnosis not present

## 2019-09-13 DIAGNOSIS — I129 Hypertensive chronic kidney disease with stage 1 through stage 4 chronic kidney disease, or unspecified chronic kidney disease: Secondary | ICD-10-CM | POA: Diagnosis not present

## 2019-09-13 DIAGNOSIS — E7800 Pure hypercholesterolemia, unspecified: Secondary | ICD-10-CM | POA: Diagnosis not present

## 2019-09-13 DIAGNOSIS — N1831 Chronic kidney disease, stage 3a: Secondary | ICD-10-CM | POA: Diagnosis not present

## 2019-09-19 DIAGNOSIS — E119 Type 2 diabetes mellitus without complications: Secondary | ICD-10-CM | POA: Diagnosis not present

## 2019-09-19 DIAGNOSIS — H04123 Dry eye syndrome of bilateral lacrimal glands: Secondary | ICD-10-CM | POA: Diagnosis not present

## 2019-09-19 DIAGNOSIS — D3132 Benign neoplasm of left choroid: Secondary | ICD-10-CM | POA: Diagnosis not present

## 2019-09-19 DIAGNOSIS — Z961 Presence of intraocular lens: Secondary | ICD-10-CM | POA: Diagnosis not present

## 2019-09-23 ENCOUNTER — Telehealth: Payer: Self-pay | Admitting: Student

## 2019-09-23 NOTE — Telephone Encounter (Signed)
NIR.  Received fax prescription refill request from pharmacy for [facility]. Faxed filled prescription to [id number] at [place] 90 mg tablets, take one tablet by mouth twice daily, dispense 60 tablets with 3 refills.   Bea Graff Lawerence Dery, PA-C 09/23/2019, 2:58 PM

## 2019-10-18 DIAGNOSIS — Z794 Long term (current) use of insulin: Secondary | ICD-10-CM | POA: Diagnosis not present

## 2019-10-18 DIAGNOSIS — E1121 Type 2 diabetes mellitus with diabetic nephropathy: Secondary | ICD-10-CM | POA: Diagnosis not present

## 2019-11-18 DIAGNOSIS — Z794 Long term (current) use of insulin: Secondary | ICD-10-CM | POA: Diagnosis not present

## 2019-11-18 DIAGNOSIS — E1165 Type 2 diabetes mellitus with hyperglycemia: Secondary | ICD-10-CM | POA: Diagnosis not present

## 2019-11-18 DIAGNOSIS — E041 Nontoxic single thyroid nodule: Secondary | ICD-10-CM | POA: Diagnosis not present

## 2019-11-20 ENCOUNTER — Encounter: Payer: Self-pay | Admitting: Cardiovascular Disease

## 2019-11-20 ENCOUNTER — Other Ambulatory Visit: Payer: Self-pay

## 2019-11-20 ENCOUNTER — Ambulatory Visit: Payer: Medicare Other | Admitting: Cardiovascular Disease

## 2019-11-20 VITALS — BP 130/75 | HR 75 | Ht 66.0 in | Wt 133.0 lb

## 2019-11-20 DIAGNOSIS — Z952 Presence of prosthetic heart valve: Secondary | ICD-10-CM

## 2019-11-20 DIAGNOSIS — I10 Essential (primary) hypertension: Secondary | ICD-10-CM

## 2019-11-20 DIAGNOSIS — I480 Paroxysmal atrial fibrillation: Secondary | ICD-10-CM | POA: Diagnosis not present

## 2019-11-20 DIAGNOSIS — Z9889 Other specified postprocedural states: Secondary | ICD-10-CM

## 2019-11-20 DIAGNOSIS — I2510 Atherosclerotic heart disease of native coronary artery without angina pectoris: Secondary | ICD-10-CM | POA: Diagnosis not present

## 2019-11-20 NOTE — Patient Instructions (Addendum)
Medication Instructions:  No changes *If you need a refill on your cardiac medications before your next appointment, please call your pharmacy*   Lab Work: none If you have labs (blood work) drawn today and your tests are completely normal, you will receive your results only by: Marland Kitchen MyChart Message (if you have MyChart) OR . A paper copy in the mail If you have any lab test that is abnormal or we need to change your treatment, we will call you to review the results.   Testing/Procedures: Your physician has requested that you have an echocardiogram. Echocardiography is a painless test that uses sound waves to create images of your heart. It provides your doctor with information about the size and shape of your heart and how well your heart's chambers and valves are working. This procedure takes approximately one hour. There are no restrictions for this procedure.   Follow-Up: At [facility], you and your health needs are our priority.  As part of our continuing mission to provide you with exceptional heart care, we have created designated Provider Care Teams.  These Care Teams include your primary Cardiologist (physician) and Advanced Practice Providers (APPs -  Physician Assistants and Nurse Practitioners) who all work together to provide you with the care you need, when you need it.  Your next appointment:   6  month(s)  The format for your next appointment:   In Person  Provider:   You may see Lauree Chandler, MD or one of the following Advanced Practice Providers on your designated Care Team:    Melina Copa, PA-C  Ermalinda Barrios, PA-C    Other Instructions

## 2019-11-20 NOTE — Progress Notes (Signed)
Chief Complaint  Patient presents with  . 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 I saw her as a new patient for evaluation of atrial fibrillation and severe MR on 07/03/12. She was diagnosed with atrial fibrillation April of 2014 as well as severe MR and TR. LV function has always been normal. She had a TEE guided cardioversion in May 2016 with restoration of sinus rhythm. Her MR was noted to be central and severe. The LV cavity was not dilated. LV systolic function was normal. There was moderate to severe TR as well. She was felt to be symptomatic from her mitral valve disease. Cardiac cath on 09/05/12 with mild CAD. She underwent placement of mitral valve ring and tricuspid valve ring as well as MAZE procedure per Dr. Roxy Manns on 10/11/12. Post-operative course complicated by recurrence of atrial fibrillation with bradycardia. She was seen by Dr. Caryl Comes as an EP consult and pacemaker not recommended. She was seen in the ED at [facility] 11/07/18 after a syncopal episode. Echo 11/23/18 with LVEF over 65%. Moderate LVH. Stable mitral and tricuspid repair.Severe aortic stenosis noted. Cardiac cath September 2020 with mild to moderate non-obstructive CAD. She underwent successful TAVR with a 23 mm Edwards Sapien 3 THV via the TF approach on 01/22/19. Post operative echo showed EF 65-70%, normally functioning TAVR with mean gradient of 13 mm Hg and mild PVL. Unfortunately, her TAVR was complicated by an extensive right groin hematoma requiring surgical repair and blood transfusions as well as cardioembolic CVA. MRA showed multifocal acute ischemia scattered within both hemispheres, left-greater-than-right, and in the left cerebellum most  consistent with central embolic source. She was restarted on Xarelto. She was readmitted to [facility] from 11/9-11/12/20 with groin pain and recurrent aphagia. Her Hg was noted to be down to 7.4 and she was transfused. CT abdomen pelvis showed a right inguinal hematoma and small right pelvic hematoma. CT head showed no acute abnormality but MRI brain showed several small acute cortical-based infarcts in the left frontal, parietal and occipital lobes which may represent an embolic source.  CT angio of the head and neck showed severe narrowing of the left middle cerebral artery and moderate to severe narrowing of the left posterior cerebral artery.  She was evaluated by neurology and underwent full stroke work-up. Her stroke was felt to be most likely related to her intracranial atherosclerotic disease and a baby aspirin was added to her Xarelto. Cardiac monitor March 2021 with sinus with PACs. She has since undergone treatment of a left MCA stenosis and has been on Brilinta. ASA was stopped.   She is here today for follow up. The patient denies any chest pain, dyspnea, palpitations, lower extremity edema, orthopnea, PND, near syncope or syncope. She has dizziness with standing. She has trouble sleeping.   Primary Care Physician: Lajean Manes, MD  Past Medical History:  Diagnosis Date  . Acute blood loss anemia, transfused 1 unit PRBC 01/26/19 01/27/2019  . Anxiety   . Arthritis    knees  . Atrial fibrillation ([place])   . Cancer ([place])    left breast  . Cataracts, bilateral   . Depression   . Diabetes mellitus   . Dyspnea  with exertion  . Full dentures   . GERD (gastroesophageal reflux disease)   . Hearing aid worn    B/L  . History of TIA (transient ischemic attack)    mini stroke with vision problems  . Hypertension   . Middle cerebral artery stenosis, left   . Mitral regurgitation   . Pneumonia   . PONV (postoperative nausea and vomiting)   . S/P Maze operation for atrial fibrillation  10/11/2012   Complete bilateral atrial lesion set using bipolar radiofrequency and cryothermy ablation with clipping of LA appendage  . 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   23mm Sorin Memo 3D ring annuloplasty with 26 mm Edwards mc3 tricuspid ring annuloplasty   . 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 Thyroid nodule   . Tricuspid regurgitation   . Wears glasses     Past Surgical History:  Procedure Laterality Date  . ARTERY REPAIR Right 01/22/2019   Procedure: Right Common Femoral Artery Repair;  Surgeon: Angelia Mould, MD;  Location: [place];  Service: Vascular;  Laterality: Right;  . CARDIAC CATHETERIZATION  09-05-12  . CARDIOVERSION N/A 07/31/2012   Procedure: CARDIOVERSION;  Surgeon: Josue Hector, MD;  Location: [place];  Service: Cardiovascular;  Laterality: N/A;  . CATARACT EXTRACTION W/ INTRAOCULAR LENS  IMPLANT, BILATERAL    . CESAREAN SECTION     x 2  . COLONOSCOPY WITH PROPOFOL N/A 11/28/2014   Procedure: COLONOSCOPY WITH PROPOFOL;  Surgeon: Carol Ada, MD;  Location: [facility];  Service: Endoscopy;  Laterality: N/A;  . DILATION AND CURETTAGE OF UTERUS    . FEMORAL ARTERY EXPLORATION Right 01/22/2019   Procedure: RIGHT GROIN EXPLORATION with evacuation of hematoma;  Surgeon: Angelia Mould, MD;  Location: [facility];  Service: Vascular;  Laterality: Right;  . FLEXIBLE SIGMOIDOSCOPY N/A 12/18/2015   Procedure: FLEXIBLE SIGMOIDOSCOPY;  Surgeon: Carol Ada, MD;  Location: [facility];  Service: Endoscopy;  Laterality: N/A;  . INTRAOPERATIVE TRANSESOPHAGEAL ECHOCARDIOGRAM N/A 10/11/2012   Procedure: INTRAOPERATIVE TRANSESOPHAGEAL ECHOCARDIOGRAM;  Surgeon: Rexene Alberts, MD;  Location: [place];  Service: Open Heart Surgery;  Laterality: N/A;  . IR ANGIO INTRA EXTRACRAN SEL COM CAROTID INNOMINATE BILAT MOD SED  05/21/2019  . IR ANGIO VERTEBRAL SEL VERTEBRAL BILAT MOD SED   05/21/2019  . MASTECTOMY Left 1982  . MAZE N/A 10/11/2012   Procedure: MAZE;  Surgeon: Rexene Alberts, MD;  Location: [place];  Service: Open Heart Surgery;  Laterality: N/A;  . MITRAL VALVE REPAIR N/A 10/11/2012   Procedure: MITRAL VALVE REPAIR (MVR);  Surgeon: Rexene Alberts, MD;  Location: [place];  Service: Open Heart Surgery;  Laterality: N/A;  . MULTIPLE TOOTH EXTRACTIONS    . RADIOLOGY WITH ANESTHESIA N/A 06/19/2019   Procedure: STENTING;  Surgeon: Luanne Bras, MD;  Location: [place];  Service: Radiology;  Laterality: N/A;  . RIGHT HEART CATH AND CORONARY ANGIOGRAPHY N/A 12/24/2018   Procedure: RIGHT HEART CATH AND CORONARY ANGIOGRAPHY;  Surgeon: [name], MD;  Location: [place] [facility];  Service: Cardiovascular;  Laterality: N/A;  . TEE WITHOUT CARDIOVERSION N/A 07/31/2012   Procedure: TRANSESOPHAGEAL ECHOCARDIOGRAM (TEE);  Surgeon: Josue Hector, MD;  Location: [place];  Service: Cardiovascular;  Laterality: N/A;  . TEE WITHOUT CARDIOVERSION N/A 01/22/2019   Procedure: TRANSESOPHAGEAL ECHOCARDIOGRAM (TEE);  Surgeon: [name], MD;  Location: [place] [facility];  Service: Open Heart  Surgery;  Laterality: N/A;  . TRANSCATHETER AORTIC VALVE REPLACEMENT, TRANSFEMORAL N/A 01/22/2019   Procedure: TRANSCATHETER AORTIC VALVE REPLACEMENT, TRANSFEMORAL;  Surgeon: [name], MD;  Location: [place] [facility];  Service: Open Heart Surgery;  Laterality: N/A;  . TRICUSPID VALVE REPLACEMENT N/A 10/11/2012   Procedure: TRICUSPID VALVE REPAIR;  Surgeon: Rexene Alberts, MD;  Location: [place];  Service: Open Heart Surgery;  Laterality: N/A;  . TUBAL LIGATION      Current Outpatient Medications  Medication Sig Dispense Refill  . acetaminophen (TYLENOL) 500 MG tablet Take 1,000 mg by mouth every 6 (six) hours as needed for moderate pain or headache.    Marland Kitchen atorvastatin (LIPITOR) 40 MG tablet Take 40 mg by mouth at bedtime.     . busPIRone (BUSPAR) 10 MG  tablet Take 5 mg by mouth 2 (two) times daily.     . Calcium Carb-Cholecalciferol (CALCIUM PLUS VITAMIN D3) 600-500 MG-UNIT CAPS Take 1 tablet by mouth 2 (two) times daily.    . Cyanocobalamin (VITAMIN B-12 PO) Take 1 tablet by mouth daily.    . Insulin Detemir (LEVEMIR FLEXPEN) 100 UNIT/ML Pen Inject 33 Units into the skin daily.     . Multiple Vitamins-Minerals (MULTIVITAMIN WITH MINERALS) tablet Take 1 tablet by mouth daily.    Marland Kitchen omeprazole (PRILOSEC) 40 MG capsule Take 40 mg by mouth daily before breakfast.     . Rivaroxaban (XARELTO) 15 MG TABS tablet Take 15 mg by mouth daily with supper.    . ticagrelor (BRILINTA) 90 MG TABS tablet Take by mouth 2 (two) times daily.    Marland Kitchen venlafaxine XR (EFFEXOR-XR) 150 MG 24 hr capsule Take 150 mg by mouth daily with breakfast. Take with 75 mg capsule to equal 225 mg daily    . venlafaxine XR (EFFEXOR-XR) 75 MG 24 hr capsule Take 75 mg by mouth daily with breakfast. Take with 150 mg capsule to equal 225 mg daily    . magnesium hydroxide (MILK OF MAGNESIA) 400 MG/5ML suspension Take 30 mLs by mouth daily as needed for indigestion.  (Patient not taking: Reported on 11/20/2019)    . Semaglutide,0.25 or 0.5MG /DOS, (OZEMPIC, 0.25 OR 0.5 MG/DOSE,) 2 MG/1.5ML SOPN Inject 0.5 mg into the skin every Monday.  (Patient not taking: Reported on 11/20/2019)     No current facility-administered medications for this visit.    Allergies  Allergen Reactions  . Metformin And Related Diarrhea  . Ultram [Tramadol] Nausea And Vomiting  . Nickel Rash    Pt unable to wear jewelry made of nickel.  . Pneumococcal Vaccines Swelling and Rash    At injection set    Social History   Socioeconomic History  . Marital status: Widowed    Spouse name: Not on file  . Number of children: 3  . Years of education: Not on file  . Highest education level: Not on file  Occupational History  . Occupation: Air cabin crew  Tobacco Use  . Smoking status: Never  Smoker  . Smokeless tobacco: Never Used  Vaping Use  . Vaping Use: Never used  Substance and Sexual Activity  . Alcohol use: No  . Drug use: No  . Sexual activity: Never  Other Topics Concern  . Not on file  Social History Narrative  . Not on file   Social Determinants of Health   Financial Resource Strain:   . Difficulty of Paying Living Expenses: Not on file  Food Insecurity:   . Worried About [employer] of  Food in the Last Year: Not on file  . Ran Out of Food in the Last Year: Not on file  Transportation Needs:   . Lack of Transportation (Medical): Not on file  . Lack of Transportation (Non-Medical): Not on file  Physical Activity:   . Days of Exercise per Week: Not on file  . Minutes of Exercise per Session: Not on file  Stress:   . Feeling of Stress : Not on file  Social Connections:   . Frequency of Communication with Friends and Family: Not on file  . Frequency of Social Gatherings with Friends and Family: Not on file  . Attends Religious Services: Not on file  . Active Member of Clubs or Organizations: Not on file  . Attends Archivist Meetings: Not on file  . Marital Status: Not on file  Intimate Partner Violence:   . Fear of Current or Ex-Partner: Not on file  . Emotionally Abused: Not on file  . Physically Abused: Not on file  . Sexually Abused: Not on file    Family History  Problem Relation Age of Onset  . Alcohol abuse Mother   . Cancer Father        prostate  . Hypertension Father   . Heart attack Paternal Grandfather   . Stroke Neg Hx     Review of Systems:  As stated in the HPI and otherwise negative.   BP 130/75   Pulse 75   Ht 5\' 6"  (1.676 m)   Wt 133 lb (60.3 kg)   BMI 21.47 kg/m   Physical Examination:  General: Well developed, well nourished, NAD  HEENT: OP clear, mucus membranes moist  SKIN: warm, dry. No rashes. Neuro: No focal deficits  Musculoskeletal: Muscle strength 5/5 all ext  Psychiatric: Mood and affect  normal  Neck: No JVD, no carotid bruits, no thyromegaly, no lymphadenopathy.  Lungs:Clear bilaterally, no wheezes, rhonci, crackles Cardiovascular: Regular rate and rhythm. No murmurs, gallops or rubs. Abdomen:Soft. Bowel sounds present. Non-tender.  Extremities: No lower extremity edema. Pulses are 2 + in the bilateral DP/PT.  Echo December 2020:  1. Left ventricular ejection fraction, by visual estimation, is 65 to  70%. The left ventricle has hyperdynamic function. There is mildly  increased left ventricular hypertrophy.  2. Elevated left ventricular end-diastolic pressure.  3. Left ventricular diastolic parameters are consistent with Grade II  diastolic dysfunction (pseudonormalization).  4. Global right ventricle has normal systolic function.The right  ventricular size is normal. No increase in right ventricular wall  thickness.  5. Left atrial size was mildly dilated.  6. Right atrial size was normal.  7. Moderate calcification of the mitral valve leaflet(s).  8. Moderate thickening of the mitral valve leaflet(s).  9. Severe mitral annular calcification.  10. The mitral valve is normal in structure. No evidence of mitral valve  regurgitation. Mild mitral stenosis.  11. The tricuspid valve is normal in structure. Tricuspid valve  regurgitation is mild.  12. Aortic valve regurgitation is not visualized. No evidence of aortic  valve sclerosis or stenosis.  13. The pulmonic valve was normal in structure. Pulmonic valve  regurgitation is trivial.  14. The inferior vena cava is normal in size with greater than 50%  respiratory variability, suggesting right atrial pressure of 3 mmHg.  15. 23 mm Edwards SAPIEN 3 Ultra valve has peak/mean gradients 21/13 mmHg,  stable mild paravalvular leak. AVA 1.4 cm2.   EKG:  EKG is  ordered today. The ekg ordered today demonstrates  Sinus, rate 75 bpm.   Recent Labs: 01/18/2019: ALT 21; B Natriuretic Peptide 67.9 01/23/2019:  Magnesium 1.7 05/27/2019: TSH 4.380 06/27/2019: BUN 28; Creatinine, Ser 1.10; Hemoglobin 13.4; Platelets 204; Potassium 3.7; Sodium 137   Lipid Panel    Component Value Date/Time   CHOL 112 01/23/2019 1530   TRIG 149 01/23/2019 1530   HDL 38 (L) 01/23/2019 1530   CHOLHDL 2.9 01/23/2019 1530   VLDL 30 01/23/2019 1530   LDLCALC 44 01/23/2019 1530     Wt Readings from Last 3 Encounters:  11/20/19 133 lb (60.3 kg)  07/16/19 137 lb 6.4 oz (62.3 kg)  06/27/19 145 lb (65.8 kg)     Other studies Reviewed: Additional studies/ records that were reviewed today include: . Review of the above records demonstrates:    Assessment and Plan:   1. Severe Aortic stenosiss/p TAVR: She is doing well almost one year out from her TAVR. Mild PVL by echo December 2020 with mildly elevated gradients. Will continue Xarelto. Repeat echo in October 2021.   2. Mitral regurgitation: She is s/p MV ring in 2014, stable by echo 2020  3. Tricuspid regurgitation: She is s/p TV ring in 2014. Stable by echo in 2020  4. CAD without angina: Mild CAD by cath in 2020. She has no chest pain. Continue statin    5. HTN: BP is controlled. No changes  6. Atrial fibrillation, paroxysmal: She is in sinus rhythm today. Will continue Xarelto.     7. Right groin hematoma: Well healed.   8. Thyroid nodule: She is being followed by Dr. Denton Lank at [facility].   9. Palpitations: Cardiac monitor March 2021 with sinus with PACs.   10. History of stroke: She is being followed by IR for left MCA stenosis and has been started on Brliinta. Delay in stenting due to pt being asymptomatic.    Current medicines are reviewed at length with the patient today.  The patient does not have concerns regarding medicines.  The following changes have been made:  no change  Labs/ tests ordered today include:   Orders Placed This Encounter  Procedures  . EKG 12-Lead  . ECHOCARDIOGRAM COMPLETE    Disposition:   FU with me after  her TAVR.   Signed, Lauree Chandler, MD 11/20/2019 4:37 PM    [address], [place], [place]  [address] Phone: [phone]; Fax: [phone]

## 2019-12-03 NOTE — Addendum Note (Signed)
Addended by: Rodman Key on: 12/03/2019 01:57 PM   Modules accepted: Orders

## 2019-12-04 DIAGNOSIS — C50912 Malignant neoplasm of unspecified site of left female breast: Secondary | ICD-10-CM | POA: Diagnosis not present

## 2019-12-04 DIAGNOSIS — E7800 Pure hypercholesterolemia, unspecified: Secondary | ICD-10-CM | POA: Diagnosis not present

## 2019-12-04 DIAGNOSIS — D6869 Other thrombophilia: Secondary | ICD-10-CM | POA: Diagnosis not present

## 2019-12-04 DIAGNOSIS — E1121 Type 2 diabetes mellitus with diabetic nephropathy: Secondary | ICD-10-CM | POA: Diagnosis not present

## 2019-12-04 DIAGNOSIS — I129 Hypertensive chronic kidney disease with stage 1 through stage 4 chronic kidney disease, or unspecified chronic kidney disease: Secondary | ICD-10-CM | POA: Diagnosis not present

## 2019-12-04 DIAGNOSIS — I480 Paroxysmal atrial fibrillation: Secondary | ICD-10-CM | POA: Diagnosis not present

## 2019-12-04 DIAGNOSIS — Z23 Encounter for immunization: Secondary | ICD-10-CM | POA: Diagnosis not present

## 2019-12-04 DIAGNOSIS — Z79899 Other long term (current) drug therapy: Secondary | ICD-10-CM | POA: Diagnosis not present

## 2019-12-26 ENCOUNTER — Encounter: Payer: Self-pay | Admitting: Sports Medicine

## 2019-12-26 ENCOUNTER — Other Ambulatory Visit: Payer: Self-pay

## 2019-12-26 ENCOUNTER — Ambulatory Visit (INDEPENDENT_AMBULATORY_CARE_PROVIDER_SITE_OTHER): Payer: Medicare Other | Admitting: Sports Medicine

## 2019-12-26 DIAGNOSIS — I739 Peripheral vascular disease, unspecified: Secondary | ICD-10-CM

## 2019-12-26 DIAGNOSIS — E118 Type 2 diabetes mellitus with unspecified complications: Secondary | ICD-10-CM | POA: Diagnosis not present

## 2019-12-26 DIAGNOSIS — Z794 Long term (current) use of insulin: Secondary | ICD-10-CM | POA: Diagnosis not present

## 2019-12-26 DIAGNOSIS — M79676 Pain in unspecified toe(s): Secondary | ICD-10-CM | POA: Diagnosis not present

## 2019-12-26 DIAGNOSIS — B351 Tinea unguium: Secondary | ICD-10-CM | POA: Diagnosis not present

## 2019-12-26 DIAGNOSIS — M79609 Pain in unspecified limb: Secondary | ICD-10-CM

## 2019-12-26 NOTE — Progress Notes (Signed)
Subjective: Rachel Santos is a 79 y.o. female patient with history of diabetes who presents to office today complaining of long,mildly painful nails while ambulating in shoes; unable to trim.  Patient is assisted by daughter who helps with history.   Fasting blood sugar not recorded  Review of systems noncontributory  Patient Active Problem List   Diagnosis Date Noted   Cerebral artery occlusion 06/27/2019   Acute blood loss anemia, transfused 2 units PRBC total this admit 01/27/2019   Acute CVA (cerebrovascular accident) ([place]) 01/24/2019   Groin hematoma with urgent evacuation 01/23/19 01/23/2019   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   Type 2 diabetes mellitus with complication, with long-term current use of insulin ([facility])    Severe aortic stenosis    HTN (hypertension) 08/13/2013   Insomnia 08/13/2013   S/P TVR (tricuspid valve repair) 05/27/2013   Long term (current) use of anticoagulants 10/25/2012   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   S/P Maze operation for atrial fibrillation 10/11/2012   PAF (paroxysmal atrial fibrillation) ([place]) 07/03/2012   Current Outpatient Medications on File Prior to Visit  Medication Sig Dispense Refill   acetaminophen (TYLENOL) 500 MG tablet Take 1,000 mg by mouth every 6 (six) hours as needed for moderate pain or headache.     atorvastatin (LIPITOR) 40 MG tablet Take 40 mg by mouth at bedtime.      busPIRone (BUSPAR) 10 MG tablet Take 5 mg by mouth 2 (two) times daily.      Calcium Carb-Cholecalciferol (CALCIUM PLUS VITAMIN D3) 600-500 MG-UNIT CAPS Take 1 tablet by mouth 2 (two) times daily.     Cyanocobalamin (VITAMIN B-12 PO) Take 1 tablet by mouth daily.     Insulin Detemir (LEVEMIR FLEXPEN) 100 UNIT/ML Pen Inject 33 Units into the skin daily.      magnesium hydroxide (MILK OF MAGNESIA) 400 MG/5ML suspension Take 30 mLs by mouth daily as needed for indigestion.      Multiple Vitamins-Minerals  (MULTIVITAMIN WITH MINERALS) tablet Take 1 tablet by mouth daily.     omeprazole (PRILOSEC) 40 MG capsule Take 40 mg by mouth daily before breakfast.      Rivaroxaban (XARELTO) 15 MG TABS tablet Take 15 mg by mouth daily with supper.     Semaglutide,0.25 or 0.5MG /DOS, (OZEMPIC, 0.25 OR 0.5 MG/DOSE,) 2 MG/1.5ML SOPN Inject 0.5 mg into the skin every Monday.      ticagrelor (BRILINTA) 90 MG TABS tablet Take by mouth 2 (two) times daily.     venlafaxine XR (EFFEXOR-XR) 150 MG 24 hr capsule Take 150 mg by mouth daily with breakfast. Take with 75 mg capsule to equal 225 mg daily     venlafaxine XR (EFFEXOR-XR) 75 MG 24 hr capsule Take 75 mg by mouth daily with breakfast. Take with 150 mg capsule to equal 225 mg daily     No current facility-administered medications on file prior to visit.   Allergies  Allergen Reactions   Metformin And Related Diarrhea   Ultram [Tramadol] Nausea And Vomiting   Nickel Rash    Pt unable to wear jewelry made of nickel.   Pneumococcal Vaccines Swelling and Rash    At injection set    No results found for this or any previous visit (from the past 2160 hour(s)).  Objective: General: Patient is awake, alert, and oriented x 3 and in no acute distress.  Integument: Skin is warm, dry and supple bilateral. Nails are tender, long, thickened and  dystrophic with subungual debris,  consistent with onychomycosis, 1-5 bilateral worse at bilateral hallux. No signs of infection. No open lesions or preulcerative lesions present bilateral. Remaining integument unremarkable.  Vasculature:  Dorsalis Pedis pulse 1/4 bilateral. Posterior Tibial pulse 1/4 bilateral.  Capillary fill time <5 sec 1-5 bilateral.  Diminished hair growth to the level of the digits. Temperature gradient within normal limits.  Moderate varicosities present bilateral. No edema present bilateral.   Neurology: The patient has intact sensation measured with a 5.07/10g Semmes Weinstein Monofilament at  all pedal sites bilateral . Vibratory sensation diminished bilateral with tuning fork. No Babinski sign present bilateral.   Musculoskeletal:  Asymptomatic hammertoe pedal deformities noted bilateral. Muscular strength 5/5 in all lower extremity muscular groups bilateral without pain on range of motion . No tenderness with calf compression bilateral.  Assessment and Plan: Problem List Items Addressed This Visit      Endocrine   Type 2 diabetes mellitus with complication, with long-term current use of insulin ([place])    Other Visit Diagnoses    Pain due to onychomycosis of nail    -  Primary   PVD (peripheral vascular disease) ([place])          -Examined patient. -Discussed and educated patient on diabetic foot care, especially with regards to the vascular, neurological and musculoskeletal systems.  -Stressed the importance of good glycemic control and the detriment of not  controlling glucose levels in relation to the foot. -Mechanically debrided all nails 1-5 bilateral using sterile nail nipper and filed with dremel without incident  -Advised Vicks VapoRub to use to toenails as needed -Answered all patient questions -Patient to return  in 3 months for at risk foot care -Patient advised to call the office if any problems or questions arise in the meantime.  Landis Martins, DPM

## 2020-01-14 ENCOUNTER — Telehealth: Payer: Self-pay | Admitting: Physician Assistant

## 2020-01-14 NOTE — Telephone Encounter (Signed)
[name], Daughter of the patient was returning [employer] about scheduling an appointment. Please call back.

## 2020-01-14 NOTE — Telephone Encounter (Signed)
Spoke with [name], patient's daughter and transportation. Scheduled the patient for 1 year TAVRecho and office visit with Nell Range 01/30/2020. She was grateful for assistance.

## 2020-01-15 DIAGNOSIS — E1121 Type 2 diabetes mellitus with diabetic nephropathy: Secondary | ICD-10-CM | POA: Diagnosis not present

## 2020-01-15 DIAGNOSIS — Z794 Long term (current) use of insulin: Secondary | ICD-10-CM | POA: Diagnosis not present

## 2020-01-21 ENCOUNTER — Other Ambulatory Visit (HOSPITAL_COMMUNITY): Payer: Medicare Other

## 2020-01-22 ENCOUNTER — Other Ambulatory Visit (HOSPITAL_COMMUNITY): Payer: Medicare Other

## 2020-01-23 ENCOUNTER — Other Ambulatory Visit (HOSPITAL_COMMUNITY): Payer: Medicare Other

## 2020-01-23 ENCOUNTER — Ambulatory Visit: Payer: Medicare Other | Admitting: Physician Assistant

## 2020-01-27 NOTE — Telephone Encounter (Signed)
I spoke with Dr. Angelena Form.  The Brilinta 90 mg twice daily was prescribed by Dr. Estanislado Pandy for left MCA stenosis.   Dr. Angelena Form is willing to be the prescriber going forward.  Per patient's daughter [name], the plan was to keep her mom on this instead of doing the stenting procedure.  Daughter will pick up samples of Brilinta today so that patient does not miss any doses.  Since she is in the donut hole, she will print paperwork from [employer] and complete patient's portion and bring the paperwork back.  She has a TAVR follow up with K. Grandville Silos later this week.

## 2020-01-30 ENCOUNTER — Ambulatory Visit (INDEPENDENT_AMBULATORY_CARE_PROVIDER_SITE_OTHER): Payer: Medicare Other | Admitting: Physician Assistant

## 2020-01-30 ENCOUNTER — Other Ambulatory Visit: Payer: Self-pay

## 2020-01-30 ENCOUNTER — Ambulatory Visit (HOSPITAL_COMMUNITY): Payer: Medicare Other | Attending: Cardiology

## 2020-01-30 ENCOUNTER — Encounter: Payer: Self-pay | Admitting: Physician Assistant

## 2020-01-30 VITALS — BP 136/72 | HR 68 | Ht 66.0 in | Wt 136.0 lb

## 2020-01-30 DIAGNOSIS — Z952 Presence of prosthetic heart valve: Secondary | ICD-10-CM

## 2020-01-30 DIAGNOSIS — I639 Cerebral infarction, unspecified: Secondary | ICD-10-CM | POA: Diagnosis not present

## 2020-01-30 DIAGNOSIS — E1121 Type 2 diabetes mellitus with diabetic nephropathy: Secondary | ICD-10-CM | POA: Diagnosis not present

## 2020-01-30 LAB — ECHOCARDIOGRAM COMPLETE
AR max vel: 1.78 cm2
AV Area VTI: 1.74 cm2
AV Area mean vel: 1.73 cm2
AV Mean grad: 12.5 mmHg
AV Peak grad: 18.4 mmHg
Ao pk vel: 2.15 m/s
Area-P 1/2: 1.78 cm2
Height: 66 in
S' Lateral: 2 cm

## 2020-01-30 NOTE — Patient Instructions (Signed)
Medication Instructions:  Your provider recommends that you continue on your current medications as directed. Please refer to the Current Medication list given to you today.   *If you need a refill on your cardiac medications before your next appointment, please call your pharmacy*   Follow-Up: At [facility], you and your health needs are our priority.  As part of our continuing mission to provide you with exceptional heart care, we have created designated Provider Care Teams.  These Care Teams include your primary Cardiologist (physician) and Advanced Practice Providers (APPs -  Physician Assistants and Nurse Practitioners) who all work together to provide you with the care you need, when you need it. Your next appointment:   6 month(s) The format for your next appointment:   In Person Provider:   You may see Lauree Chandler, MD or one of the following Advanced Practice Providers on your designated Care Team:    Melina Copa, PA-C  Ermalinda Barrios, PA-C

## 2020-01-30 NOTE — Progress Notes (Addendum)
[facility] [place]                                       Cardiology Office Note    Date:  01/31/2020   [name], DOB Mar 06, 1941, MRN [id number]  PCP:  Lajean Manes, MD  Cardiologist:  Dr. Angelena Form   CC: 1 year s/p TAVR  History of Present Illness:  Rachel Santos is a 79 y.o. 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   She underwent successful TAVR with a 23 mm Edwards Sapien 3 THV via the TF approach on 01/22/19. Post operative echo showed EF 65-70%, normally functioning TAVR with mean gradient of 13 mm Hg and mild PVL. Unfortunately,her TAVR was complicated by anextensive right groin hematomarequiring surgical repair and blood transfusions as well as cardioembolic CVA.MRA showed multifocal acute ischemia scattered within both hemispheres, left-greater-than-right, and in the left cerebellummost consistent with central embolic source. She was restarted on Xarelto. She was readmitted to [facility] from 11/9-11/12/20 with groin pain and recurrent aphagia. Her Hg was noted to be down to 7.4 and she was transfused. CT abdomen pelvis showed a right inguinal hematoma and small right pelvic hematoma. CT head showed no acuteabnormality but MRI brain showed several small acute cortical-based infarcts in the left frontal, parietal and occipital lobes which may represent an embolic source. CT angio of the head and neck showed severe narrowing of the left middle cerebral artery and moderate to severe narrowing of the left posterior cerebral artery. She was evaluated by neurology and underwent full stroke work-up. Her stroke was felt to be most likely relatedtoher intracranial  atherosclerotic disease and a baby aspirin was added to her Xarelto. Cardiac monitor March 2021 with sinus with PACs. She has since undergone attempted treatment of a left MCA stenosis (per patient- no intervention was completed) and was started on Brilinta. ASA was stopped.    Today she presents to clinic for follow-up. Here with daughter. Doing well. Wants a refill on Brilinta. Since being on this she has had less slurred speecha and dizziness episodes. No CP. She does have chronic SOB. No LE edema, orthopnea or PND. No dizziness or syncope. No blood in stool or urine. No palpitations.     Past Medical History:  Diagnosis Date  . Acute blood loss anemia, transfused 1 unit PRBC 01/26/19 01/27/2019  . Anxiety   . Arthritis    knees  . Atrial fibrillation ([place])   . Cancer ([place])    left breast  . Cataracts, bilateral   . Depression   . Diabetes mellitus   . Dyspnea    with exertion  . Full dentures   . GERD (gastroesophageal reflux disease)   . Hearing aid worn    B/L  . History of TIA (transient ischemic attack)    mini stroke with vision problems  . Hypertension   . Middle cerebral artery stenosis, left   . Mitral regurgitation   . Pneumonia   . PONV (postoperative nausea and vomiting)   . S/P Maze operation for atrial fibrillation 10/11/2012   Complete bilateral atrial lesion set using  bipolar radiofrequency and cryothermy ablation with clipping of LA appendage  . 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   1mm Sorin Memo 3D ring annuloplasty with 26 mm Edwards mc3 tricuspid ring annuloplasty   . 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 Thyroid nodule   . Tricuspid regurgitation   . Wears glasses     Past Surgical History:  Procedure Laterality Date  . ARTERY REPAIR Right 01/22/2019   Procedure: Right Common Femoral Artery Repair;  Surgeon: Angelia Mould, MD;  Location: [facility];   Service: Vascular;  Laterality: Right;  . CARDIAC CATHETERIZATION  09-05-12  . CARDIOVERSION N/A 07/31/2012   Procedure: CARDIOVERSION;  Surgeon: Josue Hector, MD;  Location: [facility];  Service: Cardiovascular;  Laterality: N/A;  . CATARACT EXTRACTION W/ INTRAOCULAR LENS  IMPLANT, BILATERAL    . CESAREAN SECTION     x 2  . COLONOSCOPY WITH PROPOFOL N/A 11/28/2014   Procedure: COLONOSCOPY WITH PROPOFOL;  Surgeon: Carol Ada, MD;  [facility];  Service: Endoscopy;  Laterality: N/A;  . DILATION AND CURETTAGE OF UTERUS    . FEMORAL ARTERY EXPLORATION Right 01/22/2019   Procedure: RIGHT GROIN EXPLORATION with evacuation of hematoma;  Surgeon: Angelia Mould, MD;  Location: [facility];  Service: Vascular;  Laterality: Right;  . FLEXIBLE SIGMOIDOSCOPY N/A 12/18/2015   Procedure: FLEXIBLE SIGMOIDOSCOPY;  Surgeon: Carol Ada, MD;  [facility];  Service: Endoscopy;  Laterality: N/A;  . INTRAOPERATIVE TRANSESOPHAGEAL ECHOCARDIOGRAM N/A 10/11/2012   Procedure: INTRAOPERATIVE TRANSESOPHAGEAL ECHOCARDIOGRAM;  Surgeon: Rexene Alberts, MD;  Location: [facility];  Service: Open Heart Surgery;  Laterality: N/A;  . IR ANGIO INTRA EXTRACRAN SEL COM CAROTID INNOMINATE BILAT MOD SED  05/21/2019  . IR ANGIO VERTEBRAL SEL VERTEBRAL BILAT MOD SED  05/21/2019  . MASTECTOMY Left 1982  . MAZE N/A 10/11/2012   Procedure: MAZE;  Surgeon: Rexene Alberts, MD;  Location: [facility];  Service: Open Heart Surgery;  Laterality: N/A;  . MITRAL VALVE REPAIR N/A 10/11/2012   Procedure: MITRAL VALVE REPAIR (MVR);  Surgeon: Rexene Alberts, MD;  Location: [facility];  Service: Open Heart Surgery;  Laterality: N/A;  . MULTIPLE TOOTH EXTRACTIONS    . RADIOLOGY WITH ANESTHESIA N/A 06/19/2019   Procedure: STENTING;  Surgeon: Luanne Bras, MD;  Location: [facility];  Service: Radiology;  Laterality: N/A;  . RIGHT HEART CATH AND CORONARY ANGIOGRAPHY N/A 12/24/2018   Procedure: RIGHT HEART CATH AND CORONARY ANGIOGRAPHY;   Surgeon: [name], MD;  Location: [facility];  Service: Cardiovascular;  Laterality: N/A;  . TEE WITHOUT CARDIOVERSION N/A 07/31/2012   Procedure: TRANSESOPHAGEAL ECHOCARDIOGRAM (TEE);  Surgeon: Josue Hector, MD;  Location: [facility];  Service: Cardiovascular;  Laterality: N/A;  . TEE WITHOUT CARDIOVERSION N/A 01/22/2019   Procedure: TRANSESOPHAGEAL ECHOCARDIOGRAM (TEE);  Surgeon: [name], MD;  Location: [facility];  Service: Open Heart Surgery;  Laterality: N/A;  . TRANSCATHETER AORTIC VALVE REPLACEMENT, TRANSFEMORAL N/A 01/22/2019   Procedure: TRANSCATHETER AORTIC VALVE REPLACEMENT, TRANSFEMORAL;  Surgeon: [name], MD;  Location: [facility];  Service: Open Heart Surgery;  Laterality: N/A;  . TRICUSPID VALVE REPLACEMENT N/A 10/11/2012   Procedure: TRICUSPID VALVE REPAIR;  Surgeon: Rexene Alberts, MD;  Location: [facility];  Service: Open Heart Surgery;  Laterality: N/A;  . TUBAL LIGATION      Current Medications: Outpatient Medications  Prior to Visit  Medication Sig Dispense Refill  . acetaminophen (TYLENOL) 500 MG tablet Take 1,000 mg by mouth every 6 (six) hours as needed for moderate pain or headache.    Marland Kitchen atorvastatin (LIPITOR) 40 MG tablet Take 40 mg by mouth at bedtime.     . busPIRone (BUSPAR) 10 MG tablet Take 5 mg by mouth 2 (two) times daily.     . Calcium Carb-Cholecalciferol (CALCIUM PLUS VITAMIN D3) 600-500 MG-UNIT CAPS Take 1 tablet by mouth 2 (two) times daily.    . Cyanocobalamin (VITAMIN B-12 PO) Take 1 tablet by mouth daily.    . Insulin Detemir (LEVEMIR FLEXPEN) 100 UNIT/ML Pen Inject 33 Units into the skin daily.     . magnesium hydroxide (MILK OF MAGNESIA) 400 MG/5ML suspension Take 30 mLs by mouth daily as needed for indigestion.     . Multiple Vitamins-Minerals (MULTIVITAMIN WITH MINERALS) tablet Take 1 tablet by mouth daily.    Marland Kitchen omeprazole (PRILOSEC) 40 MG capsule Take 40 mg by mouth daily before  breakfast.     . Rivaroxaban (XARELTO) 15 MG TABS tablet Take 15 mg by mouth daily with supper.    . ticagrelor (BRILINTA) 90 MG TABS tablet Take by mouth 2 (two) times daily.    Marland Kitchen venlafaxine XR (EFFEXOR-XR) 150 MG 24 hr capsule Take 150 mg by mouth daily with breakfast. Take with 75 mg capsule to equal 225 mg daily    . venlafaxine XR (EFFEXOR-XR) 75 MG 24 hr capsule Take 75 mg by mouth daily with breakfast. Take with 150 mg capsule to equal 225 mg daily    . Semaglutide,0.25 or 0.5MG /DOS, (OZEMPIC, 0.25 OR 0.5 MG/DOSE,) 2 MG/1.5ML SOPN Inject 0.5 mg into the skin every Monday.  (Patient not taking: Reported on 01/30/2020)     No facility-administered medications prior to visit.     Allergies:   Metformin and related, Ultram [tramadol], Nickel, and Pneumococcal vaccines   Social History   Socioeconomic History  . Marital status: Widowed    Spouse name: Not on file  . Number of children: 3  . Years of education: Not on file  . Highest education level: Not on file  Occupational History  . Occupation: Air cabin crew  Tobacco Use  . Smoking status: Never Smoker  . Smokeless tobacco: Never Used  Vaping Use  . Vaping Use: Never used  Substance and Sexual Activity  . Alcohol use: No  . Drug use: No  . Sexual activity: Never  Other Topics Concern  . Not on file  Social History Narrative  . Not on file   Social Determinants of Health   Financial Resource Strain:   . Difficulty of Paying Living Expenses: Not on file  Food Insecurity:   . Worried About Charity fundraiser in the Last Year: Not on file  . Ran Out of Food in the Last Year: Not on file  Transportation Needs:   . Lack of Transportation (Medical): Not on file  . Lack of Transportation (Non-Medical): Not on file  Physical Activity:   . Days of Exercise per Week: Not on file  . Minutes of Exercise per Session: Not on file  Stress:   . Feeling of Stress : Not on file  Social Connections:   .  Frequency of Communication with Friends and Family: Not on file  . Frequency of Social Gatherings with Friends and Family: Not on file  . Attends Religious Services: Not on file  . Active Member of  Clubs or Organizations: Not on file  . Attends Archivist Meetings: Not on file  . Marital Status: Not on file     Family History:  The patient's family history includes Alcohol abuse in her mother; Cancer in her father; Heart attack in her paternal grandfather; Hypertension in her father.     ROS:   Please see the history of present illness.    ROS All other systems reviewed and are negative.   PHYSICAL EXAM:   VS:  BP 136/72   Pulse 68   Ht 5\' 6"  (1.676 m)   Wt 136 lb (61.7 kg)   SpO2 99%   BMI 21.95 kg/m    GEN: Well nourished, well developed, in no acute distress HEENT: normal Neck: no JVD or masses Cardiac: RRR; no murmurs, rubs, or gallops,no edema  Respiratory:  clear to auscultation bilaterally, normal work of breathing GI: soft, nontender, nondistended, + BS MS: no deformity or atrophy Skin: warm and dry, no rash.  Neuro:  Alert and Oriented x 3, Strength and sensation are intact Psych: euthymic mood, full affect   Wt Readings from Last 3 Encounters:  01/30/20 136 lb (61.7 kg)  11/20/19 133 lb (60.3 kg)  07/16/19 137 lb 6.4 oz (62.3 kg)      Studies/Labs Reviewed:   EKG:  EKG is NOT ordered today.    Recent Labs: 05/27/2019: TSH 4.380 06/27/2019: BUN 28; Creatinine, Ser 1.10; Hemoglobin 13.4; Platelets 204; Potassium 3.7; Sodium 137   Lipid Panel    Component Value Date/Time   CHOL 112 01/23/2019 1530   TRIG 149 01/23/2019 1530   HDL 38 (L) 01/23/2019 1530   CHOLHDL 2.9 01/23/2019 1530   VLDL 30 01/23/2019 1530   LDLCALC 44 01/23/2019 1530    Additional studies/ records that were reviewed today include:  TAVR OPERATIVE NOTE   Date of Procedure:01/22/2019  Preoperative Diagnosis:Severe Aortic Stenosis    Postoperative Diagnosis:Same   Procedure:   Transcatheter Aortic Valve Replacement - PercutaneousRightTransfemoral Approach Edwards Sapien 3 Ultra THV (size 52mm, model # 9750TFX, serial # [id number])  Co-Surgeons:Clarence H. Roxy Manns, MD and Lauree Chandler, MD  Anesthesiologist:Chris Ermalene Postin, MD  Echocardiographer:Mihai Croitoru, MD  Pre-operative Echo Findings: ? Severe aortic stenosis ? Normalleft ventricular systolic function  Post-operative Echo Findings: ? Mildparavalvular leak ? Normalleft ventricular systolic function   _________________   Echo 01/23/19: IMPRESSIONS 1. Left ventricular ejection fraction, by visual estimation, is 65 to 70%. The left ventricle has normal function. Normal left ventricular size. There is no left ventricular hypertrophy. 2. Left ventricular diastolic Doppler parameters are consistent with impaired relaxation pattern of LV diastolic filling. 3. Global right ventricle has normal systolic function.The right ventricular size is normal. No increase in right ventricular wall thickness. 4. Left atrial size was normal. 5. Right atrial size was normal. 6. Moderate mitral annular calcification. 7. The mitral valve is normal in structure. Mild mitral valve regurgitation. Mild mitral stenosis. 8. The tricuspid valve is normal in structure. Tricuspid valve regurgitation is trivial. 9. Aortic valve regurgitation is mild by color flow Doppler. Mild aortic valve stenosis. 10. Mild perivalvular regurgitation. 11. The pulmonic valve was normal in structure. Pulmonic valve regurgitation is not visualized by color flow Doppler. 12. Normal pulmonary artery systolic pressure. 13. The inferior vena cava is normal in size with greater than 50% respiratory variability, suggesting right atrial pressure of 3 mmHg.  _________________   01/24/19  MRI IMPRESSION: 1. Multifocal acute ischemia scattered within both hemispheres, left-greater-than-right, and in the  left cerebellum. The pattern is most consistent with a central embolic source, particularly in a patient with a history of atrial fibrillation. 2. No emergent large vessel occlusion or high-grade stenosis. 3. Mild intracranial atherosclerosis.  _________________  Echo 02/28/19 IMPRESSIONS  1. Left ventricular ejection fraction, by visual estimation, is 65 to 70%. The left ventricle has hyperdynamic function. There is mildly increased left ventricular hypertrophy.  2. Elevated left ventricular end-diastolic pressure.  3. Left ventricular diastolic parameters are consistent with Grade II diastolic dysfunction (pseudonormalization).  4. Global right ventricle has normal systolic function.The right ventricular size is normal. No increase in right ventricular wall thickness.  5. Left atrial size was mildly dilated.  6. Right atrial size was normal.  7. Moderate calcification of the mitral valve leaflet(s).  8. Moderate thickening of the mitral valve leaflet(s).  9. Severe mitral annular calcification. 10. The mitral valve is normal in structure. No evidence of mitral valve regurgitation. Mild mitral stenosis. 11. The tricuspid valve is normal in structure. Tricuspid valve regurgitation is mild. 12. Aortic valve regurgitation is not visualized. No evidence of aortic valve sclerosis or stenosis. 13. The pulmonic valve was normal in structure. Pulmonic valve regurgitation is trivial. 14. The inferior vena cava is normal in size with greater than 50% respiratory variability, suggesting right atrial pressure of 3 mmHg. 15. 23 mm Edwards SAPIEN 3 Ultra valve has peak/mean gradients 21/13 mmHg, stable mild paravalvular leak. AVA 1.4 cm2.  _________________  Echo 01/30/20 IMPRESSIONS    1. Left ventricular ejection fraction, by estimation, is 60 to 65%. The  left ventricle has  normal function. The left ventricle has no regional  wall motion abnormalities. There is mild left ventricular hypertrophy.  Left ventricular diastolic parameters  are consistent with Grade II diastolic dysfunction (pseudonormalization).  2. Right ventricular systolic function is normal. The right ventricular  size is normal. Tricuspid regurgitation signal is inadequate for assessing  PA pressure.  3. Left atrial size was moderately dilated.  4. Status post tricuspid valve repair. Trivial tricuspid regurgitation.  No significant stenosis with mean gradient 2 mmHg.  5. Status post mitral valve repair. Trivial mitral regurgitation. Mean  gradient 4 mmHg, mild mitral stenosis.  6. Bioprosthetic aortic valve s/p TAVR (23 mm Edwards Sapien). Trivial  peri-valvular leakage. Mean gradient 16 mmHg, mildly elevated.  7. The inferior vena cava is normal in size with greater than 50%  respiratory variability, suggesting right atrial pressure of 3 mmHg.   ASSESSMENT & PLAN:   Severe AS s/p TAVR: echo today shows EF 60%, normally functioning TAVR with mean gradient of 16 mm Hg and mild trivial PVL. She has NYHA class II symptoms, mostly of fatigue and dyspnea on exertion. SBE prophylaxis discussed; she has dentures and does not visit the dentist. She has been on Brilliant and Xarelto (for PAF).   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 She underwent attempted treatment of a left MCA stenosis by Dr. Estanislado Pandy (per patient- no intervention was completed) since she her symptoms had improved with Brillinta. She is asking for a refill on this from [place] because she does not follow long term with Dr. Estanislado Pandy. Will discuss with Dr. Angelena Form to see if he wants to refill this or leave to neuro as this seems like a strong medication to continue in combination with Xarelto.   _____________  ADDENDUM: Spoke with Dr. Angelena Form about Brilinta and Xarelto. He is okay with  continuing her on these medications as she has  done well from a neuro standpoint since being on both. The Brilinta has been very expensive for them and so plavix is another option. I have asked Dr. Milus Glazier nurse to reach out to the pt to let them to know we will fill her Brilinta or plavix, if they would like to switch.   Angelena Form PA-C  [facility]    Medication Adjustments/Labs and Tests Ordered: Current medicines are reviewed at length with the patient today.  Concerns regarding medicines are outlined above.  Medication changes, Labs and Tests ordered today are listed in the Patient Instructions below. Patient Instructions  Medication Instructions:  Your provider recommends that you continue on your current medications as directed. Please refer to the Current Medication list given to you today.   *If you need a refill on your cardiac medications before your next appointment, please call your pharmacy*   Follow-Up: At [facility], you and your health needs are our priority.  As part of our continuing mission to provide you with exceptional heart care, we have created designated Provider Care Teams.  These Care Teams include your primary Cardiologist (physician) and Advanced Practice Providers (APPs -  Physician Assistants and Nurse Practitioners) who all work together to provide you with the care you need, when you need it. Your next appointment:   6 month(s) The format for your next appointment:   In Person Provider:   You may see Lauree Chandler, MD or one of the following Advanced Practice Providers on your designated Care Team:    Melina Copa, PA-C  Ermalinda Barrios, PA-C      Signed, Angelena Form, PA-C  01/31/2020 5:14 PM    [facility] [address] [place], [place], [place]  [address] Phone: [phone]; Fax: [phone]

## 2020-02-03 NOTE — Telephone Encounter (Signed)
Per Message below from K. Grandville Silos, I left messages on both daughter's phones and then sent MyChart message to Ms. Muraski.   Eileen Stanford, PA-C  You; Via, Deliah Boston, LPN 2 hours ago (38:18 PM)   Hey guys, Dr. Angelena Form reviewed mediations and okay with her staying on Brilnta and Xarelto and refilling it. If it's too expensive for them, he is okay with swtiching her to plavix, if the pt and the daughters are okay with that. So I will leave it up to them. Michallene, can you reach out to them and see what they want to do? Thank you   Katie

## 2020-02-04 NOTE — Telephone Encounter (Signed)
Will route to KT and RPH since Brilinta was prescribed for left MCA stenosis.

## 2020-02-11 ENCOUNTER — Telehealth: Payer: Self-pay

## 2020-02-11 MED ORDER — TICAGRELOR 90 MG PO TABS: 90.0000 mg | ORAL_TABLET | Freq: Two times a day (BID) | ORAL | 11 refills | Status: DC

## 2020-02-11 NOTE — Telephone Encounter (Signed)
Called pt and left message informing pt that I was leaving her a 30 day free card for her to pick up and take to her pharmacy and that I called her pharmacy and they stated that pt has not used the 30 day free card yet. I advised pt that if she has any other problems, questions or concerns, to give our office a call. FYI

## 2020-02-19 DIAGNOSIS — E1121 Type 2 diabetes mellitus with diabetic nephropathy: Secondary | ICD-10-CM | POA: Diagnosis not present

## 2020-02-19 DIAGNOSIS — C50912 Malignant neoplasm of unspecified site of left female breast: Secondary | ICD-10-CM | POA: Diagnosis not present

## 2020-02-19 DIAGNOSIS — E7800 Pure hypercholesterolemia, unspecified: Secondary | ICD-10-CM | POA: Diagnosis not present

## 2020-02-19 DIAGNOSIS — I129 Hypertensive chronic kidney disease with stage 1 through stage 4 chronic kidney disease, or unspecified chronic kidney disease: Secondary | ICD-10-CM | POA: Diagnosis not present

## 2020-02-19 DIAGNOSIS — I480 Paroxysmal atrial fibrillation: Secondary | ICD-10-CM | POA: Diagnosis not present

## 2020-02-24 ENCOUNTER — Telehealth: Payer: Self-pay

## 2020-02-24 NOTE — Telephone Encounter (Signed)
Signed application sent scanned to P. Via, LPN.

## 2020-02-24 NOTE — Telephone Encounter (Signed)
**Note De-Identified  Obfuscation** [facility] message received from the pts sister and DPR [name] requesting that we complete the provider page of a [place] [facility] [place] pt asst application (she did attach the pts entire application to the [facility] message), have Dr Angelena Form sign it, and to email it back to her.  I have completed the provider page and emailed it to Dr Alyse Low nurse so she can obtain his signature, scan, and to email back to me.

## 2020-02-24 NOTE — Telephone Encounter (Signed)
**Note De-Identified  Obfuscation** I have e-mailed the signed provider page of the pts [place] [facility] [place] pt asst application to [place] at e-mail address provided as requested per [facility] message.

## 2020-02-25 ENCOUNTER — Telehealth (HOSPITAL_COMMUNITY): Payer: Self-pay | Admitting: Radiology

## 2020-02-25 ENCOUNTER — Other Ambulatory Visit (HOSPITAL_COMMUNITY): Payer: Self-pay | Admitting: Interventional Radiology

## 2020-02-25 DIAGNOSIS — I771 Stricture of artery: Secondary | ICD-10-CM

## 2020-02-25 NOTE — Telephone Encounter (Signed)
Called pt, left VM for her to call to schedule CTA head/neck for f/u of stenosis per Deveshwar. JM

## 2020-02-26 NOTE — Telephone Encounter (Signed)
[place] application faxed at this time. [phone].

## 2020-02-27 ENCOUNTER — Other Ambulatory Visit: Payer: Self-pay | Admitting: Geriatric Medicine

## 2020-02-27 DIAGNOSIS — Z1231 Encounter for screening mammogram for malignant neoplasm of breast: Secondary | ICD-10-CM

## 2020-03-02 ENCOUNTER — Other Ambulatory Visit: Payer: Self-pay

## 2020-03-02 ENCOUNTER — Ambulatory Visit
Admission: RE | Admit: 2020-03-02 | Discharge: 2020-03-02 | Disposition: A | Discharge status: Home / Self Care | Payer: Medicare Other | Source: Ambulatory Visit | Attending: Geriatric Medicine | Admitting: Geriatric Medicine

## 2020-03-02 DIAGNOSIS — Z1231 Encounter for screening mammogram for malignant neoplasm of breast: Secondary | ICD-10-CM

## 2020-03-03 ENCOUNTER — Ambulatory Visit (HOSPITAL_COMMUNITY): Admission: RE | Admit: 2020-03-03 | Payer: Medicare Other | Source: Ambulatory Visit

## 2020-03-03 ENCOUNTER — Ambulatory Visit (HOSPITAL_COMMUNITY)
Admission: RE | Admit: 2020-03-03 | Discharge: 2020-03-03 | Disposition: A | Discharge status: Home / Self Care | Payer: Medicare Other | Source: Ambulatory Visit | Attending: Interventional Radiology | Admitting: Interventional Radiology

## 2020-03-03 DIAGNOSIS — E7800 Pure hypercholesterolemia, unspecified: Secondary | ICD-10-CM | POA: Diagnosis not present

## 2020-03-03 DIAGNOSIS — C50912 Malignant neoplasm of unspecified site of left female breast: Secondary | ICD-10-CM | POA: Diagnosis not present

## 2020-03-03 DIAGNOSIS — I6503 Occlusion and stenosis of bilateral vertebral arteries: Secondary | ICD-10-CM | POA: Diagnosis not present

## 2020-03-03 DIAGNOSIS — E1121 Type 2 diabetes mellitus with diabetic nephropathy: Secondary | ICD-10-CM | POA: Diagnosis not present

## 2020-03-03 DIAGNOSIS — N1831 Chronic kidney disease, stage 3a: Secondary | ICD-10-CM | POA: Diagnosis not present

## 2020-03-03 DIAGNOSIS — Z794 Long term (current) use of insulin: Secondary | ICD-10-CM | POA: Diagnosis not present

## 2020-03-03 DIAGNOSIS — I480 Paroxysmal atrial fibrillation: Secondary | ICD-10-CM | POA: Diagnosis not present

## 2020-03-03 DIAGNOSIS — I6521 Occlusion and stenosis of right carotid artery: Secondary | ICD-10-CM | POA: Diagnosis not present

## 2020-03-03 DIAGNOSIS — I6622 Occlusion and stenosis of left posterior cerebral artery: Secondary | ICD-10-CM | POA: Diagnosis not present

## 2020-03-03 DIAGNOSIS — I129 Hypertensive chronic kidney disease with stage 1 through stage 4 chronic kidney disease, or unspecified chronic kidney disease: Secondary | ICD-10-CM | POA: Diagnosis not present

## 2020-03-03 DIAGNOSIS — I771 Stricture of artery: Secondary | ICD-10-CM | POA: Diagnosis not present

## 2020-03-03 DIAGNOSIS — I639 Cerebral infarction, unspecified: Secondary | ICD-10-CM | POA: Diagnosis not present

## 2020-03-03 DIAGNOSIS — I6602 Occlusion and stenosis of left middle cerebral artery: Secondary | ICD-10-CM | POA: Diagnosis not present

## 2020-03-03 LAB — POCT I-STAT CREATININE: Creatinine, Ser: 0.9 mg/dL (ref 0.44–1.00)

## 2020-03-03 MED ORDER — IOHEXOL 350 MG/ML SOLN
75.0000 mL | Freq: Once | INTRAVENOUS | Status: AC | PRN
  Administered 2020-03-03: 75 mL via INTRAVENOUS

## 2020-03-04 ENCOUNTER — Telehealth: Payer: Self-pay | Admitting: Student

## 2020-03-04 ENCOUNTER — Telehealth: Payer: Self-pay | Admitting: Interventional Radiology

## 2020-03-04 NOTE — Telephone Encounter (Signed)
NIR.  Reviewed CTA head/neck results for patient with Dr. Estanislado Pandy. He attempted to call patient's daughter, Aariya Ferrick ([phone]) at 1033 to discuss results, however no answer so VM was left for call back.   Bea Graff Jesenya Bowditch, PA-C 03/04/2020, 10:43 AM

## 2020-03-04 NOTE — Telephone Encounter (Signed)
Discussed results of the recent  CTA of the head and neck with daughter(Carla) on the telephone. Lt middle cerebral artery branch severe stenosis noted to be stable. Worsening of stenosis of the Rt vertebral artery at the skull base noted. Clinically patient remains stable as per daughter. Plan .  CTA of the head and neck in 6 months. Continue on present meds for secondary stroke prevention. Advised to call 911 for new neurological  symptoms . Daughter expressed understanding and agreement with the above management plan. S.Rilee Knoll MD

## 2020-03-04 NOTE — Telephone Encounter (Signed)
ERROR

## 2020-03-11 NOTE — Telephone Encounter (Signed)
Informed pt's daughter that we would be leaving 4 bottles of Brilinta 90 mg tablets of samples at the front desk to pick up and if pt has any other problems, questions or concerns, to give our office a call. Daughter verbalized understanding.   Lot# [id number]  Exp: [phone]

## 2020-03-25 DIAGNOSIS — N398 Other specified disorders of urinary system: Secondary | ICD-10-CM | POA: Diagnosis not present

## 2020-03-25 DIAGNOSIS — N8111 Cystocele, midline: Secondary | ICD-10-CM | POA: Diagnosis not present

## 2020-03-25 DIAGNOSIS — Z789 Other specified health status: Secondary | ICD-10-CM | POA: Diagnosis not present

## 2020-04-01 DIAGNOSIS — E7800 Pure hypercholesterolemia, unspecified: Secondary | ICD-10-CM | POA: Diagnosis not present

## 2020-04-01 DIAGNOSIS — I129 Hypertensive chronic kidney disease with stage 1 through stage 4 chronic kidney disease, or unspecified chronic kidney disease: Secondary | ICD-10-CM | POA: Diagnosis not present

## 2020-04-01 DIAGNOSIS — E1121 Type 2 diabetes mellitus with diabetic nephropathy: Secondary | ICD-10-CM | POA: Diagnosis not present

## 2020-04-01 DIAGNOSIS — I480 Paroxysmal atrial fibrillation: Secondary | ICD-10-CM | POA: Diagnosis not present

## 2020-04-01 DIAGNOSIS — C50912 Malignant neoplasm of unspecified site of left female breast: Secondary | ICD-10-CM | POA: Diagnosis not present

## 2020-04-01 DIAGNOSIS — N1831 Chronic kidney disease, stage 3a: Secondary | ICD-10-CM | POA: Diagnosis not present

## 2020-04-02 ENCOUNTER — Telehealth: Payer: Self-pay

## 2020-04-02 NOTE — Telephone Encounter (Signed)
Per Margaretmary Dys, RPH, I called pt's daughter Rachel Santos and left message informing her that I would be leaving 1 bottle of Xarelto 15 mg tablet at the front desk for her to pick up and that they needed to contact PCP if medication is too high, to discuss a cheaper medication, because pt's PCP follows pt for Xarelto and if they had any other problems, questions or concerns, to give our office a call back. Lot# [id number]  Exp: 1/22

## 2020-04-02 NOTE — Telephone Encounter (Signed)
Per Megan Supple, RPH, I called pt's daughter Carla and left message informing her that I would be leaving 1 bottle of Xarelto 15 mg tablet at the front desk for her to pick up and that they needed to contact PCP if medication is too high, to discuss a cheaper medication, because pt's PCP follows pt for Xarelto and if they had any other problems, questions or concerns, to give our office a call back. Lot# [id number]  Exp: 1/22 

## 2020-04-08 DIAGNOSIS — E1121 Type 2 diabetes mellitus with diabetic nephropathy: Secondary | ICD-10-CM | POA: Diagnosis not present

## 2020-04-08 DIAGNOSIS — D6869 Other thrombophilia: Secondary | ICD-10-CM | POA: Diagnosis not present

## 2020-04-08 DIAGNOSIS — Z794 Long term (current) use of insulin: Secondary | ICD-10-CM | POA: Diagnosis not present

## 2020-04-08 DIAGNOSIS — N1831 Chronic kidney disease, stage 3a: Secondary | ICD-10-CM | POA: Diagnosis not present

## 2020-04-08 DIAGNOSIS — I129 Hypertensive chronic kidney disease with stage 1 through stage 4 chronic kidney disease, or unspecified chronic kidney disease: Secondary | ICD-10-CM | POA: Diagnosis not present

## 2020-04-08 DIAGNOSIS — I480 Paroxysmal atrial fibrillation: Secondary | ICD-10-CM | POA: Diagnosis not present

## 2020-04-13 DIAGNOSIS — E1121 Type 2 diabetes mellitus with diabetic nephropathy: Secondary | ICD-10-CM | POA: Diagnosis not present

## 2020-04-13 DIAGNOSIS — Z794 Long term (current) use of insulin: Secondary | ICD-10-CM | POA: Diagnosis not present

## 2020-04-15 NOTE — Telephone Encounter (Signed)
Medication, Brilinta was already sent to pt's pharmacy 11/21 with a year supply. Xarelto is refill with pt's PCP, pt would have to request refills for Xarelto, with PCP. Thanks

## 2020-04-15 NOTE — Telephone Encounter (Signed)
Thanks

## 2020-05-08 DIAGNOSIS — C50912 Malignant neoplasm of unspecified site of left female breast: Secondary | ICD-10-CM | POA: Diagnosis not present

## 2020-05-08 DIAGNOSIS — E1121 Type 2 diabetes mellitus with diabetic nephropathy: Secondary | ICD-10-CM | POA: Diagnosis not present

## 2020-05-08 DIAGNOSIS — E7800 Pure hypercholesterolemia, unspecified: Secondary | ICD-10-CM | POA: Diagnosis not present

## 2020-05-08 DIAGNOSIS — N1831 Chronic kidney disease, stage 3a: Secondary | ICD-10-CM | POA: Diagnosis not present

## 2020-05-08 DIAGNOSIS — I129 Hypertensive chronic kidney disease with stage 1 through stage 4 chronic kidney disease, or unspecified chronic kidney disease: Secondary | ICD-10-CM | POA: Diagnosis not present

## 2020-05-08 DIAGNOSIS — I480 Paroxysmal atrial fibrillation: Secondary | ICD-10-CM | POA: Diagnosis not present

## 2020-05-26 DIAGNOSIS — C50912 Malignant neoplasm of unspecified site of left female breast: Secondary | ICD-10-CM | POA: Diagnosis not present

## 2020-05-26 DIAGNOSIS — N1831 Chronic kidney disease, stage 3a: Secondary | ICD-10-CM | POA: Diagnosis not present

## 2020-05-26 DIAGNOSIS — E1121 Type 2 diabetes mellitus with diabetic nephropathy: Secondary | ICD-10-CM | POA: Diagnosis not present

## 2020-05-26 DIAGNOSIS — I480 Paroxysmal atrial fibrillation: Secondary | ICD-10-CM | POA: Diagnosis not present

## 2020-05-26 DIAGNOSIS — I129 Hypertensive chronic kidney disease with stage 1 through stage 4 chronic kidney disease, or unspecified chronic kidney disease: Secondary | ICD-10-CM | POA: Diagnosis not present

## 2020-05-26 DIAGNOSIS — E7800 Pure hypercholesterolemia, unspecified: Secondary | ICD-10-CM | POA: Diagnosis not present

## 2020-05-30 IMAGING — CT CT ANGIO NECK
1 of 11 series · 5 of 33 positions shown · IV contrast (OMNI 350)
Comparison: Report from catheter based angiography 05/21/2019, CT
angiogram head 04/22/2019, MRI/MRA head 01/24/2019.

CLINICAL DATA: Stenosis of artery. Additional history provided by
scanning technologist: Carotid stenosis

EXAM:
CT ANGIOGRAPHY HEAD AND NECK
TECHNIQUE: Multidetector CT imaging of the head and neck was performed using
the standard protocol during bolus administration of intravenous
contrast. Multiplanar CT image reconstructions and MIPs were
obtained to evaluate the vascular anatomy. Carotid stenosis
measurements (when applicable) are obtained utilizing NASCET
criteria, using the distal internal carotid diameter as the
denominator.
CONTRAST:  75mL OMNIPAQUE IOHEXOL 350 MG/ML SOLN

[Series 11: cta neck axial · axial · 0.34mm/px · z∈[-316,-91]mm · 5 of 346 slices shown]
[im 58/346  soft-tissue]
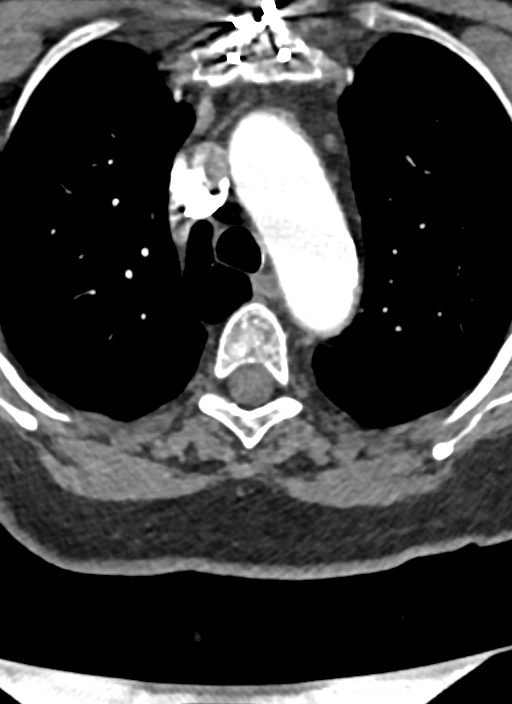
[im 116/346  bone]
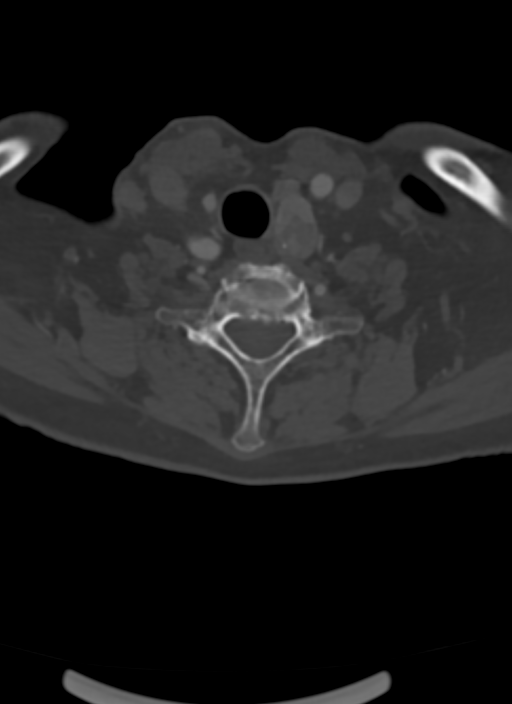
[im 173/346  soft-tissue]
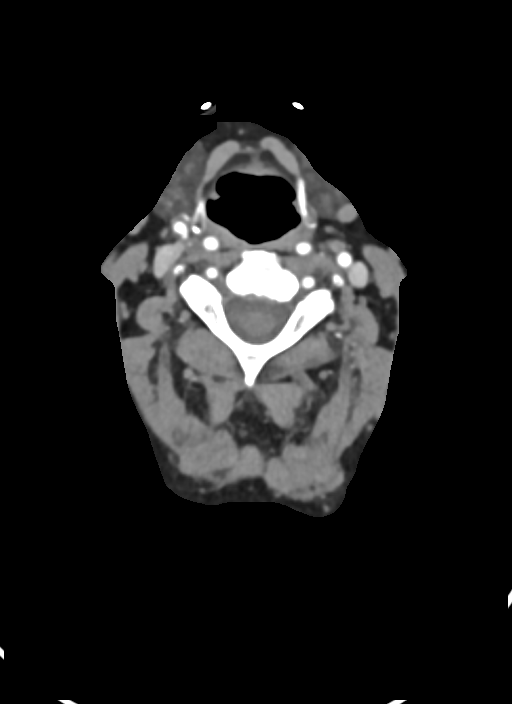
[im 231/346  bone]
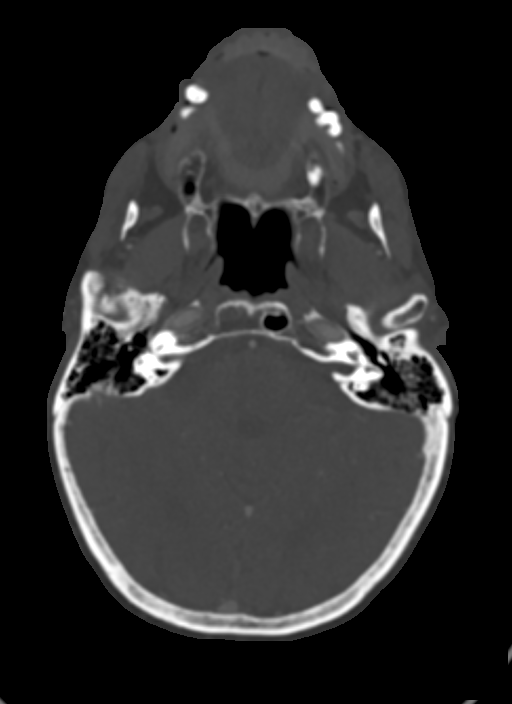
[im 288/346  soft-tissue]
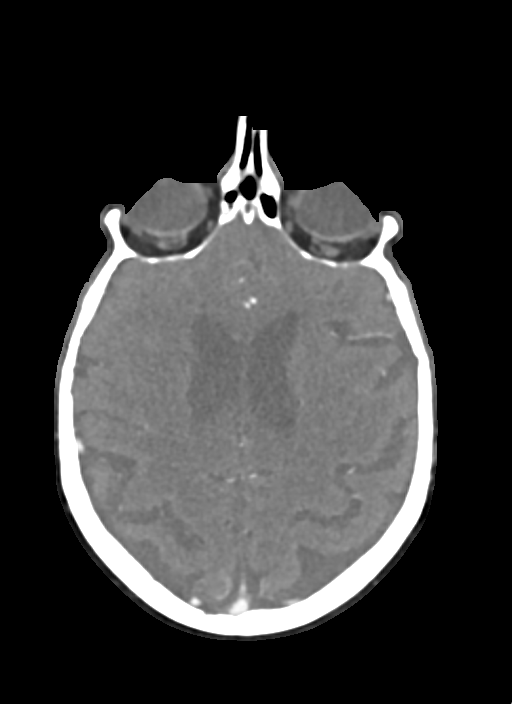

[5 of 33 positions shown; findings below may reference images not displayed]

FINDINGS: CT HEAD FINDINGS

Brain: There is no evidence of acute intracranial hemorrhage,
intracranial mass, midline shift or extra-axial fluid collection.No
demarcated cortical infarct is identified. Known tiny chronic
infarcts within the bilateral cerebral hemispheres and left
cerebellum were better appreciated on prior MRI 01/24/2019 (acute at
that time). Redemonstrated small chronic lacunar infarct in the
right cerebellum. Stable, mild generalized parenchymal atrophy

Vascular: Reported below.

Skull: Normal. Negative for fracture or focal lesion.

Sinuses: No significant paranasal sinus disease or mastoid effusion.

Orbits: No acute abnormality.

Review of the MIP images confirms the above findings

CTA NECK FINDINGS

Aortic arch: Standard aortic branching. Atherosclerotic plaque
within the visualized aortic arch and proximal major branch vessels
of the neck. No hemodynamically significant stenosis of the
innominate or proximal subclavian arteries.

Right carotid system: The CCA and ICA are patent within the neck
without measurable stenosis. Mild mixed plaque within the mid CCA
and within the carotid bifurcation.

Left carotid system: CCA and ICA patent within the neck without
measurable stenosis. Mild mixed plaque within the carotid
bifurcation and proximal ICA. There is also mild focal soft plaque
within the distal cervical ICA.

Vertebral arteries: The vertebral arteries are patent within the
neck bilaterally without significant stenosis (50% or greater).

Skeleton: No acute bony abnormality or aggressive osseous lesion.
Cervical spondylosis with multilevel disc height loss, posterior
disc osteophytes, uncovertebral and facet hypertrophy.

Other neck: 18 mm nodule within the inferior left thyroid lobe.

Upper chest: No consolidation within the imaged lung apices. Prior
median sternotomy

Review of the MIP images confirms the above findings

CTA HEAD FINDINGS

Anterior circulation:

The intracranial internal carotid arteries are patent. Unchanged
calcified plaque within the cavernous and paraclinoid segments with
moderate stenosis on the right and mild stenosis on the left.

The M1 middle cerebral arteries are patent without significant
stenosis. Redemonstrated short-segment near occlusive or occlusive
stenosis of a proximal left M2 MCA branch (series 14, image 19). As
before, there is distal flow within this vessel. No other M2
proximal branch occlusion or high-grade proximal stenosis is
identified.

The anterior cerebral arteries are patent without significant
proximal stenosis.

No intracranial aneurysm is identified.

Posterior circulation:

The intracranial vertebral arteries are patent. Calcified plaque at
the V3-V4 transitions bilaterally with no more than mild stenosis.
Unchanged focal stenosis within the proximal basilar artery with
estimated stenosis of up to 50%. The posterior cerebral arteries are
patent bilaterally. Unchanged sites of moderate/severe stenosis
within the P2 left posterior cerebral artery. Unchanged sites of
mild stenosis within the right P2 posterior cerebral artery. A left
posterior communicating artery is present. A right posterior
communicating artery is poorly delineated and may be hypoplastic or
absent.

Venous sinuses: Within limitations of contrast timing, no convincing
thrombus.

Anatomic variants: As described

Review of the MIP images confirms the above findings
IMPRESSION: CT head:

1. No evidence of acute intracranial abnormality.
2. Redemonstrated small chronic lacunar infarct within the right
cerebellum.
3. Known tiny scattered chronic infarcts in the bilateral cerebral
hemispheres and left cerebellum are poorly appreciated by CT.

CTA neck:

1. Atherosclerotic plaque within the visualized aortic arch and
major branch vessels of the neck as described.
2. The bilateral common carotid, internal carotid and vertebral
arteries are patent within without significant stenosis.
3. 18 mm nodule within the inferior left thyroid lobe. If not
already performed, thyroid ultrasound is recommended for further
evaluation.

CTA head:

1. Unchanged appearance of the intracranial arterial vasculature as
compared to CTA head 04/22/2019.
2. Short segment near occlusive or occlusive stenosis of a proximal
left M2 MCA branch.
3. Calcified plaque within the cavernous and paraclinoid ICAs. Sites
of moderate stenosis on the right and mild stenosis on the left.
4. Focal atherosclerotic narrowing of the proximal basilar artery
estimated at up to 50%.
5. Multifocal moderate/severe stenoses within the P2 left posterior
cerebral artery.
6. Mild stenosis within the P2 right posterior cerebral artery and
V3/V4 vertebral arteries.

## 2020-07-13 NOTE — Telephone Encounter (Signed)
Per Dr. Angelena Form: I would not load with Plavix at this point. I would just start Plavix 75 mg on the day after she finishes the Brilinta. Rachel Santos

## 2020-07-14 MED ORDER — CLOPIDOGREL BISULFATE 75 MG PO TABS: 75.0000 mg | ORAL_TABLET | Freq: Every day | ORAL | 3 refills | Status: DC

## 2020-07-14 NOTE — Telephone Encounter (Signed)
Discontinued Brilinta.  Sent prescription for Plavix 75 mg daily to her pharmacy.

## 2020-07-22 DIAGNOSIS — N1831 Chronic kidney disease, stage 3a: Secondary | ICD-10-CM | POA: Diagnosis not present

## 2020-07-22 DIAGNOSIS — E1121 Type 2 diabetes mellitus with diabetic nephropathy: Secondary | ICD-10-CM | POA: Diagnosis not present

## 2020-07-22 DIAGNOSIS — I480 Paroxysmal atrial fibrillation: Secondary | ICD-10-CM | POA: Diagnosis not present

## 2020-07-22 DIAGNOSIS — E7800 Pure hypercholesterolemia, unspecified: Secondary | ICD-10-CM | POA: Diagnosis not present

## 2020-07-22 DIAGNOSIS — K219 Gastro-esophageal reflux disease without esophagitis: Secondary | ICD-10-CM | POA: Diagnosis not present

## 2020-07-22 DIAGNOSIS — C50912 Malignant neoplasm of unspecified site of left female breast: Secondary | ICD-10-CM | POA: Diagnosis not present

## 2020-07-22 DIAGNOSIS — I129 Hypertensive chronic kidney disease with stage 1 through stage 4 chronic kidney disease, or unspecified chronic kidney disease: Secondary | ICD-10-CM | POA: Diagnosis not present

## 2020-08-09 ENCOUNTER — Encounter: Payer: Self-pay | Admitting: Physician Assistant

## 2020-08-09 NOTE — Progress Notes (Deleted)
Cardiology Office Note    Date:  08/09/2020   [name], DOB [birth year], MRN [id number]  PCP:  Rachel Manes, MD  Cardiologist:  Rachel Chandler, MD  Electrophysiologist:  None   Chief Complaint: ***  History of Present Illness:   Rachel Santos is a 80 y.o. 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  She was remotely diagnosed with afib in 2014 and underwent initial valvular surgery that year as above. early postoperative recovery while in the hospital was notable for rate controlled atrial fibrllation, bradycardia, and junctional rhythm - seen by EP who did not feel PPM warranted. In more recent years she was found to have progressive aortic stenosis for with TAVR was recommended. Pre-TAVR cath had shown mild nonobstructive CAD in the LAD and CX with moderate nononstructive disease in the mid-distal RCA and PDA. Rachel Santos's note from 01/2020 reviewed. Per chart, She underwent successful TAVR 01/22/19. Unfortunately,her TAVR was complicated by anextensive right groin hematomarequiring surgical repair and blood transfusions as well as cardioembolic CVA. She was restarted on Xarelto. She was readmitted to [facility] 01/2019 with groin pain, ABL anemia requiring transfusion, and recurrent aphasia. CT abdomen pelvis showed a right inguinal hematoma and small right pelvic hematoma. CT head showed no acuteabnormality but MRI brain showed several small acute cortical-based infarcts in the left frontal, parietal and occipital lobes which may represent an embolic source. CT angio of the head and neck showed severe narrowing of the left middle cerebral artery and moderate to severe narrowing of the left posterior cerebral artery.She was evaluated by  neurology and underwent full stroke work-up. Her stroke was felt to be most likely relatedtoher intracranial atherosclerotic disease and a baby aspirin was added to her Xarelto.Cardiac monitor March 2021 showed sinus with PACs.She then underwent attempted treatment of a left MCA stenosis (per patient- no intervention was completed) and was started on Brilinta by Rachel Santos. ASA was stopped. Rachel Santos subsequently authorized a change from Brilinta to Plavix. Last echo 01/2020 EF 60-65%, grade 2 DD, mod LAE, s/p TV repair/MV repair with trivial TR and mild MS, TAVR with trivial perivalvular leakage. Per structural heart team, this was felt to be a normally functioning valve.   plavix is not really from [place] - Rachel Santos's office? Labs updated for anticoag SBE PPX  Severe AS s/p TAVR History of MV/TV repair Paroxysmal atrial fibrillation CAD Cerebrovascular disease  Labwork independently reviewed: 02/2020 Cr 0.9 06/2019 Hgb 13.4  Past Medical History:  Diagnosis Date  . Acute blood loss anemia, transfused 1 unit PRBC 01/26/19 01/27/2019  . Anxiety   . Arthritis    knees  . Atrial fibrillation ([place])   . Cancer ([place])    left breast  . Cataracts, bilateral   . Depression   . Diabetes mellitus   . Dyspnea    with exertion  . Full dentures   . GERD (gastroesophageal reflux disease)   . Hearing aid worn    B/L  . History of TIA (transient ischemic attack)    mini stroke with vision problems  . Hypertension   . Middle cerebral artery stenosis, left   . Mitral regurgitation   . Pneumonia   . PONV (postoperative nausea and vomiting)   . S/P Maze operation for atrial fibrillation 10/11/2012  Complete bilateral atrial lesion set using bipolar radiofrequency and cryothermy ablation with clipping of LA appendage  . 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   42mm Sorin Memo 3D ring annuloplasty with 26 mm Edwards mc3 tricuspid ring annuloplasty   . S/P TAVR (transcatheter aortic valve  replacement) 01/22/2019   s/p TAVR with a 23 Edwards Sapien 3 Ultra THV via the TF approach  . Thyroid nodule   . Tricuspid regurgitation   . Wears glasses     Past Surgical History:  Procedure Laterality Date  . ARTERY REPAIR Right 01/22/2019   Procedure: Right Common Femoral Artery Repair;  Surgeon: Angelia Mould, MD;  Location: [place];  Service: Vascular;  Laterality: Right;  . CARDIAC CATHETERIZATION  09-05-12  . CARDIOVERSION N/A 07/31/2012   Procedure: CARDIOVERSION;  Surgeon: Josue Hector, MD;  Location: [place];  Service: Cardiovascular;  Laterality: N/A;  . CATARACT EXTRACTION W/ INTRAOCULAR LENS  IMPLANT, BILATERAL    . CESAREAN SECTION     x 2  . COLONOSCOPY WITH PROPOFOL N/A 11/28/2014   Procedure: COLONOSCOPY WITH PROPOFOL;  Surgeon: Carol Ada, MD;  Location: [facility];  Service: Endoscopy;  Laterality: N/A;  . DILATION AND CURETTAGE OF UTERUS    . FEMORAL ARTERY EXPLORATION Right 01/22/2019   Procedure: RIGHT GROIN EXPLORATION with evacuation of hematoma;  Surgeon: Angelia Mould, MD;  Location: [facility];  Service: Vascular;  Laterality: Right;  . FLEXIBLE SIGMOIDOSCOPY N/A 12/18/2015   Procedure: FLEXIBLE SIGMOIDOSCOPY;  Surgeon: Carol Ada, MD;  Location: [facility];  Service: Endoscopy;  Laterality: N/A;  . INTRAOPERATIVE TRANSESOPHAGEAL ECHOCARDIOGRAM N/A 10/11/2012   Procedure: INTRAOPERATIVE TRANSESOPHAGEAL ECHOCARDIOGRAM;  Surgeon: Rexene Alberts, MD;  Location: [place];  Service: Open Heart Surgery;  Laterality: N/A;  . IR ANGIO INTRA EXTRACRAN SEL COM CAROTID INNOMINATE BILAT MOD SED  05/21/2019  . IR ANGIO VERTEBRAL SEL VERTEBRAL BILAT MOD SED  05/21/2019  . MASTECTOMY Left 1982  . MAZE N/A 10/11/2012   Procedure: MAZE;  Surgeon: Rexene Alberts, MD;  Location: [place];  Service: Open Heart Surgery;  Laterality: N/A;  . MITRAL VALVE REPAIR N/A 10/11/2012   Procedure: MITRAL VALVE REPAIR (MVR);  Surgeon: Rexene Alberts, MD;  Location: [place];   Service: Open Heart Surgery;  Laterality: N/A;  . MULTIPLE TOOTH EXTRACTIONS    . RADIOLOGY WITH ANESTHESIA N/A 06/19/2019   Procedure: STENTING;  Surgeon: Luanne Bras, MD;  Location: [place];  Service: Radiology;  Laterality: N/A;  . RIGHT HEART CATH AND CORONARY ANGIOGRAPHY N/A 12/24/2018   Procedure: RIGHT HEART CATH AND CORONARY ANGIOGRAPHY;  Surgeon: [name], MD;  Location: [place] [facility];  Service: Cardiovascular;  Laterality: N/A;  . TEE WITHOUT CARDIOVERSION N/A 07/31/2012   Procedure: TRANSESOPHAGEAL ECHOCARDIOGRAM (TEE);  Surgeon: Josue Hector, MD;  Location: [place];  Service: Cardiovascular;  Laterality: N/A;  . TEE WITHOUT CARDIOVERSION N/A 01/22/2019   Procedure: TRANSESOPHAGEAL ECHOCARDIOGRAM (TEE);  Surgeon: [name], MD;  Location: [place] [facility];  Service: Open Heart Surgery;  Laterality: N/A;  . TRANSCATHETER AORTIC VALVE REPLACEMENT, TRANSFEMORAL N/A 01/22/2019   Procedure: TRANSCATHETER AORTIC VALVE REPLACEMENT, TRANSFEMORAL;  Surgeon: [name], MD;  Location: [place] [facility];  Service: Open Heart Surgery;  Laterality: N/A;  . TRICUSPID VALVE REPLACEMENT N/A 10/11/2012   Procedure: TRICUSPID VALVE REPAIR;  Surgeon: Rexene Alberts, MD;  Location: [place];  Service: Open Heart Surgery;  Laterality: N/A;  . TUBAL LIGATION  Current Medications: No outpatient medications have been marked as taking for the 08/11/20 encounter (Appointment) with Charlie Pitter, PA-C.   ***   Allergies:   Metformin and related, Ultram [tramadol], Nickel, and Pneumococcal vaccines   Social History   Socioeconomic History  . Marital status: Widowed    Spouse name: Not on file  . Number of children: 3  . Years of education: Not on file  . Highest education level: Not on file  Occupational History  . Occupation: Air cabin crew  Tobacco Use  . Smoking status: Never Smoker  . Smokeless tobacco: Never Used   Vaping Use  . Vaping Use: Never used  Substance and Sexual Activity  . Alcohol use: No  . Drug use: No  . Sexual activity: Never  Other Topics Concern  . Not on file  Social History Narrative  . Not on file   Social Determinants of Health   Financial Resource Strain: Not on file  Food Insecurity: Not on file  Transportation Needs: Not on file  Physical Activity: Not on file  Stress: Not on file  Social Connections: Not on file     Family History:  The patient's ***family history includes Alcohol abuse in her mother; Cancer in her father; Heart attack in her paternal grandfather; Hypertension in her father. There is no history of Stroke.  ROS:   Please see the history of present illness. Otherwise, review of systems is positive for ***.  All other systems are reviewed and otherwise negative.    EKGs/Labs/Other Studies Reviewed:    Studies reviewed are outlined and summarized above. Reports included below if pertinent.  2D Echo 01/2020    1. Left ventricular ejection fraction, by estimation, is 60 to 65%. The  left ventricle has normal function. The left ventricle has no regional  wall motion abnormalities. There is mild left ventricular hypertrophy.  Left ventricular diastolic parameters  are consistent with Grade II diastolic dysfunction (pseudonormalization).  2. Right ventricular systolic function is normal. The right ventricular  size is normal. Tricuspid regurgitation signal is inadequate for assessing  PA pressure.  3. Left atrial size was moderately dilated.  4. Status post tricuspid valve repair. Trivial tricuspid regurgitation.  No significant stenosis with mean gradient 2 mmHg.  5. Status post mitral valve repair. Trivial mitral regurgitation. Mean  gradient 4 mmHg, mild mitral stenosis.  6. Bioprosthetic aortic valve s/p TAVR (23 mm Edwards Sapien). Trivial  peri-valvular leakage. Mean gradient 16 mmHg, mildly elevated.  7. The inferior vena cava  is normal in size with greater than 50%  respiratory variability, suggesting right atrial pressure of 3 mmHg.   [facility] 11/2018   Mid RCA lesion is 50% stenosed.  RPDA lesion is 50% stenosed.  Prox RCA lesion is 30% stenosed.  Mid Cx lesion is 20% stenosed.  Ost LAD to Prox LAD lesion is 40% stenosed.  Mid LAD lesion is 30% stenosed.   1. Mild non-obstructive disease in the LAD and Circumflex 2. Moderate non-obstructive disease in the mid and distal RCA, PDA.  3. Severe aortic stenosis by echo. Unable to cross the aortic valve in the cath lab from the radial approach.   Recommendations: Continue workup for TAVR. Medical management of non-obstructive CAD.      EKG:  EKG is ordered today, personally reviewed, demonstrating ***  Recent Labs: 03/03/2020: Creatinine, Ser 0.90  Recent Lipid Panel    Component Value Date/Time   CHOL 112 01/23/2019 1530   TRIG 149 01/23/2019 1530  HDL 38 (L) 01/23/2019 1530   CHOLHDL 2.9 01/23/2019 1530   VLDL 30 01/23/2019 1530   LDLCALC 44 01/23/2019 1530    PHYSICAL EXAM:    VS:  There were no vitals taken for this visit.  BMI: There is no height or weight on file to calculate BMI.  GEN: Well nourished, well developed female in no acute distress HEENT: normocephalic, atraumatic Neck: no JVD, carotid bruits, or masses Cardiac: ***RRR; no murmurs, rubs, or gallops, no edema  Respiratory:  clear to auscultation bilaterally, normal work of breathing GI: soft, nontender, nondistended, + BS MS: no deformity or atrophy Skin: warm and dry, no rash Neuro:  Alert and Oriented x 3, Strength and sensation are intact, follows commands Psych: euthymic mood, full affect  Wt Readings from Last 3 Encounters:  01/30/20 136 lb (61.7 kg)  11/20/19 133 lb (60.3 kg)  07/16/19 137 lb 6.4 oz (62.3 kg)     ASSESSMENT & PLAN:   1. ***  Disposition: F/u with ***   Medication Adjustments/Labs and Tests Ordered: Current medicines are reviewed at  length with the patient today.  Concerns regarding medicines are outlined above. Medication changes, Labs and Tests ordered today are summarized above and listed in the Patient Instructions accessible in Encounters.   Signed, Charlie Pitter, PA-C  08/09/2020 10:17 AM    [address], [place], [place]  [address] Phone: [phone]; Fax: [phone]

## 2020-08-11 ENCOUNTER — Ambulatory Visit: Payer: Medicare Other | Admitting: Physician Assistant

## 2020-08-11 DIAGNOSIS — I679 Cerebrovascular disease, unspecified: Secondary | ICD-10-CM

## 2020-08-11 DIAGNOSIS — Z9889 Other specified postprocedural states: Secondary | ICD-10-CM

## 2020-08-11 DIAGNOSIS — I480 Paroxysmal atrial fibrillation: Secondary | ICD-10-CM

## 2020-08-11 DIAGNOSIS — I2510 Atherosclerotic heart disease of native coronary artery without angina pectoris: Secondary | ICD-10-CM

## 2020-08-11 DIAGNOSIS — Z952 Presence of prosthetic heart valve: Secondary | ICD-10-CM

## 2020-08-19 DIAGNOSIS — D6869 Other thrombophilia: Secondary | ICD-10-CM | POA: Diagnosis not present

## 2020-08-19 DIAGNOSIS — K219 Gastro-esophageal reflux disease without esophagitis: Secondary | ICD-10-CM | POA: Diagnosis not present

## 2020-08-19 DIAGNOSIS — I480 Paroxysmal atrial fibrillation: Secondary | ICD-10-CM | POA: Diagnosis not present

## 2020-08-19 DIAGNOSIS — Z23 Encounter for immunization: Secondary | ICD-10-CM | POA: Diagnosis not present

## 2020-08-19 DIAGNOSIS — E7800 Pure hypercholesterolemia, unspecified: Secondary | ICD-10-CM | POA: Diagnosis not present

## 2020-08-19 DIAGNOSIS — I129 Hypertensive chronic kidney disease with stage 1 through stage 4 chronic kidney disease, or unspecified chronic kidney disease: Secondary | ICD-10-CM | POA: Diagnosis not present

## 2020-08-19 DIAGNOSIS — Z0000 Encounter for general adult medical examination without abnormal findings: Secondary | ICD-10-CM | POA: Diagnosis not present

## 2020-08-19 DIAGNOSIS — Z79899 Other long term (current) drug therapy: Secondary | ICD-10-CM | POA: Diagnosis not present

## 2020-08-19 DIAGNOSIS — N1831 Chronic kidney disease, stage 3a: Secondary | ICD-10-CM | POA: Diagnosis not present

## 2020-08-19 DIAGNOSIS — K9089 Other intestinal malabsorption: Secondary | ICD-10-CM | POA: Diagnosis not present

## 2020-08-19 DIAGNOSIS — E1121 Type 2 diabetes mellitus with diabetic nephropathy: Secondary | ICD-10-CM | POA: Diagnosis not present

## 2020-08-21 DIAGNOSIS — E1165 Type 2 diabetes mellitus with hyperglycemia: Secondary | ICD-10-CM | POA: Diagnosis not present

## 2020-08-25 DIAGNOSIS — N1831 Chronic kidney disease, stage 3a: Secondary | ICD-10-CM | POA: Diagnosis not present

## 2020-08-25 DIAGNOSIS — I480 Paroxysmal atrial fibrillation: Secondary | ICD-10-CM | POA: Diagnosis not present

## 2020-08-25 DIAGNOSIS — I129 Hypertensive chronic kidney disease with stage 1 through stage 4 chronic kidney disease, or unspecified chronic kidney disease: Secondary | ICD-10-CM | POA: Diagnosis not present

## 2020-08-25 DIAGNOSIS — E7800 Pure hypercholesterolemia, unspecified: Secondary | ICD-10-CM | POA: Diagnosis not present

## 2020-08-25 DIAGNOSIS — E1121 Type 2 diabetes mellitus with diabetic nephropathy: Secondary | ICD-10-CM | POA: Diagnosis not present

## 2020-08-25 DIAGNOSIS — K219 Gastro-esophageal reflux disease without esophagitis: Secondary | ICD-10-CM | POA: Diagnosis not present

## 2020-09-08 ENCOUNTER — Other Ambulatory Visit: Payer: Self-pay

## 2020-09-08 ENCOUNTER — Ambulatory Visit (INDEPENDENT_AMBULATORY_CARE_PROVIDER_SITE_OTHER): Payer: Medicare Other | Admitting: Physician Assistant

## 2020-09-08 ENCOUNTER — Encounter: Payer: Self-pay | Admitting: Physician Assistant

## 2020-09-08 VITALS — BP 112/68 | HR 73 | Ht 66.0 in | Wt 141.0 lb

## 2020-09-08 DIAGNOSIS — I480 Paroxysmal atrial fibrillation: Secondary | ICD-10-CM | POA: Diagnosis not present

## 2020-09-08 DIAGNOSIS — I2510 Atherosclerotic heart disease of native coronary artery without angina pectoris: Secondary | ICD-10-CM | POA: Diagnosis not present

## 2020-09-08 DIAGNOSIS — Z9889 Other specified postprocedural states: Secondary | ICD-10-CM | POA: Diagnosis not present

## 2020-09-08 DIAGNOSIS — E785 Hyperlipidemia, unspecified: Secondary | ICD-10-CM

## 2020-09-08 DIAGNOSIS — I10 Essential (primary) hypertension: Secondary | ICD-10-CM | POA: Diagnosis not present

## 2020-09-08 DIAGNOSIS — R072 Precordial pain: Secondary | ICD-10-CM

## 2020-09-08 DIAGNOSIS — Z952 Presence of prosthetic heart valve: Secondary | ICD-10-CM

## 2020-09-08 DIAGNOSIS — Z8673 Personal history of transient ischemic attack (TIA), and cerebral infarction without residual deficits: Secondary | ICD-10-CM | POA: Diagnosis not present

## 2020-09-08 DIAGNOSIS — I38 Endocarditis, valve unspecified: Secondary | ICD-10-CM

## 2020-09-08 MED ORDER — ATORVASTATIN CALCIUM 80 MG PO TABS: 80.0000 mg | ORAL_TABLET | Freq: Every day | ORAL | 11 refills | Status: DC

## 2020-09-08 MED ORDER — RIVAROXABAN 15 MG PO TABS: 15.0000 mg | ORAL_TABLET | Freq: Every day | ORAL | 11 refills | Status: DC

## 2020-09-08 MED ORDER — NITROGLYCERIN 0.4 MG SL SUBL: 0.4000 mg | SUBLINGUAL_TABLET | SUBLINGUAL | 3 refills | Status: AC | PRN

## 2020-09-08 NOTE — Progress Notes (Signed)
Cardiology Office Note:    Date:  09/08/2020   ID:  Deliah Boston Julio, DOB Feb 15, 1941, MRN [id number]  PCP:  Lajean Manes, MD   [facility] Providers Cardiologist:  Lauree Chandler, MD      Referring MD: Lajean Manes, MD   Chief Complaint:  Follow-up (CAD, valvular heart disease, AFib )    Patient Profile:    Rachel Santos is a 80 y.o. 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 to [place] Diabetes mellitus Hypertension  Hyperlipidemia  Breast CA   Prior CV studies:  Echocardiogram 01/30/2020 EF 60-65, no RWMA, mild LVH, Gr 2 DD, normal RVSF, mod LAE, trivial TR (mean 2 mmHg), s/p MV repair w trivial MR and mean 4 mmHg, s/p TAVR w trivial PVL and mean 16 mmHg   CARDIAC TELEMETRY MONITORING-INTERPRETATION ONLY 07/02/2019 Narrative Sinus rhythm with premature atrial contractions (PACs)  CT CORONARY MORPH W/CTA COR W/SCORE W/CA W/CM &/OR WO/CM 01/02/2019 IMPRESSION: 1. Calcified tri leaflet AV with annular area of 417 mm2 suitable for a 23 mm Sapien 3 valve 2.  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  Coronary arteries sufficient height above annulus for deployment 4. Optimum angiographic angle for deployment LAO 19 Cranial 2 degrees 5. Patient is post MV annuloplasty and TV annuloplasty repair LAA has been clipped closed with no residual communication with LA  VAS US CAROTID DUPLEX BILATERAL 01/02/2019 Bilateral ICA 1-39   RIGHT HEART CATH AND CORONARY ANGIOGRAPHY 12/24/2018 Narrative   Mid RCA lesion is 50% stenosed.  RPDA lesion is 50% stenosed.  Prox RCA lesion is 30% stenosed.  Mid Cx lesion is 20% stenosed.  Ost LAD to Prox LAD lesion is 40% stenosed.  Mid LAD lesion is 30% stenosed. 1. Mild non-obstructive disease in the LAD and Circumflex 2. Moderate non-obstructive disease in the mid and distal RCA, PDA. 3. Severe aortic stenosis by echo. Unable to cross the aortic valve in the cath lab from the radial approach. Recommendations: Continue workup for TAVR. Medical management of non-obstructive CAD.   History of Present Illness: Ms. Hascall was last seen in 11/21 by Angelena Form, PA-C.  She returns for f/u.   She is here with her daughter. She notes an occasional chest pain on her L side.  She has no associated symptoms. This usually occurs at rest and only lasts seconds.  She has not noted any significant worsening. She has chronic dyspnea on exertion.  This is unchanged.  She has not had orthopnea, leg edema, syncope.        Past Medical History:  Diagnosis Date   Anxiety    Arthritis    knees   Atrial fibrillation (HCC)    CAD (coronary artery disease)    a. pre TAVR cath 2020 mild nonobstructive CAD in the LAD and CX with moderate nononstructive disease in the mid-distal RCA and PDA.   Cancer [facility])    left breast   Cataracts, bilateral    CVA (cerebral vascular accident) ([facility])    Depression    Diabetes mellitus    Dyspnea  with exertion   Full dentures    GERD (gastroesophageal reflux disease)    Groin hematoma    Hearing aid worn    B/L   History of TIA (transient ischemic attack)    mini stroke with vision problems   Hypertension    Middle cerebral artery stenosis, left    Mitral regurgitation    Pneumonia    PONV (postoperative nausea and vomiting)    Premature atrial contraction    S/P Maze operation for atrial fibrillation 10/11/2012   Complete bilateral atrial lesion set using bipolar radiofrequency and cryothermy ablation with  clipping of LA appendage   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   78m Sorin Memo 3D ring annuloplasty with 26 mm Edwards mc3 tricuspid ring annuloplasty    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   atorvastatin (LIPITOR) 40 MG tablet Take 40 mg by mouth at bedtime.    atorvastatin (LIPITOR) 80 MG tablet Take 1 tablet (80 mg total) by mouth daily.   Calcium Carb-Cholecalciferol (CALCIUM PLUS VITAMIN D3) 600-500 MG-UNIT CAPS Take 1 tablet by mouth 2 (two) times daily.   clopidogrel (PLAVIX) 75 MG tablet Take 1 tablet (75 mg total) by mouth daily.   Cyanocobalamin (VITAMIN B-12 PO) Take 1 tablet by mouth once a week.   dapagliflozin propanediol (FARXIGA) 10 MG TABS tablet daily.   insulin detemir (LEVEMIR) 100 UNIT/ML injection 50 Units daily.   magnesium hydroxide (MILK OF MAGNESIA) 400 MG/5ML suspension Take 30 mLs by mouth daily as needed for indigestion.    melatonin 3 MG TABS tablet as needed.   Multiple Vitamins-Minerals (MULTIVITAMIN WITH MINERALS) tablet Take 1 tablet by mouth daily.   nitroGLYCERIN (NITROSTAT) 0.4 MG SL tablet Place 1 tablet (0.4 mg total) under the tongue every 5 (five) minutes as needed for chest pain.   NOVOLOG 100 UNIT/ML injection Inject 15 Units into the skin every evening.   omeprazole (PRILOSEC) 40 MG capsule Take 40 mg by mouth daily before breakfast.    venlafaxine XR (EFFEXOR-XR) 150 MG 24 hr capsule Take 150 mg by mouth daily with breakfast. Take with 75 mg capsule to equal 225 mg daily   venlafaxine XR (EFFEXOR-XR) 75 MG 24 hr capsule Take 75 mg by mouth daily with breakfast. Take with 150 mg capsule to equal 225 mg daily    [DISCONTINUED] Rivaroxaban (XARELTO) 15 MG TABS tablet Take 15 mg by mouth daily with supper.     Allergies:   Buspirone hcl, Metformin and related, Ultram [tramadol], Nickel, and Pneumococcal vaccines   Social History   Tobacco Use   Smoking status: Never   Smokeless tobacco: Never  Vaping Use   Vaping Use: Never used  Substance Use Topics   Alcohol use: No   Drug use: No     Family Hx: The patient's family history includes Alcohol abuse in her mother; Cancer in her father; Heart attack in her paternal grandfather; Hypertension in her father. There is no history of Stroke.  ROS   EKGs/Labs/Other Test Reviewed:    EKG:  EKG is   ordered today.  The ekg ordered today demonstrates sinus  rhythm, heart rate 73, left axis deviation, septal Q waves, poor wave progression, nonspecific ST-T wave changes, QTC 440, no significant change when compared to prior tracing  Recent Labs: 03/03/2020: Creatinine, Ser 0.90   Recent Lipid Panel Lab Results  Component Value Date/Time   CHOL 112 01/23/2019 03:30 PM   TRIG 149 01/23/2019 03:30 PM   HDL 38 (L) 01/23/2019 03:30 PM   LDLCALC 44 01/23/2019 03:30 PM   Labs obtained through [facility] - personally reviewed and interpreted: 08/19/2020: Total cholesterol 213, HDL 69, LDL 111, triglycerides 194, A1c 9.4, Hgb 13.3, creatinine 1.08, K+ 5.2, magnesium 2.2, ALT 30  Creatinine clearance 41 mL/min  Risk Assessment/Calculations:    CHA2DS2-VASc Score = 8  This indicates a 10.8% annual risk of stroke. The patient's score is based upon: CHF History: No HTN History: Yes Diabetes History: Yes Stroke History: Yes Vascular Disease History: Yes Age Score: 2 Gender Score: 1     Physical Exam:    VS:  BP 112/68   Pulse 73   Ht 5' 6"  (1.676 m)   Wt 141 lb (64 kg)   SpO2 96%   BMI 22.76 kg/m     Wt Readings from Last 3 Encounters:  09/08/20 141 lb (64 kg)  01/30/20 136 lb (61.7 kg)  11/20/19 133 lb (60.3 kg)     Constitutional:       Appearance: Healthy appearance. Not in distress.  Pulmonary:     Effort: Pulmonary effort is normal.     Breath sounds: No wheezing. No rales.  Cardiovascular:     Normal rate. Regular rhythm. Normal S1. Normal S2.      Murmurs: There is a grade 1/6 systolic murmur at the URSB and ULSB.  Edema:    Peripheral edema absent.  Abdominal:     Palpations: Abdomen is soft.  Musculoskeletal:     Cervical back: Neck supple. Skin:    General: Skin is warm and dry.  Neurological:     Mental Status: Alert and oriented to person, place and time.     Cranial Nerves: Cranial nerves are intact.         ASSESSMENT & PLAN:    1. Coronary artery disease involving native coronary artery of native heart without angina pectoris 2. Precordial chest pain Nonobstructive disease by cardiac catheterization in 2020.  She notes occasional left-sided chest discomfort.  This is hard for her to describe.  It is not really changing.  It is not related to exertion.  Electrocardiogram today demonstrates no significant change.  I will give her a prescription for nitroglycerin to use as needed.  We discussed the possibility of proceeding with stress testing now versus watchful waiting.  She prefers watchful waiting.  If her symptoms should progress over time, she will contact me so that we can arrange stress testing.  3. Valvular heart disease 4. S/P mitral valve repair 5. History of tricuspid valve repair 6. S/P TAVR (transcatheter aortic valve replacement) Stable mitral valve and tricuspid valve repair as well as stable TAVR by echocardiogram 11/21.  Continue SBE prophylaxis.  7. Paroxysmal atrial fibrillation (HCC) She is tolerating anticoagulation.  Creatinine clearance is 41.  Continue rivaroxaban 15 mg daily.  Recent hemoglobin and creatinine stable.  8. Essential hypertension The patient's blood pressure is controlled on her current regimen.  Continue current therapy.    9. History of stroke She is on  antiplatelet therapy in addition to rivaroxaban.  She has not had recurrent strokes.  She  has been followed by interventional radiology.   10. Hyperlipidemia Goal LDL <70.  Increase atorvastatin to 80 mg daily.  Obtain c-Met, lipids and 3 months.     Dispo:  Return in about 6 months (around 03/10/2021) for Routine Follow Up w/ Dr. Angelena Form.   Medication Adjustments/Labs and Tests Ordered: Current medicines are reviewed at length with the patient today.  Concerns regarding medicines are outlined above.  Tests Ordered: Orders Placed This Encounter  Procedures   Comp Met (CMET)   Lipid Profile   EKG 12-Lead   Medication Changes: Meds ordered this encounter  Medications   nitroGLYCERIN (NITROSTAT) 0.4 MG SL tablet    Sig: Place 1 tablet (0.4 mg total) under the tongue every 5 (five) minutes as needed for chest pain.    Dispense:  25 tablet    Refill:  3   Rivaroxaban (XARELTO) 15 MG TABS tablet    Sig: Take 1 tablet (15 mg total) by mouth daily with supper.    Dispense:  30 tablet    Refill:  11   atorvastatin (LIPITOR) 80 MG tablet    Sig: Take 1 tablet (80 mg total) by mouth daily.    Dispense:  30 tablet    Refill:  [address], [place], [place]  09/08/2020 4:30 PM    [facility] [place], [place], [place]  [address] Phone: [phone]; Fax: [phone]

## 2020-09-08 NOTE — Patient Instructions (Signed)
Medication Instructions:  Your physician has recommended you make the following change in your medication:   START NITRO   If a single episode of chest pain is not relieved by one tablet, the patient will try another within 5 minutes; and if this doesn't relieve the pain, the patient will try another within 5 minutes and if this doesn't relieve the pain the patient is instructed to call 911 for transportation to an emergency department.   INCREASE LIPITOR one tablet by mouth ( 80 mg) daily.     *If you need a refill on your cardiac medications before your next appointment, please call your pharmacy*   Lab Work: Your physician recommends that you return for lab work in: on September 14 between 7:30 - 4:30 fasting from midnight the night before.   If you have labs (blood work) drawn today and your tests are completely normal, you will receive your results only by: [place] (if you have MyChart) OR A paper copy in the mail If you have any lab test that is abnormal or we need to change your treatment, we will call you to review the results.   Testing/Procedures: -None   Follow-Up: At [facility], you and your health needs are our priority.  As part of our continuing mission to provide you with exceptional heart care, we have created designated Provider Care Teams.  These Care Teams include your primary Cardiologist (physician) and Advanced Practice Providers (APPs -  Physician Assistants and Nurse Practitioners) who all work together to provide you with the care you need, when you need it.  We recommend signing up for the patient portal called "MyChart".  Sign up information is provided on this After Visit Summary.  MyChart is used to connect with patients for Virtual Visits (Telemedicine).  Patients are able to view lab/test results, encounter notes, upcoming appointments, etc.  Non-urgent messages can be sent to your provider as well.   To learn more about what you can do with  MyChart, go to [email or web address].    Your next appointment:   6 month(s)  The format for your next appointment:   In Person  Provider:   Lauree Chandler, MD   Other Instructions Your physician wants you to follow-up in: 6 months with Dr. Angelena Form.  You will receive a reminder letter in the mail two months in advance. If you don't receive a letter, please call our office to schedule the follow-up appointment.

## 2020-09-20 DIAGNOSIS — E1165 Type 2 diabetes mellitus with hyperglycemia: Secondary | ICD-10-CM | POA: Diagnosis not present

## 2020-09-24 DIAGNOSIS — K219 Gastro-esophageal reflux disease without esophagitis: Secondary | ICD-10-CM | POA: Diagnosis not present

## 2020-09-24 DIAGNOSIS — I129 Hypertensive chronic kidney disease with stage 1 through stage 4 chronic kidney disease, or unspecified chronic kidney disease: Secondary | ICD-10-CM | POA: Diagnosis not present

## 2020-09-24 DIAGNOSIS — E7800 Pure hypercholesterolemia, unspecified: Secondary | ICD-10-CM | POA: Diagnosis not present

## 2020-09-24 DIAGNOSIS — E1121 Type 2 diabetes mellitus with diabetic nephropathy: Secondary | ICD-10-CM | POA: Diagnosis not present

## 2020-09-24 DIAGNOSIS — C50912 Malignant neoplasm of unspecified site of left female breast: Secondary | ICD-10-CM | POA: Diagnosis not present

## 2020-09-24 DIAGNOSIS — I480 Paroxysmal atrial fibrillation: Secondary | ICD-10-CM | POA: Diagnosis not present

## 2020-09-24 DIAGNOSIS — N1831 Chronic kidney disease, stage 3a: Secondary | ICD-10-CM | POA: Diagnosis not present

## 2020-10-07 ENCOUNTER — Telehealth (HOSPITAL_COMMUNITY): Payer: Self-pay

## 2020-10-07 NOTE — Telephone Encounter (Signed)
Called to schedule f/u cta head/neck, no answer, left vm. AW 

## 2020-10-20 DIAGNOSIS — E1165 Type 2 diabetes mellitus with hyperglycemia: Secondary | ICD-10-CM | POA: Diagnosis not present

## 2020-10-29 ENCOUNTER — Other Ambulatory Visit (HOSPITAL_COMMUNITY): Payer: Self-pay | Admitting: Interventional Radiology

## 2020-10-29 DIAGNOSIS — I771 Stricture of artery: Secondary | ICD-10-CM

## 2020-10-30 DIAGNOSIS — C50912 Malignant neoplasm of unspecified site of left female breast: Secondary | ICD-10-CM | POA: Diagnosis not present

## 2020-10-30 DIAGNOSIS — N1831 Chronic kidney disease, stage 3a: Secondary | ICD-10-CM | POA: Diagnosis not present

## 2020-10-30 DIAGNOSIS — E7800 Pure hypercholesterolemia, unspecified: Secondary | ICD-10-CM | POA: Diagnosis not present

## 2020-10-30 DIAGNOSIS — K219 Gastro-esophageal reflux disease without esophagitis: Secondary | ICD-10-CM | POA: Diagnosis not present

## 2020-10-30 DIAGNOSIS — E1121 Type 2 diabetes mellitus with diabetic nephropathy: Secondary | ICD-10-CM | POA: Diagnosis not present

## 2020-10-30 DIAGNOSIS — I480 Paroxysmal atrial fibrillation: Secondary | ICD-10-CM | POA: Diagnosis not present

## 2020-10-30 DIAGNOSIS — I129 Hypertensive chronic kidney disease with stage 1 through stage 4 chronic kidney disease, or unspecified chronic kidney disease: Secondary | ICD-10-CM | POA: Diagnosis not present

## 2020-11-04 DIAGNOSIS — D6869 Other thrombophilia: Secondary | ICD-10-CM | POA: Diagnosis not present

## 2020-11-04 DIAGNOSIS — I129 Hypertensive chronic kidney disease with stage 1 through stage 4 chronic kidney disease, or unspecified chronic kidney disease: Secondary | ICD-10-CM | POA: Diagnosis not present

## 2020-11-04 DIAGNOSIS — R413 Other amnesia: Secondary | ICD-10-CM | POA: Diagnosis not present

## 2020-11-04 DIAGNOSIS — M67442 Ganglion, left hand: Secondary | ICD-10-CM | POA: Diagnosis not present

## 2020-11-04 DIAGNOSIS — I480 Paroxysmal atrial fibrillation: Secondary | ICD-10-CM | POA: Diagnosis not present

## 2020-11-04 DIAGNOSIS — N1831 Chronic kidney disease, stage 3a: Secondary | ICD-10-CM | POA: Diagnosis not present

## 2020-11-04 DIAGNOSIS — E1121 Type 2 diabetes mellitus with diabetic nephropathy: Secondary | ICD-10-CM | POA: Diagnosis not present

## 2020-11-04 DIAGNOSIS — K921 Melena: Secondary | ICD-10-CM | POA: Diagnosis not present

## 2020-11-10 ENCOUNTER — Emergency Department (HOSPITAL_COMMUNITY)
Admission: EM | Admit: 2020-11-10 | Discharge: 2020-11-10 | Disposition: A | Discharge status: Home / Self Care | Payer: Medicare Other | Attending: Emergency Medicine | Admitting: Emergency Medicine

## 2020-11-10 ENCOUNTER — Encounter (HOSPITAL_COMMUNITY): Payer: Self-pay | Admitting: Emergency Medicine

## 2020-11-10 ENCOUNTER — Emergency Department (HOSPITAL_COMMUNITY): Payer: Medicare Other

## 2020-11-10 DIAGNOSIS — G319 Degenerative disease of nervous system, unspecified: Secondary | ICD-10-CM | POA: Diagnosis not present

## 2020-11-10 DIAGNOSIS — W19XXXA Unspecified fall, initial encounter: Secondary | ICD-10-CM | POA: Diagnosis not present

## 2020-11-10 DIAGNOSIS — W01198A Fall on same level from slipping, tripping and stumbling with subsequent striking against other object, initial encounter: Secondary | ICD-10-CM | POA: Diagnosis not present

## 2020-11-10 DIAGNOSIS — R42 Dizziness and giddiness: Secondary | ICD-10-CM | POA: Insufficient documentation

## 2020-11-10 DIAGNOSIS — I6529 Occlusion and stenosis of unspecified carotid artery: Secondary | ICD-10-CM | POA: Diagnosis not present

## 2020-11-10 DIAGNOSIS — R739 Hyperglycemia, unspecified: Secondary | ICD-10-CM | POA: Diagnosis not present

## 2020-11-10 DIAGNOSIS — R6889 Other general symptoms and signs: Secondary | ICD-10-CM | POA: Diagnosis not present

## 2020-11-10 DIAGNOSIS — S0990XA Unspecified injury of head, initial encounter: Secondary | ICD-10-CM | POA: Diagnosis not present

## 2020-11-10 DIAGNOSIS — Z743 Need for continuous supervision: Secondary | ICD-10-CM | POA: Diagnosis not present

## 2020-11-10 NOTE — ED Triage Notes (Signed)
Pt BIB [facility] from home, reports a fall at 1500 today, hitting the left side of her head. GCS 15, ambulatory at baseline with a cane. Pt has no complaints at this time.

## 2020-11-10 NOTE — Discharge Instructions (Addendum)
Your CT was without bleeding on the inside of the skull.  Follow up with your family doc.  Whenever you fall from home its a good idea for them to see you to eval if you need PT or your meds adjusted.

## 2020-11-10 NOTE — Progress Notes (Signed)
Orthopedic Tech Progress Note Patient Details:  Rachel Santos 10/30/1940 [id number] Level 2 trauma Patient ID: Rachel Santos, female   DOB: [birth year], 80 y.o.   MRN: [id number]  Rachel Santos 11/10/2020, 11:04 PM

## 2020-11-10 NOTE — ED Provider Notes (Signed)
[facility] EMERGENCY DEPARTMENT Provider Note   CSN: [id number] Arrival date & time: 11/10/20  2255     History Chief Complaint  Patient presents with   Rachel Santos is a 80 y.o. female.  80 yo F with a cc of a fall.  Patient stood up to fast and fell.  Patient states that sometimes when she stands up too quickly she feels lightheaded tells me that this happened today about 330 and she ended up falling and struck a side table.  She denies any significant injury.  Denies confusion denies vomiting.  Denies pain to the neck the back the chest abdomen and the extremities.  She actually does not have any pain currently.  She told her family and they strongly encouraged her to come and be evaluated.  She is on Xarelto and Plavix.  The history is provided by the patient.  Fall This is a new problem. The current episode started 6 to 12 hours ago. The problem occurs constantly. The problem has not changed since onset.Pertinent negatives include no chest pain, no headaches and no shortness of breath. Nothing aggravates the symptoms. Nothing relieves the symptoms. She has tried nothing for the symptoms. The treatment provided no relief.      History reviewed. No pertinent past medical history.  There are no problems to display for this patient.   History reviewed. No pertinent surgical history.   OB History   No obstetric history on file.     No family history on file.     Home Medications Prior to Admission medications   Not on File    Allergies    Patient has no allergy information on record.  Review of Systems   Review of Systems  Constitutional:  Negative for chills and fever.  HENT:  Negative for congestion and rhinorrhea.   Eyes:  Negative for redness and visual disturbance.  Respiratory:  Negative for shortness of breath and wheezing.   Cardiovascular:  Negative for chest pain and palpitations.  Gastrointestinal:  Negative for nausea  and vomiting.  Genitourinary:  Negative for dysuria and urgency.  Musculoskeletal:  Negative for arthralgias and myalgias.  Skin:  Negative for pallor and wound.  Neurological:  Negative for dizziness and headaches.   Physical Exam Updated Vital Signs BP 120/60   Pulse 68   Temp 97.7 F (36.5 C) (Temporal)   Resp 16   Ht '5\' 6"'$  (1.676 m)   Wt 65.8 kg   SpO2 100%   BMI 23.40 kg/m   Physical Exam Vitals and nursing note reviewed.  Constitutional:      General: She is not in acute distress.    Appearance: She is well-developed. She is not diaphoretic.  HENT:     Head: Normocephalic and atraumatic.  Eyes:     Pupils: Pupils are equal, round, and reactive to light.  Cardiovascular:     Rate and Rhythm: Normal rate and regular rhythm.     Heart sounds: No murmur heard.   No friction rub. No gallop.  Pulmonary:     Effort: Pulmonary effort is normal.     Breath sounds: No wheezing or rales.  Abdominal:     General: There is no distension.     Palpations: Abdomen is soft.     Tenderness: There is no abdominal tenderness.  Musculoskeletal:        General: No tenderness.     Cervical back: Normal range of motion and  neck supple.     Comments: No signs of trauma.  Palpated from head to toe without any bony tenderness.  Able to range her neck 45 degrees in either direction without pain.  No midline step-offs or deformities of the CT or L-spine.  Skin:    General: Skin is warm and dry.  Neurological:     Mental Status: She is alert and oriented to person, place, and time.  Psychiatric:        Behavior: Behavior normal.    ED Results / Procedures / Treatments   Labs (all labs ordered are listed, but only abnormal results are displayed) Labs Reviewed - No data to display  EKG None  Radiology CT HEAD WO CONTRAST (5MM)  Result Date: 11/10/2020 CLINICAL DATA:  Head trauma EXAM: CT HEAD WITHOUT CONTRAST TECHNIQUE: Contiguous axial images were obtained from the base of the  skull through the vertex without intravenous contrast. COMPARISON:  CT brain 03/03/2020 FINDINGS: Brain: No acute territorial infarction, hemorrhage or intracranial mass. Moderate atrophy. Nonenlarged ventricles. Vascular: No hyperdense vessels.  Carotid vascular calcification Skull: Normal. Negative for fracture or focal lesion. Sinuses/Orbits: No acute finding. Other: None IMPRESSION: 1. No CT evidence for acute intracranial abnormality. 2. Atrophy and minimal chronic small vessel ischemic change of the white matter Electronically Signed   By: Donavan Foil M.D.   On: 11/10/2020 23:29    Procedures Procedures   Medications Ordered in ED Medications - No data to display  ED Course  I have reviewed the triage vital signs and the nursing notes.  Pertinent labs & imaging results that were available during my care of the patient were reviewed by me and considered in my medical decision making (see chart for details).    MDM Rules/Calculators/A&P                           80 yo F with a chief complaint of a fall.  Nonsyncopal by history.  Patient has no complaints.  Told her family that she fell earlier today and they made her come to be evaluated.  Discussed CT imaging with the patient and she is willing.  CT negative, d/c home.  11:35 PM:  I have discussed the diagnosis/risks/treatment options with the patient and believe the pt to be eligible for discharge home to follow-up with PCP. We also discussed returning to the ED immediately if new or worsening sx occur. We discussed the sx which are most concerning (e.g., sudden worsening pain, fever, inability to tolerate by mouth) that necessitate immediate return. Medications administered to the patient during their visit and any new prescriptions provided to the patient are listed below.  Medications given during this visit Medications - No data to display   The patient appears reasonably screen and/or stabilized for discharge and I doubt any  other medical condition or other [facility] requiring further screening, evaluation, or treatment in the ED at this time prior to discharge.   Final Clinical Impression(s) / ED Diagnoses Final diagnoses:  Fall, initial encounter    Rx / DC Orders ED Discharge Orders     None        Deno Etienne, DO 11/10/20 2335

## 2020-11-11 ENCOUNTER — Encounter: Payer: Self-pay | Admitting: Physician Assistant

## 2020-11-27 DIAGNOSIS — E1165 Type 2 diabetes mellitus with hyperglycemia: Secondary | ICD-10-CM | POA: Diagnosis not present

## 2020-12-09 ENCOUNTER — Other Ambulatory Visit: Payer: Medicare Other

## 2020-12-15 ENCOUNTER — Telehealth (HOSPITAL_COMMUNITY): Payer: Self-pay

## 2020-12-15 NOTE — Telephone Encounter (Signed)
Spoke to daughter, will call back after she confirms dates to schedule cta head/neck. AW

## 2020-12-18 ENCOUNTER — Other Ambulatory Visit: Payer: Medicare Other

## 2020-12-18 DIAGNOSIS — I480 Paroxysmal atrial fibrillation: Secondary | ICD-10-CM | POA: Diagnosis not present

## 2020-12-18 DIAGNOSIS — D6869 Other thrombophilia: Secondary | ICD-10-CM | POA: Diagnosis not present

## 2020-12-18 DIAGNOSIS — E1121 Type 2 diabetes mellitus with diabetic nephropathy: Secondary | ICD-10-CM | POA: Diagnosis not present

## 2020-12-18 DIAGNOSIS — R413 Other amnesia: Secondary | ICD-10-CM | POA: Diagnosis not present

## 2020-12-18 DIAGNOSIS — Z23 Encounter for immunization: Secondary | ICD-10-CM | POA: Diagnosis not present

## 2020-12-18 DIAGNOSIS — I129 Hypertensive chronic kidney disease with stage 1 through stage 4 chronic kidney disease, or unspecified chronic kidney disease: Secondary | ICD-10-CM | POA: Diagnosis not present

## 2020-12-18 DIAGNOSIS — N1831 Chronic kidney disease, stage 3a: Secondary | ICD-10-CM | POA: Diagnosis not present

## 2020-12-18 DIAGNOSIS — Z79899 Other long term (current) drug therapy: Secondary | ICD-10-CM | POA: Diagnosis not present

## 2020-12-21 ENCOUNTER — Other Ambulatory Visit: Payer: Self-pay | Admitting: Gastroenterology

## 2020-12-21 DIAGNOSIS — Z86010 Personal history of colonic polyps: Secondary | ICD-10-CM | POA: Diagnosis not present

## 2020-12-21 DIAGNOSIS — R195 Other fecal abnormalities: Secondary | ICD-10-CM | POA: Diagnosis not present

## 2020-12-21 DIAGNOSIS — K625 Hemorrhage of anus and rectum: Secondary | ICD-10-CM | POA: Diagnosis not present

## 2020-12-22 ENCOUNTER — Telehealth: Payer: Self-pay

## 2020-12-22 NOTE — Telephone Encounter (Signed)
   [facility] Pre-operative Risk Assessment    Patient Name: Rachel Santos  DOB: [birth year] MRN: [id number]  HEARTCARE STAFF:  - IMPORTANT!!!!!! Under Visit Info/Reason for Call, type in Other and utilize the format Clearance MM/DD/YY or Clearance TBD. Do not use dashes or single digits. - Please review there is not already an duplicate clearance open for this procedure. - If request is for dental extraction, please clarify the # of teeth to be extracted. - If the patient is currently at the dentist's office, call Pre-Op Callback Staff (MA/nurse) to input urgent request.  - If the patient is not currently in the dentist office, please route to the Pre-Op pool.  Request for surgical clearance:  What type of surgery is being performed? COLONOSCOPY  When is this surgery scheduled? 01/22/21  What type of clearance is required (medical clearance vs. Pharmacy clearance to hold med vs. Both)? PHARMACY  Are there any medications that need to be held prior to surgery and how long? [place] INSTRUCTIONS WHEN TO HOLD  Practice name and name of physician performing surgery? [facility]; DR HUNG  What is the office phone number? [phone]   7.   What is the office fax number? [phone]  8.   Anesthesia type (None, local, MAC, general) ? PROPOFOL   Jacinta Shoe 12/22/2020, 2:57 PM  _________________________________________________________________   (provider comments below)

## 2020-12-24 DIAGNOSIS — I129 Hypertensive chronic kidney disease with stage 1 through stage 4 chronic kidney disease, or unspecified chronic kidney disease: Secondary | ICD-10-CM | POA: Diagnosis not present

## 2020-12-24 DIAGNOSIS — K219 Gastro-esophageal reflux disease without esophagitis: Secondary | ICD-10-CM | POA: Diagnosis not present

## 2020-12-24 DIAGNOSIS — I480 Paroxysmal atrial fibrillation: Secondary | ICD-10-CM | POA: Diagnosis not present

## 2020-12-24 DIAGNOSIS — E7800 Pure hypercholesterolemia, unspecified: Secondary | ICD-10-CM | POA: Diagnosis not present

## 2020-12-24 DIAGNOSIS — N1831 Chronic kidney disease, stage 3a: Secondary | ICD-10-CM | POA: Diagnosis not present

## 2020-12-24 DIAGNOSIS — E1121 Type 2 diabetes mellitus with diabetic nephropathy: Secondary | ICD-10-CM | POA: Diagnosis not present

## 2020-12-24 NOTE — Telephone Encounter (Signed)
Patient with diagnosis of afib on Xarelto for anticoagulation.    Procedure: COLONOSCOPY Date of procedure: 01/22/21  CHA2DS2-VASc Score = 8   This indicates a 10.8% annual risk of stroke. The patient's score is based upon: CHF History: 0 HTN History: 1 Diabetes History: 1 Stroke History: 2 Vascular Disease History: 1 Age Score: 2 Gender Score: 1      CrCl 47 ml/min  Per office protocol, patient can hold Xarelto for 1 day prior to procedure.

## 2020-12-27 DIAGNOSIS — E1165 Type 2 diabetes mellitus with hyperglycemia: Secondary | ICD-10-CM | POA: Diagnosis not present

## 2021-01-08 DIAGNOSIS — Z79899 Other long term (current) drug therapy: Secondary | ICD-10-CM | POA: Diagnosis not present

## 2021-01-08 DIAGNOSIS — I129 Hypertensive chronic kidney disease with stage 1 through stage 4 chronic kidney disease, or unspecified chronic kidney disease: Secondary | ICD-10-CM | POA: Diagnosis not present

## 2021-01-08 DIAGNOSIS — E7800 Pure hypercholesterolemia, unspecified: Secondary | ICD-10-CM | POA: Diagnosis not present

## 2021-01-08 DIAGNOSIS — I480 Paroxysmal atrial fibrillation: Secondary | ICD-10-CM | POA: Diagnosis not present

## 2021-01-08 DIAGNOSIS — N1831 Chronic kidney disease, stage 3a: Secondary | ICD-10-CM | POA: Diagnosis not present

## 2021-01-08 DIAGNOSIS — G3184 Mild cognitive impairment, so stated: Secondary | ICD-10-CM | POA: Diagnosis not present

## 2021-01-08 DIAGNOSIS — E1121 Type 2 diabetes mellitus with diabetic nephropathy: Secondary | ICD-10-CM | POA: Diagnosis not present

## 2021-01-08 DIAGNOSIS — D6869 Other thrombophilia: Secondary | ICD-10-CM | POA: Diagnosis not present

## 2021-01-13 ENCOUNTER — Ambulatory Visit (HOSPITAL_COMMUNITY)
Admission: RE | Admit: 2021-01-13 | Discharge: 2021-01-13 | Disposition: A | Discharge status: Home / Self Care | Payer: Medicare Other | Source: Ambulatory Visit | Attending: Interventional Radiology | Admitting: Interventional Radiology

## 2021-01-13 ENCOUNTER — Encounter (HOSPITAL_COMMUNITY): Payer: Self-pay | Admitting: Gastroenterology

## 2021-01-13 DIAGNOSIS — I6602 Occlusion and stenosis of left middle cerebral artery: Secondary | ICD-10-CM | POA: Diagnosis not present

## 2021-01-13 DIAGNOSIS — I639 Cerebral infarction, unspecified: Secondary | ICD-10-CM | POA: Diagnosis not present

## 2021-01-13 DIAGNOSIS — I6503 Occlusion and stenosis of bilateral vertebral arteries: Secondary | ICD-10-CM | POA: Diagnosis not present

## 2021-01-13 DIAGNOSIS — I651 Occlusion and stenosis of basilar artery: Secondary | ICD-10-CM | POA: Diagnosis not present

## 2021-01-13 DIAGNOSIS — I771 Stricture of artery: Secondary | ICD-10-CM | POA: Diagnosis not present

## 2021-01-13 DIAGNOSIS — I6622 Occlusion and stenosis of left posterior cerebral artery: Secondary | ICD-10-CM | POA: Diagnosis not present

## 2021-01-13 LAB — POCT I-STAT CREATININE: Creatinine, Ser: 0.9 mg/dL (ref 0.44–1.00)

## 2021-01-13 MED ORDER — IOHEXOL 350 MG/ML SOLN
80.0000 mL | Freq: Once | INTRAVENOUS | Status: AC | PRN
  Administered 2021-01-13: 80 mL via INTRAVENOUS

## 2021-01-18 ENCOUNTER — Telehealth (HOSPITAL_COMMUNITY): Payer: Self-pay

## 2021-01-18 NOTE — Telephone Encounter (Signed)
Pt's daughter agreed to f/u in 6 months with cta head/neck. AW

## 2021-01-19 DIAGNOSIS — E1121 Type 2 diabetes mellitus with diabetic nephropathy: Secondary | ICD-10-CM | POA: Diagnosis not present

## 2021-01-19 DIAGNOSIS — N1831 Chronic kidney disease, stage 3a: Secondary | ICD-10-CM | POA: Diagnosis not present

## 2021-01-19 DIAGNOSIS — I480 Paroxysmal atrial fibrillation: Secondary | ICD-10-CM | POA: Diagnosis not present

## 2021-01-19 DIAGNOSIS — I129 Hypertensive chronic kidney disease with stage 1 through stage 4 chronic kidney disease, or unspecified chronic kidney disease: Secondary | ICD-10-CM | POA: Diagnosis not present

## 2021-01-19 DIAGNOSIS — E7800 Pure hypercholesterolemia, unspecified: Secondary | ICD-10-CM | POA: Diagnosis not present

## 2021-01-19 DIAGNOSIS — K219 Gastro-esophageal reflux disease without esophagitis: Secondary | ICD-10-CM | POA: Diagnosis not present

## 2021-01-22 ENCOUNTER — Ambulatory Visit (HOSPITAL_COMMUNITY): Payer: Medicare Other | Admitting: Registered Nurse

## 2021-01-22 ENCOUNTER — Encounter (HOSPITAL_COMMUNITY): Payer: Self-pay | Admitting: Gastroenterology

## 2021-01-22 ENCOUNTER — Other Ambulatory Visit: Payer: Self-pay

## 2021-01-22 ENCOUNTER — Encounter (HOSPITAL_COMMUNITY)
Admission: RE | Disposition: A | Discharge status: Home / Self Care | Payer: Self-pay | Source: Home / Self Care | Attending: Gastroenterology

## 2021-01-22 ENCOUNTER — Ambulatory Visit (HOSPITAL_COMMUNITY)
Admission: RE | Admit: 2021-01-22 | Discharge: 2021-01-22 | Disposition: A | Discharge status: Home / Self Care | Payer: Medicare Other | Attending: Gastroenterology | Admitting: Gastroenterology

## 2021-01-22 DIAGNOSIS — R195 Other fecal abnormalities: Secondary | ICD-10-CM | POA: Diagnosis not present

## 2021-01-22 DIAGNOSIS — I480 Paroxysmal atrial fibrillation: Secondary | ICD-10-CM | POA: Diagnosis not present

## 2021-01-22 DIAGNOSIS — K5730 Diverticulosis of large intestine without perforation or abscess without bleeding: Secondary | ICD-10-CM | POA: Diagnosis not present

## 2021-01-22 DIAGNOSIS — Z888 Allergy status to other drugs, medicaments and biological substances status: Secondary | ICD-10-CM | POA: Insufficient documentation

## 2021-01-22 DIAGNOSIS — Z8673 Personal history of transient ischemic attack (TIA), and cerebral infarction without residual deficits: Secondary | ICD-10-CM | POA: Insufficient documentation

## 2021-01-22 DIAGNOSIS — K921 Melena: Secondary | ICD-10-CM | POA: Diagnosis not present

## 2021-01-22 DIAGNOSIS — Z885 Allergy status to narcotic agent status: Secondary | ICD-10-CM | POA: Diagnosis not present

## 2021-01-22 DIAGNOSIS — Z952 Presence of prosthetic heart valve: Secondary | ICD-10-CM | POA: Insufficient documentation

## 2021-01-22 DIAGNOSIS — Z794 Long term (current) use of insulin: Secondary | ICD-10-CM | POA: Insufficient documentation

## 2021-01-22 DIAGNOSIS — I10 Essential (primary) hypertension: Secondary | ICD-10-CM | POA: Diagnosis not present

## 2021-01-22 DIAGNOSIS — Z853 Personal history of malignant neoplasm of breast: Secondary | ICD-10-CM | POA: Diagnosis not present

## 2021-01-22 DIAGNOSIS — Z887 Allergy status to serum and vaccine status: Secondary | ICD-10-CM | POA: Diagnosis not present

## 2021-01-22 DIAGNOSIS — Q438 Other specified congenital malformations of intestine: Secondary | ICD-10-CM | POA: Insufficient documentation

## 2021-01-22 DIAGNOSIS — D62 Acute posthemorrhagic anemia: Secondary | ICD-10-CM | POA: Diagnosis not present

## 2021-01-22 HISTORY — PX: COLONOSCOPY WITH PROPOFOL: SHX5780

## 2021-01-22 SURGERY — COLONOSCOPY WITH PROPOFOL
Anesthesia: Monitor Anesthesia Care

## 2021-01-22 MED ORDER — PHENYLEPHRINE HCL (PRESSORS) 10 MG/ML IV SOLN
INTRAVENOUS | Status: DC | PRN
  Administered 2021-01-22 (×2): 80 ug via INTRAVENOUS

## 2021-01-22 MED ORDER — LACTATED RINGERS IV SOLN
INTRAVENOUS | Status: DC
  Administered 2021-01-22: 1000 mL via INTRAVENOUS

## 2021-01-22 MED ORDER — ONDANSETRON HCL 4 MG/2ML IJ SOLN
INTRAMUSCULAR | Status: DC | PRN
  Administered 2021-01-22: 4 mg via INTRAVENOUS

## 2021-01-22 MED ORDER — SODIUM CHLORIDE 0.9 % IV SOLN: INTRAVENOUS | Status: DC

## 2021-01-22 MED ORDER — PROPOFOL 10 MG/ML IV BOLUS
INTRAVENOUS | Status: DC | PRN
  Administered 2021-01-22: 250 ug/kg/min via INTRAVENOUS

## 2021-01-22 MED ORDER — LIDOCAINE HCL (CARDIAC) PF 100 MG/5ML IV SOSY
PREFILLED_SYRINGE | INTRAVENOUS | Status: DC | PRN
  Administered 2021-01-22: 50 mg via INTRAVENOUS

## 2021-01-22 SURGICAL SUPPLY — 22 items

## 2021-01-22 NOTE — Discharge Instructions (Signed)

## 2021-01-22 NOTE — Transfer of Care (Signed)
Immediate Anesthesia Transfer of Care Note  Patient: Rachel Santos  Procedure(s) Performed: COLONOSCOPY WITH PROPOFOL  Patient Location: PACU  Anesthesia Type:MAC  Level of Consciousness: awake, alert  and patient cooperative  Airway & Oxygen Therapy: Patient Spontanous Breathing and Patient connected to face mask oxygen  Post-op Assessment: Report given to RN, Post -op Vital signs reviewed and stable and Patient moving all extremities X 4  Post vital signs: stable  Last Vitals:  Vitals Value Taken Time  BP 152/32 01/22/21 1043  Temp    Pulse 51 01/22/21 1046  Resp 19 01/22/21 1046  SpO2 100 % 01/22/21 1046  Vitals shown include unvalidated device data.  Last Pain:  Vitals:   01/22/21 1043  TempSrc:   PainSc: 0-No pain         Complications: No notable events documented.

## 2021-01-22 NOTE — Op Note (Signed)
[facility] Patient Name: Rachel Santos Procedure Date: 01/22/2021 MRN: [id number] Attending MD: Carol Ada , MD Date of Birth: December 28, 1940 CSN: [id number] Age: 80 Admit Type: Outpatient Procedure:                Colonoscopy Indications:              Heme positive stool Providers:                Carol Ada, MD, Jaci Carrel, RN, Tyna Jaksch Technician Referring MD:              Medicines:                 Complications:            No immediate complications. Estimated Blood Loss:     Estimated blood loss: none. Procedure:                Pre-Anesthesia Assessment:                           - Prior to the procedure, a History and Physical                            was performed, and patient medications and                            allergies were reviewed. The patient's tolerance of                            previous anesthesia was also reviewed. The risks                            and benefits of the procedure and the sedation                            options and risks were discussed with the patient.                            All questions were answered, and informed consent                            was obtained. Prior Anticoagulants: The patient has                            taken no previous anticoagulant or antiplatelet                            agents. ASA Grade Assessment: III - A patient with                            severe systemic disease. After reviewing the risks                            and benefits, the patient  was deemed in                            satisfactory condition to undergo the procedure.                           - Sedation was administered by an anesthesia                            professional. Deep sedation was attained.                           After obtaining informed consent, the colonoscope                            was passed under direct vision. Throughout the                             procedure, the patient's blood pressure, pulse, and                            oxygen saturations were monitored continuously. The                            CF-HQ190L ([id number]) Olympus colonoscope was                            introduced through the anus and advanced to the the                            cecum, identified by appendiceal orifice and                            ileocecal valve. The colonoscopy was somewhat                            difficult due to significant looping. Successful                            completion of the procedure was aided by                            straightening and shortening the scope to obtain                            bowel loop reduction. The patient tolerated the                            procedure well. The quality of the bowel                            preparation was evaluated using the BBPS [facility]                            Bowel  Preparation Scale) with scores of: Right                            Colon = 1 (portion of mucosa seen, but other areas                            not well seen due to staining, residual stool                            and/or opaque liquid), Transverse Colon = 2 (minor                            amount of residual staining, small fragments of                            stool and/or opaque liquid, but mucosa seen well)                            and Left Colon = 2 (minor amount of residual                            staining, small fragments of stool and/or opaque                            liquid, but mucosa seen well). The total BBPS score                            equals 5. The quality of the bowel preparation was                            good. The ileocecal valve, appendiceal orifice, and                            rectum were photographed. Scope In: 10:12:46 AM Scope Out: 10:34:47 AM Scope Withdrawal Time: 0 hours 13 minutes 40 seconds  Total Procedure Duration: 0 hours 22 minutes 1 second   Findings:      Scattered small and large-mouthed diverticula were found in the sigmoid       colon.      With extensive lavage the prep was improved to allow for good to       excellent views of the mucosa. A retroflexion in the rectum was not       possible as her rectum was smaller in caliber. Impression:               - Diverticulosis in the sigmoid colon.                           - No specimens collected. Moderate Sedation:      Not Applicable - Patient had care per Anesthesia. Recommendation:           - Patient has a contact number available for  emergencies. The signs and symptoms of potential                            delayed complications were discussed with the                            patient. Return to normal activities tomorrow.                            Written discharge instructions were provided to the                            patient.                           - Resume previous diet.                           - Continue present medications.                           - Repeat colonoscopy is not recommended for                            surveillance. Procedure Code(s):        --- Professional ---                           [phone], Colonoscopy, flexible; diagnostic, including                            collection of specimen(s) by brushing or washing,                            when performed (separate procedure) Diagnosis Code(s):        --- Professional ---                           R19.5, Other fecal abnormalities                           K57.30, Diverticulosis of large intestine without                            perforation or abscess without bleeding CPT copyright 2019 American Medical Association. All rights reserved. The codes documented in this report are preliminary and upon coder review may  be revised to meet current compliance requirements. Carol Ada, MD Carol Ada, MD 01/22/2021 10:40:33 AM This report has been  signed electronically. Number of Addenda: 0

## 2021-01-22 NOTE — H&P (Signed)
Rachel Santos HPI: Her colonoscopy on 11/28/2014 was positive for a small TA and diverticula.  The procedure was performed for a positive Cologuard.  In August this year she had some hematochezia.  Dr. Felipa Eth examined the patient and she was noted to be hemoccult positive.  She takes both Plavix and Xarelto.  Her HGB with Dr. Felipa Eth was in the 13 g/dL range, i.e., normal.  Past Medical History:  Diagnosis Date   Anxiety    Arthritis    knees   Atrial fibrillation (HCC)    CAD (coronary artery disease)    a. pre TAVR cath 2020 mild nonobstructive CAD in the LAD and CX with moderate nononstructive disease in the mid-distal RCA and PDA.   Cancer ([place])    left breast   Cataracts, bilateral    CVA (cerebral vascular accident) ([place])    Depression    Diabetes mellitus    Dyspnea    with exertion   Full dentures    GERD (gastroesophageal reflux disease)    Groin hematoma    Hearing aid worn    B/L   History of TIA (transient ischemic attack)    mini stroke with vision problems   Hypertension    Middle cerebral artery stenosis, left    Mitral regurgitation    Pneumonia    PONV (postoperative nausea and vomiting)    Premature atrial contraction    S/P Maze operation for atrial fibrillation 10/11/2012   Complete bilateral atrial lesion set using bipolar radiofrequency and cryothermy ablation with clipping of LA appendage   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   42mm Sorin Memo 3D ring annuloplasty with 26 mm Edwards mc3 tricuspid ring annuloplasty    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   s/p TAVR with a 23 Edwards Sapien 3 Ultra THV via the TF approach   Thyroid nodule    Tricuspid regurgitation    Wears glasses     Past Surgical History:  Procedure Laterality Date   ARTERY REPAIR Right 01/22/2019   Procedure: Right Common Femoral Artery Repair;  Surgeon: Angelia Mould, MD;  Location: [place];  Service: Vascular;  Laterality: Right;   CARDIAC  CATHETERIZATION  09-05-12   CARDIOVERSION N/A 07/31/2012   Procedure: CARDIOVERSION;  Surgeon: Josue Hector, MD;  Location: [place];  Service: Cardiovascular;  Laterality: N/A;   CATARACT EXTRACTION W/ INTRAOCULAR LENS  IMPLANT, BILATERAL     CESAREAN SECTION     x 2   COLONOSCOPY WITH PROPOFOL N/A 11/28/2014   Procedure: COLONOSCOPY WITH PROPOFOL;  Surgeon: Carol Ada, MD;  Location: [facility];  Service: Endoscopy;  Laterality: N/A;   DILATION AND CURETTAGE OF UTERUS     FEMORAL ARTERY EXPLORATION Right 01/22/2019   Procedure: RIGHT GROIN EXPLORATION with evacuation of hematoma;  Surgeon: Angelia Mould, MD;  Location: [place];  Service: Vascular;  Laterality: Right;   FLEXIBLE SIGMOIDOSCOPY N/A 12/18/2015   Procedure: FLEXIBLE SIGMOIDOSCOPY;  Surgeon: Carol Ada, MD;  Location: [facility];  Service: Endoscopy;  Laterality: N/A;   INTRAOPERATIVE TRANSESOPHAGEAL ECHOCARDIOGRAM N/A 10/11/2012   Procedure: INTRAOPERATIVE TRANSESOPHAGEAL ECHOCARDIOGRAM;  Surgeon: Rexene Alberts, MD;  Location: [place];  Service: Open Heart Surgery;  Laterality: N/A;   IR ANGIO INTRA EXTRACRAN SEL COM CAROTID INNOMINATE BILAT MOD SED  05/21/2019   IR ANGIO VERTEBRAL SEL VERTEBRAL BILAT MOD SED  05/21/2019   MASTECTOMY Left 1982   MAZE N/A 10/11/2012   Procedure: MAZE;  Surgeon: Rexene Alberts, MD;  Location: [facility];  Service: Open Heart Surgery;  Laterality: N/A;   MITRAL VALVE REPAIR N/A 10/11/2012   Procedure: MITRAL VALVE REPAIR (MVR);  Surgeon: Rexene Alberts, MD;  Location: [place];  Service: Open Heart Surgery;  Laterality: N/A;   MULTIPLE TOOTH EXTRACTIONS     RADIOLOGY WITH ANESTHESIA N/A 06/19/2019   Procedure: STENTING;  Surgeon: Luanne Bras, MD;  Location: [place];  Service: Radiology;  Laterality: N/A;   RIGHT HEART CATH AND CORONARY ANGIOGRAPHY N/A 12/24/2018   Procedure: RIGHT HEART CATH AND CORONARY ANGIOGRAPHY;  Surgeon: [name], MD;  Location: [place] [facility];   Service: Cardiovascular;  Laterality: N/A;   TEE WITHOUT CARDIOVERSION N/A 07/31/2012   Procedure: TRANSESOPHAGEAL ECHOCARDIOGRAM (TEE);  Surgeon: Josue Hector, MD;  Location: [place];  Service: Cardiovascular;  Laterality: N/A;   TEE WITHOUT CARDIOVERSION N/A 01/22/2019   Procedure: TRANSESOPHAGEAL ECHOCARDIOGRAM (TEE);  Surgeon: [name], MD;  Location: [place] [facility];  Service: Open Heart Surgery;  Laterality: N/A;   TRANSCATHETER AORTIC VALVE REPLACEMENT, TRANSFEMORAL N/A 01/22/2019   Procedure: TRANSCATHETER AORTIC VALVE REPLACEMENT, TRANSFEMORAL;  Surgeon: [name], MD;  Location: [place] [facility];  Service: Open Heart Surgery;  Laterality: N/A;   TRICUSPID VALVE REPLACEMENT N/A 10/11/2012   Procedure: TRICUSPID VALVE REPAIR;  Surgeon: Rexene Alberts, MD;  Location: [place];  Service: Open Heart Surgery;  Laterality: N/A;   TUBAL LIGATION      Family History  Problem Relation Age of Onset   Alcohol abuse Mother    Cancer Father        prostate   Hypertension Father    Heart attack Paternal Grandfather    Stroke Neg Hx     Social History:  reports that she has an unknown smoking status. She has never used smokeless tobacco. She reports that she does not drink alcohol and does not use drugs.  Allergies:  Allergies  Allergen Reactions   Buspirone Hcl     lethargic/tired   Metformin And Related Diarrhea   Ultram [Tramadol] Nausea And Vomiting   Nickel Rash    Pt unable to wear jewelry made of nickel.   Pneumococcal Vaccines Swelling and Rash    At injection set    Medications: Scheduled: Continuous:  sodium chloride     lactated ringers      No results found for this or any previous visit (from the past 24 hour(s)).   No results found.  ROS:  As stated above in the HPI otherwise negative.  There were no vitals taken for this visit.    PE: Gen: NAD, Alert and Oriented HEENT:  [place]/AT, EOMI Neck: Supple, no LAD Lungs: CTA  Bilaterally CV: RRR without M/G/R ABD: Soft, NTND, +BS Ext: No C/C/E  Assessment/Plan: 1) Heme positive - colonoscopy.  Lynita Groseclose D 01/22/2021, 9:44 AM

## 2021-01-22 NOTE — Anesthesia Preprocedure Evaluation (Addendum)
Anesthesia Evaluation  Patient identified by MRN, date of birth, ID band Patient awake    Reviewed: Allergy & Precautions, NPO status , Patient's Chart, lab work & pertinent test results  History of Anesthesia Complications (+) PONV and history of anesthetic complications  Airway Mallampati: II  TM Distance: >3 FB Neck ROM: Full    Dental  (+) Edentulous Upper, Edentulous Lower, Dental Advisory Given   Pulmonary shortness of breath and with exertion, pneumonia,    breath sounds clear to auscultation       Cardiovascular hypertension, Pt. on medications + CAD  + Valvular Problems/Murmurs AS  Rhythm:Regular Rate:Normal + Systolic murmurs Echo 34/9179 1. Left ventricular ejection fraction, by estimation, is 60 to 65%. The left ventricle has normal function. The left ventricle has no regional wall motion abnormalities. There is mild left ventricular hypertrophy. Left ventricular diastolic parameters are consistent with Grade II diastolic dysfunction (pseudonormalization).  2. Right ventricular systolic function is normal. The right ventricular size is normal. Tricuspid regurgitation signal is inadequate for assessing PA pressure.  3. Left atrial size was moderately dilated.  4. Status post tricuspid valve repair. Trivial tricuspid regurgitation.  No significant stenosis with mean gradient 2 mmHg.  5. Status post mitral valve repair. Trivial mitral regurgitation. Mean gradient 4 mmHg, mild mitral stenosis.  6. Bioprosthetic aortic valve s/p TAVR (23 mm Edwards Sapien). Trivial peri-valvular leakage. Mean gradient 16 mmHg, mildly elevated.  7. The inferior vena cava is normal in size with greater than 50% respiratory variability, suggesting right atrial pressure of 3 mmHg.    Cath  Mid RCA lesion is 50% stenosed. RPDA lesion is 50% stenosed. Prox RCA lesion is 30% stenosed. Mid Cx lesion is 20% stenosed. Ost LAD to Prox LAD  lesion is 40% stenosed. Mid LAD lesion is 30% stenosed.   1. Mild non-obstructive disease in the LAD and Circumflex 2. Moderate non-obstructive disease in the mid and distal RCA, PDA.  3. Severe aortic stenosis by echo. Unable to cross the aortic valve in the cath lab from the radial approach.     TTE 03/04/19: EF 65-70%, mild LVH, grade II  diastolic dysfunction, mild LAE, mild MS, mild TR   Hx of TAVR   Neuro/Psych PSYCHIATRIC DISORDERS Anxiety Depression CVA    GI/Hepatic Neg liver ROS, GERD  Medicated and Controlled,  Endo/Other  diabetes, Type 2, Insulin Dependent  Renal/GU negative Renal ROS     Musculoskeletal  (+) Arthritis ,   Abdominal   Peds  Hematology negative hematology ROS (+) anemia ,   Anesthesia Other Findings   Reproductive/Obstetrics negative OB ROS                           Anesthesia Physical Anesthesia Plan  ASA: 4  Anesthesia Plan: MAC   Post-op Pain Management:    Induction: Intravenous  PONV Risk Score and Plan: 3 and Propofol infusion, TIVA, Treatment may vary due to age or medical condition and Ondansetron  Airway Management Planned: Natural Airway  Additional Equipment:   Intra-op Plan:   Post-operative Plan:   Informed Consent: I have reviewed the patients History and Physical, chart, labs and discussed the procedure including the risks, benefits and alternatives for the proposed anesthesia with the patient or authorized representative who has indicated his/her understanding and acceptance.     Dental advisory given  Plan Discussed with: CRNA  Anesthesia Plan Comments: (SOB with exertion.)       Anesthesia  Quick Evaluation

## 2021-01-22 NOTE — Anesthesia Procedure Notes (Signed)
Procedure Name: MAC Date/Time: 01/22/2021 10:04 AM Performed by: Lissa Morales, CRNA Pre-anesthesia Checklist: Patient identified, Emergency Drugs available, Suction available and Patient being monitored Patient Re-evaluated:Patient Re-evaluated prior to induction Oxygen Delivery Method: Simple face mask Placement Confirmation: positive ETCO2

## 2021-01-25 ENCOUNTER — Encounter (HOSPITAL_COMMUNITY): Payer: Self-pay | Admitting: Gastroenterology

## 2021-01-25 NOTE — Anesthesia Postprocedure Evaluation (Signed)
Anesthesia Post Note  Patient: Rachel Santos  Procedure(s) Performed: COLONOSCOPY WITH PROPOFOL     Patient location during evaluation: PACU Anesthesia Type: MAC Level of consciousness: awake and alert Pain management: pain level controlled Vital Signs Assessment: post-procedure vital signs reviewed and stable Respiratory status: spontaneous breathing Cardiovascular status: stable Anesthetic complications: no   No notable events documented.  Last Vitals:  Vitals:   01/22/21 1053 01/22/21 1103  BP: (!) 151/33 (!) 158/46  Pulse: (!) 51 (!) 52  Resp: 17 19  Temp:    SpO2: 100% 99%    Last Pain:  Vitals:   01/22/21 1103  TempSrc:   PainSc: 0-No pain                 Nolon Nations

## 2021-01-26 DIAGNOSIS — E1165 Type 2 diabetes mellitus with hyperglycemia: Secondary | ICD-10-CM | POA: Diagnosis not present

## 2021-02-03 ENCOUNTER — Other Ambulatory Visit: Payer: Self-pay | Admitting: Geriatric Medicine

## 2021-02-03 DIAGNOSIS — Z1231 Encounter for screening mammogram for malignant neoplasm of breast: Secondary | ICD-10-CM

## 2021-02-17 DIAGNOSIS — K219 Gastro-esophageal reflux disease without esophagitis: Secondary | ICD-10-CM | POA: Diagnosis not present

## 2021-02-17 DIAGNOSIS — I480 Paroxysmal atrial fibrillation: Secondary | ICD-10-CM | POA: Diagnosis not present

## 2021-02-17 DIAGNOSIS — E7800 Pure hypercholesterolemia, unspecified: Secondary | ICD-10-CM | POA: Diagnosis not present

## 2021-02-17 DIAGNOSIS — N1831 Chronic kidney disease, stage 3a: Secondary | ICD-10-CM | POA: Diagnosis not present

## 2021-02-17 DIAGNOSIS — I129 Hypertensive chronic kidney disease with stage 1 through stage 4 chronic kidney disease, or unspecified chronic kidney disease: Secondary | ICD-10-CM | POA: Diagnosis not present

## 2021-02-17 DIAGNOSIS — E1121 Type 2 diabetes mellitus with diabetic nephropathy: Secondary | ICD-10-CM | POA: Diagnosis not present

## 2021-03-05 DIAGNOSIS — E1165 Type 2 diabetes mellitus with hyperglycemia: Secondary | ICD-10-CM | POA: Diagnosis not present

## 2021-03-09 ENCOUNTER — Encounter: Payer: Self-pay | Admitting: *Deleted

## 2021-03-09 ENCOUNTER — Ambulatory Visit: Payer: Medicare Other | Admitting: Psychiatry

## 2021-03-09 DIAGNOSIS — K219 Gastro-esophageal reflux disease without esophagitis: Secondary | ICD-10-CM | POA: Diagnosis not present

## 2021-03-09 DIAGNOSIS — N1831 Chronic kidney disease, stage 3a: Secondary | ICD-10-CM | POA: Diagnosis not present

## 2021-03-09 DIAGNOSIS — E7800 Pure hypercholesterolemia, unspecified: Secondary | ICD-10-CM | POA: Diagnosis not present

## 2021-03-09 DIAGNOSIS — I480 Paroxysmal atrial fibrillation: Secondary | ICD-10-CM | POA: Diagnosis not present

## 2021-03-09 DIAGNOSIS — I129 Hypertensive chronic kidney disease with stage 1 through stage 4 chronic kidney disease, or unspecified chronic kidney disease: Secondary | ICD-10-CM | POA: Diagnosis not present

## 2021-03-09 DIAGNOSIS — C50912 Malignant neoplasm of unspecified site of left female breast: Secondary | ICD-10-CM | POA: Diagnosis not present

## 2021-03-09 DIAGNOSIS — E1121 Type 2 diabetes mellitus with diabetic nephropathy: Secondary | ICD-10-CM | POA: Diagnosis not present

## 2021-03-10 ENCOUNTER — Ambulatory Visit: Payer: Medicare Other

## 2021-03-11 ENCOUNTER — Ambulatory Visit: Payer: Medicare Other | Admitting: Psychiatry

## 2021-04-04 DIAGNOSIS — E1165 Type 2 diabetes mellitus with hyperglycemia: Secondary | ICD-10-CM | POA: Diagnosis not present

## 2021-04-13 ENCOUNTER — Ambulatory Visit
Admission: RE | Admit: 2021-04-13 | Discharge: 2021-04-13 | Disposition: A | Discharge status: Home / Self Care | Payer: Medicare Other | Source: Ambulatory Visit | Attending: Geriatric Medicine | Admitting: Geriatric Medicine

## 2021-04-13 ENCOUNTER — Other Ambulatory Visit: Payer: Self-pay

## 2021-04-13 DIAGNOSIS — Z1231 Encounter for screening mammogram for malignant neoplasm of breast: Secondary | ICD-10-CM | POA: Diagnosis not present

## 2021-04-13 DIAGNOSIS — Z79899 Other long term (current) drug therapy: Secondary | ICD-10-CM | POA: Diagnosis not present

## 2021-04-13 DIAGNOSIS — E1121 Type 2 diabetes mellitus with diabetic nephropathy: Secondary | ICD-10-CM | POA: Diagnosis not present

## 2021-04-14 ENCOUNTER — Other Ambulatory Visit: Payer: Self-pay | Admitting: Geriatric Medicine

## 2021-04-14 DIAGNOSIS — R928 Other abnormal and inconclusive findings on diagnostic imaging of breast: Secondary | ICD-10-CM

## 2021-04-19 ENCOUNTER — Other Ambulatory Visit: Payer: Self-pay

## 2021-04-19 ENCOUNTER — Encounter: Payer: Self-pay | Admitting: Psychiatry

## 2021-04-19 ENCOUNTER — Ambulatory Visit: Payer: Medicare Other | Admitting: Psychiatry

## 2021-04-19 ENCOUNTER — Ambulatory Visit
Admission: RE | Admit: 2021-04-19 | Discharge: 2021-04-19 | Disposition: A | Discharge status: Home / Self Care | Payer: Medicare Other | Source: Ambulatory Visit | Attending: Geriatric Medicine | Admitting: Geriatric Medicine

## 2021-04-19 ENCOUNTER — Other Ambulatory Visit: Payer: Self-pay | Admitting: Geriatric Medicine

## 2021-04-19 VITALS — BP 155/65 | HR 64 | Ht 66.0 in | Wt 149.0 lb

## 2021-04-19 DIAGNOSIS — R920 Mammographic microcalcification found on diagnostic imaging of breast: Secondary | ICD-10-CM | POA: Diagnosis not present

## 2021-04-19 DIAGNOSIS — R413 Other amnesia: Secondary | ICD-10-CM

## 2021-04-19 DIAGNOSIS — R922 Inconclusive mammogram: Secondary | ICD-10-CM | POA: Diagnosis not present

## 2021-04-19 DIAGNOSIS — R928 Other abnormal and inconclusive findings on diagnostic imaging of breast: Secondary | ICD-10-CM | POA: Diagnosis not present

## 2021-04-19 DIAGNOSIS — R921 Mammographic calcification found on diagnostic imaging of breast: Secondary | ICD-10-CM | POA: Diagnosis not present

## 2021-04-19 NOTE — Patient Instructions (Addendum)
Blood work - vitamin B12, TSH Try melatonin at bedtime to help maintain a regular sleep schedule. If still having trouble with sleep let me know and I can refer to Sleep medicine   Preventing Falls at [facility] are common, often dreaded events in the lives of older people. Aside from the obvious injuries and even death that may result, fall can cause wide-ranging consequences including loss of independence, mental decline, decreased activity and mobility. Younger people are also at risk of falling, especially those with chronic illnesses and fatigue.  Ways to reduce risk for falling   Examine diet and medications. Warm foods and alcohol dilate blood vessels, which can lead to dizziness when standing. Sleep aids, antidepressants and pain medications can also increase the likelihood of a fall.   Get a vision exam. Poor vision, cataracts and glaucoma increase the chances of falling.   Check foot gear. Shoes should fit snugly and have a sturdy, nonskid sole and a broad, low heel   Participate in a physician-approved exercise program to build and maintain muscle strength and improve balance and coordination. Programs that use ankle weights or stretch bands are excellent for muscle-strengthening. Water aerobics programs and low-impact Tai Chi programs have also been shown to improve balance and coordination.   Increase vitamin D intake. Vitamin D improves muscle strength and increases the amount of calcium the body is able to absorb and deposit in bones.  How to prevent falls from common hazards   Floors -- Remove all loose wires, cords, and throw rugs. Minimize clutter. Make sure rugs are anchored and smooth. Keep furniture in its usual place.   Chairs -- Use chairs with straight backs, armrests and firm seats. Add firm cushions to existing pieces to add height.   Bathroom -- Install grab bars and non-skid tape in the tub or shower. Use a bathtub transfer bench or a shower chair with a back support Use an  elevated toilet seat and/or safety rails to assist standing from a low surface. Do not use towel racks or bathroom tissue holders to help you stand.   Lighting -- Make sure halls, stairways, and entrances are well-lit. Install a night light in your bathroom or hallway. Make sure there is a light switch at the top and bottom of the staircase. Turn lights on if you get up in the middle of the night. Make sure lamps or light switches are within reach of the bed if you have to get up during the night.   Kitchen -- Install non-skid rubber mats near the sink and stove. Clean spills immediately. Store frequently used utensils, pots, pans between waist and eye level. This helps prevent reaching and bending. Sit when getting things out of lower cupboards.   Living room / [place] furniture with wide spaces in between, giving enough room to move around. Establish a route through the living room that gives you something to hold onto as you walk.   Stairs -- Make sure treads, rails, and rugs are secure. Install a rail on both sides of the stairs. If stairs are a threat, it might be helpful to arrange most of your activities on the lower level to reduce the number of times you must climb the stairs.   Entrances and doorways -- Install metal handles on the walls adjacent to the doorknobs of all doors to make it more secure as you travel through the doorway.   Tips for maintaining balance   Keep at least one  hand free at all times. Try using a backpack or fanny pack to hold things rather than carrying them in your hands. Never carry objects in both hands when walking as this interferes with keeping your balance.   Attempt to swing both arms from front to back while walking. It will help you to maintain balance and posture, and reduce fatigue.   Consciously lift your feet off of the ground when walking. Shuffling and dragging of the feet is a common culprit in losing your balance.   When trying to navigate turns,  use a "U" technique of facing forward and making a wide turn, rather than pivoting sharply.   Try to stand with your feet shoulder-length apart. When your feet are close together for any length of time, you increase your risk of losing your balance and falling.   Do one thing at a time. Don't try to walk and accomplish another task, such as reading or looking around. The decrease in your automatic reflexes complicates motor function, so the less distraction, the better.   Do not wear rubber or gripping soled shoes, they might "catch" on the floor and cause tripping.   Move slowly when changing positions. Use deliberate, concentrated movements and, if needed, use a grab bar or walking aid. Count 15 seconds between each movement. For example, when rising from a seated position, wait 15 seconds after standing to begin walking.   If balance is a continuous problem, you might want to consider a walking aid such as a cane, walking stick, or walker. Once you've mastered walking with help, you might be ready to try it on your own again.   Ways to improve sleep These are ways to improve your habits to try to get more and better sleep. Doctors call this following good "sleep hygiene." That means that you: ?Go to bed and get up at the same time every day ?Have coffee, tea, and other foods that have caffeine only in the morning ?Avoid alcohol in the late afternoon, evening, and bedtime ?Avoid smoking, especially in the evening ?Keep your bedroom dark, cool, quiet, and free of reminders of work or other things that cause you stress ?Try to solve problems you have before you go to bed ?Exercise several days a week, but not right before bed ?Avoid looking at phones or reading devices ("e-books") that give off light before bed. This can make it harder to fall asleep. ?Avoid long naps if you have trouble sleeping at night, especially in the late afternoon. Short naps (about 20 minutes) can be helpful, especially if  your work schedule changes day to day and you need to be alert at different times. Other things that can improve sleep include: ?Relaxation therapy, in which you focus on relaxing all the muscles in your body 1 at a time ?Working with a Engineer, materials to deal with the problems that might be causing you to not get enough sleep

## 2021-04-19 NOTE — Progress Notes (Signed)
[facility]  PATIENT: Rachel Santos DOB: 07-14-1940  REFERRING CLINICIAN: Lajean Manes, MD HISTORY FROM: self, daughter REASON FOR VISIT: memory loss   HISTORICAL  CHIEF COMPLAINT:  Chief Complaint  Patient presents with   Memory Loss    Rm 1 New Pt dgtr- [name]  MMSE 25    HISTORY OF PRESENT ILLNESS:  The patient presents for evaluation of memory loss which has been gradually getting worse over the past several years. Will forget what she ate last night or for breakfast that morning. Forgets to take her medication, partially because she has fragmented sleep and will take her medication at different times every day.  Notes that she got hearing aids one year ago but needs new ones as her current hearing aids are not working very well.  Notes some trouble with balance due to a bad knee, but no recent falls.  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 TTE with moderately dilated LA and EF 60-65%. CTA with stenosis of left MCA, basilar artery, and left PCA. Seizures:  no past history of seizures Sleep: Doesn't sleep well at night.  Has trouble staying asleep. Stays up and night and can sleep all day. Does not think she snores at night, has not woken up gasping for air at night. Mood: Patient denies anxiety and depression. Daughter thinks she sometimes seems a little grumpier than normal.  Functional status:  Patient lives alone. Daughter lives close by. Cooking: She does not do much cooking. Daughter states she will forget to eat meals. Never left the stove on. Has diabetes and blood sugar will get very high or low due to inconsistent meals. Cleaning: Cleans the house without issues Shopping: She writes the list and daughter will do the shopping.  Driving: She has not driven for six years due to difficulty seeing at night. No car accidents or getting lost when she was  driving Bills: Daughter handles the finances because she has a computer Medications: Forgets to take medications sometimes. Takes her medications at different times every day because of her sleep pattern Ever left the stove on by accident?: no Forget how to use items around the house?: no Getting lost going to familiar places?: no Forgetting loved ones names?: no Word finding difficulty? yes  OTHER MEDICAL CONDITIONS: afib, remote CVA, HTN, DM, CKD, arthritis, depression   REVIEW OF SYSTEMS: Full 14 system review of systems performed and negative with exception of: memory loss  ALLERGIES: Allergies  Allergen Reactions   Buspirone Hcl     lethargic/tired   Metformin And Related Diarrhea   Ozempic (0.25 Or 0.5 Mg-Dose) [Semaglutide(0.25 Or 0.5mg -Dos)]     Other reaction(s): nausea   Ultram [Tramadol] Nausea And Vomiting   Nickel Rash    Pt unable to wear jewelry made of nickel.   Pneumococcal Vaccines Swelling and Rash    At injection set    HOME MEDICATIONS: Outpatient Medications Prior to Visit  Medication Sig Dispense Refill   acetaminophen (TYLENOL) 500 MG tablet Take 1,000 mg by mouth every 6 (six) hours as needed for moderate pain or headache.     atorvastatin (LIPITOR) 80 MG tablet Take 1 tablet (80 mg total) by mouth daily. (Patient taking differently: Take 80 mg by mouth at bedtime.) 30 tablet 11   Calcium Carb-Cholecalciferol (CALCIUM 600 + D PO) Take 1 tablet by mouth 2 (two) times  daily.     clopidogrel (PLAVIX) 75 MG tablet Take 1 tablet (75 mg total) by mouth daily. 90 tablet 3   dapagliflozin propanediol (FARXIGA) 10 MG TABS tablet Take 10 mg by mouth daily.     docusate sodium (COLACE) 100 MG capsule Take 100-200 mg by mouth daily as needed for mild constipation.     insulin detemir (LEVEMIR) 100 UNIT/ML injection 50 Units daily.     insulin regular (NOVOLIN R) 100 units/mL injection Inject 15 Units into the skin daily after supper.     magnesium hydroxide (MILK  OF MAGNESIA) 400 MG/5ML suspension Take 30 mLs by mouth daily as needed for indigestion.      Multiple Vitamins-Minerals (MULTIVITAMIN WITH MINERALS) tablet Take 1 tablet by mouth daily.     omeprazole (PRILOSEC) 40 MG capsule Take 40 mg by mouth daily before breakfast.      Rivaroxaban (XARELTO) 15 MG TABS tablet Take 1 tablet (15 mg total) by mouth daily with supper. (Patient taking differently: Take 15 mg by mouth daily.) 30 tablet 11   venlafaxine XR (EFFEXOR-XR) 150 MG 24 hr capsule Take 150 mg by mouth daily with breakfast. Take with 75 mg capsule to equal 225 mg daily     venlafaxine XR (EFFEXOR-XR) 75 MG 24 hr capsule Take 75 mg by mouth daily with breakfast. Take with 150 mg capsule to equal 225 mg daily     vitamin B-12 (CYANOCOBALAMIN) 500 MCG tablet Take 500 mcg by mouth every Monday.     nitroGLYCERIN (NITROSTAT) 0.4 MG SL tablet Place 1 tablet (0.4 mg total) under the tongue every 5 (five) minutes as needed for chest pain. 25 tablet 3   No facility-administered medications prior to visit.    PAST MEDICAL HISTORY: Past Medical History:  Diagnosis Date   Anxiety    Arthritis    knees   Atrial fibrillation (HCC)    CAD (coronary artery disease)    a. pre TAVR cath 2020 mild nonobstructive CAD in the LAD and CX with moderate nononstructive disease in the mid-distal RCA and PDA.   Cancer ([place])    left breast   Cataracts, bilateral    CKD (chronic kidney disease), stage III (HCC)    CVA (cerebral vascular accident) ([place])    Depression    Diabetes mellitus    DVT (deep venous thrombosis) (HCC)    hx of   Dyspnea    with exertion   Full dentures    GERD (gastroesophageal reflux disease)    Groin hematoma    Hearing aid worn    B/L   History of TIA (transient ischemic attack)    mini stroke with vision problems   Hypercholesterolemia    Hypertension    Hypertensive nephropathy    MCI (mild cognitive impairment)    Memory loss    Middle cerebral artery stenosis, left     Mitral regurgitation    Paroxysmal atrial fibrillation (HCC)    Pneumonia    PONV (postoperative nausea and vomiting)    Premature atrial contraction    S/P Maze operation for atrial fibrillation 10/11/2012   Complete bilateral atrial lesion set using bipolar radiofrequency and cryothermy ablation with clipping of LA appendage   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   58mm Sorin Memo 3D ring annuloplasty with 26 mm Edwards mc3 tricuspid ring annuloplasty    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   s/p TAVR with a 23 Edwards Sapien 3 Ultra THV via the TF approach  Thyroid nodule    Tricuspid regurgitation    Wears glasses     PAST SURGICAL HISTORY: Past Surgical History:  Procedure Laterality Date   ARTERY REPAIR Right 01/22/2019   Procedure: Right Common Femoral Artery Repair;  Surgeon: Angelia Mould, MD;  Location: [place];  Service: Vascular;  Laterality: Right;   CARDIAC CATHETERIZATION  09-05-12   CARDIOVERSION N/A 07/31/2012   Procedure: CARDIOVERSION;  Surgeon: Josue Hector, MD;  Location: [place];  Service: Cardiovascular;  Laterality: N/A;   CATARACT EXTRACTION W/ INTRAOCULAR LENS  IMPLANT, BILATERAL     CESAREAN SECTION     x 2   COLONOSCOPY WITH PROPOFOL N/A 11/28/2014   Procedure: COLONOSCOPY WITH PROPOFOL;  Surgeon: Carol Ada, MD;  Location: [facility];  Service: Endoscopy;  Laterality: N/A;   COLONOSCOPY WITH PROPOFOL N/A 01/22/2021   Procedure: COLONOSCOPY WITH PROPOFOL;  Surgeon: Carol Ada, MD;  Location: [facility];  Service: Endoscopy;  Laterality: N/A;   DILATION AND CURETTAGE OF UTERUS     FEMORAL ARTERY EXPLORATION Right 01/22/2019   Procedure: RIGHT GROIN EXPLORATION with evacuation of hematoma;  Surgeon: Angelia Mould, MD;  Location: [facility];  Service: Vascular;  Laterality: Right;   FLEXIBLE SIGMOIDOSCOPY N/A 12/18/2015   Procedure: FLEXIBLE SIGMOIDOSCOPY;  Surgeon: Carol Ada, MD;  Location: [facility];   Service: Endoscopy;  Laterality: N/A;   INTRAOPERATIVE TRANSESOPHAGEAL ECHOCARDIOGRAM N/A 10/11/2012   Procedure: INTRAOPERATIVE TRANSESOPHAGEAL ECHOCARDIOGRAM;  Surgeon: Rexene Alberts, MD;  Location: [place];  Service: Open Heart Surgery;  Laterality: N/A;   IR ANGIO INTRA EXTRACRAN SEL COM CAROTID INNOMINATE BILAT MOD SED  05/21/2019   IR ANGIO VERTEBRAL SEL VERTEBRAL BILAT MOD SED  05/21/2019   MASTECTOMY Left 1982   MAZE N/A 10/11/2012   Procedure: MAZE;  Surgeon: Rexene Alberts, MD;  Location: [place];  Service: Open Heart Surgery;  Laterality: N/A;   MITRAL VALVE REPAIR N/A 10/11/2012   Procedure: MITRAL VALVE REPAIR (MVR);  Surgeon: Rexene Alberts, MD;  Location: [place];  Service: Open Heart Surgery;  Laterality: N/A;   MULTIPLE TOOTH EXTRACTIONS     RADIOLOGY WITH ANESTHESIA N/A 06/19/2019   Procedure: STENTING;  Surgeon: Luanne Bras, MD;  Location: [place];  Service: Radiology;  Laterality: N/A;   RIGHT HEART CATH AND CORONARY ANGIOGRAPHY N/A 12/24/2018   Procedure: RIGHT HEART CATH AND CORONARY ANGIOGRAPHY;  Surgeon: [name], MD;  Location: [facility];  Service: Cardiovascular;  Laterality: N/A;   TEE WITHOUT CARDIOVERSION N/A 07/31/2012   Procedure: TRANSESOPHAGEAL ECHOCARDIOGRAM (TEE);  Surgeon: Josue Hector, MD;  Location: [facility];  Service: Cardiovascular;  Laterality: N/A;   TEE WITHOUT CARDIOVERSION N/A 01/22/2019   Procedure: TRANSESOPHAGEAL ECHOCARDIOGRAM (TEE);  Surgeon: [name], MD;  Location: [facility];  Service: Open Heart Surgery;  Laterality: N/A;   TRANSCATHETER AORTIC VALVE REPLACEMENT, TRANSFEMORAL N/A 01/22/2019   Procedure: TRANSCATHETER AORTIC VALVE REPLACEMENT, TRANSFEMORAL;  Surgeon: [name], MD;  Location: [facility];  Service: Open Heart Surgery;  Laterality: N/A;   TRICUSPID VALVE REPLACEMENT N/A 10/11/2012   Procedure: TRICUSPID VALVE REPAIR;  Surgeon: Rexene Alberts, MD;  Location: [place];  Service: Open Heart Surgery;  Laterality: N/A;   TUBAL LIGATION      FAMILY HISTORY: Family History  Problem Relation Age of Onset   Alcohol abuse Mother    Cancer Father        prostate   Hypertension Father  Heart attack Paternal Grandfather    Stroke Neg Hx     SOCIAL HISTORY: Social History   Socioeconomic History   Marital status: Widowed    Spouse name: Not on file   Number of children: 3   Years of education: Not on file   Highest education level: High school graduate  Occupational History   Occupation: Air cabin crew  Tobacco Use   Smoking status: Unknown   Smokeless tobacco: Never  Vaping Use   Vaping Use: Never used  Substance and Sexual Activity   Alcohol use: No   Drug use: No   Sexual activity: Never  Other Topics Concern   Not on file  Social History Narrative   04/19/21 lives alone, dgtr lives close by   Social Determinants of Health   Financial Resource Strain: Not on file  Food Insecurity: Not on file  Transportation Needs: Not on file  Physical Activity: Not on file  Stress: Not on file  Social Connections: Not on file  Intimate Partner Violence: Not on file     PHYSICAL EXAM  GENERAL EXAM/CONSTITUTIONAL: Vitals:  Vitals:   04/19/21 0909  BP: (!) 155/65  Pulse: 64  Weight: 149 lb (67.6 kg)  Height: 5\' 6"  (1.676 m)   Body mass index is 24.05 kg/m. Wt Readings from Last 3 Encounters:  04/19/21 149 lb (67.6 kg)  01/22/21 138 lb (62.6 kg)  11/10/20 145 lb (65.8 kg)   Patient is in no distress; well developed, nourished and groomed; neck is supple  CARDIOVASCULAR: Examination of peripheral vascular system by observation and palpation is normal  EYES: Pupils round and reactive to light, Visual fields full to confrontation, Extraocular movements intact  MUSCULOSKELETAL: Gait, strength, tone, movements noted in Neurologic exam below  NEUROLOGIC: MENTAL STATUS:  MMSE - Mini Mental State Exam  04/19/2021  Orientation to time 5  Orientation to Place 4  Registration 3  Attention/ Calculation 2  Recall 3  Language- name 2 objects 2  Language- repeat 1  Language- follow 3 step command 3  Language- read & follow direction 1  Write a sentence 1  Copy design 0  Copy design-comments only drew 1/2 of it  Total score 25    CRANIAL NERVE:  2nd, 3rd, 4th, 6th - pupils equal and reactive to light, visual fields full to confrontation, extraocular muscles intact, no nystagmus 5th - facial sensation symmetric 7th - facial strength symmetric 8th - diminished hearing bilaterally (wears hearing aids at baseline) 9th - palate elevates symmetrically, uvula midline 11th - shoulder shrug symmetric 12th - tongue protrusion midline  MOTOR:  normal bulk and tone, no cogwheeling, full strength in the BUE, BLE  SENSORY:  normal and symmetric to light touch all 4 extremities  COORDINATION:  finger-nose-finger normal, fine finger movements normal, no tremor  REFLEXES:  deep tendon reflexes present and symmetric  GAIT/STATION:  Shortened stride length, shuffling gait     DIAGNOSTIC DATA (LABS, IMAGING, TESTING) - I reviewed patient records, labs, notes, testing and imaging myself where available.  Lab Results  Component Value Date   WBC 6.1 06/27/2019   HGB 13.4 06/27/2019   HCT 41.2 06/27/2019   MCV 94.7 06/27/2019   PLT 204 06/27/2019      Component Value Date/Time   NA 137 06/27/2019 0642   NA 137 12/17/2018 1006   K 3.7 06/27/2019 0642   CL 100 06/27/2019 0642   CO2 25 06/27/2019 0642   GLUCOSE 220 (H) 06/27/2019 4097  BUN 28 (H) 06/27/2019 0642   BUN 23 12/17/2018 1006   CREATININE 0.90 01/13/2021 1418   CALCIUM 9.4 06/27/2019 0642   PROT 6.6 01/18/2019 0956   ALBUMIN 4.0 01/18/2019 0956   AST 24 01/18/2019 0956   ALT 21 01/18/2019 0956   ALKPHOS 58 01/18/2019 0956   BILITOT 0.6 01/18/2019 0956   GFRNONAA 47 (L) 06/27/2019 0642   GFRAA 55 (L) 06/27/2019 0642    Lab Results  Component Value Date   CHOL 112 01/23/2019   HDL 38 (L) 01/23/2019   LDLCALC 44 01/23/2019   TRIG 149 01/23/2019   CHOLHDL 2.9 01/23/2019   Lab Results  Component Value Date   HGBA1C 7.0 (H) 01/18/2019   No results found for: VITAMINB12 Lab Results  Component Value Date   TSH 4.380 05/27/2019    CTH 11/10/20: atrophy and minimal chronic small vessel ischemic changes  CTA head/neck 01/13/21: narrowing of left MCA trifurcation, moderate stenosis of proximal basilar artery, and left P2/3 PCA stenoses  MRI brain 01/24/19: 1. Multifocal acute ischemia scattered within both hemispheres, left-greater-than-right, and in the left cerebellum. The pattern is most consistent with a central embolic source, particularly in a patient with a history of atrial fibrillation. 2. No emergent large vessel occlusion or high-grade stenosis. 3. Mild intracranial atherosclerosis.   ASSESSMENT AND PLAN  81 y.o. year old female with a history of afib, remote CVA, HTN, DM, CKD, arthritis, depression who presents for evaluation of memory loss over the past several years. MMSE today is 25, which is within normal limits. CTH last year shows moderate atrophy. Discussed how fragmented sleep and untreated hearing loss can contribute to memory issues. Recommended she obtain new hearing aids and start taking melatonin to help her sleep at night. If she is unable to regulate her sleep schedule with lifestyle changes and melatonin will consider referral to Sleep medicine for evaluation of an underlying sleep disorder which may be contributing to her symptoms.  Offered referral to PT for balance training, which patient declined at this time. Home fall precautions information provided.   1. Memory loss       PLAN: - Labs: B12, TSH - Sleep hygiene information provided. Try Melatonin at bedtime to help with sleep - Recommended patient obtain working hearing aids - Fall precautions at home  information provided - Consider neuropsych testing/medication if memory issues persist with improved sleep and hearing. Consider PT for balance  Orders Placed This Encounter  Procedures   Vitamin B12   TSH     Return in about 6 months (around 10/17/2021).  I spent an average of 43 minutes chart reviewing and counseling the patient, with at least 50% of the time face to face with the patient. General brain health measures discussed, including the importance of regular sleep schedule.   Genia Harold, MD 04/19/21 10:11 AM  [facility] [address] [place], [place] [address] [phone]

## 2021-04-20 LAB — VITAMIN B12: Vitamin B-12: 745 pg/mL (ref 232–1245)

## 2021-04-20 LAB — TSH: TSH: 4.94 u[IU]/mL — ABNORMAL HIGH (ref 0.450–4.500)

## 2021-04-25 ENCOUNTER — Encounter: Payer: Self-pay | Admitting: Cardiovascular Disease

## 2021-04-26 ENCOUNTER — Telehealth: Payer: Self-pay | Admitting: Psychiatry

## 2021-04-26 NOTE — Telephone Encounter (Signed)
I have sent results to Dr. Carlyle Lipa office Fax # [phone]. Confirmation received.

## 2021-04-26 NOTE — Telephone Encounter (Signed)
Rachel Santos from Dr. Carlyle Lipa office called needing the B12 and Thyroid lab work results. Please call Rachel Santos back to discuss. Please make sure you say you need to speak to [facility] with a K since there is two of them but one is with a C.

## 2021-04-27 DIAGNOSIS — I129 Hypertensive chronic kidney disease with stage 1 through stage 4 chronic kidney disease, or unspecified chronic kidney disease: Secondary | ICD-10-CM | POA: Diagnosis not present

## 2021-04-27 DIAGNOSIS — C50912 Malignant neoplasm of unspecified site of left female breast: Secondary | ICD-10-CM | POA: Diagnosis not present

## 2021-04-27 DIAGNOSIS — E1121 Type 2 diabetes mellitus with diabetic nephropathy: Secondary | ICD-10-CM | POA: Diagnosis not present

## 2021-04-27 DIAGNOSIS — E7800 Pure hypercholesterolemia, unspecified: Secondary | ICD-10-CM | POA: Diagnosis not present

## 2021-04-27 DIAGNOSIS — I480 Paroxysmal atrial fibrillation: Secondary | ICD-10-CM | POA: Diagnosis not present

## 2021-04-27 DIAGNOSIS — N1831 Chronic kidney disease, stage 3a: Secondary | ICD-10-CM | POA: Diagnosis not present

## 2021-04-27 DIAGNOSIS — K219 Gastro-esophageal reflux disease without esophagitis: Secondary | ICD-10-CM | POA: Diagnosis not present

## 2021-04-27 NOTE — Telephone Encounter (Signed)
Contacted Rachel Santos back, stated they did receive fax and everything is good.

## 2021-04-27 NOTE — Telephone Encounter (Signed)
LVM: Calling from Dr. Carlyle Lipa office [name]) daughter, [name] informed us that she had labs with neuro for her  B12 and thyroid. We just calling to have those transmitted over to [facility] or if can review over the phone. Ms [name] did note that they were a little high and she had some concerns. Trying to follow up with her to make sure everything alright with Ms. Mardene Celeste. If you can give [place] a call back at [phone]. May leave a detailed message if needed.

## 2021-04-28 ENCOUNTER — Other Ambulatory Visit: Payer: Self-pay

## 2021-04-28 ENCOUNTER — Encounter: Payer: Self-pay | Admitting: Cardiovascular Disease

## 2021-04-28 ENCOUNTER — Ambulatory Visit (INDEPENDENT_AMBULATORY_CARE_PROVIDER_SITE_OTHER): Payer: Medicare Other | Admitting: Cardiovascular Disease

## 2021-04-28 VITALS — BP 130/60 | HR 68 | Ht 66.0 in | Wt 147.6 lb

## 2021-04-28 DIAGNOSIS — E785 Hyperlipidemia, unspecified: Secondary | ICD-10-CM

## 2021-04-28 DIAGNOSIS — Z952 Presence of prosthetic heart valve: Secondary | ICD-10-CM | POA: Diagnosis not present

## 2021-04-28 DIAGNOSIS — I480 Paroxysmal atrial fibrillation: Secondary | ICD-10-CM | POA: Diagnosis not present

## 2021-04-28 DIAGNOSIS — I2510 Atherosclerotic heart disease of native coronary artery without angina pectoris: Secondary | ICD-10-CM

## 2021-04-28 DIAGNOSIS — I10 Essential (primary) hypertension: Secondary | ICD-10-CM | POA: Diagnosis not present

## 2021-04-28 DIAGNOSIS — I350 Nonrheumatic aortic (valve) stenosis: Secondary | ICD-10-CM

## 2021-04-28 NOTE — Progress Notes (Signed)
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 I saw her as a new patient for evaluation of atrial fibrillation and severe MR on 07/03/12. She was diagnosed with atrial fibrillation April of 2014 as well as severe MR and TR. She had a TEE guided cardioversion in May 2016 with restoration of sinus rhythm. Her MR was noted to be central and severe. The LV cavity was not dilated. LV systolic function was normal. There was moderate to severe TR as well. She was felt to be symptomatic from her mitral valve disease. Cardiac cath on 09/05/12 with mild CAD. She underwent placement of mitral valve ring and tricuspid valve ring as well as MAZE procedure per Dr. Roxy Manns on 10/11/12.  Echo 11/23/18 with LVEF over 65%. Moderate LVH. Stable mitral and tricuspid repair.Severe aortic stenosis noted. Cardiac cath September 2020 with mild to moderate non-obstructive CAD. She underwent successful TAVR with a 23 mm Edwards Sapien 3 THV via the TF approach on 01/22/19. Post operative echo showed EF 65-70%, normally functioning TAVR with mean gradient of 13 mm Hg and mild PVL. Unfortunately, her TAVR was complicated by an extensive right groin hematoma requiring surgical repair and blood transfusions as well as cardioembolic CVA. MRA showed multifocal acute ischemia scattered within both hemispheres, left-greater-than-right, and in the left cerebellum most consistent with central embolic source. She was restarted on Xarelto. She was readmitted to [facility] from 11/9-11/12/20 with groin pain and recurrent aphasia. Her Hg was noted to be down to 7.4 and she was transfused. CT abdomen pelvis showed a right inguinal hematoma and  small right pelvic hematoma. CT head showed no acute abnormality but MRI brain showed several small acute cortical-based infarcts in the left frontal, parietal and occipital lobes which may represent an embolic source.  CT angio of the head and neck showed severe narrowing of the left middle cerebral artery and moderate to severe narrowing of the left posterior cerebral artery.  She was evaluated by neurology and underwent full stroke work-up. Her stroke was felt to be most likely related to her intracranial atherosclerotic disease and a baby aspirin was added to her Xarelto. Cardiac monitor March 2021 with sinus with PACs. She has since undergone treatment of a left MCA stenosis and has been on Brilinta. ASA was stopped. Echo November 2021 with normal LV function, normally functioning AVR with trivial PVL.   She is here today for follow up. The patient denies any chest pain, dyspnea, palpitations, lower extremity edema, orthopnea, PND, dizziness, near syncope or syncope. Overall doing well. Doing household chores. Some memory issues per daughter.   Primary Care Physician: Lajean Manes, MD  Past Medical History:  Diagnosis Date   Anxiety    Arthritis    knees   Atrial fibrillation ([place])    CAD (coronary artery disease)    a. pre TAVR cath 2020 mild nonobstructive CAD in the LAD and CX with moderate nononstructive disease in the mid-distal RCA and PDA.   Cancer [facility])    left breast   Cataracts, bilateral    CKD (chronic kidney disease), stage III (HCC)    CVA (cerebral vascular accident) [facility])    Depression  Diabetes mellitus    DVT (deep venous thrombosis) (HCC)    hx of   Dyspnea    with exertion   Full dentures    GERD (gastroesophageal reflux disease)    Groin hematoma    Hearing aid worn    B/L   History of TIA (transient ischemic attack)    mini stroke with vision problems   Hypercholesterolemia    Hypertension    Hypertensive nephropathy    MCI (mild cognitive impairment)     Memory loss    Middle cerebral artery stenosis, left    Mitral regurgitation    Paroxysmal atrial fibrillation (HCC)    Pneumonia    PONV (postoperative nausea and vomiting)    Premature atrial contraction    S/P Maze operation for atrial fibrillation 10/11/2012   Complete bilateral atrial lesion set using bipolar radiofrequency and cryothermy ablation with clipping of LA appendage   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   27mm Sorin Memo 3D ring annuloplasty with 26 mm Edwards mc3 tricuspid ring annuloplasty    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   s/p TAVR with a 23 Edwards Sapien 3 Ultra THV via the TF approach   Thyroid nodule    Tricuspid regurgitation    Wears glasses     Past Surgical History:  Procedure Laterality Date   ARTERY REPAIR Right 01/22/2019   Procedure: Right Common Femoral Artery Repair;  Surgeon: Angelia Mould, MD;  Location: [place];  Service: Vascular;  Laterality: Right;   CARDIAC CATHETERIZATION  09-05-12   CARDIOVERSION N/A 07/31/2012   Procedure: CARDIOVERSION;  Surgeon: Josue Hector, MD;  Location: [place];  Service: Cardiovascular;  Laterality: N/A;   CATARACT EXTRACTION W/ INTRAOCULAR LENS  IMPLANT, BILATERAL     CESAREAN SECTION     x 2   COLONOSCOPY WITH PROPOFOL N/A 11/28/2014   Procedure: COLONOSCOPY WITH PROPOFOL;  Surgeon: Carol Ada, MD;  Location: [facility];  Service: Endoscopy;  Laterality: N/A;   COLONOSCOPY WITH PROPOFOL N/A 01/22/2021   Procedure: COLONOSCOPY WITH PROPOFOL;  Surgeon: Carol Ada, MD;  Location: [facility];  Service: Endoscopy;  Laterality: N/A;   DILATION AND CURETTAGE OF UTERUS     FEMORAL ARTERY EXPLORATION Right 01/22/2019   Procedure: RIGHT GROIN EXPLORATION with evacuation of hematoma;  Surgeon: Angelia Mould, MD;  Location: [place];  Service: Vascular;  Laterality: Right;   FLEXIBLE SIGMOIDOSCOPY N/A 12/18/2015   Procedure: FLEXIBLE SIGMOIDOSCOPY;  Surgeon: Carol Ada, MD;  Location: [facility];  Service: Endoscopy;  Laterality: N/A;   INTRAOPERATIVE TRANSESOPHAGEAL ECHOCARDIOGRAM N/A 10/11/2012   Procedure: INTRAOPERATIVE TRANSESOPHAGEAL ECHOCARDIOGRAM;  Surgeon: Rexene Alberts, MD;  Location: [place];  Service: Open Heart Surgery;  Laterality: N/A;   IR ANGIO INTRA EXTRACRAN SEL COM CAROTID INNOMINATE BILAT MOD SED  05/21/2019   IR ANGIO VERTEBRAL SEL VERTEBRAL BILAT MOD SED  05/21/2019   MASTECTOMY Left 1982   MAZE N/A 10/11/2012   Procedure: MAZE;  Surgeon: Rexene Alberts, MD;  Location: [place];  Service: Open Heart Surgery;  Laterality: N/A;   MITRAL VALVE REPAIR N/A 10/11/2012   Procedure: MITRAL VALVE REPAIR (MVR);  Surgeon: Rexene Alberts, MD;  Location: [place];  Service: Open Heart Surgery;  Laterality: N/A;   MULTIPLE TOOTH EXTRACTIONS     RADIOLOGY WITH ANESTHESIA N/A 06/19/2019   Procedure: STENTING;  Surgeon: Luanne Bras, MD;  Location: [place];  Service: Radiology;  Laterality: N/A;   RIGHT HEART CATH AND CORONARY  ANGIOGRAPHY N/A 12/24/2018   Procedure: RIGHT HEART CATH AND CORONARY ANGIOGRAPHY;  Surgeon: [name], MD;  Location: [place] [facility];  Service: Cardiovascular;  Laterality: N/A;   TEE WITHOUT CARDIOVERSION N/A 07/31/2012   Procedure: TRANSESOPHAGEAL ECHOCARDIOGRAM (TEE);  Surgeon: Josue Hector, MD;  Location: [place];  Service: Cardiovascular;  Laterality: N/A;   TEE WITHOUT CARDIOVERSION N/A 01/22/2019   Procedure: TRANSESOPHAGEAL ECHOCARDIOGRAM (TEE);  Surgeon: [name], MD;  Location: [place] [facility];  Service: Open Heart Surgery;  Laterality: N/A;   TRANSCATHETER AORTIC VALVE REPLACEMENT, TRANSFEMORAL N/A 01/22/2019   Procedure: TRANSCATHETER AORTIC VALVE REPLACEMENT, TRANSFEMORAL;  Surgeon: [name], MD;  Location: [place] [facility];  Service: Open Heart Surgery;  Laterality: N/A;   TRICUSPID VALVE REPLACEMENT N/A 10/11/2012   Procedure: TRICUSPID VALVE REPAIR;   Surgeon: Rexene Alberts, MD;  Location: [place];  Service: Open Heart Surgery;  Laterality: N/A;   TUBAL LIGATION      Current Outpatient Medications  Medication Sig Dispense Refill   acetaminophen (TYLENOL) 500 MG tablet Take 1,000 mg by mouth every 6 (six) hours as needed for moderate pain or headache.     Calcium Carb-Cholecalciferol (CALCIUM 600 + D PO) Take 1 tablet by mouth 2 (two) times daily.     clopidogrel (PLAVIX) 75 MG tablet Take 1 tablet (75 mg total) by mouth daily. 90 tablet 3   dapagliflozin propanediol (FARXIGA) 10 MG TABS tablet Take 10 mg by mouth daily.     docusate sodium (COLACE) 100 MG capsule Take 100-200 mg by mouth daily as needed for mild constipation.     insulin detemir (LEVEMIR) 100 UNIT/ML injection 50 Units daily.     insulin regular (NOVOLIN R) 100 units/mL injection Inject 15 Units into the skin daily after supper.     magnesium hydroxide (MILK OF MAGNESIA) 400 MG/5ML suspension Take 30 mLs by mouth daily as needed for indigestion.      Multiple Vitamins-Minerals (MULTIVITAMIN WITH MINERALS) tablet Take 1 tablet by mouth daily.     omeprazole (PRILOSEC) 40 MG capsule Take 40 mg by mouth daily before breakfast.      Rivaroxaban (XARELTO) 15 MG TABS tablet Take 1 tablet (15 mg total) by mouth daily with supper. (Patient taking differently: Take 15 mg by mouth daily.) 30 tablet 11   venlafaxine XR (EFFEXOR-XR) 150 MG 24 hr capsule Take 150 mg by mouth daily with breakfast. Take with 75 mg capsule to equal 225 mg daily     venlafaxine XR (EFFEXOR-XR) 75 MG 24 hr capsule Take 75 mg by mouth daily with breakfast. Take with 150 mg capsule to equal 225 mg daily     vitamin B-12 (CYANOCOBALAMIN) 500 MCG tablet Take 500 mcg by mouth every Monday.     atorvastatin (LIPITOR) 80 MG tablet Take 1 tablet (80 mg total) by mouth daily. (Patient taking differently: Take 80 mg by mouth at bedtime.) 30 tablet 11   nitroGLYCERIN (NITROSTAT) 0.4 MG SL tablet Place 1 tablet (0.4 mg  total) under the tongue every 5 (five) minutes as needed for chest pain. 25 tablet 3   No current facility-administered medications for this visit.    Allergies  Allergen Reactions   Buspirone Hcl     lethargic/tired   Metformin And Related Diarrhea   Ozempic (0.25 Or 0.5 Mg-Dose) [Semaglutide(0.25 Or 0.5mg -Dos)]     Other reaction(s): nausea   Ultram [Tramadol] Nausea And Vomiting   Nickel Rash    Pt  unable to wear jewelry made of nickel.   Pneumococcal Vaccines Swelling and Rash    At injection set    Social History   Socioeconomic History   Marital status: Widowed    Spouse name: Not on file   Number of children: 3   Years of education: Not on file   Highest education level: High school graduate  Occupational History   Occupation: Air cabin crew  Tobacco Use   Smoking status: Unknown   Smokeless tobacco: Never  Vaping Use   Vaping Use: Never used  Substance and Sexual Activity   Alcohol use: No   Drug use: No   Sexual activity: Never  Other Topics Concern   Not on file  Social History Narrative   04/19/21 lives alone, dgtr lives close by   Social Determinants of Health   Financial Resource Strain: Not on file  Food Insecurity: Not on file  Transportation Needs: Not on file  Physical Activity: Not on file  Stress: Not on file  Social Connections: Not on file  Intimate Partner Violence: Not on file    Family History  Problem Relation Age of Onset   Alcohol abuse Mother    Cancer Father        prostate   Hypertension Father    Heart attack Paternal Grandfather    Stroke Neg Hx     Review of Systems:  As stated in the HPI and otherwise negative.   BP 130/60    Pulse 68    Ht 5\' 6"  (1.676 m)    Wt 147 lb 9.6 oz (67 kg)    SpO2 98%    BMI 23.82 kg/m   Physical Examination:  General: Well developed, well nourished, NAD  HEENT: OP clear, mucus membranes moist  SKIN: warm, dry. No rashes. Neuro: No focal deficits   Musculoskeletal: Muscle strength 5/5 all ext  Psychiatric: Mood and affect normal  Neck: No JVD, no carotid bruits, no thyromegaly, no lymphadenopathy.  Lungs:Clear bilaterally, no wheezes, rhonci, crackles Cardiovascular: Regular rate and rhythm. Soft systolic murmur.  Abdomen:Soft. Bowel sounds present. Non-tender.  Extremities: No lower extremity edema. Pulses are 2 + in the bilateral DP/PT.  Echo December 2020:   1. Left ventricular ejection fraction, by estimation, is 60 to 65%. The  left ventricle has normal function. The left ventricle has no regional  wall motion abnormalities. There is mild left ventricular hypertrophy.  Left ventricular diastolic parameters  are consistent with Grade II diastolic dysfunction (pseudonormalization).   2. Right ventricular systolic function is normal. The right ventricular  size is normal. Tricuspid regurgitation signal is inadequate for assessing  PA pressure.   3. Left atrial size was moderately dilated.   4. Status post tricuspid valve repair. Trivial tricuspid regurgitation.  No significant stenosis with mean gradient 2 mmHg.   5. Status post mitral valve repair. Trivial mitral regurgitation. Mean  gradient 4 mmHg, mild mitral stenosis.   6. Bioprosthetic aortic valve s/p TAVR (23 mm Edwards Sapien). Trivial  peri-valvular leakage. Mean gradient 16 mmHg, mildly elevated.   7. The inferior vena cava is normal in size with greater than 50%  respiratory variability, suggesting right atrial pressure of 3 mmHg.    EKG:  EKG is not ordered today. The ekg ordered today demonstrates   Recent Labs: 01/13/2021: Creatinine, Ser 0.90 04/19/2021: TSH 4.940   Lipid Panel    Component Value Date/Time   CHOL 112 01/23/2019 1530   TRIG 149 01/23/2019 1530  HDL 38 (L) 01/23/2019 1530   CHOLHDL 2.9 01/23/2019 1530   VLDL 30 01/23/2019 1530   LDLCALC 44 01/23/2019 1530     Wt Readings from Last 3 Encounters:  04/28/21 147 lb 9.6 oz (67 kg)   04/19/21 149 lb (67.6 kg)  01/22/21 138 lb (62.6 kg)     Other studies Reviewed: Additional studies/ records that were reviewed today include: . Review of the above records demonstrates:    Assessment and Plan:   1. Severe Aortic stenosis s/p TAVR: She is doing well post TAVR. Trivial PVL by echo November 2021. She is on Xarelto.    2. Mitral regurgitation: She is s/p MV ring in 2014, stable by echo 2021  3. Tricuspid regurgitation: She is s/p TV ring in 2014. Stable by echo in 2021  4. CAD without angina: Mild CAD by cath in 2020. No chest pain. Continue statin  5. HTN: BP is controlled. No changes  6. Atrial fibrillation, paroxysmal: She is in sinus rhythm today on exam. Will continue Xarelto.     7. Palpitations: Cardiac monitor March 2021 with sinus with PACs.     Current medicines are reviewed at length with the patient today.  The patient does not have concerns regarding medicines.  The following changes have been made:  no change  Labs/ tests ordered today include:   No orders of the defined types were placed in this encounter.   Disposition:   F/U with me one year  Signed, Lauree Chandler, MD 04/28/2021 9:42 AM    [facility] [place], [place], [place]  [address] Phone: [phone]; Fax: [phone]

## 2021-04-28 NOTE — Patient Instructions (Signed)
Medication Instructions:  Your physician recommends that you continue on your current medications as directed. Please refer to the Current Medication list given to you today.   *If you need a refill on your cardiac medications before your next appointment, please call your pharmacy*   Lab Work: None ordered   If you have labs (blood work) drawn today and your tests are completely normal, you will receive your results only by: [place] (if you have MyChart) OR A paper copy in the mail If you have any lab test that is abnormal or we need to change your treatment, we will call you to review the results.   Testing/Procedures: None ordered    Follow-Up: At [facility], you and your health needs are our priority.  As part of our continuing mission to provide you with exceptional heart care, we have created designated Provider Care Teams.  These Care Teams include your primary Cardiologist (physician) and Advanced Practice Providers (APPs -  Physician Assistants and Nurse Practitioners) who all work together to provide you with the care you need, when you need it.  We recommend signing up for the patient portal called "MyChart".  Sign up information is provided on this After Visit Summary.  MyChart is used to connect with patients for Virtual Visits (Telemedicine).  Patients are able to view lab/test results, encounter notes, upcoming appointments, etc.  Non-urgent messages can be sent to your provider as well.   To learn more about what you can do with MyChart, go to [email or web address].    Your next appointment:   12 month(s)  The format for your next appointment:   In Person  Provider:   Lauree Chandler, MD     Other Instructions None

## 2021-04-29 ENCOUNTER — Ambulatory Visit
Admission: RE | Admit: 2021-04-29 | Discharge: 2021-04-29 | Disposition: A | Discharge status: Home / Self Care | Payer: Medicare Other | Source: Ambulatory Visit | Attending: Geriatric Medicine | Admitting: Geriatric Medicine

## 2021-04-29 DIAGNOSIS — R921 Mammographic calcification found on diagnostic imaging of breast: Secondary | ICD-10-CM

## 2021-04-29 DIAGNOSIS — N6011 Diffuse cystic mastopathy of right breast: Secondary | ICD-10-CM | POA: Diagnosis not present

## 2021-04-30 ENCOUNTER — Encounter: Payer: Self-pay | Admitting: Psychiatry

## 2021-05-03 ENCOUNTER — Telehealth: Payer: Self-pay | Admitting: Psychiatry

## 2021-05-03 ENCOUNTER — Other Ambulatory Visit: Payer: Self-pay | Admitting: Psychiatry

## 2021-05-03 DIAGNOSIS — F0390 Unspecified dementia without behavioral disturbance: Secondary | ICD-10-CM

## 2021-05-03 NOTE — Telephone Encounter (Signed)
[place] messaged me back stating they can see her on 05/06/21

## 2021-05-03 NOTE — Telephone Encounter (Signed)
[place] [facility] a message if she can take the patient.

## 2021-05-04 DIAGNOSIS — E1165 Type 2 diabetes mellitus with hyperglycemia: Secondary | ICD-10-CM | POA: Diagnosis not present

## 2021-05-06 DIAGNOSIS — Z9181 History of falling: Secondary | ICD-10-CM | POA: Diagnosis not present

## 2021-05-06 DIAGNOSIS — F0393 Unspecified dementia, unspecified severity, with mood disturbance: Secondary | ICD-10-CM | POA: Diagnosis not present

## 2021-05-06 DIAGNOSIS — E1122 Type 2 diabetes mellitus with diabetic chronic kidney disease: Secondary | ICD-10-CM | POA: Diagnosis not present

## 2021-05-06 DIAGNOSIS — I69398 Other sequelae of cerebral infarction: Secondary | ICD-10-CM | POA: Diagnosis not present

## 2021-05-06 DIAGNOSIS — I2510 Atherosclerotic heart disease of native coronary artery without angina pectoris: Secondary | ICD-10-CM | POA: Diagnosis not present

## 2021-05-06 DIAGNOSIS — I4891 Unspecified atrial fibrillation: Secondary | ICD-10-CM | POA: Diagnosis not present

## 2021-05-06 DIAGNOSIS — Z7901 Long term (current) use of anticoagulants: Secondary | ICD-10-CM | POA: Diagnosis not present

## 2021-05-06 DIAGNOSIS — Z86718 Personal history of other venous thrombosis and embolism: Secondary | ICD-10-CM | POA: Diagnosis not present

## 2021-05-06 DIAGNOSIS — N183 Chronic kidney disease, stage 3 unspecified: Secondary | ICD-10-CM | POA: Diagnosis not present

## 2021-05-06 DIAGNOSIS — Z7902 Long term (current) use of antithrombotics/antiplatelets: Secondary | ICD-10-CM | POA: Diagnosis not present

## 2021-05-06 DIAGNOSIS — Z794 Long term (current) use of insulin: Secondary | ICD-10-CM | POA: Diagnosis not present

## 2021-05-06 DIAGNOSIS — E7800 Pure hypercholesterolemia, unspecified: Secondary | ICD-10-CM | POA: Diagnosis not present

## 2021-05-06 DIAGNOSIS — F0394 Unspecified dementia, unspecified severity, with anxiety: Secondary | ICD-10-CM | POA: Diagnosis not present

## 2021-05-06 DIAGNOSIS — K219 Gastro-esophageal reflux disease without esophagitis: Secondary | ICD-10-CM | POA: Diagnosis not present

## 2021-05-06 DIAGNOSIS — M170 Bilateral primary osteoarthritis of knee: Secondary | ICD-10-CM | POA: Diagnosis not present

## 2021-05-06 DIAGNOSIS — F32A Depression, unspecified: Secondary | ICD-10-CM | POA: Diagnosis not present

## 2021-05-06 DIAGNOSIS — H539 Unspecified visual disturbance: Secondary | ICD-10-CM | POA: Diagnosis not present

## 2021-05-06 DIAGNOSIS — I480 Paroxysmal atrial fibrillation: Secondary | ICD-10-CM | POA: Diagnosis not present

## 2021-05-06 DIAGNOSIS — H9193 Unspecified hearing loss, bilateral: Secondary | ICD-10-CM | POA: Diagnosis not present

## 2021-05-06 DIAGNOSIS — I129 Hypertensive chronic kidney disease with stage 1 through stage 4 chronic kidney disease, or unspecified chronic kidney disease: Secondary | ICD-10-CM | POA: Diagnosis not present

## 2021-05-06 DIAGNOSIS — I083 Combined rheumatic disorders of mitral, aortic and tricuspid valves: Secondary | ICD-10-CM | POA: Diagnosis not present

## 2021-05-06 DIAGNOSIS — E041 Nontoxic single thyroid nodule: Secondary | ICD-10-CM | POA: Diagnosis not present

## 2021-05-06 DIAGNOSIS — Z7984 Long term (current) use of oral hypoglycemic drugs: Secondary | ICD-10-CM | POA: Diagnosis not present

## 2021-05-19 DIAGNOSIS — Z7901 Long term (current) use of anticoagulants: Secondary | ICD-10-CM | POA: Diagnosis not present

## 2021-05-19 DIAGNOSIS — M170 Bilateral primary osteoarthritis of knee: Secondary | ICD-10-CM | POA: Diagnosis not present

## 2021-05-19 DIAGNOSIS — H9193 Unspecified hearing loss, bilateral: Secondary | ICD-10-CM | POA: Diagnosis not present

## 2021-05-19 DIAGNOSIS — Z7902 Long term (current) use of antithrombotics/antiplatelets: Secondary | ICD-10-CM | POA: Diagnosis not present

## 2021-05-19 DIAGNOSIS — Z9181 History of falling: Secondary | ICD-10-CM | POA: Diagnosis not present

## 2021-05-19 DIAGNOSIS — I4891 Unspecified atrial fibrillation: Secondary | ICD-10-CM | POA: Diagnosis not present

## 2021-05-19 DIAGNOSIS — I69398 Other sequelae of cerebral infarction: Secondary | ICD-10-CM | POA: Diagnosis not present

## 2021-05-19 DIAGNOSIS — H539 Unspecified visual disturbance: Secondary | ICD-10-CM | POA: Diagnosis not present

## 2021-05-19 DIAGNOSIS — N183 Chronic kidney disease, stage 3 unspecified: Secondary | ICD-10-CM | POA: Diagnosis not present

## 2021-05-19 DIAGNOSIS — E7800 Pure hypercholesterolemia, unspecified: Secondary | ICD-10-CM | POA: Diagnosis not present

## 2021-05-19 DIAGNOSIS — F0394 Unspecified dementia, unspecified severity, with anxiety: Secondary | ICD-10-CM | POA: Diagnosis not present

## 2021-05-19 DIAGNOSIS — Z7984 Long term (current) use of oral hypoglycemic drugs: Secondary | ICD-10-CM | POA: Diagnosis not present

## 2021-05-19 DIAGNOSIS — I2510 Atherosclerotic heart disease of native coronary artery without angina pectoris: Secondary | ICD-10-CM | POA: Diagnosis not present

## 2021-05-19 DIAGNOSIS — I129 Hypertensive chronic kidney disease with stage 1 through stage 4 chronic kidney disease, or unspecified chronic kidney disease: Secondary | ICD-10-CM | POA: Diagnosis not present

## 2021-05-19 DIAGNOSIS — F0393 Unspecified dementia, unspecified severity, with mood disturbance: Secondary | ICD-10-CM | POA: Diagnosis not present

## 2021-05-19 DIAGNOSIS — Z794 Long term (current) use of insulin: Secondary | ICD-10-CM | POA: Diagnosis not present

## 2021-05-19 DIAGNOSIS — I083 Combined rheumatic disorders of mitral, aortic and tricuspid valves: Secondary | ICD-10-CM | POA: Diagnosis not present

## 2021-05-19 DIAGNOSIS — K219 Gastro-esophageal reflux disease without esophagitis: Secondary | ICD-10-CM | POA: Diagnosis not present

## 2021-05-19 DIAGNOSIS — E041 Nontoxic single thyroid nodule: Secondary | ICD-10-CM | POA: Diagnosis not present

## 2021-05-19 DIAGNOSIS — I480 Paroxysmal atrial fibrillation: Secondary | ICD-10-CM | POA: Diagnosis not present

## 2021-05-19 DIAGNOSIS — Z86718 Personal history of other venous thrombosis and embolism: Secondary | ICD-10-CM | POA: Diagnosis not present

## 2021-05-19 DIAGNOSIS — F32A Depression, unspecified: Secondary | ICD-10-CM | POA: Diagnosis not present

## 2021-05-19 DIAGNOSIS — E1122 Type 2 diabetes mellitus with diabetic chronic kidney disease: Secondary | ICD-10-CM | POA: Diagnosis not present

## 2021-05-24 ENCOUNTER — Telehealth: Payer: Self-pay | Admitting: Psychiatry

## 2021-05-24 DIAGNOSIS — F32A Depression, unspecified: Secondary | ICD-10-CM | POA: Diagnosis not present

## 2021-05-24 DIAGNOSIS — F0393 Unspecified dementia, unspecified severity, with mood disturbance: Secondary | ICD-10-CM | POA: Diagnosis not present

## 2021-05-24 DIAGNOSIS — E7800 Pure hypercholesterolemia, unspecified: Secondary | ICD-10-CM | POA: Diagnosis not present

## 2021-05-24 DIAGNOSIS — Z794 Long term (current) use of insulin: Secondary | ICD-10-CM | POA: Diagnosis not present

## 2021-05-24 DIAGNOSIS — I083 Combined rheumatic disorders of mitral, aortic and tricuspid valves: Secondary | ICD-10-CM | POA: Diagnosis not present

## 2021-05-24 DIAGNOSIS — Z7984 Long term (current) use of oral hypoglycemic drugs: Secondary | ICD-10-CM | POA: Diagnosis not present

## 2021-05-24 DIAGNOSIS — H9193 Unspecified hearing loss, bilateral: Secondary | ICD-10-CM | POA: Diagnosis not present

## 2021-05-24 DIAGNOSIS — Z7901 Long term (current) use of anticoagulants: Secondary | ICD-10-CM | POA: Diagnosis not present

## 2021-05-24 DIAGNOSIS — I129 Hypertensive chronic kidney disease with stage 1 through stage 4 chronic kidney disease, or unspecified chronic kidney disease: Secondary | ICD-10-CM | POA: Diagnosis not present

## 2021-05-24 DIAGNOSIS — I69398 Other sequelae of cerebral infarction: Secondary | ICD-10-CM | POA: Diagnosis not present

## 2021-05-24 DIAGNOSIS — Z9181 History of falling: Secondary | ICD-10-CM | POA: Diagnosis not present

## 2021-05-24 DIAGNOSIS — I2510 Atherosclerotic heart disease of native coronary artery without angina pectoris: Secondary | ICD-10-CM | POA: Diagnosis not present

## 2021-05-24 DIAGNOSIS — F0394 Unspecified dementia, unspecified severity, with anxiety: Secondary | ICD-10-CM | POA: Diagnosis not present

## 2021-05-24 DIAGNOSIS — K219 Gastro-esophageal reflux disease without esophagitis: Secondary | ICD-10-CM | POA: Diagnosis not present

## 2021-05-24 DIAGNOSIS — I4891 Unspecified atrial fibrillation: Secondary | ICD-10-CM | POA: Diagnosis not present

## 2021-05-24 DIAGNOSIS — N183 Chronic kidney disease, stage 3 unspecified: Secondary | ICD-10-CM | POA: Diagnosis not present

## 2021-05-24 DIAGNOSIS — E1122 Type 2 diabetes mellitus with diabetic chronic kidney disease: Secondary | ICD-10-CM | POA: Diagnosis not present

## 2021-05-24 DIAGNOSIS — Z7902 Long term (current) use of antithrombotics/antiplatelets: Secondary | ICD-10-CM | POA: Diagnosis not present

## 2021-05-24 DIAGNOSIS — H539 Unspecified visual disturbance: Secondary | ICD-10-CM | POA: Diagnosis not present

## 2021-05-24 DIAGNOSIS — I480 Paroxysmal atrial fibrillation: Secondary | ICD-10-CM | POA: Diagnosis not present

## 2021-05-24 DIAGNOSIS — E041 Nontoxic single thyroid nodule: Secondary | ICD-10-CM | POA: Diagnosis not present

## 2021-05-24 DIAGNOSIS — M170 Bilateral primary osteoarthritis of knee: Secondary | ICD-10-CM | POA: Diagnosis not present

## 2021-05-24 DIAGNOSIS — Z86718 Personal history of other venous thrombosis and embolism: Secondary | ICD-10-CM | POA: Diagnosis not present

## 2021-05-24 NOTE — Telephone Encounter (Signed)
[facility] [place] @ [facility] is asking verbal orders for Speech Therapy for 1 week 6 vm secure @ [phone]

## 2021-05-24 NOTE — Telephone Encounter (Signed)
I called Shelda Pal back and provided the verbal order for ST. Advised to call back if she needed anything further.

## 2021-06-03 DIAGNOSIS — I083 Combined rheumatic disorders of mitral, aortic and tricuspid valves: Secondary | ICD-10-CM | POA: Diagnosis not present

## 2021-06-03 DIAGNOSIS — Z7902 Long term (current) use of antithrombotics/antiplatelets: Secondary | ICD-10-CM | POA: Diagnosis not present

## 2021-06-03 DIAGNOSIS — I69398 Other sequelae of cerebral infarction: Secondary | ICD-10-CM | POA: Diagnosis not present

## 2021-06-03 DIAGNOSIS — N183 Chronic kidney disease, stage 3 unspecified: Secondary | ICD-10-CM | POA: Diagnosis not present

## 2021-06-03 DIAGNOSIS — H539 Unspecified visual disturbance: Secondary | ICD-10-CM | POA: Diagnosis not present

## 2021-06-03 DIAGNOSIS — Z794 Long term (current) use of insulin: Secondary | ICD-10-CM | POA: Diagnosis not present

## 2021-06-03 DIAGNOSIS — I2510 Atherosclerotic heart disease of native coronary artery without angina pectoris: Secondary | ICD-10-CM | POA: Diagnosis not present

## 2021-06-03 DIAGNOSIS — Z7901 Long term (current) use of anticoagulants: Secondary | ICD-10-CM | POA: Diagnosis not present

## 2021-06-03 DIAGNOSIS — F0394 Unspecified dementia, unspecified severity, with anxiety: Secondary | ICD-10-CM | POA: Diagnosis not present

## 2021-06-03 DIAGNOSIS — H9193 Unspecified hearing loss, bilateral: Secondary | ICD-10-CM | POA: Diagnosis not present

## 2021-06-03 DIAGNOSIS — Z7984 Long term (current) use of oral hypoglycemic drugs: Secondary | ICD-10-CM | POA: Diagnosis not present

## 2021-06-03 DIAGNOSIS — I129 Hypertensive chronic kidney disease with stage 1 through stage 4 chronic kidney disease, or unspecified chronic kidney disease: Secondary | ICD-10-CM | POA: Diagnosis not present

## 2021-06-03 DIAGNOSIS — F0393 Unspecified dementia, unspecified severity, with mood disturbance: Secondary | ICD-10-CM | POA: Diagnosis not present

## 2021-06-03 DIAGNOSIS — E041 Nontoxic single thyroid nodule: Secondary | ICD-10-CM | POA: Diagnosis not present

## 2021-06-03 DIAGNOSIS — F32A Depression, unspecified: Secondary | ICD-10-CM | POA: Diagnosis not present

## 2021-06-03 DIAGNOSIS — I4891 Unspecified atrial fibrillation: Secondary | ICD-10-CM | POA: Diagnosis not present

## 2021-06-03 DIAGNOSIS — E7800 Pure hypercholesterolemia, unspecified: Secondary | ICD-10-CM | POA: Diagnosis not present

## 2021-06-03 DIAGNOSIS — E1122 Type 2 diabetes mellitus with diabetic chronic kidney disease: Secondary | ICD-10-CM | POA: Diagnosis not present

## 2021-06-03 DIAGNOSIS — I480 Paroxysmal atrial fibrillation: Secondary | ICD-10-CM | POA: Diagnosis not present

## 2021-06-03 DIAGNOSIS — M170 Bilateral primary osteoarthritis of knee: Secondary | ICD-10-CM | POA: Diagnosis not present

## 2021-06-03 DIAGNOSIS — Z86718 Personal history of other venous thrombosis and embolism: Secondary | ICD-10-CM | POA: Diagnosis not present

## 2021-06-03 DIAGNOSIS — K219 Gastro-esophageal reflux disease without esophagitis: Secondary | ICD-10-CM | POA: Diagnosis not present

## 2021-06-03 DIAGNOSIS — Z9181 History of falling: Secondary | ICD-10-CM | POA: Diagnosis not present

## 2021-06-11 DIAGNOSIS — E1165 Type 2 diabetes mellitus with hyperglycemia: Secondary | ICD-10-CM | POA: Diagnosis not present

## 2021-06-15 DIAGNOSIS — Z7984 Long term (current) use of oral hypoglycemic drugs: Secondary | ICD-10-CM | POA: Diagnosis not present

## 2021-06-15 DIAGNOSIS — E041 Nontoxic single thyroid nodule: Secondary | ICD-10-CM | POA: Diagnosis not present

## 2021-06-15 DIAGNOSIS — Z794 Long term (current) use of insulin: Secondary | ICD-10-CM | POA: Diagnosis not present

## 2021-06-15 DIAGNOSIS — F0393 Unspecified dementia, unspecified severity, with mood disturbance: Secondary | ICD-10-CM | POA: Diagnosis not present

## 2021-06-15 DIAGNOSIS — F32A Depression, unspecified: Secondary | ICD-10-CM | POA: Diagnosis not present

## 2021-06-15 DIAGNOSIS — I480 Paroxysmal atrial fibrillation: Secondary | ICD-10-CM | POA: Diagnosis not present

## 2021-06-15 DIAGNOSIS — Z7902 Long term (current) use of antithrombotics/antiplatelets: Secondary | ICD-10-CM | POA: Diagnosis not present

## 2021-06-15 DIAGNOSIS — I129 Hypertensive chronic kidney disease with stage 1 through stage 4 chronic kidney disease, or unspecified chronic kidney disease: Secondary | ICD-10-CM | POA: Diagnosis not present

## 2021-06-15 DIAGNOSIS — I2510 Atherosclerotic heart disease of native coronary artery without angina pectoris: Secondary | ICD-10-CM | POA: Diagnosis not present

## 2021-06-15 DIAGNOSIS — N183 Chronic kidney disease, stage 3 unspecified: Secondary | ICD-10-CM | POA: Diagnosis not present

## 2021-06-15 DIAGNOSIS — Z86718 Personal history of other venous thrombosis and embolism: Secondary | ICD-10-CM | POA: Diagnosis not present

## 2021-06-15 DIAGNOSIS — E1122 Type 2 diabetes mellitus with diabetic chronic kidney disease: Secondary | ICD-10-CM | POA: Diagnosis not present

## 2021-06-15 DIAGNOSIS — K219 Gastro-esophageal reflux disease without esophagitis: Secondary | ICD-10-CM | POA: Diagnosis not present

## 2021-06-15 DIAGNOSIS — Z7901 Long term (current) use of anticoagulants: Secondary | ICD-10-CM | POA: Diagnosis not present

## 2021-06-15 DIAGNOSIS — M170 Bilateral primary osteoarthritis of knee: Secondary | ICD-10-CM | POA: Diagnosis not present

## 2021-06-15 DIAGNOSIS — I083 Combined rheumatic disorders of mitral, aortic and tricuspid valves: Secondary | ICD-10-CM | POA: Diagnosis not present

## 2021-06-15 DIAGNOSIS — E7800 Pure hypercholesterolemia, unspecified: Secondary | ICD-10-CM | POA: Diagnosis not present

## 2021-06-15 DIAGNOSIS — H9193 Unspecified hearing loss, bilateral: Secondary | ICD-10-CM | POA: Diagnosis not present

## 2021-06-15 DIAGNOSIS — F0394 Unspecified dementia, unspecified severity, with anxiety: Secondary | ICD-10-CM | POA: Diagnosis not present

## 2021-06-15 DIAGNOSIS — H539 Unspecified visual disturbance: Secondary | ICD-10-CM | POA: Diagnosis not present

## 2021-06-15 DIAGNOSIS — Z9181 History of falling: Secondary | ICD-10-CM | POA: Diagnosis not present

## 2021-06-15 DIAGNOSIS — I4891 Unspecified atrial fibrillation: Secondary | ICD-10-CM | POA: Diagnosis not present

## 2021-06-15 DIAGNOSIS — I69398 Other sequelae of cerebral infarction: Secondary | ICD-10-CM | POA: Diagnosis not present

## 2021-06-16 DIAGNOSIS — E7800 Pure hypercholesterolemia, unspecified: Secondary | ICD-10-CM | POA: Diagnosis not present

## 2021-06-16 DIAGNOSIS — E1121 Type 2 diabetes mellitus with diabetic nephropathy: Secondary | ICD-10-CM | POA: Diagnosis not present

## 2021-06-25 DIAGNOSIS — I480 Paroxysmal atrial fibrillation: Secondary | ICD-10-CM | POA: Diagnosis not present

## 2021-06-25 DIAGNOSIS — E7800 Pure hypercholesterolemia, unspecified: Secondary | ICD-10-CM | POA: Diagnosis not present

## 2021-06-25 DIAGNOSIS — Z9181 History of falling: Secondary | ICD-10-CM | POA: Diagnosis not present

## 2021-06-25 DIAGNOSIS — Z7984 Long term (current) use of oral hypoglycemic drugs: Secondary | ICD-10-CM | POA: Diagnosis not present

## 2021-06-25 DIAGNOSIS — Z86718 Personal history of other venous thrombosis and embolism: Secondary | ICD-10-CM | POA: Diagnosis not present

## 2021-06-25 DIAGNOSIS — F32A Depression, unspecified: Secondary | ICD-10-CM | POA: Diagnosis not present

## 2021-06-25 DIAGNOSIS — E1122 Type 2 diabetes mellitus with diabetic chronic kidney disease: Secondary | ICD-10-CM | POA: Diagnosis not present

## 2021-06-25 DIAGNOSIS — E041 Nontoxic single thyroid nodule: Secondary | ICD-10-CM | POA: Diagnosis not present

## 2021-06-25 DIAGNOSIS — H9193 Unspecified hearing loss, bilateral: Secondary | ICD-10-CM | POA: Diagnosis not present

## 2021-06-25 DIAGNOSIS — I129 Hypertensive chronic kidney disease with stage 1 through stage 4 chronic kidney disease, or unspecified chronic kidney disease: Secondary | ICD-10-CM | POA: Diagnosis not present

## 2021-06-25 DIAGNOSIS — N183 Chronic kidney disease, stage 3 unspecified: Secondary | ICD-10-CM | POA: Diagnosis not present

## 2021-06-25 DIAGNOSIS — I69398 Other sequelae of cerebral infarction: Secondary | ICD-10-CM | POA: Diagnosis not present

## 2021-06-25 DIAGNOSIS — I083 Combined rheumatic disorders of mitral, aortic and tricuspid valves: Secondary | ICD-10-CM | POA: Diagnosis not present

## 2021-06-25 DIAGNOSIS — I4891 Unspecified atrial fibrillation: Secondary | ICD-10-CM | POA: Diagnosis not present

## 2021-06-25 DIAGNOSIS — F0394 Unspecified dementia, unspecified severity, with anxiety: Secondary | ICD-10-CM | POA: Diagnosis not present

## 2021-06-25 DIAGNOSIS — K219 Gastro-esophageal reflux disease without esophagitis: Secondary | ICD-10-CM | POA: Diagnosis not present

## 2021-06-25 DIAGNOSIS — I2510 Atherosclerotic heart disease of native coronary artery without angina pectoris: Secondary | ICD-10-CM | POA: Diagnosis not present

## 2021-06-25 DIAGNOSIS — M170 Bilateral primary osteoarthritis of knee: Secondary | ICD-10-CM | POA: Diagnosis not present

## 2021-06-25 DIAGNOSIS — H539 Unspecified visual disturbance: Secondary | ICD-10-CM | POA: Diagnosis not present

## 2021-06-25 DIAGNOSIS — Z794 Long term (current) use of insulin: Secondary | ICD-10-CM | POA: Diagnosis not present

## 2021-06-25 DIAGNOSIS — Z7902 Long term (current) use of antithrombotics/antiplatelets: Secondary | ICD-10-CM | POA: Diagnosis not present

## 2021-06-25 DIAGNOSIS — F0393 Unspecified dementia, unspecified severity, with mood disturbance: Secondary | ICD-10-CM | POA: Diagnosis not present

## 2021-06-25 DIAGNOSIS — Z7901 Long term (current) use of anticoagulants: Secondary | ICD-10-CM | POA: Diagnosis not present

## 2021-07-01 DIAGNOSIS — I129 Hypertensive chronic kidney disease with stage 1 through stage 4 chronic kidney disease, or unspecified chronic kidney disease: Secondary | ICD-10-CM | POA: Diagnosis not present

## 2021-07-01 DIAGNOSIS — Z7901 Long term (current) use of anticoagulants: Secondary | ICD-10-CM | POA: Diagnosis not present

## 2021-07-01 DIAGNOSIS — F0393 Unspecified dementia, unspecified severity, with mood disturbance: Secondary | ICD-10-CM | POA: Diagnosis not present

## 2021-07-01 DIAGNOSIS — I083 Combined rheumatic disorders of mitral, aortic and tricuspid valves: Secondary | ICD-10-CM | POA: Diagnosis not present

## 2021-07-01 DIAGNOSIS — H539 Unspecified visual disturbance: Secondary | ICD-10-CM | POA: Diagnosis not present

## 2021-07-01 DIAGNOSIS — H9193 Unspecified hearing loss, bilateral: Secondary | ICD-10-CM | POA: Diagnosis not present

## 2021-07-01 DIAGNOSIS — Z7902 Long term (current) use of antithrombotics/antiplatelets: Secondary | ICD-10-CM | POA: Diagnosis not present

## 2021-07-01 DIAGNOSIS — Z9181 History of falling: Secondary | ICD-10-CM | POA: Diagnosis not present

## 2021-07-01 DIAGNOSIS — I480 Paroxysmal atrial fibrillation: Secondary | ICD-10-CM | POA: Diagnosis not present

## 2021-07-01 DIAGNOSIS — I2510 Atherosclerotic heart disease of native coronary artery without angina pectoris: Secondary | ICD-10-CM | POA: Diagnosis not present

## 2021-07-01 DIAGNOSIS — F0394 Unspecified dementia, unspecified severity, with anxiety: Secondary | ICD-10-CM | POA: Diagnosis not present

## 2021-07-01 DIAGNOSIS — I69398 Other sequelae of cerebral infarction: Secondary | ICD-10-CM | POA: Diagnosis not present

## 2021-07-01 DIAGNOSIS — N183 Chronic kidney disease, stage 3 unspecified: Secondary | ICD-10-CM | POA: Diagnosis not present

## 2021-07-01 DIAGNOSIS — Z7984 Long term (current) use of oral hypoglycemic drugs: Secondary | ICD-10-CM | POA: Diagnosis not present

## 2021-07-01 DIAGNOSIS — E7800 Pure hypercholesterolemia, unspecified: Secondary | ICD-10-CM | POA: Diagnosis not present

## 2021-07-01 DIAGNOSIS — K219 Gastro-esophageal reflux disease without esophagitis: Secondary | ICD-10-CM | POA: Diagnosis not present

## 2021-07-01 DIAGNOSIS — Z86718 Personal history of other venous thrombosis and embolism: Secondary | ICD-10-CM | POA: Diagnosis not present

## 2021-07-01 DIAGNOSIS — F32A Depression, unspecified: Secondary | ICD-10-CM | POA: Diagnosis not present

## 2021-07-01 DIAGNOSIS — E041 Nontoxic single thyroid nodule: Secondary | ICD-10-CM | POA: Diagnosis not present

## 2021-07-01 DIAGNOSIS — Z794 Long term (current) use of insulin: Secondary | ICD-10-CM | POA: Diagnosis not present

## 2021-07-01 DIAGNOSIS — I4891 Unspecified atrial fibrillation: Secondary | ICD-10-CM | POA: Diagnosis not present

## 2021-07-01 DIAGNOSIS — M170 Bilateral primary osteoarthritis of knee: Secondary | ICD-10-CM | POA: Diagnosis not present

## 2021-07-01 DIAGNOSIS — E1122 Type 2 diabetes mellitus with diabetic chronic kidney disease: Secondary | ICD-10-CM | POA: Diagnosis not present

## 2021-07-11 DIAGNOSIS — E1165 Type 2 diabetes mellitus with hyperglycemia: Secondary | ICD-10-CM | POA: Diagnosis not present

## 2021-07-28 ENCOUNTER — Other Ambulatory Visit: Payer: Self-pay | Admitting: Cardiovascular Disease

## 2021-08-10 DIAGNOSIS — E1165 Type 2 diabetes mellitus with hyperglycemia: Secondary | ICD-10-CM | POA: Diagnosis not present

## 2021-08-17 ENCOUNTER — Telehealth (HOSPITAL_COMMUNITY): Payer: Self-pay

## 2021-08-17 NOTE — Telephone Encounter (Signed)
Called to schedule cta head/neck, no answer, left vm. AW 

## 2021-08-20 DIAGNOSIS — G8929 Other chronic pain: Secondary | ICD-10-CM | POA: Diagnosis not present

## 2021-08-20 DIAGNOSIS — N1831 Chronic kidney disease, stage 3a: Secondary | ICD-10-CM | POA: Diagnosis not present

## 2021-08-20 DIAGNOSIS — E7800 Pure hypercholesterolemia, unspecified: Secondary | ICD-10-CM | POA: Diagnosis not present

## 2021-08-20 DIAGNOSIS — E1121 Type 2 diabetes mellitus with diabetic nephropathy: Secondary | ICD-10-CM | POA: Diagnosis not present

## 2021-08-25 DIAGNOSIS — D6869 Other thrombophilia: Secondary | ICD-10-CM | POA: Diagnosis not present

## 2021-08-25 DIAGNOSIS — M25561 Pain in right knee: Secondary | ICD-10-CM | POA: Diagnosis not present

## 2021-08-25 DIAGNOSIS — E7800 Pure hypercholesterolemia, unspecified: Secondary | ICD-10-CM | POA: Diagnosis not present

## 2021-08-25 DIAGNOSIS — N1831 Chronic kidney disease, stage 3a: Secondary | ICD-10-CM | POA: Diagnosis not present

## 2021-08-25 DIAGNOSIS — K9089 Other intestinal malabsorption: Secondary | ICD-10-CM | POA: Diagnosis not present

## 2021-08-25 DIAGNOSIS — I129 Hypertensive chronic kidney disease with stage 1 through stage 4 chronic kidney disease, or unspecified chronic kidney disease: Secondary | ICD-10-CM | POA: Diagnosis not present

## 2021-08-25 DIAGNOSIS — E1121 Type 2 diabetes mellitus with diabetic nephropathy: Secondary | ICD-10-CM | POA: Diagnosis not present

## 2021-08-25 DIAGNOSIS — I480 Paroxysmal atrial fibrillation: Secondary | ICD-10-CM | POA: Diagnosis not present

## 2021-08-25 DIAGNOSIS — G8929 Other chronic pain: Secondary | ICD-10-CM | POA: Diagnosis not present

## 2021-08-25 DIAGNOSIS — Z79899 Other long term (current) drug therapy: Secondary | ICD-10-CM | POA: Diagnosis not present

## 2021-08-25 DIAGNOSIS — Z0000 Encounter for general adult medical examination without abnormal findings: Secondary | ICD-10-CM | POA: Diagnosis not present

## 2021-09-01 DIAGNOSIS — K921 Melena: Secondary | ICD-10-CM | POA: Diagnosis not present

## 2021-09-06 DIAGNOSIS — K921 Melena: Secondary | ICD-10-CM | POA: Diagnosis not present

## 2021-09-17 DIAGNOSIS — E1165 Type 2 diabetes mellitus with hyperglycemia: Secondary | ICD-10-CM | POA: Diagnosis not present

## 2021-09-20 DIAGNOSIS — M25561 Pain in right knee: Secondary | ICD-10-CM | POA: Diagnosis not present

## 2021-10-05 ENCOUNTER — Other Ambulatory Visit: Payer: Self-pay | Admitting: Physician Assistant

## 2021-10-16 ENCOUNTER — Other Ambulatory Visit: Payer: Self-pay

## 2021-10-16 ENCOUNTER — Emergency Department (HOSPITAL_COMMUNITY): Payer: Medicare Other

## 2021-10-16 ENCOUNTER — Inpatient Hospital Stay (HOSPITAL_COMMUNITY)
Admission: EM | Admit: 2021-10-16 | Discharge: 2021-10-20 | DRG: 378 | Disposition: A | Discharge status: Home Health | Payer: Medicare Other | Attending: Student | Admitting: Student

## 2021-10-16 DIAGNOSIS — K5731 Diverticulosis of large intestine without perforation or abscess with bleeding: Secondary | ICD-10-CM | POA: Diagnosis not present

## 2021-10-16 DIAGNOSIS — Z961 Presence of intraocular lens: Secondary | ICD-10-CM | POA: Diagnosis present

## 2021-10-16 DIAGNOSIS — Z8249 Family history of ischemic heart disease and other diseases of the circulatory system: Secondary | ICD-10-CM | POA: Diagnosis not present

## 2021-10-16 DIAGNOSIS — F419 Anxiety disorder, unspecified: Secondary | ICD-10-CM | POA: Diagnosis present

## 2021-10-16 DIAGNOSIS — Z7901 Long term (current) use of anticoagulants: Secondary | ICD-10-CM | POA: Diagnosis not present

## 2021-10-16 DIAGNOSIS — E118 Type 2 diabetes mellitus with unspecified complications: Secondary | ICD-10-CM

## 2021-10-16 DIAGNOSIS — I130 Hypertensive heart and chronic kidney disease with heart failure and stage 1 through stage 4 chronic kidney disease, or unspecified chronic kidney disease: Secondary | ICD-10-CM | POA: Diagnosis present

## 2021-10-16 DIAGNOSIS — N281 Cyst of kidney, acquired: Secondary | ICD-10-CM | POA: Diagnosis not present

## 2021-10-16 DIAGNOSIS — F418 Other specified anxiety disorders: Secondary | ICD-10-CM | POA: Diagnosis present

## 2021-10-16 DIAGNOSIS — Z7902 Long term (current) use of antithrombotics/antiplatelets: Secondary | ICD-10-CM

## 2021-10-16 DIAGNOSIS — K625 Hemorrhage of anus and rectum: Secondary | ICD-10-CM | POA: Diagnosis not present

## 2021-10-16 DIAGNOSIS — N958 Other specified menopausal and perimenopausal disorders: Secondary | ICD-10-CM | POA: Diagnosis not present

## 2021-10-16 DIAGNOSIS — I2510 Atherosclerotic heart disease of native coronary artery without angina pectoris: Secondary | ICD-10-CM | POA: Diagnosis not present

## 2021-10-16 DIAGNOSIS — R001 Bradycardia, unspecified: Secondary | ICD-10-CM | POA: Diagnosis present

## 2021-10-16 DIAGNOSIS — I6602 Occlusion and stenosis of left middle cerebral artery: Secondary | ICD-10-CM | POA: Diagnosis present

## 2021-10-16 DIAGNOSIS — E1165 Type 2 diabetes mellitus with hyperglycemia: Secondary | ICD-10-CM | POA: Diagnosis not present

## 2021-10-16 DIAGNOSIS — Z811 Family history of alcohol abuse and dependence: Secondary | ICD-10-CM

## 2021-10-16 DIAGNOSIS — Z794 Long term (current) use of insulin: Secondary | ICD-10-CM

## 2021-10-16 DIAGNOSIS — K921 Melena: Secondary | ICD-10-CM | POA: Diagnosis not present

## 2021-10-16 DIAGNOSIS — K5790 Diverticulosis of intestine, part unspecified, without perforation or abscess without bleeding: Secondary | ICD-10-CM | POA: Diagnosis not present

## 2021-10-16 DIAGNOSIS — R9389 Abnormal findings on diagnostic imaging of other specified body structures: Secondary | ICD-10-CM | POA: Diagnosis present

## 2021-10-16 DIAGNOSIS — Z9842 Cataract extraction status, left eye: Secondary | ICD-10-CM

## 2021-10-16 DIAGNOSIS — F32A Depression, unspecified: Secondary | ICD-10-CM | POA: Diagnosis present

## 2021-10-16 DIAGNOSIS — E1122 Type 2 diabetes mellitus with diabetic chronic kidney disease: Secondary | ICD-10-CM | POA: Diagnosis not present

## 2021-10-16 DIAGNOSIS — I951 Orthostatic hypotension: Secondary | ICD-10-CM | POA: Diagnosis present

## 2021-10-16 DIAGNOSIS — N1831 Chronic kidney disease, stage 3a: Secondary | ICD-10-CM | POA: Diagnosis present

## 2021-10-16 DIAGNOSIS — K922 Gastrointestinal hemorrhage, unspecified: Secondary | ICD-10-CM

## 2021-10-16 DIAGNOSIS — Z953 Presence of xenogenic heart valve: Secondary | ICD-10-CM | POA: Diagnosis not present

## 2021-10-16 DIAGNOSIS — I480 Paroxysmal atrial fibrillation: Secondary | ICD-10-CM | POA: Diagnosis not present

## 2021-10-16 DIAGNOSIS — R3129 Other microscopic hematuria: Secondary | ICD-10-CM | POA: Diagnosis present

## 2021-10-16 DIAGNOSIS — Z8673 Personal history of transient ischemic attack (TIA), and cerebral infarction without residual deficits: Secondary | ICD-10-CM | POA: Diagnosis not present

## 2021-10-16 DIAGNOSIS — N9489 Other specified conditions associated with female genital organs and menstrual cycle: Secondary | ICD-10-CM

## 2021-10-16 DIAGNOSIS — Z79899 Other long term (current) drug therapy: Secondary | ICD-10-CM

## 2021-10-16 DIAGNOSIS — Z9012 Acquired absence of left breast and nipple: Secondary | ICD-10-CM

## 2021-10-16 DIAGNOSIS — R8281 Pyuria: Secondary | ICD-10-CM | POA: Diagnosis not present

## 2021-10-16 DIAGNOSIS — K219 Gastro-esophageal reflux disease without esophagitis: Secondary | ICD-10-CM | POA: Diagnosis present

## 2021-10-16 DIAGNOSIS — Z9841 Cataract extraction status, right eye: Secondary | ICD-10-CM

## 2021-10-16 DIAGNOSIS — E7800 Pure hypercholesterolemia, unspecified: Secondary | ICD-10-CM | POA: Diagnosis present

## 2021-10-16 DIAGNOSIS — R944 Abnormal results of kidney function studies: Secondary | ICD-10-CM | POA: Diagnosis present

## 2021-10-16 LAB — CBC WITH DIFFERENTIAL/PLATELET
Abs Immature Granulocytes: 0.04 10*3/uL (ref 0.00–0.07)
Basophils Absolute: 0 10*3/uL (ref 0.0–0.1)
Basophils Relative: 0 %
Eosinophils Absolute: 0.1 10*3/uL (ref 0.0–0.5)
Eosinophils Relative: 2 %
HCT: 40.5 % (ref 36.0–46.0)
Hemoglobin: 13.2 g/dL (ref 12.0–15.0)
Immature Granulocytes: 1 %
Lymphocytes Relative: 27 %
Lymphs Abs: 1.8 10*3/uL (ref 0.7–4.0)
MCH: 29.9 pg (ref 26.0–34.0)
MCHC: 32.6 g/dL (ref 30.0–36.0)
MCV: 91.6 fL (ref 80.0–100.0)
Monocytes Absolute: 0.7 10*3/uL (ref 0.1–1.0)
Monocytes Relative: 11 %
Neutro Abs: 3.9 10*3/uL (ref 1.7–7.7)
Neutrophils Relative %: 59 %
Platelets: 204 10*3/uL (ref 150–400)
RBC: 4.42 MIL/uL (ref 3.87–5.11)
RDW: 12.9 % (ref 11.5–15.5)
WBC: 6.7 10*3/uL (ref 4.0–10.5)
nRBC: 0 % (ref 0.0–0.2)

## 2021-10-16 LAB — TYPE AND SCREEN
ABO/RH(D): A NEG
Antibody Screen: NEGATIVE
Weak D: POSITIVE

## 2021-10-16 LAB — URINALYSIS, ROUTINE W REFLEX MICROSCOPIC
Bilirubin Urine: NEGATIVE
Glucose, UA: >=500 mg/dL — AB
Ketones, ur: NEGATIVE mg/dL
Nitrite: NEGATIVE
Protein, ur: NEGATIVE mg/dL
RBC / HPF: >50 RBC/hpf — ABNORMAL HIGH (ref 0–5)
Specific Gravity, Urine: 1.025 (ref 1.005–1.030)
WBC, UA: >50 WBC/hpf — ABNORMAL HIGH (ref 0–5)
pH: 8 (ref 5.0–8.0)

## 2021-10-16 LAB — COMPREHENSIVE METABOLIC PANEL
ALT: 21 U/L (ref 0–44)
AST: 23 U/L (ref 15–41)
Albumin: 3.3 g/dL — ABNORMAL LOW (ref 3.5–5.0)
Alkaline Phosphatase: 70 U/L (ref 38–126)
Anion gap: 7 (ref 5–15)
BUN: 30 mg/dL — ABNORMAL HIGH (ref 8–23)
CO2: 26 mmol/L (ref 22–32)
Calcium: 9.2 mg/dL (ref 8.9–10.3)
Chloride: 107 mmol/L (ref 98–111)
Creatinine, Ser: 0.95 mg/dL (ref 0.44–1.00)
GFR, Estimated: 60 mL/min — ABNORMAL LOW (ref >60)
Glucose, Bld: 337 mg/dL — ABNORMAL HIGH (ref 70–99)
Potassium: 4.6 mmol/L (ref 3.5–5.1)
Sodium: 140 mmol/L (ref 135–145)
Total Bilirubin: 0.4 mg/dL (ref 0.3–1.2)
Total Protein: 6.4 g/dL — ABNORMAL LOW (ref 6.5–8.1)

## 2021-10-16 LAB — LIPASE, BLOOD: Lipase: 35 U/L (ref 11–51)

## 2021-10-16 LAB — APTT: aPTT: 31 seconds (ref 24–36)

## 2021-10-16 LAB — POC OCCULT BLOOD, ED: Fecal Occult Bld: POSITIVE — AB

## 2021-10-16 LAB — LACTIC ACID, PLASMA: Lactic Acid, Venous: 2 mmol/L (ref 0.5–1.9)

## 2021-10-16 LAB — PROTIME-INR
INR: 1.4 — ABNORMAL HIGH (ref 0.8–1.2)
Prothrombin Time: 16.6 seconds — ABNORMAL HIGH (ref 11.4–15.2)

## 2021-10-16 MED ORDER — IOHEXOL 300 MG/ML  SOLN
100.0000 mL | Freq: Once | INTRAMUSCULAR | Status: AC | PRN
  Administered 2021-10-16: 100 mL via INTRAVENOUS

## 2021-10-16 MED ORDER — SODIUM CHLORIDE (PF) 0.9 % IJ SOLN
INTRAMUSCULAR | Status: AC
  Filled 2021-10-16: qty 50

## 2021-10-16 MED ORDER — PANTOPRAZOLE SODIUM 40 MG IV SOLR
40.0000 mg | Freq: Once | INTRAVENOUS | Status: AC
  Administered 2021-10-16: 40 mg via INTRAVENOUS
  Filled 2021-10-16: qty 10

## 2021-10-16 NOTE — ED Triage Notes (Addendum)
Pt reports rectal bleeding starting around noon. Reports painless passing of clots. Endorses some weakness. Denies N/V/D. Reports she had a colonoscopy within the last year with Dr. Benson Norway.

## 2021-10-16 NOTE — Assessment & Plan Note (Signed)
Cystic structure in the right adnexa measuring 5.5 cm, decreased from 2020 seen on CT A/P.  Nonemergent follow-up ultrasound is recommended for further evaluation.

## 2021-10-16 NOTE — H&P (Signed)
History and Physical    Rachel Santos AVW:[id number] DOB: 03-Aug-1940 DOA: 10/16/2021  PCP: Lajean Manes, MD  Patient coming from: Home  I have personally briefly reviewed patient's old medical records in [place]  Chief Complaint: Rectal bleeding  HPI: Rachel Santos is a 81 y.o. 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  Patient reports passing bright red bloody clots from her rectum around noon on 10/16/2021.  She had several episodes while at home but none since she has been in the ED.  She has not had any rectal or abdominal pain.  She reports chronic postural dizziness unchanged from baseline.  She has been taking Xarelto and Plavix regularly as prescribed.  She denies any melena, epistaxis, hematemesis, hemoptysis, hematuria.  ED Course  Labs/Imaging on admission: I have personally reviewed following labs and imaging studies.  Initial vitals showed BP 144/64, pulse 72, RR 18, temp 97.8 F, SPO2 95% on room air.  Patient orthostatic with drop in BP to 112/46 with standing.  Labs show WBC 6.7, hemoglobin 13.2, platelets 204,000, sodium 140, potassium 4.6, bicarb 26, BUN 30, creatinine 0.95, serum glucose 337, LFTs within normal limits, lactic acid 2.0, lipase 35.  FOBT positive.  CT abdomen/pelvis with contrast is negative for acute injury abdominal process.  Diverticulosis without diverticulitis noted.  Cystic structure in the right Nexa measuring 5.5 cm is seen, decreased from 2020.  Aortic atherosclerosis noted.  Patient was given IV Protonix 40 mg.  The hospitalist service was consulted to admit for further evaluation and management.  Review of Systems: All  systems reviewed and are negative except as documented in history of present illness above.   Past Medical History:  Diagnosis Date   Anxiety    Arthritis    knees   Atrial fibrillation (HCC)    CAD (coronary artery disease)    a. pre TAVR cath 2020 mild nonobstructive CAD in the LAD and CX with moderate nononstructive disease in the mid-distal RCA and PDA.   Cancer ([place])    left breast   Cataracts, bilateral    CKD (chronic kidney disease), stage III (HCC)    CVA (cerebral vascular accident) ([place])    Depression    Diabetes mellitus    DVT (deep venous thrombosis) (HCC)    hx of   Dyspnea    with exertion   Full dentures    GERD (gastroesophageal reflux disease)    Groin hematoma    Hearing aid worn    B/L   History of TIA (transient ischemic attack)    mini stroke with vision problems   Hypercholesterolemia    Hypertension    Hypertensive nephropathy    MCI (mild cognitive impairment)    Memory loss    Middle cerebral artery stenosis, left    Mitral regurgitation    Paroxysmal atrial fibrillation (HCC)    Pneumonia    PONV (postoperative nausea and vomiting)    Premature atrial contraction    S/P Maze operation for atrial fibrillation 10/11/2012   Complete bilateral atrial lesion set using bipolar radiofrequency and cryothermy ablation with clipping of LA appendage   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   30m Sorin Memo 3D ring annuloplasty with  26 mm Edwards mc3 tricuspid ring annuloplasty    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   s/p TAVR with a 23 Edwards Sapien 3 Ultra THV via the TF approach   Thyroid nodule    Tricuspid regurgitation    Wears glasses     Past Surgical History:  Procedure Laterality Date   ARTERY REPAIR Right 01/22/2019   Procedure: Right Common Femoral Artery Repair;  Surgeon: Angelia Mould, MD;  Location: [place];  Service: Vascular;  Laterality: Right;   CARDIAC CATHETERIZATION  09-05-12   CARDIOVERSION N/A  07/31/2012   Procedure: CARDIOVERSION;  Surgeon: Josue Hector, MD;  Location: [facility] ENDOSCOPY;  Service: Cardiovascular;  Laterality: N/A;   CATARACT EXTRACTION W/ INTRAOCULAR LENS  IMPLANT, BILATERAL     CESAREAN SECTION     x 2   COLONOSCOPY WITH PROPOFOL N/A 11/28/2014   Procedure: COLONOSCOPY WITH PROPOFOL;  Surgeon: Carol Ada, MD;  Location: [facility] ENDOSCOPY;  Service: Endoscopy;  Laterality: N/A;   COLONOSCOPY WITH PROPOFOL N/A 01/22/2021   Procedure: COLONOSCOPY WITH PROPOFOL;  Surgeon: Carol Ada, MD;  Location: [facility] ENDOSCOPY;  Service: Endoscopy;  Laterality: N/A;   DILATION AND CURETTAGE OF UTERUS     FEMORAL ARTERY EXPLORATION Right 01/22/2019   Procedure: RIGHT GROIN EXPLORATION with evacuation of hematoma;  Surgeon: Angelia Mould, MD;  Location: [place];  Service: Vascular;  Laterality: Right;   FLEXIBLE SIGMOIDOSCOPY N/A 12/18/2015   Procedure: FLEXIBLE SIGMOIDOSCOPY;  Surgeon: Carol Ada, MD;  Location: [facility] ENDOSCOPY;  Service: Endoscopy;  Laterality: N/A;   INTRAOPERATIVE TRANSESOPHAGEAL ECHOCARDIOGRAM N/A 10/11/2012   Procedure: INTRAOPERATIVE TRANSESOPHAGEAL ECHOCARDIOGRAM;  Surgeon: Rexene Alberts, MD;  Location: [place];  Service: Open Heart Surgery;  Laterality: N/A;   IR ANGIO INTRA EXTRACRAN SEL COM CAROTID INNOMINATE BILAT MOD SED  05/21/2019   IR ANGIO VERTEBRAL SEL VERTEBRAL BILAT MOD SED  05/21/2019   MASTECTOMY Left 1982   MAZE N/A 10/11/2012   Procedure: MAZE;  Surgeon: Rexene Alberts, MD;  Location: [place];  Service: Open Heart Surgery;  Laterality: N/A;   MITRAL VALVE REPAIR N/A 10/11/2012   Procedure: MITRAL VALVE REPAIR (MVR);  Surgeon: Rexene Alberts, MD;  Location: [place];  Service: Open Heart Surgery;  Laterality: N/A;   MULTIPLE TOOTH EXTRACTIONS     RADIOLOGY WITH ANESTHESIA N/A 06/19/2019   Procedure: STENTING;  Surgeon: Luanne Bras, MD;  Location: [place];  Service: Radiology;  Laterality: N/A;   RIGHT HEART CATH AND CORONARY ANGIOGRAPHY N/A  12/24/2018   Procedure: RIGHT HEART CATH AND CORONARY ANGIOGRAPHY;  Surgeon: [name], MD;  Location: [place] [facility];  Service: Cardiovascular;  Laterality: N/A;   TEE WITHOUT CARDIOVERSION N/A 07/31/2012   Procedure: TRANSESOPHAGEAL ECHOCARDIOGRAM (TEE);  Surgeon: Josue Hector, MD;  Location: [place];  Service: Cardiovascular;  Laterality: N/A;   TEE WITHOUT CARDIOVERSION N/A 01/22/2019   Procedure: TRANSESOPHAGEAL ECHOCARDIOGRAM (TEE);  Surgeon: [name], MD;  Location: [place] [facility];  Service: Open Heart Surgery;  Laterality: N/A;   TRANSCATHETER AORTIC VALVE REPLACEMENT, TRANSFEMORAL N/A 01/22/2019   Procedure: TRANSCATHETER AORTIC VALVE REPLACEMENT, TRANSFEMORAL;  Surgeon: [name], MD;  Location: [place] [facility];  Service: Open Heart Surgery;  Laterality: N/A;   TRICUSPID VALVE REPLACEMENT N/A 10/11/2012   Procedure: TRICUSPID VALVE REPAIR;  Surgeon: Rexene Alberts, MD;  Location: [place];  Service: Open Heart Surgery;  Laterality: N/A;   TUBAL LIGATION      Social History:  reports that she has an unknown smoking status. She has never used smokeless tobacco. She reports that she does not drink alcohol and does not use drugs.  Allergies  Allergen Reactions   Buspirone Hcl     lethargic/tired   Metformin And Related Diarrhea   Ozempic (0.25 Or 0.5 Mg-Dose) [Semaglutide(0.25 Or 0.'5mg'$ -Dos)]     Other reaction(s): nausea   Ultram [Tramadol] Nausea And Vomiting   Nickel Rash    Pt unable to wear jewelry made of nickel.   Pneumococcal Vaccines Swelling and Rash    At injection set    Family History  Problem Relation Age of Onset   Alcohol abuse Mother    Cancer Father        prostate   Hypertension Father    Heart attack Paternal Grandfather    Stroke Neg Hx      Prior to Admission medications   Medication Sig Start Date End Date Taking? Authorizing Provider  acetaminophen (TYLENOL) 500 MG tablet Take 1,000 mg by  mouth every 6 (six) hours as needed for moderate pain or headache.   Yes [provider]  atorvastatin (LIPITOR) 80 MG tablet Take 1 tablet by mouth once daily 10/06/21  Yes McAlhany, Annita Brod, MD  Calcium Carb-Cholecalciferol (CALCIUM 600 + D PO) Take 1 tablet by mouth 2 (two) times daily.   Yes [provider]  clopidogrel (PLAVIX) 75 MG tablet Take 1 tablet by mouth once daily Patient taking differently: Take 75 mg by mouth daily. 07/28/21  Yes [name], MD  dapagliflozin propanediol (FARXIGA) 10 MG TABS tablet Take 10 mg by mouth daily. 03/03/20  Yes [provider]  docusate sodium (COLACE) 100 MG capsule Take 100-200 mg by mouth daily as needed for mild constipation.   Yes [provider]  insulin detemir (LEVEMIR) 100 UNIT/ML injection Inject 54 Units into the skin in the morning.   Yes [provider]  insulin regular (NOVOLIN R) 100 units/mL injection Inject 15 Units into the skin daily after supper.   Yes [provider]  Multiple Vitamins-Minerals (MULTIVITAMIN WITH MINERALS) tablet Take 1 tablet by mouth daily.   Yes [provider]  omeprazole (PRILOSEC) 40 MG capsule Take 40 mg by mouth daily before breakfast.  06/28/12  Yes [provider]  Rivaroxaban (XARELTO) 15 MG TABS tablet Take 1 tablet (15 mg total) by mouth daily with supper. Patient taking differently: Take 15 mg by mouth daily. 09/08/20  Yes Weaver, Scott T, PA-C  venlafaxine XR (EFFEXOR-XR) 150 MG 24 hr capsule Take 150 mg by mouth daily with breakfast. Take with 75 mg capsule to equal 225 mg daily   Yes [provider]  venlafaxine XR (EFFEXOR-XR) 75 MG 24 hr capsule Take 75 mg by mouth daily with breakfast. Take with 150 mg capsule to equal 225 mg daily   Yes [provider]  vitamin B-12 (CYANOCOBALAMIN) 500 MCG tablet Take 500 mcg by mouth every Monday.   Yes [provider]  nitroGLYCERIN (NITROSTAT) 0.4 MG  SL tablet Place 1 tablet (0.4 mg total) under the tongue every 5 (five) minutes as needed for chest pain. Patient not taking: Reported on 10/16/2021 09/08/20 10/17/22  Richardson Dopp T, PA-C    Physical Exam: Vitals:   10/16/21 2045 10/16/21 2152 10/16/21 2153 10/16/21 2156  BP: (!) 144/64 (!) 141/59 (!) 149/59 (!) 112/46  Pulse: 72 62 70 77  Resp: '18 18 18 18  '$ Temp: 97.8 F (36.6 C)  TempSrc: Oral     SpO2: 95% 96% 98% 98%  Weight: 68 kg     Height: '5\' 6"'$  (1.676 m)      Constitutional: Resting supine in bed, NAD, calm, comfortable Eyes: EOMI, lids and conjunctivae normal ENMT: Mucous membranes are moist. Posterior pharynx clear of any exudate or lesions.Normal dentition.  Neck: normal, supple, no masses. Respiratory: clear to auscultation bilaterally, no wheezing, no crackles. Normal respiratory effort. No accessory muscle use.  Cardiovascular: Regular rate and rhythm, no murmurs / rubs / gallops. No extremity edema. 2+ pedal pulses. Abdomen: no tenderness, no masses palpated.  Bowel sounds positive.  Musculoskeletal: no clubbing / cyanosis. No joint deformity upper and lower extremities. Good ROM, no contractures. Normal muscle tone.  Skin: no rashes, lesions, ulcers. No induration Neurologic: Sensation intact. Strength 5/5 in all 4.  Psychiatric:  Alert and oriented x 3. Normal mood.  Exhibits some memory deficits.  EKG: Not performed.  Assessment/Plan Principal Problem:   Acute GI bleeding Active Problems:   PAF (paroxysmal atrial fibrillation) (HCC)   History of CVA (cerebrovascular accident)   Type 2 diabetes mellitus with complication, with long-term current use of insulin (HCC)   Chronic kidney disease, stage 3a ([place])   Depression with anxiety   Abnormal finding on imaging   Rachel Santos is a 81 y.o. female with medical history significant for paroxysmal atrial fibrillation on Xarelto, severe aortic stenosis s/p TAVR, mitral and tricuspid valve disease s/p repair  of both, mild CAD, history of CVA and severe left MCA stenosis on Plavix, CKD stage IIIa, T2DM, HTN, HLD, depression/anxiety, MCI who is admitted with acute lower GI bleeding.  Assessment and Plan: * Acute GI bleeding Likely diverticular bleeding.  On Xarelto and Plavix as an outpatient.  Hemoglobin stable at 13.2.  Patient did have positive orthostatic vitals in the ED. -Keep n.p.o. -Hold Xarelto and Plavix -Follow H&H every 6 hours overnight -Transfuse PRBC if significant drop in hemoglobin, patient consents to transfusion on admission -Start on maintenance IV fluid hydration overnight  PAF (paroxysmal atrial fibrillation) (HCC) In sinus rhythm with controlled rate.  Holding Xarelto as above.  History of CVA (cerebrovascular accident) Left MCA stenosis Has developed memory issues/mild cognitive impairment since stroke. -Holding Plavix in setting of acute GI bleed -Continue atorvastatin  Type 2 diabetes mellitus with complication, with long-term current use of insulin ([place]) Placed on reduced Semglee 10 units nightly plus SSI while NPO.  Adjust as needed.  Chronic kidney disease, stage 3a ([place]) Renal function stable.  Abnormal finding on imaging Cystic structure in the right adnexa measuring 5.5 cm, decreased from 2020 seen on CT A/P.  Nonemergent follow-up ultrasound is recommended for further evaluation.  Patient/family are aware of findings.  Depression with anxiety Continue Effexor XR.  DVT prophylaxis: SCDs Start: 10/17/21 0018 Code Status: Full code, confirmed with patient on admission Family Communication: Discussed with daughter at bedside Disposition Plan: From home and likely discharge to home pending clinical progress Consults called: GI, Dr. Adriana Mccallum notified via secure chat Severity of Illness: The appropriate patient status for this patient is INPATIENT. Inpatient status is judged to be reasonable and necessary in order to provide the required intensity of service  to ensure the patient's safety. The patient's presenting symptoms, physical exam findings, and initial radiographic and laboratory data in the context of their chronic comorbidities is felt to place them at high risk for further clinical deterioration. Furthermore, it is not anticipated that the patient will be medically stable  for discharge from the hospital within 2 midnights of admission.   * I certify that at the point of admission it is my clinical judgment that the patient will require inpatient hospital care spanning beyond 2 midnights from the point of admission due to high intensity of service, high risk for further deterioration and high frequency of surveillance required.Zada Finders MD [facility]  If 7PM-7AM, please contact night-coverage [email or web address]  10/17/2021, 12:53 AM

## 2021-10-16 NOTE — ED Provider Notes (Signed)
[place] DEPT Provider Note   CSN: [id number] Arrival date & time: 10/16/21  2041     History  Chief Complaint  Patient presents with   Rectal Bleeding    Rachel Santos is a 81 y.o. female.  Patient with a history of diverticulosis presenting with rectal bleeding ongoing since about noon today.  States she has changed her pad approximately 3 times.  Denies any pain but has some cramping in her lower abdomen.  Some weakness.  No nausea, vomiting or diarrhea.  No fever.  Reports having a colonoscopy last year.  She does take Xarelto as well as Plavix. Denies any dizziness or lightheadedness.  Denies any chest pain or shortness of breath.  The history is provided by the patient.  Rectal Bleeding Associated symptoms: abdominal pain   Associated symptoms: no dizziness and no vomiting        Home Medications Prior to Admission medications   Medication Sig Start Date End Date Taking? Authorizing Provider  acetaminophen (TYLENOL) 500 MG tablet Take 1,000 mg by mouth every 6 (six) hours as needed for moderate pain or headache.    [provider]  atorvastatin (LIPITOR) 80 MG tablet Take 1 tablet by mouth once daily 10/06/21   [name], MD  Calcium Carb-Cholecalciferol (CALCIUM 600 + D PO) Take 1 tablet by mouth 2 (two) times daily.    [provider]  clopidogrel (PLAVIX) 75 MG tablet Take 1 tablet by mouth once daily 07/28/21   [name], MD  dapagliflozin propanediol (FARXIGA) 10 MG TABS tablet Take 10 mg by mouth daily. 03/03/20   [provider]  docusate sodium (COLACE) 100 MG capsule Take 100-200 mg by mouth daily as needed for mild constipation.    [provider]  insulin detemir (LEVEMIR) 100 UNIT/ML injection 50 Units daily.    [provider]  insulin regular (NOVOLIN R) 100 units/mL injection Inject 15 Units into the skin daily after supper.    [provider]   magnesium hydroxide (MILK OF MAGNESIA) 400 MG/5ML suspension Take 30 mLs by mouth daily as needed for indigestion.     [provider]  Multiple Vitamins-Minerals (MULTIVITAMIN WITH MINERALS) tablet Take 1 tablet by mouth daily.    [provider]  nitroGLYCERIN (NITROSTAT) 0.4 MG SL tablet Place 1 tablet (0.4 mg total) under the tongue every 5 (five) minutes as needed for chest pain. 09/08/20 01/18/21  Richardson Dopp T, PA-C  omeprazole (PRILOSEC) 40 MG capsule Take 40 mg by mouth daily before breakfast.  06/28/12   [provider]  Rivaroxaban (XARELTO) 15 MG TABS tablet Take 1 tablet (15 mg total) by mouth daily with supper. Patient taking differently: Take 15 mg by mouth daily. 09/08/20   Richardson Dopp T, PA-C  venlafaxine XR (EFFEXOR-XR) 150 MG 24 hr capsule Take 150 mg by mouth daily with breakfast. Take with 75 mg capsule to equal 225 mg daily    [provider]  venlafaxine XR (EFFEXOR-XR) 75 MG 24 hr capsule Take 75 mg by mouth daily with breakfast. Take with 150 mg capsule to equal 225 mg daily    [provider]  vitamin B-12 (CYANOCOBALAMIN) 500 MCG tablet Take 500 mcg by mouth every Monday.    [provider]      Allergies    Buspirone hcl, Metformin and related, Ozempic (0.25 or 0.5 mg-dose) [semaglutide(0.25 or 0.'5mg'$ -dos)], Ultram [tramadol], Nickel, and Pneumococcal vaccines    Review of Systems  Review of Systems  Constitutional:  Positive for fatigue. Negative for activity change and appetite change.  HENT:  Negative for congestion and rhinorrhea.   Respiratory:  Negative for cough, chest tightness and shortness of breath.   Cardiovascular:  Negative for chest pain.  Gastrointestinal:  Positive for abdominal pain, anal bleeding, blood in stool and hematochezia. Negative for nausea and vomiting.  Genitourinary:  Negative for dysuria and hematuria.  Musculoskeletal:  Negative for arthralgias and myalgias.  Skin:  Negative for  rash.  Neurological:  Positive for weakness. Negative for dizziness and headaches.   all other systems are negative except as noted in the HPI and PMH.    Physical Exam Updated Vital Signs BP (!) 144/64 (BP Location: Right Arm)   Pulse 72   Temp 97.8 F (36.6 C) (Oral)   Resp 18   Ht '5\' 6"'$  (1.676 m)   Wt 68 kg   SpO2 95%   BMI 24.21 kg/m  Physical Exam Vitals and nursing note reviewed.  Constitutional:      General: She is not in acute distress.    Appearance: She is well-developed.  HENT:     Head: Normocephalic and atraumatic.     Mouth/Throat:     Pharynx: No oropharyngeal exudate.  Eyes:     Conjunctiva/sclera: Conjunctivae normal.     Pupils: Pupils are equal, round, and reactive to light.  Neck:     Comments: No meningismus. Cardiovascular:     Rate and Rhythm: Normal rate and regular rhythm.     Heart sounds: Normal heart sounds. No murmur heard. Pulmonary:     Effort: Pulmonary effort is normal. No respiratory distress.     Breath sounds: Normal breath sounds.  Abdominal:     Palpations: Abdomen is soft.     Tenderness: There is no abdominal tenderness. There is no guarding or rebound.  Genitourinary:    Comments: Chaperone present, dark blood in diaper.  No active bleeding or clot Musculoskeletal:        General: No tenderness. Normal range of motion.     Cervical back: Normal range of motion and neck supple.  Skin:    General: Skin is warm.  Neurological:     Mental Status: She is alert and oriented to person, place, and time.     Cranial Nerves: No cranial nerve deficit.     Motor: No abnormal muscle tone.     Coordination: Coordination normal.     Comments:  5/5 strength throughout. CN 2-12 intact.Equal grip strength.   Psychiatric:        Behavior: Behavior normal.     ED Results / Procedures / Treatments   Labs (all labs ordered are listed, but only abnormal results are displayed) Labs Reviewed  COMPREHENSIVE METABOLIC PANEL - Abnormal;  Notable for the following components:      Result Value   Glucose, Bld 337 (*)    BUN 30 (*)    Total Protein 6.4 (*)    Albumin 3.3 (*)    GFR, Estimated 60 (*)    All other components within normal limits  PROTIME-INR - Abnormal; Notable for the following components:   Prothrombin Time 16.6 (*)    INR 1.4 (*)    All other components within normal limits  LACTIC ACID, PLASMA - Abnormal; Notable for the following components:   Lactic Acid, Venous 2.0 (*)    All other components within normal limits  URINALYSIS, ROUTINE W REFLEX MICROSCOPIC - Abnormal; Notable for  the following components:   APPearance HAZY (*)    Glucose, UA >=500 (*)    Hgb urine dipstick MODERATE (*)    Leukocytes,Ua LARGE (*)    RBC / HPF >50 (*)    WBC, UA >50 (*)    Bacteria, UA RARE (*)    All other components within normal limits  POC OCCULT BLOOD, ED - Abnormal; Notable for the following components:   Fecal Occult Bld POSITIVE (*)    All other components within normal limits  CBC WITH DIFFERENTIAL/PLATELET  LIPASE, BLOOD  APTT  TYPE AND SCREEN    EKG None  Radiology No results found.  Procedures Procedures    Medications Ordered in ED Medications  pantoprazole (PROTONIX) injection 40 mg (has no administration in time range)    ED Course/ Medical Decision Making/ A&P                           Medical Decision Making Amount and/or Complexity of Data Reviewed Labs: ordered. Decision-making details documented in ED Course. Radiology: ordered and independent interpretation performed. Decision-making details documented in ED Course. ECG/medicine tests: ordered and independent interpretation performed. Decision-making details documented in ED Course.  Risk Prescription drug management. Decision regarding hospitalization.   Rectal bleeding since noon today with clots.  Denies pain.  And diverticulosis on colonoscopy last year.  Does take xarelto and plavix  Vitals are stable.   Hemoglobin stable at 13.  Abdomen is soft without peritoneal signs.  Suspect likely diverticular source of her bleed. As she does take Xarelto and Plavix and need observation overnight given her anticoagulation use.  Message sent to Dr. Benson Norway and GI team.    Admission discussed with Dr. Posey Pronto        Final Clinical Impression(s) / ED Diagnoses Final diagnoses:  Rectal bleeding    Rx / DC Orders ED Discharge Orders     None         Ezequiel Essex, MD 10/16/21 2319

## 2021-10-17 ENCOUNTER — Encounter (HOSPITAL_COMMUNITY): Payer: Self-pay | Admitting: Internal Medicine

## 2021-10-17 ENCOUNTER — Other Ambulatory Visit: Payer: Self-pay

## 2021-10-17 DIAGNOSIS — K921 Melena: Secondary | ICD-10-CM

## 2021-10-17 DIAGNOSIS — I951 Orthostatic hypotension: Secondary | ICD-10-CM

## 2021-10-17 DIAGNOSIS — N9489 Other specified conditions associated with female genital organs and menstrual cycle: Secondary | ICD-10-CM

## 2021-10-17 DIAGNOSIS — E1165 Type 2 diabetes mellitus with hyperglycemia: Secondary | ICD-10-CM | POA: Diagnosis not present

## 2021-10-17 DIAGNOSIS — I480 Paroxysmal atrial fibrillation: Secondary | ICD-10-CM

## 2021-10-17 DIAGNOSIS — R3129 Other microscopic hematuria: Secondary | ICD-10-CM

## 2021-10-17 DIAGNOSIS — Z7901 Long term (current) use of anticoagulants: Secondary | ICD-10-CM

## 2021-10-17 DIAGNOSIS — N958 Other specified menopausal and perimenopausal disorders: Secondary | ICD-10-CM

## 2021-10-17 DIAGNOSIS — R9389 Abnormal findings on diagnostic imaging of other specified body structures: Secondary | ICD-10-CM

## 2021-10-17 DIAGNOSIS — N1831 Chronic kidney disease, stage 3a: Secondary | ICD-10-CM

## 2021-10-17 DIAGNOSIS — Z8673 Personal history of transient ischemic attack (TIA), and cerebral infarction without residual deficits: Secondary | ICD-10-CM | POA: Diagnosis not present

## 2021-10-17 DIAGNOSIS — K922 Gastrointestinal hemorrhage, unspecified: Secondary | ICD-10-CM | POA: Diagnosis not present

## 2021-10-17 DIAGNOSIS — Z794 Long term (current) use of insulin: Secondary | ICD-10-CM

## 2021-10-17 DIAGNOSIS — E118 Type 2 diabetes mellitus with unspecified complications: Secondary | ICD-10-CM | POA: Diagnosis not present

## 2021-10-17 DIAGNOSIS — F418 Other specified anxiety disorders: Secondary | ICD-10-CM

## 2021-10-17 DIAGNOSIS — R8281 Pyuria: Secondary | ICD-10-CM

## 2021-10-17 LAB — CBC
HCT: 39.3 % (ref 36.0–46.0)
Hemoglobin: 13.1 g/dL (ref 12.0–15.0)
MCH: 30.5 pg (ref 26.0–34.0)
MCHC: 33.3 g/dL (ref 30.0–36.0)
MCV: 91.6 fL (ref 80.0–100.0)
Platelets: 201 10*3/uL (ref 150–400)
RBC: 4.29 MIL/uL (ref 3.87–5.11)
RDW: 13.1 % (ref 11.5–15.5)
WBC: 8.1 10*3/uL (ref 4.0–10.5)
nRBC: 0 % (ref 0.0–0.2)

## 2021-10-17 LAB — BASIC METABOLIC PANEL
Anion gap: 6 (ref 5–15)
BUN: 27 mg/dL — ABNORMAL HIGH (ref 8–23)
CO2: 28 mmol/L (ref 22–32)
Calcium: 8.9 mg/dL (ref 8.9–10.3)
Chloride: 108 mmol/L (ref 98–111)
Creatinine, Ser: 0.75 mg/dL (ref 0.44–1.00)
GFR, Estimated: >60 mL/min (ref >60)
Glucose, Bld: 78 mg/dL (ref 70–99)
Potassium: 4 mmol/L (ref 3.5–5.1)
Sodium: 142 mmol/L (ref 135–145)

## 2021-10-17 LAB — HEMOGLOBIN AND HEMATOCRIT, BLOOD
HCT: 37.6 % (ref 36.0–46.0)
HCT: 39.2 % (ref 36.0–46.0)
Hemoglobin: 12.2 g/dL (ref 12.0–15.0)
Hemoglobin: 12.6 g/dL (ref 12.0–15.0)

## 2021-10-17 LAB — GLUCOSE, CAPILLARY
Glucose-Capillary: 96 mg/dL (ref 70–99)
Glucose-Capillary: 50 mg/dL — ABNORMAL LOW (ref 70–99)
Glucose-Capillary: 174 mg/dL — ABNORMAL HIGH (ref 70–99)
Glucose-Capillary: 95 mg/dL (ref 70–99)
Glucose-Capillary: 101 mg/dL — ABNORMAL HIGH (ref 70–99)
Glucose-Capillary: 185 mg/dL — ABNORMAL HIGH (ref 70–99)
Glucose-Capillary: 235 mg/dL — ABNORMAL HIGH (ref 70–99)

## 2021-10-17 LAB — HEMOGLOBIN A1C
Hgb A1c MFr Bld: 8.3 % — ABNORMAL HIGH (ref 4.8–5.6)
Mean Plasma Glucose: 191.51 mg/dL

## 2021-10-17 LAB — CBG MONITORING, ED: Glucose-Capillary: 121 mg/dL — ABNORMAL HIGH (ref 70–99)

## 2021-10-17 MED ORDER — DEXTROSE 50 % IV SOLN
1.0000 | Freq: Once | INTRAVENOUS | Status: AC
  Administered 2021-10-17: 50 mL via INTRAVENOUS
  Filled 2021-10-17: qty 50

## 2021-10-17 MED ORDER — INSULIN GLARGINE-YFGN 100 UNIT/ML ~~LOC~~ SOLN
10.0000 [IU] | Freq: Every day | SUBCUTANEOUS | Status: DC
  Administered 2021-10-17: 10 [IU] via SUBCUTANEOUS
  Filled 2021-10-17: qty 0.1

## 2021-10-17 MED ORDER — PANTOPRAZOLE SODIUM 40 MG PO TBEC
40.0000 mg | DELAYED_RELEASE_TABLET | Freq: Every day | ORAL | Status: DC
  Administered 2021-10-17 – 2021-10-20 (×4): 40 mg via ORAL
  Filled 2021-10-17 (×4): qty 1

## 2021-10-17 MED ORDER — VENLAFAXINE HCL ER 75 MG PO CP24: 150.0000 mg | ORAL_CAPSULE | Freq: Every day | ORAL | Status: DC

## 2021-10-17 MED ORDER — INSULIN ASPART 100 UNIT/ML IJ SOLN: 0.0000 [IU] | Freq: Every day | INTRAMUSCULAR | Status: DC

## 2021-10-17 MED ORDER — ONDANSETRON HCL 4 MG PO TABS: 4.0000 mg | ORAL_TABLET | Freq: Four times a day (QID) | ORAL | Status: DC | PRN

## 2021-10-17 MED ORDER — ACETAMINOPHEN 650 MG RE SUPP: 650.0000 mg | Freq: Four times a day (QID) | RECTAL | Status: DC | PRN

## 2021-10-17 MED ORDER — ATORVASTATIN CALCIUM 40 MG PO TABS
80.0000 mg | ORAL_TABLET | Freq: Every day | ORAL | Status: DC
  Administered 2021-10-17 – 2021-10-20 (×4): 80 mg via ORAL
  Filled 2021-10-17 (×4): qty 2

## 2021-10-17 MED ORDER — SODIUM CHLORIDE 0.9 % IV SOLN: INTRAVENOUS | Status: DC

## 2021-10-17 MED ORDER — VENLAFAXINE HCL ER 150 MG PO CP24
225.0000 mg | ORAL_CAPSULE | Freq: Every day | ORAL | Status: DC
Start: 2021-10-17 — End: 2021-10-20
  Administered 2021-10-17 – 2021-10-20 (×4): 225 mg via ORAL
  Filled 2021-10-17 (×5): qty 1

## 2021-10-17 MED ORDER — ONDANSETRON HCL 4 MG/2ML IJ SOLN: 4.0000 mg | Freq: Four times a day (QID) | INTRAMUSCULAR | Status: DC | PRN

## 2021-10-17 MED ORDER — VENLAFAXINE HCL ER 75 MG PO CP24
75.0000 mg | ORAL_CAPSULE | Freq: Every day | ORAL | Status: DC
Start: 2021-10-17 — End: 2021-10-17

## 2021-10-17 MED ORDER — INSULIN ASPART 100 UNIT/ML IJ SOLN
0.0000 [IU] | Freq: Three times a day (TID) | INTRAMUSCULAR | Status: DC
  Administered 2021-10-17 – 2021-10-18 (×2): 1 [IU] via SUBCUTANEOUS
  Administered 2021-10-18: 2 [IU] via SUBCUTANEOUS

## 2021-10-17 MED ORDER — ACETAMINOPHEN 325 MG PO TABS: 650.0000 mg | ORAL_TABLET | Freq: Four times a day (QID) | ORAL | Status: DC | PRN

## 2021-10-17 MED ORDER — INSULIN ASPART 100 UNIT/ML IJ SOLN
0.0000 [IU] | INTRAMUSCULAR | Status: DC
  Filled 2021-10-17: qty 0.09

## 2021-10-17 MED ORDER — SODIUM CHLORIDE 0.9% FLUSH
3.0000 mL | Freq: Two times a day (BID) | INTRAVENOUS | Status: DC
  Administered 2021-10-17 – 2021-10-19 (×3): 3 mL via INTRAVENOUS

## 2021-10-17 MED ORDER — DEXTROSE-NACL 5-0.9 % IV SOLN: INTRAVENOUS | Status: DC

## 2021-10-17 NOTE — Assessment & Plan Note (Signed)
Left MCA stenosis Has developed memory issues/mild cognitive impairment since stroke. -Holding Plavix in setting of acute GI bleed -Continue atorvastatin

## 2021-10-17 NOTE — Assessment & Plan Note (Signed)
Renal function stable.

## 2021-10-17 NOTE — Assessment & Plan Note (Signed)
Placed on reduced Semglee 10 units nightly plus SSI while NPO.  Adjust as needed.

## 2021-10-17 NOTE — Assessment & Plan Note (Signed)
Likely diverticular bleeding.  On Xarelto and Plavix as an outpatient.  Hemoglobin stable at 13.2.  Patient did have positive orthostatic vitals in the ED. -Keep n.p.o. -Hold Xarelto and Plavix -Follow H&H every 6 hours overnight -Transfuse PRBC if significant drop in hemoglobin, patient consents to transfusion on admission -Start on maintenance IV fluid hydration overnight

## 2021-10-17 NOTE — Evaluation (Signed)
Occupational Therapy Evaluation Patient Details Name: Rachel Santos MRN: [id number] DOB: 01/29/1941 Today's Date: 10/17/2021   History of Present Illness Patient is a 81 year old female who was admitted with GI bleed. PMH: TAVR, a fib, CVA, GERD, DM, TIAs   Clinical Impression   Patient is a 81 year old female who was admitted for above. Patient was noted to be near baseline in room with daughter reporting patient gets dizziness at baseline with moving too quickly. Patient to have family support at home at time of d/c. Anticipate patient will progress to not need HH OT at time of d/c. Patient would continue to benefit from skilled OT services at this time while admitted to address noted deficits in order to improve overall safety and independence in ADLs.        Recommendations for follow up therapy are one component of a multi-disciplinary discharge planning process, led by the attending physician.  Recommendations may be updated based on patient status, additional functional criteria and insurance authorization.   Follow Up Recommendations  No OT follow up    Assistance Recommended at Discharge Intermittent Supervision/Assistance  Patient can return home with the following Assistance with cooking/housework;Direct supervision/assist for financial management;Assist for transportation;Help with stairs or ramp for entrance;Direct supervision/assist for medications management    Functional Status Assessment  Patient has had a recent decline in their functional status and demonstrates the ability to make significant improvements in function in a reasonable and predictable amount of time.  Equipment Recommendations  None recommended by OT    Recommendations for Other Services       Precautions / Restrictions Precautions Precautions: Fall Restrictions Weight Bearing Restrictions: No      Mobility Bed Mobility Overal bed mobility: Needs Assistance Bed Mobility: Supine to Sit,  Sit to Supine     Supine to sit: Supervision, HOB elevated Sit to supine: Supervision        Transfers                          Balance Overall balance assessment: Mild deficits observed, not formally tested                                         ADL either performed or assessed with clinical judgement   ADL Overall ADL's : Needs assistance/impaired Eating/Feeding: Set up;Sitting   Grooming: Set up;Supervision/safety;Oral care;Standing Grooming Details (indicate cue type and reason): at sink in room with safet cues when patient attempted to stand over IV pole and attempted to kick it out of the way. Upper Body Bathing: Set up;Sitting   Lower Body Bathing: Minimal assistance;Sit to/from stand;Sitting/lateral leans   Upper Body Dressing : Set up;Sitting   Lower Body Dressing: Minimal assistance;Sit to/from stand;Sitting/lateral leans   Toilet Transfer: Min guard;Ambulation Toilet Transfer Details (indicate cue type and reason): with IV pole typically used cane at home. patient needed cues to slow down and where to hold V pole Toileting- Clothing Manipulation and Hygiene: Supervision/safety;Sit to/from stand Toileting - Clothing Manipulation Details (indicate cue type and reason): at commode with typical underwear in place,             Vision Patient Visual Report: No change from baseline       Perception     Praxis      Pertinent Vitals/Pain Pain Assessment Pain Assessment: No/denies pain  Hand Dominance Right   Extremity/Trunk Assessment Upper Extremity Assessment Upper Extremity Assessment: Overall WFL for tasks assessed   Lower Extremity Assessment Lower Extremity Assessment: Defer to PT evaluation   Cervical / Trunk Assessment Cervical / Trunk Assessment: Normal   Communication Communication Communication: HOH   Cognition Arousal/Alertness: Awake/alert Behavior During Therapy: WFL for tasks  assessed/performed Overall Cognitive Status: Within Functional Limits for tasks assessed                                 General Comments: HOH     General Comments       Exercises     Shoulder Instructions      Home Living Family/patient expects to be discharged to:: Private residence Living Arrangements: Alone Available Help at Discharge: Family;Available 24 hours/day Type of Home: Apartment Home Access: Level entry     Home Layout: One level     Bathroom Shower/Tub: Tub/shower unit         Home Equipment: Conservation officer, nature (2 wheels);Cane - quad          Prior Functioning/Environment Prior Level of Function : Independent/Modified Independent               ADLs Comments: daughter helps with picking up groceries and driving.        OT Problem List: Decreased activity tolerance;Impaired balance (sitting and/or standing);Decreased knowledge of precautions;Decreased knowledge of use of DME or AE      OT Treatment/Interventions: Self-care/ADL training;Therapeutic exercise;Neuromuscular education;Energy conservation;DME and/or AE instruction;Therapeutic activities;Balance training;Patient/family education    OT Goals(Current goals can be found in the care plan section) Acute Rehab OT Goals Patient Stated Goal: to get better OT Goal Formulation: With patient/family Time For Goal Achievement: 10/31/21 Potential to Achieve Goals: Fair  OT Frequency: Min 2X/week    Co-evaluation              AM-PAC OT "6 Clicks" Daily Activity     Outcome Measure Help from another person eating meals?: None Help from another person taking care of personal grooming?: A Little Help from another person toileting, which includes using toliet, bedpan, or urinal?: A Little Help from another person bathing (including washing, rinsing, drying)?: A Little Help from another person to put on and taking off regular upper body clothing?: A Little Help from another  person to put on and taking off regular lower body clothing?: A Little 6 Click Score: 19   End of Session Equipment Utilized During Treatment: Gait belt Nurse Communication: Other (comment) (ok to participate in session)  Activity Tolerance: Patient tolerated treatment well Patient left: in bed;with call bell/phone within reach;with family/visitor present  OT Visit Diagnosis: Unsteadiness on feet (R26.81);Muscle weakness (generalized) (M62.81)                Time: 4081-4481 OT Time Calculation (min): 16 min Charges:  OT General Charges $OT Visit: 1 Visit OT Evaluation $OT Eval Low Complexity: 1 Low  Leota Sauers, MS Acute Rehabilitation Department Office# [phone] Pager# [phone]   Marcellina Millin 10/17/2021, 3:59 PM

## 2021-10-17 NOTE — Assessment & Plan Note (Signed)
In sinus rhythm with controlled rate.  Holding Xarelto as above.

## 2021-10-17 NOTE — Assessment & Plan Note (Signed)
Continue Effexor XR.

## 2021-10-17 NOTE — Progress Notes (Addendum)
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  On admission, vitals stable.  Hgb 13.2.  Hemoccult positive.  CT abdomen and pelvis without significant finding other than diverticulosis and known right adnexal cystic structure measuring about 5.5 cm which seems to be decreased compared to prior imaging in 2020.  The next day, H&H remained stable.  No further GI bleed.  GI consulted.    Subjective: Seen and examined earlier this morning.  No major events overnight of this morning.  No further bleeding.  Denies nausea, vomiting abdominal pain.  She denies NSAID use.  Denies UTI symptoms.  Daughter at bedside.  Did not feel dizzy or lightheaded when she got up to go to the bathroom with supervision.  Objective: Vitals:   10/17/21 0102 10/17/21 0121 10/17/21 0442 10/17/21 0955  BP:  (!) 144/65 (!) 162/57 (!) 150/59  Pulse:  62 66 (!) 56  Resp:  '18 16 18  '$ Temp: 98.1 F (36.7 C) 98.1 F (36.7 C) 98.4 F (36.9 C) 98.5 F (36.9 C)  TempSrc:  Oral Oral Oral  SpO2:  98% 97% 95%  Weight:      Height:        Examination:  GENERAL: No apparent distress.  Nontoxic. HEENT: MMM.  Vision and hearing grossly intact.  NECK: Supple.  No apparent JVD.  RESP:  No IWOB.  Fair aeration bilaterally. CVS:  RRR. Heart sounds normal.  ABD/GI/GU: BS+. Abd soft, NTND.  MSK/EXT:  Moves extremities. No apparent deformity. No edema.  SKIN: no apparent skin lesion  or wound NEURO: Awake, alert and oriented appropriately.  No apparent focal neuro deficit. PSYCH: Calm. Normal affect.   Procedures:  None  Microbiology summarized: None  Assessment and plan: Principal Problem:   Acute GI bleeding Active Problems:   PAF (paroxysmal atrial fibrillation) (HCC)   History of CVA (cerebrovascular accident)   Type 2 diabetes mellitus with complication, with long-term current use of insulin (HCC)   Chronic kidney disease, stage 3a ([place])   Depression with anxiety   Abnormal finding on imaging   Simple adnexal cyst greater than 5 cm in diameter in premenopausal patient  Painless hematochezia: Likely diverticular bleed but she has slightly elevated BUN to creatinine ratio as well.  Hemoccult positive.  H&H stable.  No further GI bleed here.  Followed by [facility].  Colonoscopy in 12/2020 with diverticulosis but no mention of hemorrhoids.  -GI recs: CLD, hold Xarelto and Plavix, trend CBC, continue PPI and Dr. Benson Norway to see patient on 7/24  Paroxysmal A-fib: In sinus rhythm and rate controlled without meds.  On Xarelto for anticoagulation. -Xarelto on hold due to GI bleed.   History of CVA/left MCA stenosis: On Plavix at home. -Continue holding Plavix in setting of GI bleed -Continue home Lipitor  Chronic orthostatic hypotension: Orthostatic vitals positive.  She is not on antihypertensive meds other than Farxiga. -Encourage wearing TED hose and staying upright -Advised stepwise approach when she  gets up and move -PT/OT eval   Uncontrolled IDDM-2 with hyperglycemia, hyperglycemia, CKD 3A and hyperlipidemia: A1c 8.3%. Recent Labs  Lab 10/17/21 0057 10/17/21 0127 10/17/21 0436 10/17/21 0551 10/17/21 0730  GLUCAP 121* 96 50* 174* 95  -Hold home Farxiga -Discontinue Semglee -Continue SSI-sensitive.  Changed to ACHS -Continue statin.  Pyuria/microscopic hematuria: No UTI symptoms.  Hematuria likely from anticoagulation and  Plavix. -Monitor  CKD-3A: Stable. Recent Labs    01/13/21 1418 10/16/21 2138 10/17/21 0257  BUN  --  30* 27*  CREATININE 0.90 0.95 0.75  -Continue monitoring  Right adnexal cyst: 5.5 cm right adnexal cystic structure noted on CT but it decreased in size compared to prior CT. -Nonemergent follow-up US is recommended. Patient/family are aware of findings.   Anxiety and depression: Stable. -Continue Effexor XR.  Body mass index is 24.21 kg/m.           DVT prophylaxis:  SCDs Start: 10/17/21 0018  Code Status: Full code Family Communication: Updated patient's daughter at bedside Level of care: Telemetry Status is: Inpatient Remains inpatient appropriate because: Due to GI bleed   Final disposition: Likely home once medically cleared Consultants:  Gastroenterology  Sch Meds:  Scheduled Meds:  atorvastatin  80 mg Oral Daily   insulin aspart  0-5 Units Subcutaneous QHS   insulin aspart  0-6 Units Subcutaneous TID WC   pantoprazole  40 mg Oral Daily   sodium chloride flush  3 mL Intravenous Q12H   venlafaxine XR  225 mg Oral Q breakfast   Continuous Infusions:  dextrose 5 % and 0.9% NaCl 65 mL/hr at 10/17/21 1013   PRN Meds:.acetaminophen **OR** acetaminophen, ondansetron **OR** ondansetron (ZOFRAN) IV  Antimicrobials: Anti-infectives (From admission, onward)    None        I have personally reviewed the following labs and images: CBC: Recent Labs  Lab 10/16/21 2138 10/17/21 0257 10/17/21 1024  WBC 6.7 8.1  --   NEUTROABS 3.9  --   --   HGB 13.2 13.1 12.2  HCT 40.5 39.3 37.6  MCV 91.6 91.6  --   PLT 204 201  --    BMP &GFR Recent Labs  Lab 10/16/21 2138 10/17/21 0257  NA 140 142  K 4.6 4.0  CL 107 108  CO2 26 28  GLUCOSE 337* 78  BUN 30* 27*  CREATININE 0.95 0.75  CALCIUM 9.2 8.9   Estimated Creatinine Clearance: 50.8 mL/min (by C-G formula based on SCr of 0.75 mg/dL). Liver & Pancreas: Recent Labs  Lab 10/16/21 2138  AST 23   ALT 21  ALKPHOS 70  BILITOT 0.4  PROT 6.4*  ALBUMIN 3.3*   Recent Labs  Lab 10/16/21 2138  LIPASE 35   No results for input(s): "AMMONIA" in the last 168 hours. Diabetic: Recent Labs    10/17/21 0257  HGBA1C 8.3*   Recent Labs  Lab 10/17/21 0057 10/17/21 0127 10/17/21 0436 10/17/21 0551 10/17/21 0730  GLUCAP 121* 96 50* 174* 95   Cardiac Enzymes: No results for input(s): "CKTOTAL", "CKMB", "CKMBINDEX", "TROPONINI" in the last 168 hours. No results for input(s): "PROBNP" in the last 8760 hours. Coagulation Profile: Recent Labs  Lab 10/16/21 2138  INR 1.4*   Thyroid Function Tests: No results for input(s): "TSH", "T4TOTAL", "FREET4", "T3FREE", "THYROIDAB" in the last 72 hours. Lipid Profile: No results for input(s): "CHOL", "HDL", "LDLCALC", "TRIG", "CHOLHDL", "LDLDIRECT" in the last 72 hours. Anemia Panel: No results for input(s): "VITAMINB12", "FOLATE", "FERRITIN", "TIBC", "IRON", "RETICCTPCT" in the last  72 hours. Urine analysis:    Component Value Date/Time   COLORURINE YELLOW 10/16/2021 2244   APPEARANCEUR HAZY (A) 10/16/2021 2244   LABSPEC 1.025 10/16/2021 2244   PHURINE 8.0 10/16/2021 2244   GLUCOSEU >=500 (A) 10/16/2021 2244   HGBUR MODERATE (A) 10/16/2021 2244   BILIRUBINUR NEGATIVE 10/16/2021 2244   KETONESUR NEGATIVE 10/16/2021 2244   PROTEINUR NEGATIVE 10/16/2021 2244   UROBILINOGEN 1.0 10/09/2012 1630   NITRITE NEGATIVE 10/16/2021 2244   LEUKOCYTESUR LARGE (A) 10/16/2021 2244   Sepsis Labs: Invalid input(s): "PROCALCITONIN", "LACTICIDVEN"  Microbiology: No results found for this or any previous visit (from the past 240 hour(s)).  Radiology Studies: CT ABDOMEN PELVIS W CONTRAST  Result Date: 10/16/2021 CLINICAL DATA:  Lower GI bleed, rectal bleeding. EXAM: CT ABDOMEN AND PELVIS WITH CONTRAST TECHNIQUE: Multidetector CT imaging of the abdomen and pelvis was performed using the standard protocol following bolus administration of intravenous  contrast. RADIATION DOSE REDUCTION: This exam was performed according to the departmental dose-optimization program which includes automated exposure control, adjustment of the mA and/or kV according to patient size and/or use of iterative reconstruction technique. CONTRAST:  157m OMNIPAQUE IOHEXOL 300 MG/ML  SOLN COMPARISON:  01/02/2019. FINDINGS: Lower chest: No acute abnormality. Hepatobiliary: No focal liver abnormality is seen. Fatty infiltration of the liver is noted. No biliary ductal dilatation. Stones are present within the gallbladder. Pancreas: Pancreatic atrophy. No pancreatic ductal dilatation or surrounding inflammatory changes. Spleen: Normal in size without focal abnormality. Adrenals/Urinary Tract: The adrenal glands are within normal limits. A cyst is present in the lower pole of the right kidney. Subcentimeter hypodensities are present in the kidneys bilaterally which are too small to further characterize. Rule out renal calculus or hydronephrosis. The bladder is unremarkable. Stomach/Bowel: There is a small hiatal hernia. The stomach is within normal limits. No bowel obstruction, free air, or pneumatosis. Scattered diverticula are present along the colon without evidence of diverticulitis. No focal bowel wall thickening or surrounding inflammatory changes. The appendix is not visualized on exam. Vascular/Lymphatic: Aortic atherosclerosis. No enlarged abdominal or pelvic lymph nodes. Reproductive: The uterus is within normal limits. There is a cyst in the right adnexa measuring 5.5 cm, increased in size from 2020. No adnexal mass on the left. Other: No abdominopelvic ascites. A fat containing umbilical hernia is noted. Increased density is noted in the anterior abdominal wall bilaterally which is unchanged from 2020. Musculoskeletal: Degenerative changes are present in the thoracic spine. No acute osseous abnormality. IMPRESSION: 1. No acute intra-abdominal process. 2. Diverticulosis without  diverticulitis. 3. Cystic structure in the right adnexa measuring 5.5 cm, decreased from 2020. Ultrasound is recommended for further evaluation on nonemergent follow-up. 4. Aortic atherosclerosis. 5. Remaining incidental findings as described above. Electronically Signed   By: LBrett FairyM.D.   On: 10/16/2021 23:25      Anitria Andon T. GAmoret If 7PM-7AM, please contact night-coverage [email or web address] 10/17/2021, 11:55 AM

## 2021-10-17 NOTE — Consult Note (Signed)
GI Consult Note Covering for Drs. Mann/Hung   Referring Provider: Zada Finders, MD Primary Care Physician:  Lajean Manes, MD Primary Gastroenterologist:  Dr. Carol Ada  Reason for Consultation:  Hematochezia   Assessment    Acute painless hematochezia c/w a diverticular bleed GERD PAF on Xarelto History of CVA, left MCA stenosis on Plavix CKD3 DM2 Right adnexal cystic lesion   Recommendations    Clear liquids diet and monitor for rebleeding.  Hold Xarelto and Plavix for now Trend CBC Continue pantoprazole for GERD Dr. Benson Norway to resume care on Monday    HPI: Rachel Santos is a 81 y.o. female present with acute painless rectal bleeding. She describes one episode of dark red clots per rectum at home yesterday. No recurrent bleeding or bowel movements since then. CT AP performed in ED, not CTA. Colonoscopy in Oct 2022 showed sigmoid colon diverticulosis, otherwise normal. 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 Denies weight loss, abdominal pain, constipation, diarrhea, change in stool caliber, melena, nausea, vomiting, dysphagia, reflux symptoms, chest pain.    Past Medical History:  Diagnosis Date   Anxiety    Arthritis    knees   Atrial fibrillation (HCC)    CAD (coronary artery disease)    a. pre TAVR cath 2020 mild nonobstructive CAD in the LAD and CX with moderate nononstructive disease in the mid-distal RCA and PDA.   Cancer ([place])    left breast   Cataracts, bilateral    CKD (chronic kidney disease), stage III ([place])    CVA (cerebral vascular accident) ([place])    Depression    Diabetes mellitus    DVT (deep venous thrombosis) (HCC)    hx of   Dyspnea    with exertion   Full dentures    GERD (gastroesophageal reflux disease)    Groin hematoma     Hearing aid worn    B/L   History of TIA (transient ischemic attack)    mini stroke with vision problems   Hypercholesterolemia    Hypertension    Hypertensive nephropathy    MCI (mild cognitive impairment)    Memory loss    Middle cerebral artery stenosis, left    Mitral regurgitation    Paroxysmal atrial fibrillation (HCC)    Pneumonia    PONV (postoperative nausea and vomiting)    Premature atrial contraction    S/P Maze operation for atrial fibrillation 10/11/2012   Complete bilateral atrial lesion set using bipolar radiofrequency and cryothermy ablation with clipping of LA appendage   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   20m Sorin Memo 3D ring annuloplasty with 26 mm Edwards mc3 tricuspid ring annuloplasty    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   s/p TAVR with a 23 Edwards Sapien 3 Ultra THV via the TF approach   Thyroid nodule    Tricuspid regurgitation    Wears glasses     Past Surgical History:  Procedure Laterality Date   ARTERY REPAIR Right 01/22/2019   Procedure: Right Common Femoral Artery Repair;  Surgeon: DAngelia Mould MD;  [place] [facility]  Service: Vascular;  Laterality: Right;   CARDIAC CATHETERIZATION  09-05-12   CARDIOVERSION N/A 07/31/2012   Procedure: CARDIOVERSION;  Surgeon: PJosue Hector MD;  [facility];  Service: Cardiovascular;  Laterality: N/A;  CATARACT EXTRACTION W/ INTRAOCULAR LENS  IMPLANT, BILATERAL     CESAREAN SECTION     x 2   COLONOSCOPY WITH PROPOFOL N/A 11/28/2014   Procedure: COLONOSCOPY WITH PROPOFOL;  Surgeon: Carol Ada, MD;  Location: [facility];  Service: Endoscopy;  Laterality: N/A;   COLONOSCOPY WITH PROPOFOL N/A 01/22/2021   Procedure: COLONOSCOPY WITH PROPOFOL;  Surgeon: Carol Ada, MD;  Location: [facility];  Service: Endoscopy;  Laterality: N/A;   DILATION AND CURETTAGE OF UTERUS     FEMORAL ARTERY EXPLORATION Right 01/22/2019   Procedure: RIGHT GROIN EXPLORATION with  evacuation of hematoma;  Surgeon: Angelia Mould, MD;  Location: [place];  Service: Vascular;  Laterality: Right;   FLEXIBLE SIGMOIDOSCOPY N/A 12/18/2015   Procedure: FLEXIBLE SIGMOIDOSCOPY;  Surgeon: Carol Ada, MD;  Location: [facility];  Service: Endoscopy;  Laterality: N/A;   INTRAOPERATIVE TRANSESOPHAGEAL ECHOCARDIOGRAM N/A 10/11/2012   Procedure: INTRAOPERATIVE TRANSESOPHAGEAL ECHOCARDIOGRAM;  Surgeon: Rexene Alberts, MD;  Location: [place];  Service: Open Heart Surgery;  Laterality: N/A;   IR ANGIO INTRA EXTRACRAN SEL COM CAROTID INNOMINATE BILAT MOD SED  05/21/2019   IR ANGIO VERTEBRAL SEL VERTEBRAL BILAT MOD SED  05/21/2019   MASTECTOMY Left 1982   MAZE N/A 10/11/2012   Procedure: MAZE;  Surgeon: Rexene Alberts, MD;  Location: [place];  Service: Open Heart Surgery;  Laterality: N/A;   MITRAL VALVE REPAIR N/A 10/11/2012   Procedure: MITRAL VALVE REPAIR (MVR);  Surgeon: Rexene Alberts, MD;  Location: [place];  Service: Open Heart Surgery;  Laterality: N/A;   MULTIPLE TOOTH EXTRACTIONS     RADIOLOGY WITH ANESTHESIA N/A 06/19/2019   Procedure: STENTING;  Surgeon: Luanne Bras, MD;  Location: [place];  Service: Radiology;  Laterality: N/A;   RIGHT HEART CATH AND CORONARY ANGIOGRAPHY N/A 12/24/2018   Procedure: RIGHT HEART CATH AND CORONARY ANGIOGRAPHY;  Surgeon: [name], MD;  Location: [place] CV LAB;  Service: Cardiovascular;  Laterality: N/A;   TEE WITHOUT CARDIOVERSION N/A 07/31/2012   Procedure: TRANSESOPHAGEAL ECHOCARDIOGRAM (TEE);  Surgeon: Josue Hector, MD;  Location: [place];  Service: Cardiovascular;  Laterality: N/A;   TEE WITHOUT CARDIOVERSION N/A 01/22/2019   Procedure: TRANSESOPHAGEAL ECHOCARDIOGRAM (TEE);  Surgeon: [name], MD;  Location: [place] [facility];  Service: Open Heart Surgery;  Laterality: N/A;   TRANSCATHETER AORTIC VALVE REPLACEMENT, TRANSFEMORAL N/A 01/22/2019   Procedure: TRANSCATHETER AORTIC VALVE REPLACEMENT,  TRANSFEMORAL;  Surgeon: [name], MD;  Location: [place] CV LAB;  Service: Open Heart Surgery;  Laterality: N/A;   TRICUSPID VALVE REPLACEMENT N/A 10/11/2012   Procedure: TRICUSPID VALVE REPAIR;  Surgeon: Rexene Alberts, MD;  Location: [place];  Service: Open Heart Surgery;  Laterality: N/A;   TUBAL LIGATION      Prior to Admission medications   Medication Sig Start Date End Date Taking? Authorizing Provider  acetaminophen (TYLENOL) 500 MG tablet Take 1,000 mg by mouth every 6 (six) hours as needed for moderate pain or headache.   Yes [provider]  atorvastatin (LIPITOR) 80 MG tablet Take 1 tablet by mouth once daily 10/06/21  Yes McAlhany, Annita Brod, MD  Calcium Carb-Cholecalciferol (CALCIUM 600 + D PO) Take 1 tablet by mouth 2 (two) times daily.   Yes [provider]  clopidogrel (PLAVIX) 75 MG tablet Take 1 tablet by mouth once daily Patient taking differently: Take 75 mg by mouth daily. 07/28/21  Yes [name], MD  dapagliflozin propanediol (FARXIGA) 10 MG TABS tablet  Take 10 mg by mouth daily. 03/03/20  Yes [provider]  docusate sodium (COLACE) 100 MG capsule Take 100-200 mg by mouth daily as needed for mild constipation.   Yes [provider]  insulin detemir (LEVEMIR) 100 UNIT/ML injection Inject 54 Units into the skin in the morning.   Yes [provider]  insulin regular (NOVOLIN R) 100 units/mL injection Inject 15 Units into the skin daily after supper.   Yes [provider]  Multiple Vitamins-Minerals (MULTIVITAMIN WITH MINERALS) tablet Take 1 tablet by mouth daily.   Yes [provider]  omeprazole (PRILOSEC) 40 MG capsule Take 40 mg by mouth daily before breakfast.  06/28/12  Yes [provider]  Rivaroxaban (XARELTO) 15 MG TABS tablet Take 1 tablet (15 mg total) by mouth daily with supper. Patient taking differently: Take 15 mg by mouth daily. 09/08/20  Yes Weaver, Scott T,  PA-C  venlafaxine XR (EFFEXOR-XR) 150 MG 24 hr capsule Take 150 mg by mouth daily with breakfast. Take with 75 mg capsule to equal 225 mg daily   Yes [provider]  venlafaxine XR (EFFEXOR-XR) 75 MG 24 hr capsule Take 75 mg by mouth daily with breakfast. Take with 150 mg capsule to equal 225 mg daily   Yes [provider]  vitamin B-12 (CYANOCOBALAMIN) 500 MCG tablet Take 500 mcg by mouth every Monday.   Yes [provider]  nitroGLYCERIN (NITROSTAT) 0.4 MG SL tablet Place 1 tablet (0.4 mg total) under the tongue every 5 (five) minutes as needed for chest pain. Patient not taking: Reported on 10/16/2021 09/08/20 10/17/22  Richardson Dopp T, PA-C    Current Facility-Administered Medications  Medication Dose Route Frequency Provider Last Rate Last Admin   acetaminophen (TYLENOL) tablet 650 mg  650 mg Oral Q6H PRN Lenore Cordia, MD       Or   acetaminophen (TYLENOL) suppository 650 mg  650 mg Rectal Q6H PRN Lenore Cordia, MD       atorvastatin (LIPITOR) tablet 80 mg  80 mg Oral Daily Zada Finders R, MD   80 mg at 10/17/21 0852   dextrose 5 %-0.9 % sodium chloride infusion   Intravenous Continuous Wendee Beavers T, MD 65 mL/hr at 10/17/21 1013 New Bag at 10/17/21 1013   insulin aspart (novoLOG) injection 0-9 Units  0-9 Units Subcutaneous Q4H Zada Finders R, MD       ondansetron (ZOFRAN) tablet 4 mg  4 mg Oral Q6H PRN Lenore Cordia, MD       Or   ondansetron (ZOFRAN) injection 4 mg  4 mg Intravenous Q6H PRN Lenore Cordia, MD       pantoprazole (PROTONIX) EC tablet 40 mg  40 mg Oral Daily Zada Finders R, MD   40 mg at 10/17/21 0853   sodium chloride flush (NS) 0.9 % injection 3 mL  3 mL Intravenous Q12H Zada Finders R, MD   3 mL at 10/17/21 0141   venlafaxine XR (EFFEXOR-XR) 24 hr capsule 225 mg  225 mg Oral Q breakfast Zada Finders R, MD   225 mg at 10/17/21 0853    Allergies as of 10/16/2021 - Review Complete 10/16/2021  Allergen Reaction Noted   Buspirone hcl   08/19/2020   Metformin and related Diarrhea 01/14/2019   Ozempic (0.25 or 0.5 mg-dose) [semaglutide(0.25 or 0.'5mg'$ -dos)]  03/09/2021   Ultram [tramadol] Nausea And Vomiting 07/31/2012   Nickel Rash 01/07/2019   Pneumococcal vaccines Swelling and Rash 01/14/2019  Family History  Problem Relation Age of Onset   Alcohol abuse Mother    Cancer Father        prostate   Hypertension Father    Heart attack Paternal Grandfather    Stroke Neg Hx     Social History   Socioeconomic History   Marital status: Widowed    Spouse name: Not on file   Number of children: 3   Years of education: Not on file   Highest education level: High school graduate  Occupational History   Occupation: Air cabin crew  Tobacco Use   Smoking status: Unknown   Smokeless tobacco: Never  Vaping Use   Vaping Use: Never used  Substance and Sexual Activity   Alcohol use: No   Drug use: No   Sexual activity: Never  Other Topics Concern   Not on file  Social History Narrative   04/19/21 lives alone, dgtr lives close by   Social Determinants of Health   Financial Resource Strain: Not on file  Food Insecurity: Not on file  Transportation Needs: Not on file  Physical Activity: Not on file  Stress: Not on file  Social Connections: Not on file  Intimate Partner Violence: Not on file    Review of Systems: Gen: Denies any fever, chills, sweats, anorexia, fatigue, weakness, malaise, weight loss, and sleep disorder CV: Denies chest pain, angina, palpitations, syncope, orthopnea, PND, peripheral edema, and claudication. Resp: Denies dyspnea at rest, dyspnea with exercise, cough, sputum, wheezing, coughing up blood, and pleurisy. GU : Denies urinary burning, blood in urine, urinary frequency, urinary hesitancy, nocturnal urination, and urinary incontinence. MS: Denies joint pain, limitation of movement, and swelling, stiffness, low back pain, extremity pain. Denies muscle weakness,  cramps, atrophy.  Derm: Denies rash, itching, dry skin, hives, moles, warts, or unhealing ulcers.  Psych: Denies depression, anxiety, memory loss, suicidal ideation, hallucinations, paranoia, and confusion. Heme: Denies bruising, bleeding, and enlarged lymph nodes. Neuro:  Denies any headaches, dizziness, paresthesias. Endo:  Denies any problems with DM, thyroid, adrenal function.  Physical Exam: Vital signs in last 24 hours: Temp:  [97.8 F (36.6 C)-98.5 F (36.9 C)] 98.5 F (36.9 C) (07/23 0955) Pulse Rate:  [56-77] 56 (07/23 0955) Resp:  [16-18] 18 (07/23 0955) BP: (112-162)/(46-65) 150/59 (07/23 0955) SpO2:  [95 %-98 %] 95 % (07/23 0955) Weight:  [68 kg] 68 kg (07/22 2045) Last BM Date : 10/15/21  General:  Alert, well-developed, well-nourished, elderly female in NAD Head:  Normocephalic and atraumatic. Eyes:  Sclera clear, no icterus. Conjunctiva pink. Ears:  Normal auditory acuity. Nose:  No deformity, discharge, or lesions. Mouth:  No deformity or lesions. Oropharynx pink & moist. Neck:  Supple; no masses or thyromegaly. Chest:  Clear throughout to auscultation. No wheezes, crackles, or rhonchi. No acute distress. Heart:  Regular rate and rhythm; no murmurs, clicks, rubs, or gallops. Abdomen:  Soft, nontender and nondistended. No masses, hepatosplenomegaly or hernias noted. Normal bowel sounds, without guarding, and without rebound.   Rectal:  Per EDP dark blood, FOBT positive Msk:  Symmetrical without gross deformities. Normal posture. Pulses:  Normal pulses noted. Extremities:  Without clubbing or edema. Neurologic:  Alert and  oriented x4;  grossly normal neurologically. Skin:  Intact without significant lesions or rashes. Cervical Nodes:  No significant cervical adenopathy. Psych:  Alert and cooperative. Normal mood and affect.  Intake/Output from previous day: 07/22 0701 - 07/23 0700 In: 332 [I.V.:332] Out: 0  Intake/Output this shift: No intake/output data  recorded.  Previous Endoscopies:  Colonoscopy Oct 2022: sigmoid colon diverticulosis, otherwise normal.   Lab Results: Recent Labs    10/16/21 2138 10/17/21 0257 10/17/21 1024  WBC 6.7 8.1  --   HGB 13.2 13.1 12.2  HCT 40.5 39.3 37.6  PLT 204 201  --    BMET Recent Labs    10/16/21 2138 10/17/21 0257  NA 140 142  K 4.6 4.0  CL 107 108  CO2 26 28  GLUCOSE 337* 78  BUN 30* 27*  CREATININE 0.95 0.75  CALCIUM 9.2 8.9   LFT Recent Labs    10/16/21 2138  PROT 6.4*  ALBUMIN 3.3*  AST 23  ALT 21  ALKPHOS 70  BILITOT 0.4   PT/INR Recent Labs    10/16/21 2138  LABPROT 16.6*  INR 1.4*    Studies/Results: CT ABDOMEN PELVIS W CONTRAST  Result Date: 10/16/2021 CLINICAL DATA:  Lower GI bleed, rectal bleeding. EXAM: CT ABDOMEN AND PELVIS WITH CONTRAST TECHNIQUE: Multidetector CT imaging of the abdomen and pelvis was performed using the standard protocol following bolus administration of intravenous contrast. RADIATION DOSE REDUCTION: This exam was performed according to the departmental dose-optimization program which includes automated exposure control, adjustment of the mA and/or kV according to patient size and/or use of iterative reconstruction technique. CONTRAST:  160m OMNIPAQUE IOHEXOL 300 MG/ML  SOLN COMPARISON:  01/02/2019. FINDINGS: Lower chest: No acute abnormality. Hepatobiliary: No focal liver abnormality is seen. Fatty infiltration of the liver is noted. No biliary ductal dilatation. Stones are present within the gallbladder. Pancreas: Pancreatic atrophy. No pancreatic ductal dilatation or surrounding inflammatory changes. Spleen: Normal in size without focal abnormality. Adrenals/Urinary Tract: The adrenal glands are within normal limits. A cyst is present in the lower pole of the right kidney. Subcentimeter hypodensities are present in the kidneys bilaterally which are too small to further characterize. Rule out renal calculus or hydronephrosis. The bladder is  unremarkable. Stomach/Bowel: There is a small hiatal hernia. The stomach is within normal limits. No bowel obstruction, free air, or pneumatosis. Scattered diverticula are present along the colon without evidence of diverticulitis. No focal bowel wall thickening or surrounding inflammatory changes. The appendix is not visualized on exam. Vascular/Lymphatic: Aortic atherosclerosis. No enlarged abdominal or pelvic lymph nodes. Reproductive: The uterus is within normal limits. There is a cyst in the right adnexa measuring 5.5 cm, increased in size from 2020. No adnexal mass on the left. Other: No abdominopelvic ascites. A fat containing umbilical hernia is noted. Increased density is noted in the anterior abdominal wall bilaterally which is unchanged from 2020. Musculoskeletal: Degenerative changes are present in the thoracic spine. No acute osseous abnormality. IMPRESSION: 1. No acute intra-abdominal process. 2. Diverticulosis without diverticulitis. 3. Cystic structure in the right adnexa measuring 5.5 cm, decreased from 2020. Ultrasound is recommended for further evaluation on nonemergent follow-up. 4. Aortic atherosclerosis. 5. Remaining incidental findings as described above. Electronically Signed   By: LBrett FairyM.D.   On: 10/16/2021 23:25      LOS: 1 day   Bertrice Leder T. SFuller Plan MD  10/17/2021, 11:15 AM See [facility], to contact our on call provider

## 2021-10-17 NOTE — Hospital Course (Signed)
Rachel Santos is a 81 y.o. female with medical history significant for paroxysmal atrial fibrillation on Xarelto, severe aortic stenosis s/p TAVR, mitral and tricuspid valve disease s/p repair of both, mild CAD, history of CVA and severe left MCA stenosis on Plavix, CKD stage IIIa, T2DM, HTN, HLD, depression/anxiety, MCI who is admitted with acute lower GI bleeding.

## 2021-10-18 ENCOUNTER — Ambulatory Visit: Payer: Medicare Other | Admitting: Psychiatry

## 2021-10-18 DIAGNOSIS — K625 Hemorrhage of anus and rectum: Secondary | ICD-10-CM | POA: Diagnosis not present

## 2021-10-18 DIAGNOSIS — E118 Type 2 diabetes mellitus with unspecified complications: Secondary | ICD-10-CM | POA: Diagnosis not present

## 2021-10-18 DIAGNOSIS — K922 Gastrointestinal hemorrhage, unspecified: Secondary | ICD-10-CM | POA: Diagnosis not present

## 2021-10-18 DIAGNOSIS — I480 Paroxysmal atrial fibrillation: Secondary | ICD-10-CM | POA: Diagnosis not present

## 2021-10-18 DIAGNOSIS — Z8673 Personal history of transient ischemic attack (TIA), and cerebral infarction without residual deficits: Secondary | ICD-10-CM | POA: Diagnosis not present

## 2021-10-18 LAB — GLUCOSE, CAPILLARY
Glucose-Capillary: 145 mg/dL — ABNORMAL HIGH (ref 70–99)
Glucose-Capillary: 187 mg/dL — ABNORMAL HIGH (ref 70–99)
Glucose-Capillary: 203 mg/dL — ABNORMAL HIGH (ref 70–99)
Glucose-Capillary: 197 mg/dL — ABNORMAL HIGH (ref 70–99)
Glucose-Capillary: 161 mg/dL — ABNORMAL HIGH (ref 70–99)

## 2021-10-18 LAB — RENAL FUNCTION PANEL
Albumin: 3.1 g/dL — ABNORMAL LOW (ref 3.5–5.0)
Anion gap: 5 (ref 5–15)
BUN: 16 mg/dL (ref 8–23)
CO2: 26 mmol/L (ref 22–32)
Calcium: 8.6 mg/dL — ABNORMAL LOW (ref 8.9–10.3)
Chloride: 110 mmol/L (ref 98–111)
Creatinine, Ser: 0.72 mg/dL (ref 0.44–1.00)
GFR, Estimated: >60 mL/min (ref >60)
Glucose, Bld: 142 mg/dL — ABNORMAL HIGH (ref 70–99)
Phosphorus: 3.5 mg/dL (ref 2.5–4.6)
Potassium: 4.4 mmol/L (ref 3.5–5.1)
Sodium: 141 mmol/L (ref 135–145)

## 2021-10-18 LAB — CBC
HCT: 37.3 % (ref 36.0–46.0)
Hemoglobin: 12.4 g/dL (ref 12.0–15.0)
MCH: 30.7 pg (ref 26.0–34.0)
MCHC: 33.2 g/dL (ref 30.0–36.0)
MCV: 92.3 fL (ref 80.0–100.0)
Platelets: 186 10*3/uL (ref 150–400)
RBC: 4.04 MIL/uL (ref 3.87–5.11)
RDW: 12.9 % (ref 11.5–15.5)
WBC: 6.7 10*3/uL (ref 4.0–10.5)
nRBC: 0 % (ref 0.0–0.2)

## 2021-10-18 LAB — MAGNESIUM: Magnesium: 2 mg/dL (ref 1.7–2.4)

## 2021-10-18 MED ORDER — INSULIN ASPART 100 UNIT/ML IJ SOLN: 0.0000 [IU] | Freq: Every day | INTRAMUSCULAR | Status: DC

## 2021-10-18 MED ORDER — INSULIN ASPART 100 UNIT/ML IJ SOLN
0.0000 [IU] | Freq: Three times a day (TID) | INTRAMUSCULAR | Status: DC
  Administered 2021-10-18 – 2021-10-19 (×2): 2 [IU] via SUBCUTANEOUS
  Administered 2021-10-19: 5 [IU] via SUBCUTANEOUS

## 2021-10-18 NOTE — Evaluation (Signed)
Physical Therapy Evaluation Patient Details Name: Rachel Santos MRN: [id number] DOB: April 11, 1940 Today's Date: 10/18/2021  History of Present Illness  Patient is 81 y.o. female presented to ED with painless hematochezia that she describes as long blood clot. 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    Clinical Impression  Rachel Santos is 81 y.o. female admitted with above HPI and diagnosis. Patient is currently limited by functional impairments below (see PT problem list). Patient lives alone but her daughter is in the same apartment complex and pt is independent with intermittent use of SPC at baseline for household mobility. Currently she requires min guard for safety with transfers and gait and is slightly unsteady due to Rt sided weakness. Patient will benefit from continued skilled PT interventions to address impairments and progress independence with mobility, recommending HHPT with 24/7 assist from family at first and gradual reduction in supervision as pt regains strength at home. Discussed dc rec with pt's daughter and family agreeable. Acute PT will follow and progress as able.        Recommendations for follow up therapy are one component of a multi-disciplinary discharge planning process, led by the attending physician.  Recommendations may be updated based on patient status, additional functional criteria and insurance authorization.  Follow Up Recommendations Home health PT      Assistance Recommended at Discharge Intermittent Supervision/Assistance  Patient can return home with the following  A little help with walking and/or transfers;A little help with bathing/dressing/bathroom;Assistance with cooking/housework;Direct supervision/assist for medications  management;Assist for transportation;Help with stairs or ramp for entrance    Equipment Recommendations None recommended by PT  Recommendations for Other Services       Functional Status Assessment Patient has had a recent decline in their functional status and demonstrates the ability to make significant improvements in function in a reasonable and predictable amount of time.     Precautions / Restrictions Precautions Precautions: Fall Restrictions Weight Bearing Restrictions: No      Mobility  Bed Mobility               General bed mobility comments: OOB in recliner    Transfers Overall transfer level: Needs assistance Equipment used: Straight cane, None Transfers: Sit to/from Stand Sit to Stand: Min guard           General transfer comment: pt steady with power up, no cues needed for safe technique. pt prefers use of bil UE to initiate.    Ambulation/Gait Ambulation/Gait assistance: Min guard Gait Distance (Feet): 150 Feet Assistive device: None, Straight cane Gait Pattern/deviations: Step-through pattern, Decreased stride length, Trunk flexed Gait velocity: decr     General Gait Details: slightly flexed posture, overall low foot clearance with swing phase, pt drifting Rt and decresaed dorsiflexion noted on Rt.  Stairs            Wheelchair Mobility    Modified Rankin (Stroke Patients Only)       Balance Overall balance assessment: Needs assistance Sitting-balance support: Feet supported Sitting balance-Leahy Scale: Good     Standing balance support: During functional activity, Single extremity supported, No upper extremity supported Standing balance-Leahy Scale: Fair  Pertinent Vitals/Pain Pain Assessment Pain Assessment: No/denies pain    Home Living Family/patient expects to be discharged to:: Private residence Living Arrangements: Alone Available Help at Discharge: Family;Available 24  hours/day Type of Home: Apartment Home Access: Level entry       Home Layout: One level Home Equipment: Conservation officer, nature (2 wheels);Cane - quad Additional Comments: dtr lives in neighboring apt and works here at [employer]. she is able to check in on her every day after work. pt typically independent in apartment and does her own cleaning and cooking. dtr does her food shopping and drives her to appointments.    Prior Function Prior Level of Function : Independent/Modified Independent             Mobility Comments: ambulated with SPC and no device ADLs Comments: daughter helps with picking up groceries and driving.     Hand Dominance   Dominant Hand: Right    Extremity/Trunk Assessment   Upper Extremity Assessment Upper Extremity Assessment: Defer to OT evaluation    Lower Extremity Assessment Lower Extremity Assessment: RLE deficits/detail;LLE deficits/detail (5xS<>S: 18 seconds, no UE use for power up) RLE Deficits / Details: 3+/5 for hip flexion, 4-/5 for knee felxion/extension and dorsiflexion RLE Sensation: WNL RLE Coordination: WNL LLE Deficits / Details: 4/5 for hip flexion, knee felxion/extension and dorsiflexion LLE Sensation: WNL LLE Coordination: WNL    Cervical / Trunk Assessment Cervical / Trunk Assessment: Normal  Communication   Communication: HOH  Cognition Arousal/Alertness: Awake/alert Behavior During Therapy: WFL for tasks assessed/performed Overall Cognitive Status: Within Functional Limits for tasks assessed                                 General Comments: HOH        General Comments      Exercises     Assessment/Plan    PT Assessment Patient needs continued PT services  PT Problem List Decreased strength;Decreased activity tolerance;Decreased balance;Decreased mobility;Decreased safety awareness       PT Treatment Interventions DME instruction;Gait training;Stair training;Functional mobility training;Therapeutic  activities;Therapeutic exercise;Balance training;Patient/family education;Neuromuscular re-education    PT Goals (Current goals can be found in the Care Plan section)  Acute Rehab PT Goals Patient Stated Goal: get home PT Goal Formulation: With patient/family Time For Goal Achievement: 11/01/21 Potential to Achieve Goals: Good    Frequency Min 3X/week     Co-evaluation               AM-PAC PT "6 Clicks" Mobility  Outcome Measure Help needed turning from your back to your side while in a flat bed without using bedrails?: A Little Help needed moving from lying on your back to sitting on the side of a flat bed without using bedrails?: A Little Help needed moving to and from a bed to a chair (including a wheelchair)?: A Little Help needed standing up from a chair using your arms (e.g., wheelchair or bedside chair)?: A Little Help needed to walk in hospital room?: A Little Help needed climbing 3-5 steps with a railing? : A Little 6 Click Score: 18    End of Session Equipment Utilized During Treatment: Gait belt Activity Tolerance: Patient tolerated treatment well Patient left: in chair;with call bell/phone within reach;with family/visitor present Nurse Communication: Mobility status PT Visit Diagnosis: Unsteadiness on feet (R26.81);Muscle weakness (generalized) (M62.81);Difficulty in walking, not elsewhere classified (R26.2)    Time: 1610-9604 PT Time Calculation (min) (ACUTE ONLY): 21  min   Charges:   PT Evaluation $PT Eval Low Complexity: 1 Low          Gwynneth Albright PT, DPT Acute Rehabilitation Services Office [phone] Pager [phone]  10/18/21 10:00 AM

## 2021-10-18 NOTE — Consult Note (Addendum)
Cardiology Consultation:   Patient ID: Rachel Santos MRN: [id number]; DOB: [birth year]  Admit date: 10/16/2021 Date of Consult: 10/18/2021  PCP:  Lajean Manes, MD   [facility] Cardiologist:  Lauree Chandler, MD     Patient Profile:   Rachel Santos is a 81 y.o. 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 Cyndia Skeeters.  History of Present Illness:   Rachel Santos established with cardiology in 2014 with PAF and mitral valve disease. She underwent TEE-guided cardioversion with successful restoration of sinus rhythm. She underwent mitral valve and tricuspid valve repair in 2014 as well as a MAZE procedure for PAF. She has been anticoagulated with 15 mg xarelto. She was referred to structural heart team for aortic stenosis. Heart catheterization 2020 with nonobstructive disease. She underwent TAVR procedure 57/01/77 that was complicated by right groin hematoma requiring surgical repair and cardioembolic CVA. Stroke workup completed and neurology felt her stroke was due to intracranial atherosclerotic disease. ASA was added to xarelto. Follow up heart monitor showed sinus rhythm with PACs. She underwent treatment of left MCA stenosis and was placed on brilinta. Last echo 01/2020 with trivial PVL of the TAVR and good function of the MV and TV. She was last seen by Dr. Angelena Form 04/28/21. Brilinta has since been transitioned to plavix in the setting of xarelto.   She presented to [facility] with approximately 12 hrs of rectal bleeding and lower abdominal cramping.  She reported some weakness but no nausea vomiting or diarrhea.  She reported bright red blood per rectum starting at noon  10/16/2021.  She was admitted to the hospital and Xarelto and Plavix held.  Fortunately hemoglobin and hematocrit remained stable, platelets 204K.  CT abdomen/pelvis with contrast was negative for acute injury or abdominal process.  Diverticulosis without diverticulitis was noted.  There was a cystic structure in the right adnexa measuring 5.5 cm, which was decreased from imaging in 2020.  She was treated with IV Protonix and GI was consulted.  Since admission, she has had no further rectal bleeding.  Cardiology was consulted for recommendations regarding further anticoagulation.  In my review of her EKGs, it appears she has maintained sinus rhythm since 2015. However, given her prior stroke and elevated stroke risk, she has been continued on [name].   During my interview, she recalls she was very symptomatic with prior Afib, but has not had palpitations since 2014. No syncope or recent falls. She describes a BM in which she passed a very large clot and continued bleeding. She has had no further bleeding.  She has no cardiac complaints.  No EKG on file for this admission. Telemetry concerning for fib/flutter. EKG obtained and shows regular rhythm with baseline artifact.    Past Medical History:  Diagnosis Date   Anxiety    Arthritis    knees   Atrial fibrillation (HCC)    CAD (coronary artery disease)    a. pre TAVR cath 2020 mild nonobstructive CAD in the LAD and CX with moderate nononstructive disease in the mid-distal RCA and PDA.   Cancer ([place])    left breast   Cataracts, bilateral    CKD (chronic kidney disease), stage III (  [place])    CVA (cerebral vascular accident) ([place])    Depression    Diabetes mellitus    DVT (deep venous thrombosis) (HCC)    hx of   Dyspnea    with exertion   Full dentures    GERD (gastroesophageal reflux disease)    Groin hematoma    Hearing aid worn    B/L   History of TIA (transient ischemic attack)    mini stroke with vision problems   Hypercholesterolemia     Hypertension    Hypertensive nephropathy    MCI (mild cognitive impairment)    Memory loss    Middle cerebral artery stenosis, left    Mitral regurgitation    Paroxysmal atrial fibrillation (HCC)    Pneumonia    PONV (postoperative nausea and vomiting)    Premature atrial contraction    S/P Maze operation for atrial fibrillation 10/11/2012   Complete bilateral atrial lesion set using bipolar radiofrequency and cryothermy ablation with clipping of LA appendage   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   74m Sorin Memo 3D ring annuloplasty with 26 mm Edwards mc3 tricuspid ring annuloplasty    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   s/p TAVR with a 23 Edwards Sapien 3 Ultra THV via the TF approach   Thyroid nodule    Tricuspid regurgitation    Wears glasses     Past Surgical History:  Procedure Laterality Date   ARTERY REPAIR Right 01/22/2019   Procedure: Right Common Femoral Artery Repair;  Surgeon: DAngelia Mould MD;  [facility]  Service: Vascular;  Laterality: Right;   CARDIAC CATHETERIZATION  09-05-12   CARDIOVERSION N/A 07/31/2012   Procedure: CARDIOVERSION;  Surgeon: PJosue Hector MD;  [facility]  Service: Cardiovascular;  Laterality: N/A;   CATARACT EXTRACTION W/ INTRAOCULAR LENS  IMPLANT, BILATERAL     CESAREAN SECTION     x 2   COLONOSCOPY WITH PROPOFOL N/A 11/28/2014   Procedure: COLONOSCOPY WITH PROPOFOL;  Surgeon: PCarol Ada MD;  [facility];  Service: Endoscopy;  Laterality: N/A;   COLONOSCOPY WITH PROPOFOL N/A 01/22/2021   Procedure: COLONOSCOPY WITH PROPOFOL;  Surgeon: HCarol Ada MD;  [facility];  Service: Endoscopy;  Laterality: N/A;   DILATION AND CURETTAGE OF UTERUS     FEMORAL ARTERY EXPLORATION Right 01/22/2019   Procedure: RIGHT GROIN EXPLORATION with evacuation of hematoma;  Surgeon: DAngelia Mould MD;  [facility]  Service: Vascular;  Laterality: Right;   FLEXIBLE  SIGMOIDOSCOPY N/A 12/18/2015   Procedure: FLEXIBLE SIGMOIDOSCOPY;  Surgeon: PCarol Ada MD;  [facility];  Service: Endoscopy;  Laterality: N/A;   INTRAOPERATIVE TRANSESOPHAGEAL ECHOCARDIOGRAM N/A 10/11/2012   Procedure: INTRAOPERATIVE TRANSESOPHAGEAL ECHOCARDIOGRAM;  Surgeon: CRexene Alberts MD;  [facility]  Service: Open Heart Surgery;  Laterality: N/A;   IR ANGIO INTRA EXTRACRAN SEL COM CAROTID INNOMINATE BILAT MOD SED  05/21/2019   IR ANGIO VERTEBRAL SEL VERTEBRAL BILAT MOD SED  05/21/2019   MASTECTOMY Left 1982   MAZE N/A 10/11/2012   Procedure: MAZE;  Surgeon: CRexene Alberts MD;  [facility]  Service: Open Heart Surgery;  Laterality: N/A;   MITRAL VALVE REPAIR N/A 10/11/2012   Procedure: MITRAL VALVE REPAIR (MVR);  Surgeon: CRexene Alberts MD;  [facility]  Service: Open Heart Surgery;  Laterality: N/A;   MULTIPLE TOOTH EXTRACTIONS     RADIOLOGY WITH ANESTHESIA N/A 06/19/2019   Procedure: STENTING;  Surgeon: DLuanne Bras MD;  Location: [facility];  Service: Radiology;  Laterality: N/A;   RIGHT HEART CATH AND CORONARY ANGIOGRAPHY N/A 12/24/2018   Procedure: RIGHT HEART CATH AND CORONARY ANGIOGRAPHY;  Surgeon: [name], MD;  Location: [facility];  Service: Cardiovascular;  Laterality: N/A;   TEE WITHOUT CARDIOVERSION N/A 07/31/2012   Procedure: TRANSESOPHAGEAL ECHOCARDIOGRAM (TEE);  Surgeon: Josue Hector, MD;  Location: [facility];  Service: Cardiovascular;  Laterality: N/A;   TEE WITHOUT CARDIOVERSION N/A 01/22/2019   Procedure: TRANSESOPHAGEAL ECHOCARDIOGRAM (TEE);  Surgeon: [name], MD;  Location: [facility];  Service: Open Heart Surgery;  Laterality: N/A;   TRANSCATHETER AORTIC VALVE REPLACEMENT, TRANSFEMORAL N/A 01/22/2019   Procedure: TRANSCATHETER AORTIC VALVE REPLACEMENT, TRANSFEMORAL;  Surgeon: [name], MD;  Location: [facility];  Service: Open Heart Surgery;  Laterality: N/A;    TRICUSPID VALVE REPLACEMENT N/A 10/11/2012   Procedure: TRICUSPID VALVE REPAIR;  Surgeon: Rexene Alberts, MD;  Location: [facility];  Service: Open Heart Surgery;  Laterality: N/A;   TUBAL LIGATION       Home Medications:  Prior to Admission medications   Medication Sig Start Date End Date Taking? Authorizing Provider  acetaminophen (TYLENOL) 500 MG tablet Take 1,000 mg by mouth every 6 (six) hours as needed for moderate pain or headache.   Yes [provider]  atorvastatin (LIPITOR) 80 MG tablet Take 1 tablet by mouth once daily 10/06/21  Yes McAlhany, Annita Brod, MD  Calcium Carb-Cholecalciferol (CALCIUM 600 + D PO) Take 1 tablet by mouth 2 (two) times daily.   Yes [provider]  clopidogrel (PLAVIX) 75 MG tablet Take 1 tablet by mouth once daily Patient taking differently: Take 75 mg by mouth daily. 07/28/21  Yes [name], MD  dapagliflozin propanediol (FARXIGA) 10 MG TABS tablet Take 10 mg by mouth daily. 03/03/20  Yes [provider]  docusate sodium (COLACE) 100 MG capsule Take 100-200 mg by mouth daily as needed for mild constipation.   Yes [provider]  insulin detemir (LEVEMIR) 100 UNIT/ML injection Inject 54 Units into the skin in the morning.   Yes [provider]  insulin regular (NOVOLIN R) 100 units/mL injection Inject 15 Units into the skin daily after supper.   Yes [provider]  Multiple Vitamins-Minerals (MULTIVITAMIN WITH MINERALS) tablet Take 1 tablet by mouth daily.   Yes [provider]  omeprazole (PRILOSEC) 40 MG capsule Take 40 mg by mouth daily before breakfast.  06/28/12  Yes [provider]  Rivaroxaban (XARELTO) 15 MG TABS tablet Take 1 tablet (15 mg total) by mouth daily with supper. Patient taking differently: Take 15 mg by mouth daily. 09/08/20  Yes Weaver, Scott T, PA-C  venlafaxine XR (EFFEXOR-XR) 150 MG 24 hr capsule Take 150 mg by mouth daily with breakfast. Take with 75 mg  capsule to equal 225 mg daily   Yes [provider]  venlafaxine XR (EFFEXOR-XR) 75 MG 24 hr capsule Take 75 mg by mouth daily with breakfast. Take with 150 mg capsule to equal 225 mg daily   Yes [provider]  vitamin B-12 (CYANOCOBALAMIN) 500 MCG tablet Take 500 mcg by mouth every Monday.   Yes [provider]  nitroGLYCERIN (NITROSTAT) 0.4 MG SL tablet Place 1 tablet (0.4 mg total) under the tongue every 5 (five) minutes as needed for chest pain. Patient not taking: Reported on 10/16/2021 09/08/20 10/17/22  Liliane Shi, PA-C    Inpatient Medications: Scheduled Meds:  atorvastatin  80 mg Oral Daily   insulin aspart  0-5 Units Subcutaneous QHS   insulin aspart  0-9 Units Subcutaneous TID WC   pantoprazole  40 mg Oral Daily   sodium chloride flush  3 mL Intravenous Q12H   venlafaxine XR  225 mg Oral Q breakfast   Continuous Infusions:  PRN Meds: acetaminophen **OR** acetaminophen, ondansetron **OR** ondansetron (ZOFRAN) IV  Allergies:    Allergies  Allergen Reactions   Buspirone Hcl     lethargic/tired   Metformin And Related Diarrhea   Ozempic (0.25 Or 0.5 Mg-Dose) [Semaglutide(0.25 Or 0.'5mg'$ -Dos)]     Other reaction(s): nausea   Ultram [Tramadol] Nausea And Vomiting   Nickel Rash    Pt unable to wear jewelry made of nickel.   Pneumococcal Vaccines Swelling and Rash    At injection set    Social History:   Social History   Socioeconomic History   Marital status: Widowed    Spouse name: Not on file   Number of children: 3   Years of education: Not on file   Highest education level: High school graduate  Occupational History   Occupation: Air cabin crew  Tobacco Use   Smoking status: Unknown   Smokeless tobacco: Never  Vaping Use   Vaping Use: Never used  Substance and Sexual Activity   Alcohol use: No   Drug use: No   Sexual activity: Never  Other Topics Concern   Not on file  Social History Narrative    04/19/21 lives alone, dgtr lives close by   Social Determinants of Health   Financial Resource Strain: Not on file  Food Insecurity: Not on file  Transportation Needs: Not on file  Physical Activity: Not on file  Stress: Not on file  Social Connections: Not on file  Intimate Partner Violence: Not on file    Family History:    Family History  Problem Relation Age of Onset   Alcohol abuse Mother    Cancer Father        prostate   Hypertension Father    Heart attack Paternal Grandfather    Stroke Neg Hx      ROS:  Please see the history of present illness.   All other ROS reviewed and negative.     Physical Exam/Data:   Vitals:   10/17/21 1401 10/17/21 2031 10/18/21 0526 10/18/21 1411  BP: (!) 156/65 134/67 (!) 150/58 (!) 142/59  Pulse: 60 (!) 55 (!) 58 (!) 55  Resp: '18 18 18 18  '$ Temp: 98.5 F (36.9 C) 98.1 F (36.7 C) 98.8 F (37.1 C) 98.2 F (36.8 C)  TempSrc: Oral Oral Oral Oral  SpO2: 99% 98% 96% 99%  Weight:      Height:        Intake/Output Summary (Last 24 hours) at 10/18/2021 1607 Last data filed at 10/18/2021 1400 Gross per 24 hour  Intake 1970.67 ml  Output 2050 ml  Net -79.33 ml      10/16/2021    8:45 PM 04/28/2021    8:23 AM 04/19/2021    9:09 AM  Last 3 Weights  Weight (lbs) 150 lb 147 lb 9.6 oz 149 lb  Weight (kg) 68.04 kg 66.951 kg 67.586 kg     Body mass index is 24.21 kg/m.  General:  Well nourished, well developed, in no acute distress HEENT: normal Neck: no JVD Vascular: No carotid bruits; Distal pulses 2+ bilaterally Cardiac:  normal S1, S2; RRR; no murmur  Lungs:  clear  to auscultation bilaterally, no wheezing, rhonchi or rales  Abd: soft, nontender, no hepatomegaly  Ext: no edema Musculoskeletal:  No deformities, BUE and BLE strength normal and equal Skin: warm and dry  Neuro:  CNs 2-12 intact, no focal abnormalities noted Psych:  Normal affect   EKG:  The EKG was personally reviewed and demonstrates:  suspect this is sinus  bradycardia with baseline artifact HR 54 Telemetry:  Telemetry was personally reviewed and demonstrates:  regular rhythm with artifact, p waves present  Relevant CV Studies:  Echo 01/30/20:  1. Left ventricular ejection fraction, by estimation, is 60 to 65%. The  left ventricle has normal function. The left ventricle has no regional  wall motion abnormalities. There is mild left ventricular hypertrophy.  Left ventricular diastolic parameters  are consistent with Grade II diastolic dysfunction (pseudonormalization).   2. Right ventricular systolic function is normal. The right ventricular  size is normal. Tricuspid regurgitation signal is inadequate for assessing  PA pressure.   3. Left atrial size was moderately dilated.   4. Status post tricuspid valve repair. Trivial tricuspid regurgitation.  No significant stenosis with mean gradient 2 mmHg.   5. Status post mitral valve repair. Trivial mitral regurgitation. Mean  gradient 4 mmHg, mild mitral stenosis.   6. Bioprosthetic aortic valve s/p TAVR (23 mm Edwards Sapien). Trivial  peri-valvular leakage. Mean gradient 16 mmHg, mildly elevated.   7. The inferior vena cava is normal in size with greater than 50%  respiratory variability, suggesting right atrial pressure of 3 mmHg.    RIGHT HEART CATH AND CORONARY ANGIOGRAPHY 12/24/2018 Narrative  Mid RCA lesion is 50% stenosed.  RPDA lesion is 50% stenosed.  Prox RCA lesion is 30% stenosed.  Mid Cx lesion is 20% stenosed.  Ost LAD to Prox LAD lesion is 40% stenosed.  Mid LAD lesion is 30% stenosed. 1. Mild non-obstructive disease in the LAD and Circumflex 2. Moderate non-obstructive disease in the mid and distal RCA, PDA. 3. Severe aortic stenosis by echo. Unable to cross the aortic valve in the cath lab from the radial approach. Recommendations: Continue workup for TAVR. Medical management of non-obstructive CAD.  Laboratory Data:  High Sensitivity Troponin:  No results for  input(s): "TROPONINIHS" in the last 720 hours.   Chemistry Recent Labs  Lab 10/16/21 2138 10/17/21 0257 10/18/21 0438  NA 140 142 141  K 4.6 4.0 4.4  CL 107 108 110  CO2 '26 28 26  '$ GLUCOSE 337* 78 142*  BUN 30* 27* 16  CREATININE 0.95 0.75 0.72  CALCIUM 9.2 8.9 8.6*  MG  --   --  2.0  GFRNONAA 60* >60 >60  ANIONGAP '7 6 5    '$ Recent Labs  Lab 10/16/21 2138 10/18/21 0438  PROT 6.4*  --   ALBUMIN 3.3* 3.1*  AST 23  --   ALT 21  --   ALKPHOS 70  --   BILITOT 0.4  --    Lipids No results for input(s): "CHOL", "TRIG", "HDL", "LABVLDL", "LDLCALC", "CHOLHDL" in the last 168 hours.  Hematology Recent Labs  Lab 10/16/21 2138 10/17/21 0257 10/17/21 1024 10/17/21 1459 10/18/21 0438  WBC 6.7 8.1  --   --  6.7  RBC 4.42 4.29  --   --  4.04  HGB 13.2 13.1 12.2 12.6 12.4  HCT 40.5 39.3 37.6 39.2 37.3  MCV 91.6 91.6  --   --  92.3  MCH 29.9 30.5  --   --  30.7  MCHC 32.6 33.3  --   --  33.2  RDW 12.9 13.1  --   --  12.9  PLT 204 201  --   --  186   Thyroid No results for input(s): "TSH", "FREET4" in the last 168 hours.  BNPNo results for input(s): "BNP", "PROBNP" in the last 168 hours.  DDimer No results for input(s): "DDIMER" in the last 168 hours.   Radiology/Studies:  CT ABDOMEN PELVIS W CONTRAST  Result Date: 10/16/2021 CLINICAL DATA:  Lower GI bleed, rectal bleeding. EXAM: CT ABDOMEN AND PELVIS WITH CONTRAST TECHNIQUE: Multidetector CT imaging of the abdomen and pelvis was performed using the standard protocol following bolus administration of intravenous contrast. RADIATION DOSE REDUCTION: This exam was performed according to the departmental dose-optimization program which includes automated exposure control, adjustment of the mA and/or kV according to patient size and/or use of iterative reconstruction technique. CONTRAST:  112m OMNIPAQUE IOHEXOL 300 MG/ML  SOLN COMPARISON:  01/02/2019. FINDINGS: Lower chest: No acute abnormality. Hepatobiliary: No focal liver  abnormality is seen. Fatty infiltration of the liver is noted. No biliary ductal dilatation. Stones are present within the gallbladder. Pancreas: Pancreatic atrophy. No pancreatic ductal dilatation or surrounding inflammatory changes. Spleen: Normal in size without focal abnormality. Adrenals/Urinary Tract: The adrenal glands are within normal limits. A cyst is present in the lower pole of the right kidney. Subcentimeter hypodensities are present in the kidneys bilaterally which are too small to further characterize. Rule out renal calculus or hydronephrosis. The bladder is unremarkable. Stomach/Bowel: There is a small hiatal hernia. The stomach is within normal limits. No bowel obstruction, free air, or pneumatosis. Scattered diverticula are present along the colon without evidence of diverticulitis. No focal bowel wall thickening or surrounding inflammatory changes. The appendix is not visualized on exam. Vascular/Lymphatic: Aortic atherosclerosis. No enlarged abdominal or pelvic lymph nodes. Reproductive: The uterus is within normal limits. There is a cyst in the right adnexa measuring 5.5 cm, increased in size from 2020. No adnexal mass on the left. Other: No abdominopelvic ascites. A fat containing umbilical hernia is noted. Increased density is noted in the anterior abdominal wall bilaterally which is unchanged from 2020. Musculoskeletal: Degenerative changes are present in the thoracic spine. No acute osseous abnormality. IMPRESSION: 1. No acute intra-abdominal process. 2. Diverticulosis without diverticulitis. 3. Cystic structure in the right adnexa measuring 5.5 cm, decreased from 2020. Ultrasound is recommended for further evaluation on nonemergent follow-up. 4. Aortic atherosclerosis. 5. Remaining incidental findings as described above. Electronically Signed   By: LBrett FairyM.D.   On: 10/16/2021 23:25     Assessment and Plan:   PAF  - has maintained sinus rhythm following DCCV and MAZE in  2014 - will obtain EKG for records - appears to be sinus bradycardia with baseline artifact   Chronic anticoagulation Pt has been maintained on low dose xarelto for PAF. She has maintained sinus rhythm.  Given high stroke risk, would prefer to resume O[place]when safe to do so from a bleeding standpoint. May consider challenging with xarelto while in the hospital. - discussed risks and benefits with patient and daughter at bedside   Hx of CVA Will defer antiplatelet decisions to neurology.    Acute GI bleed No further GI bleeding, H/H stable Per GI   Risk Assessment/Risk Scores:     CHA2DS2-VASc Score = 8   This indicates a 10.8% annual risk of stroke. The patient's score is based upon: CHF History: 0 HTN History: 1 Diabetes History: 1 Stroke History: 2 Vascular Disease History: 1 Age Score:  2 Gender Score: 1      For questions or updates, please contact [facility] Please consult [email or web address] for contact info under    Signed, Ledora Bottcher, [place]  10/18/2021 4:07 PM

## 2021-10-18 NOTE — Progress Notes (Addendum)
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  On admission, vitals stable.  Hgb 13.2.  Hemoccult positive.  CT abdomen and pelvis without significant finding other than diverticulosis and known right adnexal cystic structure measuring about 5.5 cm which seems to be decreased compared to prior imaging in 2020.  The next day, H&H remained stable.  No further GI bleed.  GI consulted.    Subjective: Seen and examined earlier this morning.  No major events overnight of this morning.  Has not had a bowel movement yet.  Otherwise, no complaints.  Denies chest pain, dyspnea, GI or UTI symptoms.  Objective: Vitals:   10/17/21 0955 10/17/21 1401 10/17/21 2031 10/18/21 0526  BP: (!) 150/59 (!) 156/65 134/67 (!) 150/58  Pulse: (!) 56 60 (!) 55 (!) 58  Resp: '18 18 18 18  '$ Temp: 98.5 F (36.9 C) 98.5 F (36.9 C) 98.1 F (36.7 C) 98.8 F (37.1 C)  TempSrc: Oral Oral Oral Oral  SpO2: 95% 99% 98% 96%  Weight:      Height:        Examination:  GENERAL: Sitting on bedside chair. HEENT: MMM.  Vision and hearing grossly intact.  NECK: Supple.  No apparent JVD.  RESP:  No IWOB.  Fair aeration bilaterally. CVS:  RRR. Heart sounds normal.  ABD/GI/GU: BS+. Abd soft, NTND.  MSK/EXT:  Moves extremities. No apparent deformity. No edema.  SKIN: no apparent skin lesion or wound NEURO: Awake, alert and oriented appropriately.  No apparent focal neuro  deficit. PSYCH: Calm. Normal affect.   Procedures:  None  Microbiology summarized: None  Assessment and plan: Principal Problem:   Acute GI bleeding Active Problems:   PAF (paroxysmal atrial fibrillation) (HCC)   History of CVA (cerebrovascular accident)   Type 2 diabetes mellitus with complication, with long-term current use of insulin (HCC)   Chronic kidney disease, stage 3a ([place])   Depression with anxiety   Abnormal finding on imaging   Simple adnexal cyst greater than 5 cm in diameter in premenopausal patient   Orthostatic hypotension   Pyuria   Microscopic hematuria  Painless hematochezia: Likely diverticular bleed but she has slightly elevated BUN to creatinine ratio as well.  Hemoccult positive.  H&H relatively stable.  No further GI bleed here.  Followed by [facility].  Colonoscopy in 12/2020 with diverticulosis but no mention of hemorrhoids.  Recent Labs    10/16/21 2138 10/17/21 0257 10/17/21 1024 10/17/21 1459 10/18/21 0438  HGB 13.2 13.1 12.2 12.6 12.4  -GI recs: CLD, hold Xarelto and Plavix, trend CBC, continue PPI and [facility] to see patient on 7/24. -Sent secure chat to [place] providers.   Addendum -Dr. Collene Mares with [facility] recommended advancing diet as tolerated and talking to card about anticoagulation -Cardiology consulted, and will see patient on 7/25 -Advanced diet to full liquid  Paroxysmal A-fib: In sinus rhythm and rate controlled without meds.  On Xarelto for anticoagulation. -Xarelto on hold  due to GI bleed.   History of CVA/left MCA stenosis: On Plavix at home. -Continue holding Plavix in setting of GI bleed -Continue home Lipitor  Chronic orthostatic hypotension: Orthostatic vitals positive.  She is not on antihypertensive meds other than Farxiga. -Encourage wearing TED hose and staying upright -Advised stepwise approach when she gets up and move -PT/OT-HH PT recommended.   Uncontrolled IDDM-2 with hyperglycemia, CKD 3A and  hyperlipidemia: A1c 8.3%. Recent Labs  Lab 10/17/21 1611 10/17/21 2124 10/18/21 0401 10/18/21 0758 10/18/21 1154  GLUCAP 185* 235* 145* 187* 203*  -Hold home Farxiga -Increase SSI to sensitive. -Continue statin.  Pyuria/microscopic hematuria: No UTI symptoms.  Hematuria likely from anticoagulation and Plavix. -Monitor  CKD-3A: Stable. Recent Labs    01/13/21 1418 10/16/21 2138 10/17/21 0257 10/18/21 0438  BUN  --  30* 27* 16  CREATININE 0.90 0.95 0.75 0.72  -Continue monitoring  Right adnexal cyst: 5.5 cm right adnexal cystic structure noted on CT but it decreased in size compared to prior CT. -Nonemergent follow-up US is recommended. Patient/family are aware of findings.   Anxiety and depression: Stable. -Continue Effexor XR.  Body mass index is 24.21 kg/m.           DVT prophylaxis:  Place TED hose Start: 10/17/21 1156 SCDs Start: 10/17/21 0018  Code Status: Full code Family Communication: Updated patient's daughter at bedside Level of care: Telemetry Status is: Inpatient Remains inpatient appropriate because: Due to GI bleed   Final disposition: Likely home once medically cleared Consultants:  Gastroenterology  Sch Meds:  Scheduled Meds:  atorvastatin  80 mg Oral Daily   insulin aspart  0-5 Units Subcutaneous QHS   insulin aspart  0-6 Units Subcutaneous TID WC   pantoprazole  40 mg Oral Daily   sodium chloride flush  3 mL Intravenous Q12H   venlafaxine XR  225 mg Oral Q breakfast   Continuous Infusions:   PRN Meds:.acetaminophen **OR** acetaminophen, ondansetron **OR** ondansetron (ZOFRAN) IV  Antimicrobials: Anti-infectives (From admission, onward)    None        I have personally reviewed the following labs and images: CBC: Recent Labs  Lab 10/16/21 2138 10/17/21 0257 10/17/21 1024 10/17/21 1459 10/18/21 0438  WBC 6.7 8.1  --   --  6.7  NEUTROABS 3.9  --   --   --   --   HGB 13.2 13.1 12.2 12.6 12.4  HCT 40.5 39.3 37.6  39.2 37.3  MCV 91.6 91.6  --   --  92.3  PLT 204 201  --   --  186   BMP &GFR Recent Labs  Lab 10/16/21 2138 10/17/21 0257 10/18/21 0438  NA 140 142 141  K 4.6 4.0 4.4  CL 107 108 110  CO2 '26 28 26  '$ GLUCOSE 337* 78 142*  BUN 30* 27* 16  CREATININE 0.95 0.75 0.72  CALCIUM 9.2 8.9 8.6*  MG  --   --  2.0  PHOS  --   --  3.5   Estimated Creatinine Clearance: 50.8 mL/min (by C-G formula based on SCr of 0.72 mg/dL). Liver & Pancreas: Recent Labs  Lab 10/16/21 2138 10/18/21 0438  AST 23  --   ALT 21  --   ALKPHOS 70  --   BILITOT 0.4  --   PROT 6.4*  --   ALBUMIN 3.3* 3.1*   Recent Labs  Lab 10/16/21 2138  LIPASE 35   No results for input(s): "AMMONIA" in the last 168 hours. Diabetic: Recent  Labs    10/17/21 0257  HGBA1C 8.3*   Recent Labs  Lab 10/17/21 1611 10/17/21 2124 10/18/21 0401 10/18/21 0758 10/18/21 1154  GLUCAP 185* 235* 145* 187* 203*   Cardiac Enzymes: No results for input(s): "CKTOTAL", "CKMB", "CKMBINDEX", "TROPONINI" in the last 168 hours. No results for input(s): "PROBNP" in the last 8760 hours. Coagulation Profile: Recent Labs  Lab 10/16/21 2138  INR 1.4*   Thyroid Function Tests: No results for input(s): "TSH", "T4TOTAL", "FREET4", "T3FREE", "THYROIDAB" in the last 72 hours. Lipid Profile: No results for input(s): "CHOL", "HDL", "LDLCALC", "TRIG", "CHOLHDL", "LDLDIRECT" in the last 72 hours. Anemia Panel: No results for input(s): "VITAMINB12", "FOLATE", "FERRITIN", "TIBC", "IRON", "RETICCTPCT" in the last 72 hours. Urine analysis:    Component Value Date/Time   COLORURINE YELLOW 10/16/2021 2244   APPEARANCEUR HAZY (A) 10/16/2021 2244   LABSPEC 1.025 10/16/2021 2244   PHURINE 8.0 10/16/2021 2244   GLUCOSEU >=500 (A) 10/16/2021 2244   HGBUR MODERATE (A) 10/16/2021 2244   BILIRUBINUR NEGATIVE 10/16/2021 2244   KETONESUR NEGATIVE 10/16/2021 2244   PROTEINUR NEGATIVE 10/16/2021 2244   UROBILINOGEN 1.0 10/09/2012 1630   NITRITE  NEGATIVE 10/16/2021 2244   LEUKOCYTESUR LARGE (A) 10/16/2021 2244   Sepsis Labs: Invalid input(s): "PROCALCITONIN", "LACTICIDVEN"  Microbiology: No results found for this or any previous visit (from the past 240 hour(s)).  Radiology Studies: No results found.    Mayford Alberg T. [place]  If 7PM-7AM, please contact night-coverage [email or web address] 10/18/2021, 12:24 PM

## 2021-10-18 NOTE — Progress Notes (Signed)
Subjective: Patient was hospitalized with rectal bleeding but has been hemodynamically stable since admission, off of Xarelto and Plavix [given for PAF & history of stroke]. As per my discussion with Dr. Cyndia Skeeters, she is stable to be discharged from a GI standpoint. He had questions about her anticoagulants. I advised him to start her on a heart healthy diet and get an opinion from cardiology about her anticoagulation. I appreciate the input from the cardiology service. I think she will need clearance from the neurology service as well. There is nothing more for [place] to do from a GI standpoint. She had a colonoscopy last year in October that revealed pandiverticulosis. I have received multiple calls today from her daughter about the plans for her discharge. I have asked her to speak to Dr. Cyndia Skeeters tomorrow morning. We will see the patient in follow up in our office in 2 weeks. Please let us know if further assistance is needed.    Juanita Craver, MD

## 2021-10-19 DIAGNOSIS — E118 Type 2 diabetes mellitus with unspecified complications: Secondary | ICD-10-CM | POA: Diagnosis not present

## 2021-10-19 DIAGNOSIS — Z8673 Personal history of transient ischemic attack (TIA), and cerebral infarction without residual deficits: Secondary | ICD-10-CM | POA: Diagnosis not present

## 2021-10-19 DIAGNOSIS — I480 Paroxysmal atrial fibrillation: Secondary | ICD-10-CM | POA: Diagnosis not present

## 2021-10-19 DIAGNOSIS — K922 Gastrointestinal hemorrhage, unspecified: Secondary | ICD-10-CM | POA: Diagnosis not present

## 2021-10-19 LAB — CBC
HCT: 39 % (ref 36.0–46.0)
Hemoglobin: 12.8 g/dL (ref 12.0–15.0)
MCH: 30 pg (ref 26.0–34.0)
MCHC: 32.8 g/dL (ref 30.0–36.0)
MCV: 91.5 fL (ref 80.0–100.0)
Platelets: 194 10*3/uL (ref 150–400)
RBC: 4.26 MIL/uL (ref 3.87–5.11)
RDW: 12.8 % (ref 11.5–15.5)
WBC: 7.8 10*3/uL (ref 4.0–10.5)
nRBC: 0 % (ref 0.0–0.2)

## 2021-10-19 LAB — GLUCOSE, CAPILLARY
Glucose-Capillary: 152 mg/dL — ABNORMAL HIGH (ref 70–99)
Glucose-Capillary: 283 mg/dL — ABNORMAL HIGH (ref 70–99)
Glucose-Capillary: 338 mg/dL — ABNORMAL HIGH (ref 70–99)
Glucose-Capillary: 164 mg/dL — ABNORMAL HIGH (ref 70–99)

## 2021-10-19 MED ORDER — INSULIN ASPART 100 UNIT/ML IJ SOLN
0.0000 [IU] | Freq: Three times a day (TID) | INTRAMUSCULAR | Status: DC
  Administered 2021-10-19: 11 [IU] via SUBCUTANEOUS
  Administered 2021-10-20: 3 [IU] via SUBCUTANEOUS

## 2021-10-19 MED ORDER — INSULIN ASPART 100 UNIT/ML IJ SOLN: 0.0000 [IU] | Freq: Every day | INTRAMUSCULAR | Status: DC

## 2021-10-19 MED ORDER — APIXABAN 2.5 MG PO TABS
2.5000 mg | ORAL_TABLET | Freq: Two times a day (BID) | ORAL | Status: DC
  Administered 2021-10-19 – 2021-10-20 (×2): 2.5 mg via ORAL
  Filled 2021-10-19 (×2): qty 1

## 2021-10-19 NOTE — TOC Transition Note (Signed)
Transition of Care [facility]) - CM/SW Discharge Note   Patient Details  Name: Rachel Santos MRN: [id number] Date of Birth: 01/15/1941  [facility] CM/SW Contact:  Lennart Pall, LCSW Phone Number: 10/19/2021, 1:18 PM   Clinical Narrative:    Met with pt and daughter today to discuss recommendation for HHPT follow up.  Both are agreeable with this plan but request afternoon visits if possible.  No agency preference.  Able to secure HHPT with [facility] who will schedule visits with pt/daughter on preference. Pt has all needed DME at home.  TOC will sign off - please reorder if new needs arise.   Final next level of care: [place] Barriers to Discharge: Continued Medical Work up   Patient Goals and CMS Choice Patient states their goals for this hospitalization and ongoing recovery are:: return home      Discharge Placement                       Discharge Plan and Services                DME Arranged: N/A DME Agency: NA       HH Arranged: PT HH Agency: [place] Date [place]: 10/19/21 Time [place]: 2419 Representative spoke with at [place]: [place] ([place]) Interventions     Readmission Risk Interventions    10/19/2021   12:59 PM  Readmission Risk Prevention Plan  Post Dischage Appt Complete  Medication Screening Complete  Transportation Screening Complete

## 2021-10-19 NOTE — Progress Notes (Signed)
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  On admission, vitals stable.  Hgb 13.2.  Hemoccult positive.  CT abdomen and pelvis without significant finding other than diverticulosis and known right adnexal cystic structure measuring about 5.5 cm which seems to be decreased compared to prior imaging in 2020.  Patient has not had further GI bleed.  H&H remained stable.  GI recommended cardiology and neurology input for decision about his Xarelto and Plavix, but nothing else to add from GI standpoint.  Cardiology suggest challenging with low-dose Eliquis instead of Xarelto before discharge.  Neurology consulted to weigh in given severe left MCA stenosis and being on Plavix   Subjective: Seen and examined earlier this morning.  No major events overnight of this morning.  Has not a bowel movement yet.  No further GI bleed.  She had some "fuzzy" vision when she woke up this morning.  No other focal neuro symptoms.  Denies chest pain or dyspnea.  Patient's daughter at bedside.  Objective: Vitals:   10/18/21 1411 10/18/21 2301 10/19/21 0454 10/19/21 0903  BP: (!) 142/59 (!) 154/45 (!) 149/54 114/70  Pulse: (!) 55 (!) 58 (!) 57 61  Resp: '18 16 15 17  '$ Temp: 98.2 F (36.8 C) 98.2 F (36.8 C) 98.1 F (36.7 C) 97.8 F (36.6 C)  TempSrc: Oral Oral Oral Oral  SpO2: 99% 99% 95% 95%  Weight:      Height:        Examination:  GENERAL: No apparent  distress.  Nontoxic. HEENT: MMM.  Vision and hearing grossly intact.  NECK: Supple.  No apparent JVD.  RESP:  No IWOB.  Fair aeration bilaterally. CVS: Sinus bradycardia to 50s.  Heart sounds normal.  ABD/GI/GU: BS+. Abd soft, NTND.  MSK/EXT:  Moves extremities. No apparent deformity. No edema.  SKIN: no apparent skin lesion or wound NEURO: Awake, alert and oriented appropriately. Speech clear. Cranial nerves II-XII  intact. Motor 5/5 in all muscle groups of UE and LE bilaterally, Normal tone. Light sensation intact in all dermatomes of upper and lower ext bilaterally. Patellar reflex symmetric.  No pronator drift.  Finger to nose intact. PSYCH: Calm. Normal affect.   Procedures:  None  Microbiology summarized: None  Assessment and plan: Principal Problem:   Acute GI bleeding Active Problems:   PAF (paroxysmal atrial fibrillation) (HCC)   History of CVA (cerebrovascular accident)   Type 2 diabetes mellitus with complication, with long-term current use of insulin (HCC)   Chronic kidney disease, stage 3a ([place])   Depression with anxiety   Abnormal finding on imaging   Simple adnexal cyst greater than 5 cm in diameter in premenopausal patient   Orthostatic hypotension   Pyuria   Microscopic hematuria   Rectal bleeding  Painless hematochezia: Likely diverticular bleed but she has slightly elevated BUN to creatinine ratio as well.  Hemoccult positive.  H&H relatively stable.  No further GI bleed here.  Followed by [facility].  Colonoscopy in 12/2020 with diverticulosis but no mention of hemorrhoids.  Recent Labs    10/16/21 2138 10/17/21 0257 10/17/21 1024 10/17/21 1459 10/18/21 0438 10/19/21 0418  HGB 13.2 13.1 12.2 12.6 12.4 12.8  -GI recs: okay to discharge pending cardiology and neurology input for decision about Xarelto and Plavix -Cardiology suggest this challenging with low-dose Eliquis prior to discharge -Neurology input pending -Monitor H&H   Paroxysmal  A-fib/junctional bradycardia: Seems to have junctional bradycardia on EKG.  Not on rate or rhythm control meds.  On Xarelto for anticoagulation prior to admission. -Cardiology suggests rechallenge with low-dose Eliquis prior to discharge pending neuro input   History of CVA/left MCA stenosis: On Plavix at home. -Continue holding Plavix in setting of GI bleed pending neuro input -Continue home Lipitor  Chronic orthostatic hypotension: Orthostatic vitals positive.  Not on antihypertensive meds other than Farxiga. -Encourage wearing TED hose and staying upright -Advised stepwise approach when she gets up and move -Ambulating in the hallway -PT/OT-HH PT recommended.   Uncontrolled IDDM-2 with hyperglycemia, CKD 3A and hyperlipidemia: A1c 8.3%. Recent Labs  Lab 10/18/21 1154 10/18/21 1605 10/18/21 2159 10/19/21 0728 10/19/21 1148  GLUCAP 203* 197* 161* 152* 283*  -Hold home Farxiga -Increase SSI to moderate -Continue statin. -Change diet to carb modified  Pyuria/microscopic hematuria: No UTI symptoms.  Hematuria likely from anticoagulation and Plavix. -Monitor  CKD-3A: Stable. Recent Labs    01/13/21 1418 10/16/21 2138 10/17/21 0257 10/18/21 0438  BUN  --  30* 27* 16  CREATININE 0.90 0.95 0.75 0.72  -Continue monitoring  Right adnexal cyst: 5.5 cm right adnexal cystic structure noted on CT but it decreased in size compared to prior CT. -Nonemergent follow-up US is recommended. Patient/family are aware of findings.   Anxiety and depression: Stable. -Continue Effexor XR.  Body mass index is 24.21 kg/m.           DVT prophylaxis:  Place and maintain sequential compression device Start: 10/19/21 0921 Place TED hose Start: 10/17/21 1156 SCDs Start: 10/17/21 0018  Code Status: Full code Family Communication: Updated patient's daughter at bedside Level of care: Telemetry Status is: Inpatient Remains inpatient appropriate because: Due to GI bleed   Final  disposition: Likely home once medically cleared Consultants:  Gastroenterology Cardiology Neurology  Sch Meds:  Scheduled Meds:  atorvastatin  80 mg Oral Daily   insulin aspart  0-15 Units Subcutaneous TID WC   insulin aspart  0-5 Units Subcutaneous QHS   pantoprazole  40 mg Oral Daily   sodium chloride flush  3 mL Intravenous Q12H   venlafaxine XR  225 mg Oral Q breakfast   Continuous Infusions:   PRN Meds:.acetaminophen **OR** acetaminophen, ondansetron **OR** ondansetron (ZOFRAN) IV  Antimicrobials: Anti-infectives (From admission, onward)    None        I have personally reviewed the following labs and images: CBC: Recent Labs  Lab 10/16/21 2138 10/17/21 0257 10/17/21 1024 10/17/21 1459 10/18/21 0438 10/19/21 0418  WBC 6.7 8.1  --   --  6.7 7.8  NEUTROABS 3.9  --   --   --   --   --   HGB 13.2 13.1 12.2 12.6 12.4 12.8  HCT 40.5 39.3 37.6 39.2 37.3 39.0  MCV 91.6 91.6  --   --  92.3 91.5  PLT 204 201  --   --  186 194   BMP &GFR Recent Labs  Lab 10/16/21 2138 10/17/21 0257 10/18/21 0438  NA 140 142 141  K 4.6 4.0  4.4  CL 107 108 110  CO2 '26 28 26  '$ GLUCOSE 337* 78 142*  BUN 30* 27* 16  CREATININE 0.95 0.75 0.72  CALCIUM 9.2 8.9 8.6*  MG  --   --  2.0  PHOS  --   --  3.5   Estimated Creatinine Clearance: 50.8 mL/min (by C-G formula based on SCr of 0.72 mg/dL). Liver & Pancreas: Recent Labs  Lab 10/16/21 2138 10/18/21 0438  AST 23  --   ALT 21  --   ALKPHOS 70  --   BILITOT 0.4  --   PROT 6.4*  --   ALBUMIN 3.3* 3.1*   Recent Labs  Lab 10/16/21 2138  LIPASE 35   No results for input(s): "AMMONIA" in the last 168 hours. Diabetic: Recent Labs    10/17/21 0257  HGBA1C 8.3*   Recent Labs  Lab 10/18/21 1154 10/18/21 1605 10/18/21 2159 10/19/21 0728 10/19/21 1148  GLUCAP 203* 197* 161* 152* 283*   Cardiac Enzymes: No results for input(s): "CKTOTAL", "CKMB", "CKMBINDEX", "TROPONINI" in the last 168 hours. No results for  input(s): "PROBNP" in the last 8760 hours. Coagulation Profile: Recent Labs  Lab 10/16/21 2138  INR 1.4*   Thyroid Function Tests: No results for input(s): "TSH", "T4TOTAL", "FREET4", "T3FREE", "THYROIDAB" in the last 72 hours. Lipid Profile: No results for input(s): "CHOL", "HDL", "LDLCALC", "TRIG", "CHOLHDL", "LDLDIRECT" in the last 72 hours. Anemia Panel: No results for input(s): "VITAMINB12", "FOLATE", "FERRITIN", "TIBC", "IRON", "RETICCTPCT" in the last 72 hours. Urine analysis:    Component Value Date/Time   COLORURINE YELLOW 10/16/2021 2244   APPEARANCEUR HAZY (A) 10/16/2021 2244   LABSPEC 1.025 10/16/2021 2244   PHURINE 8.0 10/16/2021 2244   GLUCOSEU >=500 (A) 10/16/2021 2244   HGBUR MODERATE (A) 10/16/2021 2244   BILIRUBINUR NEGATIVE 10/16/2021 2244   KETONESUR NEGATIVE 10/16/2021 2244   PROTEINUR NEGATIVE 10/16/2021 2244   UROBILINOGEN 1.0 10/09/2012 1630   NITRITE NEGATIVE 10/16/2021 2244   LEUKOCYTESUR LARGE (A) 10/16/2021 2244   Sepsis Labs: Invalid input(s): "PROCALCITONIN", "LACTICIDVEN"  Microbiology: No results found for this or any previous visit (from the past 240 hour(s)).  Radiology Studies: No results found.    Bernard Slayden T. [place]  If 7PM-7AM, please contact night-coverage [email or web address] 10/19/2021, 12:23 PM

## 2021-10-19 NOTE — Progress Notes (Signed)
   10/19/21 1300  Mobility  Activity Ambulated with assistance in hallway  Level of Assistance Contact guard assist, steadying assist  Assistive Device Front wheel walker  Distance Ambulated (ft) 150 ft  Activity Response Tolerated well  Transport method Ambulatory  $Mobility charge 1 Mobility   Pt walked with daughter an hour prior to my session. Was dizzy during that walk. Checked BP prior to ambulation due to dizziness and after ambulation. Left Pt with all necessities.   Mobility Specialist - Progress Note   Pre-mobility: 143/58, 96% (RA) Post-mobility:  155/69,100% (RA)  Roderick Pee  Mobility Specialist

## 2021-10-19 NOTE — Progress Notes (Signed)
Progress Note  Patient Name: Rachel Santos Date of Encounter: 10/19/2021  [facility] Cardiologist: Lauree Chandler, MD   Subjective   Pt feels well, found walking halls with daughter, no further bleeding. Not on telemetry  Inpatient Medications    Scheduled Meds:  atorvastatin  80 mg Oral Daily   insulin aspart  0-5 Units Subcutaneous QHS   insulin aspart  0-9 Units Subcutaneous TID WC   pantoprazole  40 mg Oral Daily   sodium chloride flush  3 mL Intravenous Q12H   venlafaxine XR  225 mg Oral Q breakfast   Continuous Infusions:  PRN Meds: acetaminophen **OR** acetaminophen, ondansetron **OR** ondansetron (ZOFRAN) IV   Vital Signs    Vitals:   10/18/21 1411 10/18/21 2301 10/19/21 0454 10/19/21 0903  BP: (!) 142/59 (!) 154/45 (!) 149/54 114/70  Pulse: (!) 55 (!) 58 (!) 57 61  Resp: '18 16 15 17  '$ Temp: 98.2 F (36.8 C) 98.2 F (36.8 C) 98.1 F (36.7 C) 97.8 F (36.6 C)  TempSrc: Oral Oral Oral Oral  SpO2: 99% 99% 95% 95%  Weight:      Height:        Intake/Output Summary (Last 24 hours) at 10/19/2021 1059 Last data filed at 10/19/2021 0939 Gross per 24 hour  Intake 940 ml  Output 1950 ml  Net -1010 ml      10/16/2021    8:45 PM 04/28/2021    8:23 AM 04/19/2021    9:09 AM  Last 3 Weights  Weight (lbs) 150 lb 147 lb 9.6 oz 149 lb  Weight (kg) 68.04 kg 66.951 kg 67.586 kg      Telemetry    N/A - Personally Reviewed  ECG    No new tracings - Personally Reviewed  Physical Exam   GEN: No acute distress.   Neck: No JVD Cardiac: RRR, + murmur Respiratory: Clear to auscultation bilaterally. GI: Soft, nontender, non-distended  MS: No edema; No deformity. Neuro:  Nonfocal  Psych: Normal affect   Labs    High Sensitivity Troponin:  No results for input(s): "TROPONINIHS" in the last 720 hours.   Chemistry Recent Labs  Lab 10/16/21 2138 10/17/21 0257 10/18/21 0438  NA 140 142 141  K 4.6 4.0 4.4  CL 107 108 110  CO2 '26 28 26   '$ GLUCOSE 337* 78 142*  BUN 30* 27* 16  CREATININE 0.95 0.75 0.72  CALCIUM 9.2 8.9 8.6*  MG  --   --  2.0  PROT 6.4*  --   --   ALBUMIN 3.3*  --  3.1*  AST 23  --   --   ALT 21  --   --   ALKPHOS 70  --   --   BILITOT 0.4  --   --   GFRNONAA 60* >60 >60  ANIONGAP '7 6 5    '$ Lipids No results for input(s): "CHOL", "TRIG", "HDL", "LABVLDL", "LDLCALC", "CHOLHDL" in the last 168 hours.  Hematology Recent Labs  Lab 10/17/21 0257 10/17/21 1024 10/17/21 1459 10/18/21 0438 10/19/21 0418  WBC 8.1  --   --  6.7 7.8  RBC 4.29  --   --  4.04 4.26  HGB 13.1   < > 12.6 12.4 12.8  HCT 39.3   < > 39.2 37.3 39.0  MCV 91.6  --   --  92.3 91.5  MCH 30.5  --   --  30.7 30.0  MCHC 33.3  --   --  33.2 32.8  RDW 13.1  --   --  12.9 12.8  PLT 201  --   --  186 194   < > = values in this interval not displayed.   Thyroid No results for input(s): "TSH", "FREET4" in the last 168 hours.  BNPNo results for input(s): "BNP", "PROBNP" in the last 168 hours.  DDimer No results for input(s): "DDIMER" in the last 168 hours.   Radiology    No results found.  Cardiac Studies   Echo 01/2020: 1. Left ventricular ejection fraction, by estimation, is 60 to 65%. The  left ventricle has normal function. The left ventricle has no regional  wall motion abnormalities. There is mild left ventricular hypertrophy.  Left ventricular diastolic parameters  are consistent with Grade II diastolic dysfunction (pseudonormalization).   2. Right ventricular systolic function is normal. The right ventricular  size is normal. Tricuspid regurgitation signal is inadequate for assessing  PA pressure.   3. Left atrial size was moderately dilated.   4. Status post tricuspid valve repair. Trivial tricuspid regurgitation.  No significant stenosis with mean gradient 2 mmHg.   5. Status post mitral valve repair. Trivial mitral regurgitation. Mean  gradient 4 mmHg, mild mitral stenosis.   6. Bioprosthetic aortic valve s/p TAVR  (23 mm Edwards Sapien). Trivial  peri-valvular leakage. Mean gradient 16 mmHg, mildly elevated.   7. The inferior vena cava is normal in size with greater than 50%  respiratory variability, suggesting right atrial pressure of 3 mmHg.   Patient Profile     81 y.o. 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 Would recommend restarting either OAC or antiplatelet, but likely not both. Would get input from neurology. From a cardiology standpoint, we prefer to transition to 2.5 mg eliquis BID for stroke prophylaxis.    Hx of CVA Defer antiplatelet decisions to neurlogy - they are obtaining head imaging to make a decision regarding plavix   Acute GI bleed H/H stable, no further bleeding, GI has signed off with no further procedures planned.        For questions or updates, please contact [place] Please consult [email or web address] for contact info under        Signed, Ledora Bottcher, PA  10/19/2021, 10:59 AM

## 2021-10-19 NOTE — Inpatient Diabetes Management (Signed)
Inpatient Diabetes Program Recommendations  AACE/ADA: New Consensus Statement on Inpatient Glycemic Control (2015)  Target Ranges:  Prepandial:   less than 140 mg/dL      Peak postprandial:   less than 180 mg/dL (1-2 hours)      Critically ill patients:  140 - 180 mg/dL   Lab Results  Component Value Date   GLUCAP 283 (H) 10/19/2021   HGBA1C 8.3 (H) 10/17/2021    Review of Glycemic Control  Diabetes history: DM2 Outpatient Diabetes medications: Levemir 54 units QD, Novolin R 15 units with supper meal, Farxiga 10 QD Current orders for Inpatient glycemic control: Novolog 0-9 units TID with meals and 0-5 HS  HgbA1C - 8.3%  Inpatient Diabetes Program Recommendations:    Add Novolog 5 units TID with meals if eating > 50% Add Levemir 10 units QD if FBS > 180 mg/dL.  Continue to follow.  Thank you. Lorenda Peck, RD, LDN, CDE Inpatient Diabetes Coordinator [phone]

## 2021-10-19 NOTE — Consult Note (Addendum)
Neurology Consultation  Reason for Consult: recommendation for Plavix need Referring Physician: Dr. Cyndia Skeeters  CC: Acute GI Bleed  History is obtained from:medical record  HPI: Rachel Santos is a 81 y.o. 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  Daughter is at bedside, She states she had acute bleeding on Saturday and none since. Patient is on Xarelto for A fib and Plavix for intracranial stenosis followed by Dr. Estanislado Pandy. Neurology has been consulted to weigh in on the need for continuation of Plavix   ROS: Full ROS was performed and is negative except as noted in the HPI.    Past Medical History:  Diagnosis Date   Anxiety    Arthritis    knees   Atrial fibrillation (HCC)    CAD (coronary artery disease)    a. pre TAVR cath 2020 mild nonobstructive CAD in the LAD and CX with moderate nononstructive disease in the mid-distal RCA and PDA.   Cancer ([place])    left breast   Cataracts, bilateral    CKD (chronic kidney disease), stage III (HCC)    CVA (cerebral vascular accident) ([place])    Depression    Diabetes mellitus    DVT (deep venous thrombosis) (HCC)    hx of   Dyspnea    with exertion   Full dentures    GERD (gastroesophageal reflux disease)    Groin hematoma    Hearing aid worn    B/L   History of TIA (transient ischemic attack)    mini stroke with vision problems   Hypercholesterolemia    Hypertension    Hypertensive nephropathy    MCI (mild cognitive impairment)    Memory loss    Middle cerebral artery stenosis, left    Mitral regurgitation    Paroxysmal atrial fibrillation (HCC)    Pneumonia    PONV (postoperative nausea and vomiting)     Premature atrial contraction    S/P Maze operation for atrial fibrillation 10/11/2012   Complete bilateral atrial lesion set using bipolar radiofrequency and cryothermy ablation with clipping of LA appendage   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   45m Sorin Memo 3D ring annuloplasty with 26 mm Edwards mc3 tricuspid ring annuloplasty    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 She has never used smokeless tobacco. She reports that she does not drink alcohol and does not use drugs.  Medications  Current Facility-Administered Medications:    acetaminophen (TYLENOL) tablet 650 mg, 650 mg, Oral, Q6H PRN **OR** acetaminophen (TYLENOL) suppository 650 mg, 650 mg, Rectal, Q6H PRN, PPosey Pronto Vishal R, MD   atorvastatin (LIPITOR) tablet 80 mg, 80 mg, Oral, Daily, PPosey Pronto Vishal  R, MD, 80 mg at 10/19/21 0733   insulin aspart (novoLOG) injection 0-5 Units, 0-5 Units, Subcutaneous, QHS, Gonfa, Taye T, MD   insulin aspart (novoLOG) injection 0-9 Units, 0-9 Units, Subcutaneous, TID WC, Cyndia Skeeters, Taye T, MD, 2 Units at 10/19/21 0733   ondansetron (ZOFRAN) tablet 4 mg, 4 mg, Oral, Q6H PRN **OR** ondansetron (ZOFRAN) injection 4 mg, 4 mg, Intravenous, Q6H PRN, Posey Pronto, Vishal R, MD   pantoprazole (PROTONIX) EC tablet 40 mg, 40 mg, Oral, Daily, Zada Finders R, MD, 40 mg at 10/19/21 0733   sodium chloride flush (NS) 0.9 % injection 3 mL, 3 mL, Intravenous, Q12H, Zada Finders R, MD, 3 mL at 10/18/21 0408   venlafaxine XR (EFFEXOR-XR) 24 hr capsule 225 mg, 225 mg, Oral, Q breakfast, Zada Finders R, MD, 225 mg at  10/19/21 0734   Exam: Current vital signs: BP (!) 149/54 (BP Location: Left Arm)   Pulse (!) 57   Temp 98.1 F (36.7 C) (Oral)   Resp 15   Ht '5\' 6"'$  (1.676 m)   Wt 68 kg   SpO2 95%   BMI 24.21 kg/m  Vital signs in last 24 hours: Temp:  [98.1 F (36.7 C)-98.2 F (36.8 C)] 98.1 F (36.7 C) (07/25 0454) Pulse Rate:  [55-58] 57 (07/25 0454) Resp:  [15-18] 15 (07/25 0454) BP: (142-154)/(45-59) 149/54 (07/25 0454) SpO2:  [95 %-99 %] 95 % (07/25 0454)  GENERAL: patient asleep, awakens easily to voice in NAD HEENT: - Normocephalic and atraumatic, dry mm LUNGS - Clear to auscultation bilaterally with no wheezes CV - S1S2 RRR, no m/r/g, equal pulses bilaterally. ABDOMEN - Soft, nontender, nondistended with normoactive BS Ext: warm, well perfused, intact peripheral pulses, no edema  NEURO:  Mental Status: AA&Ox4 Language: speech is clear.  Naming, repetition, fluency, and comprehension intact. Cranial Nerves: PERRL, EOMI, visual fields full, no facial asymmetry, facial sensation intact, hearing intact, tongue/uvula/soft palate midline, normal sternocleidomastoid and trapezius muscle strength. No evidence of tongue atrophy or fibrillations Motor: 5/5 in all 4 extremities Tone: is normal and bulk is normal Sensation- Intact to light touch bilaterally Coordination: FTN intact bilaterally, no ataxia in BLE. Gait- deferred     Labs I have reviewed labs in epic and the results pertinent to this consultation are:  CBC    Component Value Date/Time   WBC 7.8 10/19/2021 0418   RBC 4.26 10/19/2021 0418   HGB 12.8 10/19/2021 0418   HGB 12.3 05/27/2019 1421   HCT 39.0 10/19/2021 0418   HCT 37.0 05/27/2019 1421   PLT 194 10/19/2021 0418   PLT 250 05/27/2019 1421   MCV 91.5 10/19/2021 0418   MCV 94 05/27/2019 1421   MCH 30.0 10/19/2021 0418   MCHC 32.8 10/19/2021 0418   RDW 12.8 10/19/2021 0418   RDW 12.0 05/27/2019 1421   LYMPHSABS 1.8 10/16/2021 2138   LYMPHSABS 2.4  05/27/2019 1421   MONOABS 0.7 10/16/2021 2138   EOSABS 0.1 10/16/2021 2138   EOSABS 0.1 05/27/2019 1421   BASOSABS 0.0 10/16/2021 2138   BASOSABS 0.0 05/27/2019 1421    CMP     Component Value Date/Time   NA 141 10/18/2021 0438   NA 137 12/17/2018 1006   K 4.4 10/18/2021 0438   CL 110 10/18/2021 0438   CO2 26 10/18/2021 0438   GLUCOSE 142 (H) 10/18/2021 0438   BUN 16 10/18/2021 0438   BUN 23 12/17/2018 1006   CREATININE 0.72 10/18/2021 0438   CALCIUM 8.6 (L) 10/18/2021 1937  PROT 6.4 (L) 10/16/2021 2138   ALBUMIN 3.1 (L) 10/18/2021 0438   AST 23 10/16/2021 2138   ALT 21 10/16/2021 2138   ALKPHOS 70 10/16/2021 2138   BILITOT 0.4 10/16/2021 2138   GFRNONAA >60 10/18/2021 0438   GFRAA 55 (L) 06/27/2019 0642    Lipid Panel     Component Value Date/Time   CHOL 112 01/23/2019 1530   TRIG 149 01/23/2019 1530   HDL 38 (L) 01/23/2019 1530   CHOLHDL 2.9 01/23/2019 1530   VLDL 30 01/23/2019 1530   LDLCALC 44 01/23/2019 1530   I have seen the patient and reviewed the above note.  She has not had any symptoms referable to the left MCA since her previous stroke several years ago.   Assessment:   DJUNA FRECHETTE is a 81 y.o. 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   GI has cleared her for resumption of anticoagulation, but feel that addition of antiplatelet therapy would be more dangerous.  Given a choice between anticoagulation and antiplatelet therapy I would favor anticoagulation.  There is not great data on addition of antiplatelet therapy to anticoagulation for intracranial stenosis, and reviewing her MRI from her previous stroke it looks more like cardiac  emboli than artery to artery emboli and therefore I would favor addressing this.  With no active symptoms, I do not think a CTA would change her management at the current time.  Recommendations: 1) agree with changing Xarelto to Eliquis 2) can discontinue antiplatelet therapy 3) please call if neurology can be of any further assistance.  Roland Rack, MD [facility] [phone]  If 7pm- 7am, please page neurology on call as listed in [employer].

## 2021-10-20 DIAGNOSIS — R001 Bradycardia, unspecified: Secondary | ICD-10-CM

## 2021-10-20 DIAGNOSIS — R8281 Pyuria: Secondary | ICD-10-CM

## 2021-10-20 DIAGNOSIS — R3129 Other microscopic hematuria: Secondary | ICD-10-CM

## 2021-10-20 LAB — CBC
HCT: 39 % (ref 36.0–46.0)
Hemoglobin: 12.9 g/dL (ref 12.0–15.0)
MCH: 30.4 pg (ref 26.0–34.0)
MCHC: 33.1 g/dL (ref 30.0–36.0)
MCV: 92 fL (ref 80.0–100.0)
Platelets: 194 10*3/uL (ref 150–400)
RBC: 4.24 MIL/uL (ref 3.87–5.11)
RDW: 13 % (ref 11.5–15.5)
WBC: 8.8 10*3/uL (ref 4.0–10.5)
nRBC: 0 % (ref 0.0–0.2)

## 2021-10-20 LAB — GLUCOSE, CAPILLARY: Glucose-Capillary: 197 mg/dL — ABNORMAL HIGH (ref 70–99)

## 2021-10-20 MED ORDER — POLYETHYLENE GLYCOL 3350 17 GM/SCOOP PO POWD: 17.0000 g | Freq: Two times a day (BID) | ORAL | 2 refills | Status: DC | PRN

## 2021-10-20 MED ORDER — INSULIN DETEMIR 100 UNIT/ML ~~LOC~~ SOLN: 7.0000 [IU] | Freq: Two times a day (BID) | SUBCUTANEOUS | 11 refills | Status: DC

## 2021-10-20 MED ORDER — APIXABAN 2.5 MG PO TABS: 2.5000 mg | ORAL_TABLET | Freq: Two times a day (BID) | ORAL | 2 refills | Status: DC

## 2021-10-20 NOTE — Progress Notes (Signed)
Discharge instructions discussed with patient and family, verbalized agreement and understanding 

## 2021-10-20 NOTE — Discharge Summary (Signed)
Physician Discharge Summary  Rachel Santos Kandler KYH:[id number] DOB: [birth year] DOA: 10/16/2021  PCP: Lajean Manes, MD  Admit date: 10/16/2021 Discharge date: 10/20/2021 Admitted From: Home Disposition: Home Recommendations for Outpatient Follow-up:  Follow up with PCP in 1 week Outpatient follow-up with cardiology as previously planned Check BMP and CBC in 1 week Assess blood glucose and adjust insulin as appropriate. Please follow up on the following pending results: None  Home Health: HH PT Equipment/Devices: Not required  Discharge Condition: Stable CODE STATUS: Full code  Follow-up Information     [facility] Follow up.   Why: to provide home health physical therapy visits Contact information: [place] [address] [phone]         Lajean Manes, MD. Schedule an appointment as soon as possible for a visit in 1 week(s).   Specialty: Internal Medicine Contact information: 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   On admission, vitals stable.  Hgb 13.2.  Hemoccult positive.  CT abdomen and pelvis without significant finding other than diverticulosis and known right adnexal cystic structure measuring about 5.5 cm which seems to be decreased compared to prior imaging in 2020.   Patient has not had further GI bleed.  H&H remained stable.  GI recommended cardiology and neurology input for decision about his Xarelto and Plavix, but nothing else to add from GI standpoint.  Cardiology and neurology recommended low-dose Eliquis and  discontinuing Plavix.  As such, patient was started on low-dose Eliquis and has not had further GI bleed.  Hemoglobin remained stable.  See individual problem list below for more.   Problems addressed during this hospitalization Principal Problem:   Acute GI bleeding Active Problems:   PAF (paroxysmal atrial fibrillation) (HCC)   History of CVA (cerebrovascular accident)   Type 2 diabetes mellitus with complication, with long-term current use of insulin (HCC)   Chronic kidney disease, stage 3a ([place])   Depression with anxiety   Abnormal finding on imaging   Simple adnexal cyst greater than 5 cm in diameter in premenopausal patient   Orthostatic hypotension   Pyuria   Microscopic hematuria   Rectal bleeding   Painless hematochezia: Likely diverticular bleed but she has slightly elevated BUN to creatinine ratio as well. Hemoccult positive.  H&H relatively stable.  No further GI bleed here.  She had normal bowel movements prior to discharge.  Followed by [facility].  Colonoscopy in 12/2020 with diverticulosis but no mention of hemorrhoids.  Recent Labs    10/16/21 2138 10/17/21 0257 10/17/21 1024 10/17/21 1459 10/18/21 0438 10/19/21 0418 10/20/21 0433  HGB 13.2 13.1 12.2 12.6 12.4 12.8 12.9  -GI cleared patient for discharge after cardiology and neurology input -Cardiology and neurology agreed on low-dose Eliquis and discontinuing Plavix -H&H remained stable after resuming anticoagulation with low-dose Eliquis -Repeat CBC in 1 week -MiraLAX as needed to avoid constipation   Paroxysmal A-fib/junctional bradycardia: Seems to have junctional bradycardia on EKG.  Not on rate or rhythm control meds.  On Xarelto at home. -Discharged on low-dose Eliquis  per recommendation by cardiology   History of CVA/left MCA stenosis: On Plavix at home. -Neurology in agreement with discontinuing Plavix while on Eliquis. -Continue home Lipitor   Chronic orthostatic hypotension: Orthostatic vitals  positive.  Not on antihypertensive meds other than Farxiga. -Encourage wearing TED hose and staying upright -Advised stepwise approach when she gets up and move -[facility] PT ordered.   Uncontrolled IDDM-2 with hyperglycemia, CKD 3A and hyperlipidemia: A1c 8.3%.  Patient is on Levemir 54 units in the morning and NovoLog 12 units at night.  She only required total of 17 units in house. Recent Labs  Lab 10/19/21 0728 10/19/21 1148 10/19/21 1708 10/19/21 2115 10/20/21 0749  GLUCAP 152* 283* 338* 164* 197*  -Resumed Farxiga on discharge. -Decrease Levemir to 7 units twice daily -Reassess blood glucose and adjust insulin as appropriate -Continue statin.   Pyuria/microscopic hematuria: No UTI symptoms.  Hematuria likely from anticoagulation and Plavix.  H&H stable.   CKD-3A: Stable.  Right adnexal cyst: 5.5 cm right adnexal cystic structure noted on CT but it decreased in size compared to prior CT. -Nonemergent follow-up US is recommended. Patient/family are aware of findings.   Anxiety and depression: Stable. -Continue Effexor XR.             Vital signs Vitals:   10/19/21 0454 10/19/21 0903 10/19/21 2110 10/20/21 0513  BP: (!) 149/54 114/70 (!) 154/49 (!) 145/53  Pulse: (!) 57 61 61 (!) 59  Temp: 98.1 F (36.7 C) 97.8 F (36.6 C) 98.6 F (37 C) 98.4 F (36.9 C)  Resp: '15 17 16 15  '$ Height:      Weight:      SpO2: 95% 95% 97% 96%  TempSrc: Oral Oral Oral Oral  BMI (Calculated):         Discharge exam  GENERAL: No apparent distress.  Nontoxic. HEENT: MMM.  Vision and hearing grossly intact.  NECK: Supple.  No apparent JVD.  RESP:  No IWOB.  Fair aeration bilaterally. CVS:  RRR. Heart sounds normal.  ABD/GI/GU: BS+. Abd soft, NTND.  MSK/EXT:  Moves extremities. No apparent deformity. No edema.  SKIN: no apparent skin lesion or wound NEURO: Awake and alert. Oriented appropriately.  No apparent focal neuro deficit. PSYCH: Calm. Normal affect.  Discharge  Instructions Discharge Instructions     Diet - low sodium heart healthy   Complete by: As directed    Diet Carb Modified   Complete by: As directed    Discharge instructions   Complete by: As directed    It has been a pleasure taking care of you!  You were hospitalized due to rectal bleed that seems to have subsided.  Your hemoglobin remained stable.  We have stopped his Xarelto and Plavix and started you on low-dose Eliquis (apixaban).  Please review your new medication list and the directions on your medications before you take them.  Follow-up with your primary care doctor in 1 to 2 weeks or sooner if needed.  Please return to the hospital if you notice major bleeding.    Take care,   Increase activity slowly   Complete by: As directed       Allergies as of 10/20/2021       Reactions   Buspirone Hcl    lethargic/tired   Metformin And Related Diarrhea   Ozempic (0.25 Or 0.5 Mg-dose) [semaglutide(0.25 Or 0.'5mg'$ -dos)]    Other reaction(s): nausea   Ultram [tramadol] Nausea And Vomiting   Nickel Rash   Pt unable to wear  jewelry made of nickel.   Pneumococcal Vaccines Swelling, Rash   At injection set        Medication List     STOP taking these medications    clopidogrel 75 MG tablet Commonly known as: PLAVIX   insulin regular 100 units/mL injection Commonly known as: NOVOLIN R   Rivaroxaban 15 MG Tabs tablet Commonly known as: XARELTO       TAKE these medications    acetaminophen 500 MG tablet Commonly known as: TYLENOL Take 1,000 mg by mouth every 6 (six) hours as needed for moderate pain or headache.   apixaban 2.5 MG Tabs tablet Commonly known as: ELIQUIS Take 1 tablet (2.5 mg total) by mouth 2 (two) times daily.   atorvastatin 80 MG tablet Commonly known as: LIPITOR Take 1 tablet by mouth once daily   CALCIUM 600 + D PO Take 1 tablet by mouth 2 (two) times daily.   dapagliflozin propanediol 10 MG Tabs tablet Commonly known as: FARXIGA Take 10  mg by mouth daily.   docusate sodium 100 MG capsule Commonly known as: COLACE Take 100-200 mg by mouth daily as needed for mild constipation.   insulin detemir 100 UNIT/ML injection Commonly known as: LEVEMIR Inject 0.07 mLs (7 Units total) into the skin 2 (two) times daily. After breakfast and dinner What changed:  how much to take when to take this additional instructions   multivitamin with minerals tablet Take 1 tablet by mouth daily.   nitroGLYCERIN 0.4 MG SL tablet Commonly known as: NITROSTAT Place 1 tablet (0.4 mg total) under the tongue every 5 (five) minutes as needed for chest pain.   omeprazole 40 MG capsule Commonly known as: PRILOSEC Take 40 mg by mouth daily before breakfast.   polyethylene glycol powder 17 GM/SCOOP powder Commonly known as: MiraLax Take 17 g by mouth 2 (two) times daily as needed for moderate constipation or mild constipation.   venlafaxine XR 75 MG 24 hr capsule Commonly known as: EFFEXOR-XR Take 75 mg by mouth daily with breakfast. Take with 150 mg capsule to equal 225 mg daily   venlafaxine XR 150 MG 24 hr capsule Commonly known as: EFFEXOR-XR Take 150 mg by mouth daily with breakfast. Take with 75 mg capsule to equal 225 mg daily   vitamin B-12 500 MCG tablet Commonly known as: CYANOCOBALAMIN Take 500 mcg by mouth every Monday.        Consultations: Gastroenterology Cardiology Neurology  Procedures/Studies:   CT ABDOMEN PELVIS W CONTRAST  Result Date: 10/16/2021 CLINICAL DATA:  Lower GI bleed, rectal bleeding. EXAM: CT ABDOMEN AND PELVIS WITH CONTRAST TECHNIQUE: Multidetector CT imaging of the abdomen and pelvis was performed using the standard protocol following bolus administration of intravenous contrast. RADIATION DOSE REDUCTION: This exam was performed according to the departmental dose-optimization program which includes automated exposure control, adjustment of the mA and/or kV according to patient size and/or use of  iterative reconstruction technique. CONTRAST:  146m OMNIPAQUE IOHEXOL 300 MG/ML  SOLN COMPARISON:  01/02/2019. FINDINGS: Lower chest: No acute abnormality. Hepatobiliary: No focal liver abnormality is seen. Fatty infiltration of the liver is noted. No biliary ductal dilatation. Stones are present within the gallbladder. Pancreas: Pancreatic atrophy. No pancreatic ductal dilatation or surrounding inflammatory changes. Spleen: Normal in size without focal abnormality. Adrenals/Urinary Tract: The adrenal glands are within normal limits. A cyst is present in the lower pole of the right kidney. Subcentimeter hypodensities are present in the kidneys bilaterally which are too small to further characterize. Rule  out renal calculus or hydronephrosis. The bladder is unremarkable. Stomach/Bowel: There is a small hiatal hernia. The stomach is within normal limits. No bowel obstruction, free air, or pneumatosis. Scattered diverticula are present along the colon without evidence of diverticulitis. No focal bowel wall thickening or surrounding inflammatory changes. The appendix is not visualized on exam. Vascular/Lymphatic: Aortic atherosclerosis. No enlarged abdominal or pelvic lymph nodes. Reproductive: The uterus is within normal limits. There is a cyst in the right adnexa measuring 5.5 cm, increased in size from 2020. No adnexal mass on the left. Other: No abdominopelvic ascites. A fat containing umbilical hernia is noted. Increased density is noted in the anterior abdominal wall bilaterally which is unchanged from 2020. Musculoskeletal: Degenerative changes are present in the thoracic spine. No acute osseous abnormality. IMPRESSION: 1. No acute intra-abdominal process. 2. Diverticulosis without diverticulitis. 3. Cystic structure in the right adnexa measuring 5.5 cm, decreased from 2020. Ultrasound is recommended for further evaluation on nonemergent follow-up. 4. Aortic atherosclerosis. 5. Remaining incidental findings as  described above. Electronically Signed   By: Brett Fairy M.D.   On: 10/16/2021 23:25       The results of significant diagnostics from this hospitalization (including imaging, microbiology, ancillary and laboratory) are listed below for reference.     Microbiology: No results found for this or any previous visit (from the past 240 hour(s)).   Labs:  CBC: Recent Labs  Lab 10/16/21 2138 10/17/21 0257 10/17/21 1024 10/17/21 1459 10/18/21 0438 10/19/21 0418 10/20/21 0433  WBC 6.7 8.1  --   --  6.7 7.8 8.8  NEUTROABS 3.9  --   --   --   --   --   --   HGB 13.2 13.1 12.2 12.6 12.4 12.8 12.9  HCT 40.5 39.3 37.6 39.2 37.3 39.0 39.0  MCV 91.6 91.6  --   --  92.3 91.5 92.0  PLT 204 201  --   --  186 194 194   BMP &GFR Recent Labs  Lab 10/16/21 2138 10/17/21 0257 10/18/21 0438  NA 140 142 141  K 4.6 4.0 4.4  CL 107 108 110  CO2 '26 28 26  '$ GLUCOSE 337* 78 142*  BUN 30* 27* 16  CREATININE 0.95 0.75 0.72  CALCIUM 9.2 8.9 8.6*  MG  --   --  2.0  PHOS  --   --  3.5   Estimated Creatinine Clearance: 50.8 mL/min (by C-G formula based on SCr of 0.72 mg/dL). Liver & Pancreas: Recent Labs  Lab 10/16/21 2138 10/18/21 0438  AST 23  --   ALT 21  --   ALKPHOS 70  --   BILITOT 0.4  --   PROT 6.4*  --   ALBUMIN 3.3* 3.1*   Recent Labs  Lab 10/16/21 2138  LIPASE 35   No results for input(s): "AMMONIA" in the last 168 hours. Diabetic: No results for input(s): "HGBA1C" in the last 72 hours. Recent Labs  Lab 10/19/21 0728 10/19/21 1148 10/19/21 1708 10/19/21 2115 10/20/21 0749  GLUCAP 152* 283* 338* 164* 197*   Cardiac Enzymes: No results for input(s): "CKTOTAL", "CKMB", "CKMBINDEX", "TROPONINI" in the last 168 hours. No results for input(s): "PROBNP" in the last 8760 hours. Coagulation Profile: Recent Labs  Lab 10/16/21 2138  INR 1.4*   Thyroid Function Tests: No results for input(s): "TSH", "T4TOTAL", "FREET4", "T3FREE", "THYROIDAB" in the last 72  hours. Lipid Profile: No results for input(s): "CHOL", "HDL", "LDLCALC", "TRIG", "CHOLHDL", "LDLDIRECT" in the last 72 hours. Anemia  Panel: No results for input(s): "VITAMINB12", "FOLATE", "FERRITIN", "TIBC", "IRON", "RETICCTPCT" in the last 72 hours. Urine analysis:    Component Value Date/Time   COLORURINE YELLOW 10/16/2021 2244   APPEARANCEUR HAZY (A) 10/16/2021 2244   LABSPEC 1.025 10/16/2021 2244   PHURINE 8.0 10/16/2021 2244   GLUCOSEU >=500 (A) 10/16/2021 2244   HGBUR MODERATE (A) 10/16/2021 2244   BILIRUBINUR NEGATIVE 10/16/2021 2244   KETONESUR NEGATIVE 10/16/2021 2244   PROTEINUR NEGATIVE 10/16/2021 2244   UROBILINOGEN 1.0 10/09/2012 1630   NITRITE NEGATIVE 10/16/2021 2244   LEUKOCYTESUR LARGE (A) 10/16/2021 2244   Sepsis Labs: Invalid input(s): "PROCALCITONIN", "LACTICIDVEN"   SIGNED:  Mercy Riding, MD  [facility] 10/20/2021, 1:26 PM

## 2021-10-20 NOTE — Discharge Instructions (Signed)

## 2021-10-20 NOTE — Progress Notes (Signed)
Occupational Therapy Treatment Patient Details Name: Rachel Santos MRN: [id number] DOB: 1940/07/26 Today's Date: 10/20/2021   History of present illness Patient is 81 y.o. female presented to ED with painless hematochezia that she describes as long blood clot. 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   OT comments  OT treatment session with focus on self-care re-education, functional transfers and household/community mobility in prep for safe return home. Patient currently functioning at Mod I to supervision A overall for observed ADLs including toileting and grooming standing at sink level. D/c disposition remains appropriate. OT will continue to follow acutely.    Recommendations for follow up therapy are one component of a multi-disciplinary discharge planning process, led by the attending physician.  Recommendations may be updated based on patient status, additional functional criteria and insurance authorization.    Follow Up Recommendations  No OT follow up    Assistance Recommended at Discharge Intermittent Supervision/Assistance  Patient can return home with the following  Assistance with cooking/housework;Direct supervision/assist for financial management;Assist for transportation;Help with stairs or ramp for entrance;Direct supervision/assist for medications management   Equipment Recommendations  None recommended by OT    Recommendations for Other Services      Precautions / Restrictions Precautions Precautions: Fall Restrictions Weight Bearing Restrictions: No       Mobility Bed Mobility Overal bed mobility: Modified Independent                  Transfers Overall transfer level: Needs assistance Equipment used: None Transfers: Sit to/from Stand Sit  to Stand: Supervision           General transfer comment: Supervision A for safety.     Balance Overall balance assessment: Needs assistance Sitting-balance support: Feet supported Sitting balance-Leahy Scale: Good     Standing balance support: During functional activity, Single extremity supported, No upper extremity supported Standing balance-Leahy Scale: Fair                             ADL either performed or assessed with clinical judgement   ADL Overall ADL's : Needs assistance/impaired     Grooming: Supervision/safety;Standing;Wash/dry hands;Wash/dry face;Oral care Grooming Details (indicate cue type and reason): 2/3 grooming tasks standing at sink level with supervision A for safety.         Upper Body Dressing : Modified independent   Lower Body Dressing: Supervision/safety   Toilet Transfer: Copy Details (indicate cue type and reason): To standard height commode in bathroom with supervision A. Cues for activity pacing. Toileting- Clothing Manipulation and Hygiene: Supervision/safety;Sit to/from stand Toileting - Clothing Manipulation Details (indicate cue type and reason): 3/3 parts of toileting task with supervision A.            Extremity/Trunk Assessment              Vision       Perception     Praxis      Cognition Arousal/Alertness: Awake/alert Behavior During Therapy: WFL for tasks assessed/performed Overall Cognitive Status: Within Functional Limits for tasks assessed                                 General Comments: HOH; mild impulsivity  Exercises      Shoulder Instructions       General Comments      Pertinent Vitals/ Pain       Pain Assessment Pain Assessment: No/denies pain  Home Living                                          Prior Functioning/Environment              Frequency  Min 2X/week        Progress Toward  Goals  OT Goals(current goals can now be found in the care plan section)  Progress towards OT goals: Progressing toward goals  Acute Rehab OT Goals Patient Stated Goal: To return home. OT Goal Formulation: With patient/family Time For Goal Achievement: 10/31/21 Potential to Achieve Goals: Fair ADL Goals Pt Will Perform Lower Body Dressing: with modified independence;sit to/from stand Pt Will Transfer to Toilet: with modified independence;ambulating;regular height toilet Pt Will Perform Toileting - Clothing Manipulation and hygiene: sit to/from stand;with modified independence Additional ADL Goal #1: Patient will perform 10 min functional activity or exercise activity as evidence of improving activity tolerance  Plan Discharge plan remains appropriate    Co-evaluation                 AM-PAC OT "6 Clicks" Daily Activity     Outcome Measure   Help from another person eating meals?: None Help from another person taking care of personal grooming?: A Little Help from another person toileting, which includes using toliet, bedpan, or urinal?: A Little Help from another person bathing (including washing, rinsing, drying)?: A Little Help from another person to put on and taking off regular upper body clothing?: None Help from another person to put on and taking off regular lower body clothing?: A Little 6 Click Score: 20    End of Session Equipment Utilized During Treatment: Gait belt  OT Visit Diagnosis: Unsteadiness on feet (R26.81);Muscle weakness (generalized) (M62.81)   Activity Tolerance Patient tolerated treatment well   Patient Left in chair;with call bell/phone within reach   Nurse Communication Mobility status        Time: 0712-0723 OT Time Calculation (min): 11 min  Charges: OT General Charges $OT Visit: 1 Visit OT Treatments $Self Care/Home Management : 8-22 mins  Shelba Susi H. OTR/L Supplemental OT, Department of rehab services [phone]  Judi Jaffe R  H. 10/20/2021, 7:53 AM

## 2021-10-25 DIAGNOSIS — I480 Paroxysmal atrial fibrillation: Secondary | ICD-10-CM | POA: Diagnosis not present

## 2021-10-25 DIAGNOSIS — D6869 Other thrombophilia: Secondary | ICD-10-CM | POA: Diagnosis not present

## 2021-10-25 DIAGNOSIS — Z8719 Personal history of other diseases of the digestive system: Secondary | ICD-10-CM | POA: Diagnosis not present

## 2021-10-25 DIAGNOSIS — N1831 Chronic kidney disease, stage 3a: Secondary | ICD-10-CM | POA: Diagnosis not present

## 2021-10-25 DIAGNOSIS — E1121 Type 2 diabetes mellitus with diabetic nephropathy: Secondary | ICD-10-CM | POA: Diagnosis not present

## 2021-10-25 DIAGNOSIS — R413 Other amnesia: Secondary | ICD-10-CM | POA: Diagnosis not present

## 2021-11-01 DIAGNOSIS — D3132 Benign neoplasm of left choroid: Secondary | ICD-10-CM | POA: Diagnosis not present

## 2021-11-01 DIAGNOSIS — H04123 Dry eye syndrome of bilateral lacrimal glands: Secondary | ICD-10-CM | POA: Diagnosis not present

## 2021-11-01 DIAGNOSIS — H52223 Regular astigmatism, bilateral: Secondary | ICD-10-CM | POA: Diagnosis not present

## 2021-11-01 DIAGNOSIS — E119 Type 2 diabetes mellitus without complications: Secondary | ICD-10-CM | POA: Diagnosis not present

## 2021-11-01 DIAGNOSIS — H35033 Hypertensive retinopathy, bilateral: Secondary | ICD-10-CM | POA: Diagnosis not present

## 2021-11-01 DIAGNOSIS — H524 Presbyopia: Secondary | ICD-10-CM | POA: Diagnosis not present

## 2021-11-16 DIAGNOSIS — E1165 Type 2 diabetes mellitus with hyperglycemia: Secondary | ICD-10-CM | POA: Diagnosis not present

## 2021-12-03 ENCOUNTER — Telehealth: Payer: Self-pay

## 2021-12-03 NOTE — Patient Outreach (Signed)
  Care Coordination   12/03/2021 Name: Rachel Santos MRN: [id number] DOB: Aug 08, 1940   Care Coordination Outreach Attempts:  An unsuccessful telephone outreach was attempted today to offer the patient information about available care coordination services as a benefit of their health plan.   Follow Up Plan:  Additional outreach attempts will be made to offer the patient care coordination information and services.   Encounter Outcome:  No Answer  Care Coordination Interventions Activated:  No   Care Coordination Interventions:  No, not indicated    [facility] [phone]

## 2021-12-19 ENCOUNTER — Other Ambulatory Visit: Payer: Self-pay

## 2021-12-19 ENCOUNTER — Observation Stay (HOSPITAL_COMMUNITY)
Admission: EM | Admit: 2021-12-19 | Discharge: 2021-12-24 | Disposition: A | Discharge status: Skilled Nursing Facility | Payer: Medicare Other | Attending: Internal Medicine | Admitting: Internal Medicine

## 2021-12-19 ENCOUNTER — Emergency Department (HOSPITAL_COMMUNITY): Payer: Medicare Other

## 2021-12-19 ENCOUNTER — Encounter (HOSPITAL_COMMUNITY): Payer: Self-pay

## 2021-12-19 DIAGNOSIS — S42309A Unspecified fracture of shaft of humerus, unspecified arm, initial encounter for closed fracture: Secondary | ICD-10-CM

## 2021-12-19 DIAGNOSIS — Z8673 Personal history of transient ischemic attack (TIA), and cerebral infarction without residual deficits: Secondary | ICD-10-CM | POA: Diagnosis not present

## 2021-12-19 DIAGNOSIS — W010XXA Fall on same level from slipping, tripping and stumbling without subsequent striking against object, initial encounter: Secondary | ICD-10-CM | POA: Insufficient documentation

## 2021-12-19 DIAGNOSIS — Z23 Encounter for immunization: Secondary | ICD-10-CM | POA: Insufficient documentation

## 2021-12-19 DIAGNOSIS — Z853 Personal history of malignant neoplasm of breast: Secondary | ICD-10-CM | POA: Diagnosis not present

## 2021-12-19 DIAGNOSIS — E875 Hyperkalemia: Secondary | ICD-10-CM | POA: Insufficient documentation

## 2021-12-19 DIAGNOSIS — R2681 Unsteadiness on feet: Secondary | ICD-10-CM | POA: Diagnosis not present

## 2021-12-19 DIAGNOSIS — E1165 Type 2 diabetes mellitus with hyperglycemia: Secondary | ICD-10-CM | POA: Insufficient documentation

## 2021-12-19 DIAGNOSIS — W19XXXA Unspecified fall, initial encounter: Secondary | ICD-10-CM | POA: Diagnosis present

## 2021-12-19 DIAGNOSIS — G3184 Mild cognitive impairment, so stated: Secondary | ICD-10-CM | POA: Diagnosis not present

## 2021-12-19 DIAGNOSIS — M6281 Muscle weakness (generalized): Secondary | ICD-10-CM | POA: Diagnosis not present

## 2021-12-19 DIAGNOSIS — S42211A Unspecified displaced fracture of surgical neck of right humerus, initial encounter for closed fracture: Principal | ICD-10-CM | POA: Insufficient documentation

## 2021-12-19 DIAGNOSIS — S0083XA Contusion of other part of head, initial encounter: Secondary | ICD-10-CM | POA: Insufficient documentation

## 2021-12-19 DIAGNOSIS — I639 Cerebral infarction, unspecified: Secondary | ICD-10-CM | POA: Diagnosis not present

## 2021-12-19 DIAGNOSIS — M25551 Pain in right hip: Secondary | ICD-10-CM | POA: Diagnosis not present

## 2021-12-19 DIAGNOSIS — I10 Essential (primary) hypertension: Secondary | ICD-10-CM | POA: Diagnosis not present

## 2021-12-19 DIAGNOSIS — Z79899 Other long term (current) drug therapy: Secondary | ICD-10-CM | POA: Diagnosis not present

## 2021-12-19 DIAGNOSIS — N1831 Chronic kidney disease, stage 3a: Secondary | ICD-10-CM | POA: Diagnosis not present

## 2021-12-19 DIAGNOSIS — I480 Paroxysmal atrial fibrillation: Secondary | ICD-10-CM | POA: Diagnosis not present

## 2021-12-19 DIAGNOSIS — Z954 Presence of other heart-valve replacement: Secondary | ICD-10-CM | POA: Diagnosis not present

## 2021-12-19 DIAGNOSIS — Z952 Presence of prosthetic heart valve: Secondary | ICD-10-CM

## 2021-12-19 DIAGNOSIS — E118 Type 2 diabetes mellitus with unspecified complications: Secondary | ICD-10-CM

## 2021-12-19 DIAGNOSIS — Z7901 Long term (current) use of anticoagulants: Secondary | ICD-10-CM | POA: Diagnosis not present

## 2021-12-19 DIAGNOSIS — S32511A Fracture of superior rim of right pubis, initial encounter for closed fracture: Secondary | ICD-10-CM | POA: Diagnosis not present

## 2021-12-19 DIAGNOSIS — S42251A Displaced fracture of greater tuberosity of right humerus, initial encounter for closed fracture: Secondary | ICD-10-CM | POA: Diagnosis not present

## 2021-12-19 DIAGNOSIS — S32501A Unspecified fracture of right pubis, initial encounter for closed fracture: Secondary | ICD-10-CM | POA: Diagnosis not present

## 2021-12-19 DIAGNOSIS — J320 Chronic maxillary sinusitis: Secondary | ICD-10-CM | POA: Diagnosis not present

## 2021-12-19 DIAGNOSIS — S32591A Other specified fracture of right pubis, initial encounter for closed fracture: Secondary | ICD-10-CM

## 2021-12-19 DIAGNOSIS — I672 Cerebral atherosclerosis: Secondary | ICD-10-CM | POA: Diagnosis not present

## 2021-12-19 DIAGNOSIS — I2510 Atherosclerotic heart disease of native coronary artery without angina pectoris: Secondary | ICD-10-CM | POA: Diagnosis not present

## 2021-12-19 DIAGNOSIS — Z86718 Personal history of other venous thrombosis and embolism: Secondary | ICD-10-CM | POA: Insufficient documentation

## 2021-12-19 DIAGNOSIS — Z794 Long term (current) use of insulin: Secondary | ICD-10-CM | POA: Insufficient documentation

## 2021-12-19 DIAGNOSIS — S4991XA Unspecified injury of right shoulder and upper arm, initial encounter: Secondary | ICD-10-CM | POA: Diagnosis present

## 2021-12-19 DIAGNOSIS — E1122 Type 2 diabetes mellitus with diabetic chronic kidney disease: Secondary | ICD-10-CM | POA: Diagnosis not present

## 2021-12-19 DIAGNOSIS — Z9889 Other specified postprocedural states: Secondary | ICD-10-CM

## 2021-12-19 DIAGNOSIS — Z7984 Long term (current) use of oral hypoglycemic drugs: Secondary | ICD-10-CM | POA: Insufficient documentation

## 2021-12-19 DIAGNOSIS — I129 Hypertensive chronic kidney disease with stage 1 through stage 4 chronic kidney disease, or unspecified chronic kidney disease: Secondary | ICD-10-CM | POA: Insufficient documentation

## 2021-12-19 DIAGNOSIS — F418 Other specified anxiety disorders: Secondary | ICD-10-CM | POA: Diagnosis present

## 2021-12-19 DIAGNOSIS — Z8679 Personal history of other diseases of the circulatory system: Secondary | ICD-10-CM

## 2021-12-19 LAB — BASIC METABOLIC PANEL
Anion gap: 9 (ref 5–15)
BUN: 37 mg/dL — ABNORMAL HIGH (ref 8–23)
CO2: 24 mmol/L (ref 22–32)
Calcium: 9.3 mg/dL (ref 8.9–10.3)
Chloride: 102 mmol/L (ref 98–111)
Creatinine, Ser: 1.14 mg/dL — ABNORMAL HIGH (ref 0.44–1.00)
GFR, Estimated: 48 mL/min — ABNORMAL LOW (ref >60)
Glucose, Bld: 375 mg/dL — ABNORMAL HIGH (ref 70–99)
Potassium: 5.3 mmol/L — ABNORMAL HIGH (ref 3.5–5.1)
Sodium: 135 mmol/L (ref 135–145)

## 2021-12-19 LAB — CBC
HCT: 41.5 % (ref 36.0–46.0)
Hemoglobin: 13.6 g/dL (ref 12.0–15.0)
MCH: 29.6 pg (ref 26.0–34.0)
MCHC: 32.8 g/dL (ref 30.0–36.0)
MCV: 90.2 fL (ref 80.0–100.0)
Platelets: 200 10*3/uL (ref 150–400)
RBC: 4.6 MIL/uL (ref 3.87–5.11)
RDW: 12.9 % (ref 11.5–15.5)
WBC: 18.9 10*3/uL — ABNORMAL HIGH (ref 4.0–10.5)
nRBC: 0 % (ref 0.0–0.2)

## 2021-12-19 LAB — CBG MONITORING, ED: Glucose-Capillary: 374 mg/dL — ABNORMAL HIGH (ref 70–99)

## 2021-12-19 MED ORDER — ATORVASTATIN CALCIUM 40 MG PO TABS: 80.0000 mg | ORAL_TABLET | Freq: Every day | ORAL | Status: DC

## 2021-12-19 MED ORDER — PANTOPRAZOLE SODIUM 40 MG PO TBEC: 40.0000 mg | DELAYED_RELEASE_TABLET | Freq: Every day | ORAL | Status: DC

## 2021-12-19 MED ORDER — ACETAMINOPHEN 325 MG PO TABS: 650.0000 mg | ORAL_TABLET | ORAL | Status: DC | PRN

## 2021-12-19 MED ORDER — DAPAGLIFLOZIN PROPANEDIOL 10 MG PO TABS: 10.0000 mg | ORAL_TABLET | Freq: Every day | ORAL | Status: DC

## 2021-12-19 MED ORDER — APIXABAN 2.5 MG PO TABS
2.5000 mg | ORAL_TABLET | Freq: Two times a day (BID) | ORAL | Status: DC
  Administered 2021-12-19: 2.5 mg via ORAL
  Filled 2021-12-19: qty 1

## 2021-12-19 MED ORDER — ACETAMINOPHEN 325 MG PO TABS
650.0000 mg | ORAL_TABLET | Freq: Once | ORAL | Status: AC
  Administered 2021-12-19: 650 mg via ORAL
  Filled 2021-12-19: qty 2

## 2021-12-19 MED ORDER — POLYETHYLENE GLYCOL 3350 17 G PO PACK: 17.0000 g | PACK | Freq: Every day | ORAL | Status: DC | PRN

## 2021-12-19 MED ORDER — VENLAFAXINE HCL ER 75 MG PO CP24: 225.0000 mg | ORAL_CAPSULE | Freq: Every day | ORAL | Status: DC

## 2021-12-19 MED ORDER — INSULIN DETEMIR 100 UNIT/ML ~~LOC~~ SOLN
7.0000 [IU] | Freq: Two times a day (BID) | SUBCUTANEOUS | Status: DC
  Administered 2021-12-19 – 2021-12-21 (×4): 7 [IU] via SUBCUTANEOUS
  Filled 2021-12-19 (×4): qty 0.07

## 2021-12-19 MED ORDER — DOCUSATE SODIUM 100 MG PO CAPS: 100.0000 mg | ORAL_CAPSULE | Freq: Every day | ORAL | Status: DC | PRN

## 2021-12-19 MED ORDER — FENTANYL CITRATE PF 50 MCG/ML IJ SOSY
50.0000 ug | PREFILLED_SYRINGE | Freq: Once | INTRAMUSCULAR | Status: AC
  Administered 2021-12-20: 50 ug via INTRAVENOUS
  Filled 2021-12-19: qty 1

## 2021-12-19 NOTE — ED Provider Notes (Signed)
Patient with history of hypertension, atrial fibrillation on anticoagulation, diabetes, prior CVA, TAVR here for evaluation following a fall.  She has a right proximal humerus fracture as well as a pubic ramus fracture.  She does live alone.  Care assumed pending labs.  Labs significant for mild AKI and hyperkalemia.  EKG is similar when compared to priors., CBC with leukocytosis.  No acute infectious symptoms.  We will add on urinalysis, hepatic panel.  Medicine consulted for admission for ongoing care.   Quintella Reichert, MD 12/20/21 [phone]

## 2021-12-19 NOTE — ED Provider Triage Note (Signed)
Emergency Medicine Provider Triage Evaluation Note  Rachel Santos , a 81 y.o. female  was evaluated in triage.  Pt complains of fall.  Patient does not know when it happened.  Reports she was on the ground for multiple hours.  Complaining of right shoulder and humerus pain.  Patient does not remember the fall.  Patient is on blood thinners.  Review of Systems  Positive: As above Negative: Cervical spine tenderness, headache, vision changes  Physical Exam  BP 116/64 (BP Location: Right Arm)   Pulse 81   Temp 97.8 F (36.6 C) (Oral)   Resp 16   SpO2 94%  Gen:   Awake, no distress   Resp:  Normal effort  MSK:   Moves extremities without difficulty  Other:  Bruising noted around the right eye  Medical Decision Making  Medically screening exam initiated at 8:22 PM.  Appropriate orders placed.  Rachel Santos was informed that the remainder of the evaluation will be completed by another provider, this initial triage assessment does not replace that evaluation, and the importance of remaining in the ED until their evaluation is complete.  We will obtain CT head, x-ray right shoulder and right humerus.  Patient is a fall on thinners with obvious head trauma.  Will be brought back to the ED as soon as possible.   Roylene Reason, [place] 12/19/21 2023

## 2021-12-19 NOTE — ED Notes (Signed)
EDP at bedside  

## 2021-12-19 NOTE — ED Triage Notes (Addendum)
Pt reports with injuries to her right arm and right shoulder from a fall. Pt has bruising to her right forehead and right eye. Daughters states that pt has a walker and cane but will not use them. Pt lives alone but her daughters come in and help.

## 2021-12-19 NOTE — ED Provider Notes (Signed)
[facility] DEPT Provider Note   CSN: [id number] Arrival date & time: 12/19/21  1947     History  Chief Complaint  Patient presents with   Rachel Santos is a 81 y.o. female.  Patient is an 81 year old female with a past medical history of hypertension, diabetes, paroxysmal A-fib on Eliquis, prior stroke presenting to the emergency department for a fall.  The patient states that she was walking in her house today without her cane and walker and tripped and fell onto her right side.  She denies hitting her head or losing consciousness.  She states that she is having significant pain in her right shoulder.  She denies any numbness or weakness.  She denies feeling lightheaded or dizzy, having any chest pain or shortness of breath prior to the fall.  She states she has not taken anything for pain today.  The history is provided by the patient and a relative.  Fall       Home Medications Prior to Admission medications   Medication Sig Start Date End Date Taking? Authorizing Provider  acetaminophen (TYLENOL) 500 MG tablet Take 1,000 mg by mouth every 6 (six) hours as needed for moderate pain or headache.    [provider]  apixaban (ELIQUIS) 2.5 MG TABS tablet Take 1 tablet (2.5 mg total) by mouth 2 (two) times daily. 10/20/21   Mercy Riding, MD  atorvastatin (LIPITOR) 80 MG tablet Take 1 tablet by mouth once daily 10/06/21   [name], MD  Calcium Carb-Cholecalciferol (CALCIUM 600 + D PO) Take 1 tablet by mouth 2 (two) times daily.    [provider]  dapagliflozin propanediol (FARXIGA) 10 MG TABS tablet Take 10 mg by mouth daily. 03/03/20   [provider]  docusate sodium (COLACE) 100 MG capsule Take 100-200 mg by mouth daily as needed for mild constipation.    [provider]  insulin detemir (LEVEMIR) 100 UNIT/ML injection Inject 0.07 mLs (7 Units total) into the skin 2 (two) times daily. After  breakfast and dinner 10/20/21   Mercy Riding, MD  Multiple Vitamins-Minerals (MULTIVITAMIN WITH MINERALS) tablet Take 1 tablet by mouth daily.    [provider]  nitroGLYCERIN (NITROSTAT) 0.4 MG SL tablet Place 1 tablet (0.4 mg total) under the tongue every 5 (five) minutes as needed for chest pain. Patient not taking: Reported on 10/16/2021 09/08/20 10/17/22  Richardson Dopp T, PA-C  omeprazole (PRILOSEC) 40 MG capsule Take 40 mg by mouth daily before breakfast.  06/28/12   [provider]  polyethylene glycol powder (MIRALAX) 17 GM/SCOOP powder Take 17 g by mouth 2 (two) times daily as needed for moderate constipation or mild constipation. 10/20/21   Mercy Riding, MD  venlafaxine XR (EFFEXOR-XR) 150 MG 24 hr capsule Take 150 mg by mouth daily with breakfast. Take with 75 mg capsule to equal 225 mg daily    [provider]  venlafaxine XR (EFFEXOR-XR) 75 MG 24 hr capsule Take 75 mg by mouth daily with breakfast. Take with 150 mg capsule to equal 225 mg daily    [provider]  vitamin B-12 (CYANOCOBALAMIN) 500 MCG tablet Take 500 mcg by mouth every Monday.    [provider]      Allergies    Buspirone hcl, Metformin and related, Ozempic (0.25 or 0.5 mg-dose) [semaglutide(0.25 or 0.'5mg'$ -dos)], Ultram [tramadol], Nickel, and Pneumococcal vaccines    Review of Systems   Review of Systems  Physical Exam Updated Vital Signs BP 116/64 (BP Location: Right Arm)   Pulse 81   Temp 97.8 F (36.6 C) (Oral)   Resp 16   Ht '5\' 6"'$  (1.676 m)   Wt 66.2 kg   SpO2 94%   BMI 23.57 kg/m  Physical Exam Vitals and nursing note reviewed.  Constitutional:      Appearance: Normal appearance.     Comments: Uncomfortable appearing  HENT:     Head: Normocephalic.     Comments: Contusion to right forehead and around right eye No orbital bony tenderness to palpation    Nose: Nose normal.     Mouth/Throat:     Mouth: Mucous membranes are moist.     Pharynx: Oropharynx  is clear.  Eyes:     Extraocular Movements: Extraocular movements intact.     Conjunctiva/sclera: Conjunctivae normal.     Pupils: Pupils are equal, round, and reactive to light.  Neck:     Comments: No midline neck tenderness Cardiovascular:     Rate and Rhythm: Normal rate and regular rhythm.     Pulses: Normal pulses.     Heart sounds: Normal heart sounds.  Pulmonary:     Effort: Pulmonary effort is normal.     Breath sounds: Normal breath sounds.  Abdominal:     General: Abdomen is flat.     Palpations: Abdomen is soft.     Tenderness: There is no abdominal tenderness.  Musculoskeletal:     Cervical back: Normal range of motion and neck supple.     Comments: Significant bruising and tenderness to palpation of right upper arm, no right elbow tenderness or forearm no right hand tenderness, limited ROM of right elbow secondary to shoulder pain Tenderness to palpation of right greater trochanter, no hip pain with internal/external rotation, full hip ROM Pelvis stable No midline back tenderness No tenderness to palpation of left upper and lower extremity  Skin:    General: Skin is warm and dry.  Neurological:     General: No focal deficit present.     Mental Status: She is alert and oriented to person, place, and time.     Cranial Nerves: No cranial nerve deficit.     Sensory: No sensory deficit.     Motor: No weakness.  Psychiatric:        Mood and Affect: Mood normal.     ED Results / Procedures / Treatments   Labs (all labs ordered are listed, but only abnormal results are displayed) Labs Reviewed  CBC  BASIC METABOLIC PANEL    EKG None  Radiology DG Hip Unilat With Pelvis 2-3 Views Right  Result Date: 12/19/2021 CLINICAL DATA:  Fall, right hip pain EXAM: DG HIP (WITH OR WITHOUT PELVIS) 2-3V RIGHT COMPARISON:  None FINDINGS: Fracture through the right superior pubic ramus. No proximal femoral fracture. No subluxation or dislocation. SI joints symmetric and  unremarkable. IMPRESSION: Fracture through the right superior pubic ramus. Electronically Signed   By: Rolm Baptise M.D.   On: 12/19/2021 22:34   DG Humerus Right  Result Date: 12/19/2021 CLINICAL DATA:  Right shoulder injury.  Fell. EXAM: RIGHT HUMERUS - 2+ VIEW; RIGHT SHOULDER - 2+ VIEW COMPARISON:  None Available. FINDINGS: Mild osteopenia. There is acute impacted transverse surgical neck fracture of the proximal humerus with comminution between fracture margins and along the greater tuberosity. The distal fragment is translated anteriorly about 1/3 of a shaft width, with mild posterolateral distal-fragment angulation as well. No dislocation is seen.  The [facility] joint is intact with circumferential spurring. Median sternotomy sutures are partially visible. The visualized right lung fields are clear. There is a chronic healed fracture deformity of the posterolateral right seventh rib. IMPRESSION: 1. Acute transverse surgical neck proximal right humeral fracture with impaction, comminution between fracture margins and along the greater tuberosity, and mild anterior translation of the distal fragment as well as mild posterolateral angulation. 2. Osteopenia and degenerative change. Electronically Signed   By: Telford Nab M.D.   On: 12/19/2021 20:54   DG Shoulder Right  Result Date: 12/19/2021 CLINICAL DATA:  Right shoulder injury.  Fell. EXAM: RIGHT HUMERUS - 2+ VIEW; RIGHT SHOULDER - 2+ VIEW COMPARISON:  None Available. FINDINGS: Mild osteopenia. There is acute impacted transverse surgical neck fracture of the proximal humerus with comminution between fracture margins and along the greater tuberosity. The distal fragment is translated anteriorly about 1/3 of a shaft width, with mild posterolateral distal-fragment angulation as well. No dislocation is seen. The [facility] joint is intact with circumferential spurring. Median sternotomy sutures are partially visible. The visualized right lung fields are clear. There is  a chronic healed fracture deformity of the posterolateral right seventh rib. IMPRESSION: 1. Acute transverse surgical neck proximal right humeral fracture with impaction, comminution between fracture margins and along the greater tuberosity, and mild anterior translation of the distal fragment as well as mild posterolateral angulation. 2. Osteopenia and degenerative change. Electronically Signed   By: Telford Nab M.D.   On: 12/19/2021 20:54   CT Head Wo Contrast  Result Date: 12/19/2021 CLINICAL DATA:  Patient fell but is unsure of when EXAM: CT HEAD WITHOUT CONTRAST TECHNIQUE: Contiguous axial images were obtained from the base of the skull through the vertex without intravenous contrast. RADIATION DOSE REDUCTION: This exam was performed according to the departmental dose-optimization program which includes automated exposure control, adjustment of the mA and/or kV according to patient size and/or use of iterative reconstruction technique. COMPARISON:  CT 01/13/2021 FINDINGS: Brain: No intracranial hemorrhage, mass effect, or evidence of acute infarct. No hydrocephalus. No extra-axial fluid collection. Generalized cerebral atrophy. Ill-defined hypoattenuation within the cerebral white matter is nonspecific but consistent with chronic small vessel ischemic disease. Chronic left cerebellar infarct. Vascular: No hyperdense vessel. Intracranial arterial calcification. Skull: No fracture or focal lesion. Sinuses/Orbits: No acute finding. Trace air-fluid level in the right maxillary sinus with some frothy secretions anteriorly. Small air-fluid level in the left maxillary sinus. Other: None. IMPRESSION: No acute intracranial abnormality. Age-related atrophy and chronic microvascular ischemic change. Air-fluid levels in the maxillary sinuses suggestive acute sinusitis. Electronically Signed   By: Placido Sou M.D.   On: 12/19/2021 20:39    Procedures Procedures    Medications Ordered in ED Medications   acetaminophen (TYLENOL) tablet 650 mg (has no administration in time range)  apixaban (ELIQUIS) tablet 2.5 mg (has no administration in time range)  atorvastatin (LIPITOR) tablet 80 mg (has no administration in time range)  docusate sodium (COLACE) capsule 100 mg (has no administration in time range)  dapagliflozin propanediol (FARXIGA) tablet 10 mg (has no administration in time range)  insulin detemir (LEVEMIR) injection 7 Units (has no administration in time range)  pantoprazole (PROTONIX) EC tablet 40 mg (has no administration in time range)  polyethylene glycol (MIRALAX / GLYCOLAX) packet 17 g (has no administration in time range)  venlafaxine XR (EFFEXOR-XR) 24 hr capsule 225 mg (has no administration in time range)  acetaminophen (TYLENOL) tablet 650 mg (650 mg Oral Given 12/19/21 2207)  ED Course/ Medical Decision Making/ A&P Clinical Course as of 12/19/21 2315  Nancy Fetter Dec 19, 2021  2313 Patient's pelvis x-ray shows pelvic rami fracture.  The patient does not feel like she can ambulate due to her pain.  She lives at home alone.  Patient will require admission for pain control and PT eval.  She was signed out to Dr. Ralene Bathe pending labs and admission. [VK]    Clinical Course User Index [VK] Ottie Glazier, DO                           Medical Decision Making This patient presents to the ED with chief complaint(s) of fall with pertinent past medical history of A-fib on Eliquis, hypertension, diabetes, prior CVA which further complicates the presenting complaint. The complaint involves an extensive differential diagnosis and also carries with it a high risk of complications and morbidity.    The differential diagnosis includes ICH, mass effect, fracture, dislocation, patient had no presyncopal symptoms making a syncopal fall unlikely  Additional history obtained: Additional history obtained from family Records reviewed previous admission documents  ED Course and  Reassessment: She was initially evaluated by provider in triage and had CT head performed as well as right shoulder x-ray.  Patient CT head was negative for acute traumatic injury.  She has no facial bony tenderness to palpation and full extraocular motions making a facial fracture unlikely.  The patient's x-ray of her shoulder did show a right humeral neck fracture.  She will be placed in a sling.  She has no evidence of neurovascular injury.  She also has tenderness to palpation of her right hip and will have right hip x-rays performed.  She was given Tylenol and ice pack for pain and will be reassessed.  Independent labs interpretation:  The following labs were independently interpreted: pending  Independent visualization of imaging: - I independently visualized the following imaging with scope of interpretation limited to determining acute life threatening conditions related to emergency care: CT head, right shoulder/humerus x-ray, pelvis x-ray, which revealed CT head negative, shoulder x-ray positive for humeral head neck fracture, wrist x-ray positive for pubic ramus fracture  Consultation: - Consulted or discussed management/test interpretation w/ external professional: N/A     Amount and/or Complexity of Data Reviewed Labs: ordered. Radiology: ordered.  Risk OTC drugs. Prescription drug management.           Final Clinical Impression(s) / ED Diagnoses Final diagnoses:  Fall, initial encounter  Closed displaced fracture of surgical neck of right humerus, unspecified fracture morphology, initial encounter  Contusion of face, initial encounter  Closed fracture of ramus of right pubis, initial encounter [facility])    Rx / [place] Orders ED Discharge Orders     None         Ottie Glazier, DO 12/19/21 2315

## 2021-12-20 ENCOUNTER — Encounter (HOSPITAL_COMMUNITY): Payer: Self-pay | Admitting: Internal Medicine

## 2021-12-20 DIAGNOSIS — Z954 Presence of other heart-valve replacement: Secondary | ICD-10-CM | POA: Diagnosis not present

## 2021-12-20 DIAGNOSIS — N1831 Chronic kidney disease, stage 3a: Secondary | ICD-10-CM | POA: Diagnosis not present

## 2021-12-20 DIAGNOSIS — Z23 Encounter for immunization: Secondary | ICD-10-CM | POA: Diagnosis not present

## 2021-12-20 DIAGNOSIS — I480 Paroxysmal atrial fibrillation: Secondary | ICD-10-CM | POA: Diagnosis not present

## 2021-12-20 DIAGNOSIS — Z8679 Personal history of other diseases of the circulatory system: Secondary | ICD-10-CM

## 2021-12-20 DIAGNOSIS — S42211A Unspecified displaced fracture of surgical neck of right humerus, initial encounter for closed fracture: Secondary | ICD-10-CM | POA: Diagnosis not present

## 2021-12-20 DIAGNOSIS — Z8673 Personal history of transient ischemic attack (TIA), and cerebral infarction without residual deficits: Secondary | ICD-10-CM

## 2021-12-20 DIAGNOSIS — I2510 Atherosclerotic heart disease of native coronary artery without angina pectoris: Secondary | ICD-10-CM | POA: Diagnosis not present

## 2021-12-20 DIAGNOSIS — Z853 Personal history of malignant neoplasm of breast: Secondary | ICD-10-CM | POA: Diagnosis not present

## 2021-12-20 DIAGNOSIS — S0083XA Contusion of other part of head, initial encounter: Secondary | ICD-10-CM | POA: Diagnosis not present

## 2021-12-20 DIAGNOSIS — Z79899 Other long term (current) drug therapy: Secondary | ICD-10-CM | POA: Diagnosis not present

## 2021-12-20 DIAGNOSIS — Z9889 Other specified postprocedural states: Secondary | ICD-10-CM | POA: Diagnosis not present

## 2021-12-20 DIAGNOSIS — S32501A Unspecified fracture of right pubis, initial encounter for closed fracture: Secondary | ICD-10-CM | POA: Diagnosis not present

## 2021-12-20 DIAGNOSIS — E118 Type 2 diabetes mellitus with unspecified complications: Secondary | ICD-10-CM | POA: Diagnosis not present

## 2021-12-20 DIAGNOSIS — G3184 Mild cognitive impairment, so stated: Secondary | ICD-10-CM | POA: Diagnosis not present

## 2021-12-20 DIAGNOSIS — W19XXXA Unspecified fall, initial encounter: Secondary | ICD-10-CM

## 2021-12-20 DIAGNOSIS — E1122 Type 2 diabetes mellitus with diabetic chronic kidney disease: Secondary | ICD-10-CM | POA: Diagnosis not present

## 2021-12-20 DIAGNOSIS — I129 Hypertensive chronic kidney disease with stage 1 through stage 4 chronic kidney disease, or unspecified chronic kidney disease: Secondary | ICD-10-CM | POA: Diagnosis not present

## 2021-12-20 DIAGNOSIS — I10 Essential (primary) hypertension: Secondary | ICD-10-CM | POA: Diagnosis not present

## 2021-12-20 DIAGNOSIS — Z794 Long term (current) use of insulin: Secondary | ICD-10-CM | POA: Diagnosis not present

## 2021-12-20 DIAGNOSIS — Z952 Presence of prosthetic heart valve: Secondary | ICD-10-CM

## 2021-12-20 DIAGNOSIS — F418 Other specified anxiety disorders: Secondary | ICD-10-CM

## 2021-12-20 DIAGNOSIS — S42309A Unspecified fracture of shaft of humerus, unspecified arm, initial encounter for closed fracture: Secondary | ICD-10-CM

## 2021-12-20 DIAGNOSIS — Z7984 Long term (current) use of oral hypoglycemic drugs: Secondary | ICD-10-CM | POA: Diagnosis not present

## 2021-12-20 DIAGNOSIS — S32591A Other specified fracture of right pubis, initial encounter for closed fracture: Secondary | ICD-10-CM

## 2021-12-20 DIAGNOSIS — Z86718 Personal history of other venous thrombosis and embolism: Secondary | ICD-10-CM | POA: Diagnosis not present

## 2021-12-20 DIAGNOSIS — E875 Hyperkalemia: Secondary | ICD-10-CM | POA: Diagnosis not present

## 2021-12-20 DIAGNOSIS — E1165 Type 2 diabetes mellitus with hyperglycemia: Secondary | ICD-10-CM | POA: Diagnosis not present

## 2021-12-20 DIAGNOSIS — M6281 Muscle weakness (generalized): Secondary | ICD-10-CM | POA: Diagnosis not present

## 2021-12-20 DIAGNOSIS — Z7901 Long term (current) use of anticoagulants: Secondary | ICD-10-CM | POA: Diagnosis not present

## 2021-12-20 DIAGNOSIS — R2681 Unsteadiness on feet: Secondary | ICD-10-CM | POA: Diagnosis not present

## 2021-12-20 LAB — CBC
HCT: 31.3 % — ABNORMAL LOW (ref 36.0–46.0)
Hemoglobin: 10.3 g/dL — ABNORMAL LOW (ref 12.0–15.0)
MCH: 30 pg (ref 26.0–34.0)
MCHC: 32.9 g/dL (ref 30.0–36.0)
MCV: 91.3 fL (ref 80.0–100.0)
Platelets: 144 10*3/uL — ABNORMAL LOW (ref 150–400)
RBC: 3.43 MIL/uL — ABNORMAL LOW (ref 3.87–5.11)
RDW: 12.9 % (ref 11.5–15.5)
WBC: 12.5 10*3/uL — ABNORMAL HIGH (ref 4.0–10.5)
nRBC: 0 % (ref 0.0–0.2)

## 2021-12-20 LAB — CBG MONITORING, ED
Glucose-Capillary: 190 mg/dL — ABNORMAL HIGH (ref 70–99)
Glucose-Capillary: 295 mg/dL — ABNORMAL HIGH (ref 70–99)

## 2021-12-20 LAB — COMPREHENSIVE METABOLIC PANEL
ALT: 24 U/L (ref 0–44)
AST: 24 U/L (ref 15–41)
Albumin: 3.2 g/dL — ABNORMAL LOW (ref 3.5–5.0)
Alkaline Phosphatase: 62 U/L (ref 38–126)
Anion gap: 8 (ref 5–15)
BUN: 38 mg/dL — ABNORMAL HIGH (ref 8–23)
CO2: 24 mmol/L (ref 22–32)
Calcium: 8.5 mg/dL — ABNORMAL LOW (ref 8.9–10.3)
Chloride: 104 mmol/L (ref 98–111)
Creatinine, Ser: 1.07 mg/dL — ABNORMAL HIGH (ref 0.44–1.00)
GFR, Estimated: 52 mL/min — ABNORMAL LOW (ref >60)
Glucose, Bld: 255 mg/dL — ABNORMAL HIGH (ref 70–99)
Potassium: 4.9 mmol/L (ref 3.5–5.1)
Sodium: 136 mmol/L (ref 135–145)
Total Bilirubin: 0.5 mg/dL (ref 0.3–1.2)
Total Protein: 6.3 g/dL — ABNORMAL LOW (ref 6.5–8.1)

## 2021-12-20 LAB — GLUCOSE, CAPILLARY
Glucose-Capillary: 238 mg/dL — ABNORMAL HIGH (ref 70–99)
Glucose-Capillary: 309 mg/dL — ABNORMAL HIGH (ref 70–99)

## 2021-12-20 LAB — URINALYSIS, ROUTINE W REFLEX MICROSCOPIC
Bilirubin Urine: NEGATIVE
Glucose, UA: >=500 mg/dL — AB
Hgb urine dipstick: NEGATIVE
Ketones, ur: NEGATIVE mg/dL
Nitrite: NEGATIVE
Protein, ur: NEGATIVE mg/dL
Specific Gravity, Urine: 1.027 (ref 1.005–1.030)
pH: 6 (ref 5.0–8.0)

## 2021-12-20 LAB — HEPATIC FUNCTION PANEL
ALT: 30 U/L (ref 0–44)
AST: 31 U/L (ref 15–41)
Albumin: 3.6 g/dL (ref 3.5–5.0)
Alkaline Phosphatase: 71 U/L (ref 38–126)
Bilirubin, Direct: 0.1 mg/dL (ref 0.0–0.2)
Indirect Bilirubin: 0.8 mg/dL (ref 0.3–0.9)
Total Bilirubin: 0.9 mg/dL (ref 0.3–1.2)
Total Protein: 6.7 g/dL (ref 6.5–8.1)

## 2021-12-20 LAB — TROPONIN I (HIGH SENSITIVITY)
Troponin I (High Sensitivity): 12 ng/L (ref <18)
Troponin I (High Sensitivity): 12 ng/L (ref <18)

## 2021-12-20 MED ORDER — SODIUM CHLORIDE 0.9% FLUSH
3.0000 mL | Freq: Two times a day (BID) | INTRAVENOUS | Status: DC
  Administered 2021-12-20 – 2021-12-24 (×9): 3 mL via INTRAVENOUS

## 2021-12-20 MED ORDER — ACETAMINOPHEN 650 MG RE SUPP: 650.0000 mg | Freq: Four times a day (QID) | RECTAL | Status: DC | PRN

## 2021-12-20 MED ORDER — SODIUM CHLORIDE 0.9 % IV BOLUS
250.0000 mL | Freq: Once | INTRAVENOUS | Status: AC
  Administered 2021-12-20: 250 mL via INTRAVENOUS

## 2021-12-20 MED ORDER — VENLAFAXINE HCL ER 75 MG PO CP24
75.0000 mg | ORAL_CAPSULE | Freq: Every day | ORAL | Status: DC
  Administered 2021-12-20 – 2021-12-24 (×5): 75 mg via ORAL
  Filled 2021-12-20 (×5): qty 1

## 2021-12-20 MED ORDER — HYDROMORPHONE HCL 1 MG/ML IJ SOLN
0.5000 mg | INTRAMUSCULAR | Status: DC | PRN
  Administered 2021-12-20: 0.5 mg via INTRAVENOUS
  Filled 2021-12-20: qty 1

## 2021-12-20 MED ORDER — APIXABAN 2.5 MG PO TABS
2.5000 mg | ORAL_TABLET | Freq: Two times a day (BID) | ORAL | Status: DC
  Administered 2021-12-20 – 2021-12-24 (×9): 2.5 mg via ORAL
  Filled 2021-12-20 (×9): qty 1

## 2021-12-20 MED ORDER — VENLAFAXINE HCL ER 150 MG PO CP24
150.0000 mg | ORAL_CAPSULE | Freq: Every day | ORAL | Status: DC
  Administered 2021-12-20 – 2021-12-24 (×5): 150 mg via ORAL
  Filled 2021-12-20: qty 1
  Filled 2021-12-20: qty 2
  Filled 2021-12-20 (×3): qty 1

## 2021-12-20 MED ORDER — HYDROCODONE-ACETAMINOPHEN 5-325 MG PO TABS
1.0000 | ORAL_TABLET | ORAL | Status: DC | PRN
  Administered 2021-12-20 – 2021-12-21 (×4): 1 via ORAL
  Filled 2021-12-20 (×4): qty 1

## 2021-12-20 MED ORDER — ATORVASTATIN CALCIUM 40 MG PO TABS
80.0000 mg | ORAL_TABLET | Freq: Every day | ORAL | Status: DC
  Administered 2021-12-20 – 2021-12-24 (×5): 80 mg via ORAL
  Filled 2021-12-20 (×5): qty 2

## 2021-12-20 MED ORDER — INSULIN ASPART 100 UNIT/ML IJ SOLN
0.0000 [IU] | Freq: Three times a day (TID) | INTRAMUSCULAR | Status: DC
  Administered 2021-12-21 (×2): 3 [IU] via SUBCUTANEOUS
  Administered 2021-12-21: 9 [IU] via SUBCUTANEOUS
  Administered 2021-12-22 (×2): 7 [IU] via SUBCUTANEOUS

## 2021-12-20 MED ORDER — INSULIN ASPART 100 UNIT/ML IJ SOLN
0.0000 [IU] | Freq: Every day | INTRAMUSCULAR | Status: DC
  Administered 2021-12-20: 4 [IU] via SUBCUTANEOUS
  Administered 2021-12-21: 5 [IU] via SUBCUTANEOUS

## 2021-12-20 MED ORDER — LIDOCAINE 5 % EX PTCH
1.0000 | MEDICATED_PATCH | CUTANEOUS | Status: DC
  Administered 2021-12-20 – 2021-12-23 (×4): 1 via TRANSDERMAL
  Filled 2021-12-20 (×4): qty 1

## 2021-12-20 MED ORDER — SODIUM CHLORIDE 0.9 % IV SOLN: INTRAVENOUS | Status: DC

## 2021-12-20 MED ORDER — INSULIN ASPART 100 UNIT/ML IJ SOLN
0.0000 [IU] | Freq: Three times a day (TID) | INTRAMUSCULAR | Status: DC
  Administered 2021-12-20: 3 [IU] via SUBCUTANEOUS
  Administered 2021-12-20: 5 [IU] via SUBCUTANEOUS
  Filled 2021-12-20: qty 0.09

## 2021-12-20 MED ORDER — ACETAMINOPHEN 325 MG PO TABS
650.0000 mg | ORAL_TABLET | Freq: Four times a day (QID) | ORAL | Status: DC | PRN
  Administered 2021-12-20: 650 mg via ORAL
  Filled 2021-12-20: qty 2

## 2021-12-20 MED ORDER — PANTOPRAZOLE SODIUM 40 MG PO TBEC
40.0000 mg | DELAYED_RELEASE_TABLET | Freq: Every day | ORAL | Status: DC
  Administered 2021-12-20 – 2021-12-24 (×5): 40 mg via ORAL
  Filled 2021-12-20 (×5): qty 1

## 2021-12-20 MED ORDER — INFLUENZA VAC A&B SA ADJ QUAD 0.5 ML IM PRSY
0.5000 mL | PREFILLED_SYRINGE | INTRAMUSCULAR | Status: AC
  Administered 2021-12-21: 0.5 mL via INTRAMUSCULAR
  Filled 2021-12-20: qty 0.5

## 2021-12-20 MED ORDER — INSULIN ASPART 100 UNIT/ML IJ SOLN: 0.0000 [IU] | Freq: Three times a day (TID) | INTRAMUSCULAR | Status: DC

## 2021-12-20 MED ORDER — POLYETHYLENE GLYCOL 3350 17 G PO PACK: 17.0000 g | PACK | Freq: Every day | ORAL | Status: DC | PRN

## 2021-12-20 NOTE — Evaluation (Signed)
Physical Therapy Evaluation Patient Details Name: Rachel Santos MRN: [id number] DOB: [birth year] Today's Date: 12/20/2021  History of Present Illness  Patient is 81 y.o. female presented to ED after fall at home. CT head showed no acute abnormality.  Right humerus and right shoulder x-ray showed fracture at the surgical neck with impaction.  Hip x-ray on the right showed superior pubic rami fracture. 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   Clinical Impression  Rachel Santos is 81 y.o. female admitted with above HPI and diagnosis. Patient is currently limited by functional impairments below (see PT problem list). Patient lives alone and is mod independent with RW at baseline. Pt no limited by Rt sided pain in Rt shoulder and hip, and restrictions of NWB on Rt UE. Pt required mod assist to pivot and complete bed mobility and Mod+2 for sit<>stand from EOB. Patient will benefit from continued skilled PT interventions to address impairments and progress independence with mobility, recommending ST rehab at [facility]. Acute PT will follow and progress as able.        Recommendations for follow up therapy are one component of a multi-disciplinary discharge planning process, led by the attending physician.  Recommendations may be updated based on patient status, additional functional criteria and insurance authorization.  Follow Up Recommendations Skilled nursing-short term rehab (<3 hours/day) Can patient physically be transported by private vehicle: No    Assistance Recommended at Discharge Frequent or constant Supervision/Assistance  Patient can return home with the following  Two people to help with walking and/or transfers;Two people to help with bathing/dressing/bathroom;Assistance with  cooking/housework;Direct supervision/assist for medications management;Assist for transportation;Help with stairs or ramp for entrance    Equipment Recommendations None recommended by PT  Recommendations for Other Services       Functional Status Assessment Patient has had a recent decline in their functional status and demonstrates the ability to make significant improvements in function in a reasonable and predictable amount of time.     Precautions / Restrictions Precautions Precautions: Fall Precaution Comments: Per Dr. Eulogio Bear, DO ortho consult states not operable fractures (arm - sling and NWB, hips WBAT with walker (if able)). - no order in chart. Required Braces or Orthoses: Sling Restrictions Weight Bearing Restrictions: Yes RUE Weight Bearing: Non weight bearing RLE Weight Bearing: Weight bearing as tolerated Other Position/Activity Restrictions: sling on Rt UE      Mobility  Bed Mobility Overal bed mobility: Needs Assistance Bed Mobility: Supine to Sit, Sit to Supine     Supine to sit: Mod assist, HOB elevated Sit to supine: Max assist, +2 for physical assistance, +2 for safety/equipment, HOB elevated        Transfers Overall transfer level: Needs assistance Equipment used: 2 person hand held assist Transfers: Sit to/from Stand Sit to Stand: Mod assist, +2 physical assistance, +2 safety/equipment                Ambulation/Gait                  Stairs            Wheelchair Mobility    Modified Rankin (Stroke Patients Only)       Balance  Pertinent Vitals/Pain Pain Assessment Pain Assessment: Faces Faces Pain Scale: Hurts whole lot Pain Location: Rt side, Arm/Leg Pain Descriptors / Indicators: Discomfort, Aching, Moaning Pain Intervention(s): Limited activity within patient's tolerance, Monitored during session, Repositioned, Patient requesting pain meds-RN  notified    Home Living Family/patient expects to be discharged to:: Private residence Living Arrangements: Alone Available Help at Discharge: Family;Available 24 hours/day Type of Home: Apartment Home Access: Level entry       Home Layout: One level   Additional Comments: dtr lives in neighboring apt and works here at [employer]. she is able to check in on her every day after work. pt typically independent in apartment and does her own cleaning and cooking. dtr does her food shopping and one daughter does the driving.    Prior Function Prior Level of Function : Independent/Modified Independent             Mobility Comments: ambulated with SPC and no device ADLs Comments: daughter helps with picking up groceries and driving.     Hand Dominance   Dominant Hand: Right    Extremity/Trunk Assessment   Upper Extremity Assessment Upper Extremity Assessment: Defer to OT evaluation;RUE deficits/detail;Generalized weakness RUE: Unable to fully assess due to pain;Unable to fully assess due to immobilization    Lower Extremity Assessment Lower Extremity Assessment: LLE deficits/detail;RLE deficits/detail;Generalized weakness RLE Deficits / Details: 3/5 grossly throughout RLE: Unable to fully assess due to pain LLE Deficits / Details: 4-/5 with hip flexion, knee ext/flex, ankle DF/PF LLE Sensation: WNL LLE Coordination: WNL    Cervical / Trunk Assessment Cervical / Trunk Assessment: Kyphotic  Communication   Communication: HOH  Cognition Arousal/Alertness: Awake/alert Behavior During Therapy: WFL for tasks assessed/performed Overall Cognitive Status: Within Functional Limits for tasks assessed                                          General Comments      Exercises     Assessment/Plan    PT Assessment Patient needs continued PT services  PT Problem List Decreased strength;Decreased range of motion;Decreased activity tolerance;Decreased balance;Decreased  mobility;Decreased knowledge of use of DME;Decreased safety awareness;Decreased knowledge of precautions;Pain       PT Treatment Interventions Gait training;DME instruction;Stair training;Functional mobility training;Therapeutic activities;Therapeutic exercise;Balance training;Patient/family education;Wheelchair mobility training    PT Goals (Current goals can be found in the Care Plan section)  Acute Rehab PT Goals Patient Stated Goal: get well enough to go home PT Goal Formulation: With patient/family Time For Goal Achievement: 01/03/22 Potential to Achieve Goals: Fair    Frequency Min 3X/week     Co-evaluation               AM-PAC PT "6 Clicks" Mobility  Outcome Measure Help needed turning from your back to your side while in a flat bed without using bedrails?: A Lot Help needed moving from lying on your back to sitting on the side of a flat bed without using bedrails?: A Lot Help needed moving to and from a bed to a chair (including a wheelchair)?: Total Help needed standing up from a chair using your arms (e.g., wheelchair or bedside chair)?: Total Help needed to walk in hospital room?: Total Help needed climbing 3-5 steps with a railing? : Total 6 Click Score: 8    End of Session Equipment Utilized During Treatment: Gait belt;Other (comment) (Rt UE sling) Activity Tolerance:  Patient tolerated treatment well;Patient limited by pain Patient left: in bed;with call bell/phone within reach;with nursing/sitter in room;with family/visitor present Nurse Communication: Mobility status PT Visit Diagnosis: Unsteadiness on feet (R26.81);Muscle weakness (generalized) (M62.81);Difficulty in walking, not elsewhere classified (R26.2)    Time: 8889-1694 PT Time Calculation (min) (ACUTE ONLY): 31 min   Charges:   PT Evaluation $PT Eval Moderate Complexity: 1 Mod PT Treatments $Therapeutic Activity: 8-22 mins        Verner Mould, DPT Acute Rehabilitation Services Office  [phone] Pager [phone]  12/20/21 1:59 PM

## 2021-12-20 NOTE — ED Notes (Signed)
Placed on 2 LNC due to oxygen level decreasing 

## 2021-12-20 NOTE — H&P (Signed)
History and Physical   Rachel Frankson Santos LNL:[id number] DOB: 01-27-1941 DOA: 12/19/2021  PCP: Lajean Manes, MD   Patient coming from: Home  Chief Complaint: Fall  HPI: Rachel Santos is a 81 y.o. female with medical history significant of GI bleed, A-fib status post Maze procedure, CVA, diabetes, CKD 3A, status post TAVR, status post tricuspid valve repair, status post mitral valve repair, hypertension, anxiety, depression presenting after fall.  Patient reports that she was walking in her home earlier today and had a mechanical fall where she tripped and then fell landing on her right side.  She states that she typically walks with a cane or walker but was not walking with one at this time.  Denies any preceding symptoms including lightheadedness.  She denies hitting her head and denies any loss of consciousness.  She further denies fevers, chills, chest pain, shortness of breath, abdominal pain, constipation, diarrhea, nausea, vomiting.  ED Course: Vital signs in the ED stable.  Lab work-up included BMP with potassium 5.3, BUN 37, creatinine Mildly elevated but near baseline at 1.14, glucose 375.  Leukocytosis to 18.9 on CBC.  Urinalysis pending.  CT head showed no acute abnormality.  Right humerus and right shoulder x-ray showed fracture at the surgical neck with impaction.  Hip x-ray on the right showed superior pubic rami fracture.  Patient received Tylenol, fentanyl, and 250 cc of IV fluids in the ED.  Some home medications were reordered as well.  Review of Systems: As per HPI otherwise all other systems reviewed and are negative.  Past Medical History:  Diagnosis Date   Acute CVA (cerebrovascular accident) ([place]) 01/24/2019   Anxiety    Arthritis    knees   Atrial fibrillation (HCC)    CAD (coronary artery disease)    a. pre TAVR cath 2020 mild nonobstructive CAD in the LAD and CX with moderate nononstructive disease in the mid-distal RCA and PDA.   Cancer ([place])    left  breast   Cataracts, bilateral    CKD (chronic kidney disease), stage III (HCC)    CVA (cerebral vascular accident) ([place])    Depression    Diabetes mellitus    DVT (deep venous thrombosis) (HCC)    hx of   Dyspnea    with exertion   Full dentures    GERD (gastroesophageal reflux disease)    Groin hematoma    Hearing aid worn    B/L   History of TIA (transient ischemic attack)    mini stroke with vision problems   Hypercholesterolemia    Hypertension    Hypertensive nephropathy    MCI (mild cognitive impairment)    Memory loss    Middle cerebral artery stenosis, left    Mitral regurgitation    Paroxysmal atrial fibrillation (HCC)    Pneumonia    PONV (postoperative nausea and vomiting)    Premature atrial contraction    S/P Maze operation for atrial fibrillation 10/11/2012   Complete bilateral atrial lesion set using bipolar radiofrequency and cryothermy ablation with clipping of LA appendage   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   37m Sorin Memo 3D ring annuloplasty with 26 mm Edwards mc3 tricuspid ring annuloplasty    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   s/p TAVR with a 23 Edwards Sapien 3 Ultra THV via the TF approach   Thyroid nodule    Tricuspid regurgitation    Wears glasses     Past Surgical History:  Procedure Laterality Date  ARTERY REPAIR Right 01/22/2019   Procedure: Right Common Femoral Artery Repair;  Surgeon: Angelia Mould, MD;  Location: [facility];  Service: Vascular;  Laterality: Right;   CARDIAC CATHETERIZATION  09-05-12   CARDIOVERSION N/A 07/31/2012   Procedure: CARDIOVERSION;  Surgeon: Josue Hector, MD;  Location: [place];  Service: Cardiovascular;  Laterality: N/A;   CATARACT EXTRACTION W/ INTRAOCULAR LENS  IMPLANT, BILATERAL     CESAREAN SECTION     x 2   COLONOSCOPY WITH PROPOFOL N/A 11/28/2014   Procedure: COLONOSCOPY WITH PROPOFOL;  Surgeon: Carol Ada, MD;  Location: [facility];  Service: Endoscopy;   Laterality: N/A;   COLONOSCOPY WITH PROPOFOL N/A 01/22/2021   Procedure: COLONOSCOPY WITH PROPOFOL;  Surgeon: Carol Ada, MD;  Location: [facility];  Service: Endoscopy;  Laterality: N/A;   DILATION AND CURETTAGE OF UTERUS     FEMORAL ARTERY EXPLORATION Right 01/22/2019   Procedure: RIGHT GROIN EXPLORATION with evacuation of hematoma;  Surgeon: Angelia Mould, MD;  Location: [place];  Service: Vascular;  Laterality: Right;   FLEXIBLE SIGMOIDOSCOPY N/A 12/18/2015   Procedure: FLEXIBLE SIGMOIDOSCOPY;  Surgeon: Carol Ada, MD;  [facility];  Service: Endoscopy;  Laterality: N/A;   INTRAOPERATIVE TRANSESOPHAGEAL ECHOCARDIOGRAM N/A 10/11/2012   Procedure: INTRAOPERATIVE TRANSESOPHAGEAL ECHOCARDIOGRAM;  Surgeon: Rexene Alberts, MD;  Location: [place];  Service: Open Heart Surgery;  Laterality: N/A;   IR ANGIO INTRA EXTRACRAN SEL COM CAROTID INNOMINATE BILAT MOD SED  05/21/2019   IR ANGIO VERTEBRAL SEL VERTEBRAL BILAT MOD SED  05/21/2019   MASTECTOMY Left 1982   MAZE N/A 10/11/2012   Procedure: MAZE;  Surgeon: Rexene Alberts, MD;  Location: [place];  Service: Open Heart Surgery;  Laterality: N/A;   MITRAL VALVE REPAIR N/A 10/11/2012   Procedure: MITRAL VALVE REPAIR (MVR);  Surgeon: Rexene Alberts, MD;  Location: [facility];  Service: Open Heart Surgery;  Laterality: N/A;   MULTIPLE TOOTH EXTRACTIONS     RADIOLOGY WITH ANESTHESIA N/A 06/19/2019   Procedure: STENTING;  Surgeon: Luanne Bras, MD;  Location: [place];  Service: Radiology;  Laterality: N/A;   RIGHT HEART CATH AND CORONARY ANGIOGRAPHY N/A 12/24/2018   Procedure: RIGHT HEART CATH AND CORONARY ANGIOGRAPHY;  Surgeon: [name], MD;  Location: [facility];  Service: Cardiovascular;  Laterality: N/A;   TEE WITHOUT CARDIOVERSION N/A 07/31/2012   Procedure: TRANSESOPHAGEAL ECHOCARDIOGRAM (TEE);  Surgeon: Josue Hector, MD;  Location: [place];  Service: Cardiovascular;  Laterality: N/A;   TEE WITHOUT  CARDIOVERSION N/A 01/22/2019   Procedure: TRANSESOPHAGEAL ECHOCARDIOGRAM (TEE);  Surgeon: [name], MD;  Location: [place] [facility];  Service: Open Heart Surgery;  Laterality: N/A;   TRANSCATHETER AORTIC VALVE REPLACEMENT, TRANSFEMORAL N/A 01/22/2019   Procedure: TRANSCATHETER AORTIC VALVE REPLACEMENT, TRANSFEMORAL;  Surgeon: [name], MD;  Location: [facility];  Service: Open Heart Surgery;  Laterality: N/A;   TRICUSPID VALVE REPLACEMENT N/A 10/11/2012   Procedure: TRICUSPID VALVE REPAIR;  Surgeon: Rexene Alberts, MD;  Location: [place];  Service: Open Heart Surgery;  Laterality: N/A;   TUBAL LIGATION      Social History  reports that she has an unknown smoking status. She has never used smokeless tobacco. She reports that she does not drink alcohol and does not use drugs.  Allergies  Allergen Reactions   Buspirone Hcl     lethargic/tired   Metformin And Related Diarrhea   Ozempic (0.25 Or 0.5 Mg-Dose) [Semaglutide(0.25 Or 0.'5mg'$ -Dos)]  Other reaction(s): nausea   Ultram [Tramadol] Nausea And Vomiting   Nickel Rash    Pt unable to wear jewelry made of nickel.   Pneumococcal Vaccines Swelling and Rash    At injection set    Family History  Problem Relation Age of Onset   Alcohol abuse Mother    Cancer Father        prostate   Hypertension Father    Heart attack Paternal Grandfather    Stroke Neg Hx   Reviewed on admission  Prior to Admission medications   Medication Sig Start Date End Date Taking? Authorizing Provider  acetaminophen (TYLENOL) 500 MG tablet Take 1,000 mg by mouth every 6 (six) hours as needed for moderate pain or headache.   Yes [provider]  apixaban (ELIQUIS) 2.5 MG TABS tablet Take 1 tablet (2.5 mg total) by mouth 2 (two) times daily. 10/20/21  Yes Mercy Riding, MD  atorvastatin (LIPITOR) 80 MG tablet Take 1 tablet by mouth once daily 10/06/21  Yes [name], MD  Calcium Carb-Cholecalciferol  (CALCIUM 600 + D PO) Take 1 tablet by mouth 2 (two) times daily.   Yes [provider]  dapagliflozin propanediol (FARXIGA) 10 MG TABS tablet Take 10 mg by mouth daily. 03/03/20  Yes [provider]  docusate sodium (COLACE) 100 MG capsule Take 100-200 mg by mouth daily as needed for mild constipation.   Yes [provider]  insulin detemir (LEVEMIR) 100 UNIT/ML injection Inject 0.07 mLs (7 Units total) into the skin 2 (two) times daily. After breakfast and dinner Patient taking differently: Inject 30 Units into the skin in the morning. 10/20/21  Yes Mercy Riding, MD  insulin regular (NOVOLIN R) 100 units/mL injection Inject 15 Units into the skin every evening.   Yes [provider]  Multiple Vitamins-Minerals (MULTIVITAMIN WITH MINERALS) tablet Take 1 tablet by mouth daily.   Yes [provider]  nitroGLYCERIN (NITROSTAT) 0.4 MG SL tablet Place 1 tablet (0.4 mg total) under the tongue every 5 (five) minutes as needed for chest pain. 09/08/20 10/17/22 Yes Weaver, Scott T, PA-C  omeprazole (PRILOSEC) 40 MG capsule Take 40 mg by mouth daily before breakfast.  06/28/12  Yes [provider]  polyethylene glycol powder (MIRALAX) 17 GM/SCOOP powder Take 17 g by mouth 2 (two) times daily as needed for moderate constipation or mild constipation. 10/20/21  Yes Mercy Riding, MD  venlafaxine XR (EFFEXOR-XR) 150 MG 24 hr capsule Take 150 mg by mouth daily with breakfast. Take with 75 mg capsule to equal 225 mg daily   Yes [provider]  venlafaxine XR (EFFEXOR-XR) 75 MG 24 hr capsule Take 75 mg by mouth daily with breakfast. Take with 150 mg capsule to equal 225 mg daily   Yes [provider]  vitamin B-12 (CYANOCOBALAMIN) 500 MCG tablet Take 500 mcg by mouth every Monday.   Yes [provider]  ondansetron (ZOFRAN) 4 MG tablet Take 4 mg by mouth every 8 (eight) hours as needed for nausea or vomiting. 12/14/21   [provider]     Physical Exam: Vitals:   12/19/21 2001 12/19/21 2044  BP: 116/64   Pulse: 81   Resp: 16   Temp: 97.8 F (36.6 C)   TempSrc: Oral   SpO2: 94%   Weight:  66.2 kg  Height:  '5\' 6"'$  (1.676 m)    Physical Exam Constitutional:      General: She is not in acute distress.  Appearance: Normal appearance.  HENT:     Head: Normocephalic and atraumatic.     Mouth/Throat:     Mouth: Mucous membranes are moist.     Pharynx: Oropharynx is clear.  Eyes:     Extraocular Movements: Extraocular movements intact.     Pupils: Pupils are equal, round, and reactive to light.  Cardiovascular:     Rate and Rhythm: Normal rate and regular rhythm.     Pulses: Normal pulses.     Heart sounds: Normal heart sounds.  Pulmonary:     Effort: Pulmonary effort is normal. No respiratory distress.     Breath sounds: Normal breath sounds.  Abdominal:     General: Bowel sounds are normal. There is no distension.     Palpations: Abdomen is soft.     Tenderness: There is no abdominal tenderness.  Musculoskeletal:        General: No swelling or deformity.     Comments: Right shoulder and arm tenderness, shoulder and sling.  Bruising noted.  Right lateral hip tenderness.  Skin:    General: Skin is warm and dry.  Neurological:     General: No focal deficit present.     Mental Status: Mental status is at baseline.    Labs on Admission: I have personally reviewed following labs and imaging studies  CBC: Recent Labs  Lab 12/19/21 2330  WBC 18.9*  HGB 13.6  HCT 41.5  MCV 90.2  PLT 323    Basic Metabolic Panel: Recent Labs  Lab 12/19/21 2330  NA 135  K 5.3*  CL 102  CO2 24  GLUCOSE 375*  BUN 37*  CREATININE 1.14*  CALCIUM 9.3    GFR: Estimated Creatinine Clearance: 35.6 mL/min (A) (by C-G formula based on SCr of 1.14 mg/dL (H)).  Liver Function Tests: No results for input(s): "AST", "ALT", "ALKPHOS", "BILITOT", "PROT", "ALBUMIN" in the last 168 hours.  Urine analysis:     Component Value Date/Time   COLORURINE YELLOW 10/16/2021 2244   APPEARANCEUR HAZY (A) 10/16/2021 2244   LABSPEC 1.025 10/16/2021 2244   PHURINE 8.0 10/16/2021 2244   GLUCOSEU >=500 (A) 10/16/2021 2244   HGBUR MODERATE (A) 10/16/2021 2244   BILIRUBINUR NEGATIVE 10/16/2021 2244   KETONESUR NEGATIVE 10/16/2021 2244   PROTEINUR NEGATIVE 10/16/2021 2244   UROBILINOGEN 1.0 10/09/2012 1630   NITRITE NEGATIVE 10/16/2021 2244   LEUKOCYTESUR LARGE (A) 10/16/2021 2244    Radiological Exams on Admission: DG Hip Unilat With Pelvis 2-3 Views Right  Result Date: 12/19/2021 CLINICAL DATA:  Fall, right hip pain EXAM: DG HIP (WITH OR WITHOUT PELVIS) 2-3V RIGHT COMPARISON:  None FINDINGS: Fracture through the right superior pubic ramus. No proximal femoral fracture. No subluxation or dislocation. SI joints symmetric and unremarkable. IMPRESSION: Fracture through the right superior pubic ramus. Electronically Signed   By: Rolm Baptise M.D.   On: 12/19/2021 22:34   DG Humerus Right  Result Date: 12/19/2021 CLINICAL DATA:  Right shoulder injury.  Fell. EXAM: RIGHT HUMERUS - 2+ VIEW; RIGHT SHOULDER - 2+ VIEW COMPARISON:  None Available. FINDINGS: Mild osteopenia. There is acute impacted transverse surgical neck fracture of the proximal humerus with comminution between fracture margins and along the greater tuberosity. The distal fragment is translated anteriorly about 1/3 of a shaft width, with mild posterolateral distal-fragment angulation as well. No dislocation is seen. The [facility] joint is intact with circumferential spurring. Median sternotomy sutures are partially visible. The visualized right lung fields are clear. There is a chronic healed fracture  deformity of the posterolateral right seventh rib. IMPRESSION: 1. Acute transverse surgical neck proximal right humeral fracture with impaction, comminution between fracture margins and along the greater tuberosity, and mild anterior translation of the distal fragment  as well as mild posterolateral angulation. 2. Osteopenia and degenerative change. Electronically Signed   By: Telford Nab M.D.   On: 12/19/2021 20:54   DG Shoulder Right  Result Date: 12/19/2021 CLINICAL DATA:  Right shoulder injury.  Fell. EXAM: RIGHT HUMERUS - 2+ VIEW; RIGHT SHOULDER - 2+ VIEW COMPARISON:  None Available. FINDINGS: Mild osteopenia. There is acute impacted transverse surgical neck fracture of the proximal humerus with comminution between fracture margins and along the greater tuberosity. The distal fragment is translated anteriorly about 1/3 of a shaft width, with mild posterolateral distal-fragment angulation as well. No dislocation is seen. The [facility] joint is intact with circumferential spurring. Median sternotomy sutures are partially visible. The visualized right lung fields are clear. There is a chronic healed fracture deformity of the posterolateral right seventh rib. IMPRESSION: 1. Acute transverse surgical neck proximal right humeral fracture with impaction, comminution between fracture margins and along the greater tuberosity, and mild anterior translation of the distal fragment as well as mild posterolateral angulation. 2. Osteopenia and degenerative change. Electronically Signed   By: Telford Nab M.D.   On: 12/19/2021 20:54   CT Head Wo Contrast  Result Date: 12/19/2021 CLINICAL DATA:  Patient fell but is unsure of when EXAM: CT HEAD WITHOUT CONTRAST TECHNIQUE: Contiguous axial images were obtained from the base of the skull through the vertex without intravenous contrast. RADIATION DOSE REDUCTION: This exam was performed according to the departmental dose-optimization program which includes automated exposure control, adjustment of the mA and/or kV according to patient size and/or use of iterative reconstruction technique. COMPARISON:  CT 01/13/2021 FINDINGS: Brain: No intracranial hemorrhage, mass effect, or evidence of acute infarct. No hydrocephalus. No extra-axial fluid  collection. Generalized cerebral atrophy. Ill-defined hypoattenuation within the cerebral white matter is nonspecific but consistent with chronic small vessel ischemic disease. Chronic left cerebellar infarct. Vascular: No hyperdense vessel. Intracranial arterial calcification. Skull: No fracture or focal lesion. Sinuses/Orbits: No acute finding. Trace air-fluid level in the right maxillary sinus with some frothy secretions anteriorly. Small air-fluid level in the left maxillary sinus. Other: None. IMPRESSION: No acute intracranial abnormality. Age-related atrophy and chronic microvascular ischemic change. Air-fluid levels in the maxillary sinuses suggestive acute sinusitis. Electronically Signed   By: Placido Sou M.D.   On: 12/19/2021 20:39    EKG: Independently reviewed.  Sinus rhythm at 76 bpm.  Nonspecific T wave flattening.  Some baseline wander.  Assessment/Plan Principal Problem:   Fall Active Problems:   PAF (paroxysmal atrial fibrillation) (HCC)   History of CVA (cerebrovascular accident)   Type 2 diabetes mellitus with complication, with long-term current use of insulin (HCC)   Chronic kidney disease, stage 3a ([place])   S/P mitral valve repair   S/P Maze operation for atrial fibrillation   S/P TVR (tricuspid valve repair)   HTN (hypertension)   S/P TAVR (transcatheter aortic valve replacement)   Depression with anxiety   Humerus fracture   Fall Right humerus fracture Right pubic rami fracture > Patient presenting after a mechanical fall at home.  She was not using her cane or walker which she typically uses to walk while walking in her home and then tripped and fell. > Not hit her head nor lose consciousness, CT head in the ED without acute normality.  Right  shoulder and humerus x-ray did show fracture at the surgical neck of the right humerus with impaction.  Right hip x-ray showed superior rami fracture. > Patient with significant pain in the ED and difficulty with mobility at  baseline.  She lives alone.  Patient to be admitted for observation for pain control and PT and OT evaluation. - Monitor on telemetry given hyperkalemia as below - As needed Tylenol for mild pain, as needed Norco for moderate to severe pain, as needed Dilaudid for breakthrough severe pain - PT and OT eval and treat - Consult placed in the ED for transition of care - Consider input from orthopedics while admitted  Hyperkalemia CKD 3A > Patient noted to have potassium of 5.3.  Borderline creatinine elevation at 1.14 from baseline between 0.70.9.  Appears to be euvolemic. > Received 250 cc of IV fluids in the ED will continue overnight. - Continue with IV fluids - Trend renal function and electrolytes  Atrial fibrillation > Status post maze - Continue home Eliquis  Diabetes > Continue home Levemir 7 units twice daily (though she told pharmacy that she has changed how she takes this will continue at reduced dose) - SSI  Hypertension - Not currently on any antihypertensives  Hyperlipidemia - Continue home atorvastatin  History of CVA - Continue home atorvastatin - Is also on Eliquis as above  Anxiety Depression - Continue home Effexor  Status post TAVR Status post tricuspid valve repair Status post mitral valve repair - Noted  DVT prophylaxis: Eliquis Code Status:   Full Family Communication:  None on admission.  She states her family is aware of her admission and just left the ED to go a little while ago. Disposition Plan:   Patient is from:  Home  Anticipated DC to:  Pending clinical course  Anticipated DC date:  1 to 3 days  Anticipated DC barriers: Possible need for placement  Consults called:  None Admission status:  Observation, telemetry  Severity of Illness: The appropriate patient status for this patient is OBSERVATION. Observation status is judged to be reasonable and necessary in order to provide the required intensity of service to ensure the patient's  safety. The patient's presenting symptoms, physical exam findings, and initial radiographic and laboratory data in the context of their medical condition is felt to place them at decreased risk for further clinical deterioration. Furthermore, it is anticipated that the patient will be medically stable for discharge from the hospital within 2 midnights of admission.    Marcelyn Bruins MD [facility]  How to contact the [facility] Attending or Consulting provider [place] or covering provider during after hours [place], for this patient?   Check the care team in [facility] and look for a) attending/consulting [facility] provider listed and b) the [facility] team listed Log into [email or web address] and use [place] universal password to access. If you do not have the password, please contact the hospital operator. Locate the [facility] provider you are looking for under [facility] and page to a number that you can be directly reached. If you still have difficulty reaching the provider, please page the [facility] (Director on Call) for the Hospitalists listed on amion for assistance.  12/20/2021, 12:27 AM

## 2021-12-20 NOTE — ED Notes (Signed)
Hospitalist at bedside 

## 2021-12-20 NOTE — Progress Notes (Signed)
Patient admitted after midnight, please see H&P.  Here with a fall and subsequent non-operable fractures.  PT/OT consulted and suspect patient will need SNF as she lives alone. Eulogio Bear DO

## 2021-12-21 DIAGNOSIS — S32591A Other specified fracture of right pubis, initial encounter for closed fracture: Secondary | ICD-10-CM | POA: Diagnosis not present

## 2021-12-21 DIAGNOSIS — W19XXXA Unspecified fall, initial encounter: Secondary | ICD-10-CM | POA: Diagnosis not present

## 2021-12-21 DIAGNOSIS — Z7984 Long term (current) use of oral hypoglycemic drugs: Secondary | ICD-10-CM | POA: Diagnosis not present

## 2021-12-21 DIAGNOSIS — E1122 Type 2 diabetes mellitus with diabetic chronic kidney disease: Secondary | ICD-10-CM | POA: Diagnosis not present

## 2021-12-21 DIAGNOSIS — I480 Paroxysmal atrial fibrillation: Secondary | ICD-10-CM | POA: Diagnosis not present

## 2021-12-21 DIAGNOSIS — Z954 Presence of other heart-valve replacement: Secondary | ICD-10-CM | POA: Diagnosis not present

## 2021-12-21 DIAGNOSIS — E1165 Type 2 diabetes mellitus with hyperglycemia: Secondary | ICD-10-CM | POA: Diagnosis not present

## 2021-12-21 DIAGNOSIS — E875 Hyperkalemia: Secondary | ICD-10-CM | POA: Diagnosis not present

## 2021-12-21 DIAGNOSIS — R2681 Unsteadiness on feet: Secondary | ICD-10-CM | POA: Diagnosis not present

## 2021-12-21 DIAGNOSIS — Z23 Encounter for immunization: Secondary | ICD-10-CM | POA: Diagnosis not present

## 2021-12-21 DIAGNOSIS — S32501A Unspecified fracture of right pubis, initial encounter for closed fracture: Secondary | ICD-10-CM | POA: Diagnosis not present

## 2021-12-21 DIAGNOSIS — Z8673 Personal history of transient ischemic attack (TIA), and cerebral infarction without residual deficits: Secondary | ICD-10-CM | POA: Diagnosis not present

## 2021-12-21 DIAGNOSIS — M6281 Muscle weakness (generalized): Secondary | ICD-10-CM | POA: Diagnosis not present

## 2021-12-21 DIAGNOSIS — N1831 Chronic kidney disease, stage 3a: Secondary | ICD-10-CM | POA: Diagnosis not present

## 2021-12-21 DIAGNOSIS — Z86718 Personal history of other venous thrombosis and embolism: Secondary | ICD-10-CM | POA: Diagnosis not present

## 2021-12-21 DIAGNOSIS — G3184 Mild cognitive impairment, so stated: Secondary | ICD-10-CM | POA: Diagnosis not present

## 2021-12-21 DIAGNOSIS — Z79899 Other long term (current) drug therapy: Secondary | ICD-10-CM | POA: Diagnosis not present

## 2021-12-21 DIAGNOSIS — I2510 Atherosclerotic heart disease of native coronary artery without angina pectoris: Secondary | ICD-10-CM | POA: Diagnosis not present

## 2021-12-21 DIAGNOSIS — Z794 Long term (current) use of insulin: Secondary | ICD-10-CM | POA: Diagnosis not present

## 2021-12-21 DIAGNOSIS — I129 Hypertensive chronic kidney disease with stage 1 through stage 4 chronic kidney disease, or unspecified chronic kidney disease: Secondary | ICD-10-CM | POA: Diagnosis not present

## 2021-12-21 DIAGNOSIS — Z7901 Long term (current) use of anticoagulants: Secondary | ICD-10-CM | POA: Diagnosis not present

## 2021-12-21 DIAGNOSIS — S0083XA Contusion of other part of head, initial encounter: Secondary | ICD-10-CM | POA: Diagnosis not present

## 2021-12-21 DIAGNOSIS — S42211A Unspecified displaced fracture of surgical neck of right humerus, initial encounter for closed fracture: Secondary | ICD-10-CM | POA: Diagnosis not present

## 2021-12-21 DIAGNOSIS — Z853 Personal history of malignant neoplasm of breast: Secondary | ICD-10-CM | POA: Diagnosis not present

## 2021-12-21 LAB — CBC
HCT: 30.5 % — ABNORMAL LOW (ref 36.0–46.0)
Hemoglobin: 10 g/dL — ABNORMAL LOW (ref 12.0–15.0)
MCH: 29.7 pg (ref 26.0–34.0)
MCHC: 32.8 g/dL (ref 30.0–36.0)
MCV: 90.5 fL (ref 80.0–100.0)
Platelets: 143 10*3/uL — ABNORMAL LOW (ref 150–400)
RBC: 3.37 MIL/uL — ABNORMAL LOW (ref 3.87–5.11)
RDW: 13 % (ref 11.5–15.5)
WBC: 11.6 10*3/uL — ABNORMAL HIGH (ref 4.0–10.5)
nRBC: 0 % (ref 0.0–0.2)

## 2021-12-21 LAB — GLUCOSE, CAPILLARY
Glucose-Capillary: 242 mg/dL — ABNORMAL HIGH (ref 70–99)
Glucose-Capillary: 355 mg/dL — ABNORMAL HIGH (ref 70–99)
Glucose-Capillary: 203 mg/dL — ABNORMAL HIGH (ref 70–99)
Glucose-Capillary: 372 mg/dL — ABNORMAL HIGH (ref 70–99)

## 2021-12-21 LAB — BASIC METABOLIC PANEL
Anion gap: 6 (ref 5–15)
BUN: 40 mg/dL — ABNORMAL HIGH (ref 8–23)
CO2: 25 mmol/L (ref 22–32)
Calcium: 8.4 mg/dL — ABNORMAL LOW (ref 8.9–10.3)
Chloride: 102 mmol/L (ref 98–111)
Creatinine, Ser: 1.02 mg/dL — ABNORMAL HIGH (ref 0.44–1.00)
GFR, Estimated: 55 mL/min — ABNORMAL LOW (ref >60)
Glucose, Bld: 257 mg/dL — ABNORMAL HIGH (ref 70–99)
Potassium: 4.5 mmol/L (ref 3.5–5.1)
Sodium: 133 mmol/L — ABNORMAL LOW (ref 135–145)

## 2021-12-21 MED ORDER — INSULIN DETEMIR 100 UNIT/ML ~~LOC~~ SOLN
12.0000 [IU] | Freq: Two times a day (BID) | SUBCUTANEOUS | Status: DC
  Administered 2021-12-21: 12 [IU] via SUBCUTANEOUS
  Filled 2021-12-21 (×2): qty 0.12

## 2021-12-21 MED ORDER — ACETAMINOPHEN 500 MG PO TABS
1000.0000 mg | ORAL_TABLET | Freq: Three times a day (TID) | ORAL | Status: DC
  Administered 2021-12-21 – 2021-12-24 (×10): 1000 mg via ORAL
  Filled 2021-12-21 (×10): qty 2

## 2021-12-21 NOTE — TOC Initial Note (Signed)
Transition of Care [facility]) - Initial/Assessment Note    Patient Details  Name: Rachel Santos MRN: [id number] Date of Birth: [birth year]  Transition of Care [facility]) CM/SW Contact:    Vassie Moselle, LCSW Phone Number: 12/21/2021, 9:31 AM  Clinical Narrative:                 Met with pt and family at bedside and confirmed plan for SNF placement. Family shares that pt has not been to SNF before and they are unfamiliar with SNF facilities. They are agreeable to pt being referred out and prefer facilities that are around or between [facility] and [facility] as this will be close to where pt's children live. Pt has been referred out for SNF placement and currently awaiting bed offers.    Expected Discharge Plan: Skilled Nursing Facility Barriers to Discharge: No Barriers Identified   Patient Goals and CMS Choice Patient states their goals for this hospitalization and ongoing recovery are:: To return home CMS Medicare.gov Compare Post Acute Care list provided to:: Patient Choice offered to / list presented to : Patient, Adult Children  Expected Discharge Plan and Services Expected Discharge Plan: [place] In-house Referral: NA Discharge Planning Services: NA Post Acute Care Choice: [place] Living arrangements for the past 2 months: Apartment                 DME Arranged: N/A DME Agency: NA                  Prior Living Arrangements/Services Living arrangements for the past 2 months: Apartment Lives with:: Self Patient language and need for interpreter reviewed:: Yes Do you feel safe going back to the place where you live?: Yes      Need for Family Participation in Patient Care: No (Comment) Care giver support system in place?: No (comment) Current home services: DME Criminal Activity/Legal Involvement Pertinent to Current Situation/Hospitalization: No - Comment as needed  Activities of Daily Living Home Assistive Devices/Equipment:  Cane (specify quad or straight), Walker (specify type) (straight cane, FWW) ADL Screening (condition at time of admission) Patient's cognitive ability adequate to safely complete daily activities?: Yes Is the patient deaf or have difficulty hearing?: No Does the patient have difficulty seeing, even when wearing glasses/contacts?: No Does the patient have difficulty concentrating, remembering, or making decisions?: No Patient able to express need for assistance with ADLs?: Yes Does the patient have difficulty dressing or bathing?: Yes Independently performs ADLs?: No Communication: Independent Dressing (OT): Needs assistance Is this a change from baseline?: Change from baseline, expected to last >3 days Grooming: Needs assistance Is this a change from baseline?: Change from baseline, expected to last >3 days Feeding: Independent Bathing: Needs assistance Is this a change from baseline?: Change from baseline, expected to last >3 days Toileting: Needs assistance Is this a change from baseline?: Change from baseline, expected to last >3days In/Out Bed: Needs assistance Is this a change from baseline?: Change from baseline, expected to last >3 days Walks in Home: Needs assistance Is this a change from baseline?: Change from baseline, expected to last >3 days Does the patient have difficulty walking or climbing stairs?: Yes Weakness of Legs: Left Weakness of Arms/Hands: Right  Permission Sought/Granted Permission sought to share information with : Facility Sport and exercise psychologist, Family Supports Permission granted to share information with : Yes, Verbal Permission Granted  Share Information with NAME: Renee Ramus     Permission granted to share info w Relationship: Daughter  Permission granted to share info w Contact Information: [phone]  Emotional Assessment Appearance:: Appears stated age Attitude/Demeanor/Rapport: Engaged Affect (typically observed): Accepting Orientation:  : Oriented to Self, Oriented to Place, Oriented to  Time, Oriented to Situation Alcohol / Substance Use: Not Applicable Psych Involvement: No (comment)  Admission diagnosis:  Fall [W19.XXXA] Contusion of face, initial encounter [S00.83XA] Fall, initial encounter [W19.XXXA] Closed fracture of ramus of right pubis, initial encounter ([place]) [S32.591A] Closed displaced fracture of surgical neck of right humerus, unspecified fracture morphology, initial encounter [E39.532Y] Patient Active Problem List   Diagnosis Date Noted   Fall 12/20/2021   Humerus fracture 12/20/2021   Rectal bleeding    Simple adnexal cyst greater than 5 cm in diameter in premenopausal patient 10/17/2021   Orthostatic hypotension 10/17/2021   Pyuria 10/17/2021   Microscopic hematuria 10/17/2021   Acute GI bleeding 10/16/2021   History of CVA (cerebrovascular accident) 10/16/2021   Chronic kidney disease, stage 3a ([place]) 10/16/2021   Depression with anxiety 10/16/2021   Abnormal finding on imaging 10/16/2021   Cerebral artery occlusion 06/27/2019   Acute blood loss anemia, transfused 2 units PRBC total this admit 01/27/2019   Groin hematoma with urgent evacuation 01/23/19 01/23/2019   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   Type 2 diabetes mellitus with complication, with long-term current use of insulin (HCC)    Severe aortic stenosis    HTN (hypertension) 08/13/2013   Insomnia 08/13/2013   S/P TVR (tricuspid valve repair) 05/27/2013   Long term (current) use of anticoagulants 10/25/2012   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   S/P Maze operation for atrial fibrillation 10/11/2012   PAF (paroxysmal atrial fibrillation) ([place]) 07/03/2012   PCP:  Lajean Manes, MD Pharmacy:   [place], [place] [address] Phone: [phone] Fax: [place] [address] Phone: [phone] Fax: [phone]     Social Determinants of Health (SDOH) Interventions    Readmission Risk Interventions    10/19/2021   12:59 PM  Readmission Risk Prevention Plan  Post Dischage Appt Complete  Medication Screening Complete  Transportation Screening Complete

## 2021-12-21 NOTE — NC FL2 (Signed)
[place] LEVEL OF CARE SCREENING TOOL     IDENTIFICATION  Patient Name: Rachel Santos Birthdate: 01-25-1941 Sex: female Admission Date (Current Location): 12/19/2021  [facility] and [place] Number:  Herbalist and Address:  [facility],  [address] [place], [place]      Provider Number: [id number]  Attending Physician Name and Address:  Geradine Girt, DO  Relative Name and Phone Number:  Daughter, Renee Ramus [phone]    Current Level of Care: Hospital Recommended Level of Care: [place] Prior Approval Number:    Date Approved/Denied:   PASRR Number: [id number]  Discharge Plan: SNF    Current Diagnoses: Patient Active Problem List   Diagnosis Date Noted   Fall 12/20/2021   Humerus fracture 12/20/2021   Rectal bleeding    Simple adnexal cyst greater than 5 cm in diameter in premenopausal patient 10/17/2021   Orthostatic hypotension 10/17/2021   Pyuria 10/17/2021   Microscopic hematuria 10/17/2021   Acute GI bleeding 10/16/2021   History of CVA (cerebrovascular accident) 10/16/2021   Chronic kidney disease, stage 3a ([place]) 10/16/2021   Depression with anxiety 10/16/2021   Abnormal finding on imaging 10/16/2021   Cerebral artery occlusion 06/27/2019   Acute blood loss anemia, transfused 2 units PRBC total this admit 01/27/2019   Groin hematoma with urgent evacuation 01/23/19 01/23/2019   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   Type 2 diabetes mellitus with complication, with long-term current use of insulin (HCC)    Severe aortic stenosis    HTN (hypertension) 08/13/2013   Insomnia 08/13/2013   S/P TVR (tricuspid valve repair) 05/27/2013   Long term (current) use of anticoagulants 10/25/2012   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   S/P Maze operation for atrial fibrillation 10/11/2012   PAF (paroxysmal atrial fibrillation) ([place]) 07/03/2012    Orientation RESPIRATION  BLADDER Height & Weight     Self, Time, Situation, Place  O2 Continent Weight: 146 lb (66.2 kg) Height:  '5\' 6"'$  (167.6 cm)  BEHAVIORAL SYMPTOMS/MOOD NEUROLOGICAL BOWEL NUTRITION STATUS      Continent Diet (Regular)  AMBULATORY STATUS COMMUNICATION OF NEEDS Skin   Limited Assist Verbally Normal                       Personal Care Assistance Level of Assistance  Bathing, Feeding, Dressing Bathing Assistance: Limited assistance Feeding assistance: Independent Dressing Assistance: Limited assistance     Functional Limitations Info  Sight, Hearing, Speech Sight Info: Adequate Hearing Info: Adequate Speech Info: Adequate    SPECIAL CARE FACTORS FREQUENCY  PT (By licensed PT), OT (By licensed OT)     PT Frequency: 5x/wk OT Frequency: 5x/wk            Contractures Contractures Info: Not present    Additional Factors Info  Psychotropic Code Status Info: FULL Allergies Info: Buspirone Hcl, Metformin And Related, Ozempic (0.25 Or 0.5 Mg-dose) (Semaglutide(0.25 Or 0.'5mg'$ -dos)), Ultram (Tramadol), Nickel, Pneumococcal Vaccines Psychotropic Info: See MAR         Current Medications (12/21/2021):  This is the current hospital active medication list Current Facility-Administered Medications  Medication Dose Route Frequency Provider Last Rate Last Admin   acetaminophen (TYLENOL) tablet 1,000 mg  1,000 mg Oral TID Vann, Jessica U, DO       apixaban Arne [place]) tablet 2.5 mg  2.5 mg Oral BID Marcelyn Bruins, MD   2.5 mg at 12/20/21 2207   atorvastatin (LIPITOR) tablet 80  mg  80 mg Oral Daily Marcelyn Bruins, MD   80 mg at 12/20/21 4680   HYDROcodone-acetaminophen (NORCO/VICODIN) 5-325 MG per tablet 1 tablet  1 tablet Oral Q4H PRN Marcelyn Bruins, MD   1 tablet at 12/21/21 0522   HYDROmorphone (DILAUDID) injection 0.5 mg  0.5 mg Intravenous Q3H PRN Marcelyn Bruins, MD   0.5 mg at 12/20/21 1149   influenza vaccine adjuvanted (FLUAD) injection 0.5 mL  0.5 mL  Intramuscular Tomorrow-1000 Vann, Jessica U, DO       insulin aspart (novoLOG) injection 0-5 Units  0-5 Units Subcutaneous QHS Eulogio Bear U, DO   4 Units at 12/20/21 2208   insulin aspart (novoLOG) injection 0-9 Units  0-9 Units Subcutaneous TID WC Vann, Jessica U, DO       insulin detemir (LEVEMIR) injection 7 Units  7 Units Subcutaneous BID Marcelyn Bruins, MD   7 Units at 12/20/21 2208   lidocaine (LIDODERM) 5 % 1 patch  1 patch Transdermal Q24H Eulogio Bear U, DO   1 patch at 12/20/21 1150   pantoprazole (PROTONIX) EC tablet 40 mg  40 mg Oral Daily Marcelyn Bruins, MD   40 mg at 12/20/21 3212   polyethylene glycol (MIRALAX / GLYCOLAX) packet 17 g  17 g Oral Daily PRN Marcelyn Bruins, MD       sodium chloride flush (NS) 0.9 % injection 3 mL  3 mL Intravenous Q12H Marcelyn Bruins, MD   3 mL at 12/21/21 0524   venlafaxine XR (EFFEXOR-XR) 24 hr capsule 150 mg  150 mg Oral Q breakfast Marcelyn Bruins, MD   150 mg at 12/20/21 0802   venlafaxine XR (EFFEXOR-XR) 24 hr capsule 75 mg  75 mg Oral Q breakfast Marcelyn Bruins, MD   75 mg at 12/20/21 0802     Discharge Medications: Please see discharge summary for a list of discharge medications.  Relevant Imaging Results:  Relevant Lab Results:   Additional Information SSN: [id number]  Vassie Moselle, LCSW

## 2021-12-21 NOTE — Evaluation (Signed)
Occupational Therapy Evaluation Patient Details Name: Rachel Santos MRN: [id number] DOB: 01/19/1941 Today's Date: 12/21/2021   History of Present Illness Patient is 81 y.o. female presented to ED after fall at home. CT head showed no acute abnormality.  Right humerus and right shoulder x-ray showed fracture at the surgical neck with impaction.  Hip x-ray on the right showed superior pubic rami fracture. 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   Clinical Impression   Patient is a 81 year old female who was admitted for above. Patient reported living at home alone but remained very confused during session repeating same question every two minutes about why she was having pain and what happened. Patient was pleasant and cooperative during session even with pain. Patient was noted to have decreased functional activity tolerance, decreased endurance, decreased standing balance, decreased safety awareness, and decreased knowledge of AD/AE impacting participation in ADLs. Patient would continue to benefit from skilled OT services at this time while admitted and after d/c to address noted deficits in order to improve overall safety and independence in ADLs.        Recommendations for follow up therapy are one component of a multi-disciplinary discharge planning process, led by the attending physician.  Recommendations may be updated based on patient status, additional functional criteria and insurance authorization.   Follow Up Recommendations  Skilled nursing-short term rehab (<3 hours/day)    Assistance Recommended at Discharge Frequent or constant Supervision/Assistance  Patient can return home with the following Two people to help with walking and/or transfers;A lot of help with  bathing/dressing/bathroom;Assistance with cooking/housework;Direct supervision/assist for financial management;Assist for transportation;Help with stairs or ramp for entrance;Direct supervision/assist for medications management;Assistance with feeding    Functional Status Assessment  Patient has had a recent decline in their functional status and demonstrates the ability to make significant improvements in function in a reasonable and predictable amount of time.  Equipment Recommendations  Other (comment) (defer to next venue)    Recommendations for Other Services       Precautions / Restrictions Restrictions Weight Bearing Restrictions: Yes RUE Weight Bearing: Non weight bearing RLE Weight Bearing: Weight bearing as tolerated      Mobility Bed Mobility Overal bed mobility: Needs Assistance Bed Mobility: Supine to Sit, Sit to Supine     Supine to sit: Mod assist, HOB elevated Sit to supine: Max assist   General bed mobility comments: with cues for redirection to tasks consistently through session.    Transfers                          Balance Overall balance assessment: Needs assistance Sitting-balance support: Single extremity supported, Feet supported Sitting balance-Leahy Scale: Fair       Standing balance-Leahy Scale: Zero Standing balance comment: unable to transition into standing                           ADL either performed or assessed with clinical judgement   ADL Overall ADL's : Needs assistance/impaired Eating/Feeding: Minimal assistance Eating/Feeding Details (indicate cue type and reason): in chair in bed Grooming: Minimal assistance;Bed level   Upper Body Bathing: Maximal assistance;Bed level   Lower Body Bathing: Bed level;Maximal assistance   Upper Body Dressing : Bed  level;Maximal assistance   Lower Body Dressing: Maximal assistance;Bed level   Toilet Transfer: +2 for physical assistance;+2 for safety/equipment Toilet  Transfer Details (indicate cue type and reason): patient attempted standing with patietn unable to clear bottom off bed with heaving leaning to L side.                 Vision   Vision Assessment?: No apparent visual deficits     Perception     Praxis      Pertinent Vitals/Pain       Hand Dominance     Extremity/Trunk Assessment Upper Extremity Assessment Upper Extremity Assessment: RUE deficits/detail RUE Deficits / Details: sling in place humeral fx. noted to have R breast sit in sling even with repositioning x2   Lower Extremity Assessment Lower Extremity Assessment: Defer to PT evaluation   Cervical / Trunk Assessment Cervical / Trunk Assessment: Kyphotic   Communication     Cognition Arousal/Alertness: Awake/alert Behavior During Therapy: WFL for tasks assessed/performed                                   General Comments: extremely confused. repeated questions every two minutes with no recollection that they had been asked prior. no family present in room     General Comments       Exercises     Shoulder Instructions      Home Living                                          Prior Functioning/Environment                          OT Problem List: Decreased activity tolerance;Cardiopulmonary status limiting activity;Decreased knowledge of precautions;Decreased knowledge of use of DME or AE;Impaired UE functional use;Pain      OT Treatment/Interventions: Self-care/ADL training;Therapeutic exercise;Neuromuscular education;Energy conservation;DME and/or AE instruction;Therapeutic activities;Balance training;Patient/family education    OT Goals(Current goals can be found in the care plan section) Acute Rehab OT Goals Patient Stated Goal: to get better OT Goal Formulation: Patient unable to participate in goal setting Time For Goal Achievement: 01/04/22 Potential to Achieve Goals: Fair  OT Frequency: Min  2X/week    Co-evaluation              AM-PAC OT "6 Clicks" Daily Activity     Outcome Measure Help from another person eating meals?: A Little Help from another person taking care of personal grooming?: A Little Help from another person toileting, which includes using toliet, bedpan, or urinal?: Total Help from another person bathing (including washing, rinsing, drying)?: Total Help from another person to put on and taking off regular upper body clothing?: Total Help from another person to put on and taking off regular lower body clothing?: Total 6 Click Score: 10   End of Session Equipment Utilized During Treatment: Other (comment) (sling) Nurse Communication: Other (comment);Patient requests pain meds  Activity Tolerance: Patient tolerated treatment well Patient left: in bed;with call bell/phone within reach;with bed alarm set  OT Visit Diagnosis: Unsteadiness on feet (R26.81);Repeated falls (R29.6);Muscle weakness (generalized) (M62.81)                Time: 7106-2694 OT Time Calculation (min): 22 min Charges:  OT General Charges $OT Visit: 1 Visit OT  Evaluation $OT Eval Moderate Complexity: 1 Mod  Athen Riel OTR/L, MS Acute Rehabilitation Department Office# [phone]   Marcellina Millin 12/21/2021, 1:04 PM

## 2021-12-21 NOTE — TOC Progression Note (Signed)
Transition of Care [facility]) - Progression Note    Patient Details  Name: Rachel Santos MRN: [id number] Date of Birth: [birth year]  Transition of Care [facility] CM/SW [place], [place] Phone Number: 12/21/2021, 3:57 PM  Clinical Narrative:    CSW spoke with pt's daughter, Gennaro Africa and reviewed bed offers. CSW answered all questions and emailed list of facilities to be reviewed. Pt's family plans to visit facilities prior to making decision on placement.    Expected Discharge Plan: Skilled Nursing Facility Barriers to Discharge: No Barriers Identified  Expected Discharge Plan and Services Expected Discharge Plan: [place] In-house Referral: NA Discharge Planning Services: NA Post Acute Care Choice: [place] Living arrangements for the past 2 months: Apartment                 DME Arranged: N/A DME Agency: NA                   Social Determinants of Health (SDOH) Interventions    Readmission Risk Interventions    10/19/2021   12:59 PM  Readmission Risk Prevention Plan  Post Dischage Appt Complete  Medication Screening Complete  Transportation Screening Complete

## 2021-12-21 NOTE — Progress Notes (Signed)
PROGRESS NOTE    Rachel Santos  QQI:[id number] DOB: 1940-09-01 DOA: 12/19/2021 PCP: Lajean Manes, MD    Brief Narrative:  Rachel Santos is a 81 y.o. female with medical history significant of GI bleed, A-fib status post Maze procedure, CVA, diabetes, CKD 3A, status post TAVR, status post tricuspid valve repair, status post mitral valve repair, hypertension, anxiety, depression presenting after fall. Needs SNF placement per PT/OT as now has 2 fractures and lives alone    Assessment and Plan: Fall Right humerus fracture Right pubic rami fracture > Patient presenting after a mechanical fall at home.  She was not using her cane or walker which she typically uses to walk while walking in her home and then tripped and fell.  Right shoulder and humerus x-ray did show fracture at the surgical neck of the right humerus with impaction.  Right hip x-ray showed superior rami fracture. 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 - Noted   Memory issues -seems chronic per discussion with family -outpatient follow up  DVT prophylaxis: apixaban (ELIQUIS) tablet 2.5 mg Start: 12/20/21 1000 apixaban (ELIQUIS) tablet 2.5 mg    Code Status: Full Code Family Communication: called a daughter  Disposition Plan:  Level of care: Telemetry Status is: Observation The patient will require care spanning > 2 midnights and should be moved to  inpatient because: needs SNF    Consultants:  Ortho (phone)   Subjective: Hurting still , does not remember fall  Objective: Vitals:   12/20/21 1342 12/20/21 1757 12/20/21 2057 12/21/21 0124  BP: (!) 142/60 111/75 (!) 137/48 131/61  Pulse: 73 70 74 76  Resp: 18 18 (!) 21 20  Temp: 98.4 F (36.9 C) 98.5 F (36.9 C) 98.7 F (37.1 C) 100.3 F (37.9 C)  TempSrc: Oral Oral    SpO2: 94% 98% 91% 98%  Weight:      Height:        Intake/Output Summary (Last 24 hours) at 12/21/2021 1010 Last data filed at 12/21/2021 0230 Gross per 24 hour  Intake --  Output 750 ml  Net -750 ml   Filed Weights   12/19/21 2044  Weight: 66.2 kg    Examination:   General: Appearance:    Well developed, well nourished female who appears uncomfortable     Lungs:      respirations unlabored  Heart:    Normal heart rate.    MS:   All extremities are intact.    Neurologic:   Awake, alert, pleasantly consulted       Data Reviewed: I have personally reviewed following labs and imaging studies  CBC: Recent Labs  Lab 12/19/21 2330 12/20/21 0337 12/21/21 0528  WBC 18.9* 12.5* 11.6*  HGB 13.6 10.3* 10.0*  HCT 41.5 31.3* 30.5*  MCV 90.2 91.3 90.5  PLT 200 144* 941*   Basic Metabolic Panel: Recent Labs  Lab 12/19/21 2330 12/20/21 0500 12/21/21 0528  NA 135 136 133*  K 5.3* 4.9 4.5  CL 102 104 102  CO2 '24 24 25  '$ GLUCOSE 375* 255* 257*  BUN 37* 38* 40*  CREATININE 1.14* 1.07* 1.02*  CALCIUM 9.3 8.5* 8.4*   GFR: Estimated Creatinine Clearance: 39.8 mL/min (A) (by C-G formula based on SCr of 1.02 mg/dL (H)). Liver Function Tests: Recent Labs  Lab 12/19/21 2330 12/20/21 0500  AST 31 24  ALT 30 24  ALKPHOS 71 62  BILITOT 0.9 0.5  PROT 6.7 6.3*  ALBUMIN 3.6 3.2*   No results for input(s): "LIPASE", "AMYLASE" in the last 168 hours. No results for input(s): "AMMONIA" in the last 168 hours. Coagulation Profile: No results for input(s): "INR", "PROTIME" in the last 168  hours. Cardiac Enzymes: No results for input(s): "CKTOTAL", "CKMB", "CKMBINDEX", "TROPONINI" in the last 168 hours. BNP (last 3 results) No results for input(s): "PROBNP" in the last 8760 hours. HbA1C: No results for input(s): "HGBA1C" in the last 72 hours. CBG: Recent Labs  Lab 12/20/21 0805 12/20/21 1226 12/20/21 1633 12/20/21 2054 12/21/21 0730  GLUCAP 190* 295* 238* 309* 242*   Lipid Profile: No results for input(s): "CHOL", "HDL", "LDLCALC", "TRIG", "CHOLHDL", "LDLDIRECT" in the last 72 hours. Thyroid Function Tests: No results for input(s): "TSH", "T4TOTAL", "FREET4", "T3FREE", "THYROIDAB" in the last 72 hours. Anemia Panel: No results for input(s): "VITAMINB12", "FOLATE", "FERRITIN", "TIBC", "IRON", "RETICCTPCT" in the last 72 hours. Sepsis Labs: No results for input(s): "PROCALCITON", "LATICACIDVEN" in the last 168 hours.  No results found for this or any previous visit (from the past 240 hour(s)).       Radiology Studies: DG Hip Unilat With Pelvis 2-3 Views Right  Result Date: 12/19/2021 CLINICAL DATA:  Fall, right hip pain EXAM: DG HIP (WITH OR WITHOUT PELVIS) 2-3V RIGHT COMPARISON:  None FINDINGS: Fracture through the right superior pubic ramus. No proximal femoral fracture. No subluxation or dislocation. SI joints symmetric and unremarkable. IMPRESSION: Fracture through the right superior pubic ramus. Electronically Signed   By: Rolm Baptise M.D.   On: 12/19/2021 22:34   DG Humerus Right  Result Date: 12/19/2021 CLINICAL DATA:  Right shoulder injury.  Fell. EXAM: RIGHT HUMERUS - 2+ VIEW; RIGHT SHOULDER - 2+ VIEW COMPARISON:  None Available. FINDINGS: Mild osteopenia. There is acute impacted transverse surgical neck fracture of the proximal humerus with comminution between fracture margins and along the greater tuberosity. The distal fragment is translated anteriorly about 1/3 of a shaft width, with mild posterolateral distal-fragment angulation as well. No  dislocation is seen. [facility] joint is intact with circumferential spurring. Median sternotomy sutures are partially visible. The visualized right lung fields are clear. There is a chronic healed fracture deformity of the posterolateral right seventh rib. IMPRESSION: 1. Acute transverse surgical neck proximal right humeral fracture with impaction, comminution between fracture margins and along the greater tuberosity, and mild anterior translation of the distal fragment as well as mild posterolateral angulation. 2. Osteopenia and degenerative change. Electronically Signed   By: Telford Nab M.D.   On: 12/19/2021 20:54   DG Shoulder Right  Result Date: 12/19/2021 CLINICAL DATA:  Right shoulder injury.  Fell. EXAM: RIGHT HUMERUS - 2+ VIEW; RIGHT SHOULDER - 2+ VIEW COMPARISON:  None Available. FINDINGS: Mild osteopenia. There is acute impacted transverse surgical neck fracture of the proximal humerus with comminution between fracture margins and along the greater tuberosity. The distal fragment is translated anteriorly about 1/3 of a shaft width, with mild posterolateral distal-fragment angulation as well. No dislocation is seen. The  AC joint is intact with circumferential spurring. Median sternotomy sutures are partially visible. The visualized right lung fields are clear. There is a chronic healed fracture deformity of the posterolateral right seventh rib. IMPRESSION: 1. Acute transverse surgical neck proximal right humeral fracture with impaction, comminution between fracture margins and along the greater tuberosity, and mild anterior translation of the distal fragment as well as mild posterolateral angulation. 2. Osteopenia and degenerative change. Electronically Signed   By: Telford Nab M.D.   On: 12/19/2021 20:54   CT Head Wo Contrast  Result Date: 12/19/2021 CLINICAL DATA:  Patient fell but is unsure of when EXAM: CT HEAD WITHOUT CONTRAST TECHNIQUE: Contiguous axial images were obtained from the base  of the skull through the vertex without intravenous contrast. RADIATION DOSE REDUCTION: This exam was performed according to the departmental dose-optimization program which includes automated exposure control, adjustment of the mA and/or kV according to patient size and/or use of iterative reconstruction technique. COMPARISON:  CT 01/13/2021 FINDINGS: Brain: No intracranial hemorrhage, mass effect, or evidence of acute infarct. No hydrocephalus. No extra-axial fluid collection. Generalized cerebral atrophy. Ill-defined hypoattenuation within the cerebral white matter is nonspecific but consistent with chronic small vessel ischemic disease. Chronic left cerebellar infarct. Vascular: No hyperdense vessel. Intracranial arterial calcification. Skull: No fracture or focal lesion. Sinuses/Orbits: No acute finding. Trace air-fluid level in the right maxillary sinus with some frothy secretions anteriorly. Small air-fluid level in the left maxillary sinus. Other: None. IMPRESSION: No acute intracranial abnormality. Age-related atrophy and chronic microvascular ischemic change. Air-fluid levels in the maxillary sinuses suggestive acute sinusitis. Electronically Signed   By: Placido Sou M.D.   On: 12/19/2021 20:39        Scheduled Meds:  acetaminophen  1,000 mg Oral TID   apixaban  2.5 mg Oral BID   atorvastatin  80 mg Oral Daily   influenza vaccine adjuvanted  0.5 mL Intramuscular Tomorrow-1000   insulin aspart  0-5 Units Subcutaneous QHS   insulin aspart  0-9 Units Subcutaneous TID WC   insulin detemir  7 Units Subcutaneous BID   lidocaine  1 patch Transdermal Q24H   pantoprazole  40 mg Oral Daily   sodium chloride flush  3 mL Intravenous Q12H   venlafaxine XR  150 mg Oral Q breakfast   venlafaxine XR  75 mg Oral Q breakfast   Continuous Infusions:   LOS: 0 days    Time spent: 45 minutes spent on chart review, discussion with nursing staff, consultants, updating family and interview/physical  exam; more than 50% of that time was spent in counseling and/or coordination of care.    Geradine Girt, DO [facility] Available via Epic secure chat 7am-7pm After these hours, please refer to coverage provider listed on [email or web address] 12/21/2021, 10:10 AM

## 2021-12-22 ENCOUNTER — Encounter (HOSPITAL_COMMUNITY): Payer: Self-pay | Admitting: Internal Medicine

## 2021-12-22 DIAGNOSIS — Z794 Long term (current) use of insulin: Secondary | ICD-10-CM | POA: Diagnosis not present

## 2021-12-22 DIAGNOSIS — Z8679 Personal history of other diseases of the circulatory system: Secondary | ICD-10-CM | POA: Diagnosis not present

## 2021-12-22 DIAGNOSIS — E118 Type 2 diabetes mellitus with unspecified complications: Secondary | ICD-10-CM | POA: Diagnosis not present

## 2021-12-22 DIAGNOSIS — Z9889 Other specified postprocedural states: Secondary | ICD-10-CM | POA: Diagnosis not present

## 2021-12-22 DIAGNOSIS — N1831 Chronic kidney disease, stage 3a: Secondary | ICD-10-CM | POA: Diagnosis not present

## 2021-12-22 DIAGNOSIS — I480 Paroxysmal atrial fibrillation: Secondary | ICD-10-CM | POA: Diagnosis not present

## 2021-12-22 DIAGNOSIS — F418 Other specified anxiety disorders: Secondary | ICD-10-CM | POA: Diagnosis not present

## 2021-12-22 DIAGNOSIS — W19XXXA Unspecified fall, initial encounter: Secondary | ICD-10-CM | POA: Diagnosis not present

## 2021-12-22 DIAGNOSIS — Z952 Presence of prosthetic heart valve: Secondary | ICD-10-CM | POA: Diagnosis not present

## 2021-12-22 DIAGNOSIS — I10 Essential (primary) hypertension: Secondary | ICD-10-CM | POA: Diagnosis not present

## 2021-12-22 DIAGNOSIS — S42211A Unspecified displaced fracture of surgical neck of right humerus, initial encounter for closed fracture: Secondary | ICD-10-CM | POA: Diagnosis not present

## 2021-12-22 DIAGNOSIS — Z8673 Personal history of transient ischemic attack (TIA), and cerebral infarction without residual deficits: Secondary | ICD-10-CM | POA: Diagnosis not present

## 2021-12-22 LAB — GLUCOSE, CAPILLARY
Glucose-Capillary: 314 mg/dL — ABNORMAL HIGH (ref 70–99)
Glucose-Capillary: 317 mg/dL — ABNORMAL HIGH (ref 70–99)
Glucose-Capillary: 224 mg/dL — ABNORMAL HIGH (ref 70–99)
Glucose-Capillary: 275 mg/dL — ABNORMAL HIGH (ref 70–99)

## 2021-12-22 MED ORDER — INSULIN ASPART 100 UNIT/ML IJ SOLN
0.0000 [IU] | Freq: Three times a day (TID) | INTRAMUSCULAR | Status: DC
  Administered 2021-12-22 – 2021-12-23 (×2): 5 [IU] via SUBCUTANEOUS
  Administered 2021-12-23: 2 [IU] via SUBCUTANEOUS
  Administered 2021-12-23: 5 [IU] via SUBCUTANEOUS

## 2021-12-22 MED ORDER — INSULIN DETEMIR 100 UNIT/ML ~~LOC~~ SOLN
15.0000 [IU] | Freq: Two times a day (BID) | SUBCUTANEOUS | Status: DC
  Administered 2021-12-22: 15 [IU] via SUBCUTANEOUS
  Filled 2021-12-22 (×2): qty 0.15

## 2021-12-22 MED ORDER — MELATONIN 3 MG PO TABS
3.0000 mg | ORAL_TABLET | Freq: Every day | ORAL | Status: DC
  Administered 2021-12-22 – 2021-12-23 (×2): 3 mg via ORAL
  Filled 2021-12-22 (×2): qty 1

## 2021-12-22 MED ORDER — INSULIN DETEMIR 100 UNIT/ML ~~LOC~~ SOLN
20.0000 [IU] | Freq: Two times a day (BID) | SUBCUTANEOUS | Status: DC
  Administered 2021-12-22: 20 [IU] via SUBCUTANEOUS
  Filled 2021-12-22 (×2): qty 0.2

## 2021-12-22 NOTE — Progress Notes (Signed)
Patient is alert and oriented. She appears to be doing well despite recent fall and clinical diagnosis. Patient has a sling to the RUE with scattered bruising. Patient is able to take pills whole without complications. She endorses pain to the mid frontal lobe which resolved with PRN medication. LBM reported 3-4 days ago in which she endorses as a normal bowel regimen for her. Patients belly doesn't appear a little distended but she denies abdominal pain/tenderness, she is urinating well without difficulty.

## 2021-12-22 NOTE — Plan of Care (Signed)
Pt stable.

## 2021-12-22 NOTE — Progress Notes (Signed)
PROGRESS NOTE    Rachel Santos  ZDG:[id number] DOB: May 23, 1940 DOA: 12/19/2021 PCP: Lajean Manes, MD    Brief Narrative:   Baleria Wyman Bui is a 81 y.o. female with past medical history significant for paroxysmal atrial fibrillation s/p MAZE procedure on Eliquis, history of CVA, type 2 diabetes mellitus, CKD stage IIIa, AAS s/p TAVR, history of GI bleed, history of tricuspid/mitral valve repair, essential hypertension, anxiety/depression who presented to [facility] ED on 9/24 after being found on the floor at home after a fall.  Patient reported that she was walking her house without her cane or walker and tripped and fell onto her right side.  Denies hitting her head or losing consciousness.  She was complaining of significant pain to her right shoulder.  Denied any preceding symptoms to include lightheadedness.  Further denies fever, no chills, no chest pain, no shortness of breath, no abdominal pain, no constipation/diarrhea, no nausea/vomiting.  In the ED, vital signs stable.  WBC 18.9, hemoglobin 13.6, platelets 200.  Sodium 135, potassium 5.3, chloride 102, CO2 24, glucose 375, BUN 37, creatinine 1.14.  AST 31, ALT 30, total bilirubin 0.9.  High sensitive troponin 12.  Urinalysis with greater than 500 glucose, trace leukocytes, negative nitrite, rare bacteria, 21-50 WBCs.  CT head without contrast with no acute intracranial finding, age-related atrophy and chronic microvascular ischemic change.  Right humerus x-ray with acute transverse surgical neck fracture proximal right humeral fracture with impaction, combination between fracture margins along greater tuberosity.  Hip/pelvis x-ray with fracture through the right superior pubic ramus.  TRH consulted for further evaluation and management.  Assessment & Plan:    Right humerus fracture Right pubic rami fracture Patient presenting to ED following mechanical fall at home.  Usually utilizes a Programmer, multimedia but reported that she tripped and fell  without loss of consciousness.  No preceding concerning events.  Right shoulder/humerus x-ray with fracture of the surgical neck with impaction.  Right hip x-ray with superior rami fracture.  Case was discussed with orthopedics by previous hospitalist Dr. Lucianne Lei who reported nonsurgical management. -- Continue immobilization right upper extremity with a sling -- Pain control with Tylenol, lidocaine patch -- Seen by PT and OT with recommendation of SNF placement, TOC for assistance  Hyperkalemia: Resolved Potassium slightly elevated on admission to 5.3, now resolved with potassium down to 4.5.  CKD stage IIIa Creatinine 1.02, stable.  Type 2 diabetes mellitus with hyperglycemia Hemoglobin A1c 8.3 on 10/17/2021, not optimally controlled.  Home medications includes Levemir 30 units subcutaneously daily --Semglee 15u [place] BID --moderate SSI for coverage  Permanent atrial fibrillation s/p MAZE procedure --Eliquis 2.5 mg p.o. twice daily  Essential hypertension Currently not on antihypertensive therapy outpatient.  Hyperlipidemia -- Atorvastatin 80 mg p.o. daily  History of CVA --Continue statin and Eliquis  Anxiety/depression  -- Effexor 225 mg p.o. daily  GERD: Continue PPI  Hx tricuspid/mitral valve repair Hx aortic stenosis s/p TAVR -- Outpatient follow with cardiology  Cognitive impairment --Delirium precautions --Get up during the day --Encourage a familiar face to remain present throughout the day --Keep blinds open and lights on during daylight hours --Minimize the use of opioids/benzodiazepines --Melatonin 3 mg p.o. nightly   DVT prophylaxis: apixaban (ELIQUIS) tablet 2.5 mg Start: 12/20/21 1000 apixaban (ELIQUIS) tablet 2.5 mg    Code Status: Full Code Family Communication: Updated daughter present at bedside  Disposition Plan:  Level of care: Med-Surg Status is: Observation The patient remains OBS appropriate and will d/c before 2 midnights.  Consultants:   None  Procedures:  None  Antimicrobials:  None   Subjective: Patient seen examined bedside, resting comfortably.  No specific complaints this morning other than continued right shoulder and right sided pelvic pain.  Daughter present at bedside.  Awaiting SNF placement.  No other questions or concerns at this time.  Denies headache, no fever/chills, no nausea/vomiting/diarrhea, no chest pain, no palpitation, no dizziness, no abdominal pain, no focal weakness, no fatigue, no paresthesias.  No acute events overnight per nursing staff.  Objective: Vitals:   12/21/21 0124 12/21/21 1425 12/21/21 2134 12/22/21 0450  BP: 131/61 130/74 108/70 (!) 153/54  Pulse: 76 70 76 73  Resp: '20 17 20 19  '$ Temp: 100.3 F (37.9 C) 99.1 F (37.3 C) 98.7 F (37.1 C) 98.4 F (36.9 C)  TempSrc:  Oral    SpO2: 98% 90% 91% 91%  Weight:      Height:        Intake/Output Summary (Last 24 hours) at 12/22/2021 1434 Last data filed at 12/22/2021 1240 Gross per 24 hour  Intake 480 ml  Output 400 ml  Net 80 ml   Filed Weights   12/19/21 2044  Weight: 66.2 kg    Examination:  Physical Exam: GEN: NAD, alert, pleasantly confused, elderly in appearance HEENT: NCAT, PERRL, EOMI, sclera clear, MMM, noted ecchymosis to bilateral inferior orbits PULM: CTAB w/o wheezes/crackles, normal respiratory effort on room air CV: RRR w/o M/G/R GI: abd soft, NTND, NABS, no R/G/M MSK: Right upper extremity with sling in place, neurovascular intact, moves all extremities independently NEURO: CN II-XII intact, no focal deficits, sensation to light touch intact PSYCH: normal mood/affect Integumentary: Orbital ecchymosis as above, otherwise no concerning rash/lesions/wounds to exposed skin surfaces    Data Reviewed: I have personally reviewed following labs and imaging studies  CBC: Recent Labs  Lab 12/19/21 2330 12/20/21 0337 12/21/21 0528  WBC 18.9* 12.5* 11.6*  HGB 13.6 10.3* 10.0*  HCT 41.5 31.3* 30.5*  MCV  90.2 91.3 90.5  PLT 200 144* 595*   Basic Metabolic Panel: Recent Labs  Lab 12/19/21 2330 12/20/21 0500 12/21/21 0528  NA 135 136 133*  K 5.3* 4.9 4.5  CL 102 104 102  CO2 '24 24 25  '$ GLUCOSE 375* 255* 257*  BUN 37* 38* 40*  CREATININE 1.14* 1.07* 1.02*  CALCIUM 9.3 8.5* 8.4*   GFR: Estimated Creatinine Clearance: 39.8 mL/min (A) (by C-G formula based on SCr of 1.02 mg/dL (H)). Liver Function Tests: Recent Labs  Lab 12/19/21 2330 12/20/21 0500  AST 31 24  ALT 30 24  ALKPHOS 71 62  BILITOT 0.9 0.5  PROT 6.7 6.3*  ALBUMIN 3.6 3.2*   No results for input(s): "LIPASE", "AMYLASE" in the last 168 hours. No results for input(s): "AMMONIA" in the last 168 hours. Coagulation Profile: No results for input(s): "INR", "PROTIME" in the last 168 hours. Cardiac Enzymes: No results for input(s): "CKTOTAL", "CKMB", "CKMBINDEX", "TROPONINI" in the last 168 hours. BNP (last 3 results) No results for input(s): "PROBNP" in the last 8760 hours. HbA1C: No results for input(s): "HGBA1C" in the last 72 hours. CBG: Recent Labs  Lab 12/21/21 1149 12/21/21 1746 12/21/21 2109 12/22/21 0827 12/22/21 1137  GLUCAP 355* 203* 372* 314* 317*   Lipid Profile: No results for input(s): "CHOL", "HDL", "LDLCALC", "TRIG", "CHOLHDL", "LDLDIRECT" in the last 72 hours. Thyroid Function Tests: No results for input(s): "TSH", "T4TOTAL", "FREET4", "T3FREE", "THYROIDAB" in the last 72 hours. Anemia Panel: No results for input(s): "VITAMINB12", "FOLATE", "  FERRITIN", "TIBC", "IRON", "RETICCTPCT" in the last 72 hours. Sepsis Labs: No results for input(s): "PROCALCITON", "LATICACIDVEN" in the last 168 hours.  No results found for this or any previous visit (from the past 240 hour(s)).       Radiology Studies: No results found.      Scheduled Meds:  acetaminophen  1,000 mg Oral TID   apixaban  2.5 mg Oral BID   atorvastatin  80 mg Oral Daily   insulin aspart  0-5 Units Subcutaneous QHS    insulin aspart  0-9 Units Subcutaneous TID WC   insulin detemir  15 Units Subcutaneous BID   lidocaine  1 patch Transdermal Q24H   pantoprazole  40 mg Oral Daily   sodium chloride flush  3 mL Intravenous Q12H   venlafaxine XR  150 mg Oral Q breakfast   venlafaxine XR  75 mg Oral Q breakfast   Continuous Infusions:   LOS: 0 days    Time spent: 50 minutes spent on chart review, discussion with nursing staff, consultants, updating family and interview/physical exam; more than 50% of that time was spent in counseling and/or coordination of care.    Graeson Nouri J [place] DO [facility] Available via Epic secure chat 7am-7pm After these hours, please refer to coverage provider listed on [email or web address] 12/22/2021, 2:34 PM

## 2021-12-23 ENCOUNTER — Encounter (HOSPITAL_COMMUNITY): Payer: Self-pay | Admitting: Internal Medicine

## 2021-12-23 DIAGNOSIS — E1122 Type 2 diabetes mellitus with diabetic chronic kidney disease: Secondary | ICD-10-CM | POA: Diagnosis not present

## 2021-12-23 DIAGNOSIS — Z79899 Other long term (current) drug therapy: Secondary | ICD-10-CM | POA: Diagnosis not present

## 2021-12-23 DIAGNOSIS — S0083XA Contusion of other part of head, initial encounter: Secondary | ICD-10-CM | POA: Diagnosis not present

## 2021-12-23 DIAGNOSIS — E875 Hyperkalemia: Secondary | ICD-10-CM | POA: Diagnosis not present

## 2021-12-23 DIAGNOSIS — W19XXXA Unspecified fall, initial encounter: Secondary | ICD-10-CM | POA: Diagnosis not present

## 2021-12-23 DIAGNOSIS — Z7984 Long term (current) use of oral hypoglycemic drugs: Secondary | ICD-10-CM | POA: Diagnosis not present

## 2021-12-23 DIAGNOSIS — G3184 Mild cognitive impairment, so stated: Secondary | ICD-10-CM | POA: Diagnosis not present

## 2021-12-23 DIAGNOSIS — I2510 Atherosclerotic heart disease of native coronary artery without angina pectoris: Secondary | ICD-10-CM | POA: Diagnosis not present

## 2021-12-23 DIAGNOSIS — M6281 Muscle weakness (generalized): Secondary | ICD-10-CM | POA: Diagnosis not present

## 2021-12-23 DIAGNOSIS — Z23 Encounter for immunization: Secondary | ICD-10-CM | POA: Diagnosis not present

## 2021-12-23 DIAGNOSIS — S42211A Unspecified displaced fracture of surgical neck of right humerus, initial encounter for closed fracture: Secondary | ICD-10-CM | POA: Diagnosis not present

## 2021-12-23 DIAGNOSIS — Z8673 Personal history of transient ischemic attack (TIA), and cerebral infarction without residual deficits: Secondary | ICD-10-CM | POA: Diagnosis not present

## 2021-12-23 DIAGNOSIS — Z7901 Long term (current) use of anticoagulants: Secondary | ICD-10-CM | POA: Diagnosis not present

## 2021-12-23 DIAGNOSIS — N1831 Chronic kidney disease, stage 3a: Secondary | ICD-10-CM | POA: Diagnosis not present

## 2021-12-23 DIAGNOSIS — Z86718 Personal history of other venous thrombosis and embolism: Secondary | ICD-10-CM | POA: Diagnosis not present

## 2021-12-23 DIAGNOSIS — Z853 Personal history of malignant neoplasm of breast: Secondary | ICD-10-CM | POA: Diagnosis not present

## 2021-12-23 DIAGNOSIS — Z794 Long term (current) use of insulin: Secondary | ICD-10-CM | POA: Diagnosis not present

## 2021-12-23 DIAGNOSIS — Z954 Presence of other heart-valve replacement: Secondary | ICD-10-CM | POA: Diagnosis not present

## 2021-12-23 DIAGNOSIS — E1165 Type 2 diabetes mellitus with hyperglycemia: Secondary | ICD-10-CM | POA: Diagnosis not present

## 2021-12-23 DIAGNOSIS — S32501A Unspecified fracture of right pubis, initial encounter for closed fracture: Secondary | ICD-10-CM | POA: Diagnosis not present

## 2021-12-23 DIAGNOSIS — I480 Paroxysmal atrial fibrillation: Secondary | ICD-10-CM | POA: Diagnosis not present

## 2021-12-23 DIAGNOSIS — I129 Hypertensive chronic kidney disease with stage 1 through stage 4 chronic kidney disease, or unspecified chronic kidney disease: Secondary | ICD-10-CM | POA: Diagnosis not present

## 2021-12-23 DIAGNOSIS — R2681 Unsteadiness on feet: Secondary | ICD-10-CM | POA: Diagnosis not present

## 2021-12-23 LAB — GLUCOSE, CAPILLARY
Glucose-Capillary: 137 mg/dL — ABNORMAL HIGH (ref 70–99)
Glucose-Capillary: 223 mg/dL — ABNORMAL HIGH (ref 70–99)
Glucose-Capillary: 241 mg/dL — ABNORMAL HIGH (ref 70–99)
Glucose-Capillary: 178 mg/dL — ABNORMAL HIGH (ref 70–99)

## 2021-12-23 MED ORDER — INSULIN ASPART 100 UNIT/ML IJ SOLN
4.0000 [IU] | Freq: Three times a day (TID) | INTRAMUSCULAR | Status: DC
  Administered 2021-12-23 (×3): 4 [IU] via SUBCUTANEOUS

## 2021-12-23 MED ORDER — INSULIN DETEMIR 100 UNIT/ML ~~LOC~~ SOLN
25.0000 [IU] | Freq: Two times a day (BID) | SUBCUTANEOUS | Status: DC
  Administered 2021-12-23 (×2): 25 [IU] via SUBCUTANEOUS
  Filled 2021-12-23 (×3): qty 0.25

## 2021-12-23 MED ORDER — ONDANSETRON HCL 4 MG/2ML IJ SOLN
4.0000 mg | Freq: Four times a day (QID) | INTRAMUSCULAR | Status: DC | PRN
  Administered 2021-12-23: 4 mg via INTRAVENOUS
  Filled 2021-12-23: qty 2

## 2021-12-23 NOTE — TOC Progression Note (Addendum)
Transition of Care [facility]) - Progression Note    Patient Details  Name: Rachel Santos MRN: [id number] Date of Birth: 25-Oct-1940  [facility] CM/SW [place], LCSW Phone Number: 12/23/2021, 3:54 PM  Clinical Narrative:    Pt's family have chosen [facility] for SNF placement. Insurance Josem Kaufmann has been requested for placement and currently pending approval.   Update 4:05: Insurance authorization has been approved starting 9/28 to 10/2. Everlene Balls ID: [id number]   Expected Discharge Plan: Skilled Nursing Facility Barriers to Discharge: No Barriers Identified  Expected Discharge Plan and Services Expected Discharge Plan: [facility] In-house Referral: NA Discharge Planning Services: NA Post Acute Care Choice: [place] Living arrangements for the past 2 months: Apartment                 DME Arranged: N/A DME Agency: NA                   Social Determinants of Health (SDOH) Interventions    Readmission Risk Interventions    10/19/2021   12:59 PM  Readmission Risk Prevention Plan  Post Dischage Appt Complete  Medication Screening Complete  Transportation Screening Complete

## 2021-12-23 NOTE — Progress Notes (Signed)
PROGRESS NOTE    Rachel Santos  HQI:[id number] DOB: 02/24/1941 DOA: 12/19/2021 PCP: Lajean Manes, MD    Brief Narrative:   Rachel Santos is a 81 y.o. female with past medical history significant for paroxysmal atrial fibrillation s/p MAZE procedure on Eliquis, history of CVA, type 2 diabetes mellitus, CKD stage IIIa, AAS s/p TAVR, history of GI bleed, history of tricuspid/mitral valve repair, essential hypertension, anxiety/depression who presented to [facility] ED on 9/24 after being found on the floor at home after a fall.  Patient reported that she was walking her house without her cane or walker and tripped and fell onto her right side.  Denies hitting her head or losing consciousness.  She was complaining of significant pain to her right shoulder.  Denied any preceding symptoms to include lightheadedness.  Further denies fever, no chills, no chest pain, no shortness of breath, no abdominal pain, no constipation/diarrhea, no nausea/vomiting.  In the ED, vital signs stable.  WBC 18.9, hemoglobin 13.6, platelets 200.  Sodium 135, potassium 5.3, chloride 102, CO2 24, glucose 375, BUN 37, creatinine 1.14.  AST 31, ALT 30, total bilirubin 0.9.  High sensitive troponin 12.  Urinalysis with greater than 500 glucose, trace leukocytes, negative nitrite, rare bacteria, 21-50 WBCs.  CT head without contrast with no acute intracranial finding, age-related atrophy and chronic microvascular ischemic change.  Right humerus x-ray with acute transverse surgical neck fracture proximal right humeral fracture with impaction, combination between fracture margins along greater tuberosity.  Hip/pelvis x-ray with fracture through the right superior pubic ramus.  TRH consulted for further evaluation and management.  Assessment & Plan:    Right humerus fracture Right pubic rami fracture Patient presenting to ED following mechanical fall at home.  Usually utilizes a Programmer, multimedia but reported that she tripped and fell  without loss of consciousness.  No preceding concerning events.  Right shoulder/humerus x-ray with fracture of the surgical neck with impaction.  Right hip x-ray with superior rami fracture.  Case was discussed with orthopedics by previous hospitalist Dr. Lucianne Lei who reported nonsurgical management. -- Continue immobilization right upper extremity with a sling -- Pain control with Tylenol, lidocaine patch -- Seen by PT and OT with recommendation of SNF placement, TOC for assistance  Hyperkalemia: Resolved Potassium slightly elevated on admission to 5.3, now resolved with potassium down to 4.5.  CKD stage IIIa Creatinine 1.02, stable.  Type 2 diabetes mellitus with hyperglycemia Hemoglobin A1c 8.3 on 10/17/2021, not optimally controlled.  Home medications includes Levemir 30 units subcutaneously daily --Semglee 25u [place] BID --Novolog 4u TIDAC --moderate SSI for coverage  Permanent atrial fibrillation s/p MAZE procedure --Eliquis 2.5 mg p.o. twice daily  Essential hypertension Currently not on antihypertensive therapy outpatient.  Hyperlipidemia -- Atorvastatin 80 mg p.o. daily  History of CVA --Continue statin and Eliquis  Anxiety/depression  -- Effexor 225 mg p.o. daily  GERD: Continue PPI  Hx tricuspid/mitral valve repair Hx aortic stenosis s/p TAVR -- Outpatient follow with cardiology  Cognitive impairment --Delirium precautions --Get up during the day --Encourage a familiar face to remain present throughout the day --Keep blinds open and lights on during daylight hours --Minimize the use of opioids/benzodiazepines --Melatonin 3 mg p.o. nightly   DVT prophylaxis: apixaban (ELIQUIS) tablet 2.5 mg Start: 12/20/21 1000 apixaban (ELIQUIS) tablet 2.5 mg    Code Status: Full Code Family Communication: No family present at bedside this morning  Disposition Plan:  Level of care: Med-Surg Status is: Observation The patient remains OBS appropriate and will  d/c before 2  midnights.  Patient is medically stable for discharge, delay in discharge due to family unable to determine/agree on which SNF bed offer    Consultants:  None  Procedures:  None  Antimicrobials:  None   Subjective: Patient seen examined bedside, resting comfortably.  No specific complaints this morning other than continued right shoulder and right sided pelvic pain.  No family present.  Awaiting SNF placement.  No other questions or concerns at this time.  Denies headache, no fever/chills, no nausea/vomiting/diarrhea, no chest pain, no palpitation, no dizziness, no abdominal pain, no focal weakness, no fatigue, no paresthesias.  No acute events overnight per nursing staff.  Medically stable for discharge once SNF bed available.  Objective: Vitals:   12/22/21 0450 12/22/21 1400 12/22/21 1939 12/23/21 0404  BP: (!) 153/54 (!) 148/56 (!) 151/59 (!) 138/58  Pulse: 73 77 70 75  Resp: '19 19 16 14  '$ Temp: 98.4 F (36.9 C) 98.4 F (36.9 C) 99.6 F (37.6 C) 98.7 F (37.1 C)  TempSrc:   Oral Oral  SpO2: 91% 95% 92% 94%  Weight:      Height:        Intake/Output Summary (Last 24 hours) at 12/23/2021 1409 Last data filed at 12/23/2021 0406 Gross per 24 hour  Intake 3 ml  Output 800 ml  Net -797 ml   Filed Weights   12/19/21 2044  Weight: 66.2 kg    Examination:  Physical Exam: GEN: NAD, alert, pleasantly confused, elderly in appearance HEENT: NCAT, PERRL, EOMI, sclera clear, MMM, noted ecchymosis to bilateral inferior orbits PULM: CTAB w/o wheezes/crackles, normal respiratory effort on room air CV: RRR w/o M/G/R GI: abd soft, NTND, NABS, no R/G/M MSK: Right upper extremity with sling in place, neurovascular intact, moves all extremities independently NEURO: CN II-XII intact, no focal deficits, sensation to light touch intact PSYCH: normal mood/affect Integumentary: Orbital ecchymosis as above, otherwise no concerning rash/lesions/wounds to exposed skin surfaces    Data  Reviewed: I have personally reviewed following labs and imaging studies  CBC: Recent Labs  Lab 12/19/21 2330 12/20/21 0337 12/21/21 0528  WBC 18.9* 12.5* 11.6*  HGB 13.6 10.3* 10.0*  HCT 41.5 31.3* 30.5*  MCV 90.2 91.3 90.5  PLT 200 144* 440*   Basic Metabolic Panel: Recent Labs  Lab 12/19/21 2330 12/20/21 0500 12/21/21 0528  NA 135 136 133*  K 5.3* 4.9 4.5  CL 102 104 102  CO2 '24 24 25  '$ GLUCOSE 375* 255* 257*  BUN 37* 38* 40*  CREATININE 1.14* 1.07* 1.02*  CALCIUM 9.3 8.5* 8.4*   GFR: Estimated Creatinine Clearance: 39.8 mL/min (A) (by C-G formula based on SCr of 1.02 mg/dL (H)). Liver Function Tests: Recent Labs  Lab 12/19/21 2330 12/20/21 0500  AST 31 24  ALT 30 24  ALKPHOS 71 62  BILITOT 0.9 0.5  PROT 6.7 6.3*  ALBUMIN 3.6 3.2*   No results for input(s): "LIPASE", "AMYLASE" in the last 168 hours. No results for input(s): "AMMONIA" in the last 168 hours. Coagulation Profile: No results for input(s): "INR", "PROTIME" in the last 168 hours. Cardiac Enzymes: No results for input(s): "CKTOTAL", "CKMB", "CKMBINDEX", "TROPONINI" in the last 168 hours. BNP (last 3 results) No results for input(s): "PROBNP" in the last 8760 hours. HbA1C: No results for input(s): "HGBA1C" in the last 72 hours. CBG: Recent Labs  Lab 12/22/21 1137 12/22/21 1718 12/22/21 2123 12/23/21 0752 12/23/21 1156  GLUCAP 317* 224* 275* 137* 223*   Lipid Profile: No  results for input(s): "CHOL", "HDL", "LDLCALC", "TRIG", "CHOLHDL", "LDLDIRECT" in the last 72 hours. Thyroid Function Tests: No results for input(s): "TSH", "T4TOTAL", "FREET4", "T3FREE", "THYROIDAB" in the last 72 hours. Anemia Panel: No results for input(s): "VITAMINB12", "FOLATE", "FERRITIN", "TIBC", "IRON", "RETICCTPCT" in the last 72 hours. Sepsis Labs: No results for input(s): "PROCALCITON", "LATICACIDVEN" in the last 168 hours.  No results found for this or any previous visit (from the past 240 hour(s)).        Radiology Studies: No results found.      Scheduled Meds:  acetaminophen  1,000 mg Oral TID   apixaban  2.5 mg Oral BID   atorvastatin  80 mg Oral Daily   insulin aspart  0-15 Units Subcutaneous TID WC   insulin aspart  4 Units Subcutaneous TID WC   insulin detemir  25 Units Subcutaneous BID   lidocaine  1 patch Transdermal Q24H   melatonin  3 mg Oral QHS   pantoprazole  40 mg Oral Daily   sodium chloride flush  3 mL Intravenous Q12H   venlafaxine XR  150 mg Oral Q breakfast   venlafaxine XR  75 mg Oral Q breakfast   Continuous Infusions:   LOS: 0 days    Time spent: 50 minutes spent on chart review, discussion with nursing staff, consultants, updating family and interview/physical exam; more than 50% of that time was spent in counseling and/or coordination of care.    Valissa Lyvers J [place] DO [facility] Available via Epic secure chat 7am-7pm After these hours, please refer to coverage provider listed on [email or web address] 12/23/2021, 2:09 PM

## 2021-12-23 NOTE — TOC Progression Note (Signed)
Transition of Care [facility]) - Progression Note    Patient Details  Name: HARMONIE VERRASTRO MRN: [id number] Date of Birth: 25-Nov-1940  Transition of Care [facility]) CM/SW [place], [place] Phone Number: 12/23/2021, 9:09 AM  Clinical Narrative:    CSW spoke with pt's daughter [name], who shares they have visited some SNF's however she and her two other sisters have been unable to agree on placement. CSW asked about pt's input is on placement and was told that pt does not want to go at all. CSW reiterated with pt's daughter that pt is medically ready to discharge and they will need to choose SNF placement today. Pt's daughter agrees to have decision to CSW today regarding SNF placement.    Expected Discharge Plan: Skilled Nursing Facility Barriers to Discharge: No Barriers Identified  Expected Discharge Plan and Services Expected Discharge Plan: [place] In-house Referral: NA Discharge Planning Services: NA Post Acute Care Choice: [place] Living arrangements for the past 2 months: Apartment                 DME Arranged: N/A DME Agency: NA                   Social Determinants of Health (SDOH) Interventions    Readmission Risk Interventions    10/19/2021   12:59 PM  Readmission Risk Prevention Plan  Post Dischage Appt Complete  Medication Screening Complete  Transportation Screening Complete

## 2021-12-23 NOTE — Progress Notes (Signed)
Physical Therapy Treatment Patient Details Name: Rachel Santos MRN: [id number] DOB: 1941-03-05 Today's Date: 12/23/2021   History of Present Illness Patient is 81 y.o. female presented to ED after fall at home. CT head showed no acute abnormality.  Right humerus and right shoulder x-ray showed fracture at the surgical neck with impaction.  Hip x-ray on the right showed superior pubic rami fracture. 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    PT Comments    Patient resting in bed at start but easily alerted and willing to mobilize. Mod Assist to sit up to EOB and +2 to scoot to edge with bed pad safely. Pt is NWB on Rt UE and HHA on Lt provided with +2 assist on Rt side using gait bed and support at hip and trunk. Therapist blocking bil knees facilitate extension to fully stand at EOB and manual assist provided to weight shift Rt/Lt for lateral stepping to move bed>chair. EOS pt repositioned for comfort and self care completed with assist for washing/combing hair. Daughter sitting at bedside with pt and chair alarm on. RN notified of method for safe transfer back to bed. Pt will benefit from ST rehab stay to regain function and independence with mobility.     Recommendations for follow up therapy are one component of a multi-disciplinary discharge planning process, led by the attending physician.  Recommendations may be updated based on patient status, additional functional criteria and insurance authorization.  Follow Up Recommendations  Skilled nursing-short term rehab (<3 hours/day) Can patient physically be transported by private vehicle: No   Assistance Recommended at Discharge Frequent or constant Supervision/Assistance  Patient can return home with the following Two people to help  with walking and/or transfers;Two people to help with bathing/dressing/bathroom;Assistance with cooking/housework;Direct supervision/assist for medications management;Assist for transportation;Help with stairs or ramp for entrance   Equipment Recommendations  None recommended by PT    Recommendations for Other Services       Precautions / Restrictions Precautions Precautions: Fall Precaution Comments: Per Dr. Eulogio Bear, DO ortho consult states not operable fractures (arm - sling and NWB, hips WBAT with walker (if able)). - no order in chart. Required Braces or Orthoses: Sling Restrictions Weight Bearing Restrictions: Yes RUE Weight Bearing: Non weight bearing RLE Weight Bearing: Weight bearing as tolerated Other Position/Activity Restrictions: sling on Rt UE     Mobility  Bed Mobility Overal bed mobility: Needs Assistance Bed Mobility: Supine to Sit     Supine to sit: Mod assist, HOB elevated     General bed mobility comments: Pt using Lt UE and LE's to initaite moving to EOB. Mod assist to begin and raise trunk. +2 mod with bed pad to scoot to EOB fully.    Transfers Overall transfer level: Needs assistance Equipment used: 2 person hand held assist Transfers: Sit to/from Stand, Bed to chair/wheelchair/BSC Sit to Stand: Mod assist, +2 physical assistance, +2 safety/equipment, From elevated surface   Step pivot transfers: Mod assist, +2 physical assistance, +2 safety/equipment, From elevated surface       General transfer comment: Mod +2 for rise from EOB, multimodal cues for hand placement and assist at hips/trunk to fully rise. PT blocking LE's and facilitating extension to fully stand. 2x from EOB and Mod +2 assist to weigth shift Lt for Rt side step and  blocking Rt knee to prevent buckling with Lt side step to move bed>chair.    Ambulation/Gait                   Stairs             Wheelchair Mobility    Modified Rankin (Stroke Patients Only)        Balance Overall balance assessment: Needs assistance, History of Falls Sitting-balance support: Feet supported, Single extremity supported Sitting balance-Leahy Scale: Fair     Standing balance support: Single extremity supported (reliant on external support) Standing balance-Leahy Scale: Poor                              Cognition Arousal/Alertness: Awake/alert Behavior During Therapy: WFL for tasks assessed/performed Overall Cognitive Status: Impaired/Different from baseline Area of Impairment: Orientation, Attention, Memory, Following commands, Problem solving, Awareness                 Orientation Level: Disoriented to, Situation, Place, Time Current Attention Level: Sustained Memory: Decreased short-term memory Following Commands: Follows one step commands with increased time, Follows multi-step commands inconsistently, Follows one step commands inconsistently   Awareness: Emergent Problem Solving: Decreased initiation, Difficulty sequencing, Requires verbal cues, Requires tactile cues, Slow processing General Comments: pt more confused today, pt's daughter present and expressed concerns over possible lack of sleep. pt more alert once mobilizing but confused. easily redirected.        Exercises      General Comments        Pertinent Vitals/Pain Pain Assessment Pain Assessment: Faces Faces Pain Scale: Hurts little more Pain Location: Rt side, Arm/Leg when moving Pain Descriptors / Indicators: Discomfort, Aching, Moaning Pain Intervention(s): Limited activity within patient's tolerance, Monitored during session, Repositioned    Home Living                          Prior Function            PT Goals (current goals can now be found in the care plan section) Acute Rehab PT Goals Patient Stated Goal: get well enough to go home PT Goal Formulation: With patient/family Time For Goal Achievement: 01/03/22 Potential to Achieve  Goals: Fair Progress towards PT goals: Progressing toward goals    Frequency    Min 3X/week      PT Plan Current plan remains appropriate    Co-evaluation              AM-PAC PT "6 Clicks" Mobility   Outcome Measure  Help needed turning from your back to your side while in a flat bed without using bedrails?: A Lot Help needed moving from lying on your back to sitting on the side of a flat bed without using bedrails?: A Lot Help needed moving to and from a bed to a chair (including a wheelchair)?: Total Help needed standing up from a chair using your arms (e.g., wheelchair or bedside chair)?: Total Help needed to walk in hospital room?: Total Help needed climbing 3-5 steps with a railing? : Total 6 Click Score: 8    End of Session Equipment Utilized During Treatment: Gait belt;Other (comment) (Rt UE sling) Activity Tolerance: Patient tolerated treatment well;Patient limited by pain Patient left: in bed;with call bell/phone within reach;with nursing/sitter in room;with family/visitor present Nurse Communication: Mobility status PT Visit Diagnosis: Unsteadiness on feet (R26.81);Muscle weakness (generalized) (M62.81);Difficulty in walking, not  elsewhere classified (R26.2)     Time: 8325-4982 PT Time Calculation (min) (ACUTE ONLY): 39 min  Charges:  $Therapeutic Activity: 23-37 mins (some time EOS for self care with shampoo cap and combing hair)                     Gwynneth Albright PT, DPT Acute Rehabilitation Services Office [phone]  12/23/21 4:15 PM

## 2021-12-24 DIAGNOSIS — Z7984 Long term (current) use of oral hypoglycemic drugs: Secondary | ICD-10-CM | POA: Diagnosis not present

## 2021-12-24 DIAGNOSIS — D649 Anemia, unspecified: Secondary | ICD-10-CM | POA: Diagnosis not present

## 2021-12-24 DIAGNOSIS — E785 Hyperlipidemia, unspecified: Secondary | ICD-10-CM | POA: Diagnosis not present

## 2021-12-24 DIAGNOSIS — I129 Hypertensive chronic kidney disease with stage 1 through stage 4 chronic kidney disease, or unspecified chronic kidney disease: Secondary | ICD-10-CM | POA: Diagnosis not present

## 2021-12-24 DIAGNOSIS — I2510 Atherosclerotic heart disease of native coronary artery without angina pectoris: Secondary | ICD-10-CM | POA: Diagnosis not present

## 2021-12-24 DIAGNOSIS — Z86718 Personal history of other venous thrombosis and embolism: Secondary | ICD-10-CM | POA: Diagnosis not present

## 2021-12-24 DIAGNOSIS — Z952 Presence of prosthetic heart valve: Secondary | ICD-10-CM | POA: Diagnosis not present

## 2021-12-24 DIAGNOSIS — E875 Hyperkalemia: Secondary | ICD-10-CM | POA: Diagnosis not present

## 2021-12-24 DIAGNOSIS — Z7901 Long term (current) use of anticoagulants: Secondary | ICD-10-CM | POA: Diagnosis not present

## 2021-12-24 DIAGNOSIS — E118 Type 2 diabetes mellitus with unspecified complications: Secondary | ICD-10-CM | POA: Diagnosis not present

## 2021-12-24 DIAGNOSIS — R52 Pain, unspecified: Secondary | ICD-10-CM | POA: Diagnosis not present

## 2021-12-24 DIAGNOSIS — S42214A Unspecified nondisplaced fracture of surgical neck of right humerus, initial encounter for closed fracture: Secondary | ICD-10-CM | POA: Diagnosis not present

## 2021-12-24 DIAGNOSIS — N1831 Chronic kidney disease, stage 3a: Secondary | ICD-10-CM | POA: Diagnosis not present

## 2021-12-24 DIAGNOSIS — K219 Gastro-esophageal reflux disease without esophagitis: Secondary | ICD-10-CM | POA: Diagnosis not present

## 2021-12-24 DIAGNOSIS — S42211A Unspecified displaced fracture of surgical neck of right humerus, initial encounter for closed fracture: Secondary | ICD-10-CM | POA: Diagnosis not present

## 2021-12-24 DIAGNOSIS — S0083XA Contusion of other part of head, initial encounter: Secondary | ICD-10-CM | POA: Diagnosis not present

## 2021-12-24 DIAGNOSIS — E1122 Type 2 diabetes mellitus with diabetic chronic kidney disease: Secondary | ICD-10-CM | POA: Diagnosis not present

## 2021-12-24 DIAGNOSIS — Z9181 History of falling: Secondary | ICD-10-CM | POA: Diagnosis not present

## 2021-12-24 DIAGNOSIS — Z7401 Bed confinement status: Secondary | ICD-10-CM | POA: Diagnosis not present

## 2021-12-24 DIAGNOSIS — H9193 Unspecified hearing loss, bilateral: Secondary | ICD-10-CM | POA: Diagnosis not present

## 2021-12-24 DIAGNOSIS — S32501A Unspecified fracture of right pubis, initial encounter for closed fracture: Secondary | ICD-10-CM | POA: Diagnosis not present

## 2021-12-24 DIAGNOSIS — S42301D Unspecified fracture of shaft of humerus, right arm, subsequent encounter for fracture with routine healing: Secondary | ICD-10-CM | POA: Diagnosis not present

## 2021-12-24 DIAGNOSIS — B3749 Other urogenital candidiasis: Secondary | ICD-10-CM | POA: Diagnosis not present

## 2021-12-24 DIAGNOSIS — N390 Urinary tract infection, site not specified: Secondary | ICD-10-CM | POA: Diagnosis not present

## 2021-12-24 DIAGNOSIS — I639 Cerebral infarction, unspecified: Secondary | ICD-10-CM | POA: Diagnosis not present

## 2021-12-24 DIAGNOSIS — R2681 Unsteadiness on feet: Secondary | ICD-10-CM | POA: Diagnosis not present

## 2021-12-24 DIAGNOSIS — I4891 Unspecified atrial fibrillation: Secondary | ICD-10-CM | POA: Diagnosis not present

## 2021-12-24 DIAGNOSIS — I119 Hypertensive heart disease without heart failure: Secondary | ICD-10-CM | POA: Diagnosis not present

## 2021-12-24 DIAGNOSIS — Z954 Presence of other heart-valve replacement: Secondary | ICD-10-CM | POA: Diagnosis not present

## 2021-12-24 DIAGNOSIS — I480 Paroxysmal atrial fibrillation: Secondary | ICD-10-CM | POA: Diagnosis not present

## 2021-12-24 DIAGNOSIS — Z8679 Personal history of other diseases of the circulatory system: Secondary | ICD-10-CM | POA: Diagnosis not present

## 2021-12-24 DIAGNOSIS — B3731 Acute candidiasis of vulva and vagina: Secondary | ICD-10-CM | POA: Diagnosis not present

## 2021-12-24 DIAGNOSIS — Z4689 Encounter for fitting and adjustment of other specified devices: Secondary | ICD-10-CM | POA: Diagnosis not present

## 2021-12-24 DIAGNOSIS — E1165 Type 2 diabetes mellitus with hyperglycemia: Secondary | ICD-10-CM | POA: Diagnosis not present

## 2021-12-24 DIAGNOSIS — M6281 Muscle weakness (generalized): Secondary | ICD-10-CM | POA: Diagnosis not present

## 2021-12-24 DIAGNOSIS — Z853 Personal history of malignant neoplasm of breast: Secondary | ICD-10-CM | POA: Diagnosis not present

## 2021-12-24 DIAGNOSIS — W19XXXD Unspecified fall, subsequent encounter: Secondary | ICD-10-CM | POA: Diagnosis not present

## 2021-12-24 DIAGNOSIS — G3184 Mild cognitive impairment, so stated: Secondary | ICD-10-CM | POA: Diagnosis not present

## 2021-12-24 DIAGNOSIS — Z743 Need for continuous supervision: Secondary | ICD-10-CM | POA: Diagnosis not present

## 2021-12-24 DIAGNOSIS — S32511D Fracture of superior rim of right pubis, subsequent encounter for fracture with routine healing: Secondary | ICD-10-CM | POA: Diagnosis not present

## 2021-12-24 DIAGNOSIS — S060X0D Concussion without loss of consciousness, subsequent encounter: Secondary | ICD-10-CM | POA: Diagnosis not present

## 2021-12-24 DIAGNOSIS — Z794 Long term (current) use of insulin: Secondary | ICD-10-CM | POA: Diagnosis not present

## 2021-12-24 DIAGNOSIS — M170 Bilateral primary osteoarthritis of knee: Secondary | ICD-10-CM | POA: Diagnosis not present

## 2021-12-24 DIAGNOSIS — E538 Deficiency of other specified B group vitamins: Secondary | ICD-10-CM | POA: Diagnosis not present

## 2021-12-24 DIAGNOSIS — R531 Weakness: Secondary | ICD-10-CM | POA: Diagnosis not present

## 2021-12-24 DIAGNOSIS — Z79899 Other long term (current) drug therapy: Secondary | ICD-10-CM | POA: Diagnosis not present

## 2021-12-24 DIAGNOSIS — F418 Other specified anxiety disorders: Secondary | ICD-10-CM | POA: Diagnosis not present

## 2021-12-24 DIAGNOSIS — E1121 Type 2 diabetes mellitus with diabetic nephropathy: Secondary | ICD-10-CM | POA: Diagnosis not present

## 2021-12-24 DIAGNOSIS — K5900 Constipation, unspecified: Secondary | ICD-10-CM | POA: Diagnosis not present

## 2021-12-24 DIAGNOSIS — N182 Chronic kidney disease, stage 2 (mild): Secondary | ICD-10-CM | POA: Diagnosis not present

## 2021-12-24 DIAGNOSIS — Z23 Encounter for immunization: Secondary | ICD-10-CM | POA: Diagnosis not present

## 2021-12-24 DIAGNOSIS — R6889 Other general symptoms and signs: Secondary | ICD-10-CM | POA: Diagnosis not present

## 2021-12-24 DIAGNOSIS — S32591A Other specified fracture of right pubis, initial encounter for closed fracture: Secondary | ICD-10-CM | POA: Diagnosis not present

## 2021-12-24 DIAGNOSIS — W19XXXA Unspecified fall, initial encounter: Secondary | ICD-10-CM | POA: Diagnosis not present

## 2021-12-24 DIAGNOSIS — I10 Essential (primary) hypertension: Secondary | ICD-10-CM | POA: Diagnosis not present

## 2021-12-24 DIAGNOSIS — S42211D Unspecified displaced fracture of surgical neck of right humerus, subsequent encounter for fracture with routine healing: Secondary | ICD-10-CM | POA: Diagnosis not present

## 2021-12-24 DIAGNOSIS — Z9889 Other specified postprocedural states: Secondary | ICD-10-CM | POA: Diagnosis not present

## 2021-12-24 DIAGNOSIS — Z8673 Personal history of transient ischemic attack (TIA), and cerebral infarction without residual deficits: Secondary | ICD-10-CM | POA: Diagnosis not present

## 2021-12-24 LAB — GLUCOSE, CAPILLARY
Glucose-Capillary: 89 mg/dL (ref 70–99)
Glucose-Capillary: 221 mg/dL — ABNORMAL HIGH (ref 70–99)

## 2021-12-24 MED ORDER — INSULIN ASPART 100 UNIT/ML IJ SOLN: 6.0000 [IU] | Freq: Three times a day (TID) | INTRAMUSCULAR | Status: DC

## 2021-12-24 MED ORDER — LIDOCAINE 5 % EX PTCH: 1.0000 | MEDICATED_PATCH | CUTANEOUS | 0 refills | Status: DC

## 2021-12-24 MED ORDER — INSULIN DETEMIR 100 UNIT/ML ~~LOC~~ SOLN: 28.0000 [IU] | Freq: Two times a day (BID) | SUBCUTANEOUS | 11 refills | Status: DC

## 2021-12-24 MED ORDER — INSULIN DETEMIR 100 UNIT/ML ~~LOC~~ SOLN
28.0000 [IU] | Freq: Two times a day (BID) | SUBCUTANEOUS | Status: DC
  Administered 2021-12-24: 28 [IU] via SUBCUTANEOUS
  Filled 2021-12-24 (×2): qty 0.28

## 2021-12-24 NOTE — TOC Transition Note (Addendum)
Transition of Care [facility]) - CM/SW Discharge Note   Patient Details  Name: Rachel Santos MRN: [id number] Date of Birth: Nov 05, 1940  [facility] of [facility] CM/SW Contact:  Dessa Phi, RN Phone Number: 12/24/2021, 10:36 AM   Clinical Narrative:Per prior notes received auth for [facility]-d/c summary sent to rep Christy @ [facility]-going to rm#[id number],report tel#[phone]. PTAR called. No further CM needs.       Final next level of care: Skilled Nursing Facility Barriers to Discharge: No Barriers Identified   Patient Goals and CMS Choice Patient states their goals for this hospitalization and ongoing recovery are:: To return home CMS Medicare.gov Compare Post Acute Care list provided to:: Patient Choice offered to / list presented to : Patient, Adult Children  Discharge Placement              Patient chooses bed at: Other - please specify in the comment section below: ([facility]) Patient to be transferred to facility by:  Corey Harold) Name of family member notified:  (Debbie(dtr)) Patient and family notified of of transfer: 12/24/21  Discharge Plan and Services In-house Referral: NA Discharge Planning Services: NA Post Acute Care Choice: [place]          DME Arranged: N/A DME Agency: NA                  Social Determinants of Health (SDOH) Interventions     Readmission Risk Interventions    10/19/2021   12:59 PM  Readmission Risk Prevention Plan  Post Dischage Appt Complete  Medication Screening Complete  Transportation Screening Complete

## 2021-12-24 NOTE — Plan of Care (Signed)

## 2021-12-24 NOTE — Discharge Summary (Signed)
Physician Discharge Summary  Rachel Santos XAJ:[id number] DOB: [birth year] DOA: 12/19/2021  PCP: Lajean Manes, MD  Admit date: 12/19/2021 Discharge date: 12/24/2021  Admitted From: Home Disposition: [facility]  Recommendations for Outpatient Follow-up:  Follow up with PCP in 1-2 weeks Follow-up with orthopedics 2 weeks regarding humerus fracture, pubic rami fracture  Discharge Condition: Stable CODE STATUS: Full code Diet recommendation: Heart healthy/consistent carb regular diet  History of present illness:  Rachel Santos is a 81 y.o. female with past medical history significant for paroxysmal atrial fibrillation s/p MAZE procedure on Eliquis, history of CVA, type 2 diabetes mellitus, CKD stage IIIa, AAS s/p TAVR, history of GI bleed, history of tricuspid/mitral valve repair, essential hypertension, anxiety/depression who presented to [facility] ED on 9/24 after being found on the floor at home after a fall.  Patient reported that she was walking her house without her cane or walker and tripped and fell onto her right side.  Denies hitting her head or losing consciousness.  She was complaining of significant pain to her right shoulder.  Denied any preceding symptoms to include lightheadedness.  Further denies fever, no chills, no chest pain, no shortness of breath, no abdominal pain, no constipation/diarrhea, no nausea/vomiting.   In the ED, vital signs stable.  WBC 18.9, hemoglobin 13.6, platelets 200.  Sodium 135, potassium 5.3, chloride 102, CO2 24, glucose 375, BUN 37, creatinine 1.14.  AST 31, ALT 30, total bilirubin 0.9.  High sensitive troponin 12.  Urinalysis with greater than 500 glucose, trace leukocytes, negative nitrite, rare bacteria, 21-50 WBCs.  CT head without contrast with no acute intracranial finding, age-related atrophy and chronic microvascular ischemic change.  Right humerus x-ray with acute transverse surgical neck fracture proximal right humeral fracture with  impaction, combination between fracture margins along greater tuberosity.  Hip/pelvis x-ray with fracture through the right superior pubic ramus.  TRH consulted for further evaluation and management.  Hospital course:  Right humerus fracture Right pubic rami fracture Patient presenting to ED following mechanical fall at home.  Usually utilizes a Program[employer] but reported that she tripped and fell without loss of consciousness.  No preceding concerning events.  Right shoulder/humerus x-ray with fracture of the surgical neck with impaction.  Right hip x-ray with superior rami fracture.  Case was discussed with orthopedics by previous hospitalist Dr. Eliseo Squires who reported nonsurgical management. Continue immobilization right upper extremity with a sling.  Pain control with Tylenol, lidocaine patch.  Discharging to SNF for further rehabilitation.  Outpatient follow-up with orthopedics.   Hyperkalemia: Resolved Potassium slightly elevated on admission to 5.3, now resolved with potassium down to 4.5.   CKD stage IIIa Creatinine 1.02, stable.   Type 2 diabetes mellitus with hyperglycemia Hemoglobin A1c 8.3 on 10/17/2021, not optimally controlled.  Levemir increased to 28 units subcutaneously twice daily.  Continue to monitor blood sugars closely and adjust regimen as indicated.   Permanent atrial fibrillation s/p MAZE procedure Eliquis 2.5 mg p.o. twice daily   Essential hypertension Currently not on antihypertensive therapy outpatient.   Hyperlipidemia Atorvastatin 80 mg p.o. daily   History of CVA Continue statin and Eliquis   Anxiety/depression  Effexor 225 mg p.o. daily   GERD: Continue PPI   Hx tricuspid/mitral valve repair Hx aortic stenosis s/p TAVR -- Outpatient follow up with cardiology   Cognitive impairment Minimize the use of opioids/benzodiazepines    Discharge Diagnoses:  Principal Problem:   Fall Active Problems:   PAF (paroxysmal atrial fibrillation) ([place])   History  of  CVA (cerebrovascular accident)   Type 2 diabetes mellitus with complication, with long-term current use of insulin (HCC)   Chronic kidney disease, stage 3a (HCC)   S/P mitral valve repair   S/P Maze operation for atrial fibrillation   S/P TVR (tricuspid valve repair)   HTN (hypertension)   S/P TAVR (transcatheter aortic valve replacement)   Depression with anxiety   Humerus fracture    Discharge Instructions  Discharge Instructions     Call MD for:  difficulty breathing, headache or visual disturbances   Complete by: As directed    Call MD for:  extreme fatigue   Complete by: As directed    Call MD for:  persistant dizziness or light-headedness   Complete by: As directed    Call MD for:  persistant nausea and vomiting   Complete by: As directed    Call MD for:  severe uncontrolled pain   Complete by: As directed    Call MD for:  temperature >100.4   Complete by: As directed    Diet - low sodium heart healthy   Complete by: As directed    Increase activity slowly   Complete by: As directed       Allergies as of 12/24/2021       Reactions   Buspirone Hcl    lethargic/tired   Metformin And Related Diarrhea   Ozempic (0.25 Or 0.5 Mg-dose) [semaglutide(0.25 Or 0.'5mg'$ -dos)]    Other reaction(s): nausea   Ultram [tramadol] Nausea And Vomiting   Nickel Rash   Pt unable to wear jewelry made of nickel.   Pneumococcal Vaccines Swelling, Rash   At injection set        Medication List     STOP taking these medications    insulin regular 100 units/mL injection Commonly known as: NOVOLIN R       TAKE these medications    acetaminophen 500 MG tablet Commonly known as: TYLENOL Take 1,000 mg by mouth every 6 (six) hours as needed for moderate pain or headache.   apixaban 2.5 MG Tabs tablet Commonly known as: ELIQUIS Take 1 tablet (2.5 mg total) by mouth 2 (two) times daily.   atorvastatin 80 MG tablet Commonly known as: LIPITOR Take 1 tablet by mouth once  daily   CALCIUM 600 + D PO Take 1 tablet by mouth 2 (two) times daily.   dapagliflozin propanediol 10 MG Tabs tablet Commonly known as: FARXIGA Take 10 mg by mouth daily.   docusate sodium 100 MG capsule Commonly known as: COLACE Take 100-200 mg by mouth daily as needed for mild constipation.   insulin detemir 100 UNIT/ML injection Commonly known as: LEVEMIR Inject 0.28 mLs (28 Units total) into the skin 2 (two) times daily. What changed:  how much to take additional instructions   lidocaine 5 % Commonly known as: LIDODERM Place 1 patch onto the skin daily. Remove & Discard patch within 12 hours or as directed by MD   multivitamin with minerals tablet Take 1 tablet by mouth daily.   nitroGLYCERIN 0.4 MG SL tablet Commonly known as: NITROSTAT Place 1 tablet (0.4 mg total) under the tongue every 5 (five) minutes as needed for chest pain.   omeprazole 40 MG capsule Commonly known as: PRILOSEC Take 40 mg by mouth daily before breakfast.   ondansetron 4 MG tablet Commonly known as: ZOFRAN Take 4 mg by mouth every 8 (eight) hours as needed for nausea or vomiting.   polyethylene glycol powder 17 GM/SCOOP powder Commonly known  as: MiraLax Take 17 g by mouth 2 (two) times daily as needed for moderate constipation or mild constipation.   venlafaxine XR 75 MG 24 hr capsule Commonly known as: EFFEXOR-XR Take 75 mg by mouth daily with breakfast. Take with 150 mg capsule to equal 225 mg daily   venlafaxine XR 150 MG 24 hr capsule Commonly known as: EFFEXOR-XR Take 150 mg by mouth daily with breakfast. Take with 75 mg capsule to equal 225 mg daily   vitamin B-12 500 MCG tablet Commonly known as: CYANOCOBALAMIN Take 500 mcg by mouth every Monday.        Contact information for follow-up providers     [place], Christiane Ha, MD. Schedule an appointment as soon as possible for a visit in 1 week(s).   Specialty: Internal Medicine Contact information: [address] [place] [address] [phone]         [facility], [facility]. Schedule an appointment as soon as possible for a visit in 2 week(s).   Specialty: Specialist Contact information: [address] [place] [address] [phone]              Contact information for after-discharge care     [facility] .   Service: Skilled Nursing Contact information: [place] [address] [phone]                    Allergies  Allergen Reactions   Buspirone Hcl     lethargic/tired   Metformin And Related Diarrhea   Ozempic (0.25 Or 0.5 Mg-Dose) [Semaglutide(0.25 Or 0.'5mg'$ -Dos)]     Other reaction(s): nausea   Ultram [Tramadol] Nausea And Vomiting   Nickel Rash    Pt unable to wear jewelry made of nickel.   Pneumococcal Vaccines Swelling and Rash    At injection set    Consultations: Hospitalist Dr. Eliseo Squires discussed with orthopedics regarding nonsurgical management of humerus/pubic rami fracture   Procedures/Studies: DG Hip Unilat With Pelvis 2-3 Views Right  Result Date: 12/19/2021 CLINICAL DATA:  Fall, right hip pain EXAM: DG HIP (WITH OR WITHOUT PELVIS) 2-3V RIGHT COMPARISON:  None FINDINGS: Fracture through the right superior pubic ramus. No proximal femoral fracture. No subluxation or dislocation. SI joints symmetric and unremarkable. IMPRESSION: Fracture through the right superior pubic ramus. Electronically Signed   By: Rolm Baptise M.D.   On: 12/19/2021 22:34   DG Humerus Right  Result Date: 12/19/2021 CLINICAL DATA:  Right shoulder injury.  Fell. EXAM: RIGHT HUMERUS - 2+ VIEW; RIGHT SHOULDER - 2+ VIEW COMPARISON:  None Available. FINDINGS: Mild osteopenia. There is acute impacted transverse surgical neck fracture of the proximal humerus with comminution between fracture margins and along the greater tuberosity. The distal fragment is translated anteriorly about 1/3 of a shaft  width, with mild posterolateral distal-fragment angulation as well. No dislocation is seen. The [facility] joint is intact with circumferential spurring. Median sternotomy sutures are partially visible. The visualized right lung fields are clear. There is a chronic healed fracture deformity of the posterolateral right seventh rib. IMPRESSION: 1. Acute transverse surgical neck proximal right humeral fracture with impaction, comminution between fracture margins and along the greater tuberosity, and mild anterior translation of the distal fragment as well as mild posterolateral angulation. 2. Osteopenia and degenerative change. Electronically Signed   By: Telford Nab M.D.   On: 12/19/2021 20:54   DG Shoulder Right  Result Date: 12/19/2021 CLINICAL DATA:  Right shoulder injury.  Fell. EXAM: RIGHT HUMERUS - 2+ VIEW; RIGHT SHOULDER - 2+ VIEW COMPARISON:  None Available. FINDINGS: Mild osteopenia. There is acute impacted transverse surgical neck fracture of the proximal humerus with comminution between fracture margins and along the greater tuberosity. The distal fragment is translated anteriorly about 1/3 of a shaft width, with mild posterolateral distal-fragment angulation as well. No dislocation is seen. The [facility] joint is intact with circumferential spurring. Median sternotomy sutures are partially visible. The visualized right lung fields are clear. There is a chronic healed fracture deformity of the posterolateral right seventh rib. IMPRESSION: 1. Acute transverse surgical neck proximal right humeral fracture with impaction, comminution between fracture margins and along the greater tuberosity, and mild anterior translation of the distal fragment as well as mild posterolateral angulation. 2. Osteopenia and degenerative change. Electronically Signed   By: Telford Nab M.D.   On: 12/19/2021 20:54   CT Head Wo Contrast  Result Date: 12/19/2021 CLINICAL DATA:  Patient fell but is unsure of when EXAM: CT HEAD WITHOUT  CONTRAST TECHNIQUE: Contiguous axial images were obtained from the base of the skull through the vertex without intravenous contrast. RADIATION DOSE REDUCTION: This exam was performed according to the departmental dose-optimization program which includes automated exposure control, adjustment of the mA and/or kV according to patient size and/or use of iterative reconstruction technique. COMPARISON:  CT 01/13/2021 FINDINGS: Brain: No intracranial hemorrhage, mass effect, or evidence of acute infarct. No hydrocephalus. No extra-axial fluid collection. Generalized cerebral atrophy. Ill-defined hypoattenuation within the cerebral white matter is nonspecific but consistent with chronic small vessel ischemic disease. Chronic left cerebellar infarct. Vascular: No hyperdense vessel. Intracranial arterial calcification. Skull: No fracture or focal lesion. Sinuses/Orbits: No acute finding. Trace air-fluid level in the right maxillary sinus with some frothy secretions anteriorly. Small air-fluid level in the left maxillary sinus. Other: None. IMPRESSION: No acute intracranial abnormality. Age-related atrophy and chronic microvascular ischemic change. Air-fluid levels in the maxillary sinuses suggestive acute sinusitis. Electronically Signed   By: Placido Sou M.D.   On: 12/19/2021 20:39     Subjective: Patient seen examined bedside, resting calmly.  Lying in bed.  Daughter present.  Discharging to SNF today.  No other specific questions or concerns at this time.  Pain well controlled with Tylenol and lidocaine patch.  Denies headache, no chest pain, no shortness of breath, no abdominal pain.  No acute events overnight per nursing staff.  Discharge Exam: Vitals:   12/23/21 2015 12/24/21 0449  BP: (!) 135/48 (!) 165/56  Pulse: 69 70  Resp: 20 18  Temp: 98.3 F (36.8 C) 98.8 F (37.1 C)  SpO2: 96% 95%   Vitals:   12/22/21 1939 12/23/21 0404 12/23/21 2015 12/24/21 0449  BP: (!) 151/59 (!) 138/58 (!) 135/48  (!) 165/56  Pulse: 70 75 69 70  Resp: '16 14 20 18  '$ Temp: 99.6 F (37.6 C) 98.7 F (37.1 C) 98.3 F (36.8 C) 98.8 F (37.1 C)  TempSrc: Oral Oral Oral Oral  SpO2: 92% 94% 96% 95%  Weight:      Height:        Physical Exam: GEN: NAD, alert, pleasantly confused, elderly in appearance HEENT: NCAT, PERRL, EOMI, sclera clear, MMM, noted ecchymosis to bilateral inferior orbits PULM: CTAB w/o wheezes/crackles, normal respiratory effort, on room air CV: RRR w/o M/G/R GI: abd soft, NTND, NABS, no R/G/M MSK: Right upper extremity with sling in place, neurovascularly intact, moves all extremities independently, no peripheral edema NEURO: CN  II-XII intact, no focal deficits, sensation to light touch intact PSYCH: normal mood/affect Integumentary: dry/intact, no rashes or wounds    The results of significant diagnostics from this hospitalization (including imaging, microbiology, ancillary and laboratory) are listed below for reference.     Microbiology: No results found for this or any previous visit (from the past 240 hour(s)).   Labs: BNP (last 3 results) No results for input(s): "BNP" in the last 8760 hours. Basic Metabolic Panel: Recent Labs  Lab 12/19/21 2330 12/20/21 0500 12/21/21 0528  NA 135 136 133*  K 5.3* 4.9 4.5  CL 102 104 102  CO2 '24 24 25  '$ GLUCOSE 375* 255* 257*  BUN 37* 38* 40*  CREATININE 1.14* 1.07* 1.02*  CALCIUM 9.3 8.5* 8.4*   Liver Function Tests: Recent Labs  Lab 12/19/21 2330 12/20/21 0500  AST 31 24  ALT 30 24  ALKPHOS 71 62  BILITOT 0.9 0.5  PROT 6.7 6.3*  ALBUMIN 3.6 3.2*   No results for input(s): "LIPASE", "AMYLASE" in the last 168 hours. No results for input(s): "AMMONIA" in the last 168 hours. CBC: Recent Labs  Lab 12/19/21 2330 12/20/21 0337 12/21/21 0528  WBC 18.9* 12.5* 11.6*  HGB 13.6 10.3* 10.0*  HCT 41.5 31.3* 30.5*  MCV 90.2 91.3 90.5  PLT 200 144* 143*   Cardiac Enzymes: No results for input(s): "CKTOTAL", "CKMB",  "CKMBINDEX", "TROPONINI" in the last 168 hours. BNP: Invalid input(s): "POCBNP" CBG: Recent Labs  Lab 12/23/21 0752 12/23/21 1156 12/23/21 1750 12/23/21 2146 12/24/21 0723  GLUCAP 137* 223* 241* 178* 89   D-Dimer No results for input(s): "DDIMER" in the last 72 hours. Hgb A1c No results for input(s): "HGBA1C" in the last 72 hours. Lipid Profile No results for input(s): "CHOL", "HDL", "LDLCALC", "TRIG", "CHOLHDL", "LDLDIRECT" in the last 72 hours. Thyroid function studies No results for input(s): "TSH", "T4TOTAL", "T3FREE", "THYROIDAB" in the last 72 hours.  Invalid input(s): "FREET3" Anemia work up No results for input(s): "VITAMINB12", "FOLATE", "FERRITIN", "TIBC", "IRON", "RETICCTPCT" in the last 72 hours. Urinalysis    Component Value Date/Time   COLORURINE YELLOW 12/20/2021 0001   APPEARANCEUR HAZY (A) 12/20/2021 0001   LABSPEC 1.027 12/20/2021 0001   PHURINE 6.0 12/20/2021 0001   GLUCOSEU >=500 (A) 12/20/2021 0001   HGBUR NEGATIVE 12/20/2021 0001   BILIRUBINUR NEGATIVE 12/20/2021 0001   KETONESUR NEGATIVE 12/20/2021 0001   PROTEINUR NEGATIVE 12/20/2021 0001   UROBILINOGEN 1.0 10/09/2012 1630   NITRITE NEGATIVE 12/20/2021 0001   LEUKOCYTESUR TRACE (A) 12/20/2021 0001   Sepsis Labs Recent Labs  Lab 12/19/21 2330 12/20/21 0337 12/21/21 0528  WBC 18.9* 12.5* 11.6*   Microbiology No results found for this or any previous visit (from the past 240 hour(s)).   Time coordinating discharge: Over 30 minutes  SIGNED:   Donnamarie Poag [place] DO  [facility] 12/24/2021, 10:04 AM

## 2021-12-27 DIAGNOSIS — E875 Hyperkalemia: Secondary | ICD-10-CM | POA: Diagnosis not present

## 2021-12-27 DIAGNOSIS — I119 Hypertensive heart disease without heart failure: Secondary | ICD-10-CM | POA: Diagnosis not present

## 2021-12-27 DIAGNOSIS — S060X0D Concussion without loss of consciousness, subsequent encounter: Secondary | ICD-10-CM | POA: Diagnosis not present

## 2021-12-27 DIAGNOSIS — I639 Cerebral infarction, unspecified: Secondary | ICD-10-CM | POA: Diagnosis not present

## 2021-12-27 DIAGNOSIS — E1165 Type 2 diabetes mellitus with hyperglycemia: Secondary | ICD-10-CM | POA: Diagnosis not present

## 2021-12-27 DIAGNOSIS — M6281 Muscle weakness (generalized): Secondary | ICD-10-CM | POA: Diagnosis not present

## 2021-12-27 DIAGNOSIS — Z794 Long term (current) use of insulin: Secondary | ICD-10-CM | POA: Diagnosis not present

## 2021-12-27 DIAGNOSIS — E1121 Type 2 diabetes mellitus with diabetic nephropathy: Secondary | ICD-10-CM | POA: Diagnosis not present

## 2021-12-27 DIAGNOSIS — N1831 Chronic kidney disease, stage 3a: Secondary | ICD-10-CM | POA: Diagnosis not present

## 2021-12-27 DIAGNOSIS — Z9181 History of falling: Secondary | ICD-10-CM | POA: Diagnosis not present

## 2021-12-27 DIAGNOSIS — S42301D Unspecified fracture of shaft of humerus, right arm, subsequent encounter for fracture with routine healing: Secondary | ICD-10-CM | POA: Diagnosis not present

## 2021-12-28 DIAGNOSIS — S42301D Unspecified fracture of shaft of humerus, right arm, subsequent encounter for fracture with routine healing: Secondary | ICD-10-CM | POA: Diagnosis not present

## 2021-12-28 DIAGNOSIS — Z79899 Other long term (current) drug therapy: Secondary | ICD-10-CM | POA: Diagnosis not present

## 2021-12-28 DIAGNOSIS — E1165 Type 2 diabetes mellitus with hyperglycemia: Secondary | ICD-10-CM | POA: Diagnosis not present

## 2021-12-28 DIAGNOSIS — I4891 Unspecified atrial fibrillation: Secondary | ICD-10-CM | POA: Diagnosis not present

## 2021-12-29 DIAGNOSIS — D649 Anemia, unspecified: Secondary | ICD-10-CM | POA: Diagnosis not present

## 2021-12-29 DIAGNOSIS — Z79899 Other long term (current) drug therapy: Secondary | ICD-10-CM | POA: Diagnosis not present

## 2021-12-29 DIAGNOSIS — N182 Chronic kidney disease, stage 2 (mild): Secondary | ICD-10-CM | POA: Diagnosis not present

## 2021-12-31 DIAGNOSIS — R52 Pain, unspecified: Secondary | ICD-10-CM | POA: Diagnosis not present

## 2021-12-31 DIAGNOSIS — Z79899 Other long term (current) drug therapy: Secondary | ICD-10-CM | POA: Diagnosis not present

## 2021-12-31 DIAGNOSIS — S42301D Unspecified fracture of shaft of humerus, right arm, subsequent encounter for fracture with routine healing: Secondary | ICD-10-CM | POA: Diagnosis not present

## 2022-01-04 DIAGNOSIS — B3749 Other urogenital candidiasis: Secondary | ICD-10-CM | POA: Diagnosis not present

## 2022-01-04 DIAGNOSIS — Z79899 Other long term (current) drug therapy: Secondary | ICD-10-CM | POA: Diagnosis not present

## 2022-01-04 DIAGNOSIS — B3731 Acute candidiasis of vulva and vagina: Secondary | ICD-10-CM | POA: Diagnosis not present

## 2022-01-06 DIAGNOSIS — Z79899 Other long term (current) drug therapy: Secondary | ICD-10-CM | POA: Diagnosis not present

## 2022-01-06 DIAGNOSIS — K5900 Constipation, unspecified: Secondary | ICD-10-CM | POA: Diagnosis not present

## 2022-01-10 DIAGNOSIS — S42214A Unspecified nondisplaced fracture of surgical neck of right humerus, initial encounter for closed fracture: Secondary | ICD-10-CM | POA: Diagnosis not present

## 2022-01-10 DIAGNOSIS — S32591A Other specified fracture of right pubis, initial encounter for closed fracture: Secondary | ICD-10-CM | POA: Diagnosis not present

## 2022-01-14 DIAGNOSIS — Z79899 Other long term (current) drug therapy: Secondary | ICD-10-CM | POA: Diagnosis not present

## 2022-01-14 DIAGNOSIS — K5900 Constipation, unspecified: Secondary | ICD-10-CM | POA: Diagnosis not present

## 2022-01-23 DIAGNOSIS — E1165 Type 2 diabetes mellitus with hyperglycemia: Secondary | ICD-10-CM | POA: Diagnosis not present

## 2022-01-24 DIAGNOSIS — S32591A Other specified fracture of right pubis, initial encounter for closed fracture: Secondary | ICD-10-CM | POA: Diagnosis not present

## 2022-01-24 DIAGNOSIS — S42214A Unspecified nondisplaced fracture of surgical neck of right humerus, initial encounter for closed fracture: Secondary | ICD-10-CM | POA: Diagnosis not present

## 2022-01-27 DIAGNOSIS — Z794 Long term (current) use of insulin: Secondary | ICD-10-CM | POA: Diagnosis not present

## 2022-01-27 DIAGNOSIS — Z9181 History of falling: Secondary | ICD-10-CM | POA: Diagnosis not present

## 2022-01-27 DIAGNOSIS — N1831 Chronic kidney disease, stage 3a: Secondary | ICD-10-CM | POA: Diagnosis not present

## 2022-01-27 DIAGNOSIS — E875 Hyperkalemia: Secondary | ICD-10-CM | POA: Diagnosis not present

## 2022-01-27 DIAGNOSIS — I639 Cerebral infarction, unspecified: Secondary | ICD-10-CM | POA: Diagnosis not present

## 2022-01-27 DIAGNOSIS — S42301D Unspecified fracture of shaft of humerus, right arm, subsequent encounter for fracture with routine healing: Secondary | ICD-10-CM | POA: Diagnosis not present

## 2022-01-27 DIAGNOSIS — I119 Hypertensive heart disease without heart failure: Secondary | ICD-10-CM | POA: Diagnosis not present

## 2022-01-27 DIAGNOSIS — E1165 Type 2 diabetes mellitus with hyperglycemia: Secondary | ICD-10-CM | POA: Diagnosis not present

## 2022-01-27 DIAGNOSIS — M6281 Muscle weakness (generalized): Secondary | ICD-10-CM | POA: Diagnosis not present

## 2022-01-27 DIAGNOSIS — S060X0D Concussion without loss of consciousness, subsequent encounter: Secondary | ICD-10-CM | POA: Diagnosis not present

## 2022-01-27 DIAGNOSIS — E1121 Type 2 diabetes mellitus with diabetic nephropathy: Secondary | ICD-10-CM | POA: Diagnosis not present

## 2022-01-30 DIAGNOSIS — I1310 Hypertensive heart and chronic kidney disease without heart failure, with stage 1 through stage 4 chronic kidney disease, or unspecified chronic kidney disease: Secondary | ICD-10-CM | POA: Diagnosis not present

## 2022-01-30 DIAGNOSIS — K219 Gastro-esophageal reflux disease without esophagitis: Secondary | ICD-10-CM | POA: Diagnosis not present

## 2022-01-30 DIAGNOSIS — N1831 Chronic kidney disease, stage 3a: Secondary | ICD-10-CM | POA: Diagnosis not present

## 2022-01-30 DIAGNOSIS — Z86718 Personal history of other venous thrombosis and embolism: Secondary | ICD-10-CM | POA: Diagnosis not present

## 2022-01-30 DIAGNOSIS — F32A Depression, unspecified: Secondary | ICD-10-CM | POA: Diagnosis not present

## 2022-01-30 DIAGNOSIS — Z794 Long term (current) use of insulin: Secondary | ICD-10-CM | POA: Diagnosis not present

## 2022-01-30 DIAGNOSIS — E1122 Type 2 diabetes mellitus with diabetic chronic kidney disease: Secondary | ICD-10-CM | POA: Diagnosis not present

## 2022-01-30 DIAGNOSIS — E1165 Type 2 diabetes mellitus with hyperglycemia: Secondary | ICD-10-CM | POA: Diagnosis not present

## 2022-01-30 DIAGNOSIS — S0511XD Contusion of eyeball and orbital tissues, right eye, subsequent encounter: Secondary | ICD-10-CM | POA: Diagnosis not present

## 2022-01-30 DIAGNOSIS — M1990 Unspecified osteoarthritis, unspecified site: Secondary | ICD-10-CM | POA: Diagnosis not present

## 2022-01-30 DIAGNOSIS — I2510 Atherosclerotic heart disease of native coronary artery without angina pectoris: Secondary | ICD-10-CM | POA: Diagnosis not present

## 2022-01-30 DIAGNOSIS — Z952 Presence of prosthetic heart valve: Secondary | ICD-10-CM | POA: Diagnosis not present

## 2022-01-30 DIAGNOSIS — Z7901 Long term (current) use of anticoagulants: Secondary | ICD-10-CM | POA: Diagnosis not present

## 2022-01-30 DIAGNOSIS — I4891 Unspecified atrial fibrillation: Secondary | ICD-10-CM | POA: Diagnosis not present

## 2022-01-30 DIAGNOSIS — I6522 Occlusion and stenosis of left carotid artery: Secondary | ICD-10-CM | POA: Diagnosis not present

## 2022-01-30 DIAGNOSIS — K5909 Other constipation: Secondary | ICD-10-CM | POA: Diagnosis not present

## 2022-01-30 DIAGNOSIS — E1140 Type 2 diabetes mellitus with diabetic neuropathy, unspecified: Secondary | ICD-10-CM | POA: Diagnosis not present

## 2022-01-30 DIAGNOSIS — E875 Hyperkalemia: Secondary | ICD-10-CM | POA: Diagnosis not present

## 2022-01-30 DIAGNOSIS — S32501D Unspecified fracture of right pubis, subsequent encounter for fracture with routine healing: Secondary | ICD-10-CM | POA: Diagnosis not present

## 2022-01-30 DIAGNOSIS — Z9889 Other specified postprocedural states: Secondary | ICD-10-CM | POA: Diagnosis not present

## 2022-01-30 DIAGNOSIS — I69351 Hemiplegia and hemiparesis following cerebral infarction affecting right dominant side: Secondary | ICD-10-CM | POA: Diagnosis not present

## 2022-01-30 DIAGNOSIS — Z556 Problems related to health literacy: Secondary | ICD-10-CM | POA: Diagnosis not present

## 2022-01-30 DIAGNOSIS — S42301D Unspecified fracture of shaft of humerus, right arm, subsequent encounter for fracture with routine healing: Secondary | ICD-10-CM | POA: Diagnosis not present

## 2022-01-30 DIAGNOSIS — Z853 Personal history of malignant neoplasm of breast: Secondary | ICD-10-CM | POA: Diagnosis not present

## 2022-01-31 DIAGNOSIS — S32511D Fracture of superior rim of right pubis, subsequent encounter for fracture with routine healing: Secondary | ICD-10-CM | POA: Diagnosis not present

## 2022-01-31 DIAGNOSIS — S42211D Unspecified displaced fracture of surgical neck of right humerus, subsequent encounter for fracture with routine healing: Secondary | ICD-10-CM | POA: Diagnosis not present

## 2022-02-07 DIAGNOSIS — K5909 Other constipation: Secondary | ICD-10-CM | POA: Diagnosis not present

## 2022-02-07 DIAGNOSIS — Z7901 Long term (current) use of anticoagulants: Secondary | ICD-10-CM | POA: Diagnosis not present

## 2022-02-07 DIAGNOSIS — Z794 Long term (current) use of insulin: Secondary | ICD-10-CM | POA: Diagnosis not present

## 2022-02-07 DIAGNOSIS — S42301D Unspecified fracture of shaft of humerus, right arm, subsequent encounter for fracture with routine healing: Secondary | ICD-10-CM | POA: Diagnosis not present

## 2022-02-07 DIAGNOSIS — M1990 Unspecified osteoarthritis, unspecified site: Secondary | ICD-10-CM | POA: Diagnosis not present

## 2022-02-07 DIAGNOSIS — E1122 Type 2 diabetes mellitus with diabetic chronic kidney disease: Secondary | ICD-10-CM | POA: Diagnosis not present

## 2022-02-07 DIAGNOSIS — I6522 Occlusion and stenosis of left carotid artery: Secondary | ICD-10-CM | POA: Diagnosis not present

## 2022-02-07 DIAGNOSIS — K219 Gastro-esophageal reflux disease without esophagitis: Secondary | ICD-10-CM | POA: Diagnosis not present

## 2022-02-07 DIAGNOSIS — E1165 Type 2 diabetes mellitus with hyperglycemia: Secondary | ICD-10-CM | POA: Diagnosis not present

## 2022-02-07 DIAGNOSIS — I2510 Atherosclerotic heart disease of native coronary artery without angina pectoris: Secondary | ICD-10-CM | POA: Diagnosis not present

## 2022-02-07 DIAGNOSIS — I69351 Hemiplegia and hemiparesis following cerebral infarction affecting right dominant side: Secondary | ICD-10-CM | POA: Diagnosis not present

## 2022-02-07 DIAGNOSIS — Z556 Problems related to health literacy: Secondary | ICD-10-CM | POA: Diagnosis not present

## 2022-02-07 DIAGNOSIS — S0511XD Contusion of eyeball and orbital tissues, right eye, subsequent encounter: Secondary | ICD-10-CM | POA: Diagnosis not present

## 2022-02-07 DIAGNOSIS — Z86718 Personal history of other venous thrombosis and embolism: Secondary | ICD-10-CM | POA: Diagnosis not present

## 2022-02-07 DIAGNOSIS — I1310 Hypertensive heart and chronic kidney disease without heart failure, with stage 1 through stage 4 chronic kidney disease, or unspecified chronic kidney disease: Secondary | ICD-10-CM | POA: Diagnosis not present

## 2022-02-07 DIAGNOSIS — S32501D Unspecified fracture of right pubis, subsequent encounter for fracture with routine healing: Secondary | ICD-10-CM | POA: Diagnosis not present

## 2022-02-07 DIAGNOSIS — F32A Depression, unspecified: Secondary | ICD-10-CM | POA: Diagnosis not present

## 2022-02-07 DIAGNOSIS — Z853 Personal history of malignant neoplasm of breast: Secondary | ICD-10-CM | POA: Diagnosis not present

## 2022-02-07 DIAGNOSIS — Z9889 Other specified postprocedural states: Secondary | ICD-10-CM | POA: Diagnosis not present

## 2022-02-07 DIAGNOSIS — I4891 Unspecified atrial fibrillation: Secondary | ICD-10-CM | POA: Diagnosis not present

## 2022-02-07 DIAGNOSIS — N1831 Chronic kidney disease, stage 3a: Secondary | ICD-10-CM | POA: Diagnosis not present

## 2022-02-07 DIAGNOSIS — E875 Hyperkalemia: Secondary | ICD-10-CM | POA: Diagnosis not present

## 2022-02-07 DIAGNOSIS — Z952 Presence of prosthetic heart valve: Secondary | ICD-10-CM | POA: Diagnosis not present

## 2022-02-07 DIAGNOSIS — E1140 Type 2 diabetes mellitus with diabetic neuropathy, unspecified: Secondary | ICD-10-CM | POA: Diagnosis not present

## 2022-02-10 DIAGNOSIS — Z952 Presence of prosthetic heart valve: Secondary | ICD-10-CM | POA: Diagnosis not present

## 2022-02-10 DIAGNOSIS — E875 Hyperkalemia: Secondary | ICD-10-CM | POA: Diagnosis not present

## 2022-02-10 DIAGNOSIS — S32501D Unspecified fracture of right pubis, subsequent encounter for fracture with routine healing: Secondary | ICD-10-CM | POA: Diagnosis not present

## 2022-02-10 DIAGNOSIS — Z86718 Personal history of other venous thrombosis and embolism: Secondary | ICD-10-CM | POA: Diagnosis not present

## 2022-02-10 DIAGNOSIS — Z556 Problems related to health literacy: Secondary | ICD-10-CM | POA: Diagnosis not present

## 2022-02-10 DIAGNOSIS — Z853 Personal history of malignant neoplasm of breast: Secondary | ICD-10-CM | POA: Diagnosis not present

## 2022-02-10 DIAGNOSIS — K5909 Other constipation: Secondary | ICD-10-CM | POA: Diagnosis not present

## 2022-02-10 DIAGNOSIS — I4891 Unspecified atrial fibrillation: Secondary | ICD-10-CM | POA: Diagnosis not present

## 2022-02-10 DIAGNOSIS — S0511XD Contusion of eyeball and orbital tissues, right eye, subsequent encounter: Secondary | ICD-10-CM | POA: Diagnosis not present

## 2022-02-10 DIAGNOSIS — I1310 Hypertensive heart and chronic kidney disease without heart failure, with stage 1 through stage 4 chronic kidney disease, or unspecified chronic kidney disease: Secondary | ICD-10-CM | POA: Diagnosis not present

## 2022-02-10 DIAGNOSIS — K219 Gastro-esophageal reflux disease without esophagitis: Secondary | ICD-10-CM | POA: Diagnosis not present

## 2022-02-10 DIAGNOSIS — I69351 Hemiplegia and hemiparesis following cerebral infarction affecting right dominant side: Secondary | ICD-10-CM | POA: Diagnosis not present

## 2022-02-10 DIAGNOSIS — M1990 Unspecified osteoarthritis, unspecified site: Secondary | ICD-10-CM | POA: Diagnosis not present

## 2022-02-10 DIAGNOSIS — I6522 Occlusion and stenosis of left carotid artery: Secondary | ICD-10-CM | POA: Diagnosis not present

## 2022-02-10 DIAGNOSIS — E1122 Type 2 diabetes mellitus with diabetic chronic kidney disease: Secondary | ICD-10-CM | POA: Diagnosis not present

## 2022-02-10 DIAGNOSIS — Z9889 Other specified postprocedural states: Secondary | ICD-10-CM | POA: Diagnosis not present

## 2022-02-10 DIAGNOSIS — N1831 Chronic kidney disease, stage 3a: Secondary | ICD-10-CM | POA: Diagnosis not present

## 2022-02-10 DIAGNOSIS — Z794 Long term (current) use of insulin: Secondary | ICD-10-CM | POA: Diagnosis not present

## 2022-02-10 DIAGNOSIS — E1165 Type 2 diabetes mellitus with hyperglycemia: Secondary | ICD-10-CM | POA: Diagnosis not present

## 2022-02-10 DIAGNOSIS — Z7901 Long term (current) use of anticoagulants: Secondary | ICD-10-CM | POA: Diagnosis not present

## 2022-02-10 DIAGNOSIS — S42301D Unspecified fracture of shaft of humerus, right arm, subsequent encounter for fracture with routine healing: Secondary | ICD-10-CM | POA: Diagnosis not present

## 2022-02-10 DIAGNOSIS — F32A Depression, unspecified: Secondary | ICD-10-CM | POA: Diagnosis not present

## 2022-02-10 DIAGNOSIS — E1140 Type 2 diabetes mellitus with diabetic neuropathy, unspecified: Secondary | ICD-10-CM | POA: Diagnosis not present

## 2022-02-10 DIAGNOSIS — I2510 Atherosclerotic heart disease of native coronary artery without angina pectoris: Secondary | ICD-10-CM | POA: Diagnosis not present

## 2022-02-14 DIAGNOSIS — I1310 Hypertensive heart and chronic kidney disease without heart failure, with stage 1 through stage 4 chronic kidney disease, or unspecified chronic kidney disease: Secondary | ICD-10-CM | POA: Diagnosis not present

## 2022-02-14 DIAGNOSIS — K5909 Other constipation: Secondary | ICD-10-CM | POA: Diagnosis not present

## 2022-02-14 DIAGNOSIS — S42301D Unspecified fracture of shaft of humerus, right arm, subsequent encounter for fracture with routine healing: Secondary | ICD-10-CM | POA: Diagnosis not present

## 2022-02-14 DIAGNOSIS — I69351 Hemiplegia and hemiparesis following cerebral infarction affecting right dominant side: Secondary | ICD-10-CM | POA: Diagnosis not present

## 2022-02-14 DIAGNOSIS — Z952 Presence of prosthetic heart valve: Secondary | ICD-10-CM | POA: Diagnosis not present

## 2022-02-14 DIAGNOSIS — E1165 Type 2 diabetes mellitus with hyperglycemia: Secondary | ICD-10-CM | POA: Diagnosis not present

## 2022-02-14 DIAGNOSIS — E1140 Type 2 diabetes mellitus with diabetic neuropathy, unspecified: Secondary | ICD-10-CM | POA: Diagnosis not present

## 2022-02-14 DIAGNOSIS — I6522 Occlusion and stenosis of left carotid artery: Secondary | ICD-10-CM | POA: Diagnosis not present

## 2022-02-14 DIAGNOSIS — Z9889 Other specified postprocedural states: Secondary | ICD-10-CM | POA: Diagnosis not present

## 2022-02-14 DIAGNOSIS — Z853 Personal history of malignant neoplasm of breast: Secondary | ICD-10-CM | POA: Diagnosis not present

## 2022-02-14 DIAGNOSIS — I4891 Unspecified atrial fibrillation: Secondary | ICD-10-CM | POA: Diagnosis not present

## 2022-02-14 DIAGNOSIS — N1831 Chronic kidney disease, stage 3a: Secondary | ICD-10-CM | POA: Diagnosis not present

## 2022-02-14 DIAGNOSIS — E875 Hyperkalemia: Secondary | ICD-10-CM | POA: Diagnosis not present

## 2022-02-14 DIAGNOSIS — E1122 Type 2 diabetes mellitus with diabetic chronic kidney disease: Secondary | ICD-10-CM | POA: Diagnosis not present

## 2022-02-14 DIAGNOSIS — Z556 Problems related to health literacy: Secondary | ICD-10-CM | POA: Diagnosis not present

## 2022-02-14 DIAGNOSIS — S0511XD Contusion of eyeball and orbital tissues, right eye, subsequent encounter: Secondary | ICD-10-CM | POA: Diagnosis not present

## 2022-02-14 DIAGNOSIS — S32501D Unspecified fracture of right pubis, subsequent encounter for fracture with routine healing: Secondary | ICD-10-CM | POA: Diagnosis not present

## 2022-02-14 DIAGNOSIS — Z86718 Personal history of other venous thrombosis and embolism: Secondary | ICD-10-CM | POA: Diagnosis not present

## 2022-02-14 DIAGNOSIS — I2510 Atherosclerotic heart disease of native coronary artery without angina pectoris: Secondary | ICD-10-CM | POA: Diagnosis not present

## 2022-02-14 DIAGNOSIS — Z794 Long term (current) use of insulin: Secondary | ICD-10-CM | POA: Diagnosis not present

## 2022-02-14 DIAGNOSIS — F32A Depression, unspecified: Secondary | ICD-10-CM | POA: Diagnosis not present

## 2022-02-14 DIAGNOSIS — K219 Gastro-esophageal reflux disease without esophagitis: Secondary | ICD-10-CM | POA: Diagnosis not present

## 2022-02-14 DIAGNOSIS — Z7901 Long term (current) use of anticoagulants: Secondary | ICD-10-CM | POA: Diagnosis not present

## 2022-02-14 DIAGNOSIS — M1990 Unspecified osteoarthritis, unspecified site: Secondary | ICD-10-CM | POA: Diagnosis not present

## 2022-02-21 DIAGNOSIS — E1122 Type 2 diabetes mellitus with diabetic chronic kidney disease: Secondary | ICD-10-CM | POA: Diagnosis not present

## 2022-02-21 DIAGNOSIS — Z7901 Long term (current) use of anticoagulants: Secondary | ICD-10-CM | POA: Diagnosis not present

## 2022-02-21 DIAGNOSIS — S42301D Unspecified fracture of shaft of humerus, right arm, subsequent encounter for fracture with routine healing: Secondary | ICD-10-CM | POA: Diagnosis not present

## 2022-02-21 DIAGNOSIS — F32A Depression, unspecified: Secondary | ICD-10-CM | POA: Diagnosis not present

## 2022-02-21 DIAGNOSIS — Z952 Presence of prosthetic heart valve: Secondary | ICD-10-CM | POA: Diagnosis not present

## 2022-02-21 DIAGNOSIS — E1165 Type 2 diabetes mellitus with hyperglycemia: Secondary | ICD-10-CM | POA: Diagnosis not present

## 2022-02-21 DIAGNOSIS — I4891 Unspecified atrial fibrillation: Secondary | ICD-10-CM | POA: Diagnosis not present

## 2022-02-21 DIAGNOSIS — K5909 Other constipation: Secondary | ICD-10-CM | POA: Diagnosis not present

## 2022-02-21 DIAGNOSIS — I69351 Hemiplegia and hemiparesis following cerebral infarction affecting right dominant side: Secondary | ICD-10-CM | POA: Diagnosis not present

## 2022-02-21 DIAGNOSIS — I1310 Hypertensive heart and chronic kidney disease without heart failure, with stage 1 through stage 4 chronic kidney disease, or unspecified chronic kidney disease: Secondary | ICD-10-CM | POA: Diagnosis not present

## 2022-02-21 DIAGNOSIS — N1831 Chronic kidney disease, stage 3a: Secondary | ICD-10-CM | POA: Diagnosis not present

## 2022-02-21 DIAGNOSIS — Z794 Long term (current) use of insulin: Secondary | ICD-10-CM | POA: Diagnosis not present

## 2022-02-21 DIAGNOSIS — Z853 Personal history of malignant neoplasm of breast: Secondary | ICD-10-CM | POA: Diagnosis not present

## 2022-02-21 DIAGNOSIS — S32501D Unspecified fracture of right pubis, subsequent encounter for fracture with routine healing: Secondary | ICD-10-CM | POA: Diagnosis not present

## 2022-02-21 DIAGNOSIS — S0511XD Contusion of eyeball and orbital tissues, right eye, subsequent encounter: Secondary | ICD-10-CM | POA: Diagnosis not present

## 2022-02-21 DIAGNOSIS — Z556 Problems related to health literacy: Secondary | ICD-10-CM | POA: Diagnosis not present

## 2022-02-21 DIAGNOSIS — I2510 Atherosclerotic heart disease of native coronary artery without angina pectoris: Secondary | ICD-10-CM | POA: Diagnosis not present

## 2022-02-21 DIAGNOSIS — E875 Hyperkalemia: Secondary | ICD-10-CM | POA: Diagnosis not present

## 2022-02-21 DIAGNOSIS — K219 Gastro-esophageal reflux disease without esophagitis: Secondary | ICD-10-CM | POA: Diagnosis not present

## 2022-02-21 DIAGNOSIS — Z9889 Other specified postprocedural states: Secondary | ICD-10-CM | POA: Diagnosis not present

## 2022-02-21 DIAGNOSIS — Z86718 Personal history of other venous thrombosis and embolism: Secondary | ICD-10-CM | POA: Diagnosis not present

## 2022-02-21 DIAGNOSIS — I6522 Occlusion and stenosis of left carotid artery: Secondary | ICD-10-CM | POA: Diagnosis not present

## 2022-02-21 DIAGNOSIS — E1140 Type 2 diabetes mellitus with diabetic neuropathy, unspecified: Secondary | ICD-10-CM | POA: Diagnosis not present

## 2022-02-21 DIAGNOSIS — M1990 Unspecified osteoarthritis, unspecified site: Secondary | ICD-10-CM | POA: Diagnosis not present

## 2022-02-22 DIAGNOSIS — E1122 Type 2 diabetes mellitus with diabetic chronic kidney disease: Secondary | ICD-10-CM | POA: Diagnosis not present

## 2022-02-22 DIAGNOSIS — K5909 Other constipation: Secondary | ICD-10-CM | POA: Diagnosis not present

## 2022-02-22 DIAGNOSIS — E1140 Type 2 diabetes mellitus with diabetic neuropathy, unspecified: Secondary | ICD-10-CM | POA: Diagnosis not present

## 2022-02-22 DIAGNOSIS — Z853 Personal history of malignant neoplasm of breast: Secondary | ICD-10-CM | POA: Diagnosis not present

## 2022-02-22 DIAGNOSIS — Z9889 Other specified postprocedural states: Secondary | ICD-10-CM | POA: Diagnosis not present

## 2022-02-22 DIAGNOSIS — Z556 Problems related to health literacy: Secondary | ICD-10-CM | POA: Diagnosis not present

## 2022-02-22 DIAGNOSIS — I4891 Unspecified atrial fibrillation: Secondary | ICD-10-CM | POA: Diagnosis not present

## 2022-02-22 DIAGNOSIS — S32501D Unspecified fracture of right pubis, subsequent encounter for fracture with routine healing: Secondary | ICD-10-CM | POA: Diagnosis not present

## 2022-02-22 DIAGNOSIS — I2510 Atherosclerotic heart disease of native coronary artery without angina pectoris: Secondary | ICD-10-CM | POA: Diagnosis not present

## 2022-02-22 DIAGNOSIS — N1831 Chronic kidney disease, stage 3a: Secondary | ICD-10-CM | POA: Diagnosis not present

## 2022-02-22 DIAGNOSIS — I6522 Occlusion and stenosis of left carotid artery: Secondary | ICD-10-CM | POA: Diagnosis not present

## 2022-02-22 DIAGNOSIS — Z794 Long term (current) use of insulin: Secondary | ICD-10-CM | POA: Diagnosis not present

## 2022-02-22 DIAGNOSIS — Z7901 Long term (current) use of anticoagulants: Secondary | ICD-10-CM | POA: Diagnosis not present

## 2022-02-22 DIAGNOSIS — M1990 Unspecified osteoarthritis, unspecified site: Secondary | ICD-10-CM | POA: Diagnosis not present

## 2022-02-22 DIAGNOSIS — S0511XD Contusion of eyeball and orbital tissues, right eye, subsequent encounter: Secondary | ICD-10-CM | POA: Diagnosis not present

## 2022-02-22 DIAGNOSIS — I1310 Hypertensive heart and chronic kidney disease without heart failure, with stage 1 through stage 4 chronic kidney disease, or unspecified chronic kidney disease: Secondary | ICD-10-CM | POA: Diagnosis not present

## 2022-02-22 DIAGNOSIS — Z952 Presence of prosthetic heart valve: Secondary | ICD-10-CM | POA: Diagnosis not present

## 2022-02-22 DIAGNOSIS — F32A Depression, unspecified: Secondary | ICD-10-CM | POA: Diagnosis not present

## 2022-02-22 DIAGNOSIS — E875 Hyperkalemia: Secondary | ICD-10-CM | POA: Diagnosis not present

## 2022-02-22 DIAGNOSIS — Z86718 Personal history of other venous thrombosis and embolism: Secondary | ICD-10-CM | POA: Diagnosis not present

## 2022-02-22 DIAGNOSIS — I69351 Hemiplegia and hemiparesis following cerebral infarction affecting right dominant side: Secondary | ICD-10-CM | POA: Diagnosis not present

## 2022-02-22 DIAGNOSIS — K219 Gastro-esophageal reflux disease without esophagitis: Secondary | ICD-10-CM | POA: Diagnosis not present

## 2022-02-22 DIAGNOSIS — S42301D Unspecified fracture of shaft of humerus, right arm, subsequent encounter for fracture with routine healing: Secondary | ICD-10-CM | POA: Diagnosis not present

## 2022-02-22 DIAGNOSIS — E1165 Type 2 diabetes mellitus with hyperglycemia: Secondary | ICD-10-CM | POA: Diagnosis not present

## 2022-02-23 DIAGNOSIS — E1165 Type 2 diabetes mellitus with hyperglycemia: Secondary | ICD-10-CM | POA: Diagnosis not present

## 2022-02-23 DIAGNOSIS — K219 Gastro-esophageal reflux disease without esophagitis: Secondary | ICD-10-CM | POA: Diagnosis not present

## 2022-02-23 DIAGNOSIS — F32A Depression, unspecified: Secondary | ICD-10-CM | POA: Diagnosis not present

## 2022-02-23 DIAGNOSIS — I1310 Hypertensive heart and chronic kidney disease without heart failure, with stage 1 through stage 4 chronic kidney disease, or unspecified chronic kidney disease: Secondary | ICD-10-CM | POA: Diagnosis not present

## 2022-02-23 DIAGNOSIS — K5909 Other constipation: Secondary | ICD-10-CM | POA: Diagnosis not present

## 2022-02-23 DIAGNOSIS — Z86718 Personal history of other venous thrombosis and embolism: Secondary | ICD-10-CM | POA: Diagnosis not present

## 2022-02-23 DIAGNOSIS — Z794 Long term (current) use of insulin: Secondary | ICD-10-CM | POA: Diagnosis not present

## 2022-02-23 DIAGNOSIS — E1122 Type 2 diabetes mellitus with diabetic chronic kidney disease: Secondary | ICD-10-CM | POA: Diagnosis not present

## 2022-02-23 DIAGNOSIS — I2510 Atherosclerotic heart disease of native coronary artery without angina pectoris: Secondary | ICD-10-CM | POA: Diagnosis not present

## 2022-02-23 DIAGNOSIS — S42301D Unspecified fracture of shaft of humerus, right arm, subsequent encounter for fracture with routine healing: Secondary | ICD-10-CM | POA: Diagnosis not present

## 2022-02-23 DIAGNOSIS — I6522 Occlusion and stenosis of left carotid artery: Secondary | ICD-10-CM | POA: Diagnosis not present

## 2022-02-23 DIAGNOSIS — I4891 Unspecified atrial fibrillation: Secondary | ICD-10-CM | POA: Diagnosis not present

## 2022-02-23 DIAGNOSIS — Z7901 Long term (current) use of anticoagulants: Secondary | ICD-10-CM | POA: Diagnosis not present

## 2022-02-23 DIAGNOSIS — Z853 Personal history of malignant neoplasm of breast: Secondary | ICD-10-CM | POA: Diagnosis not present

## 2022-02-23 DIAGNOSIS — S32501D Unspecified fracture of right pubis, subsequent encounter for fracture with routine healing: Secondary | ICD-10-CM | POA: Diagnosis not present

## 2022-02-23 DIAGNOSIS — I69351 Hemiplegia and hemiparesis following cerebral infarction affecting right dominant side: Secondary | ICD-10-CM | POA: Diagnosis not present

## 2022-02-23 DIAGNOSIS — Z952 Presence of prosthetic heart valve: Secondary | ICD-10-CM | POA: Diagnosis not present

## 2022-02-23 DIAGNOSIS — N1831 Chronic kidney disease, stage 3a: Secondary | ICD-10-CM | POA: Diagnosis not present

## 2022-02-23 DIAGNOSIS — Z556 Problems related to health literacy: Secondary | ICD-10-CM | POA: Diagnosis not present

## 2022-02-23 DIAGNOSIS — E1140 Type 2 diabetes mellitus with diabetic neuropathy, unspecified: Secondary | ICD-10-CM | POA: Diagnosis not present

## 2022-02-23 DIAGNOSIS — M1990 Unspecified osteoarthritis, unspecified site: Secondary | ICD-10-CM | POA: Diagnosis not present

## 2022-02-23 DIAGNOSIS — E875 Hyperkalemia: Secondary | ICD-10-CM | POA: Diagnosis not present

## 2022-02-23 DIAGNOSIS — Z9889 Other specified postprocedural states: Secondary | ICD-10-CM | POA: Diagnosis not present

## 2022-02-23 DIAGNOSIS — S0511XD Contusion of eyeball and orbital tissues, right eye, subsequent encounter: Secondary | ICD-10-CM | POA: Diagnosis not present

## 2022-02-24 DIAGNOSIS — E1121 Type 2 diabetes mellitus with diabetic nephropathy: Secondary | ICD-10-CM | POA: Diagnosis not present

## 2022-02-24 DIAGNOSIS — I129 Hypertensive chronic kidney disease with stage 1 through stage 4 chronic kidney disease, or unspecified chronic kidney disease: Secondary | ICD-10-CM | POA: Diagnosis not present

## 2022-02-24 DIAGNOSIS — Z7409 Other reduced mobility: Secondary | ICD-10-CM | POA: Diagnosis not present

## 2022-02-24 DIAGNOSIS — D649 Anemia, unspecified: Secondary | ICD-10-CM | POA: Diagnosis not present

## 2022-02-24 DIAGNOSIS — L0390 Cellulitis, unspecified: Secondary | ICD-10-CM | POA: Diagnosis not present

## 2022-02-24 DIAGNOSIS — G3184 Mild cognitive impairment, so stated: Secondary | ICD-10-CM | POA: Diagnosis not present

## 2022-02-25 DIAGNOSIS — I4891 Unspecified atrial fibrillation: Secondary | ICD-10-CM | POA: Diagnosis not present

## 2022-02-25 DIAGNOSIS — E1165 Type 2 diabetes mellitus with hyperglycemia: Secondary | ICD-10-CM | POA: Diagnosis not present

## 2022-02-25 DIAGNOSIS — K5909 Other constipation: Secondary | ICD-10-CM | POA: Diagnosis not present

## 2022-02-25 DIAGNOSIS — S42301D Unspecified fracture of shaft of humerus, right arm, subsequent encounter for fracture with routine healing: Secondary | ICD-10-CM | POA: Diagnosis not present

## 2022-02-25 DIAGNOSIS — Z853 Personal history of malignant neoplasm of breast: Secondary | ICD-10-CM | POA: Diagnosis not present

## 2022-02-25 DIAGNOSIS — Z9889 Other specified postprocedural states: Secondary | ICD-10-CM | POA: Diagnosis not present

## 2022-02-25 DIAGNOSIS — S32501D Unspecified fracture of right pubis, subsequent encounter for fracture with routine healing: Secondary | ICD-10-CM | POA: Diagnosis not present

## 2022-02-25 DIAGNOSIS — E1122 Type 2 diabetes mellitus with diabetic chronic kidney disease: Secondary | ICD-10-CM | POA: Diagnosis not present

## 2022-02-25 DIAGNOSIS — Z556 Problems related to health literacy: Secondary | ICD-10-CM | POA: Diagnosis not present

## 2022-02-25 DIAGNOSIS — Z794 Long term (current) use of insulin: Secondary | ICD-10-CM | POA: Diagnosis not present

## 2022-02-25 DIAGNOSIS — I2510 Atherosclerotic heart disease of native coronary artery without angina pectoris: Secondary | ICD-10-CM | POA: Diagnosis not present

## 2022-02-25 DIAGNOSIS — E875 Hyperkalemia: Secondary | ICD-10-CM | POA: Diagnosis not present

## 2022-02-25 DIAGNOSIS — N1831 Chronic kidney disease, stage 3a: Secondary | ICD-10-CM | POA: Diagnosis not present

## 2022-02-25 DIAGNOSIS — I1310 Hypertensive heart and chronic kidney disease without heart failure, with stage 1 through stage 4 chronic kidney disease, or unspecified chronic kidney disease: Secondary | ICD-10-CM | POA: Diagnosis not present

## 2022-02-25 DIAGNOSIS — I6522 Occlusion and stenosis of left carotid artery: Secondary | ICD-10-CM | POA: Diagnosis not present

## 2022-02-25 DIAGNOSIS — F32A Depression, unspecified: Secondary | ICD-10-CM | POA: Diagnosis not present

## 2022-02-25 DIAGNOSIS — Z86718 Personal history of other venous thrombosis and embolism: Secondary | ICD-10-CM | POA: Diagnosis not present

## 2022-02-25 DIAGNOSIS — Z7901 Long term (current) use of anticoagulants: Secondary | ICD-10-CM | POA: Diagnosis not present

## 2022-02-25 DIAGNOSIS — E1140 Type 2 diabetes mellitus with diabetic neuropathy, unspecified: Secondary | ICD-10-CM | POA: Diagnosis not present

## 2022-02-25 DIAGNOSIS — I69351 Hemiplegia and hemiparesis following cerebral infarction affecting right dominant side: Secondary | ICD-10-CM | POA: Diagnosis not present

## 2022-02-25 DIAGNOSIS — Z952 Presence of prosthetic heart valve: Secondary | ICD-10-CM | POA: Diagnosis not present

## 2022-02-25 DIAGNOSIS — S0511XD Contusion of eyeball and orbital tissues, right eye, subsequent encounter: Secondary | ICD-10-CM | POA: Diagnosis not present

## 2022-02-25 DIAGNOSIS — M1990 Unspecified osteoarthritis, unspecified site: Secondary | ICD-10-CM | POA: Diagnosis not present

## 2022-02-25 DIAGNOSIS — K219 Gastro-esophageal reflux disease without esophagitis: Secondary | ICD-10-CM | POA: Diagnosis not present

## 2022-02-28 ENCOUNTER — Encounter: Payer: Self-pay | Admitting: Cardiovascular Disease

## 2022-02-28 ENCOUNTER — Other Ambulatory Visit: Payer: Self-pay

## 2022-02-28 MED ORDER — APIXABAN 2.5 MG PO TABS: 2.5000 mg | ORAL_TABLET | Freq: Two times a day (BID) | ORAL | 5 refills | Status: DC

## 2022-02-28 NOTE — Telephone Encounter (Signed)
Prescription refill request for Eliquis received. Indication:afib Last office visit:2/23 Scr:1.0 Age: 81 Weight:66.2  kg  Prescription refilled

## 2022-03-01 DIAGNOSIS — Z86718 Personal history of other venous thrombosis and embolism: Secondary | ICD-10-CM | POA: Diagnosis not present

## 2022-03-01 DIAGNOSIS — K219 Gastro-esophageal reflux disease without esophagitis: Secondary | ICD-10-CM | POA: Diagnosis not present

## 2022-03-01 DIAGNOSIS — E1165 Type 2 diabetes mellitus with hyperglycemia: Secondary | ICD-10-CM | POA: Diagnosis not present

## 2022-03-01 DIAGNOSIS — Z9889 Other specified postprocedural states: Secondary | ICD-10-CM | POA: Diagnosis not present

## 2022-03-01 DIAGNOSIS — I1310 Hypertensive heart and chronic kidney disease without heart failure, with stage 1 through stage 4 chronic kidney disease, or unspecified chronic kidney disease: Secondary | ICD-10-CM | POA: Diagnosis not present

## 2022-03-01 DIAGNOSIS — Z556 Problems related to health literacy: Secondary | ICD-10-CM | POA: Diagnosis not present

## 2022-03-01 DIAGNOSIS — I6522 Occlusion and stenosis of left carotid artery: Secondary | ICD-10-CM | POA: Diagnosis not present

## 2022-03-01 DIAGNOSIS — I4891 Unspecified atrial fibrillation: Secondary | ICD-10-CM | POA: Diagnosis not present

## 2022-03-01 DIAGNOSIS — I69351 Hemiplegia and hemiparesis following cerebral infarction affecting right dominant side: Secondary | ICD-10-CM | POA: Diagnosis not present

## 2022-03-01 DIAGNOSIS — Z952 Presence of prosthetic heart valve: Secondary | ICD-10-CM | POA: Diagnosis not present

## 2022-03-01 DIAGNOSIS — E1122 Type 2 diabetes mellitus with diabetic chronic kidney disease: Secondary | ICD-10-CM | POA: Diagnosis not present

## 2022-03-01 DIAGNOSIS — M1990 Unspecified osteoarthritis, unspecified site: Secondary | ICD-10-CM | POA: Diagnosis not present

## 2022-03-01 DIAGNOSIS — Z794 Long term (current) use of insulin: Secondary | ICD-10-CM | POA: Diagnosis not present

## 2022-03-01 DIAGNOSIS — N1831 Chronic kidney disease, stage 3a: Secondary | ICD-10-CM | POA: Diagnosis not present

## 2022-03-01 DIAGNOSIS — I2510 Atherosclerotic heart disease of native coronary artery without angina pectoris: Secondary | ICD-10-CM | POA: Diagnosis not present

## 2022-03-01 DIAGNOSIS — S0511XD Contusion of eyeball and orbital tissues, right eye, subsequent encounter: Secondary | ICD-10-CM | POA: Diagnosis not present

## 2022-03-01 DIAGNOSIS — E875 Hyperkalemia: Secondary | ICD-10-CM | POA: Diagnosis not present

## 2022-03-01 DIAGNOSIS — S32501D Unspecified fracture of right pubis, subsequent encounter for fracture with routine healing: Secondary | ICD-10-CM | POA: Diagnosis not present

## 2022-03-01 DIAGNOSIS — E1140 Type 2 diabetes mellitus with diabetic neuropathy, unspecified: Secondary | ICD-10-CM | POA: Diagnosis not present

## 2022-03-01 DIAGNOSIS — Z7901 Long term (current) use of anticoagulants: Secondary | ICD-10-CM | POA: Diagnosis not present

## 2022-03-01 DIAGNOSIS — S42301D Unspecified fracture of shaft of humerus, right arm, subsequent encounter for fracture with routine healing: Secondary | ICD-10-CM | POA: Diagnosis not present

## 2022-03-01 DIAGNOSIS — F32A Depression, unspecified: Secondary | ICD-10-CM | POA: Diagnosis not present

## 2022-03-01 DIAGNOSIS — K5909 Other constipation: Secondary | ICD-10-CM | POA: Diagnosis not present

## 2022-03-01 DIAGNOSIS — Z853 Personal history of malignant neoplasm of breast: Secondary | ICD-10-CM | POA: Diagnosis not present

## 2022-03-02 DIAGNOSIS — K219 Gastro-esophageal reflux disease without esophagitis: Secondary | ICD-10-CM | POA: Diagnosis not present

## 2022-03-02 DIAGNOSIS — S42301D Unspecified fracture of shaft of humerus, right arm, subsequent encounter for fracture with routine healing: Secondary | ICD-10-CM | POA: Diagnosis not present

## 2022-03-02 DIAGNOSIS — S32501D Unspecified fracture of right pubis, subsequent encounter for fracture with routine healing: Secondary | ICD-10-CM | POA: Diagnosis not present

## 2022-03-02 DIAGNOSIS — M1990 Unspecified osteoarthritis, unspecified site: Secondary | ICD-10-CM | POA: Diagnosis not present

## 2022-03-02 DIAGNOSIS — I6522 Occlusion and stenosis of left carotid artery: Secondary | ICD-10-CM | POA: Diagnosis not present

## 2022-03-02 DIAGNOSIS — I69351 Hemiplegia and hemiparesis following cerebral infarction affecting right dominant side: Secondary | ICD-10-CM | POA: Diagnosis not present

## 2022-03-02 DIAGNOSIS — Z556 Problems related to health literacy: Secondary | ICD-10-CM | POA: Diagnosis not present

## 2022-03-02 DIAGNOSIS — Z86718 Personal history of other venous thrombosis and embolism: Secondary | ICD-10-CM | POA: Diagnosis not present

## 2022-03-02 DIAGNOSIS — I1310 Hypertensive heart and chronic kidney disease without heart failure, with stage 1 through stage 4 chronic kidney disease, or unspecified chronic kidney disease: Secondary | ICD-10-CM | POA: Diagnosis not present

## 2022-03-02 DIAGNOSIS — E1122 Type 2 diabetes mellitus with diabetic chronic kidney disease: Secondary | ICD-10-CM | POA: Diagnosis not present

## 2022-03-02 DIAGNOSIS — Z853 Personal history of malignant neoplasm of breast: Secondary | ICD-10-CM | POA: Diagnosis not present

## 2022-03-02 DIAGNOSIS — Z952 Presence of prosthetic heart valve: Secondary | ICD-10-CM | POA: Diagnosis not present

## 2022-03-02 DIAGNOSIS — I2510 Atherosclerotic heart disease of native coronary artery without angina pectoris: Secondary | ICD-10-CM | POA: Diagnosis not present

## 2022-03-02 DIAGNOSIS — K5909 Other constipation: Secondary | ICD-10-CM | POA: Diagnosis not present

## 2022-03-02 DIAGNOSIS — Z794 Long term (current) use of insulin: Secondary | ICD-10-CM | POA: Diagnosis not present

## 2022-03-02 DIAGNOSIS — Z7901 Long term (current) use of anticoagulants: Secondary | ICD-10-CM | POA: Diagnosis not present

## 2022-03-02 DIAGNOSIS — N1831 Chronic kidney disease, stage 3a: Secondary | ICD-10-CM | POA: Diagnosis not present

## 2022-03-02 DIAGNOSIS — S0511XD Contusion of eyeball and orbital tissues, right eye, subsequent encounter: Secondary | ICD-10-CM | POA: Diagnosis not present

## 2022-03-02 DIAGNOSIS — I4891 Unspecified atrial fibrillation: Secondary | ICD-10-CM | POA: Diagnosis not present

## 2022-03-02 DIAGNOSIS — E875 Hyperkalemia: Secondary | ICD-10-CM | POA: Diagnosis not present

## 2022-03-02 DIAGNOSIS — E1140 Type 2 diabetes mellitus with diabetic neuropathy, unspecified: Secondary | ICD-10-CM | POA: Diagnosis not present

## 2022-03-02 DIAGNOSIS — Z9889 Other specified postprocedural states: Secondary | ICD-10-CM | POA: Diagnosis not present

## 2022-03-02 DIAGNOSIS — F32A Depression, unspecified: Secondary | ICD-10-CM | POA: Diagnosis not present

## 2022-03-02 DIAGNOSIS — E1165 Type 2 diabetes mellitus with hyperglycemia: Secondary | ICD-10-CM | POA: Diagnosis not present

## 2022-03-03 DIAGNOSIS — E1165 Type 2 diabetes mellitus with hyperglycemia: Secondary | ICD-10-CM | POA: Diagnosis not present

## 2022-03-03 DIAGNOSIS — Z853 Personal history of malignant neoplasm of breast: Secondary | ICD-10-CM | POA: Diagnosis not present

## 2022-03-03 DIAGNOSIS — Z556 Problems related to health literacy: Secondary | ICD-10-CM | POA: Diagnosis not present

## 2022-03-03 DIAGNOSIS — I6522 Occlusion and stenosis of left carotid artery: Secondary | ICD-10-CM | POA: Diagnosis not present

## 2022-03-03 DIAGNOSIS — K219 Gastro-esophageal reflux disease without esophagitis: Secondary | ICD-10-CM | POA: Diagnosis not present

## 2022-03-03 DIAGNOSIS — E1140 Type 2 diabetes mellitus with diabetic neuropathy, unspecified: Secondary | ICD-10-CM | POA: Diagnosis not present

## 2022-03-03 DIAGNOSIS — I2510 Atherosclerotic heart disease of native coronary artery without angina pectoris: Secondary | ICD-10-CM | POA: Diagnosis not present

## 2022-03-03 DIAGNOSIS — F32A Depression, unspecified: Secondary | ICD-10-CM | POA: Diagnosis not present

## 2022-03-03 DIAGNOSIS — Z9889 Other specified postprocedural states: Secondary | ICD-10-CM | POA: Diagnosis not present

## 2022-03-03 DIAGNOSIS — I1310 Hypertensive heart and chronic kidney disease without heart failure, with stage 1 through stage 4 chronic kidney disease, or unspecified chronic kidney disease: Secondary | ICD-10-CM | POA: Diagnosis not present

## 2022-03-03 DIAGNOSIS — Z794 Long term (current) use of insulin: Secondary | ICD-10-CM | POA: Diagnosis not present

## 2022-03-03 DIAGNOSIS — E1122 Type 2 diabetes mellitus with diabetic chronic kidney disease: Secondary | ICD-10-CM | POA: Diagnosis not present

## 2022-03-03 DIAGNOSIS — K5909 Other constipation: Secondary | ICD-10-CM | POA: Diagnosis not present

## 2022-03-03 DIAGNOSIS — E875 Hyperkalemia: Secondary | ICD-10-CM | POA: Diagnosis not present

## 2022-03-03 DIAGNOSIS — I69351 Hemiplegia and hemiparesis following cerebral infarction affecting right dominant side: Secondary | ICD-10-CM | POA: Diagnosis not present

## 2022-03-03 DIAGNOSIS — Z7901 Long term (current) use of anticoagulants: Secondary | ICD-10-CM | POA: Diagnosis not present

## 2022-03-03 DIAGNOSIS — S42301D Unspecified fracture of shaft of humerus, right arm, subsequent encounter for fracture with routine healing: Secondary | ICD-10-CM | POA: Diagnosis not present

## 2022-03-03 DIAGNOSIS — Z86718 Personal history of other venous thrombosis and embolism: Secondary | ICD-10-CM | POA: Diagnosis not present

## 2022-03-03 DIAGNOSIS — M1990 Unspecified osteoarthritis, unspecified site: Secondary | ICD-10-CM | POA: Diagnosis not present

## 2022-03-03 DIAGNOSIS — N1831 Chronic kidney disease, stage 3a: Secondary | ICD-10-CM | POA: Diagnosis not present

## 2022-03-03 DIAGNOSIS — Z952 Presence of prosthetic heart valve: Secondary | ICD-10-CM | POA: Diagnosis not present

## 2022-03-03 DIAGNOSIS — S32501D Unspecified fracture of right pubis, subsequent encounter for fracture with routine healing: Secondary | ICD-10-CM | POA: Diagnosis not present

## 2022-03-03 DIAGNOSIS — S0511XD Contusion of eyeball and orbital tissues, right eye, subsequent encounter: Secondary | ICD-10-CM | POA: Diagnosis not present

## 2022-03-03 DIAGNOSIS — I4891 Unspecified atrial fibrillation: Secondary | ICD-10-CM | POA: Diagnosis not present

## 2022-03-07 ENCOUNTER — Ambulatory Visit (INDEPENDENT_AMBULATORY_CARE_PROVIDER_SITE_OTHER): Payer: Medicare Other | Admitting: Podiatry

## 2022-03-07 DIAGNOSIS — M79675 Pain in left toe(s): Secondary | ICD-10-CM | POA: Diagnosis not present

## 2022-03-07 DIAGNOSIS — M79674 Pain in right toe(s): Secondary | ICD-10-CM | POA: Diagnosis not present

## 2022-03-07 DIAGNOSIS — B351 Tinea unguium: Secondary | ICD-10-CM

## 2022-03-07 DIAGNOSIS — I999 Unspecified disorder of circulatory system: Secondary | ICD-10-CM

## 2022-03-09 DIAGNOSIS — K219 Gastro-esophageal reflux disease without esophagitis: Secondary | ICD-10-CM | POA: Diagnosis not present

## 2022-03-09 DIAGNOSIS — S42301D Unspecified fracture of shaft of humerus, right arm, subsequent encounter for fracture with routine healing: Secondary | ICD-10-CM | POA: Diagnosis not present

## 2022-03-09 DIAGNOSIS — I2510 Atherosclerotic heart disease of native coronary artery without angina pectoris: Secondary | ICD-10-CM | POA: Diagnosis not present

## 2022-03-09 DIAGNOSIS — E1122 Type 2 diabetes mellitus with diabetic chronic kidney disease: Secondary | ICD-10-CM | POA: Diagnosis not present

## 2022-03-09 DIAGNOSIS — K5909 Other constipation: Secondary | ICD-10-CM | POA: Diagnosis not present

## 2022-03-09 DIAGNOSIS — I4891 Unspecified atrial fibrillation: Secondary | ICD-10-CM | POA: Diagnosis not present

## 2022-03-09 DIAGNOSIS — I1310 Hypertensive heart and chronic kidney disease without heart failure, with stage 1 through stage 4 chronic kidney disease, or unspecified chronic kidney disease: Secondary | ICD-10-CM | POA: Diagnosis not present

## 2022-03-09 DIAGNOSIS — Z7901 Long term (current) use of anticoagulants: Secondary | ICD-10-CM | POA: Diagnosis not present

## 2022-03-09 DIAGNOSIS — E1140 Type 2 diabetes mellitus with diabetic neuropathy, unspecified: Secondary | ICD-10-CM | POA: Diagnosis not present

## 2022-03-09 DIAGNOSIS — S0511XD Contusion of eyeball and orbital tissues, right eye, subsequent encounter: Secondary | ICD-10-CM | POA: Diagnosis not present

## 2022-03-09 DIAGNOSIS — E1165 Type 2 diabetes mellitus with hyperglycemia: Secondary | ICD-10-CM | POA: Diagnosis not present

## 2022-03-09 DIAGNOSIS — Z9889 Other specified postprocedural states: Secondary | ICD-10-CM | POA: Diagnosis not present

## 2022-03-09 DIAGNOSIS — N1831 Chronic kidney disease, stage 3a: Secondary | ICD-10-CM | POA: Diagnosis not present

## 2022-03-09 DIAGNOSIS — I6522 Occlusion and stenosis of left carotid artery: Secondary | ICD-10-CM | POA: Diagnosis not present

## 2022-03-09 DIAGNOSIS — Z794 Long term (current) use of insulin: Secondary | ICD-10-CM | POA: Diagnosis not present

## 2022-03-09 DIAGNOSIS — Z86718 Personal history of other venous thrombosis and embolism: Secondary | ICD-10-CM | POA: Diagnosis not present

## 2022-03-09 DIAGNOSIS — I69351 Hemiplegia and hemiparesis following cerebral infarction affecting right dominant side: Secondary | ICD-10-CM | POA: Diagnosis not present

## 2022-03-09 DIAGNOSIS — Z853 Personal history of malignant neoplasm of breast: Secondary | ICD-10-CM | POA: Diagnosis not present

## 2022-03-09 DIAGNOSIS — S32501D Unspecified fracture of right pubis, subsequent encounter for fracture with routine healing: Secondary | ICD-10-CM | POA: Diagnosis not present

## 2022-03-09 DIAGNOSIS — F32A Depression, unspecified: Secondary | ICD-10-CM | POA: Diagnosis not present

## 2022-03-09 DIAGNOSIS — E875 Hyperkalemia: Secondary | ICD-10-CM | POA: Diagnosis not present

## 2022-03-09 DIAGNOSIS — Z952 Presence of prosthetic heart valve: Secondary | ICD-10-CM | POA: Diagnosis not present

## 2022-03-09 DIAGNOSIS — M1990 Unspecified osteoarthritis, unspecified site: Secondary | ICD-10-CM | POA: Diagnosis not present

## 2022-03-09 DIAGNOSIS — Z556 Problems related to health literacy: Secondary | ICD-10-CM | POA: Diagnosis not present

## 2022-03-09 NOTE — Progress Notes (Signed)
Subjective:   Patient ID: Dario Guardian, female   DOB: 81 y.o.   MRN: [id number]   HPI Patient presents stating that she has nails that she cannot cut and are sore and she has had discoloration of her right foot and it has been red and very sore.  She presents with caregiver today   ROS      Objective:  Physical Exam  Neurovascular status indicate neurology seems okay but significant loss of pulses right over left with cool right foot with mild discomfort no breakdowns of tissue noted thick yellow brittle nails that are painful 1-5 both feet     Assessment:  Appears to be a vascular condition with the pain she is in and the discoloration of her foot right over left with thick yellow brittle nailbeds 1-5 both feet painful     Plan:  Reviewed conditions with her and caregiver.  I am sending for vascular studies with the possibility for intervention due to the change in her right foot but I cannot make that answer currently.  I debrided nailbeds 1-5 both feet I discussed if any breakdown of skin were to occur she is to go straight to the emergency room if this is an acute condition.  Hopefully something can be found that can be worked on to give her relief no iatrogenic bleeding was noted with debridement

## 2022-03-10 ENCOUNTER — Telehealth: Payer: Self-pay | Admitting: *Deleted

## 2022-03-10 ENCOUNTER — Ambulatory Visit: Payer: Medicare Other | Admitting: Podiatry

## 2022-03-10 ENCOUNTER — Encounter: Payer: Self-pay | Admitting: Podiatry

## 2022-03-10 ENCOUNTER — Ambulatory Visit (HOSPITAL_COMMUNITY)
Admission: RE | Admit: 2022-03-10 | Discharge: 2022-03-10 | Disposition: A | Discharge status: Home / Self Care | Payer: Medicare Other | Source: Ambulatory Visit | Attending: Podiatry | Admitting: Podiatry

## 2022-03-10 DIAGNOSIS — I999 Unspecified disorder of circulatory system: Secondary | ICD-10-CM

## 2022-03-10 NOTE — Telephone Encounter (Signed)
Called patient and left a message that an appointment has been scheduled @ 8:40 w / [facility] ,updated Damita Dunnings that the referral was placed and he will reach out to her as well

## 2022-03-10 NOTE — Progress Notes (Signed)
Office Note     CC: Right lower extremity critical ischemia with tissue loss at the heel Requesting Provider:  Lajean Manes, MD  HPI: Rachel Santos is a 81 y.o. 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  Surgical history includes TAVR a few years ago complicated right groin pseudoaneurysm, hemorrhage which was repaired by my partner Dr. Gae Gallop.   Rachel Santos presents today accompanied by family Rachel Santos appreciated right foot pain roughly 1 month ago.  This has progressively worsened.  The pain wakes her up several times at night, and she is unable to sleep.  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 She describes her heel wound as dry, no drainage.  Rachel Santos remains independent, living down the street from family.  She uses a walker to ambulate.     Past Medical History:  Diagnosis Date   Acute CVA (cerebrovascular accident) ([place]) 01/24/2019   Anxiety    Arthritis    knees   Atrial fibrillation (HCC)    CAD (coronary artery disease)    a. pre TAVR cath 2020 mild nonobstructive CAD in the LAD and CX with moderate nononstructive disease in the mid-distal RCA and PDA.   Cancer ([place])    left breast   Cataracts, bilateral    CKD (chronic kidney disease), stage III ([place])    CVA (cerebral vascular accident) ([place])    Depression    Diabetes mellitus    DVT (deep venous thrombosis) (HCC)    hx of   Dyspnea    with exertion   Full dentures    GERD (gastroesophageal reflux disease)    Groin hematoma    Hearing aid worn    B/L   History of TIA (transient ischemic attack)    mini stroke with vision problems   Hypercholesterolemia    Hypertension    Hypertensive nephropathy    MCI (mild cognitive impairment)    Memory loss    Middle cerebral artery stenosis, left    Mitral  regurgitation    Paroxysmal atrial fibrillation (HCC)    Pneumonia    PONV (postoperative nausea and vomiting)    Premature atrial contraction    S/P Maze operation for atrial fibrillation 10/11/2012   Complete bilateral atrial lesion set using bipolar radiofrequency and cryothermy ablation with clipping of LA appendage   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   32m Sorin Memo 3D ring annuloplasty with 26 mm Edwards mc3 tricuspid ring annuloplasty    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   s/p TAVR with a 23 Edwards Sapien 3 Ultra THV via the TF approach   Thyroid nodule    Tricuspid regurgitation    Wears glasses     Past Surgical History:  Procedure Laterality Date   ARTERY REPAIR Right 01/22/2019   Procedure: Right Common Femoral Artery Repair;  Surgeon: DAngelia Mould MD;  [place] [facility]  Service: Vascular;  Laterality: Right;   CARDIAC CATHETERIZATION  09-05-12   CARDIOVERSION N/A 07/31/2012   Procedure: CARDIOVERSION;  Surgeon: PJosue Hector MD;  [place] [facility]  Service: Cardiovascular;  Laterality: N/A;   CATARACT EXTRACTION W/ INTRAOCULAR LENS  IMPLANT, BILATERAL     CESAREAN SECTION     x 2   COLONOSCOPY WITH PROPOFOL N/A 11/28/2014   Procedure: COLONOSCOPY WITH PROPOFOL;  Surgeon: PSaralyn Pilar  Benson Norway, MD;  [facility];  Service: Endoscopy;  Laterality: N/A;   COLONOSCOPY WITH PROPOFOL N/A 01/22/2021   Procedure: COLONOSCOPY WITH PROPOFOL;  Surgeon: Carol Ada, MD;  Location: [facility];  Service: Endoscopy;  Laterality: N/A;   DILATION AND CURETTAGE OF UTERUS     FEMORAL ARTERY EXPLORATION Right 01/22/2019   Procedure: RIGHT GROIN EXPLORATION with evacuation of hematoma;  Surgeon: Angelia Mould, MD;  Location: [facility];  Service: Vascular;  Laterality: Right;   FLEXIBLE SIGMOIDOSCOPY N/A 12/18/2015   Procedure: FLEXIBLE SIGMOIDOSCOPY;  Surgeon: Carol Ada, MD;  [facility]: [facility];  Service: Endoscopy;  Laterality:  N/A;   INTRAOPERATIVE TRANSESOPHAGEAL ECHOCARDIOGRAM N/A 10/11/2012   Procedure: INTRAOPERATIVE TRANSESOPHAGEAL ECHOCARDIOGRAM;  Surgeon: Rexene Alberts, MD;  Location: [place];  Service: Open Heart Surgery;  Laterality: N/A;   IR ANGIO INTRA EXTRACRAN SEL COM CAROTID INNOMINATE BILAT MOD SED  05/21/2019   IR ANGIO VERTEBRAL SEL VERTEBRAL BILAT MOD SED  05/21/2019   MASTECTOMY Left 1982   MAZE N/A 10/11/2012   Procedure: MAZE;  Surgeon: Rexene Alberts, MD;  Location: [facility];  Service: Open Heart Surgery;  Laterality: N/A;   MITRAL VALVE REPAIR N/A 10/11/2012   Procedure: MITRAL VALVE REPAIR (MVR);  Surgeon: Rexene Alberts, MD;  Location: [place];  Service: Open Heart Surgery;  Laterality: N/A;   MULTIPLE TOOTH EXTRACTIONS     RADIOLOGY WITH ANESTHESIA N/A 06/19/2019   Procedure: STENTING;  Surgeon: Luanne Bras, MD;  Location: [place];  Service: Radiology;  Laterality: N/A;   RIGHT HEART CATH AND CORONARY ANGIOGRAPHY N/A 12/24/2018   Procedure: RIGHT HEART CATH AND CORONARY ANGIOGRAPHY;  Surgeon: [name], MD;  Location: [facility];  Service: Cardiovascular;  Laterality: N/A;   TEE WITHOUT CARDIOVERSION N/A 07/31/2012   Procedure: TRANSESOPHAGEAL ECHOCARDIOGRAM (TEE);  Surgeon: Josue Hector, MD;  Location: [place];  Service: Cardiovascular;  Laterality: N/A;   TEE WITHOUT CARDIOVERSION N/A 01/22/2019   Procedure: TRANSESOPHAGEAL ECHOCARDIOGRAM (TEE);  Surgeon: [name], MD;  Location: [place] [facility];  Service: Open Heart Surgery;  Laterality: N/A;   TRANSCATHETER AORTIC VALVE REPLACEMENT, TRANSFEMORAL N/A 01/22/2019   Procedure: TRANSCATHETER AORTIC VALVE REPLACEMENT, TRANSFEMORAL;  Surgeon: [name], MD;  Location: [place] [facility];  Service: Open Heart Surgery;  Laterality: N/A;   TRICUSPID VALVE REPLACEMENT N/A 10/11/2012   Procedure: TRICUSPID VALVE REPAIR;  Surgeon: Rexene Alberts, MD;  Location: [place];  Service: Open Heart  Surgery;  Laterality: N/A;   TUBAL LIGATION      Social History   Socioeconomic History   Marital status: Widowed    Spouse name: Not on file   Number of children: 3   Years of education: Not on file   Highest education level: High school graduate  Occupational History   Occupation: Air cabin crew  Tobacco Use   Smoking status: Unknown    Passive exposure: Never   Smokeless tobacco: Never  Vaping Use   Vaping Use: Never used  Substance and Sexual Activity   Alcohol use: No   Drug use: No   Sexual activity: Never  Other Topics Concern   Not on file  Social History Narrative   04/19/21 lives alone, dgtr lives close by   Social Determinants of Health   Financial Resource Strain: Not on file  Food Insecurity: No Food Insecurity (12/20/2021)   Hunger Vital Sign    Worried About Running Out of Food in the Last Year:  Never true    Ran Out of Food in the Last Year: Never true  Transportation Needs: No Transportation Needs (12/20/2021)   PRAPARE - Hydrologist (Medical): No    Lack of Transportation (Non-Medical): No  Physical Activity: Not on file  Stress: Not on file  Social Connections: Not on file  Intimate Partner Violence: Not At Risk (12/20/2021)   Humiliation, Afraid, Rape, and Kick questionnaire    Fear of Current or Ex-Partner: No    Emotionally Abused: No    Physically Abused: No    Sexually Abused: No   Family History  Problem Relation Age of Onset   Alcohol abuse Mother    Cancer Father        prostate   Hypertension Father    Heart attack Paternal Grandfather    Stroke Neg Hx     Current Outpatient Medications  Medication Sig Dispense Refill   acetaminophen (Rachel Santos) 500 MG tablet Take 1,000 mg by mouth every 6 (six) hours as needed for moderate pain or headache.     apixaban (ELIQUIS) 2.5 MG TABS tablet Take 1 tablet (2.5 mg total) by mouth 2 (two) times daily. 60 tablet 5   atorvastatin (LIPITOR) 80  MG tablet Take 1 tablet by mouth once daily 30 tablet 7   Calcium Carb-Cholecalciferol (CALCIUM 600 + D PO) Take 1 tablet by mouth 2 (two) times daily.     dapagliflozin propanediol (FARXIGA) 10 MG TABS tablet Take 10 mg by mouth daily.     docusate sodium (COLACE) 100 MG capsule Take 100-200 mg by mouth daily as needed for mild constipation.     insulin detemir (LEVEMIR) 100 UNIT/ML injection Inject 0.28 mLs (28 Units total) into the skin 2 (two) times daily. 10 mL 11   lidocaine (LIDODERM) 5 % Place 1 patch onto the skin daily. Remove & Discard patch within 12 hours or as directed by MD 30 patch 0   Multiple Vitamins-Minerals (MULTIVITAMIN WITH MINERALS) tablet Take 1 tablet by mouth daily.     nitroGLYCERIN (NITROSTAT) 0.4 MG SL tablet Place 1 tablet (0.4 mg total) under the tongue every 5 (five) minutes as needed for chest pain. 25 tablet 3   omeprazole (PRILOSEC) 40 MG capsule Take 40 mg by mouth daily before breakfast.      ondansetron (ZOFRAN) 4 MG tablet Take 4 mg by mouth every 8 (eight) hours as needed for nausea or vomiting.     polyethylene glycol powder (MIRALAX) 17 GM/SCOOP powder Take 17 g by mouth 2 (two) times daily as needed for moderate constipation or mild constipation. 255 g 2   venlafaxine XR (EFFEXOR-XR) 150 MG 24 hr capsule Take 150 mg by mouth daily with breakfast. Take with 75 mg capsule to equal 225 mg daily     venlafaxine XR (EFFEXOR-XR) 75 MG 24 hr capsule Take 75 mg by mouth daily with breakfast. Take with 150 mg capsule to equal 225 mg daily     vitamin B-12 (CYANOCOBALAMIN) 500 MCG tablet Take 500 mcg by mouth every Monday.     No current facility-administered medications for this visit.    Allergies  Allergen Reactions   Buspirone Hcl     lethargic/tired   Metformin And Related Diarrhea   Ozempic (0.25 Or 0.5 Mg-Dose) [Semaglutide(0.25 Or 0.'5mg'$ -Dos)]     Other reaction(s): nausea   Ultram [Tramadol] Nausea And Vomiting   Nickel Rash    Pt unable to wear  jewelry made of nickel.  Pneumococcal Vaccines Swelling and Rash    At injection set     REVIEW OF SYSTEMS:  '[X]'$  denotes positive finding, '[ ]'$  denotes negative finding Cardiac  Comments:  Chest pain or chest pressure:    Shortness of breath upon exertion:    Short of breath when lying flat:    Irregular heart rhythm:        Vascular    Pain in calf, thigh, or hip brought on by ambulation:    Pain in feet at night that wakes you up from your sleep:     Blood clot in your veins:    Leg swelling:         Pulmonary    Oxygen at home:    Productive cough:     Wheezing:         Neurologic    Sudden weakness in arms or legs:     Sudden numbness in arms or legs:     Sudden onset of difficulty speaking or slurred speech:    Temporary loss of vision in one eye:     Problems with dizziness:         Gastrointestinal    Blood in stool:     Vomited blood:         Genitourinary    Burning when urinating:     Blood in urine:        Psychiatric    Major depression:         Hematologic    Bleeding problems:    Problems with blood clotting too easily:        Skin    Rashes or ulcers:        Constitutional    Fever or chills:      PHYSICAL EXAMINATION:  There were no vitals filed for this visit.  General:  WDWN in NAD; vital signs documented above Gait: Not observed HENT: WNL, normocephalic Pulmonary: normal non-labored breathing , without wheezing Cardiac: regular HR Abdomen: soft, NT, no masses Skin: without rashes Vascular Exam/Pulses:  Right Left  Radial 2+ (normal) 2+ (normal)  Ulnar    Femoral 2+ (normal) 2+ (normal)  Popliteal    DP absent 1+ (weak)  PT absent absent   Extremities: with ischemic changes, without Gangrene , without cellulitis; with open wounds;  Musculoskeletal: no muscle wasting or atrophy  Neurologic: A&O X 3;  No focal weakness or paresthesias are detected Psychiatric:  The pt has Normal affect.   Non-Invasive Vascular Imaging:     ABI/TBIToday's ABIToday's TBI     Previous ABIPrevious TBI  +-------+-----------+----------------+------------+------------+  Right 0.47       unable to obtain                          +-------+-----------+----------------+------------+------------+  Left  1.11       0.62                                      +-------+-----------+----------------+------------+------------+    ASSESSMENT/PLAN: Rachel Santos is a 81 y.o. female presenting with right lower extremity critical ischemia with tissue loss at the heel.  The tissue loss is minor, and appears to be a superficial ulceration.  Rachel Santos has severe rest pain at night.  Rachel Santos does not help to dissipate the pain.  She is unable to sleep.  Rachel Santos would benefit from right lower  extremity angiogram in an effort to define and possibly improve distal perfusion for wound healing.  I had a long discussion with her to her family regarding the risk and benefits.  She is aware she may require open surgery, that there is a limb loss risk associated with her current diagnosis.  Discussing the risk and benefits, Rachel Santos elected to proceed.  I have called my partner Dr. Marjean Donna, and have scheduled her for Monday due to the significant rest pain.  I have also given her a limited prescription for narcotic pain medication so that she can sleep through the weekend. Asked that she stop her Eliquis tomorrow, and initiate 81 mg aspirin.  I am unsure as to the current etiology of her right lower extremity critical limb ischemia.  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   Broadus John, MD [facility] [phone]

## 2022-03-10 NOTE — Telephone Encounter (Signed)
Patient's daughter is calling to ask that the physician contact her '@[phone] with the [place] results that were done today.

## 2022-03-11 ENCOUNTER — Ambulatory Visit: Payer: Medicare Other | Admitting: Vascular Surgery

## 2022-03-11 ENCOUNTER — Other Ambulatory Visit: Payer: Self-pay

## 2022-03-11 ENCOUNTER — Encounter: Payer: Self-pay | Admitting: Vascular Surgery

## 2022-03-11 VITALS — BP 130/72 | HR 70 | Temp 97.7°F | Resp 20 | Ht 66.0 in | Wt 146.0 lb

## 2022-03-11 DIAGNOSIS — I70234 Atherosclerosis of native arteries of right leg with ulceration of heel and midfoot: Secondary | ICD-10-CM

## 2022-03-11 MED ORDER — OXYCODONE HCL 5 MG PO TABS: 5.0000 mg | ORAL_TABLET | Freq: Three times a day (TID) | ORAL | 0 refills | Status: DC | PRN

## 2022-03-14 ENCOUNTER — Ambulatory Visit (HOSPITAL_COMMUNITY)
Admission: RE | Admit: 2022-03-14 | Discharge: 2022-03-14 | Disposition: A | Discharge status: Home / Self Care | Payer: Medicare Other | Source: Ambulatory Visit | Attending: Vascular Surgery | Admitting: Vascular Surgery

## 2022-03-14 ENCOUNTER — Encounter (HOSPITAL_COMMUNITY)
Admission: RE | Disposition: A | Discharge status: Home / Self Care | Payer: Self-pay | Source: Ambulatory Visit | Attending: Vascular Surgery

## 2022-03-14 ENCOUNTER — Other Ambulatory Visit: Payer: Self-pay

## 2022-03-14 ENCOUNTER — Encounter: Payer: Self-pay | Admitting: Vascular Surgery

## 2022-03-14 DIAGNOSIS — I70222 Atherosclerosis of native arteries of extremities with rest pain, left leg: Secondary | ICD-10-CM

## 2022-03-14 DIAGNOSIS — E1151 Type 2 diabetes mellitus with diabetic peripheral angiopathy without gangrene: Secondary | ICD-10-CM | POA: Insufficient documentation

## 2022-03-14 DIAGNOSIS — I70221 Atherosclerosis of native arteries of extremities with rest pain, right leg: Secondary | ICD-10-CM | POA: Insufficient documentation

## 2022-03-14 DIAGNOSIS — I70234 Atherosclerosis of native arteries of right leg with ulceration of heel and midfoot: Secondary | ICD-10-CM

## 2022-03-14 DIAGNOSIS — Z7984 Long term (current) use of oral hypoglycemic drugs: Secondary | ICD-10-CM | POA: Diagnosis not present

## 2022-03-14 DIAGNOSIS — Z794 Long term (current) use of insulin: Secondary | ICD-10-CM | POA: Diagnosis not present

## 2022-03-14 HISTORY — PX: ABDOMINAL AORTOGRAM W/LOWER EXTREMITY: CATH118223

## 2022-03-14 HISTORY — PX: PERIPHERAL VASCULAR BALLOON ANGIOPLASTY: CATH118281

## 2022-03-14 HISTORY — PX: PERIPHERAL VASCULAR INTERVENTION: CATH118257

## 2022-03-14 LAB — POCT I-STAT, CHEM 8
BUN: 32 mg/dL — ABNORMAL HIGH (ref 8–23)
Calcium, Ion: 1.25 mmol/L (ref 1.15–1.40)
Chloride: 102 mmol/L (ref 98–111)
Creatinine, Ser: 0.7 mg/dL (ref 0.44–1.00)
Glucose, Bld: 261 mg/dL — ABNORMAL HIGH (ref 70–99)
HCT: 45 % (ref 36.0–46.0)
Hemoglobin: 15.3 g/dL — ABNORMAL HIGH (ref 12.0–15.0)
Potassium: 4.5 mmol/L (ref 3.5–5.1)
Sodium: 140 mmol/L (ref 135–145)
TCO2: 27 mmol/L (ref 22–32)

## 2022-03-14 SURGERY — ABDOMINAL AORTOGRAM W/LOWER EXTREMITY
Anesthesia: LOCAL | Laterality: Right

## 2022-03-14 MED ORDER — SODIUM CHLORIDE 0.9% FLUSH: 3.0000 mL | INTRAVENOUS | Status: DC | PRN

## 2022-03-14 MED ORDER — LIDOCAINE HCL (PF) 1 % IJ SOLN
INTRAMUSCULAR | Status: DC | PRN
  Administered 2022-03-14: 5 mL
  Administered 2022-03-14: 15 mL

## 2022-03-14 MED ORDER — FENTANYL CITRATE (PF) 100 MCG/2ML IJ SOLN
INTRAMUSCULAR | Status: DC | PRN
  Administered 2022-03-14 (×5): 25 ug via INTRAVENOUS

## 2022-03-14 MED ORDER — ASPIRIN 81 MG PO TBEC: 81.0000 mg | DELAYED_RELEASE_TABLET | Freq: Every day | ORAL | Status: DC

## 2022-03-14 MED ORDER — SODIUM CHLORIDE 0.9 % IV SOLN: INTRAVENOUS | Status: DC

## 2022-03-14 MED ORDER — CLOPIDOGREL BISULFATE 75 MG PO TABS: 300.0000 mg | ORAL_TABLET | Freq: Once | ORAL | Status: AC

## 2022-03-14 MED ORDER — ONDANSETRON HCL 4 MG/2ML IJ SOLN: 4.0000 mg | Freq: Four times a day (QID) | INTRAMUSCULAR | Status: DC | PRN

## 2022-03-14 MED ORDER — HEPARIN (PORCINE) IN NACL 1000-0.9 UT/500ML-% IV SOLN
INTRAVENOUS | Status: AC
  Filled 2022-03-14: qty 1000

## 2022-03-14 MED ORDER — HEPARIN SODIUM (PORCINE) 1000 UNIT/ML IJ SOLN
INTRAMUSCULAR | Status: DC | PRN
  Administered 2022-03-14: 7000 [IU] via INTRAVENOUS

## 2022-03-14 MED ORDER — HEPARIN SODIUM (PORCINE) 1000 UNIT/ML IJ SOLN
INTRAMUSCULAR | Status: AC
  Filled 2022-03-14: qty 10

## 2022-03-14 MED ORDER — HYDRALAZINE HCL 20 MG/ML IJ SOLN: 5.0000 mg | INTRAMUSCULAR | Status: DC | PRN

## 2022-03-14 MED ORDER — CLOPIDOGREL BISULFATE 300 MG PO TABS
ORAL_TABLET | ORAL | Status: DC | PRN
  Administered 2022-03-14: 300 mg via ORAL

## 2022-03-14 MED ORDER — MIDAZOLAM HCL 2 MG/2ML IJ SOLN
INTRAMUSCULAR | Status: AC
  Filled 2022-03-14: qty 2

## 2022-03-14 MED ORDER — SODIUM CHLORIDE 0.9 % WEIGHT BASED INFUSION
1.0000 mL/kg/h | INTRAVENOUS | Status: DC
  Administered 2022-03-14: 1 mL/kg/h via INTRAVENOUS

## 2022-03-14 MED ORDER — MIDAZOLAM HCL 2 MG/2ML IJ SOLN
INTRAMUSCULAR | Status: DC | PRN
  Administered 2022-03-14 (×2): .5 mg via INTRAVENOUS
  Administered 2022-03-14: 1 mg via INTRAVENOUS

## 2022-03-14 MED ORDER — LIDOCAINE HCL (PF) 1 % IJ SOLN
INTRAMUSCULAR | Status: AC
  Filled 2022-03-14: qty 30

## 2022-03-14 MED ORDER — FENTANYL CITRATE (PF) 100 MCG/2ML IJ SOLN
INTRAMUSCULAR | Status: AC
  Filled 2022-03-14: qty 2

## 2022-03-14 MED ORDER — ASPIRIN 325 MG PO TABS
ORAL_TABLET | ORAL | Status: AC
  Filled 2022-03-14: qty 1

## 2022-03-14 MED ORDER — CLOPIDOGREL BISULFATE 300 MG PO TABS
ORAL_TABLET | ORAL | Status: AC
  Filled 2022-03-14: qty 1

## 2022-03-14 MED ORDER — SODIUM CHLORIDE 0.9 % IV SOLN: 250.0000 mL | INTRAVENOUS | Status: DC | PRN

## 2022-03-14 MED ORDER — ASPIRIN 325 MG PO TABS
ORAL_TABLET | ORAL | Status: DC | PRN
  Administered 2022-03-14: 325 mg via ORAL

## 2022-03-14 MED ORDER — CLOPIDOGREL BISULFATE 75 MG PO TABS: 75.0000 mg | ORAL_TABLET | Freq: Every day | ORAL | Status: DC

## 2022-03-14 MED ORDER — SODIUM CHLORIDE 0.9% FLUSH: 3.0000 mL | Freq: Two times a day (BID) | INTRAVENOUS | Status: DC

## 2022-03-14 MED ORDER — ACETAMINOPHEN 325 MG PO TABS: 650.0000 mg | ORAL_TABLET | ORAL | Status: DC | PRN

## 2022-03-14 MED ORDER — CLOPIDOGREL BISULFATE 75 MG PO TABS: 75.0000 mg | ORAL_TABLET | Freq: Every day | ORAL | 11 refills | Status: DC

## 2022-03-14 MED ORDER — IODIXANOL 320 MG/ML IV SOLN
INTRAVENOUS | Status: DC | PRN
  Administered 2022-03-14: 165 mL

## 2022-03-14 MED ORDER — LABETALOL HCL 5 MG/ML IV SOLN: 10.0000 mg | INTRAVENOUS | Status: DC | PRN

## 2022-03-14 MED ORDER — HEPARIN (PORCINE) IN NACL 1000-0.9 UT/500ML-% IV SOLN
INTRAVENOUS | Status: DC | PRN
  Administered 2022-03-14 (×2): 500 mL

## 2022-03-14 SURGICAL SUPPLY — 23 items
BALLN MUSTANG 5X150X135 (BALLOONS) ×2
BALLN STERLING OTW 3X220X150 (BALLOONS) ×2
BALLOON MUSTANG 5X150X135 (BALLOONS) IMPLANT
BALLOON STERLING OTW 3X220X150 (BALLOONS) IMPLANT
CATH OMNI FLUSH 5F 65CM (CATHETERS) IMPLANT
CATH SOFT-VU ST 4F 90CM (CATHETERS) IMPLANT
CLOSURE PERCLOSE PROSTYLE (VASCULAR PRODUCTS) IMPLANT
DEVICE CLOSURE MYNXGRIP 5F (Vascular Products) IMPLANT
DEVICE CLOSURE MYNXGRIP 6/7F (Vascular Products) IMPLANT
GUIDEWIRE ANGLED .035X260CM (WIRE) IMPLANT
KIT MICROPUNCTURE NIT STIFF (SHEATH) IMPLANT
KIT PV (KITS) ×3 IMPLANT
SHEATH CATAPULT 6F 45 RDC (SHEATH) IMPLANT
SHEATH PINNACLE 5F 10CM (SHEATH) IMPLANT
SHEATH PINNACLE 6F 10CM (SHEATH) IMPLANT
SHEATH PROBE COVER 6X72 (BAG) IMPLANT
STENT ELUVIA 6X150X130 (Permanent Stent) IMPLANT
SYR MEDRAD MARK 7 150ML (SYRINGE) ×3 IMPLANT
TRANSDUCER W/STOPCOCK (MISCELLANEOUS) ×3 IMPLANT
TRAY PV CATH (CUSTOM PROCEDURE TRAY) ×3 IMPLANT
WIRE BENTSON .035X145CM (WIRE) IMPLANT
WIRE G V18X300CM (WIRE) IMPLANT
WIRE HI TORQ VERSACORE 300 (WIRE) IMPLANT

## 2022-03-14 NOTE — Progress Notes (Signed)
Post-ambulation done, pt tolerated well. No bleeding or hematoma noted.

## 2022-03-14 NOTE — Op Note (Addendum)
DATE OF SERVICE: 03/14/2022  PATIENT:  Rachel Santos  81 y.o. 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  PATIENT DISPOSITION:  PACU - hemodynamically stable.   Delay start of Pharmacological VTE agent (>24hrs) due to surgical blood loss or risk of bleeding: no  INDICATION FOR PROCEDURE: PatrSUSANE BEYa 81 y46. female with left lower extremity heel ulceration and rest pain . After careful discussion of risks, benefits, and alternatives the patient was offered angiography. The patient understood and wished to proceed.  OPERATIVE FINDINGS:  Terminal aorta and iliac arteries: Widely patent  Right lower extremity: Common femoral artery: widely patent  Profunda femoris artery: widely patent  Superficial femoral artery: diffusely diseased with multifocal disease >80% Popliteal artery: diffusely diseased without significant stenosis Anterior tibial artery: patent to mid calf Tibioperoneal trunk: occluded, reconstitutes at origin of peroneal Peroneal artery: fills pedal collaterals distally. Not well seen Posterior tibial artery: diffusely diseased, occluded in ankle Pedal circulation: disadvantaged  DESCRIPTION OF PROCEDURE: After identification of the patient in the pre-operative holding area, the patient was transferred  to the operating room. The patient was positioned supine on the operating room table. Anesthesia was induced. The groins was prepped and draped in standard fashion. A surgical pause was performed confirming correct patient, procedure, and operative location.  The right groin was anesthetized with subcutaneous injection of 1% lidocaine. Using ultrasound guidance, the right common femoral artery was accessed with micropuncture technique. Fluoroscopy was used to confirm cannulation over the femoral head. The 1F sheath was upsized to 83F.   A Benson wire was advanced into the distal aorta. Over the wire an omni flush catheter was advanced to the level of L2. Aortogram was performed - see above for details.   The left common iliac artery was selected with an omniflush catheter and Benson guidewire. The wire was advanced into the common femoral artery. Over the wire the omni flush catheter was advanced into the external iliac artery. Selective angiography was performed - see above for details.   The decision was made to intervene. The patient was heparinized with 7,000 units of heparin. The 83F sheath was exchanged for a 36F x 45cm sheath. Selective angiography of the left lower extremity was performed prior to intervention.   The lesions were treated with: Right  femoropopliteal angioplasty and stenting (6x150mm33mvia x2)  Right  tibioperoneal trunk angioplasty (3x220mm)67mht  peroneal angioplasty (3x220mm) 9mpletion angiography revealed:  Much improved flow to the foot  A mynx device was used to close the arteriotomy. Hemostasis was excellent upon completion.  Conscious sedation was administered with the use of IV fentanyl and midazolam under continuous physician and nurse monitoring.  Heart rate, blood pressure, and oxygen saturation were continuously monitored.  Total sedation time was 70 minutes  Upon completion of the case instrument and sharps counts were confirmed correct. The patient was  transferred to  the PACU in good condition. I was present for all portions of the procedure.  PLAN: ASA '81mg'$  PO QD. Plavix '75mg'$  PO QD. High intensity statin therapy. Follow up with Dr. Virl Cagey or me in 4 weeks with ABI and RLE duplex  Yevonne Aline. Stanford Breed, MD [facility] Phone Number: [phone] 03/14/2022 12:22 PM

## 2022-03-14 NOTE — Progress Notes (Signed)
Pt and daughter received d/c instructions, written and verbal. Charge nurse notified of further concerns and doppler used to check RLE. Pulses presented.

## 2022-03-16 ENCOUNTER — Encounter (HOSPITAL_COMMUNITY): Payer: Self-pay | Admitting: Vascular Surgery

## 2022-03-16 DIAGNOSIS — I1310 Hypertensive heart and chronic kidney disease without heart failure, with stage 1 through stage 4 chronic kidney disease, or unspecified chronic kidney disease: Secondary | ICD-10-CM | POA: Diagnosis not present

## 2022-03-16 DIAGNOSIS — Z556 Problems related to health literacy: Secondary | ICD-10-CM | POA: Diagnosis not present

## 2022-03-16 DIAGNOSIS — Z86718 Personal history of other venous thrombosis and embolism: Secondary | ICD-10-CM | POA: Diagnosis not present

## 2022-03-16 DIAGNOSIS — E1165 Type 2 diabetes mellitus with hyperglycemia: Secondary | ICD-10-CM | POA: Diagnosis not present

## 2022-03-16 DIAGNOSIS — S32501D Unspecified fracture of right pubis, subsequent encounter for fracture with routine healing: Secondary | ICD-10-CM | POA: Diagnosis not present

## 2022-03-16 DIAGNOSIS — Z794 Long term (current) use of insulin: Secondary | ICD-10-CM | POA: Diagnosis not present

## 2022-03-16 DIAGNOSIS — K5909 Other constipation: Secondary | ICD-10-CM | POA: Diagnosis not present

## 2022-03-16 DIAGNOSIS — S42301D Unspecified fracture of shaft of humerus, right arm, subsequent encounter for fracture with routine healing: Secondary | ICD-10-CM | POA: Diagnosis not present

## 2022-03-16 DIAGNOSIS — M1990 Unspecified osteoarthritis, unspecified site: Secondary | ICD-10-CM | POA: Diagnosis not present

## 2022-03-16 DIAGNOSIS — E875 Hyperkalemia: Secondary | ICD-10-CM | POA: Diagnosis not present

## 2022-03-16 DIAGNOSIS — Z7901 Long term (current) use of anticoagulants: Secondary | ICD-10-CM | POA: Diagnosis not present

## 2022-03-16 DIAGNOSIS — S0511XD Contusion of eyeball and orbital tissues, right eye, subsequent encounter: Secondary | ICD-10-CM | POA: Diagnosis not present

## 2022-03-16 DIAGNOSIS — E1140 Type 2 diabetes mellitus with diabetic neuropathy, unspecified: Secondary | ICD-10-CM | POA: Diagnosis not present

## 2022-03-16 DIAGNOSIS — I2510 Atherosclerotic heart disease of native coronary artery without angina pectoris: Secondary | ICD-10-CM | POA: Diagnosis not present

## 2022-03-16 DIAGNOSIS — N1831 Chronic kidney disease, stage 3a: Secondary | ICD-10-CM | POA: Diagnosis not present

## 2022-03-16 DIAGNOSIS — F32A Depression, unspecified: Secondary | ICD-10-CM | POA: Diagnosis not present

## 2022-03-16 DIAGNOSIS — Z952 Presence of prosthetic heart valve: Secondary | ICD-10-CM | POA: Diagnosis not present

## 2022-03-16 DIAGNOSIS — E1122 Type 2 diabetes mellitus with diabetic chronic kidney disease: Secondary | ICD-10-CM | POA: Diagnosis not present

## 2022-03-16 DIAGNOSIS — I69351 Hemiplegia and hemiparesis following cerebral infarction affecting right dominant side: Secondary | ICD-10-CM | POA: Diagnosis not present

## 2022-03-16 DIAGNOSIS — I6522 Occlusion and stenosis of left carotid artery: Secondary | ICD-10-CM | POA: Diagnosis not present

## 2022-03-16 DIAGNOSIS — I4891 Unspecified atrial fibrillation: Secondary | ICD-10-CM | POA: Diagnosis not present

## 2022-03-16 DIAGNOSIS — K219 Gastro-esophageal reflux disease without esophagitis: Secondary | ICD-10-CM | POA: Diagnosis not present

## 2022-03-16 DIAGNOSIS — Z853 Personal history of malignant neoplasm of breast: Secondary | ICD-10-CM | POA: Diagnosis not present

## 2022-03-16 DIAGNOSIS — Z9889 Other specified postprocedural states: Secondary | ICD-10-CM | POA: Diagnosis not present

## 2022-03-17 ENCOUNTER — Other Ambulatory Visit: Payer: Self-pay

## 2022-03-17 ENCOUNTER — Encounter: Payer: Self-pay | Admitting: Vascular Surgery

## 2022-03-17 ENCOUNTER — Telehealth: Payer: Self-pay

## 2022-03-17 DIAGNOSIS — I70234 Atherosclerosis of native arteries of right leg with ulceration of heel and midfoot: Secondary | ICD-10-CM

## 2022-03-17 NOTE — Telephone Encounter (Signed)
Patient's daughter Rachel Santos called in stating her mom has had worsening R foot pains since her procedure on 03/14/22. She reports that her mom's foot has been hot and cold on and off, with redness & Pounding/throbbing. I spoke with our on site PA [facility] for recommendations. He advised the pains may be coming from the increased blood flow to the foot after the procedure but is unable to determine the etiology without U/S. I then advised [name] that the U/S is needed prior to being seen by Dr. Virl Cagey tomorrow. Appts scheduled.

## 2022-03-17 NOTE — Progress Notes (Signed)
Office Note    HPI: Rachel Santos is a 81 y.o. (November 30, 1940) female presenting with concerns for right foot pain status post right lower extremity angiogram with femoropopliteal angioplasty stenting, TPT angioplasty, peroneal artery angioplasty.  Surgical history also includes TAVR a few years ago complicated right groin pseudoaneurysm, hemorrhage which was repaired by my partner Dr. Gae Gallop.   Rachel Santos presents today accompanied by family Rachel Santos appreciated right foot pain roughly 1 month ago.  This has progressively worsened.  The pain wakes her up several times at night, and she is unable to sleep.  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 She describes her heel wound as dry, no drainage.    Past Medical History:  Diagnosis Date   Acute CVA (cerebrovascular accident) ([place]) 01/24/2019   Anxiety    Arthritis    knees   Atrial fibrillation (HCC)    CAD (coronary artery disease)    a. pre TAVR cath 2020 mild nonobstructive CAD in the LAD and CX with moderate nononstructive disease in the mid-distal RCA and PDA.   Cancer ([place])    left breast   Cataracts, bilateral    CKD (chronic kidney disease), stage III (HCC)    CVA (cerebral vascular accident) ([place])    Depression    Diabetes mellitus    DVT (deep venous thrombosis) (HCC)    hx of   Dyspnea    with exertion   Full dentures    GERD (gastroesophageal reflux disease)    Groin hematoma    Hearing aid worn    B/L   History of TIA (transient ischemic attack)    mini stroke with vision problems   Hypercholesterolemia    Hypertension    Hypertensive nephropathy    MCI (mild cognitive impairment)    Memory loss    Middle cerebral artery stenosis, left    Mitral regurgitation    Paroxysmal atrial fibrillation (HCC)    Pneumonia    PONV (postoperative nausea and vomiting)    Premature atrial contraction    S/P Maze  operation for atrial fibrillation 10/11/2012   Complete bilateral atrial lesion set using bipolar radiofrequency and cryothermy ablation with clipping of LA appendage   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   48m Sorin Memo 3D ring annuloplasty with 26 mm Edwards mc3 tricuspid ring annuloplasty    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   s/p TAVR with a 23 Edwards Sapien 3 Ultra THV via the TF approach   Thyroid nodule    Tricuspid regurgitation    Wears glasses     Past Surgical History:  Procedure Laterality Date   ABDOMINAL AORTOGRAM W/LOWER EXTREMITY N/A 03/14/2022   Procedure: ABDOMINAL AORTOGRAM W/LOWER EXTREMITY;  Surgeon: HCherre  MD;  [facility];  Service: Cardiovascular;  Laterality: N/A;   ARTERY REPAIR Right 01/22/2019   Procedure: Right Common Femoral Artery Repair;  Surgeon: DAngelia Mould MD;  [facility]  Service: Vascular;  Laterality: Right;   CARDIAC CATHETERIZATION  09-05-12   CARDIOVERSION N/A 07/31/2012   Procedure: CARDIOVERSION;  Surgeon: PJosue Hector MD;  [facility]  Service: Cardiovascular;  Laterality: N/A;   CATARACT EXTRACTION W/ INTRAOCULAR LENS  IMPLANT, BILATERAL     CESAREAN SECTION     x 2   COLONOSCOPY WITH PROPOFOL N/A 11/28/2014   Procedure: COLONOSCOPY WITH PROPOFOL;  Surgeon: PCarol Ada MD;  [facility];  Service: Endoscopy;  Laterality: N/A;   COLONOSCOPY WITH PROPOFOL N/A 01/22/2021   Procedure: COLONOSCOPY WITH PROPOFOL;  Surgeon: Carol Ada, MD;  [facility];  Service: Endoscopy;  Laterality: N/A;   DILATION AND CURETTAGE OF UTERUS     FEMORAL ARTERY EXPLORATION Right 01/22/2019   Procedure: RIGHT GROIN EXPLORATION with evacuation of hematoma;  Surgeon: Angelia Mould, MD;  Location: [facility];  Service: Vascular;  Laterality: Right;   FLEXIBLE SIGMOIDOSCOPY N/A 12/18/2015   Procedure: FLEXIBLE SIGMOIDOSCOPY;  Surgeon: Carol Ada, MD;   [facility];  Service: Endoscopy;  Laterality: N/A;   INTRAOPERATIVE TRANSESOPHAGEAL ECHOCARDIOGRAM N/A 10/11/2012   Procedure: INTRAOPERATIVE TRANSESOPHAGEAL ECHOCARDIOGRAM;  Surgeon: Rexene Alberts, MD;  Location: [facility];  Service: Open Heart Surgery;  Laterality: N/A;   IR ANGIO INTRA EXTRACRAN SEL COM CAROTID INNOMINATE BILAT MOD SED  05/21/2019   IR ANGIO VERTEBRAL SEL VERTEBRAL BILAT MOD SED  05/21/2019   MASTECTOMY Left 1982   MAZE N/A 10/11/2012   Procedure: MAZE;  Surgeon: Rexene Alberts, MD;  Location: [facility];  Service: Open Heart Surgery;  Laterality: N/A;   MITRAL VALVE REPAIR N/A 10/11/2012   Procedure: MITRAL VALVE REPAIR (MVR);  Surgeon: Rexene Alberts, MD;  Location: [facility];  Service: Open Heart Surgery;  Laterality: N/A;   MULTIPLE TOOTH EXTRACTIONS     PERIPHERAL VASCULAR BALLOON ANGIOPLASTY Right 03/14/2022   Procedure: PERIPHERAL VASCULAR BALLOON ANGIOPLASTY;  Surgeon: Cherre , MD;  Location: [facility];  Service: Cardiovascular;  Laterality: Right;  Peroneal   PERIPHERAL VASCULAR INTERVENTION Right 03/14/2022   Procedure: PERIPHERAL VASCULAR INTERVENTION;  Surgeon: Cherre , MD;  Location: [facility];  Service: Cardiovascular;  Laterality: Right;  SFA   RADIOLOGY WITH ANESTHESIA N/A 06/19/2019   Procedure: STENTING;  Surgeon: Luanne Bras, MD;  Location: [facility];  Service: Radiology;  Laterality: N/A;   RIGHT HEART CATH AND CORONARY ANGIOGRAPHY N/A 12/24/2018   Procedure: RIGHT HEART CATH AND CORONARY ANGIOGRAPHY;  Surgeon: [name], MD;  Location: [facility];  Service: Cardiovascular;  Laterality: N/A;   TEE WITHOUT CARDIOVERSION N/A 07/31/2012   Procedure: TRANSESOPHAGEAL ECHOCARDIOGRAM (TEE);  Surgeon: Josue Hector, MD;  Location: [facility];  Service: Cardiovascular;  Laterality: N/A;   TEE WITHOUT CARDIOVERSION N/A 01/22/2019   Procedure: TRANSESOPHAGEAL ECHOCARDIOGRAM (TEE);  Surgeon: [name], MD;  Location: [facility];  Service: Open Heart Surgery;  Laterality: N/A;   TRANSCATHETER AORTIC VALVE REPLACEMENT, TRANSFEMORAL N/A 01/22/2019   Procedure: TRANSCATHETER AORTIC VALVE REPLACEMENT, TRANSFEMORAL;  Surgeon: [name], MD;  Location: [facility];  Service: Open Heart Surgery;  Laterality: N/A;   TRICUSPID VALVE REPLACEMENT N/A 10/11/2012   Procedure: TRICUSPID VALVE REPAIR;  Surgeon: Rexene Alberts, MD;  Location: [facility];  Service: Open Heart Surgery;  Laterality: N/A;   TUBAL LIGATION      Social History   Socioeconomic History   Marital status: Widowed    Spouse name: Not on file   Number of children: 3   Years of education: Not on file   Highest education level: High school graduate  Occupational History   Occupation: Air cabin crew  Tobacco Use   Smoking status: Unknown    Passive exposure: Never   Smokeless tobacco: Never  Vaping Use   Vaping Use: Never used  Substance and Sexual Activity   Alcohol use: No   Drug use: No  Sexual activity: Never  Other Topics Concern   Not on file  Social History Narrative   04/19/21 lives alone, dgtr lives close by   Social Determinants of Health   Financial Resource Strain: Not on file  Food Insecurity: No Food Insecurity (12/20/2021)   Hunger Vital Sign    Worried About Running Out of Food in the Last Year: Never true    Ran Out of Food in the Last Year: Never true  Transportation Needs: No Transportation Needs (12/20/2021)   PRAPARE - Hydrologist (Medical): No    Lack of Transportation (Non-Medical): No  Physical Activity: Not on file  Stress: Not on file  Social Connections: Not on file  Intimate Partner Violence: Not At Risk (12/20/2021)   Humiliation, Afraid, Rape, and Kick questionnaire    Fear of Current or Ex-Partner: No    Emotionally Abused: No    Physically Abused: No    Sexually Abused: No   Family History   Problem Relation Age of Onset   Alcohol abuse Mother    Cancer Father        prostate   Hypertension Father    Heart attack Paternal Grandfather    Stroke Neg Hx     Current Outpatient Medications  Medication Sig Dispense Refill   acetaminophen (TYLENOL) 500 MG tablet Take 1,000 mg by mouth every 6 (six) hours as needed for moderate pain or headache.     apixaban (ELIQUIS) 2.5 MG TABS tablet Take 1 tablet (2.5 mg total) by mouth 2 (two) times daily. 60 tablet 5   atorvastatin (LIPITOR) 80 MG tablet Take 1 tablet by mouth once daily 30 tablet 7   Calcium Carb-Cholecalciferol (CALCIUM 600 + D PO) Take 1 tablet by mouth 2 (two) times daily.     clopidogrel (PLAVIX) 75 MG tablet Take 1 tablet (75 mg total) by mouth daily. 30 tablet 11   dapagliflozin propanediol (FARXIGA) 10 MG TABS tablet Take 10 mg by mouth daily.     docusate sodium (COLACE) 100 MG capsule Take 100-200 mg by mouth daily as needed for mild constipation.     insulin detemir (LEVEMIR) 100 UNIT/ML injection Inject 0.28 mLs (28 Units total) into the skin 2 (two) times daily. 10 mL 11   lidocaine (LIDODERM) 5 % Place 1 patch onto the skin daily. Remove & Discard patch within 12 hours or as directed by MD 30 patch 0   Multiple Vitamins-Minerals (MULTIVITAMIN WITH MINERALS) tablet Take 1 tablet by mouth daily.     nitroGLYCERIN (NITROSTAT) 0.4 MG SL tablet Place 1 tablet (0.4 mg total) under the tongue every 5 (five) minutes as needed for chest pain. 25 tablet 3   omeprazole (PRILOSEC) 40 MG capsule Take 40 mg by mouth daily before breakfast.      ondansetron (ZOFRAN) 4 MG tablet Take 4 mg by mouth every 8 (eight) hours as needed for nausea or vomiting.     oxyCODONE (OXY IR/ROXICODONE) 5 MG immediate release tablet Take 1 tablet (5 mg total) by mouth every 8 (eight) hours as needed for severe pain. 5 tablet 0   polyethylene glycol powder (MIRALAX) 17 GM/SCOOP powder Take 17 g by mouth 2 (two) times daily as needed for moderate  constipation or mild constipation. 255 g 2   venlafaxine XR (EFFEXOR-XR) 150 MG 24 hr capsule Take 150 mg by mouth daily with breakfast. Take with 75 mg capsule to equal 225 mg daily     venlafaxine XR (  EFFEXOR-XR) 75 MG 24 hr capsule Take 75 mg by mouth daily with breakfast. Take with 150 mg capsule to equal 225 mg daily     vitamin B-12 (CYANOCOBALAMIN) 500 MCG tablet Take 500 mcg by mouth every Monday.     No current facility-administered medications for this visit.    Allergies  Allergen Reactions   Buspirone Hcl     lethargic/tired   Metformin And Related Diarrhea   Ozempic (0.25 Or 0.5 Mg-Dose) [Semaglutide(0.25 Or 0.'5mg'$ -Dos)]     Other reaction(s): nausea   Ultram [Tramadol] Nausea And Vomiting   Nickel Rash    Pt unable to wear jewelry made of nickel.   Pneumococcal Vaccines Swelling and Rash    At injection set     REVIEW OF SYSTEMS:  '[X]'$  denotes positive finding, '[ ]'$  denotes negative finding Cardiac  Comments:  Chest pain or chest pressure:    Shortness of breath upon exertion:    Short of breath when lying flat:    Irregular heart rhythm:        Vascular    Pain in calf, thigh, or hip brought on by ambulation:    Pain in feet at night that wakes you up from your sleep:     Blood clot in your veins:    Leg swelling:         Pulmonary    Oxygen at home:    Productive cough:     Wheezing:         Neurologic    Sudden weakness in arms or legs:     Sudden numbness in arms or legs:     Sudden onset of difficulty speaking or slurred speech:    Temporary loss of vision in one eye:     Problems with dizziness:         Gastrointestinal    Blood in stool:     Vomited blood:         Genitourinary    Burning when urinating:     Blood in urine:        Psychiatric    Major depression:         Hematologic    Bleeding problems:    Problems with blood clotting too easily:        Skin    Rashes or ulcers:        Constitutional    Fever or chills:       PHYSICAL EXAMINATION:  There were no vitals filed for this visit.  General:  WDWN in NAD; vital signs documented above Gait: Not observed HENT: WNL, normocephalic Pulmonary: normal non-labored breathing , without wheezing Cardiac: regular HR Abdomen: soft, NT, no masses Skin: without rashes Vascular Exam/Pulses:  Right Left  Radial 2+ (normal) 2+ (normal)  Ulnar    Femoral 2+ (normal) 2+ (normal)  Popliteal    DP absent 1+ (weak)  PT absent absent   Extremities: with ischemic changes, without Gangrene , without cellulitis; with open wounds;  Musculoskeletal: no muscle wasting or atrophy  Neurologic: A&O X 3;  No focal weakness or paresthesias are detected Psychiatric:  The pt has Normal affect.   Non-Invasive Vascular Imaging:    ABI/TBIToday's ABIToday's TBI     Previous ABIPrevious TBI  +-------+-----------+----------------+------------+------------+  Right 0.47       unable to obtain                          +-------+-----------+----------------+------------+------------+  Left  1.11       0.62                                      +-------+-----------+----------------+------------+------------+    ASSESSMENT/PLAN: NICHA HEMANN is a 81 y.o. female presenting with right lower extremity critical ischemia with tissue loss at the heel.  The tissue loss is minor, and appears to be a superficial ulceration.  Rachel Santos has severe rest pain at night.  Tylenol does not help to dissipate the pain.  She is unable to sleep.  Rachel Santos would benefit from right lower extremity angiogram in an effort to define and possibly improve distal perfusion for wound healing.  I had a long discussion with her to her family regarding the risk and benefits.  She is aware she may require open surgery, that there is a limb loss risk associated with her current diagnosis.  Discussing the risk and benefits, Rachel Santos elected to proceed.  I have called my partner Dr. Marjean Donna,  and have scheduled her for Monday due to the significant rest pain.  I have also given her a limited prescription for narcotic pain medication so that she can sleep through the weekend. Asked that she stop her Eliquis tomorrow, and initiate 81 mg aspirin.  I am unsure as to the current etiology of her right lower extremity critical limb ischemia.  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   Broadus John, MD [facility] [phone]

## 2022-03-18 ENCOUNTER — Encounter: Payer: Self-pay | Admitting: Vascular Surgery

## 2022-03-18 ENCOUNTER — Ambulatory Visit: Payer: Medicare Other | Admitting: Vascular Surgery

## 2022-03-18 ENCOUNTER — Ambulatory Visit: Payer: Medicare Other | Attending: Vascular Surgery

## 2022-03-18 VITALS — BP 150/78 | HR 71 | Temp 97.9°F | Resp 20 | Ht 66.0 in | Wt 137.0 lb

## 2022-03-18 DIAGNOSIS — Z9862 Peripheral vascular angioplasty status: Secondary | ICD-10-CM | POA: Diagnosis not present

## 2022-03-18 DIAGNOSIS — I70234 Atherosclerosis of native arteries of right leg with ulceration of heel and midfoot: Secondary | ICD-10-CM | POA: Diagnosis not present

## 2022-03-21 NOTE — H&P (Signed)
Office Note     CC: Right lower extremity critical ischemia with tissue loss at the heel Requesting Provider:  No ref. provider found  HPI: Rachel Santos is a 81 y.o. 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  Surgical history includes TAVR a few years ago complicated right groin pseudoaneurysm, hemorrhage which was repaired by my partner Dr. Gae Gallop.   Rachel Santos presents today accompanied by family [name] appreciated right foot pain roughly 1 month ago.  This has progressively worsened.  The pain wakes her up several times at night, and she is unable to sleep.  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 She describes her heel wound as dry, no drainage.  Rachel Santos remains independent, living down the street from family.  She uses a walker to ambulate.     Past Medical History:  Diagnosis Date   Acute CVA (cerebrovascular accident) ([place]) 01/24/2019   Anxiety    Arthritis    knees   Atrial fibrillation (HCC)    CAD (coronary artery disease)    a. pre TAVR cath 2020 mild nonobstructive CAD in the LAD and CX with moderate nononstructive disease in the mid-distal RCA and PDA.   Cancer ([place])    left breast   Cataracts, bilateral    CKD (chronic kidney disease), stage III (HCC)    CVA (cerebral vascular accident) ([place])    Depression    Diabetes mellitus    DVT (deep venous thrombosis) (HCC)    hx of   Dyspnea    with exertion   Full dentures    GERD (gastroesophageal reflux disease)    Groin hematoma    Hearing aid worn    B/L   History of TIA (transient ischemic attack)    mini stroke with vision problems   Hypercholesterolemia    Hypertension    Hypertensive nephropathy    MCI (mild cognitive impairment)    Memory loss    Middle cerebral artery stenosis, left     Mitral regurgitation    Paroxysmal atrial fibrillation (HCC)    Pneumonia    PONV (postoperative nausea and vomiting)    Premature atrial contraction    S/P Maze operation for atrial fibrillation 10/11/2012   Complete bilateral atrial lesion set using bipolar radiofrequency and cryothermy ablation with clipping of LA appendage   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   82m Sorin Memo 3D ring annuloplasty with 26 mm Edwards mc3 tricuspid ring annuloplasty    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   s/p TAVR with a 23 Edwards Sapien 3 Ultra THV via the TF approach   Thyroid nodule    Tricuspid regurgitation    Wears glasses     Past Surgical History:  Procedure Laterality Date   ABDOMINAL AORTOGRAM W/LOWER EXTREMITY N/A 03/14/2022   Procedure: ABDOMINAL AORTOGRAM W/LOWER EXTREMITY;  Surgeon: HCherre Robins MD;  [facility];  Service: Cardiovascular;  Laterality: N/A;   ARTERY REPAIR Right 01/22/2019   Procedure: Right Common Femoral Artery Repair;  Surgeon: DAngelia Mould MD;  [facility]  Service: Vascular;  Laterality: Right;   CARDIAC CATHETERIZATION  09-05-12   CARDIOVERSION N/A 07/31/2012   Procedure: CARDIOVERSION;  Surgeon: PJosue Hector MD;  [facility]  Service: Cardiovascular;  Laterality: N/A;   CATARACT EXTRACTION  W/ INTRAOCULAR LENS  IMPLANT, BILATERAL     CESAREAN SECTION     x 2   COLONOSCOPY WITH PROPOFOL N/A 11/28/2014   Procedure: COLONOSCOPY WITH PROPOFOL;  Surgeon: Carol Ada, MD;  [facility];  Service: Endoscopy;  Laterality: N/A;   COLONOSCOPY WITH PROPOFOL N/A 01/22/2021   Procedure: COLONOSCOPY WITH PROPOFOL;  Surgeon: Carol Ada, MD;  [facility];  Service: Endoscopy;  Laterality: N/A;   DILATION AND CURETTAGE OF UTERUS     FEMORAL ARTERY EXPLORATION Right 01/22/2019   Procedure: RIGHT GROIN EXPLORATION with evacuation of hematoma;  Surgeon: Angelia Mould, MD;   Location: [facility];  Service: Vascular;  Laterality: Right;   FLEXIBLE SIGMOIDOSCOPY N/A 12/18/2015   Procedure: FLEXIBLE SIGMOIDOSCOPY;  Surgeon: Carol Ada, MD;  [facility];  Service: Endoscopy;  Laterality: N/A;   INTRAOPERATIVE TRANSESOPHAGEAL ECHOCARDIOGRAM N/A 10/11/2012   Procedure: INTRAOPERATIVE TRANSESOPHAGEAL ECHOCARDIOGRAM;  Surgeon: Rexene Alberts, MD;  Location: [facility];  Service: Open Heart Surgery;  Laterality: N/A;   IR ANGIO INTRA EXTRACRAN SEL COM CAROTID INNOMINATE BILAT MOD SED  05/21/2019   IR ANGIO VERTEBRAL SEL VERTEBRAL BILAT MOD SED  05/21/2019   MASTECTOMY Left 1982   MAZE N/A 10/11/2012   Procedure: MAZE;  Surgeon: Rexene Alberts, MD;  Location: [facility];  Service: Open Heart Surgery;  Laterality: N/A;   MITRAL VALVE REPAIR N/A 10/11/2012   Procedure: MITRAL VALVE REPAIR (MVR);  Surgeon: Rexene Alberts, MD;  Location: [facility];  Service: Open Heart Surgery;  Laterality: N/A;   MULTIPLE TOOTH EXTRACTIONS     PERIPHERAL VASCULAR BALLOON ANGIOPLASTY Right 03/14/2022   Procedure: PERIPHERAL VASCULAR BALLOON ANGIOPLASTY;  Surgeon: Cherre Robins, MD;  Location: [facility];  Service: Cardiovascular;  Laterality: Right;  Peroneal   PERIPHERAL VASCULAR INTERVENTION Right 03/14/2022   Procedure: PERIPHERAL VASCULAR INTERVENTION;  Surgeon: Cherre Robins, MD;  Location: [facility];  Service: Cardiovascular;  Laterality: Right;  SFA   RADIOLOGY WITH ANESTHESIA N/A 06/19/2019   Procedure: STENTING;  Surgeon: Luanne Bras, MD;  Location: [facility];  Service: Radiology;  Laterality: N/A;   RIGHT HEART CATH AND CORONARY ANGIOGRAPHY N/A 12/24/2018   Procedure: RIGHT HEART CATH AND CORONARY ANGIOGRAPHY;  Surgeon: [name], MD;  Location: [facility];  Service: Cardiovascular;  Laterality: N/A;   TEE WITHOUT CARDIOVERSION N/A 07/31/2012   Procedure: TRANSESOPHAGEAL ECHOCARDIOGRAM (TEE);  Surgeon: Josue Hector, MD;  Location: [facility];   Service: Cardiovascular;  Laterality: N/A;   TEE WITHOUT CARDIOVERSION N/A 01/22/2019   Procedure: TRANSESOPHAGEAL ECHOCARDIOGRAM (TEE);  Surgeon: [name], MD;  Location: [facility];  Service: Open Heart Surgery;  Laterality: N/A;   TRANSCATHETER AORTIC VALVE REPLACEMENT, TRANSFEMORAL N/A 01/22/2019   Procedure: TRANSCATHETER AORTIC VALVE REPLACEMENT, TRANSFEMORAL;  Surgeon: [name], MD;  Location: [facility];  Service: Open Heart Surgery;  Laterality: N/A;   TRICUSPID VALVE REPLACEMENT N/A 10/11/2012   Procedure: TRICUSPID VALVE REPAIR;  Surgeon: Rexene Alberts, MD;  Location: [facility];  Service: Open Heart Surgery;  Laterality: N/A;   TUBAL LIGATION      Social History   Socioeconomic History   Marital status: Widowed    Spouse name: Not on file   Number of children: 3   Years of education: Not on file   Highest education level: High school graduate  Occupational History   Occupation: Air cabin crew  Tobacco Use   Smoking status:  Unknown    Passive exposure: Never   Smokeless tobacco: Never  Vaping Use   Vaping Use: Never used  Substance and Sexual Activity   Alcohol use: No   Drug use: No   Sexual activity: Never  Other Topics Concern   Not on file  Social History Narrative   04/19/21 lives alone, dgtr lives close by   Social Determinants of Health   Financial Resource Strain: Not on file  Food Insecurity: No Food Insecurity (12/20/2021)   Hunger Vital Sign    Worried About Running Out of Food in the Last Year: Never true    Ran Out of Food in the Last Year: Never true  Transportation Needs: No Transportation Needs (12/20/2021)   PRAPARE - Hydrologist (Medical): No    Lack of Transportation (Non-Medical): No  Physical Activity: Not on file  Stress: Not on file  Social Connections: Not on file  Intimate Partner Violence: Not At Risk (12/20/2021)   Humiliation, Afraid, Rape,  and Kick questionnaire    Fear of Current or Ex-Partner: No    Emotionally Abused: No    Physically Abused: No    Sexually Abused: No   Family History  Problem Relation Age of Onset   Alcohol abuse Mother    Cancer Father        prostate   Hypertension Father    Heart attack Paternal Grandfather    Stroke Neg Hx     No current facility-administered medications for this encounter.   Current Outpatient Medications  Medication Sig Dispense Refill   acetaminophen (Rachel) 500 MG tablet Take 1,000 mg by mouth every 6 (six) hours as needed for moderate pain or headache.     atorvastatin (LIPITOR) 80 MG tablet Take 1 tablet by mouth once daily 30 tablet 7   Calcium Carb-Cholecalciferol (CALCIUM 600 + D PO) Take 1 tablet by mouth 2 (two) times daily.     clopidogrel (PLAVIX) 75 MG tablet Take 1 tablet (75 mg total) by mouth daily. 30 tablet 11   dapagliflozin propanediol (FARXIGA) 10 MG TABS tablet Take 10 mg by mouth daily.     docusate sodium (COLACE) 100 MG capsule Take 100-200 mg by mouth daily as needed for mild constipation.     insulin detemir (LEVEMIR) 100 UNIT/ML injection Inject 0.28 mLs (28 Units total) into the skin 2 (two) times daily. 10 mL 11   Multiple Vitamins-Minerals (MULTIVITAMIN WITH MINERALS) tablet Take 1 tablet by mouth daily.     omeprazole (PRILOSEC) 40 MG capsule Take 40 mg by mouth daily before breakfast.      venlafaxine XR (EFFEXOR-XR) 150 MG 24 hr capsule Take 150 mg by mouth daily with breakfast. Take with 75 mg capsule to equal 225 mg daily     venlafaxine XR (EFFEXOR-XR) 75 MG 24 hr capsule Take 75 mg by mouth daily with breakfast. Take with 150 mg capsule to equal 225 mg daily     vitamin B-12 (CYANOCOBALAMIN) 500 MCG tablet Take 500 mcg by mouth every Monday.     apixaban (ELIQUIS) 2.5 MG TABS tablet Take 1 tablet (2.5 mg total) by mouth 2 (two) times daily. 60 tablet 5   lidocaine (LIDODERM) 5 % Place 1 patch onto the skin daily. Remove & Discard patch  within 12 hours or as directed by MD 30 patch 0   nitroGLYCERIN (NITROSTAT) 0.4 MG SL tablet Place 1 tablet (0.4 mg total) under the tongue every 5 (five) minutes as  needed for chest pain. 25 tablet 3   ondansetron (ZOFRAN) 4 MG tablet Take 4 mg by mouth every 8 (eight) hours as needed for nausea or vomiting.     polyethylene glycol powder (MIRALAX) 17 GM/SCOOP powder Take 17 g by mouth 2 (two) times daily as needed for moderate constipation or mild constipation. 255 g 2    Allergies  Allergen Reactions   Buspirone Hcl     lethargic/tired   Metformin And Related Diarrhea   Ozempic (0.25 Or 0.5 Mg-Dose) [Semaglutide(0.25 Or 0.'5mg'$ -Dos)]     Other reaction(s): nausea   Ultram [Tramadol] Nausea And Vomiting   Nickel Rash    Pt unable to wear jewelry made of nickel.   Pneumococcal Vaccines Swelling and Rash    At injection set     REVIEW OF SYSTEMS:  '[X]'$  denotes positive finding, '[ ]'$  denotes negative finding Cardiac  Comments:  Chest pain or chest pressure:    Shortness of breath upon exertion:    Short of breath when lying flat:    Irregular heart rhythm:        Vascular    Pain in calf, thigh, or hip brought on by ambulation:    Pain in feet at night that wakes you up from your sleep:     Blood clot in your veins:    Leg swelling:         Pulmonary    Oxygen at home:    Productive cough:     Wheezing:         Neurologic    Sudden weakness in arms or legs:     Sudden numbness in arms or legs:     Sudden onset of difficulty speaking or slurred speech:    Temporary loss of vision in one eye:     Problems with dizziness:         Gastrointestinal    Blood in stool:     Vomited blood:         Genitourinary    Burning when urinating:     Blood in urine:        Psychiatric    Major depression:         Hematologic    Bleeding problems:    Problems with blood clotting too easily:        Skin    Rashes or ulcers:        Constitutional    Fever or chills:       PHYSICAL EXAMINATION:  Vitals:   03/14/22 1455 03/14/22 1500 03/14/22 1505 03/14/22 1510  BP:    (!) 170/44  Pulse: (!) 59 (!) 58 (!) 56 (!) 58  Resp:      Temp:      TempSrc:      SpO2: 95% 91% 95% 95%  Weight:      Height:        General:  WDWN in NAD; vital signs documented above Gait: Not observed HENT: WNL, normocephalic Pulmonary: normal non-labored breathing , without wheezing Cardiac: regular HR Abdomen: soft, NT, no masses Skin: without rashes Vascular Exam/Pulses:  Right Left  Radial 2+ (normal) 2+ (normal)  Ulnar    Femoral 2+ (normal) 2+ (normal)  Popliteal    DP absent 1+ (weak)  PT absent absent   Extremities: with ischemic changes, without Gangrene , without cellulitis; with open wounds;  Musculoskeletal: no muscle wasting or atrophy  Neurologic: A&O X 3;  No focal weakness or paresthesias are detected Psychiatric:  The pt has Normal affect.   Non-Invasive Vascular Imaging:    ABI/TBIToday's ABIToday's TBI     Previous ABIPrevious TBI  +-------+-----------+----------------+------------+------------+  Right 0.47       unable to obtain                          +-------+-----------+----------------+------------+------------+  Left  1.11       0.62                                      +-------+-----------+----------------+------------+------------+    ASSESSMENT/PLAN: Rachel Santos is a 81 y.o. female presenting with right lower extremity critical ischemia with tissue loss at the heel.  The tissue loss is minor, and appears to be a superficial ulceration.  Rachel Santos has severe rest pain at night.  Rachel Santos to dissipate the pain.  She is unable to sleep.  Rachel Santos would benefit from right lower extremity angiogram in an effort to define and possibly improve distal perfusion for wound healing.  I had a long discussion with her to her family regarding the risk and benefits.  She is aware she may require open surgery,  that there is a limb loss risk associated with her current diagnosis.  Discussing the risk and benefits, Zuleima elected to proceed.  I have called my partner Dr. Marjean Santos, and have scheduled her for Monday due to the significant rest pain.  I have also given her a limited prescription for narcotic pain medication so that she can sleep through the weekend. Asked that she stop her Eliquis tomorrow, and initiate 81 mg aspirin.  I am unsure as to the current etiology of her right lower extremity critical limb ischemia.  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   Cherre Robins, MD [facility] [phone]

## 2022-03-23 ENCOUNTER — Other Ambulatory Visit: Payer: Self-pay

## 2022-03-23 DIAGNOSIS — M1990 Unspecified osteoarthritis, unspecified site: Secondary | ICD-10-CM | POA: Diagnosis not present

## 2022-03-23 DIAGNOSIS — S32501D Unspecified fracture of right pubis, subsequent encounter for fracture with routine healing: Secondary | ICD-10-CM | POA: Diagnosis not present

## 2022-03-23 DIAGNOSIS — S0511XD Contusion of eyeball and orbital tissues, right eye, subsequent encounter: Secondary | ICD-10-CM | POA: Diagnosis not present

## 2022-03-23 DIAGNOSIS — I1310 Hypertensive heart and chronic kidney disease without heart failure, with stage 1 through stage 4 chronic kidney disease, or unspecified chronic kidney disease: Secondary | ICD-10-CM | POA: Diagnosis not present

## 2022-03-23 DIAGNOSIS — Z952 Presence of prosthetic heart valve: Secondary | ICD-10-CM | POA: Diagnosis not present

## 2022-03-23 DIAGNOSIS — Z556 Problems related to health literacy: Secondary | ICD-10-CM | POA: Diagnosis not present

## 2022-03-23 DIAGNOSIS — I69351 Hemiplegia and hemiparesis following cerebral infarction affecting right dominant side: Secondary | ICD-10-CM | POA: Diagnosis not present

## 2022-03-23 DIAGNOSIS — Z794 Long term (current) use of insulin: Secondary | ICD-10-CM | POA: Diagnosis not present

## 2022-03-23 DIAGNOSIS — E1140 Type 2 diabetes mellitus with diabetic neuropathy, unspecified: Secondary | ICD-10-CM | POA: Diagnosis not present

## 2022-03-23 DIAGNOSIS — I70234 Atherosclerosis of native arteries of right leg with ulceration of heel and midfoot: Secondary | ICD-10-CM

## 2022-03-23 DIAGNOSIS — Z9862 Peripheral vascular angioplasty status: Secondary | ICD-10-CM

## 2022-03-23 DIAGNOSIS — K5909 Other constipation: Secondary | ICD-10-CM | POA: Diagnosis not present

## 2022-03-23 DIAGNOSIS — S42301D Unspecified fracture of shaft of humerus, right arm, subsequent encounter for fracture with routine healing: Secondary | ICD-10-CM | POA: Diagnosis not present

## 2022-03-23 DIAGNOSIS — I6522 Occlusion and stenosis of left carotid artery: Secondary | ICD-10-CM | POA: Diagnosis not present

## 2022-03-23 DIAGNOSIS — E1122 Type 2 diabetes mellitus with diabetic chronic kidney disease: Secondary | ICD-10-CM | POA: Diagnosis not present

## 2022-03-23 DIAGNOSIS — N1831 Chronic kidney disease, stage 3a: Secondary | ICD-10-CM | POA: Diagnosis not present

## 2022-03-23 DIAGNOSIS — K219 Gastro-esophageal reflux disease without esophagitis: Secondary | ICD-10-CM | POA: Diagnosis not present

## 2022-03-23 DIAGNOSIS — Z86718 Personal history of other venous thrombosis and embolism: Secondary | ICD-10-CM | POA: Diagnosis not present

## 2022-03-23 DIAGNOSIS — Z9889 Other specified postprocedural states: Secondary | ICD-10-CM | POA: Diagnosis not present

## 2022-03-23 DIAGNOSIS — I4891 Unspecified atrial fibrillation: Secondary | ICD-10-CM | POA: Diagnosis not present

## 2022-03-23 DIAGNOSIS — E1165 Type 2 diabetes mellitus with hyperglycemia: Secondary | ICD-10-CM | POA: Diagnosis not present

## 2022-03-23 DIAGNOSIS — I2510 Atherosclerotic heart disease of native coronary artery without angina pectoris: Secondary | ICD-10-CM | POA: Diagnosis not present

## 2022-03-23 DIAGNOSIS — Z853 Personal history of malignant neoplasm of breast: Secondary | ICD-10-CM | POA: Diagnosis not present

## 2022-03-23 DIAGNOSIS — F32A Depression, unspecified: Secondary | ICD-10-CM | POA: Diagnosis not present

## 2022-03-23 DIAGNOSIS — Z7901 Long term (current) use of anticoagulants: Secondary | ICD-10-CM | POA: Diagnosis not present

## 2022-03-23 DIAGNOSIS — E875 Hyperkalemia: Secondary | ICD-10-CM | POA: Diagnosis not present

## 2022-03-24 DIAGNOSIS — E1122 Type 2 diabetes mellitus with diabetic chronic kidney disease: Secondary | ICD-10-CM | POA: Diagnosis not present

## 2022-03-24 DIAGNOSIS — I4891 Unspecified atrial fibrillation: Secondary | ICD-10-CM | POA: Diagnosis not present

## 2022-03-24 DIAGNOSIS — Z7901 Long term (current) use of anticoagulants: Secondary | ICD-10-CM | POA: Diagnosis not present

## 2022-03-24 DIAGNOSIS — K219 Gastro-esophageal reflux disease without esophagitis: Secondary | ICD-10-CM | POA: Diagnosis not present

## 2022-03-24 DIAGNOSIS — Z952 Presence of prosthetic heart valve: Secondary | ICD-10-CM | POA: Diagnosis not present

## 2022-03-24 DIAGNOSIS — I2510 Atherosclerotic heart disease of native coronary artery without angina pectoris: Secondary | ICD-10-CM | POA: Diagnosis not present

## 2022-03-24 DIAGNOSIS — E875 Hyperkalemia: Secondary | ICD-10-CM | POA: Diagnosis not present

## 2022-03-24 DIAGNOSIS — F32A Depression, unspecified: Secondary | ICD-10-CM | POA: Diagnosis not present

## 2022-03-24 DIAGNOSIS — E1165 Type 2 diabetes mellitus with hyperglycemia: Secondary | ICD-10-CM | POA: Diagnosis not present

## 2022-03-24 DIAGNOSIS — Z853 Personal history of malignant neoplasm of breast: Secondary | ICD-10-CM | POA: Diagnosis not present

## 2022-03-24 DIAGNOSIS — Z794 Long term (current) use of insulin: Secondary | ICD-10-CM | POA: Diagnosis not present

## 2022-03-24 DIAGNOSIS — Z9889 Other specified postprocedural states: Secondary | ICD-10-CM | POA: Diagnosis not present

## 2022-03-24 DIAGNOSIS — N1831 Chronic kidney disease, stage 3a: Secondary | ICD-10-CM | POA: Diagnosis not present

## 2022-03-24 DIAGNOSIS — S32501D Unspecified fracture of right pubis, subsequent encounter for fracture with routine healing: Secondary | ICD-10-CM | POA: Diagnosis not present

## 2022-03-24 DIAGNOSIS — I69351 Hemiplegia and hemiparesis following cerebral infarction affecting right dominant side: Secondary | ICD-10-CM | POA: Diagnosis not present

## 2022-03-24 DIAGNOSIS — E1140 Type 2 diabetes mellitus with diabetic neuropathy, unspecified: Secondary | ICD-10-CM | POA: Diagnosis not present

## 2022-03-24 DIAGNOSIS — S0511XD Contusion of eyeball and orbital tissues, right eye, subsequent encounter: Secondary | ICD-10-CM | POA: Diagnosis not present

## 2022-03-24 DIAGNOSIS — I1310 Hypertensive heart and chronic kidney disease without heart failure, with stage 1 through stage 4 chronic kidney disease, or unspecified chronic kidney disease: Secondary | ICD-10-CM | POA: Diagnosis not present

## 2022-03-24 DIAGNOSIS — Z556 Problems related to health literacy: Secondary | ICD-10-CM | POA: Diagnosis not present

## 2022-03-24 DIAGNOSIS — K5909 Other constipation: Secondary | ICD-10-CM | POA: Diagnosis not present

## 2022-03-24 DIAGNOSIS — S42301D Unspecified fracture of shaft of humerus, right arm, subsequent encounter for fracture with routine healing: Secondary | ICD-10-CM | POA: Diagnosis not present

## 2022-03-24 DIAGNOSIS — I6522 Occlusion and stenosis of left carotid artery: Secondary | ICD-10-CM | POA: Diagnosis not present

## 2022-03-24 DIAGNOSIS — Z86718 Personal history of other venous thrombosis and embolism: Secondary | ICD-10-CM | POA: Diagnosis not present

## 2022-03-24 DIAGNOSIS — M1990 Unspecified osteoarthritis, unspecified site: Secondary | ICD-10-CM | POA: Diagnosis not present

## 2022-04-15 ENCOUNTER — Ambulatory Visit (HOSPITAL_COMMUNITY): Payer: Medicare Other

## 2022-04-15 ENCOUNTER — Ambulatory Visit: Payer: Medicare Other

## 2022-04-22 ENCOUNTER — Other Ambulatory Visit (HOSPITAL_COMMUNITY): Payer: Medicare Other

## 2022-04-22 ENCOUNTER — Ambulatory Visit: Payer: Medicare Other

## 2022-04-22 ENCOUNTER — Encounter (HOSPITAL_COMMUNITY): Payer: Medicare Other

## 2022-05-02 ENCOUNTER — Ambulatory Visit: Payer: Medicare Other | Admitting: Cardiovascular Disease

## 2022-05-02 NOTE — Progress Notes (Deleted)
No chief complaint on file.  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 I saw her as a new patient for evaluation of atrial fibrillation and severe MR on 07/03/12. She was diagnosed with atrial fibrillation April of 2014 as well as severe MR and TR. She had a TEE guided cardioversion in May 2016 with restoration of sinus rhythm. She was felt to be symptomatic from her mitral valve disease. Cardiac cath on 09/05/12 with mild CAD. She underwent placement of mitral valve ring and tricuspid valve ring as well as MAZE procedure per Dr. Roxy Manns on 10/11/12.  Echo 11/23/18 with LVEF over 65%. Moderate LVH. Stable mitral and tricuspid repair.Severe aortic stenosis noted. Cardiac cath September 2020 with mild to moderate non-obstructive CAD. She underwent successful TAVR with a 23 mm Edwards Sapien 3 THV via the TF approach on 01/22/19. Post operative echo showed EF 65-70%, normally functioning TAVR with mean gradient of 13 mm Hg and mild PVL. Unfortunately, her TAVR was complicated by an extensive right groin hematoma requiring surgical repair and blood transfusions as well as cardioembolic CVA. MRA showed multifocal acute ischemia scattered within both hemispheres, left-greater-than-right, and in the left cerebellum most consistent with central embolic source. She was restarted on Xarelto. She was readmitted to [facility] from 11/9-11/12/20 with groin pain and recurrent aphasia. Her Hg was noted to be down to 7.4 and she was transfused. CT abdomen pelvis showed a right inguinal hematoma and small right pelvic hematoma. CT head showed no acute abnormality but MRI brain showed several small acute cortical-based infarcts in the left frontal, parietal and occipital lobes which may  represent an embolic source.  CT angio of the head and neck showed severe narrowing of the left middle cerebral artery and moderate to severe narrowing of the left posterior cerebral artery.  She was evaluated by neurology and underwent full stroke work-up. Her stroke was felt to be most likely related to her intracranial atherosclerotic disease. Cardiac monitor March 2021 with sinus with PACs. She has since undergone treatment of a left MCA stenosis. Echo November 2021 with normal LV function, normally functioning AVR with trivial PVL. PAD followed in [facility] by Dr. Unk Lightning. Recent right LE angioplasty/stenting of the right SFA in the setting of critical limb ischemia.   She is here today for follow up. The patient denies any chest pain, dyspnea, palpitations, lower extremity edema, orthopnea, PND, dizziness, near syncope or syncope.   Primary Care Physician: Lajean Manes, MD  Past Medical History:  Diagnosis Date   Acute CVA (cerebrovascular accident) ([place]) 01/24/2019   Anxiety    Arthritis    knees   Atrial fibrillation (HCC)    CAD (coronary artery disease)    a. pre TAVR cath 2020 mild nonobstructive CAD in the LAD and CX with moderate nononstructive disease in the mid-distal RCA and PDA.   Cancer ([place])    left breast   Cataracts, bilateral    CKD (chronic kidney disease), stage III (HCC)    CVA (cerebral vascular accident) ([place])    Depression    Diabetes mellitus    DVT (deep venous thrombosis) (HCC)    hx of   Dyspnea    with exertion   Full dentures    GERD (gastroesophageal reflux disease)  Groin hematoma    Hearing aid worn    B/L   History of TIA (transient ischemic attack)    mini stroke with vision problems   Hypercholesterolemia    Hypertension    Hypertensive nephropathy    MCI (mild cognitive impairment)    Memory loss    Middle cerebral artery stenosis, left    Mitral regurgitation    Paroxysmal atrial fibrillation (HCC)    Pneumonia    PONV (postoperative  nausea and vomiting)    Premature atrial contraction    S/P Maze operation for atrial fibrillation 10/11/2012   Complete bilateral atrial lesion set using bipolar radiofrequency and cryothermy ablation with clipping of LA appendage   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   87m Sorin Memo 3D ring annuloplasty with 26 mm Edwards mc3 tricuspid ring annuloplasty    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   s/p TAVR with a 23 Edwards Sapien 3 Ultra THV via the TF approach   Thyroid nodule    Tricuspid regurgitation    Wears glasses     Past Surgical History:  Procedure Laterality Date   ABDOMINAL AORTOGRAM W/LOWER EXTREMITY N/A 03/14/2022   Procedure: ABDOMINAL AORTOGRAM W/LOWER EXTREMITY;  Surgeon: HCherre Robins MD;  [facility];  Service: Cardiovascular;  Laterality: N/A;   ARTERY REPAIR Right 01/22/2019   Procedure: Right Common Femoral Artery Repair;  Surgeon: DAngelia Mould MD;  [facility]  Service: Vascular;  Laterality: Right;   CARDIAC CATHETERIZATION  09-05-12   CARDIOVERSION N/A 07/31/2012   Procedure: CARDIOVERSION;  Surgeon: PJosue Hector MD;  [facility]  Service: Cardiovascular;  Laterality: N/A;   CATARACT EXTRACTION W/ INTRAOCULAR LENS  IMPLANT, BILATERAL     CESAREAN SECTION     x 2   COLONOSCOPY WITH PROPOFOL N/A 11/28/2014   Procedure: COLONOSCOPY WITH PROPOFOL;  Surgeon: PCarol Ada MD;  [facility];  Service: Endoscopy;  Laterality: N/A;   COLONOSCOPY WITH PROPOFOL N/A 01/22/2021   Procedure: COLONOSCOPY WITH PROPOFOL;  Surgeon: HCarol Ada MD;  [facility];  Service: Endoscopy;  Laterality: N/A;   DILATION AND CURETTAGE OF UTERUS     FEMORAL ARTERY EXPLORATION Right 01/22/2019   Procedure: RIGHT GROIN EXPLORATION with evacuation of hematoma;  Surgeon: DAngelia Mould MD;  [place] [facility]  Service: Vascular;  Laterality: Right;   FLEXIBLE SIGMOIDOSCOPY N/A 12/18/2015    Procedure: FLEXIBLE SIGMOIDOSCOPY;  Surgeon: PCarol Ada MD;  [facility];  Service: Endoscopy;  Laterality: N/A;   INTRAOPERATIVE TRANSESOPHAGEAL ECHOCARDIOGRAM N/A 10/11/2012   Procedure: INTRAOPERATIVE TRANSESOPHAGEAL ECHOCARDIOGRAM;  Surgeon: CRexene Alberts MD;  [facility]  Service: Open Heart Surgery;  Laterality: N/A;   IR ANGIO INTRA EXTRACRAN SEL COM CAROTID INNOMINATE BILAT MOD SED  05/21/2019   IR ANGIO VERTEBRAL SEL VERTEBRAL BILAT MOD SED  05/21/2019   MASTECTOMY Left 1982   MAZE N/A 10/11/2012   Procedure: MAZE;  Surgeon: CRexene Alberts MD;  [facility]  Service: Open Heart Surgery;  Laterality: N/A;   MITRAL VALVE REPAIR N/A 10/11/2012   Procedure: MITRAL VALVE REPAIR (MVR);  Surgeon: CRexene Alberts MD;  [facility]  Service: Open Heart Surgery;  Laterality: N/A;   MULTIPLE TOOTH EXTRACTIONS     PERIPHERAL VASCULAR BALLOON ANGIOPLASTY Right 03/14/2022   Procedure: PERIPHERAL VASCULAR BALLOON ANGIOPLASTY;  Surgeon: HCherre Robins MD;  [facility];  Service: Cardiovascular;  Laterality: Right;  Peroneal  PERIPHERAL VASCULAR INTERVENTION Right 03/14/2022   Procedure: PERIPHERAL VASCULAR INTERVENTION;  Surgeon: Cherre Robins, MD;  Location: [facility];  Service: Cardiovascular;  Laterality: Right;  SFA   RADIOLOGY WITH ANESTHESIA N/A 06/19/2019   Procedure: STENTING;  Surgeon: Luanne Bras, MD;  Location: [facility];  Service: Radiology;  Laterality: N/A;   RIGHT HEART CATH AND CORONARY ANGIOGRAPHY N/A 12/24/2018   Procedure: RIGHT HEART CATH AND CORONARY ANGIOGRAPHY;  Surgeon: [name], MD;  Location: [facility];  Service: Cardiovascular;  Laterality: N/A;   TEE WITHOUT CARDIOVERSION N/A 07/31/2012   Procedure: TRANSESOPHAGEAL ECHOCARDIOGRAM (TEE);  Surgeon: Josue Hector, MD;  Location: [facility];  Service: Cardiovascular;  Laterality: N/A;   TEE WITHOUT CARDIOVERSION N/A 01/22/2019   Procedure:  TRANSESOPHAGEAL ECHOCARDIOGRAM (TEE);  Surgeon: [name], MD;  Location: [facility];  Service: Open Heart Surgery;  Laterality: N/A;   TRANSCATHETER AORTIC VALVE REPLACEMENT, TRANSFEMORAL N/A 01/22/2019   Procedure: TRANSCATHETER AORTIC VALVE REPLACEMENT, TRANSFEMORAL;  Surgeon: [name], MD;  Location: [facility];  Service: Open Heart Surgery;  Laterality: N/A;   TRICUSPID VALVE REPLACEMENT N/A 10/11/2012   Procedure: TRICUSPID VALVE REPAIR;  Surgeon: Rexene Alberts, MD;  Location: [facility];  Service: Open Heart Surgery;  Laterality: N/A;   TUBAL LIGATION      Current Outpatient Medications  Medication Sig Dispense Refill   acetaminophen (TYLENOL) 500 MG tablet Take 1,000 mg by mouth every 6 (six) hours as needed for moderate pain or headache.     apixaban (ELIQUIS) 2.5 MG TABS tablet Take 1 tablet (2.5 mg total) by mouth 2 (two) times daily. 60 tablet 5   atorvastatin (LIPITOR) 80 MG tablet Take 1 tablet by mouth once daily 30 tablet 7   Calcium Carb-Cholecalciferol (CALCIUM 600 + D PO) Take 1 tablet by mouth 2 (two) times daily.     clopidogrel (PLAVIX) 75 MG tablet Take 1 tablet (75 mg total) by mouth daily. 30 tablet 11   dapagliflozin propanediol (FARXIGA) 10 MG TABS tablet Take 10 mg by mouth daily.     docusate sodium (COLACE) 100 MG capsule Take 100-200 mg by mouth daily as needed for mild constipation.     insulin detemir (LEVEMIR) 100 UNIT/ML injection Inject 0.28 mLs (28 Units total) into the skin 2 (two) times daily. 10 mL 11   lidocaine (LIDODERM) 5 % Place 1 patch onto the skin daily. Remove & Discard patch within 12 hours or as directed by MD 30 patch 0   Multiple Vitamins-Minerals (MULTIVITAMIN WITH MINERALS) tablet Take 1 tablet by mouth daily.     nitroGLYCERIN (NITROSTAT) 0.4 MG SL tablet Place 1 tablet (0.4 mg total) under the tongue every 5 (five) minutes as needed for chest pain. 25 tablet 3   omeprazole (PRILOSEC) 40 MG capsule  Take 40 mg by mouth daily before breakfast.      ondansetron (ZOFRAN) 4 MG tablet Take 4 mg by mouth every 8 (eight) hours as needed for nausea or vomiting.     polyethylene glycol powder (MIRALAX) 17 GM/SCOOP powder Take 17 g by mouth 2 (two) times daily as needed for moderate constipation or mild constipation. 255 g 2   venlafaxine XR (EFFEXOR-XR) 150 MG 24 hr capsule Take 150 mg by mouth daily with breakfast. Take with 75 mg capsule to equal 225 mg daily     venlafaxine XR (EFFEXOR-XR) 75 MG 24 hr capsule Take 75 mg by mouth daily with  breakfast. Take with 150 mg capsule to equal 225 mg daily     vitamin B-12 (CYANOCOBALAMIN) 500 MCG tablet Take 500 mcg by mouth every Monday.     No current facility-administered medications for this visit.    Allergies  Allergen Reactions   Buspirone Hcl     lethargic/tired   Metformin And Related Diarrhea   Ozempic (0.25 Or 0.5 Mg-Dose) [Semaglutide(0.25 Or 0.'5mg'$ -Dos)]     Other reaction(s): nausea   Ultram [Tramadol] Nausea And Vomiting   Nickel Rash    Pt unable to wear jewelry made of nickel.   Pneumococcal Vaccines Swelling and Rash    At injection set    Social History   Socioeconomic History   Marital status: Widowed    Spouse name: Not on file   Number of children: 3   Years of education: Not on file   Highest education level: High school graduate  Occupational History   Occupation: Air cabin crew  Tobacco Use   Smoking status: Unknown    Passive exposure: Never   Smokeless tobacco: Never  Vaping Use   Vaping Use: Never used  Substance and Sexual Activity   Alcohol use: No   Drug use: No   Sexual activity: Never  Other Topics Concern   Not on file  Social History Narrative   04/19/21 lives alone, dgtr lives close by   Social Determinants of Health   Financial Resource Strain: Not on file  Food Insecurity: No Food Insecurity (12/20/2021)   Hunger Vital Sign    Worried About Running Out of Food in  the Last Year: Never true    [place] in the Last Year: Never true  Transportation Needs: No Transportation Needs (12/20/2021)   PRAPARE - Hydrologist (Medical): No    Lack of Transportation (Non-Medical): No  Physical Activity: Not on file  Stress: Not on file  Social Connections: Not on file  Intimate Partner Violence: Not At Risk (12/20/2021)   Humiliation, Afraid, Rape, and Kick questionnaire    Fear of Current or Ex-Partner: No    Emotionally Abused: No    Physically Abused: No    Sexually Abused: No    Family History  Problem Relation Age of Onset   Alcohol abuse Mother    Cancer Father        prostate   Hypertension Father    Heart attack Paternal Grandfather    Stroke Neg Hx     Review of Systems:  As stated in the HPI and otherwise negative.   There were no vitals taken for this visit.  Physical Examination:  General: Well developed, well nourished, NAD  HEENT: OP clear, mucus membranes moist  SKIN: warm, dry. No rashes. Neuro: No focal deficits  Musculoskeletal: Muscle strength 5/5 all ext  Psychiatric: Mood and affect normal  Neck: No JVD, no carotid bruits, no thyromegaly, no lymphadenopathy.  Lungs:Clear bilaterally, no wheezes, rhonci, crackles Cardiovascular: Regular rate and rhythm. No murmurs, gallops or rubs. Abdomen:Soft. Bowel sounds present. Non-tender.  Extremities: No lower extremity edema. Pulses are 2 + in the bilateral DP/PT.  Echo November 2021:  1. Left ventricular ejection fraction, by estimation, is 60 to 65%. The  left ventricle has normal function. The left ventricle has no regional  wall motion abnormalities. There is mild left ventricular hypertrophy.  Left ventricular diastolic parameters  are consistent with Grade II diastolic dysfunction (pseudonormalization).   2. Right ventricular systolic function is normal.  The right ventricular  size is normal. Tricuspid regurgitation signal is inadequate  for assessing  PA pressure.   3. Left atrial size was moderately dilated.   4. Status post tricuspid valve repair. Trivial tricuspid regurgitation.  No significant stenosis with mean gradient 2 mmHg.   5. Status post mitral valve repair. Trivial mitral regurgitation. Mean  gradient 4 mmHg, mild mitral stenosis.   6. Bioprosthetic aortic valve s/p TAVR (23 mm Edwards Sapien). Trivial  peri-valvular leakage. Mean gradient 16 mmHg, mildly elevated.   7. The inferior vena cava is normal in size with greater than 50%  respiratory variability, suggesting right atrial pressure of 3 mmHg.    EKG:  EKG is *** ordered today. The ekg ordered today demonstrates   Recent Labs: 10/18/2021: Magnesium 2.0 12/20/2021: ALT 24 12/21/2021: Platelets 143 03/14/2022: BUN 32; Creatinine, Ser 0.70; Hemoglobin 15.3; Potassium 4.5; Sodium 140   Lipid Panel    Component Value Date/Time   CHOL 112 01/23/2019 1530   TRIG 149 01/23/2019 1530   HDL 38 (L) 01/23/2019 1530   CHOLHDL 2.9 01/23/2019 1530   VLDL 30 01/23/2019 1530   LDLCALC 44 01/23/2019 1530     Wt Readings from Last 3 Encounters:  03/18/22 62.1 kg  03/14/22 66.2 kg  03/11/22 66.2 kg     Assessment and Plan:   1. Severe Aortic stenosis s/p TAVR: Doing well post TAVR. Trivial PVL by echo November 2021. Continue Eliquis  2. Mitral regurgitation: She is s/p MV ring in 2014, stable by echo 2021  3. Tricuspid regurgitation: She is s/p TV ring in 2014. Stable by echo in 2021  4. CAD without angina: Mild CAD by cath in 2020. No chest pain suggestive of angina. Continue Plavix and Lipitor.   5. HTN: BP is well controlled. No changes  6. Atrial fibrillation, paroxysmal: Sinus today on exam. Continue  Eliquis.   7. Palpitations: Cardiac monitor March 2021 with sinus with PACs.   8. PAD: Recent right LE angioplasty/stenting. Followed in [facility] by Dr. Unk Lightning   Labs/ tests ordered today include:  No orders of the defined types were placed in  this encounter.  Disposition:   F/U with me one year  Signed, Lauree Chandler, MD 05/02/2022 6:46 AM    [facility] [address], [place], [place]  [address] Phone: [phone]; Fax: [phone]

## 2022-05-06 ENCOUNTER — Ambulatory Visit (HOSPITAL_COMMUNITY): Admission: RE | Admit: 2022-05-06 | Payer: Medicare Other | Source: Ambulatory Visit

## 2022-05-06 ENCOUNTER — Ambulatory Visit (HOSPITAL_COMMUNITY): Payer: Medicare Other

## 2022-05-06 ENCOUNTER — Ambulatory Visit: Payer: Medicare Other

## 2022-05-23 ENCOUNTER — Emergency Department (EMERGENCY_DEPARTMENT_HOSPITAL)
Admission: EM | Admit: 2022-05-23 | Discharge: 2022-05-24 | Disposition: A | Discharge status: Home / Self Care | Payer: Medicare Other | Source: Home / Self Care | Attending: Emergency Medicine | Admitting: Emergency Medicine

## 2022-05-23 ENCOUNTER — Encounter (HOSPITAL_COMMUNITY): Payer: Self-pay

## 2022-05-23 ENCOUNTER — Other Ambulatory Visit: Payer: Self-pay

## 2022-05-23 ENCOUNTER — Emergency Department (HOSPITAL_COMMUNITY): Payer: Medicare Other

## 2022-05-23 DIAGNOSIS — S72141A Displaced intertrochanteric fracture of right femur, initial encounter for closed fracture: Secondary | ICD-10-CM | POA: Diagnosis not present

## 2022-05-23 DIAGNOSIS — R4781 Slurred speech: Secondary | ICD-10-CM

## 2022-05-23 DIAGNOSIS — R531 Weakness: Secondary | ICD-10-CM | POA: Diagnosis not present

## 2022-05-23 DIAGNOSIS — R4701 Aphasia: Secondary | ICD-10-CM | POA: Insufficient documentation

## 2022-05-23 LAB — I-STAT CHEM 8, ED
BUN: 27 mg/dL — ABNORMAL HIGH (ref 8–23)
Calcium, Ion: 1.07 mmol/L — ABNORMAL LOW (ref 1.15–1.40)
Chloride: 106 mmol/L (ref 98–111)
Creatinine, Ser: 0.7 mg/dL (ref 0.44–1.00)
Glucose, Bld: 99 mg/dL (ref 70–99)
HCT: 41 % (ref 36.0–46.0)
Hemoglobin: 13.9 g/dL (ref 12.0–15.0)
Potassium: 3.7 mmol/L (ref 3.5–5.1)
Sodium: 140 mmol/L (ref 135–145)
TCO2: 24 mmol/L (ref 22–32)

## 2022-05-23 LAB — COMPREHENSIVE METABOLIC PANEL
ALT: 35 U/L (ref 0–44)
AST: 31 U/L (ref 15–41)
Albumin: 3.6 g/dL (ref 3.5–5.0)
Alkaline Phosphatase: 72 U/L (ref 38–126)
Anion gap: 12 (ref 5–15)
BUN: 27 mg/dL — ABNORMAL HIGH (ref 8–23)
CO2: 23 mmol/L (ref 22–32)
Calcium: 9.1 mg/dL (ref 8.9–10.3)
Chloride: 105 mmol/L (ref 98–111)
Creatinine, Ser: 0.8 mg/dL (ref 0.44–1.00)
GFR, Estimated: >60 mL/min (ref >60)
Glucose, Bld: 99 mg/dL (ref 70–99)
Potassium: 3.7 mmol/L (ref 3.5–5.1)
Sodium: 140 mmol/L (ref 135–145)
Total Bilirubin: 0.5 mg/dL (ref 0.3–1.2)
Total Protein: 6.7 g/dL (ref 6.5–8.1)

## 2022-05-23 LAB — CBC
HCT: 44.5 % (ref 36.0–46.0)
Hemoglobin: 14.3 g/dL (ref 12.0–15.0)
MCH: 29.5 pg (ref 26.0–34.0)
MCHC: 32.1 g/dL (ref 30.0–36.0)
MCV: 91.9 fL (ref 80.0–100.0)
Platelets: 198 10*3/uL (ref 150–400)
RBC: 4.84 MIL/uL (ref 3.87–5.11)
RDW: 13.6 % (ref 11.5–15.5)
WBC: 7.9 10*3/uL (ref 4.0–10.5)
nRBC: 0 % (ref 0.0–0.2)

## 2022-05-23 LAB — APTT: aPTT: 28 seconds (ref 24–36)

## 2022-05-23 LAB — CBG MONITORING, ED: Glucose-Capillary: 86 mg/dL (ref 70–99)

## 2022-05-23 LAB — DIFFERENTIAL
Abs Immature Granulocytes: 0.02 10*3/uL (ref 0.00–0.07)
Basophils Absolute: 0 10*3/uL (ref 0.0–0.1)
Basophils Relative: 0 %
Eosinophils Absolute: 0.1 10*3/uL (ref 0.0–0.5)
Eosinophils Relative: 1 %
Immature Granulocytes: 0 %
Lymphocytes Relative: 29 %
Lymphs Abs: 2.3 10*3/uL (ref 0.7–4.0)
Monocytes Absolute: 0.8 10*3/uL (ref 0.1–1.0)
Monocytes Relative: 10 %
Neutro Abs: 4.7 10*3/uL (ref 1.7–7.7)
Neutrophils Relative %: 60 %

## 2022-05-23 LAB — ETHANOL: Alcohol, Ethyl (B): <10 mg/dL (ref <10)

## 2022-05-23 LAB — PROTIME-INR
INR: 1.1 (ref 0.8–1.2)
Prothrombin Time: 14.2 seconds (ref 11.4–15.2)

## 2022-05-23 MED ORDER — IOHEXOL 350 MG/ML SOLN
75.0000 mL | Freq: Once | INTRAVENOUS | Status: AC | PRN
  Administered 2022-05-23: 75 mL via INTRAVENOUS

## 2022-05-23 NOTE — Discharge Instructions (Addendum)
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

## 2022-05-23 NOTE — ED Triage Notes (Signed)
Pt arrived from home via [facility] w c/o talking "googligoo" per pt. The aphasia happened at 1430 pt states cleared up. LKW 1429. No issues since. Speech still appears to be slightly slurred.

## 2022-05-23 NOTE — ED Provider Notes (Signed)
[facility] Provider Note   CSN: [id number] Arrival date & time: 05/23/22  1929     History  Chief Complaint  Patient presents with   Aphasia    Rachel Santos is a 82 y.o. female history of previous TIA, limb ischemia, right groin pseudoaneurysm, here presenting with trouble speaking.  Patient had trouble speaking around 2:30 PM.  Patient states that she could not get words out.  She denies any focal weakness.  She states that her speech is much better now.  Patient had a stroke in 2022.  Patient is on Eliquis.  The history is provided by the patient.       Home Medications Prior to Admission medications   Medication Sig Start Date End Date Taking? Authorizing Provider  acetaminophen (TYLENOL) 500 MG tablet Take 1,000 mg by mouth every 6 (six) hours as needed for moderate pain or headache.    [provider]  apixaban (ELIQUIS) 2.5 MG TABS tablet Take 1 tablet (2.5 mg total) by mouth 2 (two) times daily. 02/28/22   [name], MD  atorvastatin (LIPITOR) 80 MG tablet Take 1 tablet by mouth once daily 10/06/21   [name], MD  Calcium Carb-Cholecalciferol (CALCIUM 600 + D PO) Take 1 tablet by mouth 2 (two) times daily.    [provider]  clopidogrel (PLAVIX) 75 MG tablet Take 1 tablet (75 mg total) by mouth daily. 03/14/22 03/14/23  Cherre Robins, MD  dapagliflozin propanediol (FARXIGA) 10 MG TABS tablet Take 10 mg by mouth daily. 03/03/20   [provider]  docusate sodium (COLACE) 100 MG capsule Take 100-200 mg by mouth daily as needed for mild constipation.    [provider]  insulin detemir (LEVEMIR) 100 UNIT/ML injection Inject 0.28 mLs (28 Units total) into the skin 2 (two) times daily. 12/24/21   [place] Eric J, DO  lidocaine (LIDODERM) 5 % Place 1 patch onto the skin daily. Remove & Discard patch within 12 hours or as directed by MD 12/24/21   [place] Donnamarie Poag, DO   Multiple Vitamins-Minerals (MULTIVITAMIN WITH MINERALS) tablet Take 1 tablet by mouth daily.    [provider]  nitroGLYCERIN (NITROSTAT) 0.4 MG SL tablet Place 1 tablet (0.4 mg total) under the tongue every 5 (five) minutes as needed for chest pain. 09/08/20 10/17/22  Richardson Dopp T, PA-C  omeprazole (PRILOSEC) 40 MG capsule Take 40 mg by mouth daily before breakfast.  06/28/12   [provider]  ondansetron (ZOFRAN) 4 MG tablet Take 4 mg by mouth every 8 (eight) hours as needed for nausea or vomiting. 12/14/21   [provider]  polyethylene glycol powder (MIRALAX) 17 GM/SCOOP powder Take 17 g by mouth 2 (two) times daily as needed for moderate constipation or mild constipation. 10/20/21   Mercy Riding, MD  venlafaxine XR (EFFEXOR-XR) 150 MG 24 hr capsule Take 150 mg by mouth daily with breakfast. Take with 75 mg capsule to equal 225 mg daily    [provider]  venlafaxine XR (EFFEXOR-XR) 75 MG 24 hr capsule Take 75 mg by mouth daily with breakfast. Take with 150 mg capsule to equal 225 mg daily    [provider]  vitamin B-12 (CYANOCOBALAMIN) 500 MCG tablet Take 500 mcg by mouth every Monday.    [provider]      Allergies    Buspirone hcl, Metformin and related, Ozempic (0.25 or 0.5 mg-dose) [semaglutide(0.25 or 0.'5mg'$ -dos)], Ultram [  tramadol], Nickel, and Pneumococcal vaccines    Review of Systems   Review of Systems  Neurological:  Positive for speech difficulty.  All other systems reviewed and are negative.   Physical Exam Updated Vital Signs BP (!) 120/104   Pulse 75   Temp 98.6 F (37 C)   Resp 16   Ht '5\' 7"'$  (1.702 m)   Wt 66.2 kg   SpO2 95%   BMI 22.87 kg/m  Physical Exam Vitals and nursing note reviewed.  Constitutional:      Comments: Chronically ill, no obvious aphasia.  Questionable mild slurred speech  HENT:     Head: Normocephalic.     Nose: Nose normal.     Mouth/Throat:     Mouth: Mucous membranes are  moist.  Eyes:     Extraocular Movements: Extraocular movements intact.     Pupils: Pupils are equal, round, and reactive to light.  Cardiovascular:     Rate and Rhythm: Normal rate and regular rhythm.     Pulses: Normal pulses.     Heart sounds: Normal heart sounds.  Pulmonary:     Effort: Pulmonary effort is normal.     Breath sounds: Normal breath sounds.  Abdominal:     General: Abdomen is flat.     Palpations: Abdomen is soft.  Musculoskeletal:        General: Normal range of motion.     Cervical back: Normal range of motion and neck supple.  Skin:    General: Skin is warm.     Capillary Refill: Capillary refill takes less than 2 seconds.  Neurological:     Comments: ?  Left facial droop and mild slurred speech.  No obvious visual deficit.  Normal strength bilateral arms and legs  Psychiatric:        Behavior: Behavior normal.     ED Results / Procedures / Treatments   Labs (all labs ordered are listed, but only abnormal results are displayed) Labs Reviewed  I-STAT CHEM 8, ED - Abnormal; Notable for the following components:      Result Value   BUN 27 (*)    Calcium, Ion 1.07 (*)    All other components within normal limits  ETHANOL  PROTIME-INR  APTT  CBC  DIFFERENTIAL  COMPREHENSIVE METABOLIC PANEL  RAPID URINE DRUG SCREEN, HOSP PERFORMED    EKG EKG Interpretation  Date/Time:  Monday May 23 2022 20:23:15 EST Ventricular Rate:  66 PR Interval:  81 QRS Duration: 108 QT Interval:  433 QTC Calculation: 454 R Axis:   -17 Text Interpretation: Sinus or ectopic atrial rhythm Short PR interval Left ventricular hypertrophy Borderline T abnormalities, diffuse leads No significant change since last tracing Confirmed by Wandra Arthurs [phone]) on 05/23/2022 8:44:56 PM  Radiology No results found.  Procedures Procedures    Medications Ordered in ED Medications - No data to display  ED Course/ Medical Decision Making/ A&P                              Medical Decision Making Rachel Santos is a 82 y.o. female here presenting with slurred speech and expressive aphasia.  I discussed case with Dr. Regis Bill from neurology on patient arrival.  He states that patient is not a tPA candidate.  She does not meet pain criteria so will not activate a code stroke right now.  He recommends CTA head and neck and MRI brain.  11:34 PM  CTA showed no obvious LVO. Dr. Regis Bill saw patient and recommend MRI brain and if negative patient can get outpatient EEG with [facility] neurology.  Patient has no known seizure disorder so he wants to hold off on antiepileptic right now and patient does not drive.  11:55 PM Signed out to Dr. Christy Gentles to follow up MRI result  Amount and/or Complexity of Data Reviewed Labs: ordered. Decision-making details documented in ED Course. Radiology: ordered and independent interpretation performed. Decision-making details documented in ED Course. ECG/medicine tests: ordered and independent interpretation performed. Decision-making details documented in ED Course.  Risk Prescription drug management.    Final Clinical Impression(s) / ED Diagnoses Final diagnoses:  None    Rx / DC Orders ED Discharge Orders     None         Drenda Freeze, MD 05/23/22 2355

## 2022-05-23 NOTE — ED Provider Triage Note (Signed)
Emergency Medicine Provider Triage Evaluation Note  Rachel Santos , a 82 y.o. female  was evaluated in triage.  Pt complains of slurred speech starting around 2:30 pm.  She states that she has a hard time coming up with words and has some slurred speech.  Denies any focal weakness or numbness.  Has a history of TIA and states that her symptoms have improved  Review of Systems  Positive: aphasia Negative: Weakness   Physical Exam  BP (!) 120/104   Pulse 75   Temp 98.6 F (37 C)   Resp 16   Ht '5\' 7"'$  (1.702 m)   Wt 66.2 kg   SpO2 95%   BMI 22.87 kg/m  Gen:   Awake, no distress   Resp:  Normal effort  MSK:   Moves extremities without difficulty  Other:  ? Some slurred speech ? L facial droop   Medical Decision Making  Medically screening exam initiated at 7:42 PM.  Appropriate orders placed.  Rachel Santos was informed that the remainder of the evaluation will be completed by another provider, this initial triage assessment does not replace that evaluation, and the importance of remaining in the ED until their evaluation is complete.  Rachel Santos is a 82 y.o. female here presenting with slurred speech and expressive aphasia.  Her symptoms are almost back to baseline now.  I discussed case with neurologist, Dr. Regis Bill.  He states that patient may be a interventional candidate but we will hold off on activating code stroke right now since her deficits are not that severe.  Will order stat CTA head and neck and bring patient to her room    Drenda Freeze, MD 05/23/22 1944

## 2022-05-24 ENCOUNTER — Emergency Department (HOSPITAL_COMMUNITY): Payer: Medicare Other

## 2022-05-24 DIAGNOSIS — R4701 Aphasia: Secondary | ICD-10-CM | POA: Diagnosis not present

## 2022-05-24 DIAGNOSIS — R4781 Slurred speech: Secondary | ICD-10-CM | POA: Diagnosis not present

## 2022-05-24 LAB — RAPID URINE DRUG SCREEN, HOSP PERFORMED
Amphetamines: NOT DETECTED
Barbiturates: NOT DETECTED
Benzodiazepines: NOT DETECTED
Cocaine: NOT DETECTED
Opiates: NOT DETECTED
Tetrahydrocannabinol: NOT DETECTED

## 2022-05-24 NOTE — Consult Note (Signed)
NEUROLOGY CONSULTATION NOTE   Date of service: May 24, 2022 Patient Name: Rachel Santos MRN:  [id number] DOB:  04-Sep-1940 Reason for consult: "aphasia" Requesting Provider: Drenda Freeze, MD _ _ _   _ __   _ __ _ _  __ __   _ __   __ _  History of Present Illness  Rayne Nogales Branam is a 82 y.o. 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  She was brought in today for patient reporting "speech was googligoo". Daughter at bedside reports that her sister checks in on patient twice a day. Yesterday evening, patient reported to her other daughter that her speech was off and she had some trouble with finding right words. Per patient this occurred today in the afternoon but daughter at bedside clarifies that patient has dementia and she has poor recall/memory of recent events.  She was brought in to the ED after word finding difficulty again. She was almost back to her baseline on arrival to the ED.  LKW: 05/22/22 evening mRS: 2 tNKASE: not offered, outside window. Thrombectomy: not offered, no LVO and no symptoms on my evaluation in the ED. NIHSS components Score: Comment  1a Level of Conscious 0'[x]'$  1'[]'$  2'[]'$  3'[]'$      1b LOC Questions 0'[]'$  1'[x]'$  2'[]'$     Did not recall month correctly.  1c LOC Commands 0'[x]'$  1'[]'$  2'[]'$       2 Best Gaze 0'[x]'$  1'[]'$  2'[]'$       3 Visual 0'[x]'$  1'[]'$  2'[]'$  3'[]'$      4 Facial Palsy 0'[x]'$  1'[]'$  2'[]'$  3'[]'$      5a Motor Arm - left 0'[x]'$  1'[]'$  2'[]'$  3'[]'$  4'[]'$  UN'[]'$    5b Motor Arm - Right 0'[x]'$  1'[]'$  2'[]'$  3'[]'$  4'[]'$  UN'[]'$    6a Motor Leg - Left 0'[x]'$  1'[]'$  2'[]'$  3'[]'$  4'[]'$  UN'[]'$    6b Motor Leg - Right 0'[x]'$  1'[]'$  2'[]'$  3'[]'$  4'[]'$  UN'[]'$    7 Limb Ataxia 0'[x]'$  1'[]'$  2'[]'$  3'[]'$  UN'[]'$     8 Sensory 0'[x]'$  1'[]'$  2'[]'$  UN'[]'$      9 Best  Language 0'[x]'$  1'[]'$  2'[]'$  3'[]'$      10 Dysarthria 0'[x]'$  1'[]'$  2'[]'$  UN'[]'$      11 Extinct. and Inattention 0'[x]'$  1'[]'$  2'[]'$       TOTAL: 1       ROS   Constitutional Denies weight loss, fever and chills.   HEENT Denies changes in vision and hearing.   Respiratory Denies SOB and cough.   CV Denies palpitations and CP   GI Denies abdominal pain, nausea, vomiting and diarrhea.   GU Denies dysuria and urinary frequency.   MSK Denies myalgia and joint pain.   Skin Denies rash and pruritus.   Neurological Denies headache and syncope.   Psychiatric Denies recent changes in mood. Denies anxiety and depression.    Past History   Past Medical History:  Diagnosis Date   Acute CVA (cerebrovascular accident) ([place]) 01/24/2019   Anxiety    Arthritis    knees   Atrial fibrillation (HCC)    CAD (coronary artery disease)    a. pre TAVR cath 2020 mild nonobstructive CAD in the LAD and CX with moderate nononstructive disease in the mid-distal RCA and PDA.   Cancer ([place])    left  breast   Cataracts, bilateral    CKD (chronic kidney disease), stage III (HCC)    CVA (cerebral vascular accident) ([place])    Depression    Diabetes mellitus    DVT (deep venous thrombosis) (HCC)    hx of   Dyspnea    with exertion   Full dentures    GERD (gastroesophageal reflux disease)    Groin hematoma    Hearing aid worn    B/L   History of TIA (transient ischemic attack)    mini stroke with vision problems   Hypercholesterolemia    Hypertension    Hypertensive nephropathy    MCI (mild cognitive impairment)    Memory loss    Middle cerebral artery stenosis, left    Mitral regurgitation    Paroxysmal atrial fibrillation (HCC)    Pneumonia    PONV (postoperative nausea and vomiting)    Premature atrial contraction    S/P Maze operation for atrial fibrillation 10/11/2012   Complete bilateral atrial lesion set using bipolar radiofrequency and cryothermy ablation with clipping of LA appendage   S/P mitral valve repair  10/11/2012   49m Sorin Memo 3D ring annuloplasty with 26 mm Edwards mc3 tricuspid ring annuloplasty    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   s/p TAVR with a 23 Edwards Sapien 3 Ultra THV via the TF approach   Thyroid nodule    Tricuspid regurgitation    Wears glasses    Past Surgical History:  Procedure Laterality Date   ABDOMINAL AORTOGRAM W/LOWER EXTREMITY N/A 03/14/2022   Procedure: ABDOMINAL AORTOGRAM W/LOWER EXTREMITY;  Surgeon: HCherre Robins MD;  [facility];  Service: Cardiovascular;  Laterality: N/A;   ARTERY REPAIR Right 01/22/2019   Procedure: Right Common Femoral Artery Repair;  Surgeon: DAngelia Mould MD;  [facility]  Service: Vascular;  Laterality: Right;   CARDIAC CATHETERIZATION  09-05-12   CARDIOVERSION N/A 07/31/2012   Procedure: CARDIOVERSION;  Surgeon: PJosue Hector MD;  [facility]  Service: Cardiovascular;  Laterality: N/A;   CATARACT EXTRACTION W/ INTRAOCULAR LENS  IMPLANT, BILATERAL     CESAREAN SECTION     x 2   COLONOSCOPY WITH PROPOFOL N/A 11/28/2014   Procedure: COLONOSCOPY WITH PROPOFOL;  Surgeon: PCarol Ada MD;  [facility];  Service: Endoscopy;  Laterality: N/A;   COLONOSCOPY WITH PROPOFOL N/A 01/22/2021   Procedure: COLONOSCOPY WITH PROPOFOL;  Surgeon: HCarol Ada MD;  [facility];  Service: Endoscopy;  Laterality: N/A;   DILATION AND CURETTAGE OF UTERUS     FEMORAL ARTERY EXPLORATION Right 01/22/2019   Procedure: RIGHT GROIN EXPLORATION with evacuation of hematoma;  Surgeon: DAngelia Mould MD;  [place] [facility]  Service: Vascular;  Laterality: Right;   FLEXIBLE SIGMOIDOSCOPY N/A 12/18/2015   Procedure: FLEXIBLE SIGMOIDOSCOPY;  Surgeon: PCarol Ada MD;  [facility];  Service: Endoscopy;  Laterality: N/A;   INTRAOPERATIVE TRANSESOPHAGEAL ECHOCARDIOGRAM N/A 10/11/2012   Procedure: INTRAOPERATIVE TRANSESOPHAGEAL ECHOCARDIOGRAM;  Surgeon:  CRexene Alberts MD;  [facility]  Service: Open Heart Surgery;  Laterality: N/A;   IR ANGIO INTRA EXTRACRAN SEL COM CAROTID INNOMINATE BILAT MOD SED  05/21/2019   IR ANGIO VERTEBRAL SEL VERTEBRAL BILAT MOD SED  05/21/2019   MASTECTOMY Left 1982   MAZE N/A 10/11/2012   Procedure: MAZE;  Surgeon: CRexene Alberts MD;  [facility]  Service: Open Heart Surgery;  Laterality: N/A;   MITRAL VALVE REPAIR N/A 10/11/2012   Procedure:  MITRAL VALVE REPAIR (MVR);  Surgeon: Rexene Alberts, MD;  Location: [facility];  Service: Open Heart Surgery;  Laterality: N/A;   MULTIPLE TOOTH EXTRACTIONS     PERIPHERAL VASCULAR BALLOON ANGIOPLASTY Right 03/14/2022   Procedure: PERIPHERAL VASCULAR BALLOON ANGIOPLASTY;  Surgeon: Cherre Robins, MD;  Location: [facility];  Service: Cardiovascular;  Laterality: Right;  Peroneal   PERIPHERAL VASCULAR INTERVENTION Right 03/14/2022   Procedure: PERIPHERAL VASCULAR INTERVENTION;  Surgeon: Cherre Robins, MD;  Location: [facility];  Service: Cardiovascular;  Laterality: Right;  SFA   RADIOLOGY WITH ANESTHESIA N/A 06/19/2019   Procedure: STENTING;  Surgeon: Luanne Bras, MD;  Location: [facility];  Service: Radiology;  Laterality: N/A;   RIGHT HEART CATH AND CORONARY ANGIOGRAPHY N/A 12/24/2018   Procedure: RIGHT HEART CATH AND CORONARY ANGIOGRAPHY;  Surgeon: [name], MD;  Location: [facility];  Service: Cardiovascular;  Laterality: N/A;   TEE WITHOUT CARDIOVERSION N/A 07/31/2012   Procedure: TRANSESOPHAGEAL ECHOCARDIOGRAM (TEE);  Surgeon: Josue Hector, MD;  Location: [facility];  Service: Cardiovascular;  Laterality: N/A;   TEE WITHOUT CARDIOVERSION N/A 01/22/2019   Procedure: TRANSESOPHAGEAL ECHOCARDIOGRAM (TEE);  Surgeon: [name], MD;  Location: [facility];  Service: Open Heart Surgery;  Laterality: N/A;   TRANSCATHETER AORTIC VALVE REPLACEMENT, TRANSFEMORAL N/A 01/22/2019   Procedure: TRANSCATHETER AORTIC  VALVE REPLACEMENT, TRANSFEMORAL;  Surgeon: [name], MD;  Location: [facility];  Service: Open Heart Surgery;  Laterality: N/A;   TRICUSPID VALVE REPLACEMENT N/A 10/11/2012   Procedure: TRICUSPID VALVE REPAIR;  Surgeon: Rexene Alberts, MD;  Location: [facility];  Service: Open Heart Surgery;  Laterality: N/A;   TUBAL LIGATION     Family History  Problem Relation Age of Onset   Alcohol abuse Mother    Cancer Father        prostate   Hypertension Father    Heart attack Paternal Grandfather    Stroke Neg Hx    Social History   Socioeconomic History   Marital status: Widowed    Spouse name: Not on file   Number of children: 3   Years of education: Not on file   Highest education level: High school graduate  Occupational History   Occupation: Air cabin crew  Tobacco Use   Smoking status: Unknown    Passive exposure: Never   Smokeless tobacco: Never  Vaping Use   Vaping Use: Never used  Substance and Sexual Activity   Alcohol use: No   Drug use: No   Sexual activity: Never  Other Topics Concern   Not on file  Social History Narrative   04/19/21 lives alone, dgtr lives close by   Social Determinants of Health   Financial Resource Strain: Not on file  Food Insecurity: No Food Insecurity (12/20/2021)   Hunger Vital Sign    Worried About Running Out of Food in the Last Year: Never true    [place] in the Last Year: Never true  Transportation Needs: No Transportation Needs (12/20/2021)   PRAPARE - Hydrologist (Medical): No    Lack of Transportation (Non-Medical): No  Physical Activity: Not on file  Stress: Not on file  Social Connections: Not on file   Allergies  Allergen Reactions   Buspirone Hcl     lethargic/tired   Metformin And Related Diarrhea   Ozempic (0.25 Or 0.5 Mg-Dose) [Semaglutide(0.25 Or 0.'5mg'$ -Dos)]     Other  reaction(s): nausea   Ultram [Tramadol] Nausea And Vomiting   Nickel  Rash    Pt unable to wear jewelry made of nickel.   Pneumococcal Vaccines Swelling and Rash    At injection set    Medications  (Not in a hospital admission)    Vitals   Vitals:   05/23/22 1936 05/23/22 1940 05/23/22 2230 05/23/22 2334  BP: (!) 120/104  (!) 149/59   Pulse: 75  60   Resp: 16  20   Temp: 98.6 F (37 C)   98.4 F (36.9 C)  TempSrc:    Oral  SpO2: 95%  99%   Weight:  66.2 kg    Height:  '5\' 7"'$  (1.702 m)       Body mass index is 22.87 kg/m.  Physical Exam   General: Laying comfortably in bed; in no acute distress.  HENT: Normal oropharynx and mucosa. Normal external appearance of ears and nose.  Neck: Supple, no pain or tenderness  CV: No JVD. No peripheral edema.  Pulmonary: Symmetric Chest rise. Normal respiratory effort.  Abdomen: Soft to touch, non-tender.  Ext: No cyanosis, edema, or deformity  Skin: No rash. Normal palpation of skin.   Musculoskeletal: Normal digits and nails by inspection. No clubbing.   Neurologic Examination  Mental status/Cognition: Alert, oriented to self, place, but not to month and year, good attention.  Speech/language: Fluent, comprehension intact, object naming intact, repetition intact.  Cranial nerves:   CN II Pupils equal and reactive to light, no VF deficits    CN III,IV,VI EOM intact, no gaze preference or deviation, no nystagmus    CN V normal sensation in V1, V2, and V3 segments bilaterally    CN VII no asymmetry, no nasolabial fold flattening    CN VIII normal hearing to speech    CN IX & X normal palatal elevation, no uvular deviation    CN XI 5/5 head turn and 5/5 shoulder shrug bilaterally    CN XII midline tongue protrusion    Motor:  Muscle bulk: normal, tone normal, pronator drift none tremor none Mvmt Root Nerve  Muscle Right Left Comments  SA C5/6 Ax Deltoid 5 5   EF C5/6 Mc Biceps 5 5   EE C6/7/8 Rad Triceps 5 5   WF C6/7 Med FCR     WE C7/8 PIN ECU     F Ab C8/T1 U ADM/FDI 5 5   HF L1/2/3 Fem  Illopsoas 5 5   KE L2/3/4 Fem Quad 5 5   DF L4/5 D Peron Tib Ant 5 5   PF S1/2 Tibial Grc/Sol 5 5    Sensation:  Light touch Intact throughout   Pin prick    Temperature    Vibration   Proprioception    Coordination/Complex Motor:  - Finger to Nose intact BL - Heel to shin intact BL - Rapid alternating movement are normal - Gait: deferred.  Labs   CBC:  Recent Labs  Lab 05/23/22 2023 05/23/22 2031  WBC 7.9  --   NEUTROABS 4.7  --   HGB 14.3 13.9  HCT 44.5 41.0  MCV 91.9  --   PLT 198  --     Basic Metabolic Panel:  Lab Results  Component Value Date   NA 140 05/23/2022   K 3.7 05/23/2022   CO2 23 05/23/2022   GLUCOSE 99 05/23/2022   BUN 27 (H) 05/23/2022   CREATININE 0.70 05/23/2022   CALCIUM 9.1 05/23/2022   GFRNONAA >60  05/23/2022   GFRAA 55 (L) 06/27/2019   Lipid Panel:  Lab Results  Component Value Date   LDLCALC 44 01/23/2019   HgbA1c:  Lab Results  Component Value Date   HGBA1C 8.3 (H) 10/17/2021   Urine Drug Screen: No results found for: "LABOPIA", "COCAINSCRNUR", "LABBENZ", "AMPHETMU", "THCU", "LABBARB"  Alcohol Level     Component Value Date/Time   ETH <10 05/23/2022 2023    CT Head without contrast(Personally reviewed): CTH was negative for a large hypodensity concerning for a large territory infarct or hyperdensity concerning for an ICH  CT angio Head and Neck with contrast(Personally reviewed): No LVO  MRI Brain: Pending  Impression   LETRICE VOLLE is a 82 y.o. 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  She was brought in today for concern for aphasia which per daughter was noted yesterday evening more than 24 hours ago.  Symptoms completely resolved and patient is back to her baseline.  Suspect TIA vs ?seizure. From a stroke standpoint, she is already on maximal medical therapy with Aspirin and Eliquis. Low overall suspicion for seizure specially since patient does recall having word salad speech and daughter did not notice any obvious jerking or shaking during the event concerning for a seizure. Recommendations  - MRI Brain w/o contrast. Unlikely to benefit from full stroke workup given she is already on maximal medical therapy with Aspirin and eliquis. - routine EEG. Can be done outpatient. - follow up with neurology outpatient. ______________________________________________________________________   Thank you for the opportunity to take part in the care of this patient. If you have any further questions, please contact the neurology consultation attending.  Signed,  [place] Pager Number [phone] _ _ _   _ __   _ __ _ _  __ __   _ __   __ _

## 2022-05-24 NOTE — ED Notes (Signed)
Patient transported to MRI 

## 2022-05-24 NOTE — ED Provider Notes (Signed)
I assumed care at signout to follow-up on MRI imaging.  MRI does not show any acute findings. Patient is already had neuro evaluation.  Patient has been found to be on maximal stroke therapy and does not require admission. Patient will be referred to neurology. Patient awake alert no acute distress.  She answers questions properly.  No arm or leg drift. Discussed findings with patient and daughter.  Patient safe for discharge   Ripley Fraise, MD 05/24/22 [phone]

## 2022-05-26 ENCOUNTER — Inpatient Hospital Stay (HOSPITAL_COMMUNITY)
Admission: EM | Admit: 2022-05-26 | Discharge: 2022-06-02 | DRG: 480 | Disposition: A | Discharge status: Skilled Nursing Facility | Payer: Medicare Other | Attending: Internal Medicine | Admitting: Internal Medicine

## 2022-05-26 ENCOUNTER — Emergency Department (HOSPITAL_COMMUNITY): Payer: Medicare Other

## 2022-05-26 ENCOUNTER — Other Ambulatory Visit: Payer: Self-pay

## 2022-05-26 ENCOUNTER — Encounter (HOSPITAL_COMMUNITY): Payer: Self-pay | Admitting: Emergency Medicine

## 2022-05-26 DIAGNOSIS — E872 Acidosis, unspecified: Secondary | ICD-10-CM | POA: Diagnosis not present

## 2022-05-26 DIAGNOSIS — H918X3 Other specified hearing loss, bilateral: Secondary | ICD-10-CM | POA: Diagnosis present

## 2022-05-26 DIAGNOSIS — R4701 Aphasia: Secondary | ICD-10-CM | POA: Diagnosis not present

## 2022-05-26 DIAGNOSIS — I10 Essential (primary) hypertension: Secondary | ICD-10-CM | POA: Diagnosis present

## 2022-05-26 DIAGNOSIS — M1990 Unspecified osteoarthritis, unspecified site: Secondary | ICD-10-CM | POA: Diagnosis present

## 2022-05-26 DIAGNOSIS — F32A Depression, unspecified: Secondary | ICD-10-CM | POA: Diagnosis present

## 2022-05-26 DIAGNOSIS — Z9862 Peripheral vascular angioplasty status: Secondary | ICD-10-CM

## 2022-05-26 DIAGNOSIS — Z885 Allergy status to narcotic agent status: Secondary | ICD-10-CM

## 2022-05-26 DIAGNOSIS — E1165 Type 2 diabetes mellitus with hyperglycemia: Secondary | ICD-10-CM | POA: Diagnosis not present

## 2022-05-26 DIAGNOSIS — I5032 Chronic diastolic (congestive) heart failure: Secondary | ICD-10-CM | POA: Diagnosis present

## 2022-05-26 DIAGNOSIS — Z953 Presence of xenogenic heart valve: Secondary | ICD-10-CM

## 2022-05-26 DIAGNOSIS — Z794 Long term (current) use of insulin: Secondary | ICD-10-CM

## 2022-05-26 DIAGNOSIS — I130 Hypertensive heart and chronic kidney disease with heart failure and stage 1 through stage 4 chronic kidney disease, or unspecified chronic kidney disease: Secondary | ICD-10-CM | POA: Diagnosis present

## 2022-05-26 DIAGNOSIS — I2510 Atherosclerotic heart disease of native coronary artery without angina pectoris: Secondary | ICD-10-CM | POA: Diagnosis present

## 2022-05-26 DIAGNOSIS — I480 Paroxysmal atrial fibrillation: Secondary | ICD-10-CM | POA: Diagnosis not present

## 2022-05-26 DIAGNOSIS — I69891 Dysphagia following other cerebrovascular disease: Secondary | ICD-10-CM | POA: Diagnosis not present

## 2022-05-26 DIAGNOSIS — Z8042 Family history of malignant neoplasm of prostate: Secondary | ICD-10-CM

## 2022-05-26 DIAGNOSIS — E7800 Pure hypercholesterolemia, unspecified: Secondary | ICD-10-CM | POA: Diagnosis present

## 2022-05-26 DIAGNOSIS — Z853 Personal history of malignant neoplasm of breast: Secondary | ICD-10-CM

## 2022-05-26 DIAGNOSIS — Y92019 Unspecified place in single-family (private) house as the place of occurrence of the external cause: Secondary | ICD-10-CM | POA: Diagnosis not present

## 2022-05-26 DIAGNOSIS — Z01818 Encounter for other preprocedural examination: Secondary | ICD-10-CM | POA: Diagnosis not present

## 2022-05-26 DIAGNOSIS — I6930 Unspecified sequelae of cerebral infarction: Secondary | ICD-10-CM

## 2022-05-26 DIAGNOSIS — S72001A Fracture of unspecified part of neck of right femur, initial encounter for closed fracture: Secondary | ICD-10-CM | POA: Diagnosis not present

## 2022-05-26 DIAGNOSIS — Z811 Family history of alcohol abuse and dependence: Secondary | ICD-10-CM

## 2022-05-26 DIAGNOSIS — E118 Type 2 diabetes mellitus with unspecified complications: Secondary | ICD-10-CM | POA: Diagnosis not present

## 2022-05-26 DIAGNOSIS — E119 Type 2 diabetes mellitus without complications: Secondary | ICD-10-CM | POA: Diagnosis not present

## 2022-05-26 DIAGNOSIS — R1312 Dysphagia, oropharyngeal phase: Secondary | ICD-10-CM | POA: Diagnosis not present

## 2022-05-26 DIAGNOSIS — Z9012 Acquired absence of left breast and nipple: Secondary | ICD-10-CM | POA: Diagnosis not present

## 2022-05-26 DIAGNOSIS — W19XXXA Unspecified fall, initial encounter: Secondary | ICD-10-CM | POA: Diagnosis not present

## 2022-05-26 DIAGNOSIS — E1151 Type 2 diabetes mellitus with diabetic peripheral angiopathy without gangrene: Secondary | ICD-10-CM | POA: Diagnosis present

## 2022-05-26 DIAGNOSIS — S72141A Displaced intertrochanteric fracture of right femur, initial encounter for closed fracture: Principal | ICD-10-CM | POA: Diagnosis present

## 2022-05-26 DIAGNOSIS — Z7401 Bed confinement status: Secondary | ICD-10-CM | POA: Diagnosis not present

## 2022-05-26 DIAGNOSIS — Z9889 Other specified postprocedural states: Secondary | ICD-10-CM

## 2022-05-26 DIAGNOSIS — F0393 Unspecified dementia, unspecified severity, with mood disturbance: Secondary | ICD-10-CM | POA: Diagnosis present

## 2022-05-26 DIAGNOSIS — J189 Pneumonia, unspecified organism: Secondary | ICD-10-CM | POA: Diagnosis not present

## 2022-05-26 DIAGNOSIS — F0394 Unspecified dementia, unspecified severity, with anxiety: Secondary | ICD-10-CM | POA: Diagnosis not present

## 2022-05-26 DIAGNOSIS — Z7984 Long term (current) use of oral hypoglycemic drugs: Secondary | ICD-10-CM

## 2022-05-26 DIAGNOSIS — Z86718 Personal history of other venous thrombosis and embolism: Secondary | ICD-10-CM

## 2022-05-26 DIAGNOSIS — Z8249 Family history of ischemic heart disease and other diseases of the circulatory system: Secondary | ICD-10-CM

## 2022-05-26 DIAGNOSIS — N1831 Chronic kidney disease, stage 3a: Secondary | ICD-10-CM | POA: Diagnosis present

## 2022-05-26 DIAGNOSIS — K219 Gastro-esophageal reflux disease without esophagitis: Secondary | ICD-10-CM | POA: Diagnosis present

## 2022-05-26 DIAGNOSIS — W1830XA Fall on same level, unspecified, initial encounter: Secondary | ICD-10-CM | POA: Diagnosis present

## 2022-05-26 DIAGNOSIS — R2681 Unsteadiness on feet: Secondary | ICD-10-CM | POA: Diagnosis not present

## 2022-05-26 DIAGNOSIS — I129 Hypertensive chronic kidney disease with stage 1 through stage 4 chronic kidney disease, or unspecified chronic kidney disease: Secondary | ICD-10-CM | POA: Diagnosis not present

## 2022-05-26 DIAGNOSIS — Z888 Allergy status to other drugs, medicaments and biological substances status: Secondary | ICD-10-CM

## 2022-05-26 DIAGNOSIS — G8911 Acute pain due to trauma: Secondary | ICD-10-CM | POA: Diagnosis not present

## 2022-05-26 DIAGNOSIS — S72111D Displaced fracture of greater trochanter of right femur, subsequent encounter for closed fracture with routine healing: Secondary | ICD-10-CM | POA: Diagnosis not present

## 2022-05-26 DIAGNOSIS — Z79899 Other long term (current) drug therapy: Secondary | ICD-10-CM | POA: Diagnosis not present

## 2022-05-26 DIAGNOSIS — Z974 Presence of external hearing-aid: Secondary | ICD-10-CM

## 2022-05-26 DIAGNOSIS — R2689 Other abnormalities of gait and mobility: Secondary | ICD-10-CM | POA: Diagnosis not present

## 2022-05-26 DIAGNOSIS — R509 Fever, unspecified: Secondary | ICD-10-CM | POA: Diagnosis not present

## 2022-05-26 DIAGNOSIS — E1122 Type 2 diabetes mellitus with diabetic chronic kidney disease: Secondary | ICD-10-CM | POA: Diagnosis present

## 2022-05-26 DIAGNOSIS — S7291XA Unspecified fracture of right femur, initial encounter for closed fracture: Secondary | ICD-10-CM | POA: Diagnosis not present

## 2022-05-26 DIAGNOSIS — Z8673 Personal history of transient ischemic attack (TIA), and cerebral infarction without residual deficits: Secondary | ICD-10-CM | POA: Diagnosis not present

## 2022-05-26 DIAGNOSIS — Z7901 Long term (current) use of anticoagulants: Secondary | ICD-10-CM

## 2022-05-26 DIAGNOSIS — T82897D Other specified complication of cardiac prosthetic devices, implants and grafts, subsequent encounter: Secondary | ICD-10-CM | POA: Diagnosis not present

## 2022-05-26 DIAGNOSIS — I69828 Other speech and language deficits following other cerebrovascular disease: Secondary | ICD-10-CM | POA: Diagnosis not present

## 2022-05-26 DIAGNOSIS — I350 Nonrheumatic aortic (valve) stenosis: Secondary | ICD-10-CM | POA: Diagnosis not present

## 2022-05-26 DIAGNOSIS — Z9181 History of falling: Secondary | ICD-10-CM | POA: Diagnosis not present

## 2022-05-26 DIAGNOSIS — R531 Weakness: Secondary | ICD-10-CM | POA: Diagnosis not present

## 2022-05-26 DIAGNOSIS — M6281 Muscle weakness (generalized): Secondary | ICD-10-CM | POA: Diagnosis not present

## 2022-05-26 DIAGNOSIS — D62 Acute posthemorrhagic anemia: Secondary | ICD-10-CM | POA: Diagnosis not present

## 2022-05-26 DIAGNOSIS — Z7902 Long term (current) use of antithrombotics/antiplatelets: Secondary | ICD-10-CM

## 2022-05-26 DIAGNOSIS — Z887 Allergy status to serum and vaccine status: Secondary | ICD-10-CM

## 2022-05-26 LAB — COMPREHENSIVE METABOLIC PANEL
ALT: 30 U/L (ref 0–44)
AST: 28 U/L (ref 15–41)
Albumin: 3.7 g/dL (ref 3.5–5.0)
Alkaline Phosphatase: 79 U/L (ref 38–126)
Anion gap: 13 (ref 5–15)
BUN: 26 mg/dL — ABNORMAL HIGH (ref 8–23)
CO2: 24 mmol/L (ref 22–32)
Calcium: 9.5 mg/dL (ref 8.9–10.3)
Chloride: 102 mmol/L (ref 98–111)
Creatinine, Ser: 0.78 mg/dL (ref 0.44–1.00)
GFR, Estimated: >60 mL/min (ref >60)
Glucose, Bld: 152 mg/dL — ABNORMAL HIGH (ref 70–99)
Potassium: 4 mmol/L (ref 3.5–5.1)
Sodium: 139 mmol/L (ref 135–145)
Total Bilirubin: 0.5 mg/dL (ref 0.3–1.2)
Total Protein: 6.7 g/dL (ref 6.5–8.1)

## 2022-05-26 LAB — TYPE AND SCREEN
ABO/RH(D): A NEG
Antibody Screen: NEGATIVE

## 2022-05-26 LAB — CBG MONITORING, ED: Glucose-Capillary: 127 mg/dL — ABNORMAL HIGH (ref 70–99)

## 2022-05-26 LAB — LACTIC ACID, PLASMA: Lactic Acid, Venous: 2.1 mmol/L (ref 0.5–1.9)

## 2022-05-26 LAB — CBC
HCT: 43.1 % (ref 36.0–46.0)
Hemoglobin: 14.5 g/dL (ref 12.0–15.0)
MCH: 30 pg (ref 26.0–34.0)
MCHC: 33.6 g/dL (ref 30.0–36.0)
MCV: 89 fL (ref 80.0–100.0)
Platelets: 198 10*3/uL (ref 150–400)
RBC: 4.84 MIL/uL (ref 3.87–5.11)
RDW: 13.5 % (ref 11.5–15.5)
WBC: 10.2 10*3/uL (ref 4.0–10.5)
nRBC: 0 % (ref 0.0–0.2)

## 2022-05-26 LAB — ETHANOL: Alcohol, Ethyl (B): <10 mg/dL (ref <10)

## 2022-05-26 LAB — I-STAT CHEM 8, ED
BUN: 26 mg/dL — ABNORMAL HIGH (ref 8–23)
Calcium, Ion: 1.08 mmol/L — ABNORMAL LOW (ref 1.15–1.40)
Chloride: 105 mmol/L (ref 98–111)
Creatinine, Ser: 0.7 mg/dL (ref 0.44–1.00)
Glucose, Bld: 147 mg/dL — ABNORMAL HIGH (ref 70–99)
HCT: 42 % (ref 36.0–46.0)
Hemoglobin: 14.3 g/dL (ref 12.0–15.0)
Potassium: 4.1 mmol/L (ref 3.5–5.1)
Sodium: 140 mmol/L (ref 135–145)
TCO2: 25 mmol/L (ref 22–32)

## 2022-05-26 LAB — PROTIME-INR
INR: 1.1 (ref 0.8–1.2)
Prothrombin Time: 13.9 s (ref 11.4–15.2)

## 2022-05-26 LAB — SAMPLE TO BLOOD BANK

## 2022-05-26 LAB — CK: Total CK: 43 U/L (ref 38–234)

## 2022-05-26 LAB — TROPONIN I (HIGH SENSITIVITY): Troponin I (High Sensitivity): 10 ng/L (ref <18)

## 2022-05-26 MED ORDER — METHOCARBAMOL 1000 MG/10ML IJ SOLN: 500.0000 mg | Freq: Four times a day (QID) | INTRAVENOUS | Status: DC | PRN

## 2022-05-26 MED ORDER — METHOCARBAMOL 500 MG PO TABS
500.0000 mg | ORAL_TABLET | Freq: Four times a day (QID) | ORAL | Status: DC | PRN
  Administered 2022-05-30 – 2022-06-02 (×2): 500 mg via ORAL
  Filled 2022-05-26 (×2): qty 1

## 2022-05-26 MED ORDER — HYDROCODONE-ACETAMINOPHEN 5-325 MG PO TABS
1.0000 | ORAL_TABLET | Freq: Four times a day (QID) | ORAL | Status: DC | PRN
  Administered 2022-05-26 – 2022-05-29 (×2): 1 via ORAL
  Administered 2022-05-30 – 2022-06-01 (×3): 2 via ORAL
  Filled 2022-05-26: qty 2
  Filled 2022-05-26 (×2): qty 1
  Filled 2022-05-26 (×2): qty 2

## 2022-05-26 MED ORDER — INSULIN ASPART 100 UNIT/ML IJ SOLN
0.0000 [IU] | INTRAMUSCULAR | Status: DC
  Administered 2022-05-26: 1 [IU] via SUBCUTANEOUS
  Administered 2022-05-27: 5 [IU] via SUBCUTANEOUS
  Administered 2022-05-27: 1 [IU] via SUBCUTANEOUS
  Administered 2022-05-27: 2 [IU] via SUBCUTANEOUS
  Administered 2022-05-27 – 2022-05-28 (×3): 5 [IU] via SUBCUTANEOUS
  Administered 2022-05-28: 1 [IU] via SUBCUTANEOUS
  Administered 2022-05-28: 2 [IU] via SUBCUTANEOUS
  Administered 2022-05-28: 1 [IU] via SUBCUTANEOUS
  Administered 2022-05-29: 2 [IU] via SUBCUTANEOUS
  Administered 2022-05-29: 3 [IU] via SUBCUTANEOUS
  Administered 2022-05-29: 2 [IU] via SUBCUTANEOUS

## 2022-05-26 MED ORDER — MORPHINE SULFATE (PF) 2 MG/ML IV SOLN
0.5000 mg | INTRAVENOUS | Status: DC | PRN
  Administered 2022-05-27: 0.5 mg via INTRAVENOUS
  Filled 2022-05-26: qty 1

## 2022-05-26 MED ORDER — VENLAFAXINE HCL ER 75 MG PO CP24
225.0000 mg | ORAL_CAPSULE | Freq: Every day | ORAL | Status: DC
  Administered 2022-05-28 – 2022-06-02 (×6): 225 mg via ORAL
  Filled 2022-05-26 (×7): qty 3

## 2022-05-26 MED ORDER — VENLAFAXINE HCL ER 75 MG PO CP24: 75.0000 mg | ORAL_CAPSULE | Freq: Every day | ORAL | Status: DC

## 2022-05-26 MED ORDER — ATORVASTATIN CALCIUM 80 MG PO TABS
80.0000 mg | ORAL_TABLET | Freq: Every day | ORAL | Status: DC
  Administered 2022-05-28 – 2022-06-02 (×6): 80 mg via ORAL
  Filled 2022-05-26 (×7): qty 1

## 2022-05-26 MED ORDER — LACTATED RINGERS IV BOLUS
500.0000 mL | Freq: Once | INTRAVENOUS | Status: AC
  Administered 2022-05-26: 500 mL via INTRAVENOUS

## 2022-05-26 NOTE — Assessment & Plan Note (Addendum)
Hold aspirin and Eliquis as patient will need surgical procedure

## 2022-05-26 NOTE — Assessment & Plan Note (Signed)
Hold Eliquis for the patient may need to proceed to orthopedics procedure if needs to be off of anticoagulation for prolonged period time may need heparin bridge

## 2022-05-26 NOTE — ED Notes (Signed)
Trauma Response Nurse Documentation   Rachel Santos is a 82 y.o. female arriving to [facility] via [facility]  On Eliquis (apixaban) daily. Trauma was activated as a Level 2 by Karlyne Greenspan based on the following trauma criteria Elderly patients > 65 with head trauma on anti-coagulation (excluding ASA). Trauma team at the bedside on patient arrival.   Patient cleared for CT by Dr. Reather Converse. Pt transported to CT with trauma response nurse present to monitor. RN remained with the patient throughout their absence from the department for clinical observation.   GCS 15.  History   Past Medical History:  Diagnosis Date   Acute CVA (cerebrovascular accident) ([place]) 01/24/2019   Anxiety    Arthritis    knees   Atrial fibrillation (HCC)    CAD (coronary artery disease)    a. pre TAVR cath 2020 mild nonobstructive CAD in the LAD and CX with moderate nononstructive disease in the mid-distal RCA and PDA.   Cancer ([place])    left breast   Cataracts, bilateral    CKD (chronic kidney disease), stage III (HCC)    CVA (cerebral vascular accident) ([place])    Depression    Diabetes mellitus    DVT (deep venous thrombosis) (HCC)    hx of   Dyspnea    with exertion   Full dentures    GERD (gastroesophageal reflux disease)    Groin hematoma    Hearing aid worn    B/L   History of TIA (transient ischemic attack)    mini stroke with vision problems   Hypercholesterolemia    Hypertension    Hypertensive nephropathy    MCI (mild cognitive impairment)    Memory loss    Middle cerebral artery stenosis, left    Mitral regurgitation    Paroxysmal atrial fibrillation (HCC)    Pneumonia    PONV (postoperative nausea and vomiting)    Premature atrial contraction    S/P Maze operation for atrial fibrillation 10/11/2012   Complete bilateral atrial lesion set using bipolar radiofrequency and cryothermy ablation with clipping of LA appendage   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   11m  Sorin Memo 3D ring annuloplasty with 26 mm Edwards mc3 tricuspid ring annuloplasty    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   s/p TAVR with a 23 Edwards Sapien 3 Ultra THV via the TF approach   Thyroid nodule    Tricuspid regurgitation    Wears glasses      Past Surgical History:  Procedure Laterality Date   ABDOMINAL AORTOGRAM W/LOWER EXTREMITY N/A 03/14/2022   Procedure: ABDOMINAL AORTOGRAM W/LOWER EXTREMITY;  Surgeon: HCherre Robins MD;  [facility]: [facility];  Service: Cardiovascular;  Laterality: N/A;   ARTERY REPAIR Right 01/22/2019   Procedure: Right Common Femoral Artery Repair;  Surgeon: DAngelia Mould MD;  [facility] [facility]  Service: Vascular;  Laterality: Right;   CARDIAC CATHETERIZATION  09-05-12   CARDIOVERSION N/A 07/31/2012   Procedure: CARDIOVERSION;  Surgeon: PJosue Hector MD;  Location: [facility]  Service: Cardiovascular;  Laterality: N/A;   CATARACT EXTRACTION W/ INTRAOCULAR LENS  IMPLANT, BILATERAL     CESAREAN SECTION     x 2   COLONOSCOPY WITH PROPOFOL N/A 11/28/2014   Procedure: COLONOSCOPY WITH PROPOFOL;  Surgeon: PCarol Ada MD;  Location: [facility];  Service: Endoscopy;  Laterality: N/A;   COLONOSCOPY WITH PROPOFOL N/A 01/22/2021   Procedure: COLONOSCOPY WITH PROPOFOL;  Surgeon: HCarol Ada MD;  Location: [facility];  Service: Endoscopy;  Laterality: N/A;   DILATION AND CURETTAGE OF UTERUS     FEMORAL ARTERY EXPLORATION Right 01/22/2019   Procedure: RIGHT GROIN EXPLORATION with evacuation of hematoma;  Surgeon: Angelia Mould, MD;  Location: [place];  Service: Vascular;  Laterality: Right;   FLEXIBLE SIGMOIDOSCOPY N/A 12/18/2015   Procedure: FLEXIBLE SIGMOIDOSCOPY;  Surgeon: Carol Ada, MD;  [facility]: [facility];  Service: Endoscopy;  Laterality: N/A;   INTRAOPERATIVE TRANSESOPHAGEAL ECHOCARDIOGRAM N/A 10/11/2012   Procedure: INTRAOPERATIVE TRANSESOPHAGEAL ECHOCARDIOGRAM;  Surgeon: Rexene Alberts, MD;   Location: [place];  Service: Open Heart Surgery;  Laterality: N/A;   IR ANGIO INTRA EXTRACRAN SEL COM CAROTID INNOMINATE BILAT MOD SED  05/21/2019   IR ANGIO VERTEBRAL SEL VERTEBRAL BILAT MOD SED  05/21/2019   MASTECTOMY Left 1982   MAZE N/A 10/11/2012   Procedure: MAZE;  Surgeon: Rexene Alberts, MD;  Location: [place];  Service: Open Heart Surgery;  Laterality: N/A;   MITRAL VALVE REPAIR N/A 10/11/2012   Procedure: MITRAL VALVE REPAIR (MVR);  Surgeon: Rexene Alberts, MD;  Location: [place];  Service: Open Heart Surgery;  Laterality: N/A;   MULTIPLE TOOTH EXTRACTIONS     PERIPHERAL VASCULAR BALLOON ANGIOPLASTY Right 03/14/2022   Procedure: PERIPHERAL VASCULAR BALLOON ANGIOPLASTY;  Surgeon: Cherre Robins, MD;  Location: [facility];  Service: Cardiovascular;  Laterality: Right;  Peroneal   PERIPHERAL VASCULAR INTERVENTION Right 03/14/2022   Procedure: PERIPHERAL VASCULAR INTERVENTION;  Surgeon: Cherre Robins, MD;  Location: [place];  Service: Cardiovascular;  Laterality: Right;  SFA   RADIOLOGY WITH ANESTHESIA N/A 06/19/2019   Procedure: STENTING;  Surgeon: Luanne Bras, MD;  Location: [place];  Service: Radiology;  Laterality: N/A;   RIGHT HEART CATH AND CORONARY ANGIOGRAPHY N/A 12/24/2018   Procedure: RIGHT HEART CATH AND CORONARY ANGIOGRAPHY;  Surgeon: [name], MD;  Location: [facility];  Service: Cardiovascular;  Laterality: N/A;   TEE WITHOUT CARDIOVERSION N/A 07/31/2012   Procedure: TRANSESOPHAGEAL ECHOCARDIOGRAM (TEE);  Surgeon: Josue Hector, MD;  Location: [place];  Service: Cardiovascular;  Laterality: N/A;   TEE WITHOUT CARDIOVERSION N/A 01/22/2019   Procedure: TRANSESOPHAGEAL ECHOCARDIOGRAM (TEE);  Surgeon: [name], MD;  Location: [facility] [facility];  Service: Open Heart Surgery;  Laterality: N/A;   TRANSCATHETER AORTIC VALVE REPLACEMENT, TRANSFEMORAL N/A 01/22/2019   Procedure: TRANSCATHETER AORTIC VALVE REPLACEMENT,  TRANSFEMORAL;  Surgeon: [name], MD;  Location: [place] [facility];  Service: Open Heart Surgery;  Laterality: N/A;   TRICUSPID VALVE REPLACEMENT N/A 10/11/2012   Procedure: TRICUSPID VALVE REPAIR;  Surgeon: Rexene Alberts, MD;  Location: [place];  Service: Open Heart Surgery;  Laterality: N/A;   TUBAL LIGATION         Initial Focused Assessment (If applicable, or please see trauma documentation): Airway-- intact, no visible obstructions Breathing-- spontaneous, unlabored Circulation-- skin tear to right arm, bleeding controlled on arrival  CT's Completed:   CT Head and CT C-Spine   Interventions:  See event summary  Plan for disposition:  Admission to floor   Consults completed:  Orthopaedic Surgeon at 2142.  Event Summary: Patient brought in by [facility] from home. Patient had an unwitnessed fall this evening. Fire department was alerted by [employer] Fire department arrived on scene, had to force entry, patient was found inside the house on the floor. Patient denies loss of consciousness. Complaining of right hip/leg pain. Patient arrives to  department, alert and oriented x4, GCS 15. Right leg appears shortened and outwardly rotated. Only complaint of pain is to that right leg. 75 mcg fentanyl given by EMS. Manual BP 132/68. 22G PIV LAC started by EMS. Xray chest, pelvis, right knee completed. TRN transferred patient to CT and remained with the patient throughtout scans. CT head and c-spine completed. Patient transferred back to exam room by TRN.  MTP Summary (If applicable):   Bedside handoff with ED RN Shirlee Limerick.    Trudee Kuster  Trauma Response RN  Please call TRN at [phone] for further assistance.

## 2022-05-26 NOTE — ED Triage Notes (Signed)
Pt bib [facility] from home for fall on thinners. C/o right hip pain - shortening reported by ems. + pedal pulses. Downtime approx 30 mins. Denies loc. C collar in place. 75 mcg fent given PTA.

## 2022-05-26 NOTE — Progress Notes (Signed)
Orthopedic Tech Progress Note Patient Details:  Rachel Santos Apr 18, 1940 [id number]  Level 2 trauma   Patient ID: Rachel Santos, female   DOB: 1941/01/03, 82 y.o.   MRN: [id number]  Rachel Santos 05/26/2022, 9:17 PM

## 2022-05-26 NOTE — H&P (Signed)
Rachel Santos [id number] DOB: Oct 03, 1940 DOA: 05/26/2022     PCP: Charlane Ferretti, MD   Outpatient Specialists:   CAR[facility]  [name], MD Cardiology    Patient arrived to ER on 05/26/22 at 2021 Referred by Attending Elnora Morrison, MD   Patient coming from:    home Lives alone     Chief Complaint:   Chief Complaint  Patient presents with   Fall    HPI: Rachel Santos is a 82 y.o. 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  Life alert went off and EMS arrived they had to forcibly enter the house she lives at home alone but daughter checks on her frequently patient states she was balance and fell and now complaining of right hip and lower leg pain no fevers no chills.  She has been seen in the emergency department recently abnormal speech and was diagnosed with CVA but she is already on maximal therapy on aspirin and Eliquis plan was to her follow-up as an outpatient neurology      Regarding pertinent Chronic problems:    Hyperlipidemia -  on statins Lipitor (atorvastatin)  Lipid Panel     Component Value Date/Time   CHOL 112 01/23/2019 1530   TRIG 149 01/23/2019 1530   HDL 38 (L) 01/23/2019 1530   CHOLHDL 2.9 01/23/2019 1530   VLDL 30 01/23/2019 1530   LDLCALC 44 01/23/2019 1530       chronic CHF diastolic  - last echo [id number] EF 60 to 123456 grade 2 diastolic dysfunction    CAD  - On Aspirin, statin,                  -  followed by cardiology                    DM 2 -  Lab Results  Component Value Date   HGBA1C 8.3 (H) 10/17/2021   on  Farxiga    Hx of PAD with status post right lower extremity angiogram  with femoropopliteal angioplasty stenting, TPT angioplasty, peroneal artery angioplasty.  No longer on Plavix   Hx of CVA -  with residual deficits on Aspirin 81 mg, eliquis   A. Fib -  - CHA2DS2 vas score   8   current  on anticoagulation with  Eliquis,      While in ER:   Found to have right hip fracture Orthopedics aware Plan for n.p.o. postmidnight for possible procedure tomorrow Hold Eliquis     Ordered Right knee neg   Right hip fracture  Mildly displaced and comminuted intertrochanteric fracture of the right hip. CT HEAD   NON acute Ct neck No acute fracture or listhesis of the cervical spine.  CXR -  NON acute    Following Medications were ordered in ER: Medications  lactated ringers bolus 500 mL (500 mLs Intravenous New Bag/Given 05/26/22 2219)    _______________________________________________________ ER Provider Called:  orthopedics   Dr. Marcelino Scot They Recommend admit to medicine   Will see in AM     ED Triage Vitals  Enc Vitals Group     BP 05/26/22  2026 132/68     Pulse Rate 05/26/22 2032 74     Resp 05/26/22 2030 11     Temp 05/26/22 2030 (!) 97.3 F (36.3 C)     Temp Source 05/26/22 2030 Axillary     SpO2 05/26/22 2025 98 %     Weight 05/26/22 2031 145 lb 15.1 oz (66.2 kg)     Height 05/26/22 2031 '5\' 7"'$  (1.702 m)     Head Circumference --      Peak Flow --      Pain Score 05/26/22 2031 8     Pain Loc --      Pain Edu? --      Excl. in [place]? --   TMAX(24)@     _________________________________________ Significant initial  Findings: Abnormal Labs Reviewed  COMPREHENSIVE METABOLIC PANEL - Abnormal; Notable for the following components:      Result Value   Glucose, Bld 152 (*)    BUN 26 (*)    All other components within normal limits  LACTIC ACID, PLASMA - Abnormal; Notable for the following components:   Lactic Acid, Venous 2.1 (*)    All other components within normal limits  I-STAT CHEM 8, ED - Abnormal; Notable for the following components:    BUN 26 (*)    Glucose, Bld 147 (*)    Calcium, Ion 1.08 (*)    All other components within normal limits     _________________________ Troponin 10  ECG: Ordered Personally reviewed and interpreted by me showing: HR : 67 Rhythm:Sinus rhythm Atrial premature complex Probable left ventricular hypertrophy Anterior Q waves, possibly due to LVH Nonspecific T abnormalities, lateral leads QTC 426    The recent clinical data is shown below. Vitals:   05/26/22 2115 05/26/22 2130 05/26/22 2215 05/26/22 2230  BP: (!) 141/64 (!) 162/61 (!) 150/66 (!) 150/59  Pulse: 64 65 65 65  Resp: 20 (!) 24 (!) 21 (!) 22  Temp:      TempSrc:      SpO2: 97% 98% 99% 99%  Weight:      Height:          WBC     Component Value Date/Time   WBC 10.2 05/26/2022 2025   LYMPHSABS 2.3 05/23/2022 2023   LYMPHSABS 2.4 05/27/2019 1421   MONOABS 0.8 05/23/2022 2023   EOSABS 0.1 05/23/2022 2023   EOSABS 0.1 05/27/2019 1421   BASOSABS 0.0 05/23/2022 2023   BASOSABS 0.0 05/27/2019 1421     Lactic Acid, Venous    Component Value Date/Time   LATICACIDVEN 2.1 (HH) 05/26/2022 2025       UA  ordered   Urine analysis:    Component Value Date/Time   COLORURINE YELLOW 12/20/2021 0001   APPEARANCEUR HAZY (A) 12/20/2021 0001   LABSPEC 1.027 12/20/2021 0001   PHURINE 6.0 12/20/2021 0001   GLUCOSEU >=500 (A) 12/20/2021 0001   HGBUR NEGATIVE 12/20/2021 0001   BILIRUBINUR NEGATIVE 12/20/2021 0001   KETONESUR NEGATIVE 12/20/2021 0001   PROTEINUR NEGATIVE 12/20/2021 0001   UROBILINOGEN 1.0 10/09/2012 1630   NITRITE NEGATIVE 12/20/2021 0001   LEUKOCYTESUR TRACE (A) 12/20/2021 0001   _______________________________________________ Hospitalist was called for admission for right hip fracture The following Work up has been ordered so far:  Orders Placed This Encounter  Procedures   DG Chest [place] 1 View   DG Knee Right Port   DG Hip Unilat W or Wo Pelvis 2-3 Views Right   CT HEAD WO CONTRAST   CT  CERVICAL SPINE WO CONTRAST   Comprehensive metabolic panel   CBC   Ethanol   Urinalysis, Routine w reflex microscopic -Urine, Clean Catch   Lactic acid, plasma   Protime-INR   CK   Diet NPO time specified   Cardiac monitoring   Measure blood pressure   Initiate Carrier Fluid Protocol   Consult to orthopedic surgery   Consult to hospitalist   Pulse oximetry, continuous   I-Stat Chem 8, ED   ED EKG   EKG 12-Lead   Sample to Blood Bank     OTHER Significant initial  Findings:  labs showing:  Recent Labs  Lab 05/23/22 2023 05/23/22 2031 05/26/22 2025 05/26/22 2037  NA 140 140 139 140  K 3.7 3.7 4.0 4.1  CO2 23  --  24  --   GLUCOSE 99 99 152* 147*  BUN 27* 27* 26* 26*  CREATININE 0.80 0.70 0.78 0.70  CALCIUM 9.1  --  9.5  --     Cr   stable,    Lab Results  Component Value Date   CREATININE 0.70 05/26/2022   CREATININE 0.78 05/26/2022   CREATININE 0.70 05/23/2022    Recent Labs  Lab 05/23/22 2023 05/26/22 2025  AST 31 28  ALT 35 30  ALKPHOS 72 79  BILITOT 0.5 0.5  PROT 6.7 6.7  ALBUMIN 3.6 3.7   Lab Results  Component Value Date   CALCIUM 9.5 05/26/2022   PHOS 3.5 10/18/2021        Plt: Lab Results  Component Value Date   PLT 198 05/26/2022     COVID-19 Labs  No results for input(s): "DDIMER", "FERRITIN", "LDH", "CRP" in the last 72 hours.  Lab Results  Component Value Date   SARSCOV2NAA NEGATIVE 06/27/2019   [place] NEGATIVE 06/15/2019   SARSCOV2NAA NOT DETECTED 01/18/2019   SARSCOV2NAA NOT DETECTED 12/21/2018       Recent Labs  Lab 05/23/22 2023 05/23/22 2031 05/26/22 2025 05/26/22 2037  WBC 7.9  --  10.2  --   NEUTROABS 4.7  --   --   --   HGB 14.3 13.9 14.5 14.3  HCT 44.5 41.0 43.1 42.0  MCV 91.9  --  89.0  --   PLT 198  --  198  --     HG/HCT   stable,     Component Value Date/Time   HGB 14.3 05/26/2022 2037   HGB 12.3 05/27/2019 1421   HCT 42.0 05/26/2022 2037   HCT 37.0 05/27/2019 1421   MCV 89.0 05/26/2022  2025   MCV 94 05/27/2019 1421     Cardiac Panel (last 3 results) Recent Labs    05/26/22 2025  CKTOTAL 43    DM  labs:  HbA1C: Recent Labs    10/17/21 0257  HGBA1C 8.3*      CBG (last 3)  Recent Labs    05/23/22 2336  GLUCAP 86    Cultures:    Component Value Date/Time   SDES BLOOD RIGHT ANTECUBITAL 01/25/2019 1019   SPECREQUEST  01/25/2019 1019    BOTTLES DRAWN AEROBIC AND ANAEROBIC Blood Culture adequate volume   CULT  01/25/2019 1019    NO GROWTH 5 DAYS Performed at [facility], [place] [address], [place], [place] [address]    REPTSTATUS 01/30/2019 FINAL 01/25/2019 1019     Radiological Exams on Admission: CT CERVICAL SPINE WO CONTRAST  Result Date: 05/26/2022 CLINICAL DATA:  Fall.  Polytrauma, blunt EXAM: CT CERVICAL SPINE WITHOUT CONTRAST TECHNIQUE:  Multidetector CT imaging of the cervical spine was performed without intravenous contrast. Multiplanar CT image reconstructions were also generated. RADIATION DOSE REDUCTION: This exam was performed according to the departmental dose-optimization program which includes automated exposure control, adjustment of the mA and/or kV according to patient size and/or use of iterative reconstruction technique. COMPARISON:  CTA head neck 01/13/2021 FINDINGS: Alignment: Normal. Skull base and vertebrae: Craniocervical alignment is normal. The atlantodental interval is not widened. Degenerative changes are seen surrounding the odontoid process without significant associated canal stenosis. No acute fracture of the cervical spine. Vertebral body height is preserved. Soft tissues and spinal canal: No prevertebral fluid or swelling. No visible canal hematoma. Disc levels: There is diffuse intervertebral disc space narrowing, endplate remodeling, disc calcification throughout the cervical spine in keeping with changes of advanced degenerative disc disease. The spinal canal is widely patent. Multilevel uncovertebral and facet arthrosis  results in multilevel moderate to severe neuroforaminal narrowing, most severe on the right at C5-6. Upper chest: Negative. Other: 18 mm left thyroid nodule, stable since prior examination though mildly enlarged since remote prior examination of 09/27/2012. IMPRESSION: 1. No acute fracture or listhesis of the cervical spine. 2. 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 3. Stable left thyroid nodule. Recommend thyroid US (ref: J Am Coll Radiol. 2015 Feb;12(2): 143-50). Electronically Signed   By: Fidela Salisbury M.D.   On: 05/26/2022 21:24   CT HEAD WO CONTRAST  Result Date: 05/26/2022 CLINICAL DATA:  Trauma EXAM: CT HEAD WITHOUT CONTRAST TECHNIQUE: Contiguous axial images were obtained from the base of the skull through the vertex without intravenous contrast. RADIATION DOSE REDUCTION: This exam was performed according to the departmental dose-optimization program which includes automated exposure control, adjustment of the mA and/or kV according to patient size and/or use of iterative reconstruction technique. COMPARISON:  Head CT 05/23/2022 FINDINGS: Brain: No evidence of acute infarction, hemorrhage, hydrocephalus, extra-axial collection or mass lesion/mass effect. Again seen is mild diffuse atrophy. Vascular: Atherosclerotic calcifications are present within the cavernous internal carotid arteries. Skull: Normal. Negative for fracture or focal lesion. Sinuses/Orbits: No acute finding. Other: None. IMPRESSION: 1. No acute intracranial process. 2. Mild diffuse atrophy. Electronically Signed   By: Ronney Asters M.D.   On: 05/26/2022 21:16   DG Knee Right Port  Result Date: 05/26/2022 CLINICAL DATA:  Blunt trauma, fall. EXAM: PORTABLE RIGHT KNEE - 1-2 VIEW COMPARISON:  None Available. FINDINGS: No acute fracture or dislocation. Severe tricompartmental degenerative changes are noted  at the knee. There is a small suprapatellar joint effusion containing coarse calcifications, likely loose bodies. Vascular calcifications and vascular stent are noted in the soft tissues. IMPRESSION: 1. No acute fracture or dislocation. 2. Severe tricompartmental degenerative changes. 3. Small suprapatellar joint effusion with loose bodies. Electronically Signed   By: Brett Fairy M.D.   On: 05/26/2022 21:00   DG Hip Unilat W or Wo Pelvis 2-3 Views Right  Result Date: 05/26/2022 CLINICAL DATA:  Trauma, fall. EXAM: DG HIP (WITH OR WITHOUT PELVIS) 2-3V RIGHT COMPARISON:  12/19/2021. FINDINGS: There is a mildly displaced and comminuted intertrochanteric fracture of the proximal right femur. There are old fractures of the superior pubic ramus on the right and inferior pubic ramus on the left. No dislocation at the hips bilaterally. Mild degenerative changes are noted at the hips bilaterally and lower lumbar spine. Vascular calcifications and stent are noted in the right lower extremity. IMPRESSION:  1. Mildly displaced and comminuted intertrochanteric fracture of the right hip. 2. Old fractures of the superior pubic ramus on the right and inferior pubic ramus on the left. Electronically Signed   By: Brett Fairy M.D.   On: 05/26/2022 20:58   DG Chest Port 1 View  Result Date: 05/26/2022 CLINICAL DATA:  Level 2 trauma, fall. EXAM: PORTABLE CHEST 1 VIEW COMPARISON:  01/22/2019. FINDINGS: Heart is mildly enlarged and the mediastinal contour stable. There is atherosclerotic calcification of the aorta. A TAVR stent, prosthetic cardiac valves and left atrial appendage clip are noted. Lung volumes are low. No consolidation, effusion, or pneumothorax. Sternotomy wires are present over the midline. No acute osseous abnormality. IMPRESSION: No active disease. Electronically Signed   By: Brett Fairy M.D.   On: 05/26/2022 20:55    _______________________________________________________________________________________________________ Latest  Blood pressure (!) 150/59, pulse 65, temperature (!) 97.3 F (36.3 C), temperature source Axillary, resp. rate (!) 22, height '5\' 7"'$  (1.702 m), weight 66.2 kg, SpO2 99 %.   Vitals  labs and radiology finding personally reviewed  Review of Systems:    Pertinent positives include: fall, confusion  Constitutional:  No weight loss, night sweats, Fevers, chills, fatigue, weight loss  HEENT:  No headaches, Difficulty swallowing,Tooth/dental problems,Sore throat,  No sneezing, itching, ear ache, nasal congestion, post nasal drip,  Cardio-vascular:  No chest pain, Orthopnea, PND, anasarca, dizziness, palpitations.no Bilateral lower extremity swelling  GI:  No heartburn, indigestion, abdominal pain, nausea, vomiting, diarrhea, change in bowel habits, loss of appetite, melena, blood in stool, hematemesis Resp:  no shortness of breath at rest. No dyspnea on exertion, No excess mucus, no productive cough, No non-productive cough, No coughing up of blood.No change in color of mucus.No wheezing. Skin:  no rash or lesions. No jaundice GU:  no dysuria, change in color of urine, no urgency or frequency. No straining to urinate.  No flank pain.  Musculoskeletal:  No joint pain or no joint swelling. No decreased range of motion. No back pain.  Psych:  No change in mood or affect. No depression or anxiety. No memory loss.  Neuro: no localizing neurological complaints, no tingling, no weakness, no double vision, no gait abnormality, no slurred speech, no confusion  All systems reviewed and apart from [place] all are negative _______________________________________________________________________________________________ Past Medical History:   Past Medical History:  Diagnosis Date   Acute CVA (cerebrovascular accident) ([place]) 01/24/2019   Anxiety    Arthritis    knees   Atrial fibrillation  (HCC)    CAD (coronary artery disease)    a. pre TAVR cath 2020 mild nonobstructive CAD in the LAD and CX with moderate nononstructive disease in the mid-distal RCA and PDA.   Cancer ([place])    left breast   Cataracts, bilateral    CKD (chronic kidney disease), stage III (HCC)    CVA (cerebral vascular accident) ([place])    Depression    Diabetes mellitus    DVT (deep venous thrombosis) (HCC)    hx of   Dyspnea    with exertion   Full dentures    GERD (gastroesophageal reflux disease)    Groin hematoma    Hearing aid worn    B/L   History of TIA (transient ischemic attack)    mini stroke with vision problems   Hypercholesterolemia    Hypertension    Hypertensive nephropathy    MCI (mild cognitive impairment)    Memory loss    Middle cerebral artery stenosis, left  Mitral regurgitation    Paroxysmal atrial fibrillation (HCC)    Pneumonia    PONV (postoperative nausea and vomiting)    Premature atrial contraction    S/P Maze operation for atrial fibrillation 10/11/2012   Complete bilateral atrial lesion set using bipolar radiofrequency and cryothermy ablation with clipping of LA appendage   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   78m Sorin Memo 3D ring annuloplasty with 26 mm Edwards mc3 tricuspid ring annuloplasty    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   s/p TAVR with a 23 Edwards Sapien 3 Ultra THV via the TF approach   Thyroid nodule    Tricuspid regurgitation    Wears glasses       Past Surgical History:  Procedure Laterality Date   ABDOMINAL AORTOGRAM W/LOWER EXTREMITY N/A 03/14/2022   Procedure: ABDOMINAL AORTOGRAM W/LOWER EXTREMITY;  Surgeon: HCherre Robins MD;  [facility];  Service: Cardiovascular;  Laterality: N/A;   ARTERY REPAIR Right 01/22/2019   Procedure: Right Common Femoral Artery Repair;  Surgeon: DAngelia Mould MD;  [facility]  Service: Vascular;  Laterality: Right;   CARDIAC CATHETERIZATION   09-05-12   CARDIOVERSION N/A 07/31/2012   Procedure: CARDIOVERSION;  Surgeon: PJosue Hector MD;  [facility]  Service: Cardiovascular;  Laterality: N/A;   CATARACT EXTRACTION W/ INTRAOCULAR LENS  IMPLANT, BILATERAL     CESAREAN SECTION     x 2   COLONOSCOPY WITH PROPOFOL N/A 11/28/2014   Procedure: COLONOSCOPY WITH PROPOFOL;  Surgeon: PCarol Ada MD;  [facility];  Service: Endoscopy;  Laterality: N/A;   COLONOSCOPY WITH PROPOFOL N/A 01/22/2021   Procedure: COLONOSCOPY WITH PROPOFOL;  Surgeon: HCarol Ada MD;  [facility];  Service: Endoscopy;  Laterality: N/A;   DILATION AND CURETTAGE OF UTERUS     FEMORAL ARTERY EXPLORATION Right 01/22/2019   Procedure: RIGHT GROIN EXPLORATION with evacuation of hematoma;  Surgeon: DAngelia Mould MD;  [facility]  Service: Vascular;  Laterality: Right;   FLEXIBLE SIGMOIDOSCOPY N/A 12/18/2015   Procedure: FLEXIBLE SIGMOIDOSCOPY;  Surgeon: PCarol Ada MD;  [facility];  Service: Endoscopy;  Laterality: N/A;   INTRAOPERATIVE TRANSESOPHAGEAL ECHOCARDIOGRAM N/A 10/11/2012   Procedure: INTRAOPERATIVE TRANSESOPHAGEAL ECHOCARDIOGRAM;  Surgeon: CRexene Alberts MD;  [facility]  Service: Open Heart Surgery;  Laterality: N/A;   IR ANGIO INTRA EXTRACRAN SEL COM CAROTID INNOMINATE BILAT MOD SED  05/21/2019   IR ANGIO VERTEBRAL SEL VERTEBRAL BILAT MOD SED  05/21/2019   MASTECTOMY Left 1982   MAZE N/A 10/11/2012   Procedure: MAZE;  Surgeon: CRexene Alberts MD;  [facility]  Service: Open Heart Surgery;  Laterality: N/A;   MITRAL VALVE REPAIR N/A 10/11/2012   Procedure: MITRAL VALVE REPAIR (MVR);  Surgeon: CRexene Alberts MD;  [facility]  Service: Open Heart Surgery;  Laterality: N/A;   MULTIPLE TOOTH EXTRACTIONS     PERIPHERAL VASCULAR BALLOON ANGIOPLASTY Right 03/14/2022   Procedure: PERIPHERAL VASCULAR BALLOON ANGIOPLASTY;  Surgeon: HCherre Robins MD;  [facility];   Service: Cardiovascular;  Laterality: Right;  Peroneal   PERIPHERAL VASCULAR INTERVENTION Right 03/14/2022   Procedure: PERIPHERAL VASCULAR INTERVENTION;  Surgeon: HCherre Robins MD;  [facility];  Service: Cardiovascular;  Laterality: Right;  SFA   RADIOLOGY WITH ANESTHESIA N/A 06/19/2019   Procedure: STENTING;  Surgeon: DLuanne Bras MD;  [facility]  Service: Radiology;  Laterality: N/A;   RIGHT HEART  CATH AND CORONARY ANGIOGRAPHY N/A 12/24/2018   Procedure: RIGHT HEART CATH AND CORONARY ANGIOGRAPHY;  Surgeon: [name], MD;  Location: [facility];  Service: Cardiovascular;  Laterality: N/A;   TEE WITHOUT CARDIOVERSION N/A 07/31/2012   Procedure: TRANSESOPHAGEAL ECHOCARDIOGRAM (TEE);  Surgeon: Josue Hector, MD;  Location: [facility];  Service: Cardiovascular;  Laterality: N/A;   TEE WITHOUT CARDIOVERSION N/A 01/22/2019   Procedure: TRANSESOPHAGEAL ECHOCARDIOGRAM (TEE);  Surgeon: [name], MD;  Location: [facility];  Service: Open Heart Surgery;  Laterality: N/A;   TRANSCATHETER AORTIC VALVE REPLACEMENT, TRANSFEMORAL N/A 01/22/2019   Procedure: TRANSCATHETER AORTIC VALVE REPLACEMENT, TRANSFEMORAL;  Surgeon: [name], MD;  Location: [facility];  Service: Open Heart Surgery;  Laterality: N/A;   TRICUSPID VALVE REPLACEMENT N/A 10/11/2012   Procedure: TRICUSPID VALVE REPAIR;  Surgeon: Rexene Alberts, MD;  Location: [facility];  Service: Open Heart Surgery;  Laterality: N/A;   TUBAL LIGATION      Social History:  Ambulatory  walker      reports that she has an unknown smoking status. She has never been exposed to tobacco smoke. She has never used smokeless tobacco. She reports that she does not drink alcohol and does not use drugs.     Family History:   Family History  Problem Relation Age of Onset   Alcohol abuse Mother    Cancer Father        prostate   Hypertension Father    Heart attack  Paternal Grandfather    Stroke Neg Hx    ______________________________________________________________________________________________ Allergies: Allergies  Allergen Reactions   Buspirone Hcl     lethargic/tired   Metformin And Related Diarrhea   Ozempic (0.25 Or 0.5 Mg-Dose) [Semaglutide(0.25 Or 0.'5mg'$ -Dos)]     Other reaction(s): nausea   Ultram [Tramadol] Nausea And Vomiting   Nickel Rash    Pt unable to wear jewelry made of nickel.   Pneumococcal Vaccines Swelling and Rash    At injection set     Prior to Admission medications   Medication Sig Start Date End Date Taking? Authorizing Provider  acetaminophen (TYLENOL) 500 MG tablet Take 1,000 mg by mouth every 6 (six) hours as needed for moderate pain or headache.    [provider]  apixaban (ELIQUIS) 2.5 MG TABS tablet Take 1 tablet (2.5 mg total) by mouth 2 (two) times daily. 02/28/22   [name], MD  atorvastatin (LIPITOR) 80 MG tablet Take 1 tablet by mouth once daily 10/06/21   [name], MD  Calcium Carb-Cholecalciferol (CALCIUM 600 + D PO) Take 1 tablet by mouth 2 (two) times daily.    [provider]  clopidogrel (PLAVIX) 75 MG tablet Take 1 tablet (75 mg total) by mouth daily. 03/14/22 03/14/23  Cherre Robins, MD  dapagliflozin propanediol (FARXIGA) 10 MG TABS tablet Take 10 mg by mouth daily. 03/03/20   [provider]  docusate sodium (COLACE) 100 MG capsule Take 100-200 mg by mouth daily as needed for mild constipation.    [provider]  insulin detemir (LEVEMIR) 100 UNIT/ML injection Inject 0.28 mLs (28 Units total) into the skin 2 (two) times daily. 12/24/21   [place] Eric J, DO  lidocaine (LIDODERM) 5 % Place 1 patch onto the skin daily. Remove & Discard patch within 12 hours or as directed by MD 12/24/21   [place] Donnamarie Poag, DO  Multiple Vitamins-Minerals (MULTIVITAMIN WITH MINERALS) tablet Take 1 tablet by mouth daily.  [provider]   nitroGLYCERIN (NITROSTAT) 0.4 MG SL tablet Place 1 tablet (0.4 mg total) under the tongue every 5 (five) minutes as needed for chest pain. 09/08/20 10/17/22  Richardson Dopp T, PA-C  omeprazole (PRILOSEC) 40 MG capsule Take 40 mg by mouth daily before breakfast.  06/28/12   [provider]  ondansetron (ZOFRAN) 4 MG tablet Take 4 mg by mouth every 8 (eight) hours as needed for nausea or vomiting. 12/14/21   [provider]  polyethylene glycol powder (MIRALAX) 17 GM/SCOOP powder Take 17 g by mouth 2 (two) times daily as needed for moderate constipation or mild constipation. 10/20/21   Mercy Riding, MD  venlafaxine XR (EFFEXOR-XR) 150 MG 24 hr capsule Take 150 mg by mouth daily with breakfast. Take with 75 mg capsule to equal 225 mg daily    [provider]  venlafaxine XR (EFFEXOR-XR) 75 MG 24 hr capsule Take 75 mg by mouth daily with breakfast. Take with 150 mg capsule to equal 225 mg daily    [provider]  vitamin B-12 (CYANOCOBALAMIN) 500 MCG tablet Take 500 mcg by mouth every Monday.    [provider]    ___________________________________________________________________________________________________ Physical Exam:    05/26/2022   10:30 PM 05/26/2022   10:15 PM 05/26/2022    9:30 PM  Vitals with BMI  Systolic [id number]  Diastolic 59 66 61  Pulse 65 65 65     1. General:  in No  Acute distress   Chronically ill  -appearing 2. Psychological: Alert and   Oriented 3. Head/ENT:   Dry Mucous Membranes                          Head Non traumatic, neck supple                            Poor Dentition 4. SKIN:  decreased Skin turgor,  Skin clean Dry and intact no rash 5. Heart: Regular rate and rhythm  cardiac Murmur, no Rub or gallop 6. Lungs:  Clear to auscultation bilaterally, no wheezes or crackles   7. Abdomen: Soft,  non-tender, Non distended  bowel sounds present 8. Lower extremities: no clubbing, cyanosis, no  edema 9. Neurologically  Grossly intact, moving all 4 extremities equally  10. MSK: Normal range of motion  Limited due to pain  Chart has been reviewed  ______________________________________________________________________________________________  Assessment/Plan 81 y.o. 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, mild CAD, history of CVA and severe left MCA stenosis on Aspirin, CKD stage IIIa, T2DM, HTN, HLD, depression/anxiety, dementia   Admitted for right hip fracture  Present on Admission:  Closed right hip fracture (HCC)  PAF (paroxysmal atrial fibrillation) (HCC)  Chronic kidney disease, stage 3a (HCC)  HTN (hypertension)  Fall  Lactic acidosis     PAF (paroxysmal atrial fibrillation) (HCC) Hold Eliquis for the patient may need to proceed to orthopedics procedure if needs to be off of anticoagulation for prolonged period time may need heparin bridge  History of CVA (cerebrovascular accident) Hold aspirin and Eliquis as patient will need surgical procedure  Type 2 diabetes mellitus with complication, with long-term current use of insulin ([place]) Order sliding scale hold p.o. home medications Restart levemir at lower dose 10 unit BID Monitor BG   Chronic  kidney disease, stage 3a ([place])  -chronic avoid nephrotoxic medications such as NSAIDs, Vanco Zosyn combo,  avoid hypotension, continue to follow renal function   S/P mitral valve repair Will let cardiology know the patient is being admitted may need further preop clearance given extensive cardiac disease  HTN (hypertension) Chronic stable monitor blood pressure  Fall Will need PT OT assessment likely placement after hip has been repaired  Lactic acidosis Rehydrate and recheck  S/P TVR (tricuspid valve repair) Repeat echo  Closed right hip fracture ([place])  - management as per orthopedics,  plan to operate    in  a.m.   Keep nothing by mouth post midnight. Patient  on anticoagulation or antiplatelet agents    on hold Ordered type and screen, order a vitamin D level  Patient at baseline  unable to walk a flight of stairs or 100 feet she has not been out of her apartment for 2 years  Patient denies any chest pain or shortness of breath currently and/or with exertion,   ECG showing no evidence of acute ischemia  known history of coronary artery disease, CKD  Given advanced age patient is at least moderate  risk  which has been discussed with family     will order echo CE wnl   Cardiology consult for further pre-op clearance given extensive cardiac disease     Other plan as per orders.  DVT prophylaxis:  SCD      Code Status:    Code Status: Prior FULL CODE as per patient  /family I had personally discussed CODE STATUS with patient and family     Family Communication:   Family  at  Bedside  plan of care was discussed  with   Daughter,    Disposition Plan:     likely will need placement for rehabilitation                           Following barriers for discharge:                            Hip repaired                             Pain controlled with PO medications                                              Will need consultants to evaluate patient prior to discharge                      Transition of care consulted                             Consults called:  orthopedics are aware, sent msg to cardiology   Admission status:  ED Disposition     ED Disposition  [place]: [place] [[id number]]  Level of Care: Telemetry Medical [[id number]]  May admit patient to [facility] or [facility] if equivalent level of care is available:: No  Covid Evaluation: Asymptomatic - no recent exposure (last 10 days) testing not required  Diagnosis: Closed right hip fracture ([place]) [  [id number]  Admitting Physician: Toy Baker [[id number]]   Attending Physician: Toy Baker [id number]  Certification:: I certify this patient will need inpatient services for at least 2 midnights  Estimated Length of Stay: 2            inpatient     I Expect 2 midnight stay secondary to severity of patient's current illness need for inpatient interventions justified by the following:     Severe lab/radiological/exam abnormalities including:    Right hip frx and extensive comorbidities including:    DM2   CHF  CAD    CKD  dementia    history of stroke with residual deficits   Chronic anticoagulation  That are currently affecting medical management.   I expect  patient to be hospitalized for 2 midnights requiring inpatient medical care.  Patient is at high risk for adverse outcome (such as loss of life or disability) if not treated.  Indication for inpatient stay as follows:    severe pain requiring acute inpatient management,    Need for operative/procedural  intervention     Need for  IV fluids, , IV pain medications,      Level of care     tele  For 12H       Bambi Fehnel 05/27/2022, 12:38 AM    [facility]     after 2 AM please page floor coverage PA If 7AM-7PM, please contact the day team taking care of the patient using [email or web address]   Patient was evaluated in the context of the global COVID-19 pandemic, which necessitated consideration that the patient might be at risk for infection with the SARS-CoV-2 virus that causes COVID-19. Institutional protocols and algorithms that pertain to the evaluation of patients at risk for COVID-19 are in a state of rapid change based on information released by regulatory bodies including the CDC and federal and state organizations. These policies and algorithms were followed during the patient's care.
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
## 2022-05-26 NOTE — Assessment & Plan Note (Signed)
Will let cardiology know the patient is being admitted may need further preop clearance given extensive cardiac disease

## 2022-05-26 NOTE — Assessment & Plan Note (Signed)
Chronic stable monitor blood pressure

## 2022-05-26 NOTE — Assessment & Plan Note (Signed)
Will need PT OT assessment likely placement after hip has been repaired
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
## 2022-05-26 NOTE — ED Notes (Signed)
ED TO INPATIENT HANDOFF REPORT  ED Nurse Name and Phone #: Tahisha Hakim [phone]  S Name/Age/Gender Rachel Santos 82 y.o. female Room/Bed: TRABC/TRABC  Code Status   Code Status: Full Code  Home/SNF/Other Home Patient oriented to: self, place, and time Is this baseline? Yes   Triage Complete: Triage complete  Chief Complaint Closed right hip fracture ([place]) [S72.001A]  Triage Note Pt bib [facility]ms from home for fall on thinners. C/o right hip pain - shortening reported by ems. + pedal pulses. Downtime approx 30 mins. Denies loc. C collar in place. 75 mcg fent given PTA.    Allergies Allergies  Allergen Reactions   Buspirone Hcl     lethargic/tired   Metformin And Related Diarrhea   Ozempic (0.25 Or 0.5 Mg-Dose) [Semaglutide(0.25 Or 0.'5mg'$ -Dos)]     Other reaction(s): nausea   Ultram [Tramadol] Nausea And Vomiting   Nickel Rash    Pt unable to wear jewelry made of nickel.   Pneumococcal Vaccines Swelling and Rash    At injection set    Level of Care/Admitting Diagnosis ED Disposition     ED Disposition  Admit   Condition  --   [place]: [place] [[id number]]  Level of Care: Telemetry Medical [[id number]]  May admit patient to [facility] or [facility] if equivalent level of care is available:: No  Covid Evaluation: Asymptomatic - no recent exposure (last 10 days) testing not required  Diagn[facility]: [id number]  Admitting Physician: Toy Baker [[id number]]  Attending Physician: Toy Baker [id number]  Certification:: I certify this patient will need inpatient services for at least 2 midnights  Estimated Length of Stay: 2          B Medical/Surgery History Past Medical History:  Diagnosis Date   Acute CVA (cerebrovascular accident) ([place]) 01/24/2019   Anxiety    Arthritis    knees   Atrial fibrillation ([place])    CAD (coronary artery disease)    a. pre TAVR cath 2020 mild nonobstructive CAD in the LAD  and CX with moderate nononstructive disease in the mid-distal RCA and PDA.   Cancer ([place])    left breast   Cataracts, bilateral    CKD (chronic kidney disease), stage III ([place])    CVA (cerebral vascular accident) ([place])    Depression    Diabetes mellitus    DVT (deep venous thrombosis) (H[facility])    hx of   Dyspnea    with exertion   Full dentures    GERD (gastroesophageal reflux disease)    Groin hematoma    Hearing aid worn    B/L   History of TIA (transient ischemic attack)    mini stroke with vision problems   Hypercholesterolemia    Hypertension    Hypertensive nephropathy    MCI (mild cognitive impairment)    Memory loss    Middle cerebral artery stenosis, left    Mitral regurgitation    Paroxysmal atrial fibrillation (H[facility])    Pneumonia    PONV (postoperative nausea and vomiting)    Premature atrial contraction    S/P Maze operation for atrial fibrillation 10/11/2012   Complete bilateral atrial lesion set using bipolar radiofrequency and cryothermy ablation with clipping of LA appendage   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   50m Sorin Memo 3D ring annuloplasty with 26 mm Edwards mc3 tricuspid ring annuloplasty    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   s/p TAVR with a 23 Edwards  Sapien 3 Ultra THV via the TF approach   Thyroid nodule    Tricuspid regurgitation    Wears glasses    Past Surgical History:  Procedure Laterality Date   ABDOMINAL AORTOGRAM W/LOWER EXTREMITY N/A 03/14/2022   Procedure: ABDOMINAL AORTOGRAM W/LOWER EXTREMITY;  Surgeon: Cherre Robins, MD;  Location: [place] [facility];  Service: Cardiovascular;  Laterality: N/A;   ARTERY REPAIR Right 01/22/2019   Procedure: Right Common Femoral Artery Repair;  Surgeon: Angelia Mould, MD;  Location: [place];  Service: Vascular;  Laterality: Right;   CARDIAC CATHETERIZATION  09-05-12   CARDIOVERSION N/A 07/31/2012   Procedure: CARDIOVERSION;  Surgeon: Josue Hector, MD;  Location:  [place];  Service: Cardiovascular;  Laterality: N/A;   CATARACT EXTRACTION W/ INTRAOCULAR LENS  IMPLANT, BILATERAL     CESAREAN SECTION     x 2   COLONOSCOPY WITH PROPOFOL N/A 11/28/2014   Procedure: COLONOSCOPY WITH PROPOFOL;  Surgeon: Carol Ada, MD;  [facility]: [facility];  Service: Endoscopy;  Laterality: N/A;   COLONOSCOPY WITH PROPOFOL N/A 01/22/2021   Procedure: COLONOSCOPY WITH PROPOFOL;  Surgeon: Carol Ada, MD;  [facility];  Service: Endoscopy;  Laterality: N/A;   DILATION AND CURETTAGE OF UTERUS     FEMORAL ARTERY EXPLORATION Right 01/22/2019   Procedure: RIGHT GROIN EXPLORATION with evacuation of hematoma;  Surgeon: Angelia Mould, MD;  Location: [facility];  Service: Vascular;  Laterality: Right;   FLEXIBLE SIGMOIDOSCOPY N/A 12/18/2015   Procedure: FLEXIBLE SIGMOIDOSCOPY;  Surgeon: Carol Ada, MD;  [facility];  Service: Endoscopy;  Laterality: N/A;   INTRAOPERATIVE TRANSESOPHAGEAL ECHOCARDIOGRAM N/A 10/11/2012   Procedure: INTRAOPERATIVE TRANSESOPHAGEAL ECHOCARDIOGRAM;  Surgeon: Rexene Alberts, MD;  Location: [place];  Service: Open Heart Surgery;  Laterality: N/A;   IR ANGIO INTRA EXTRACRAN SEL COM CAROTID INNOMINATE BILAT MOD SED  05/21/2019   IR ANGIO VERTEBRAL SEL VERTEBRAL BILAT MOD SED  05/21/2019   MASTECTOMY Left 1982   MAZE N/A 10/11/2012   Procedure: MAZE;  Surgeon: Rexene Alberts, MD;  Location: [facility];  Service: Open Heart Surgery;  Laterality: N/A;   MITRAL VALVE REPAIR N/A 10/11/2012   Procedure: MITRAL VALVE REPAIR (MVR);  Surgeon: Rexene Alberts, MD;  Location: [facility];  Service: Open Heart Surgery;  Laterality: N/A;   MULTIPLE TOOTH EXTRACTIONS     PERIPHERAL VASCULAR BALLOON ANGIOPLASTY Right 03/14/2022   Procedure: PERIPHERAL VASCULAR BALLOON ANGIOPLASTY;  Surgeon: Cherre Robins, MD;  Location: [place] [facility];  Service: Cardiovascular;  Laterality: Right;  Peroneal   PERIPHERAL VASCULAR INTERVENTION Right 03/14/2022    Procedure: PERIPHERAL VASCULAR INTERVENTION;  Surgeon: Cherre Robins, MD;  Location: [facility];  Service: Cardiovascular;  Laterality: Right;  SFA   RADIOLOGY WITH ANESTHESIA N/A 06/19/2019   Procedure: STENTING;  Surgeon: Luanne Bras, MD;  Location: [facility];  Service: Radiology;  Laterality: N/A;   RIGHT HEART CATH AND CORONARY ANGIOGRAPHY N/A 12/24/2018   Procedure: RIGHT HEART CATH AND CORONARY ANGIOGRAPHY;  Surgeon: [name], MD;  Location: [facility] [facility];  Service: Cardiovascular;  Laterality: N/A;   TEE WITHOUT CARDIOVERSION N/A 07/31/2012   Procedure: TRANSESOPHAGEAL ECHOCARDIOGRAM (TEE);  Surgeon: Josue Hector, MD;  Location: [place];  Service: Cardiovascular;  Laterality: N/A;   TEE WITHOUT CARDIOVERSION N/A 01/22/2019   Procedure: TRANSESOPHAGEAL ECHOCARDIOGRAM (TEE);  Surgeon: [name], MD;  Location: [place] [facility];  Service: Open Heart Surgery;  Laterality: N/A;   TRANSCATHETER AORTIC  VALVE REPLACEMENT, TRANSFEMORAL N/A 01/22/2019   Procedure: TRANSCATHETER AORTIC VALVE REPLACEMENT, TRANSFEMORAL;  Surgeon: [name], MD;  Location: [place];  Service: Open Heart Surgery;  Laterality: N/A;   TRICUSPID VALVE REPLACEMENT N/A 10/11/2012   Procedure: TRICUSPID VALVE REPAIR;  Surgeon: Rexene Alberts, MD;  Location: [place];  Service: Open Heart Surgery;  Laterality: N/A;   TUBAL LIGATION       A IV Location/Drains/Wounds Patient Lines/Drains/Airways Status     Active Line/Drains/Airways     Name Placement date Placement time Site Days   Peripheral IV 05/23/22 18 G Right Antecubital 05/23/22  2132  Antecubital  3   Peripheral IV 05/26/22 22 G Left Antecubital 05/26/22  --  Antecubital  less than 1            Intake/Output Last 24 hours  Intake/Output Summary (Last 24 hours) at 05/26/2022 2349 Last data filed at 05/26/2022 2312 Gross per 24 hour  Intake 500 ml  Output 0 ml  Net 500 ml     Labs/Imaging Results for orders placed or performed during the hospital encounter of 05/26/22 (from the past 48 hour(s))  Comprehensive metabolic panel     Status: Abnormal   Collection Time: 05/26/22  8:25 PM  Result Value Ref Range   Sodium 139 135 - 145 mmol/L   Potassium 4.0 3.5 - 5.1 mmol/L   Chloride 102 98 - 111 mmol/L   CO2 24 22 - 32 mmol/L   Glucose, Bld 152 (H) 70 - 99 mg/dL    Comment: Glucose reference range applies only to samples taken after fasting for at least 8 hours.   BUN 26 (H) 8 - 23 mg/dL   Creatinine, Ser 0.78 0.44 - 1.00 mg/dL   Calcium 9.5 8.9 - 10.3 mg/dL   Total Protein 6.7 6.5 - 8.1 g/dL   Albumin 3.7 3.5 - 5.0 g/dL   AST 28 15 - 41 U/L   ALT 30 0 - 44 U/L   Alkaline Phosphatase 79 38 - 126 U/L   Total Bilirubin 0.5 0.3 - 1.2 mg/dL   GFR, Estimated >60 >60 mL/min    Comment: (NOTE) Calculated using the CKD-EPI Creatinine Equation (2021)    Anion gap 13 5 - 15    Comment: Performed at [facility] [address], [place] [address]  CBC     Status: None   Collection Time: 05/26/22  8:25 PM  Result Value Ref Range   WBC 10.2 4.0 - 10.5 K/uL   RBC 4.84 3.87 - 5.11 MIL/uL   Hemoglobin 14.5 12.0 - 15.0 g/dL   HCT 43.1 36.0 - 46.0 %   MCV 89.0 80.0 - 100.0 fL   MCH 30.0 26.0 - 34.0 pg   MCHC 33.6 30.0 - 36.0 g/dL   RDW 13.5 11.5 - 15.5 %   Platelets 198 150 - 400 K/uL   nRBC 0.0 0.0 - 0.2 %    Comment: Performed at [facility], [place] [address], [place], [place] [address]  Ethanol     Status: None   Collection Time: 05/26/22  8:25 PM  Result Value Ref Range   Alcohol, Ethyl (B) <10 <10 mg/dL    Comment: (NOTE) Lowest detectable limit for serum alcohol is 10 mg/dL.  For medical purposes only. Performed at [facility], [place] [address], [place], [place] [address]   Lactic acid, plasma     Status: Abnormal   Collection Time: 05/26/22  8:25 PM  Result Value Ref Range   Lactic Acid, Venous 2.1 (HH) 0.5 - 1.9 mmol/L     Comment: CRITICAL RESULT CALLED TO, READ BACK BY AND VERIFIED WITH Jaclene Bartelt RN 05/26/22 2117 Wiliam Ke Performed at [facility], [place] [address], [place], [place] [address]   Protime-INR     Status: None   Collection Time: 05/26/22  8:25 PM  Result Value Ref Range   Prothrombin Time 13.9 11.4 - 15.2 seconds   INR 1.1 0.8 - 1.2    Comment: (NOTE) INR goal varies based on device and disease states. Performed at [facility], [place] [address] [place], [place] [address]   Sample to Blood Bank     Status: None   Collection Time: 05/26/22  8:25 PM  Result Value Ref Range   Blood Bank Specimen SAMPLE AVAILABLE FOR TESTING    Sample Expiration      05/29/2022,2359 Performed at [facility], [place] [address] [place], [place] [address]   CK     Status: None   Collection Time: 05/26/22  8:25 PM  Result Value Ref Range   Total CK 43 38 - 234 U/L    Comment: Performed at [facility], [place] [address], [place], [place] [address]  Troponin I (High Sensitivity)     Status: None   Collection Time: 05/26/22  8:25 PM  Result Value Ref Range   Troponin I (High Sensitivity) 10 <18 ng/L    Comment: (NOTE) Elevated high sensitivity troponin I (hsTnI) values and significant  changes across serial measurements may suggest ACS but many other  chronic and acute conditions are known to elevate hsTnI results.  Refer to the "Links" section for chest pain algorithms and additional  guidance. Performed at [facility], [place] [address], [place], [place] [address]   Type and screen     Status: None   Collection Time: 05/26/22  8:25 PM  Result Value Ref Range   ABO/RH(D) A NEG    Antibody Screen NEG    Sample Expiration      05/29/2022,2359 Performed at [facility], [place] [address] [place], [place] [address]   I-Stat Chem 8, ED     Status: Abnormal   Collection Time: 05/26/22  8:37 PM  Result Value Ref Range   Sodium 140 135 - 145 mmol/L   Potassium 4.1 3.5 -  5.1 mmol/L   Chloride 105 98 - 111 mmol/L   BUN 26 (H) 8 - 23 mg/dL   Creatinine, Ser 0.70 0.44 - 1.00 mg/dL   Glucose, Bld 147 (H) 70 - 99 mg/dL    Comment: Glucose reference range applies only to samples taken after fasting for at least 8 hours.   Calcium, Ion 1.08 (L) 1.15 - 1.40 mmol/L   TCO2 25 22 - 32 mmol/L   Hemoglobin 14.3 12.0 - 15.0 g/dL   HCT 42.0 36.0 - 46.0 %  CBG monitoring, ED     Status: Abnormal   Collection Time: 05/26/22 11:21 PM  Result Value Ref Range   Glucose-Capillary 127 (H) 70 - 99 mg/dL    Comment: Glucose reference range applies only to samples taken after fasting for at least 8 hours.   CT CERVICAL SPINE WO CONTRAST  Result Date: 05/26/2022 CLINICAL DATA:  Fall.  Polytrauma, blunt EXAM: CT CERVICAL SPINE WITHOUT CONTRAST TECHNIQUE: Multidetector CT imaging of the cervical spine was performed without intravenous contrast. Multiplanar CT image  reconstructions were also generated. RADIATION DOSE REDUCTION: This exam was performed according to the departmental dose-optimization program which includes automated exposure control, adjustment of the mA and/or kV according to patient size and/or use of iterative reconstruction technique. COMPARISON:  CTA head neck 01/13/2021 FINDINGS: Alignment: Normal. Skull base and vertebrae: Craniocervical alignment is normal. The atlantodental interval is not widened. Degenerative changes are seen surrounding the odontoid process without significant associated canal stenosis. No acute fracture of the cervical spine. Vertebral body height is preserved. Soft tissues and spinal canal: No prevertebral fluid or swelling. No visible canal hematoma. Disc levels: There is diffuse intervertebral disc space narrowing, endplate remodeling, disc calcification throughout the cervical spine in keeping with changes of advanced degenerative disc disease. The spinal canal is widely patent. Multilevel uncovertebral and facet arthrosis results in multilevel  moderate to severe neuroforaminal narrowing, most severe on the right at C5-6. Upper chest: Negative. Other: 18 mm left thyroid nodule, stable since prior examination though mildly enlarged since remote prior examination of 09/27/2012. IMPRESSION: 1. No acute fracture or listhesis of the cervical spine. 2. 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 3. Stable left thyroid nodule. Recommend thyroid US (ref: J Am Coll Radiol. 2015 Feb;12(2): 143-50). Electronically Signed   By: Fidela Salisbury M.D.   On: 05/26/2022 21:24   CT HEAD WO CONTRAST  Result Date: 05/26/2022 CLINICAL DATA:  Trauma EXAM: CT HEAD WITHOUT CONTRAST TECHNIQUE: Contiguous axial images were obtained from the base of the skull through the vertex without intravenous contrast. RADIATION DOSE REDUCTION: This exam was performed according to the departmental dose-optimization program which includes automated exposure control, adjustment of the mA and/or kV according to patient size and/or use of iterative reconstruction technique. COMPARISON:  Head CT 05/23/2022 FINDINGS: Brain: No evidence of acute infarction, hemorrhage, hydrocephalus, extra-axial collection or mass lesion/mass effect. Again seen is mild diffuse atrophy. Vascular: Atherosclerotic calcifications are present within the cavernous internal carotid arteries. Skull: Normal. Negative for fracture or focal lesion. Sinuses/Orbits: No acute finding. Other: None. IMPRESSION: 1. No acute intracranial process. 2. Mild diffuse atrophy. Electronically Signed   By: Ronney Asters M.D.   On: 05/26/2022 21:16   DG Knee Right Port  Result Date: 05/26/2022 CLINICAL DATA:  Blunt trauma, fall. EXAM: PORTABLE RIGHT KNEE - 1-2 VIEW COMPARISON:  None Available. FINDINGS: No acute fracture or dislocation. Severe tricompartmental degenerative changes are noted at the knee. There is  a small suprapatellar joint effusion containing coarse calcifications, likely loose bodies. Vascular calcifications and vascular stent are noted in the soft tissues. IMPRESSION: 1. No acute fracture or dislocation. 2. Severe tricompartmental degenerative changes. 3. Small suprapatellar joint effusion with loose bodies. Electronically Signed   By: Brett Fairy M.D.   On: 05/26/2022 21:00   DG Hip Unilat W or Wo Pelvis 2-3 Views Right  Result Date: 05/26/2022 CLINICAL DATA:  Trauma, fall. EXAM: DG HIP (WITH OR WITHOUT PELVIS) 2-3V RIGHT COMPARISON:  12/19/2021. FINDINGS: There is a mildly displaced and comminuted intertrochanteric fracture of the proximal right femur. There are old fractures of the superior pubic ramus on the right and inferior pubic ramus on the left. No dislocation at the hips bilaterally. Mild degenerative changes are noted at the hips bilaterally and lower lumbar spine. Vascular calcifications and stent are noted in the right lower extremity. IMPRESSION: 1. Mildly displaced and comminuted intertrochanteric fracture of the right hip. 2. Old fractures of  the superior pubic ramus on the right and inferior pubic ramus on the left. Electronically Signed   By: Brett Fairy M.D.   On: 05/26/2022 20:58   DG Chest Port 1 View  Result Date: 05/26/2022 CLINICAL DATA:  Level 2 trauma, fall. EXAM: PORTABLE CHEST 1 VIEW COMPARISON:  01/22/2019. FINDINGS: Heart is mildly enlarged and the mediastinal contour stable. There is atherosclerotic calcification of the aorta. A TAVR stent, prosthetic cardiac valves and left atrial appendage clip are noted. Lung volumes are low. No consolidation, effusion, or pneumothorax. Sternotomy wires are present over the midline. No acute osseous abnormality. IMPRESSION: No active disease. Electronically Signed   By: Brett Fairy M.D.   On: 05/26/2022 20:55    Pending Labs Unresulted Labs (From admission, onward)     Start     Ordered   05/27/22 0500  Prealbumin   Tomorrow morning,   R        05/26/22 2253   05/27/22 0500  VITAMIN D 25 Hydroxy (Vit-D Deficiency, Fractures)  Tomorrow morning,   R        05/26/22 2309   05/26/22 2256  Hemoglobin A1c  Once,   R       Comments: To assess prior glycemic control    05/26/22 2255   05/26/22 2252  Lactic acid, plasma  Now then every 2 hours,   R      05/26/22 2252   05/26/22 2251  Magnesium  Add-on,   AD        05/26/22 2253   05/26/22 2251  Phosphorus  Add-on,   AD        05/26/22 2253   05/26/22 2251  TSH  Add-on,   AD        05/26/22 2253   05/26/22 2251  Urinalysis, Complete w Microscopic -Urine, Clean Catch  Once,   R       Question Answer Comment  Release to patient Immediate   Specimen Source Urine, Clean Catch      05/26/22 2253   05/26/22 2027  Urinalysis, Routine w reflex microscopic -Urine, Clean Catch  (Trauma Panel)  Once,   URGENT       Question:  Specimen Source  Answer:  Urine, Clean Catch   05/26/22 2027            Vitals/Pain Today's Vitals   05/26/22 2115 05/26/22 2130 05/26/22 2215 05/26/22 2230  BP: (!) 141/64 (!) 162/61 (!) 150/66 (!) 150/59  Pulse: 64 65 65 65  Resp: 20 (!) 24 (!) 21 (!) 22  Temp:      TempSrc:      SpO2: 97% 98% 99% 99%  Weight:      Height:      PainSc:        Isolation Precautions No active isolations  Medications Medications  insulin aspart (novoLOG) injection 0-9 Units (1 Units Subcutaneous Given 05/26/22 2331)  atorvastatin (LIPITOR) tablet 80 mg (has no administration in time range)  HYDROcodone-acetaminophen (NORCO/VICODIN) 5-325 MG per tablet 1-2 tablet (1 tablet Oral Given 05/26/22 2331)  morphine (PF) 2 MG/ML injection 0.5 mg (has no administration in time range)  methocarbamol (ROBAXIN) tablet 500 mg (has no administration in time range)    Or  methocarbamol (ROBAXIN) 500 mg in dextrose 5 % 50 mL IVPB (has no administration in time range)  venlafaxine XR (EFFEXOR-XR) 24 hr capsule 225 mg (has no administration in time range)   lactated ringers bolus 500 mL (0 mLs Intravenous Stopped  05/26/22 2312)    Mobility non-ambulatory       R Recommendations: See Admitting Provider Note  Report given to:   Additional Notes:

## 2022-05-26 NOTE — Progress Notes (Addendum)
Consult request received for right hip fracture. Multiple medical problems, including afib, anticoagulation, DM, aortic stenosis, TAVR, renal disease, orthostasis, with eval for stroke just two days ago in ED; now new fall with right hip intertroch fracture. Pelvic x-ray also suggests possibility of fecal impaction. Prior history notes dementia and living alone with frequent family checks.   Full consult to follow tomorrow.  Proceed with surgical repair when cleared and stable. Will make npo p MN in case. Significant need for discharge and long term planning.  Rachel [place], MD [facility], [facility] [phone]

## 2022-05-26 NOTE — Assessment & Plan Note (Addendum)
Order sliding scale hold p.o. home medications Restart levemir at lower dose 10 unit BID Monitor BG

## 2022-05-26 NOTE — Assessment & Plan Note (Signed)
-  chronic avoid nephrotoxic medications such as NSAIDs, Vanco Zosyn combo,  avoid hypotension, continue to follow renal function  

## 2022-05-27 ENCOUNTER — Inpatient Hospital Stay (HOSPITAL_COMMUNITY): Payer: Medicare Other | Admitting: Anesthesiology

## 2022-05-27 ENCOUNTER — Inpatient Hospital Stay (HOSPITAL_COMMUNITY): Payer: Medicare Other

## 2022-05-27 ENCOUNTER — Encounter (HOSPITAL_COMMUNITY): Payer: Self-pay | Admitting: Internal Medicine

## 2022-05-27 DIAGNOSIS — Z01818 Encounter for other preprocedural examination: Secondary | ICD-10-CM

## 2022-05-27 DIAGNOSIS — I10 Essential (primary) hypertension: Secondary | ICD-10-CM

## 2022-05-27 DIAGNOSIS — E872 Acidosis, unspecified: Secondary | ICD-10-CM | POA: Diagnosis present

## 2022-05-27 DIAGNOSIS — T82897D Other specified complication of cardiac prosthetic devices, implants and grafts, subsequent encounter: Secondary | ICD-10-CM

## 2022-05-27 LAB — VITAMIN D 25 HYDROXY (VIT D DEFICIENCY, FRACTURES): Vit D, 25-Hydroxy: 45.53 ng/mL (ref 30–100)

## 2022-05-27 LAB — CBG MONITORING, ED: Glucose-Capillary: 149 mg/dL — ABNORMAL HIGH (ref 70–99)

## 2022-05-27 LAB — MAGNESIUM: Magnesium: 1.9 mg/dL (ref 1.7–2.4)

## 2022-05-27 LAB — GLUCOSE, CAPILLARY
Glucose-Capillary: 200 mg/dL — ABNORMAL HIGH (ref 70–99)
Glucose-Capillary: 118 mg/dL — ABNORMAL HIGH (ref 70–99)
Glucose-Capillary: 133 mg/dL — ABNORMAL HIGH (ref 70–99)
Glucose-Capillary: 252 mg/dL — ABNORMAL HIGH (ref 70–99)
Glucose-Capillary: 257 mg/dL — ABNORMAL HIGH (ref 70–99)

## 2022-05-27 LAB — TROPONIN I (HIGH SENSITIVITY): Troponin I (High Sensitivity): 10 ng/L (ref <18)

## 2022-05-27 LAB — PHOSPHORUS: Phosphorus: 3.4 mg/dL (ref 2.5–4.6)

## 2022-05-27 LAB — TSH: TSH: 2.375 u[IU]/mL (ref 0.350–4.500)

## 2022-05-27 LAB — LACTIC ACID, PLASMA
Lactic Acid, Venous: 1.5 mmol/L (ref 0.5–1.9)
Lactic Acid, Venous: 0.9 mmol/L (ref 0.5–1.9)

## 2022-05-27 LAB — PREALBUMIN: Prealbumin: 22 mg/dL (ref 18–38)

## 2022-05-27 MED ORDER — FENTANYL CITRATE (PF) 100 MCG/2ML IJ SOLN
INTRAMUSCULAR | Status: AC
  Filled 2022-05-27: qty 2

## 2022-05-27 MED ORDER — ROPIVACAINE HCL 5 MG/ML IJ SOLN
INTRAMUSCULAR | Status: DC | PRN
  Administered 2022-05-27: 30 mL via PERINEURAL

## 2022-05-27 MED ORDER — INSULIN DETEMIR 100 UNIT/ML ~~LOC~~ SOLN
10.0000 [IU] | Freq: Two times a day (BID) | SUBCUTANEOUS | Status: DC
  Administered 2022-05-27 – 2022-05-30 (×8): 10 [IU] via SUBCUTANEOUS
  Filled 2022-05-27 (×12): qty 0.1

## 2022-05-27 MED ORDER — MIDAZOLAM HCL 2 MG/2ML IJ SOLN
INTRAMUSCULAR | Status: AC
  Filled 2022-05-27: qty 2

## 2022-05-27 NOTE — Progress Notes (Signed)
PROGRESS NOTE    Rachel Santos  [id number] DOB: 1940/09/18 DOA: 05/26/2022 PCP: Charlane Ferretti, MD    Brief Narrative:   Rachel Santos is a 82 y.o. female with past medical history significant for paroxysmal atrial fibrillation on Eliquis, severe aortic stenosis s/p TAVR, mitral/tricuspid valve disease s/p repair, CAD, history of CVA with severe left MCA stenosis, CKD stage IIIa, type 2 diabetes mellitus, essential hypertension, hyperlipidemia, depression/anxiety, dementia who presented to [facility] ED on 2/29 complaining of right hip pain after fall at home.  Patient currently lives alone with daughter checking on her twice daily.  Life alert was activated and EMS had to forcibly enter her residence.  Approximately down for 30 minutes.  Denies loss of consciousness.  Patient was given IV fentanyl and transferred by EMS to ED for further evaluation.  In the ED, temperature 97.3 F, HR 74, RR 11, BP 132/68, SpO2 98% on room air.  Sodium 139, potassium 4.0, chloride 102, CO2 24, glucose 152, BUN 26, creatinine 0.78.  AST 28, ALT 30, total bilirubin 0.5.  CK 43, high sensitive troponin 10 followed by 10.  Lactic acid 2.1.  WBC 10.2, hemoglobin 14.5, platelets 198.  EtOH level less than 10.  CT head without contrast with no acute intracranial process, mild diffuse atrophy.  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  Chest x-ray with no active cardiopulmonary disease process.  Right hip/pelvis x-ray with mildly displaced and comminuted intratrochanteric fracture of the right hip, old fracture of the superior pubic ramus on the right and inferior pubic ramus on the left.  Right knee x-ray with no acute fracture or dislocation, severe tricompartmental degenerative changes, small suprapatellar joint effusion with loose  bodies.  Orthopedics was consulted.  TRH consulted for admission for further evaluation management of acute right hip fracture.  Assessment & Plan:   Displaced/comminuted intratrochanteric fracture right hip Patient presenting to the ED via EMS after mechanical fall at home with immediate right hip pain and inability to ambulate. Right hip/pelvis x-ray with mildly displaced and comminuted intratrochanteric fracture of the right hip.  -- Orthopedics following, appreciate assistance -- Cardiology consulted for preoperative evaluation at the request of orthopedics -- Vitamin D 25 hydroxy level 45.53 -- Holding Eliquis -- Norco 1-2 tablets every 6 hours as needed moderate pain -- Morphine 0.5 mg IV every 2 hours as needed severe pain -- Robaxin 5 mg p.o./IV every 6 hours as needed muscle spasms -- N.p.o.; awaiting timing of surgical intervention  Paroxysmal atrial fibrillation on Eliquis -- Holding Eliquis -- Monitor on telemetry  Severe aortic stenosis s/p TAVR Mitral/tricuspid valve disease s/p repair, CAD Chronic diastolic congestive heart failure, compensated Hx essential hypertension TTE 01/30/2020 with LVEF 60 to 65%, LV with normal function, LV with no regional wall motion abnormalities, mild LVH, grade 2 diastolic dysfunction, LA moderately dilated, trivial TR/MR. Currently not on antihypertensives outpatient. -- Repeat TTE: Pending  Hx of CVA with severe left MCA stenosis Hx TIA Recent MR brain 05/24/2022 with no acute findings. -- Continue statin -- Holding Plavix, Eliquis  CKD stage IIIa Creatinine 0.70, at baseline; stable  Type 2 diabetes mellitus with hyperglycemia Home regimen includes Levemir 28 units BID, Farxiga.  Hemoglobin A1c 8.3 on 10/17/2021, not optimally controlled. -- Levemir 10 units [place] twice daily -- Sensitive SSI for coverage -- CBGs q4h while NPO  Hyperlipidemia -- Atorvastatin 80 mg p.o. daily  Depression/anxiety -- Venlafaxine XR to 25 mg p.o.  daily  Dementia --Delirium precautions --Get up during the day --Encourage a familiar face to remain present throughout the day --Keep blinds open and lights on during daylight hours --Minimize the use of opioids/benzodiazepines  DVT prophylaxis: SCDs Start: 05/26/22 2310    Code Status: Full Code Family Communication: Daughter present at bedside this morning  Disposition Plan:  Level of care: Telemetry Medical Status is: Inpatient Remains inpatient appropriate because: Pending surgical invention for right hip fracture    Consultants:  Orthopedics Cardiology  Procedures:  TTE: Pending  Antimicrobials:  None   Subjective: Patient seen examined bedside, resting comfortably.  Lying in bed.  Daughter present.  Complains of pain to right hip with any type of movement.  Awaiting cardiology evaluation and orthopedics to determine timing of surgery.  Remains NPO.  Echocardiogram pending.  No other questions or concerns at this time.  Denies headache, no dizziness, no chest pain, no shortness of breath, no abdominal pain, no fever/chills/night sweats, no nausea/vomiting/diarrhea, no cough/congestion, no fatigue, no paresthesias.  No acute events overnight per nursing staff.  Objective: Vitals:   05/27/22 0727 05/27/22 0951 05/27/22 1051 05/27/22 1054  BP: (!) 117/59     Pulse: 66 64 72   Resp: 16     Temp: 99.1 F (37.3 C) 97.6 F (36.4 C) (!) 97.3 F (36.3 C)   TempSrc: Oral     SpO2: 98% 97%  95%  Weight:      Height:        Intake/Output Summary (Last 24 hours) at 05/27/2022 1109 Last data filed at 05/26/2022 2312 Gross per 24 hour  Intake 500 ml  Output 0 ml  Net 500 ml   Filed Weights   05/26/22 2031 05/27/22 0216  Weight: 66.2 kg 60.3 kg    Examination:  Physical Exam: GEN: NAD, alert and oriented to place (hospital), and clearly identifies her daughter in the room, but not oriented to time HEENT: NCAT, PERRL, EOMI, sclera clear, MMM PULM: CTAB w/o  wheezes/crackles, normal respiratory effort, on room air CV: RRR w/o M/G/R GI: abd soft, NTND, NABS, no R/G/M MSK: no peripheral edema, right lower extremity shortened, externally rotated, neurovascularly intact NEURO: CN II-XII intact, no focal deficits, sensation to light touch intact PSYCH: normal mood/affect Integumentary: dry/intact, no rashes or wounds    Data Reviewed: I have personally reviewed following labs and imaging studies  CBC: Recent Labs  Lab 05/23/22 2023 05/23/22 2031 05/26/22 2025 05/26/22 2037  WBC 7.9  --  10.2  --   NEUTROABS 4.7  --   --   --   HGB 14.3 13.9 14.5 14.3  HCT 44.5 41.0 43.1 42.0  MCV 91.9  --  89.0  --   PLT 198  --  198  --    Basic Metabolic Panel: Recent Labs  Lab 05/23/22 2023 05/23/22 2031 05/26/22 2025 05/26/22 2037 05/26/22 2233  NA 140 140 139 140  --   K 3.7 3.7 4.0 4.1  --   CL 105 106 102 105  --   CO2 23  --  24  --   --   GLUCOSE 99 99 152* 147*  --   BUN 27* 27* 26* 26*  --   CREATININE 0.80 0.70 0.78 0.70  --   CALCIUM 9.1  --  9.5  --   --   MG  --   --   --   --  1.9  PHOS  --   --   --   --  3.4   GFR: Estimated Creatinine Clearance: 50.7 mL/min (by C-G formula based on SCr of 0.7 mg/dL). Liver Function Tests: Recent Labs  Lab 05/23/22 2023 05/26/22 2025  AST 31 28  ALT 35 30  ALKPHOS 72 79  BILITOT 0.5 0.5  PROT 6.7 6.7  ALBUMIN 3.6 3.7   No results for input(s): "LIPASE", "AMYLASE" in the last 168 hours. No results for input(s): "AMMONIA" in the last 168 hours. Coagulation Profile: Recent Labs  Lab 05/23/22 2023 05/26/22 2025  INR 1.1 1.1   Cardiac Enzymes: Recent Labs  Lab 05/26/22 2025  CKTOTAL 43   BNP (last 3 results) No results for input(s): "PROBNP" in the last 8760 hours. HbA1C: No results for input(s): "HGBA1C" in the last 72 hours. CBG: Recent Labs  Lab 05/23/22 2336 05/26/22 2321 05/27/22 0115 05/27/22 0423 05/27/22 0855  GLUCAP 86 127* 149* 200* 118*   Lipid  Profile: No results for input(s): "CHOL", "HDL", "LDLCALC", "TRIG", "CHOLHDL", "LDLDIRECT" in the last 72 hours. Thyroid Function Tests: Recent Labs    05/26/22 2320  TSH 2.375   Anemia Panel: No results for input(s): "VITAMINB12", "FOLATE", "FERRITIN", "TIBC", "IRON", "RETICCTPCT" in the last 72 hours. Sepsis Labs: Recent Labs  Lab 05/26/22 2025 05/26/22 2320 05/27/22 0115  LATICACIDVEN 2.1* 1.5 0.9    No results found for this or any previous visit (from the past 240 hour(s)).       Radiology Studies: CT CERVICAL SPINE WO CONTRAST  Result Date: 05/26/2022 CLINICAL DATA:  Fall.  Polytrauma, blunt EXAM: CT CERVICAL SPINE WITHOUT CONTRAST TECHNIQUE: Multidetector CT imaging of the cervical spine was performed without intravenous contrast. Multiplanar CT image reconstructions were also generated. RADIATION DOSE REDUCTION: This exam was performed according to the departmental dose-optimization program which includes automated exposure control, adjustment of the mA and/or kV according to patient size and/or use of iterative reconstruction technique. COMPARISON:  CTA head neck 01/13/2021 FINDINGS: Alignment: Normal. Skull base and vertebrae: Craniocervical alignment is normal. The atlantodental interval is not widened. Degenerative changes are seen surrounding the odontoid process without significant associated canal stenosis. No acute fracture of the cervical spine. Vertebral body height is preserved. Soft tissues and spinal canal: No prevertebral fluid or swelling. No visible canal hematoma. Disc levels: There is diffuse intervertebral disc space narrowing, endplate remodeling, disc calcification throughout the cervical spine in keeping with changes of advanced degenerative disc disease. The spinal canal is widely patent. Multilevel uncovertebral and facet arthrosis results in multilevel moderate to severe neuroforaminal narrowing, most severe on the right at C5-6. Upper chest: Negative.  Other: 18 mm left thyroid nodule, stable since prior examination though mildly enlarged since remote prior examination of 09/27/2012. IMPRESSION: 1. No acute fracture or listhesis of the cervical spine. 2. 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 3. Stable left thyroid nodule. Recommend thyroid US (ref: J Am Coll Radiol. 2015 Feb;12(2): 143-50). Electronically Signed   By: Fidela Salisbury M.D.   On: 05/26/2022 21:24   CT HEAD WO CONTRAST  Result Date: 05/26/2022 CLINICAL DATA:  Trauma EXAM: CT HEAD WITHOUT CONTRAST TECHNIQUE: Contiguous axial images were obtained from the base of the skull through the vertex without intravenous contrast. RADIATION DOSE REDUCTION: This exam was performed according to the departmental dose-optimization program which includes automated exposure control, adjustment of the mA and/or kV  according to patient size and/or use of iterative reconstruction technique. COMPARISON:  Head CT 05/23/2022 FINDINGS: Brain: No evidence of acute infarction, hemorrhage, hydrocephalus, extra-axial collection or mass lesion/mass effect. Again seen is mild diffuse atrophy. Vascular: Atherosclerotic calcifications are present within the cavernous internal carotid arteries. Skull: Normal. Negative for fracture or focal lesion. Sinuses/Orbits: No acute finding. Other: None. IMPRESSION: 1. No acute intracranial process. 2. Mild diffuse atrophy. Electronically Signed   By: Ronney Asters M.D.   On: 05/26/2022 21:16   DG Knee Right Port  Result Date: 05/26/2022 CLINICAL DATA:  Blunt trauma, fall. EXAM: PORTABLE RIGHT KNEE - 1-2 VIEW COMPARISON:  None Available. FINDINGS: No acute fracture or dislocation. Severe tricompartmental degenerative changes are noted at the knee. There is a small suprapatellar joint effusion containing coarse calcifications, likely loose bodies. Vascular  calcifications and vascular stent are noted in the soft tissues. IMPRESSION: 1. No acute fracture or dislocation. 2. Severe tricompartmental degenerative changes. 3. Small suprapatellar joint effusion with loose bodies. Electronically Signed   By: Brett Fairy M.D.   On: 05/26/2022 21:00   DG Hip Unilat W or Wo Pelvis 2-3 Views Right  Result Date: 05/26/2022 CLINICAL DATA:  Trauma, fall. EXAM: DG HIP (WITH OR WITHOUT PELVIS) 2-3V RIGHT COMPARISON:  12/19/2021. FINDINGS: There is a mildly displaced and comminuted intertrochanteric fracture of the proximal right femur. There are old fractures of the superior pubic ramus on the right and inferior pubic ramus on the left. No dislocation at the hips bilaterally. Mild degenerative changes are noted at the hips bilaterally and lower lumbar spine. Vascular calcifications and stent are noted in the right lower extremity. IMPRESSION: 1. Mildly displaced and comminuted intertrochanteric fracture of the right hip. 2. Old fractures of the superior pubic ramus on the right and inferior pubic ramus on the left. Electronically Signed   By: Brett Fairy M.D.   On: 05/26/2022 20:58   DG Chest Port 1 View  Result Date: 05/26/2022 CLINICAL DATA:  Level 2 trauma, fall. EXAM: PORTABLE CHEST 1 VIEW COMPARISON:  01/22/2019. FINDINGS: Heart is mildly enlarged and the mediastinal contour stable. There is atherosclerotic calcification of the aorta. A TAVR stent, prosthetic cardiac valves and left atrial appendage clip are noted. Lung volumes are low. No consolidation, effusion, or pneumothorax. Sternotomy wires are present over the midline. No acute osseous abnormality. IMPRESSION: No active disease. Electronically Signed   By: Brett Fairy M.D.   On: 05/26/2022 20:55        Scheduled Meds:  atorvastatin  80 mg Oral Daily   insulin aspart  0-9 Units Subcutaneous Q4H   insulin detemir  10 Units Subcutaneous BID   venlafaxine XR  225 mg Oral Q breakfast   Continuous  Infusions:  methocarbamol (ROBAXIN) IV       LOS: 1 day    Time spent: 52 minutes spent on chart review, discussion with nursing staff, consultants, updating family and interview/physical exam; more than 50% of that time was spent in counseling and/or coordination of care.    Ravi Tuccillo J [place] DO [facility] Available via Epic secure chat 7am-7pm After these hours, please refer to coverage provider listed on [email or web address] 05/27/2022, 11:09 AM

## 2022-05-27 NOTE — H&P (View-Only) (Signed)
Reason for Consult:Right hip fx Referring Physician: Eric [place] Time called: [id number] Time at bedside: [name] is an 82 y.o. female.  HPI: Rachel Santos fell at home. She's unsure what caused the fall, just ended up on the floor. She had immediate right hip pain and could not get up. She was brought to the ED where x-rays showed a right hip fx and orthopedic surgery was consulted. She lives alone with family close by and should be ambulating with a RW but often forgets.  Past Medical History:  Diagnosis Date   Acute CVA (cerebrovascular accident) ([place]) 01/24/2019   Anxiety    Arthritis    knees   Atrial fibrillation (HCC)    CAD (coronary artery disease)    a. pre TAVR cath 2020 mild nonobstructive CAD in the LAD and CX with moderate nononstructive disease in the mid-distal RCA and PDA.   Cancer ([place])    left breast   Cataracts, bilateral    CKD (chronic kidney disease), stage III (HCC)    CVA (cerebral vascular accident) ([place])    Depression    Diabetes mellitus    DVT (deep venous thrombosis) (HCC)    hx of   Dyspnea    with exertion   Full dentures    GERD (gastroesophageal reflux disease)    Groin hematoma    Hearing aid worn    B/L   History of TIA (transient ischemic attack)    mini stroke with vision problems   Hypercholesterolemia    Hypertension    Hypertensive nephropathy    MCI (mild cognitive impairment)    Memory loss    Middle cerebral artery stenosis, left    Mitral regurgitation    Paroxysmal atrial fibrillation (HCC)    Pneumonia    PONV (postoperative nausea and vomiting)    Premature atrial contraction    S/P Maze operation for atrial fibrillation 10/11/2012   Complete bilateral atrial lesion set using bipolar radiofrequency and cryothermy ablation with clipping of LA appendage   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   74m Sorin Memo 3D ring annuloplasty with 26 mm Edwards mc3 tricuspid ring annuloplasty    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   s/p TAVR with a 23 Edwards Sapien 3 Ultra THV via the TF approach   Thyroid nodule    Tricuspid regurgitation    Wears glasses     Past Surgical History:  Procedure Laterality Date   ABDOMINAL AORTOGRAM W/LOWER EXTREMITY N/A 03/14/2022   Procedure: ABDOMINAL AORTOGRAM W/LOWER EXTREMITY;  Surgeon: HCherre Robins MD;  [facility];  Service: Cardiovascular;  Laterality: N/A;   ARTERY REPAIR Right 01/22/2019   Procedure: Right Common Femoral Artery Repair;  Surgeon: DAngelia Mould MD;  [facility]  Service: Vascular;  Laterality: Right;   CARDIAC CATHETERIZATION  09-05-12   CARDIOVERSION N/A 07/31/2012   Procedure: CARDIOVERSION;  Surgeon: PJosue Hector MD;  [facility]  Service: Cardiovascular;  Laterality: N/A;   CATARACT EXTRACTION W/ INTRAOCULAR LENS  IMPLANT, BILATERAL     CESAREAN SECTION     x 2   COLONOSCOPY WITH PROPOFOL N/A 11/28/2014   Procedure: COLONOSCOPY WITH PROPOFOL;  Surgeon: PCarol Ada MD;  [facility];  Service: Endoscopy;  Laterality: N/A;   COLONOSCOPY WITH PROPOFOL N/A 01/22/2021   Procedure: COLONOSCOPY WITH PROPOFOL;  Surgeon: HCarol Ada MD;  [facility];  Service: Endoscopy;  Laterality: N/A;   DILATION AND CURETTAGE OF  UTERUS     FEMORAL ARTERY EXPLORATION Right 01/22/2019   Procedure: RIGHT GROIN EXPLORATION with evacuation of hematoma;  Surgeon: Angelia Mould, MD;  Location: [facility];  Service: Vascular;  Laterality: Right;   FLEXIBLE SIGMOIDOSCOPY N/A 12/18/2015   Procedure: FLEXIBLE SIGMOIDOSCOPY;  Surgeon: Carol Ada, MD;  [facility];  Service: Endoscopy;  Laterality: N/A;   INTRAOPERATIVE TRANSESOPHAGEAL ECHOCARDIOGRAM N/A 10/11/2012   Procedure: INTRAOPERATIVE TRANSESOPHAGEAL ECHOCARDIOGRAM;  Surgeon: Rexene Alberts, MD;  Location: [facility];  Service: Open Heart Surgery;  Laterality: N/A;   IR ANGIO INTRA EXTRACRAN SEL COM CAROTID  INNOMINATE BILAT MOD SED  05/21/2019   IR ANGIO VERTEBRAL SEL VERTEBRAL BILAT MOD SED  05/21/2019   MASTECTOMY Left 1982   MAZE N/A 10/11/2012   Procedure: MAZE;  Surgeon: Rexene Alberts, MD;  Location: [facility];  Service: Open Heart Surgery;  Laterality: N/A;   MITRAL VALVE REPAIR N/A 10/11/2012   Procedure: MITRAL VALVE REPAIR (MVR);  Surgeon: Rexene Alberts, MD;  Location: [facility];  Service: Open Heart Surgery;  Laterality: N/A;   MULTIPLE TOOTH EXTRACTIONS     PERIPHERAL VASCULAR BALLOON ANGIOPLASTY Right 03/14/2022   Procedure: PERIPHERAL VASCULAR BALLOON ANGIOPLASTY;  Surgeon: Cherre Robins, MD;  Location: [facility];  Service: Cardiovascular;  Laterality: Right;  Peroneal   PERIPHERAL VASCULAR INTERVENTION Right 03/14/2022   Procedure: PERIPHERAL VASCULAR INTERVENTION;  Surgeon: Cherre Robins, MD;  Location: [facility];  Service: Cardiovascular;  Laterality: Right;  SFA   RADIOLOGY WITH ANESTHESIA N/A 06/19/2019   Procedure: STENTING;  Surgeon: Luanne Bras, MD;  Location: [facility];  Service: Radiology;  Laterality: N/A;   RIGHT HEART CATH AND CORONARY ANGIOGRAPHY N/A 12/24/2018   Procedure: RIGHT HEART CATH AND CORONARY ANGIOGRAPHY;  Surgeon: [name], MD;  Location: [facility];  Service: Cardiovascular;  Laterality: N/A;   TEE WITHOUT CARDIOVERSION N/A 07/31/2012   Procedure: TRANSESOPHAGEAL ECHOCARDIOGRAM (TEE);  Surgeon: Josue Hector, MD;  Location: [facility];  Service: Cardiovascular;  Laterality: N/A;   TEE WITHOUT CARDIOVERSION N/A 01/22/2019   Procedure: TRANSESOPHAGEAL ECHOCARDIOGRAM (TEE);  Surgeon: [name], MD;  [facility];  Service: Open Heart Surgery;  Laterality: N/A;   TRANSCATHETER AORTIC VALVE REPLACEMENT, TRANSFEMORAL N/A 01/22/2019   Procedure: TRANSCATHETER AORTIC VALVE REPLACEMENT, TRANSFEMORAL;  Surgeon: [name], MD;  Location: [facility];  Service: Open Heart Surgery;   Laterality: N/A;   TRICUSPID VALVE REPLACEMENT N/A 10/11/2012   Procedure: TRICUSPID VALVE REPAIR;  Surgeon: Rexene Alberts, MD;  Location: [facility];  Service: Open Heart Surgery;  Laterality: N/A;   TUBAL LIGATION      Family History  Problem Relation Age of Onset   Alcohol abuse Mother    Cancer Father        prostate   Hypertension Father    Heart attack Paternal Grandfather    Stroke Neg Hx     Social History:  reports that she has an unknown smoking status. She has never been exposed to tobacco smoke. She has never used smokeless tobacco. She reports that she does not drink alcohol and does not use drugs.  Allergies:  Allergies  Allergen Reactions   Buspirone Hcl     lethargic/tired   Metformin And Related Diarrhea   Ozempic (0.25 Or 0.5 Mg-Dose) [Semaglutide(0.25 Or 0.'5mg'$ -Dos)]     Other reaction(s): nausea   Ultram [Tramadol] Nausea And Vomiting   Nickel Rash  Pt unable to wear jewelry made of nickel.   Pneumococcal Vaccines Swelling and Rash    At injection set    Medications: I have reviewed the patient's current medications.  Results for orders placed or performed during the hospital encounter of 05/26/22 (from the past 48 hour(s))  Comprehensive metabolic panel     Status: Abnormal   Collection Time: 05/26/22  8:25 PM  Result Value Ref Range   Sodium 139 135 - 145 mmol/L   Potassium 4.0 3.5 - 5.1 mmol/L   Chloride 102 98 - 111 mmol/L   CO2 24 22 - 32 mmol/L   Glucose, Bld 152 (H) 70 - 99 mg/dL    Comment: Glucose reference range applies only to samples taken after fasting for at least 8 hours.   BUN 26 (H) 8 - 23 mg/dL   Creatinine, Ser 0.78 0.44 - 1.00 mg/dL   Calcium 9.5 8.9 - 10.3 mg/dL   Total Protein 6.7 6.5 - 8.1 g/dL   Albumin 3.7 3.5 - 5.0 g/dL   AST 28 15 - 41 U/L   ALT 30 0 - 44 U/L   Alkaline Phosphatase 79 38 - 126 U/L   Total Bilirubin 0.5 0.3 - 1.2 mg/dL   GFR, Estimated >60 >60 mL/min    Comment: (NOTE) Calculated using the CKD-EPI  Creatinine Equation (2021)    Anion gap 13 5 - 15    Comment: Performed at [facility] [address] [place] [address]  CBC     Status: None   Collection Time: 05/26/22  8:25 PM  Result Value Ref Range   WBC 10.2 4.0 - 10.5 K/uL   RBC 4.84 3.87 - 5.11 MIL/uL   Hemoglobin 14.5 12.0 - 15.0 g/dL   HCT 43.1 36.0 - 46.0 %   MCV 89.0 80.0 - 100.0 fL   MCH 30.0 26.0 - 34.0 pg   MCHC 33.6 30.0 - 36.0 g/dL   RDW 13.5 11.5 - 15.5 %   Platelets 198 150 - 400 K/uL   nRBC 0.0 0.0 - 0.2 %    Comment: Performed at [facility], [place] [address] [place], [place] [address]  Ethanol     Status: None   Collection Time: 05/26/22  8:25 PM  Result Value Ref Range   Alcohol, Ethyl (B) <10 <10 mg/dL    Comment: (NOTE) Lowest detectable limit for serum alcohol is 10 mg/dL.  For medical purposes only. Performed at [facility], [place] [address], [place], [place] [address]   Lactic acid, plasma     Status: Abnormal   Collection Time: 05/26/22  8:25 PM  Result Value Ref Range   Lactic Acid, Venous 2.1 (HH) 0.5 - 1.9 mmol/L    Comment: CRITICAL RESULT CALLED TO, READ BACK BY AND VERIFIED WITH GRACE TATE RN 05/26/22 2[address] Ke Performed at [facility], [place] [address], [place], [place] [address]   Protime-INR     Status: None   Collection Time: 05/26/22  8:25 PM  Result Value Ref Range   Prothrombin Time 13.9 11.4 - 15.2 seconds   INR 1.1 0.8 - 1.2    Comment: (NOTE) INR goal varies based on device and disease states. Performed at [facility], [place] [address] [place], [place] [address]   Sample to Blood Bank     Status: None   Collection Time: 05/26/22  8:25 PM  Result Value Ref Range   Blood Bank Specimen  SAMPLE AVAILABLE FOR TESTING    Sample Expiration      05/29/2022,2359 Performed at [facility], [place] [address] [place], [place] [address]   CK     Status: None   Collection Time: 05/26/22  8:25 PM  Result Value Ref Range   Total CK  43 38 - 234 U/L    Comment: Performed at [facility], [place] [address] [place], [place] [address]  Troponin I (High Sensitivity)     Status: None   Collection Time: 05/26/22  8:25 PM  Result Value Ref Range   Troponin I (High Sensitivity) 10 <18 ng/L    Comment: (NOTE) Elevated high sensitivity troponin I (hsTnI) values and significant  changes across serial measurements may suggest ACS but many other  chronic and acute conditions are known to elevate hsTnI results.  Refer to the "Links" section for chest pain algorithms and additional  guidance. Performed at [facility], [place] [address], [place], [place] [address]   Type and screen     Status: None   Collection Time: 05/26/22  8:25 PM  Result Value Ref Range   ABO/RH(D) A NEG    Antibody Screen NEG    Sample Expiration      05/29/2022,2359 Performed at [facility], [place] [address], [place], [place] [address]   I-Stat Chem 8, ED     Status: Abnormal   Collection Time: 05/26/22  8:37 PM  Result Value Ref Range   Sodium 140 135 - 145 mmol/L   Potassium 4.1 3.5 - 5.1 mmol/L   Chloride 105 98 - 111 mmol/L   BUN 26 (H) 8 - 23 mg/dL   Creatinine, Ser 0.70 0.44 - 1.00 mg/dL   Glucose, Bld 147 (H) 70 - 99 mg/dL    Comment: Glucose reference range applies only to samples taken after fasting for at least 8 hours.   Calcium, Ion 1.08 (L) 1.15 - 1.40 mmol/L   TCO2 25 22 - 32 mmol/L   Hemoglobin 14.3 12.0 - 15.0 g/dL   HCT 42.0 36.0 - 46.0 %  Troponin I (High Sensitivity)     Status: None   Collection Time: 05/26/22 10:33 PM  Result Value Ref Range   Troponin I (High Sensitivity) 10 <18 ng/L    Comment: (NOTE) Elevated high sensitivity troponin I (hsTnI) values and significant  changes across serial measurements may suggest ACS but many other  chronic and acute conditions are known to elevate hsTnI results.  Refer to the "Links" section for chest pain algorithms and additional  guidance. Performed at [facility], [place] [address], [place], [place] [address]   Magnesium     Status: None   Collection Time: 05/26/22 10:33 PM  Result Value Ref Range   Magnesium 1.9 1.7 - 2.4 mg/dL    Comment: Performed at [facility] [address] [place], [place] [address]  Phosphorus     Status: None   Collection Time: 05/26/22 10:33 PM  Result Value Ref Range   Phosphorus 3.4 2.5 - 4.6 mg/dL    Comment: Performed at [facility], [place] [address] [place], [place] [address]  Lactic acid, plasma     Status: None   Collection Time: 05/26/22 11:20 PM  Result Value Ref Range   Lactic Acid, Venous 1.5 0.5 - 1.9 mmol/L    Comment: Performed at [facility] [address], [place],  [address]  TSH  Status: None   Collection Time: 05/26/22 11:20 PM  Result Value Ref Range   TSH 2.375 0.350 - 4.500 uIU/mL    Comment: Performed by a 3rd Generation assay with a functional sensitivity of <=0.01 uIU/mL. Performed at [facility], [place] [address] [place], [place], [place] [address]   CBG monitoring, ED     Status: Abnormal   Collection Time: 05/26/22 11:21 PM  Result Value Ref Range   Glucose-Capillary 127 (H) 70 - 99 mg/dL    Comment: Glucose reference range applies only to samples taken after fasting for at least 8 hours.  Lactic acid, plasma     Status: None   Collection Time: 05/27/22  1:15 AM  Result Value Ref Range   Lactic Acid, Venous 0.9 0.5 - 1.9 mmol/L    Comment: Performed at [facility] [address], [place], [place] [address]  CBG monitoring, ED     Status: Abnormal   Collection Time: 05/27/22  1:15 AM  Result Value Ref Range   Glucose-Capillary 149 (H) 70 - 99 mg/dL    Comment: Glucose reference range applies only to samples taken after fasting for at least 8 hours.  Glucose, capillary     Status: Abnormal   Collection Time: 05/27/22  4:23 AM  Result Value Ref Range   Glucose-Capillary 200 (H) 70 - 99 mg/dL    Comment: Glucose reference range applies  only to samples taken after fasting for at least 8 hours.  Prealbumin     Status: None   Collection Time: 05/27/22  6:01 AM  Result Value Ref Range   Prealbumin 22 18 - 38 mg/dL    Comment: Performed at [facility] [address] [place], [place] [address]  Glucose, capillary     Status: Abnormal   Collection Time: 05/27/22  8:55 AM  Result Value Ref Range   Glucose-Capillary 118 (H) 70 - 99 mg/dL    Comment: Glucose reference range applies only to samples taken after fasting for at least 8 hours.    CT CERVICAL SPINE WO CONTRAST  Result Date: 05/26/2022 CLINICAL DATA:  Fall.  Polytrauma, blunt EXAM: CT CERVICAL SPINE WITHOUT CONTRAST TECHNIQUE: Multidetector CT imaging of the cervical spine was performed without intravenous contrast. Multiplanar CT image reconstructions were also generated. RADIATION DOSE REDUCTION: This exam was performed according to the departmental dose-optimization program which includes automated exposure control, adjustment of the mA and/or kV according to patient size and/or use of iterative reconstruction technique. COMPARISON:  CTA head neck 01/13/2021 FINDINGS: Alignment: Normal. Skull base and vertebrae: Craniocervical alignment is normal. The atlantodental interval is not widened. Degenerative changes are seen surrounding the odontoid process without significant associated canal stenosis. No acute fracture of the cervical spine. Vertebral body height is preserved. Soft tissues and spinal canal: No prevertebral fluid or swelling. No visible canal hematoma. Disc levels: There is diffuse intervertebral disc space narrowing, endplate remodeling, disc calcification throughout the cervical spine in keeping with changes of advanced degenerative disc disease. The spinal canal is widely patent. Multilevel uncovertebral and facet arthrosis results in multilevel moderate to severe neuroforaminal narrowing, most severe on the right at C5-6. Upper chest: Negative. Other: 18  mm left thyroid nodule, stable since prior examination though mildly enlarged since remote prior examination of 09/27/2012. IMPRESSION: 1. No acute fracture or listhesis of the cervical spine. 2. Advanced multilevel degenerative disc and degenerative joint disease resulting in multilevel moderate to severe neuroforaminal narrowing, most severe on the right  at C5-6. 3. Stable left thyroid nodule. Recommend thyroid US (ref: J Am Coll Radiol. 2015 Feb;12(2): 143-50). Electronically Signed   By: Fidela Salisbury M.D.   On: 05/26/2022 21:24   CT HEAD WO CONTRAST  Result Date: 05/26/2022 CLINICAL DATA:  Trauma EXAM: CT HEAD WITHOUT CONTRAST TECHNIQUE: Contiguous axial images were obtained from the base of the skull through the vertex without intravenous contrast. RADIATION DOSE REDUCTION: This exam was performed according to the departmental dose-optimization program which includes automated exposure control, adjustment of the mA and/or kV according to patient size and/or use of iterative reconstruction technique. COMPARISON:  Head CT 05/23/2022 FINDINGS: Brain: No evidence of acute infarction, hemorrhage, hydrocephalus, extra-axial collection or mass lesion/mass effect. Again seen is mild diffuse atrophy. Vascular: Atherosclerotic calcifications are present within the cavernous internal carotid arteries. Skull: Normal. Negative for fracture or focal lesion. Sinuses/Orbits: No acute finding. Other: None. IMPRESSION: 1. No acute intracranial process. 2. Mild diffuse atrophy. Electronically Signed   By: Ronney Asters M.D.   On: 05/26/2022 21:16   DG Knee Right Port  Result Date: 05/26/2022 CLINICAL DATA:  Blunt trauma, fall. EXAM: PORTABLE RIGHT KNEE - 1-2 VIEW COMPARISON:  None Available. FINDINGS: No acute fracture or dislocation. Severe tricompartmental degenerative changes are noted at the knee. There is a small suprapatellar joint effusion containing coarse calcifications, likely loose bodies. Vascular  calcifications and vascular stent are noted in the soft tissues. IMPRESSION: 1. No acute fracture or dislocation. 2. Severe tricompartmental degenerative changes. 3. Small suprapatellar joint effusion with loose bodies. Electronically Signed   By: Brett Fairy M.D.   On: 05/26/2022 21:00   DG Hip Unilat W or Wo Pelvis 2-3 Views Right  Result Date: 05/26/2022 CLINICAL DATA:  Trauma, fall. EXAM: DG HIP (WITH OR WITHOUT PELVIS) 2-3V RIGHT COMPARISON:  12/19/2021. FINDINGS: There is a mildly displaced and comminuted intertrochanteric fracture of the proximal right femur. There are old fractures of the superior pubic ramus on the right and inferior pubic ramus on the left. No dislocation at the hips bilaterally. Mild degenerative changes are noted at the hips bilaterally and lower lumbar spine. Vascular calcifications and stent are noted in the right lower extremity. IMPRESSION: 1. Mildly displaced and comminuted intertrochanteric fracture of the right hip. 2. Old fractures of the superior pubic ramus on the right and inferior pubic ramus on the left. Electronically Signed   By: Brett Fairy M.D.   On: 05/26/2022 20:58   DG Chest Port 1 View  Result Date: 05/26/2022 CLINICAL DATA:  Level 2 trauma, fall. EXAM: PORTABLE CHEST 1 VIEW COMPARISON:  01/22/2019. FINDINGS: Heart is mildly enlarged and the mediastinal contour stable. There is atherosclerotic calcification of the aorta. A TAVR stent, prosthetic cardiac valves and left atrial appendage clip are noted. Lung volumes are low. No consolidation, effusion, or pneumothorax. Sternotomy wires are present over the midline. No acute osseous abnormality. IMPRESSION: No active disease. Electronically Signed   By: Brett Fairy M.D.   On: 05/26/2022 20:55    Review of Systems  HENT:  Negative for ear discharge, ear pain, hearing loss and tinnitus.   Eyes:  Negative for photophobia and pain.  Respiratory:  Negative for cough and shortness of breath.    Cardiovascular:  Negative for chest pain.  Gastrointestinal:  Negative for abdominal pain, nausea and vomiting.  Genitourinary:  Negative for dysuria, flank pain, frequency and urgency.  Musculoskeletal:  Positive for arthralgias (Right hip). Negative for back pain, myalgias and neck pain.  Neurological:  Negative for dizziness and headaches.  Hematological:  Does not bruise/bleed easily.  Psychiatric/Behavioral:  The patient is not nervous/anxious.    Blood pressure (!) 117/59, pulse 66, temperature 99.1 F (37.3 C), temperature source Oral, resp. rate 16, height '5\' 7"'$  (1.702 m), weight 60.3 kg, SpO2 98 %. Physical Exam Constitutional:      General: She is not in acute distress.    Appearance: She is well-developed. She is not diaphoretic.  HENT:     Head: Normocephalic and atraumatic.  Eyes:     General: No scleral icterus.       Right eye: No discharge.        Left eye: No discharge.     Conjunctiva/sclera: Conjunctivae normal.  Cardiovascular:     Rate and Rhythm: Normal rate and regular rhythm.  Pulmonary:     Effort: Pulmonary effort is normal. No respiratory distress.  Musculoskeletal:     Cervical back: Normal range of motion.     Comments: RLE No traumatic wounds, ecchymosis, or rash  Mod TTP  No knee or ankle effusion  Knee stable to varus/ valgus and anterior/posterior stress  Sens DPN, SPN, TN intact  Motor EHL, ext, flex, evers 5/5  DP 1+, PT 1+, No significant edema  Skin:    General: Skin is warm and dry.  Neurological:     Mental Status: She is alert.  Psychiatric:        Mood and Affect: Mood normal.        Behavior: Behavior normal.     Assessment/Plan: Right hip fx -- Will need IMN when cleared by medicine/cardiology. Hopeful for this afternoon but may be tomorrow depending on clearance. 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

## 2022-05-27 NOTE — Assessment & Plan Note (Signed)
Repeat echo

## 2022-05-27 NOTE — Anesthesia Preprocedure Evaluation (Signed)
Anesthesia Evaluation  Patient identified by MRN, date of birth, ID band Patient awake    Reviewed: Allergy & Precautions, NPO status , Patient's Chart, lab work & pertinent test results  Airway Mallampati: II  TM Distance: >3 FB Neck ROM: Full    Dental  (+) Edentulous Upper, Edentulous Lower   Pulmonary neg pulmonary ROS   Pulmonary exam normal        Cardiovascular hypertension, + DVT  Normal cardiovascular exam+ dysrhythmias Atrial Fibrillation + Valvular Problems/Murmurs (moderate to severe AI) AI      Neuro/Psych  PSYCHIATRIC DISORDERS Anxiety Depression    TIACVA    GI/Hepatic Neg liver ROS,GERD  Medicated and Controlled,,  Endo/Other  diabetes, Insulin Dependent    Renal/GU Renal disease     Musculoskeletal  (+) Arthritis ,    Abdominal   Peds  Hematology  (+) Blood dyscrasia (Eliquis)   Anesthesia Other Findings Right intertrochanteric femur fracture  Reproductive/Obstetrics                             Anesthesia Physical Anesthesia Plan  ASA: 4  Anesthesia Plan: General   Post-op Pain Management:    Induction: Intravenous  PONV Risk Score and Plan: 3 and Ondansetron, Dexamethasone and Treatment may vary due to age or medical condition  Airway Management Planned: Oral ETT  Additional Equipment:   Intra-op Plan:   Post-operative Plan: Extubation in OR  Informed Consent: I have reviewed the patients History and Physical, chart, labs and discussed the procedure including the risks, benefits and alternatives for the proposed anesthesia with the patient or authorized representative who has indicated his/her understanding and acceptance.     Dental advisory given and Consent reviewed with POA  Plan Discussed with: CRNA  Anesthesia Plan Comments:        Anesthesia Quick Evaluation

## 2022-05-27 NOTE — Progress Notes (Signed)
  Echocardiogram 2D Echocardiogram has been performed.  Rachel Santos 05/27/2022, 12:11 PM

## 2022-05-27 NOTE — Anesthesia Postprocedure Evaluation (Signed)
Anesthesia Post Note  Patient: Rachel Santos  Procedure(s) Performed: AN AD HOC NERVE BLOCK     Patient location during evaluation: PACU Anesthesia Type: Regional Level of consciousness: awake Pain management: pain level controlled Vital Signs Assessment: post-procedure vital signs reviewed and stable Respiratory status: spontaneous breathing Cardiovascular status: stable Anesthetic complications: no Comments: Pre block pain score 5/10  Post block pain score 1/10  No notable events documented.  Last Vitals:  Vitals:   05/27/22 1051 05/27/22 1054  BP:    Pulse: 72   Resp:    Temp: (!) 36.3 C   SpO2:  95%    Last Pain:  Vitals:   05/27/22 1054  TempSrc:   PainSc: [name]

## 2022-05-27 NOTE — Anesthesia Procedure Notes (Addendum)
Anesthesia Regional Block: Femoral nerve block   Pre-Anesthetic Checklist: , timeout performed,  Correct Patient, Correct Site, Correct Laterality,  Correct Procedure, Correct Position, site marked,  Risks and benefits discussed,  Surgical consent,  Pre-op evaluation,  At surgeon's request and post-op pain management  Laterality: Right  Prep: chloraprep       Needles:  Injection technique: Single-shot  Needle Type: Echogenic Stimulator Needle     Needle Length: 9cm  Needle Gauge: 21     Additional Needles:   Procedures:,,,, ultrasound used (permanent image in chart),,    Narrative:  Start time: 05/27/2022 10:42 AM End time: 05/27/2022 10:46 AM Injection made incrementally with aspirations every 5 mL.  Performed by: Personally  Anesthesiologist: Effie Berkshire, MD  Additional Notes: Discussed risks and benefits of the nerve block in detail, including but not limited vascular injury, permanent nerve damage and infection.   Patient tolerated the procedure well. Local anesthetic introduced in an incremental fashion under minimal resistance after negative aspirations. No paresthesias were elicited. After completion of the procedure, no acute issues were identified and patient continued to be monitored by RN.

## 2022-05-27 NOTE — Progress Notes (Signed)
Initial Nutrition Assessment  DOCUMENTATION CODES:   Not applicable  INTERVENTION:  - Continue current diet order.   - Add MVI q day.   NUTRITION DIAGNOSIS:   Increased nutrient needs related to wound healing as evidenced by estimated needs.  GOAL:   Patient will meet greater than or equal to 90% of their needs  MONITOR:   PO intake  REASON FOR ASSESSMENT:   Consult Hip fracture protocol  ASSESSMENT:   82 y.o. female admits related to fall. PMH includes: acute CVA, afib, CAD, cancer, CKD, DM, GERD, HTN. Pt is currently receiving medical management related to closed right hip fracture.  Meds reviewed: lipitor, sliding scale insulin, levemir. Labs reviewed.   Pt reports that she has a good appetite and was eating well PTA. She denies any wt loss. Pt also declines any supplementation at this time. RD will continue to monitor PO intakes.   NUTRITION - FOCUSED PHYSICAL EXAM:  Flowsheet Row Most Recent Value  Orbital Region Mild depletion  Upper Arm Region No depletion  Thoracic and Lumbar Region Unable to assess  Buccal Region Mild depletion  Temple Region Mild depletion  Clavicle Bone Region Mild depletion  Clavicle and Acromion Bone Region Mild depletion  Scapular Bone Region Unable to assess  Dorsal Hand Mild depletion  Patellar Region Unable to assess  Anterior Thigh Region Unable to assess  Posterior Calf Region Unable to assess  Edema (RD Assessment) None  Hair Reviewed  Eyes Reviewed  Mouth Reviewed  Skin Reviewed  Nails Reviewed       Diet Order:   Diet Order             Diet NPO time specified Except for: Sips with Meds  Diet effective midnight           Diet Carb Modified Fluid consistency: Thin; Room service appropriate? Yes  Diet effective now                   EDUCATION NEEDS:   Not appropriate for education at this time  Skin:  Skin Assessment: Reviewed RN Assessment  Last BM:  PTA  Height:   Ht Readings from Last 1  Encounters:  05/26/22 '5\' 7"'$  (1.702 m)    Weight:   Wt Readings from Last 1 Encounters:  05/27/22 60.3 kg    Ideal Body Weight:     BMI:  Body mass index is 20.82 kg/m.  Estimated Nutritional Needs:   Kcal:  1500-1810 kcals  Protein:  75-90 gm  Fluid:  >/= 1.5 L  Thalia Bloodgood, RD, LDN, CNSC.

## 2022-05-27 NOTE — Consult Note (Signed)
Reason for Consult:Right hip fx Referring Physician: Eric [place] Time called: [id number] Time at bedside: [place] is an 82 y.o. female.  HPI: Rachel Santos fell at home. She's unsure what caused the fall, just ended up on the floor. She had immediate right hip pain and could not get up. She was brought to the ED where x-rays showed a right hip fx and orthopedic surgery was consulted. She lives alone with family close by and should be ambulating with a RW but often forgets.  Past Medical History:  Diagnosis Date   Acute CVA (cerebrovascular accident) ([place]) 01/24/2019   Anxiety    Arthritis    knees   Atrial fibrillation (HCC)    CAD (coronary artery disease)    a. pre TAVR cath 2020 mild nonobstructive CAD in the LAD and CX with moderate nononstructive disease in the mid-distal RCA and PDA.   Cancer ([place])    left breast   Cataracts, bilateral    CKD (chronic kidney disease), stage III (HCC)    CVA (cerebral vascular accident) ([place])    Depression    Diabetes mellitus    DVT (deep venous thrombosis) (HCC)    hx of   Dyspnea    with exertion   Full dentures    GERD (gastroesophageal reflux disease)    Groin hematoma    Hearing aid worn    B/L   History of TIA (transient ischemic attack)    mini stroke with vision problems   Hypercholesterolemia    Hypertension    Hypertensive nephropathy    MCI (mild cognitive impairment)    Memory loss    Middle cerebral artery stenosis, left    Mitral regurgitation    Paroxysmal atrial fibrillation (HCC)    Pneumonia    PONV (postoperative nausea and vomiting)    Premature atrial contraction    S/P Maze operation for atrial fibrillation 10/11/2012   Complete bilateral atrial lesion set using bipolar radiofrequency and cryothermy ablation with clipping of LA appendage   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   86m Sorin Memo 3D ring annuloplasty with 26 mm Edwards mc3 tricuspid ring annuloplasty    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   s/p TAVR with a 23 Edwards Sapien 3 Ultra THV via the TF approach   Thyroid nodule    Tricuspid regurgitation    Wears glasses     Past Surgical History:  Procedure Laterality Date   ABDOMINAL AORTOGRAM W/LOWER EXTREMITY N/A 03/14/2022   Procedure: ABDOMINAL AORTOGRAM W/LOWER EXTREMITY;  Surgeon: HCherre Robins MD;  [facility];  Service: Cardiovascular;  Laterality: N/A;   ARTERY REPAIR Right 01/22/2019   Procedure: Right Common Femoral Artery Repair;  Surgeon: DAngelia Mould MD;  [facility]  Service: Vascular;  Laterality: Right;   CARDIAC CATHETERIZATION  09-05-12   CARDIOVERSION N/A 07/31/2012   Procedure: CARDIOVERSION;  Surgeon: PJosue Hector MD;  [facility]  Service: Cardiovascular;  Laterality: N/A;   CATARACT EXTRACTION W/ INTRAOCULAR LENS  IMPLANT, BILATERAL     CESAREAN SECTION     x 2   COLONOSCOPY WITH PROPOFOL N/A 11/28/2014   Procedure: COLONOSCOPY WITH PROPOFOL;  Surgeon: PCarol Ada MD;  [facility];  Service: Endoscopy;  Laterality: N/A;   COLONOSCOPY WITH PROPOFOL N/A 01/22/2021   Procedure: COLONOSCOPY WITH PROPOFOL;  Surgeon: HCarol Ada MD;  [facility];  Service: Endoscopy;  Laterality: N/A;   DILATION AND CURETTAGE OF  UTERUS     FEMORAL ARTERY EXPLORATION Right 01/22/2019   Procedure: RIGHT GROIN EXPLORATION with evacuation of hematoma;  Surgeon: Angelia Mould, MD;  Location: [facility];  Service: Vascular;  Laterality: Right;   FLEXIBLE SIGMOIDOSCOPY N/A 12/18/2015   Procedure: FLEXIBLE SIGMOIDOSCOPY;  Surgeon: Carol Ada, MD;  [facility];  Service: Endoscopy;  Laterality: N/A;   INTRAOPERATIVE TRANSESOPHAGEAL ECHOCARDIOGRAM N/A 10/11/2012   Procedure: INTRAOPERATIVE TRANSESOPHAGEAL ECHOCARDIOGRAM;  Surgeon: Rexene Alberts, MD;  Location: [facility];  Service: Open Heart Surgery;  Laterality: N/A;   IR ANGIO INTRA EXTRACRAN SEL COM CAROTID  INNOMINATE BILAT MOD SED  05/21/2019   IR ANGIO VERTEBRAL SEL VERTEBRAL BILAT MOD SED  05/21/2019   MASTECTOMY Left 1982   MAZE N/A 10/11/2012   Procedure: MAZE;  Surgeon: Rexene Alberts, MD;  Location: [facility];  Service: Open Heart Surgery;  Laterality: N/A;   MITRAL VALVE REPAIR N/A 10/11/2012   Procedure: MITRAL VALVE REPAIR (MVR);  Surgeon: Rexene Alberts, MD;  Location: [facility];  Service: Open Heart Surgery;  Laterality: N/A;   MULTIPLE TOOTH EXTRACTIONS     PERIPHERAL VASCULAR BALLOON ANGIOPLASTY Right 03/14/2022   Procedure: PERIPHERAL VASCULAR BALLOON ANGIOPLASTY;  Surgeon: Cherre Robins, MD;  Location: [facility];  Service: Cardiovascular;  Laterality: Right;  Peroneal   PERIPHERAL VASCULAR INTERVENTION Right 03/14/2022   Procedure: PERIPHERAL VASCULAR INTERVENTION;  Surgeon: Cherre Robins, MD;  Location: [facility];  Service: Cardiovascular;  Laterality: Right;  SFA   RADIOLOGY WITH ANESTHESIA N/A 06/19/2019   Procedure: STENTING;  Surgeon: Luanne Bras, MD;  Location: [facility];  Service: Radiology;  Laterality: N/A;   RIGHT HEART CATH AND CORONARY ANGIOGRAPHY N/A 12/24/2018   Procedure: RIGHT HEART CATH AND CORONARY ANGIOGRAPHY;  Surgeon: [name], MD;  Location: [facility];  Service: Cardiovascular;  Laterality: N/A;   TEE WITHOUT CARDIOVERSION N/A 07/31/2012   Procedure: TRANSESOPHAGEAL ECHOCARDIOGRAM (TEE);  Surgeon: Josue Hector, MD;  Location: [facility];  Service: Cardiovascular;  Laterality: N/A;   TEE WITHOUT CARDIOVERSION N/A 01/22/2019   Procedure: TRANSESOPHAGEAL ECHOCARDIOGRAM (TEE);  Surgeon: [name], MD;  Location: [facility];  Service: Open Heart Surgery;  Laterality: N/A;   TRANSCATHETER AORTIC VALVE REPLACEMENT, TRANSFEMORAL N/A 01/22/2019   Procedure: TRANSCATHETER AORTIC VALVE REPLACEMENT, TRANSFEMORAL;  Surgeon: [name], MD;  Location: [facility];  Service: Open Heart Surgery;   Laterality: N/A;   TRICUSPID VALVE REPLACEMENT N/A 10/11/2012   Procedure: TRICUSPID VALVE REPAIR;  Surgeon: Rexene Alberts, MD;  Location: [facility];  Service: Open Heart Surgery;  Laterality: N/A;   TUBAL LIGATION      Family History  Problem Relation Age of Onset   Alcohol abuse Mother    Cancer Father        prostate   Hypertension Father    Heart attack Paternal Grandfather    Stroke Neg Hx     Social History:  reports that she has an unknown smoking status. She has never been exposed to tobacco smoke. She has never used smokeless tobacco. She reports that she does not drink alcohol and does not use drugs.  Allergies:  Allergies  Allergen Reactions   Buspirone Hcl     lethargic/tired   Metformin And Related Diarrhea   Ozempic (0.25 Or 0.5 Mg-Dose) [Semaglutide(0.25 Or 0.'5mg'$ -Dos)]     Other reaction(s): nausea   Ultram [Tramadol] Nausea And Vomiting   Nickel Rash  Pt unable to wear jewelry made of nickel.   Pneumococcal Vaccines Swelling and Rash    At injection set    Medications: I have reviewed the patient's current medications.  Results for orders placed or performed during the hospital encounter of 05/26/22 (from the past 48 hour(s))  Comprehensive metabolic panel     Status: Abnormal   Collection Time: 05/26/22  8:25 PM  Result Value Ref Range   Sodium 139 135 - 145 mmol/L   Potassium 4.0 3.5 - 5.1 mmol/L   Chloride 102 98 - 111 mmol/L   CO2 24 22 - 32 mmol/L   Glucose, Bld 152 (H) 70 - 99 mg/dL    Comment: Glucose reference range applies only to samples taken after fasting for at least 8 hours.   BUN 26 (H) 8 - 23 mg/dL   Creatinine, Ser 0.78 0.44 - 1.00 mg/dL   Calcium 9.5 8.9 - 10.3 mg/dL   Total Protein 6.7 6.5 - 8.1 g/dL   Albumin 3.7 3.5 - 5.0 g/dL   AST 28 15 - 41 U/L   ALT 30 0 - 44 U/L   Alkaline Phosphatase 79 38 - 126 U/L   Total Bilirubin 0.5 0.3 - 1.2 mg/dL   GFR, Estimated >60 >60 mL/min    Comment: (NOTE) Calculated using the CKD-EPI  Creatinine Equation (2021)    Anion gap 13 5 - 15    Comment: Performed at [facility] [address], [place] [address]  CBC     Status: None   Collection Time: 05/26/22  8:25 PM  Result Value Ref Range   WBC 10.2 4.0 - 10.5 K/uL   RBC 4.84 3.87 - 5.11 MIL/uL   Hemoglobin 14.5 12.0 - 15.0 g/dL   HCT 43.1 36.0 - 46.0 %   MCV 89.0 80.0 - 100.0 fL   MCH 30.0 26.0 - 34.0 pg   MCHC 33.6 30.0 - 36.0 g/dL   RDW 13.5 11.5 - 15.5 %   Platelets 198 150 - 400 K/uL   nRBC 0.0 0.0 - 0.2 %    Comment: Performed at [facility], [place] [address], [place], [place] [address]  Ethanol     Status: None   Collection Time: 05/26/22  8:25 PM  Result Value Ref Range   Alcohol, Ethyl (B) <10 <10 mg/dL    Comment: (NOTE) Lowest detectable limit for serum alcohol is 10 mg/dL.  For medical purposes only. Performed at [facility], [place] [address] [place], [place] [address]   Lactic acid, plasma     Status: Abnormal   Collection Time: 05/26/22  8:25 PM  Result Value Ref Range   Lactic Acid, Venous 2.1 (HH) 0.5 - 1.9 mmol/L    Comment: CRITICAL RESULT CALLED TO, READ BACK BY AND VERIFIED WITH GRACE TATE RN 05/26/22 2[address] Ke Performed at [facility], [place] [address] [place], [place] [address]   Protime-INR     Status: None   Collection Time: 05/26/22  8:25 PM  Result Value Ref Range   Prothrombin Time 13.9 11.4 - 15.2 seconds   INR 1.1 0.8 - 1.2    Comment: (NOTE) INR goal varies based on device and disease states. Performed at [facility], [place] [address] [place], [place] [address]   Sample to Blood Bank     Status: None   Collection Time: 05/26/22  8:25 PM  Result Value Ref Range   Blood Bank Specimen  SAMPLE AVAILABLE FOR TESTING    Sample Expiration      05/29/2022,2359 Performed at [facility], [place] [address], [place], [place] [address]   CK     Status: None   Collection Time: 05/26/22  8:25 PM  Result Value Ref Range   Total CK  43 38 - 234 U/L    Comment: Performed at [facility], [place] [address] [place], [place] [address]  Troponin I (High Sensitivity)     Status: None   Collection Time: 05/26/22  8:25 PM  Result Value Ref Range   Troponin I (High Sensitivity) 10 <18 ng/L    Comment: (NOTE) Elevated high sensitivity troponin I (hsTnI) values and significant  changes across serial measurements may suggest ACS but many other  chronic and acute conditions are known to elevate hsTnI results.  Refer to the "Links" section for chest pain algorithms and additional  guidance. Performed at [facility], [place] [address] [place], [place] [address]   Type and screen     Status: None   Collection Time: 05/26/22  8:25 PM  Result Value Ref Range   ABO/RH(D) A NEG    Antibody Screen NEG    Sample Expiration      05/29/2022,2359 Performed at [facility], [place] [address] [place], [place] [address]   I-Stat Chem 8, ED     Status: Abnormal   Collection Time: 05/26/22  8:37 PM  Result Value Ref Range   Sodium 140 135 - 145 mmol/L   Potassium 4.1 3.5 - 5.1 mmol/L   Chloride 105 98 - 111 mmol/L   BUN 26 (H) 8 - 23 mg/dL   Creatinine, Ser 0.70 0.44 - 1.00 mg/dL   Glucose, Bld 147 (H) 70 - 99 mg/dL    Comment: Glucose reference range applies only to samples taken after fasting for at least 8 hours.   Calcium, Ion 1.08 (L) 1.15 - 1.40 mmol/L   TCO2 25 22 - 32 mmol/L   Hemoglobin 14.3 12.0 - 15.0 g/dL   HCT 42.0 36.0 - 46.0 %  Troponin I (High Sensitivity)     Status: None   Collection Time: 05/26/22 10:33 PM  Result Value Ref Range   Troponin I (High Sensitivity) 10 <18 ng/L    Comment: (NOTE) Elevated high sensitivity troponin I (hsTnI) values and significant  changes across serial measurements may suggest ACS but many other  chronic and acute conditions are known to elevate hsTnI results.  Refer to the "Links" section for chest pain algorithms and additional  guidance. Performed at [facility], [place] [address], [place], [place] [address]   Magnesium     Status: None   Collection Time: 05/26/22 10:33 PM  Result Value Ref Range   Magnesium 1.9 1.7 - 2.4 mg/dL    Comment: Performed at [facility] [address] [place], [place] [address]  Phosphorus     Status: None   Collection Time: 05/26/22 10:33 PM  Result Value Ref Range   Phosphorus 3.4 2.5 - 4.6 mg/dL    Comment: Performed at [facility], [place] [address], [place], [place] [address]  Lactic acid, plasma     Status: None   Collection Time: 05/26/22 11:20 PM  Result Value Ref Range   Lactic Acid, Venous 1.5 0.5 - 1.9 mmol/L    Comment: Performed at [facility] [address], [place],  [address]  TSH  Status: None   Collection Time: 05/26/22 11:20 PM  Result Value Ref Range   TSH 2.375 0.350 - 4.500 uIU/mL    Comment: Performed by a 3rd Generation assay with a functional sensitivity of <=0.01 uIU/mL. Performed at [facility], [place] [address], [place], [place] [address]   CBG monitoring, ED     Status: Abnormal   Collection Time: 05/26/22 11:21 PM  Result Value Ref Range   Glucose-Capillary 127 (H) 70 - 99 mg/dL    Comment: Glucose reference range applies only to samples taken after fasting for at least 8 hours.  Lactic acid, plasma     Status: None   Collection Time: 05/27/22  1:15 AM  Result Value Ref Range   Lactic Acid, Venous 0.9 0.5 - 1.9 mmol/L    Comment: Performed at [facility] [address] [place], [place] [address]  CBG monitoring, ED     Status: Abnormal   Collection Time: 05/27/22  1:15 AM  Result Value Ref Range   Glucose-Capillary 149 (H) 70 - 99 mg/dL    Comment: Glucose reference range applies only to samples taken after fasting for at least 8 hours.  Glucose, capillary     Status: Abnormal   Collection Time: 05/27/22  4:23 AM  Result Value Ref Range   Glucose-Capillary 200 (H) 70 - 99 mg/dL    Comment: Glucose reference range applies  only to samples taken after fasting for at least 8 hours.  Prealbumin     Status: None   Collection Time: 05/27/22  6:01 AM  Result Value Ref Range   Prealbumin 22 18 - 38 mg/dL    Comment: Performed at [facility] [address], [place], [place] [address]  Glucose, capillary     Status: Abnormal   Collection Time: 05/27/22  8:55 AM  Result Value Ref Range   Glucose-Capillary 118 (H) 70 - 99 mg/dL    Comment: Glucose reference range applies only to samples taken after fasting for at least 8 hours.    CT CERVICAL SPINE WO CONTRAST  Result Date: 05/26/2022 CLINICAL DATA:  Fall.  Polytrauma, blunt EXAM: CT CERVICAL SPINE WITHOUT CONTRAST TECHNIQUE: Multidetector CT imaging of the cervical spine was performed without intravenous contrast. Multiplanar CT image reconstructions were also generated. RADIATION DOSE REDUCTION: This exam was performed according to the departmental dose-optimization program which includes automated exposure control, adjustment of the mA and/or kV according to patient size and/or use of iterative reconstruction technique. COMPARISON:  CTA head neck 01/13/2021 FINDINGS: Alignment: Normal. Skull base and vertebrae: Craniocervical alignment is normal. The atlantodental interval is not widened. Degenerative changes are seen surrounding the odontoid process without significant associated canal stenosis. No acute fracture of the cervical spine. Vertebral body height is preserved. Soft tissues and spinal canal: No prevertebral fluid or swelling. No visible canal hematoma. Disc levels: There is diffuse intervertebral disc space narrowing, endplate remodeling, disc calcification throughout the cervical spine in keeping with changes of advanced degenerative disc disease. The spinal canal is widely patent. Multilevel uncovertebral and facet arthrosis results in multilevel moderate to severe neuroforaminal narrowing, most severe on the right at C5-6. Upper chest: Negative. Other: 18  mm left thyroid nodule, stable since prior examination though mildly enlarged since remote prior examination of 09/27/2012. IMPRESSION: 1. No acute fracture or listhesis of the cervical spine. 2. Advanced multilevel degenerative disc and degenerative joint disease resulting in multilevel moderate to severe neuroforaminal narrowing, most severe on the right  at C5-6. 3. Stable left thyroid nodule. Recommend thyroid US (ref: J Am Coll Radiol. 2015 Feb;12(2): 143-50). Electronically Signed   By: Fidela Salisbury M.D.   On: 05/26/2022 21:24   CT HEAD WO CONTRAST  Result Date: 05/26/2022 CLINICAL DATA:  Trauma EXAM: CT HEAD WITHOUT CONTRAST TECHNIQUE: Contiguous axial images were obtained from the base of the skull through the vertex without intravenous contrast. RADIATION DOSE REDUCTION: This exam was performed according to the departmental dose-optimization program which includes automated exposure control, adjustment of the mA and/or kV according to patient size and/or use of iterative reconstruction technique. COMPARISON:  Head CT 05/23/2022 FINDINGS: Brain: No evidence of acute infarction, hemorrhage, hydrocephalus, extra-axial collection or mass lesion/mass effect. Again seen is mild diffuse atrophy. Vascular: Atherosclerotic calcifications are present within the cavernous internal carotid arteries. Skull: Normal. Negative for fracture or focal lesion. Sinuses/Orbits: No acute finding. Other: None. IMPRESSION: 1. No acute intracranial process. 2. Mild diffuse atrophy. Electronically Signed   By: Ronney Asters M.D.   On: 05/26/2022 21:16   DG Knee Right Port  Result Date: 05/26/2022 CLINICAL DATA:  Blunt trauma, fall. EXAM: PORTABLE RIGHT KNEE - 1-2 VIEW COMPARISON:  None Available. FINDINGS: No acute fracture or dislocation. Severe tricompartmental degenerative changes are noted at the knee. There is a small suprapatellar joint effusion containing coarse calcifications, likely loose bodies. Vascular  calcifications and vascular stent are noted in the soft tissues. IMPRESSION: 1. No acute fracture or dislocation. 2. Severe tricompartmental degenerative changes. 3. Small suprapatellar joint effusion with loose bodies. Electronically Signed   By: Brett Fairy M.D.   On: 05/26/2022 21:00   DG Hip Unilat W or Wo Pelvis 2-3 Views Right  Result Date: 05/26/2022 CLINICAL DATA:  Trauma, fall. EXAM: DG HIP (WITH OR WITHOUT PELVIS) 2-3V RIGHT COMPARISON:  12/19/2021. FINDINGS: There is a mildly displaced and comminuted intertrochanteric fracture of the proximal right femur. There are old fractures of the superior pubic ramus on the right and inferior pubic ramus on the left. No dislocation at the hips bilaterally. Mild degenerative changes are noted at the hips bilaterally and lower lumbar spine. Vascular calcifications and stent are noted in the right lower extremity. IMPRESSION: 1. Mildly displaced and comminuted intertrochanteric fracture of the right hip. 2. Old fractures of the superior pubic ramus on the right and inferior pubic ramus on the left. Electronically Signed   By: Brett Fairy M.D.   On: 05/26/2022 20:58   DG Chest Port 1 View  Result Date: 05/26/2022 CLINICAL DATA:  Level 2 trauma, fall. EXAM: PORTABLE CHEST 1 VIEW COMPARISON:  01/22/2019. FINDINGS: Heart is mildly enlarged and the mediastinal contour stable. There is atherosclerotic calcification of the aorta. A TAVR stent, prosthetic cardiac valves and left atrial appendage clip are noted. Lung volumes are low. No consolidation, effusion, or pneumothorax. Sternotomy wires are present over the midline. No acute osseous abnormality. IMPRESSION: No active disease. Electronically Signed   By: Brett Fairy M.D.   On: 05/26/2022 20:55    Review of Systems  HENT:  Negative for ear discharge, ear pain, hearing loss and tinnitus.   Eyes:  Negative for photophobia and pain.  Respiratory:  Negative for cough and shortness of breath.    Cardiovascular:  Negative for chest pain.  Gastrointestinal:  Negative for abdominal pain, nausea and vomiting.  Genitourinary:  Negative for dysuria, flank pain, frequency and urgency.  Musculoskeletal:  Positive for arthralgias (Right hip). Negative for back pain, myalgias and neck pain.  Neurological:  Negative for dizziness and headaches.  Hematological:  Does not bruise/bleed easily.  Psychiatric/Behavioral:  The patient is not nervous/anxious.    Blood pressure (!) 117/59, pulse 66, temperature 99.1 F (37.3 C), temperature source Oral, resp. rate 16, height '5\' 7"'$  (1.702 m), weight 60.3 kg, SpO2 98 %. Physical Exam Constitutional:      General: She is not in acute distress.    Appearance: She is well-developed. She is not diaphoretic.  HENT:     Head: Normocephalic and atraumatic.  Eyes:     General: No scleral icterus.       Right eye: No discharge.        Left eye: No discharge.     Conjunctiva/sclera: Conjunctivae normal.  Cardiovascular:     Rate and Rhythm: Normal rate and regular rhythm.  Pulmonary:     Effort: Pulmonary effort is normal. No respiratory distress.  Musculoskeletal:     Cervical back: Normal range of motion.     Comments: RLE No traumatic wounds, ecchymosis, or rash  Mod TTP  No knee or ankle effusion  Knee stable to varus/ valgus and anterior/posterior stress  Sens DPN, SPN, TN intact  Motor EHL, ext, flex, evers 5/5  DP 1+, PT 1+, No significant edema  Skin:    General: Skin is warm and dry.  Neurological:     Mental Status: She is alert.  Psychiatric:        Mood and Affect: Mood normal.        Behavior: Behavior normal.     Assessment/Plan: Right hip fx -- Will need IMN when cleared by medicine/cardiology. Hopeful for this afternoon but may be tomorrow depending on clearance. 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

## 2022-05-27 NOTE — Anesthesia Preprocedure Evaluation (Addendum)
Anesthesia Evaluation  Patient identified by MRN, date of birth, ID band Patient awake    Reviewed: Allergy & Precautions, NPO status , Patient's Chart, lab work & pertinent test results  Airway Mallampati: I  TM Distance: >3 FB Neck ROM: Full    Dental  (+) Edentulous Upper, Edentulous Lower   Pulmonary    breath sounds clear to auscultation       Cardiovascular hypertension, + CAD  + dysrhythmias Atrial Fibrillation + Valvular Problems/Murmurs MR  Rhythm:Regular Rate:Normal  - s/p TAVR   Neuro/Psych  PSYCHIATRIC DISORDERS Anxiety Depression    CVA    GI/Hepatic Neg liver ROS,GERD  Medicated,,  Endo/Other  diabetes, Type 2, Insulin Dependent    Renal/GU Renal disease     Musculoskeletal  (+) Arthritis ,    Abdominal   Peds  Hematology   Anesthesia Other Findings   Reproductive/Obstetrics                             Anesthesia Physical Anesthesia Plan  ASA: 3  Anesthesia Plan: Regional   Post-op Pain Management: Minimal or no pain anticipated   Induction:   PONV Risk Score and Plan: 0  Airway Management Planned: Nasal Cannula  Additional Equipment: None  Intra-op Plan:   Post-operative Plan:   Informed Consent: I have reviewed the patients History and Physical, chart, labs and discussed the procedure including the risks, benefits and alternatives for the proposed anesthesia with the patient or authorized representative who has indicated his/her understanding and acceptance.     Consent reviewed with POA  Plan Discussed with:   Anesthesia Plan Comments:        Anesthesia Quick Evaluation

## 2022-05-27 NOTE — Assessment & Plan Note (Signed)
-   management as per orthopedics,  plan to operate   in  a.m.   Keep nothing by mouth post midnight. Patient  on anticoagulation or antiplatelet agents    on hold Ordered type and screen, order a vitamin D level  Patient at baseline  unable to walk a flight of stairs or 100 feet she has not been out of her apartment for 2 years  Patient denies any chest pain or shortness of breath currently and/or with exertion,   ECG showing no evidence of acute ischemia  known history of coronary artery disease, CKD  Given advanced age patient is at least moderate  risk  which has been discussed with family     will order echo CE wnl   Cardiology consult for further pre-op clearance given extensive cardiac disease

## 2022-05-27 NOTE — Assessment & Plan Note (Signed)
Rehydrate and recheck 

## 2022-05-27 NOTE — Consult Note (Addendum)
Cardiology Consultation   Patient ID: JENEL WICE MRN: [id number]; DOB: 1940/10/18  Admit date: 05/26/2022 Date of Consult: 05/27/2022  PCP:  Charlane Ferretti, MD   [facility] Cardiologist:  Lauree Chandler, MD        Patient Profile:   Rachel Santos is a 82 y.o. female with a hx of HTN, HLD, DM II, nonobstructive CAD, severe mitral and tricuspid regurgitation both treated with annuloplasty surgery on 10/11/2012, severe AS s/p TAVR 01/22/2019, PAF on Eliquis, CVA and PAD s/p lower extremity angiography/stenting who is being seen 05/27/2022 for the evaluation of preoperative clearance prior to R hip surgery at the request of Dr. [place]  History of Present Illness:   Rachel Santos is a 82 year old female with past medical history of HTN, HLD, DM II, nonobstructive CAD, severe mitral and tricuspid regurgitation both treated with annuloplasty surgery on 10/11/2012, severe AS s/p TAVR 01/22/2019, PAF on Eliquis, CVA and PAD s/p lower extremity angiography/stenting.  Patient was established with cardiology service in April 2014 for evaluation of A-fib and a severe MR.  She underwent TEE DCCV in May 2014 with restoration of sinus rhythm.  Echo at the time showed central and severe MR and moderate to severe TR.  Cardiac catheterization performed on 09/05/2012 demonstrated mild CAD.  She underwent annuloplasty of mitral valve and tricuspid valve as well as a Maze procedure by Dr. Roxy Manns on 10/11/2012.  Echocardiogram in August 2020 showed EF 65%, moderate LVH, stable mitral and tricuspid repair, severe aortic stenosis was noted.  She subsequently underwent a repeat cardiac catheterization in September 2020 that showed mild to moderate nonobstructive CAD with 50% mid RCA, 50% RPDA, 30% proximal RCA, 20% mid left circumflex, 40% ostial to proximal LAD and 30% mid LAD lesion.  She underwent successful TAVR with 23 mm Edwards SAPIEN 3 THV via TF approach on 01/22/2019.  Postop course  was complicated by right groin hematoma requiring surgical repair, blood transfusion as well as cardioembolic CVA.  MRA showed multifocal acute ischemia scattered within both hemispheres, left greater than right, and also in the left cerebellum most consistent with central embolic source.  She was started on Xarelto.  She was readmitted in November 2020 and [facility] with groin pain and recurrent aphasia.  Hemoglobin went down to 7.4 and required transfusion.  CT of abdomen pelvis revealed right inguinal hematoma and a small right pelvic hematoma.  CT of the head showed no acute abnormality however MRI revealed several small acute cortical-based infarct in the left frontal, parietal and occipital lobes which may represent embolic source.  CTA of the head and neck showed severe narrowing of left MCA and moderate to severe narrowing of the left PCA.  Neurology felt her stroke was most likely related to her intracranial atherosclerotic disease.  Baby aspirin was added to her Xarelto.  Heart monitor in March 2021 showed sinus rhythm with PACs.  She underwent treatment of left MCA stenosis and was placed on Brilinta, aspirin stopped.  Echocardiogram in November 2021 showed normal EF, normal functioning TAVR with trivial paravalvular leakage.  Patient was last seen by Dr. Angelena Form in February 2023 at which time she was doing well.  Unfortunately, she was admitted with lower GI bleed in July 2023.  Xarelto and Plavix were both discontinued.  She was placed on low-dose Eliquis due to lower GI bleed profile.  She was readmitted in September 2023 after a fall that resulted in right humerus fracture and right superior pubic rami  fracture.  She was managed conservatively.  More recently, she was seen by vascular surgery and underwent a lower extremity angiography by Dr. Stanford Breed on 03/14/2022, she required stenting of right femoral-popliteal artery, angioplasty of the right tibioperoneal trunk, angioplasty of the right peroneal  artery.  Postprocedure, she was placed on combination of Plavix and Eliquis.  She was seen at [facility], ED on 05/23/2022 with aphasia, MRI of the brain was negative for acute finding.  She presented to [facility] yesterday after being found down at home.  She fell at home and her life alert went off.  EMS arrived and had to forcefully enter her house as she lives alone.  Imaging revealed a right hip fracture.  Hemoglobin was normal.  Patient has been seen by orthopedic surgery service today and is planning for right hip surgery tomorrow.  Cardiology service consulted for preoperative clearance.  Talking with the patient, she denies any recent chest pain.  She does have balance issue and uses a rolling walker at home.  Unfortunately according to her daughter, even though she has a rolling walker, she often tries to walk without it even with her balance issue.  This is likely contributing to her fall.  Echocardiogram obtained today revealed EF 65 to 70%, moderate LVH, mitral annuloplasty with mild mitral stenosis, tricuspid annuloplasty present, TAVR valve present with central and possibly severe paravalvular regurgitation.  The degree of paravalvular leakage has increased when compared to the previous echocardiogram.   Past Medical History:  Diagnosis Date   Acute CVA (cerebrovascular accident) ([place]) 01/24/2019   Anxiety    Arthritis    knees   Atrial fibrillation (HCC)    CAD (coronary artery disease)    a. pre TAVR cath 2020 mild nonobstructive CAD in the LAD and CX with moderate nononstructive disease in the mid-distal RCA and PDA.   Cancer ([place])    left breast   Cataracts, bilateral    CKD (chronic kidney disease), stage III (HCC)    CVA (cerebral vascular accident) ([place])    Depression    Diabetes mellitus    DVT (deep venous thrombosis) (HCC)    hx of   Dyspnea    with exertion   Full dentures    GERD (gastroesophageal reflux disease)    Groin hematoma    Hearing aid worn     B/L   History of TIA (transient ischemic attack)    mini stroke with vision problems   Hypercholesterolemia    Hypertension    Hypertensive nephropathy    MCI (mild cognitive impairment)    Memory loss    Middle cerebral artery stenosis, left    Mitral regurgitation    Paroxysmal atrial fibrillation (HCC)    Pneumonia    PONV (postoperative nausea and vomiting)    Premature atrial contraction    S/P Maze operation for atrial fibrillation 10/11/2012   Complete bilateral atrial lesion set using bipolar radiofrequency and cryothermy ablation with clipping of LA appendage   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   97m Sorin Memo 3D ring annuloplasty with 26 mm Edwards mc3 tricuspid ring annuloplasty    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   s/p TAVR with a 23 Edwards Sapien 3 Ultra THV via the TF approach   Thyroid nodule    Tricuspid regurgitation    Wears glasses     Past Surgical History:  Procedure Laterality Date   ABDOMINAL AORTOGRAM W/LOWER EXTREMITY N/A 03/14/2022   Procedure: ABDOMINAL AORTOGRAM  W/LOWER EXTREMITY;  Surgeon: Cherre Robins, MD;  Location: [place] [facility];  Service: Cardiovascular;  Laterality: N/A;   ARTERY REPAIR Right 01/22/2019   Procedure: Right Common Femoral Artery Repair;  Surgeon: Angelia Mould, MD;  Location: [place];  Service: Vascular;  Laterality: Right;   CARDIAC CATHETERIZATION  09-05-12   CARDIOVERSION N/A 07/31/2012   Procedure: CARDIOVERSION;  Surgeon: Josue Hector, MD;  Location: [place];  Service: Cardiovascular;  Laterality: N/A;   CATARACT EXTRACTION W/ INTRAOCULAR LENS  IMPLANT, BILATERAL     CESAREAN SECTION     x 2   COLONOSCOPY WITH PROPOFOL N/A 11/28/2014   Procedure: COLONOSCOPY WITH PROPOFOL;  Surgeon: Carol Ada, MD;  Location: [facility];  Service: Endoscopy;  Laterality: N/A;   COLONOSCOPY WITH PROPOFOL N/A 01/22/2021   Procedure: COLONOSCOPY WITH PROPOFOL;  Surgeon: Carol Ada,  MD;  Location: [facility];  Service: Endoscopy;  Laterality: N/A;   DILATION AND CURETTAGE OF UTERUS     FEMORAL ARTERY EXPLORATION Right 01/22/2019   Procedure: RIGHT GROIN EXPLORATION with evacuation of hematoma;  Surgeon: Angelia Mould, MD;  Location: [place];  Service: Vascular;  Laterality: Right;   FLEXIBLE SIGMOIDOSCOPY N/A 12/18/2015   Procedure: FLEXIBLE SIGMOIDOSCOPY;  Surgeon: Carol Ada, MD;  [facility]: [facility];  Service: Endoscopy;  Laterality: N/A;   INTRAOPERATIVE TRANSESOPHAGEAL ECHOCARDIOGRAM N/A 10/11/2012   Procedure: INTRAOPERATIVE TRANSESOPHAGEAL ECHOCARDIOGRAM;  Surgeon: Rexene Alberts, MD;  Location: [place];  Service: Open Heart Surgery;  Laterality: N/A;   IR ANGIO INTRA EXTRACRAN SEL COM CAROTID INNOMINATE BILAT MOD SED  05/21/2019   IR ANGIO VERTEBRAL SEL VERTEBRAL BILAT MOD SED  05/21/2019   MASTECTOMY Left 1982   MAZE N/A 10/11/2012   Procedure: MAZE;  Surgeon: Rexene Alberts, MD;  Location: [place];  Service: Open Heart Surgery;  Laterality: N/A;   MITRAL VALVE REPAIR N/A 10/11/2012   Procedure: MITRAL VALVE REPAIR (MVR);  Surgeon: Rexene Alberts, MD;  Location: [place];  Service: Open Heart Surgery;  Laterality: N/A;   MULTIPLE TOOTH EXTRACTIONS     PERIPHERAL VASCULAR BALLOON ANGIOPLASTY Right 03/14/2022   Procedure: PERIPHERAL VASCULAR BALLOON ANGIOPLASTY;  Surgeon: Cherre Robins, MD;  Location: [place] [facility];  Service: Cardiovascular;  Laterality: Right;  Peroneal   PERIPHERAL VASCULAR INTERVENTION Right 03/14/2022   Procedure: PERIPHERAL VASCULAR INTERVENTION;  Surgeon: Cherre Robins, MD;  Location: [place] [facility];  Service: Cardiovascular;  Laterality: Right;  SFA   RADIOLOGY WITH ANESTHESIA N/A 06/19/2019   Procedure: STENTING;  Surgeon: Luanne Bras, MD;  Location: [place];  Service: Radiology;  Laterality: N/A;   RIGHT HEART CATH AND CORONARY ANGIOGRAPHY N/A 12/24/2018   Procedure: RIGHT HEART CATH AND CORONARY ANGIOGRAPHY;   Surgeon: [name], MD;  Location: [place] [facility];  Service: Cardiovascular;  Laterality: N/A;   TEE WITHOUT CARDIOVERSION N/A 07/31/2012   Procedure: TRANSESOPHAGEAL ECHOCARDIOGRAM (TEE);  Surgeon: Josue Hector, MD;  Location: [place];  Service: Cardiovascular;  Laterality: N/A;   TEE WITHOUT CARDIOVERSION N/A 01/22/2019   Procedure: TRANSESOPHAGEAL ECHOCARDIOGRAM (TEE);  Surgeon: [name], MD;  Location: [place] [facility];  Service: Open Heart Surgery;  Laterality: N/A;   TRANSCATHETER AORTIC VALVE REPLACEMENT, TRANSFEMORAL N/A 01/22/2019   Procedure: TRANSCATHETER AORTIC VALVE REPLACEMENT, TRANSFEMORAL;  Surgeon: [name], MD;  Location: [place] [facility];  Service: Open Heart Surgery;  Laterality: N/A;   TRICUSPID VALVE REPLACEMENT N/A 10/11/2012   Procedure: TRICUSPID VALVE  REPAIR;  Surgeon: Rexene Alberts, MD;  Location: [place];  Service: Open Heart Surgery;  Laterality: N/A;   TUBAL LIGATION       Home Medications:  Prior to Admission medications   Medication Sig Start Date End Date Taking? Authorizing Provider  acetaminophen (TYLENOL) 500 MG tablet Take 1,000 mg by mouth every 6 (six) hours as needed for moderate pain or headache.    [provider]  apixaban (ELIQUIS) 2.5 MG TABS tablet Take 1 tablet (2.5 mg total) by mouth 2 (two) times daily. 02/28/22   [name], MD  atorvastatin (LIPITOR) 80 MG tablet Take 1 tablet by mouth once daily 10/06/21   [name], MD  Calcium Carb-Cholecalciferol (CALCIUM 600 + D PO) Take 1 tablet by mouth 2 (two) times daily.    [provider]  clopidogrel (PLAVIX) 75 MG tablet Take 1 tablet (75 mg total) by mouth daily. 03/14/22 03/14/23  Cherre Robins, MD  dapagliflozin propanediol (FARXIGA) 10 MG TABS tablet Take 10 mg by mouth daily. 03/03/20   [provider]  docusate sodium (COLACE) 100 MG capsule Take 100-200 mg by mouth daily as needed for  mild constipation.    [provider]  insulin detemir (LEVEMIR) 100 UNIT/ML injection Inject 0.28 mLs (28 Units total) into the skin 2 (two) times daily. 12/24/21   [place] Eric J, DO  lidocaine (LIDODERM) 5 % Place 1 patch onto the skin daily. Remove & Discard patch within 12 hours or as directed by MD 12/24/21   [place] Donnamarie Poag, DO  Multiple Vitamins-Minerals (MULTIVITAMIN WITH MINERALS) tablet Take 1 tablet by mouth daily.    [provider]  nitroGLYCERIN (NITROSTAT) 0.4 MG SL tablet Place 1 tablet (0.4 mg total) under the tongue every 5 (five) minutes as needed for chest pain. 09/08/20 10/17/22  Richardson Dopp T, PA-C  omeprazole (PRILOSEC) 40 MG capsule Take 40 mg by mouth daily before breakfast.  06/28/12   [provider]  ondansetron (ZOFRAN) 4 MG tablet Take 4 mg by mouth every 8 (eight) hours as needed for nausea or vomiting. 12/14/21   [provider]  polyethylene glycol powder (MIRALAX) 17 GM/SCOOP powder Take 17 g by mouth 2 (two) times daily as needed for moderate constipation or mild constipation. 10/20/21   Mercy Riding, MD  venlafaxine XR (EFFEXOR-XR) 150 MG 24 hr capsule Take 150 mg by mouth daily with breakfast. Take with 75 mg capsule to equal 225 mg daily    [provider]  venlafaxine XR (EFFEXOR-XR) 75 MG 24 hr capsule Take 75 mg by mouth daily with breakfast. Take with 150 mg capsule to equal 225 mg daily    [provider]  vitamin B-12 (CYANOCOBALAMIN) 500 MCG tablet Take 500 mcg by mouth every Monday.    [provider]    Inpatient Medications: Scheduled Meds:  atorvastatin  80 mg Oral Daily   insulin aspart  0-9 Units Subcutaneous Q4H   insulin detemir  10 Units Subcutaneous BID   venlafaxine XR  225 mg Oral Q breakfast   Continuous Infusions:  methocarbamol (ROBAXIN) IV     PRN Meds: HYDROcodone-acetaminophen, methocarbamol **OR** methocarbamol (ROBAXIN) IV, morphine injection  Allergies:    Allergies   Allergen Reactions   Buspirone Hcl     lethargic/tired   Metformin And Related Diarrhea   Ozempic (0.25 Or 0.5 Mg-Dose) [Semaglutide(0.25 Or 0.'5mg'$ -Dos)]     Other reaction(s): nausea   Ultram [Tramadol] Nausea And Vomiting  Nickel Rash    Pt unable to wear jewelry made of nickel.   Pneumococcal Vaccines Swelling and Rash    At injection set    Social History:   Social History   Socioeconomic History   Marital status: Widowed    Spouse name: Not on file   Number of children: 3   Years of education: Not on file   Highest education level: High school graduate  Occupational History   Occupation: Air cabin crew  Tobacco Use   Smoking status: Unknown    Passive exposure: Never   Smokeless tobacco: Never  Vaping Use   Vaping Use: Never used  Substance and Sexual Activity   Alcohol use: No   Drug use: No   Sexual activity: Never  Other Topics Concern   Not on file  Social History Narrative   04/19/21 lives alone, dgtr lives close by   Social Determinants of Health   Financial Resource Strain: Not on file  Food Insecurity: No Food Insecurity (12/20/2021)   Hunger Vital Sign    Worried About Running Out of Food in the Last Year: Never true    [place] in the Last Year: Never true  Transportation Needs: No Transportation Needs (12/20/2021)   PRAPARE - Hydrologist (Medical): No    Lack of Transportation (Non-Medical): No  Physical Activity: Not on file  Stress: Not on file  Social Connections: Not on file  Intimate Partner Violence: Not At Risk (12/20/2021)   Humiliation, Afraid, Rape, and Kick questionnaire    Fear of Current or Ex-Partner: No    Emotionally Abused: No    Physically Abused: No    Sexually Abused: No    Family History:    Family History  Problem Relation Age of Onset   Alcohol abuse Mother    Cancer Father        prostate   Hypertension Father    Heart attack Paternal Grandfather     Stroke Neg Hx      ROS:  Please see the history of present illness.   All other ROS reviewed and negative.     Physical Exam/Data:   Vitals:   05/27/22 0727 05/27/22 0951 05/27/22 1051 05/27/22 1054  BP: (!) 117/59     Pulse: 66 64 72   Resp: 16     Temp: 99.1 F (37.3 C) 97.6 F (36.4 C) (!) 97.3 F (36.3 C)   TempSrc: Oral     SpO2: 98% 97%  95%  Weight:      Height:        Intake/Output Summary (Last 24 hours) at 05/27/2022 1438 Last data filed at 05/26/2022 2312 Gross per 24 hour  Intake 500 ml  Output 0 ml  Net 500 ml      05/27/2022    2:16 AM 05/26/2022    8:31 PM 05/23/2022    7:40 PM  Last 3 Weights  Weight (lbs) 132 lb 15 oz 145 lb 15.1 oz 146 lb  Weight (kg) 60.3 kg 66.2 kg 66.225 kg     Body mass index is 20.82 kg/m.  General:  Well nourished, well developed, in no acute distress HEENT: normal Neck: no JVD Vascular: No carotid bruits; Distal pulses 2+ bilaterally Cardiac:  normal S1, S2; RRR; 3/6 heart murmur at RUSB Lungs:  clear to auscultation bilaterally, no wheezing, rhonchi or rales  Abd: soft, nontender, no hepatomegaly  Ext: no edema Musculoskeletal:  No deformities, BUE  and BLE strength normal and equal Skin: warm and dry  Neuro:  CNs 2-12 intact, no focal abnormalities noted Psych:  Normal affect   EKG:  The EKG was personally reviewed and demonstrates:  NSR without significant ST-T wave changes Telemetry:  Telemetry was personally reviewed and demonstrates:  NSR with single episode of transient SVT  Relevant CV Studies:  Cath 01/22/2019 Mid RCA lesion is 50% stenosed. RPDA lesion is 50% stenosed. Prox RCA lesion is 30% stenosed. Mid Cx lesion is 20% stenosed. Ost LAD to Prox LAD lesion is 40% stenosed. Mid LAD lesion is 30% stenosed.   1. Mild non-obstructive disease in the LAD and Circumflex 2. Moderate non-obstructive disease in the mid and distal RCA, PDA.  3. Severe aortic stenosis by echo. Unable to cross the aortic valve in  the cath lab from the radial approach.    Recommendations: Continue workup for TAVR. Medical management of non-obstructive CAD.     Echo 05/27/2022  1. Circumferential extent of PVR (10_20%), location ~ 12 o'clock on PSAX  (images 30-31). Jet extent 0.75 cm of LVOT (image 68). In the PSAX image  series regurgitation looks severe and central. The aortic valve has been  repaired/replaced. Aortic valve  regurgitation is moderate to severe. There is a 23 mm Sapien prosthetic  (TAVR) valve present in the aortic position. Procedure Date: 01/22/2019.  Aortic valve mean gradient measures 13.0 mmHg.   2. The mitral valve has been repaired/replaced. Trivial mitral valve  regurgitation. Mild mitral stenosis. The mean mitral valve gradient is 4.0  mmHg with average heart rate of 76 bpm. There is a 26 mm Memo prosthetic  annuloplasty ring present in the  mitral position. Procedure Date: 10/11/2012.   3. The tricuspid valve is status post repair on 05/27/2013 with a 1m EMC3  annuloplasty ring.   4. Left ventricular ejection fraction, by estimation, is 65 to 70%. Left  ventricular ejection fraction by 3D volume is 70 %. The left ventricle has  normal function. The left ventricle has no regional wall motion  abnormalities. There is moderate  concentric left ventricular hypertrophy. Left ventricular diastolic  parameters are indeterminate.   5. Right ventricular systolic function is hyperdynamic. The right  ventricular size is normal.   Comparison(s): There appears to have been central and paravalvular  regurgitation in 2021, but regurgitation has increased from prior.   Conclusion(s)/Recommendation(s): Given multiple prosthetic valves,  recommend TEE for assessment of prosthetic valve function with a focus on  aortic valve regurgitation.    Laboratory Data:  High Sensitivity Troponin:   Recent Labs  Lab 05/26/22 2025 05/26/22 2233  TROPONINIHS 10 10     Chemistry Recent Labs  Lab  05/23/22 2023 05/23/22 2031 05/26/22 2025 05/26/22 2037 05/26/22 2233  NA 140 140 139 140  --   K 3.7 3.7 4.0 4.1  --   CL 105 106 102 105  --   CO2 23  --  24  --   --   GLUCOSE 99 99 152* 147*  --   BUN 27* 27* 26* 26*  --   CREATININE 0.80 0.70 0.78 0.70  --   CALCIUM 9.1  --  9.5  --   --   MG  --   --   --   --  1.9  GFRNONAA >60  --  >60  --   --   ANIONGAP 12  --  13  --   --     Recent Labs  Lab 05/23/22 2023 05/26/22 2025  PROT 6.7 6.7  ALBUMIN 3.6 3.7  AST 31 28  ALT 35 30  ALKPHOS 72 79  BILITOT 0.5 0.5   Lipids No results for input(s): "CHOL", "TRIG", "HDL", "LABVLDL", "LDLCALC", "CHOLHDL" in the last 168 hours.  Hematology Recent Labs  Lab 05/23/22 2023 05/23/22 2031 05/26/22 2025 05/26/22 2037  WBC 7.9  --  10.2  --   RBC 4.84  --  4.84  --   HGB 14.3 13.9 14.5 14.3  HCT 44.5 41.0 43.1 42.0  MCV 91.9  --  89.0  --   MCH 29.5  --  30.0  --   MCHC 32.1  --  33.6  --   RDW 13.6  --  13.5  --   PLT 198  --  198  --    Thyroid  Recent Labs  Lab 05/26/22 2320  TSH 2.375    BNPNo results for input(s): "BNP", "PROBNP" in the last 168 hours.  DDimer No results for input(s): "DDIMER" in the last 168 hours.   Radiology/Studies:  ECHOCARDIOGRAM COMPLETE  Result Date: 05/27/2022    ECHOCARDIOGRAM REPORT   Patient Name:   BAHJA DORING Date of Exam: 05/27/2022 Medical Rec #:  [id number]         Height:       67.0 in Accession #:    [id number]        Weight:       132.9 lb Date of Birth:  05-Jan-1941        BSA:          1.700 m Patient Age:    43 years          BP:           117/59 mmHg Patient Gender: F                 HR:           73 bpm. Exam Location:  Inpatient Procedure: 2D Echo, 3D Echo, Cardiac Doppler and Color Doppler                                MODIFIED REPORT:     This report was modified by Rudean Haskell MD on 05/27/2022 due to                                 Clarification.  Indications:     Z01.818 Encounter for other preprocedural  examination  History:         Patient has prior history of Echocardiogram examinations, most                  recent 01/30/2020. Stroke, Aortic Valve Disease, Mitral Valve                  Disease and Tricuspid valve disease.; Risk Factors:Hypertension                  and Diabetes. Aortic stenosis. Mitral valve repair. Tricuspid                  valve repair. Aortic valve replacement.                  Aortic Valve: 23 mm Sapien prosthetic, stented (TAVR) valve is  present in the aortic position. Procedure Date: 01/22/2019.                  Mitral Valve: 26 mm Memo prosthetic annuloplasty ring valve is                  present in the mitral position. Procedure Date: 10/11/2012.  Sonographer:     Roseanna Rainbow RDCS Referring Phys:  [place] Diagnosing Phys: Rudean Haskell MD IMPRESSIONS  1. Circumferential extent of PVR (10_20%), location ~ 12 o'clock on PSAX (images 30-31). Jet extent 0.75 cm of LVOT (image 68). In the PSAX image series regurgitation looks severe and central. The aortic valve has been repaired/replaced. Aortic valve regurgitation is moderate to severe. There is a 23 mm Sapien prosthetic (TAVR) valve present in the aortic position. Procedure Date: 01/22/2019. Aortic valve mean gradient measures 13.0 mmHg.  2. The mitral valve has been repaired/replaced. Trivial mitral valve regurgitation. Mild mitral stenosis. The mean mitral valve gradient is 4.0 mmHg with average heart rate of 76 bpm. There is a 26 mm Memo prosthetic annuloplasty ring present in the mitral position. Procedure Date: 10/11/2012.  3. The tricuspid valve is status post repair on 05/27/2013 with a 41m EMC3 annuloplasty ring.  4. Left ventricular ejection fraction, by estimation, is 65 to 70%. Left ventricular ejection fraction by 3D volume is 70 %. The left ventricle has normal function. The left ventricle has no regional wall motion abnormalities. There is moderate concentric left ventricular hypertrophy.  Left ventricular diastolic parameters are indeterminate.  5. Right ventricular systolic function is hyperdynamic. The right ventricular size is normal. Comparison(s): There appears to have been central and paravalvular regurgitation in 2021, but regurgitation has increased from prior. Conclusion(s)/Recommendation(s): Given multiple prosthetic valves, recommend TEE for assessment of prosthetic valve function with a focus on aortic valve regurgitation. FINDINGS  Left Ventricle: Left ventricular ejection fraction, by estimation, is 65 to 70%. Left ventricular ejection fraction by 3D volume is 70 %. The left ventricle has normal function. The left ventricle has no regional wall motion abnormalities. The left ventricular internal cavity size was small. There is moderate concentric left ventricular hypertrophy. Left ventricular diastolic parameters are indeterminate. Right Ventricle: The right ventricular size is normal. Right vetricular wall thickness was not well visualized. Right ventricular systolic function is hyperdynamic. Left Atrium: Left atrial size was normal in size. Right Atrium: Right atrial size was normal in size. Pericardium: There is no evidence of pericardial effusion. Mitral Valve: 2D MVA 2.24 cm2. The mitral valve has been repaired/replaced. Trivial mitral valve regurgitation. There is a 26 mm Memo prosthetic annuloplasty ring present in the mitral position. Procedure Date: 10/11/2012. Mild mitral valve stenosis. MV peak gradient, 9.7 mmHg. The mean mitral valve gradient is 4.0 mmHg with average heart rate of 76 bpm. Tricuspid Valve: Tricuspid Valve mean gradient 1 mm Hg at a heart rate of 79 bpm. The tricuspid valve is has been repaired/replaced. Tricuspid valve regurgitation is not demonstrated. The tricuspid valve is status post repair with an annuloplasty ring. Aortic Valve: Circumferential extent of PVR (10_20%), location 11- o'clock on PSAX (images 30-31). Jet extent 0.75 cm of LVOT (image 68). In  the PSAX image series regurgitation looks severe and central. The aortic valve has been repaired/replaced. Aortic  valve regurgitation is moderate to severe. Aortic regurgitation PHT measures 182 msec. Aortic valve mean gradient measures 13.0 mmHg. Aortic valve peak gradient measures 24.8 mmHg. Aortic valve area, by VTI measures 2.40 cm. There is  a 23 mm Sapien prosthetic, stented (TAVR) valve present in the aortic position. Procedure Date: 01/22/2019. Pulmonic Valve: The pulmonic valve was not well visualized. Pulmonic valve regurgitation is trivial. No evidence of pulmonic stenosis. Aorta: The aortic root and ascending aorta are structurally normal, with no evidence of dilitation. IAS/Shunts: No atrial level shunt detected by color flow Doppler.  LEFT VENTRICLE PLAX 2D LVIDd:         3.80 cm         Diastology LVIDs:         1.80 cm         LV e' medial:    4.79 cm/s LV PW:         1.10 cm         LV E/e' medial:  29.2 LV IVS:        1.30 cm         LV e' lateral:   7.40 cm/s LVOT diam:     2.20 cm         LV E/e' lateral: 18.9 LV SV:         101 LV SV Index:   59 LVOT Area:     3.80 cm        3D Volume EF                                LV 3D EF:    Left                                             ventricul LV Volumes (MOD)                            ar LV vol d, MOD    71.3 ml                    ejection A2C:                                        fraction LV vol d, MOD    51.5 ml                    by 3D A4C:                                        volume is LV vol s, MOD    17.8 ml                    70 %. A2C: LV vol s, MOD    12.6 ml A4C:                           3D Volume EF: LV SV MOD A2C:   53.5 ml       3D EF:        70 % LV SV MOD A4C:   51.5 ml       LV EDV:       105 ml LV SV MOD BP:  47.0 ml       LV ESV:       31 ml                                LV SV:        74 ml RIGHT VENTRICLE             IVC RV S prime:     14.60 cm/s  IVC diam: 1.60 cm TAPSE (M-mode): 1.1 cm LEFT ATRIUM             Index         RIGHT ATRIUM           Index LA diam:        3.20 cm 1.88 cm/m   RA Area:     15.10 cm LA Vol (A2C):   75.1 ml 44.18 ml/m  RA Volume:   37.20 ml  21.88 ml/m LA Vol (A4C):   39.8 ml 23.41 ml/m LA Biplane Vol: 54.2 ml 31.88 ml/m  AORTIC VALVE AV Area (Vmax):    2.49 cm AV Area (Vmean):   2.42 cm AV Area (VTI):     2.40 cm AV Vmax:           249.00 cm/s AV Vmean:          161.500 cm/s AV VTI:            0.421 m AV Peak Grad:      24.8 mmHg AV Mean Grad:      13.0 mmHg LVOT Vmax:         163.00 cm/s LVOT Vmean:        103.000 cm/s LVOT VTI:          0.266 m LVOT/AV VTI ratio: 0.63 AI PHT:            182 msec  AORTA Ao Root diam: 3.00 cm Ao Asc diam:  3.50 cm MITRAL VALVE MV Area (PHT): 2.91 cm     SHUNTS MV Area VTI:   2.11 cm     Systemic VTI:  0.27 m MV Peak grad:  9.7 mmHg     Systemic Diam: 2.20 cm MV Mean grad:  4.0 mmHg MV Vmax:       1.56 m/s MV Vmean:      97.5 cm/s MV Decel Time: 261 msec MV E velocity: 140.00 cm/s MV A velocity: 91.60 cm/s MV E/A ratio:  1.53 Rudean Haskell MD Electronically signed by Rudean Haskell MD Signature Date/Time: 05/27/2022/1:57:46 PM    Final (Updated)    CT CERVICAL SPINE WO CONTRAST  Result Date: 05/26/2022 CLINICAL DATA:  Fall.  Polytrauma, blunt EXAM: CT CERVICAL SPINE WITHOUT CONTRAST TECHNIQUE: Multidetector CT imaging of the cervical spine was performed without intravenous contrast. Multiplanar CT image reconstructions were also generated. RADIATION DOSE REDUCTION: This exam was performed according to the departmental dose-optimization program which includes automated exposure control, adjustment of the mA and/or kV according to patient size and/or use of iterative reconstruction technique. COMPARISON:  CTA head neck 01/13/2021 FINDINGS: Alignment: Normal. Skull base and vertebrae: Craniocervical alignment is normal. The atlantodental interval is not widened. Degenerative changes are seen surrounding the odontoid process without significant  associated canal stenosis. No acute fracture of the cervical spine. Vertebral body height is preserved. Soft tissues and spinal canal: No prevertebral fluid or swelling. No visible canal hematoma. Disc levels: There is diffuse intervertebral disc space narrowing,  endplate remodeling, disc calcification throughout the cervical spine in keeping with changes of advanced degenerative disc disease. The spinal canal is widely patent. Multilevel uncovertebral and facet arthrosis results in multilevel moderate to severe neuroforaminal narrowing, most severe on the right at C5-6. Upper chest: Negative. Other: 18 mm left thyroid nodule, stable since prior examination though mildly enlarged since remote prior examination of 09/27/2012. IMPRESSION: 1. No acute fracture or listhesis of the cervical spine. 2. 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 3. Stable left thyroid nodule. Recommend thyroid US (ref: J Am Coll Radiol. 2015 Feb;12(2): 143-50). Electronically Signed   By: Fidela Salisbury M.D.   On: 05/26/2022 21:24   CT HEAD WO CONTRAST  Result Date: 05/26/2022 CLINICAL DATA:  Trauma EXAM: CT HEAD WITHOUT CONTRAST TECHNIQUE: Contiguous axial images were obtained from the base of the skull through the vertex without intravenous contrast. RADIATION DOSE REDUCTION: This exam was performed according to the departmental dose-optimization program which includes automated exposure control, adjustment of the mA and/or kV according to patient size and/or use of iterative reconstruction technique. COMPARISON:  Head CT 05/23/2022 FINDINGS: Brain: No evidence of acute infarction, hemorrhage, hydrocephalus, extra-axial collection or mass lesion/mass effect. Again seen is mild diffuse atrophy. Vascular: Atherosclerotic calcifications are present within the cavernous internal carotid arteries. Skull: Normal.  Negative for fracture or focal lesion. Sinuses/Orbits: No acute finding. Other: None. IMPRESSION: 1. No acute intracranial process. 2. Mild diffuse atrophy. Electronically Signed   By: Ronney Asters M.D.   On: 05/26/2022 21:16   DG Knee Right Port  Result Date: 05/26/2022 CLINICAL DATA:  Blunt trauma, fall. EXAM: PORTABLE RIGHT KNEE - 1-2 VIEW COMPARISON:  None Available. FINDINGS: No acute fracture or dislocation. Severe tricompartmental degenerative changes are noted at the knee. There is a small suprapatellar joint effusion containing coarse calcifications, likely loose bodies. Vascular calcifications and vascular stent are noted in the soft tissues. IMPRESSION: 1. No acute fracture or dislocation. 2. Severe tricompartmental degenerative changes. 3. Small suprapatellar joint effusion with loose bodies. Electronically Signed   By: Brett Fairy M.D.   On: 05/26/2022 21:00   DG Hip Unilat W or Wo Pelvis 2-3 Views Right  Result Date: 05/26/2022 CLINICAL DATA:  Trauma, fall. EXAM: DG HIP (WITH OR WITHOUT PELVIS) 2-3V RIGHT COMPARISON:  12/19/2021. FINDINGS: There is a mildly displaced and comminuted intertrochanteric fracture of the proximal right femur. There are old fractures of the superior pubic ramus on the right and inferior pubic ramus on the left. No dislocation at the hips bilaterally. Mild degenerative changes are noted at the hips bilaterally and lower lumbar spine. Vascular calcifications and stent are noted in the right lower extremity. IMPRESSION: 1. Mildly displaced and comminuted intertrochanteric fracture of the right hip. 2. Old fractures of the superior pubic ramus on the right and inferior pubic ramus on the left. Electronically Signed   By: Brett Fairy M.D.   On: 05/26/2022 20:58   DG Chest Port 1 View  Result Date: 05/26/2022 CLINICAL DATA:  Level 2 trauma, fall. EXAM: PORTABLE CHEST 1 VIEW COMPARISON:  01/22/2019. FINDINGS: Heart is mildly enlarged and the mediastinal contour  stable. There is atherosclerotic calcification of the aorta. A TAVR stent, prosthetic cardiac valves and left atrial appendage clip are noted. Lung volumes are low. No consolidation, effusion, or pneumothorax. Sternotomy wires are present over the midline. No acute osseous abnormality. IMPRESSION: No active disease. Electronically  Signed   By: Brett Fairy M.D.   On: 05/26/2022 20:55   MR BRAIN WO CONTRAST  Result Date: 05/24/2022 CLINICAL DATA:  Initial evaluation for neuro deficit, stroke. EXAM: MRI HEAD WITHOUT CONTRAST TECHNIQUE: Multiplanar, multiecho pulse sequences of the brain and surrounding structures were obtained without intravenous contrast. COMPARISON:  Prior study from 05/23/2022. FINDINGS: Brain: Mild age-related cerebral atrophy. Few scattered patchy T2/FLAIR hyperintense foci involving the periventricular and deep white matter both cerebral hemispheres, most likely related to chronic microvascular ischemic disease, mild in nature. Few scattered small remote bilateral cerebellar infarcts noted. No evidence for acute or subacute ischemia. Gray-white matter differentiation maintained. No areas of chronic cortical infarction. No acute or chronic intracranial blood products. No mass lesion, midline shift or mass effect. No hydrocephalus or extra-axial fluid collection. Pituitary gland and suprasellar region within normal limits. Vascular: Major intracranial vascular flow voids are maintained. Skull and upper cervical spine: Cranial junction within normal limits. Bone marrow signal intensity normal. No scalp soft tissue abnormality. Sinuses/Orbits: Prior bilateral ocular lens replacement. Paranasal sinuses are largely clear. No significant mastoid effusion. Other: None. IMPRESSION: 1. No acute intracranial abnormality. 2. Mild age-related cerebral atrophy with chronic small vessel ischemic disease, with a few scattered small remote bilateral cerebellar infarcts. Electronically Signed   By: Jeannine Boga M.D.   On: 05/24/2022 02:38   CT ANGIO HEAD NECK W WO CM  Result Date: 05/24/2022 CLINICAL DATA:  Initial evaluation for neuro deficit, stroke suspected. EXAM: CT ANGIOGRAPHY HEAD AND NECK TECHNIQUE: Multidetector CT imaging of the head and neck was performed using the standard protocol during bolus administration of intravenous contrast. Multiplanar CT image reconstructions and MIPs were obtained to evaluate the vascular anatomy. Carotid stenosis measurements (when applicable) are obtained utilizing NASCET criteria, using the distal internal carotid diameter as the denominator. RADIATION DOSE REDUCTION: This exam was performed according to the departmental dose-optimization program which includes automated exposure control, adjustment of the mA and/or kV according to patient size and/or use of iterative reconstruction technique. CONTRAST:  87m OMNIPAQUE IOHEXOL 350 MG/ML SOLN COMPARISON:  None Available. FINDINGS: CT HEAD FINDINGS Brain: Mild age-related cerebral atrophy. No acute intracranial hemorrhage. No acute large vessel territory infarct. No mass lesion or midline shift. No hydrocephalus or extra-axial fluid collection. Vascular: No abnormal hyperdense vessel. Calcified atherosclerosis present at skull base. Skull: Scalp soft tissues and calvarium within normal limits. Sinuses/Orbits: Globes and orbital soft tissues within normal limits. Paranasal sinuses and mastoid air cells are clear. Other: None. Review of the MIP images confirms the above findings CTA NECK FINDINGS Aortic arch: Visualized aortic arch within normal limits for caliber with standard branch pattern. Atheromatous change about the arch itself. No stenosis about the origin the great vessels. Right carotid system: Right common and internal carotid arteries are tortuous but patent without dissection. Mild scattered atheromatous change without hemodynamically significant greater than 50% stenosis. Left carotid system: Left common  and internal carotid arteries are tortuous without dissection. Mild scattered atheromatous change without hemodynamically significant greater than 50% stenosis. Vertebral arteries: Both vertebral arteries arise from subclavian arteries. No significant proximal subclavian artery stenosis. Vertebral arteries patent without significant stenosis or dissection. Skeleton: No discrete or worrisome osseous lesions. Mild-to-moderate multilevel cervical spondylosis, most pronounced at C5-6 and C6-7. Patient is edentulous. Other neck: No other acute soft tissue abnormality within the neck. 17 mm left thyroid nodule, indeterminate. Upper chest: Scattered ground-glass density within the visualized lungs, likely mild pulmonary interstitial edema. Review of the MIP images  confirms the above findings CTA HEAD FINDINGS Anterior circulation: Atheromatous change within the carotid siphons, right worse than left. Associated up to moderate approximate 50% stenosis at the cavernous right ICA. A1 segments patent bilaterally. Normal anterior communicating artery complex. Both ACAs patent without significant stenosis. Mild atheromatous irregularity about the M1 segments without high-grade stenosis. Focal moderate proximal left M2 stenosis again noted. Distal MCA branches remain perfused and symmetric. Distal small vessel atheromatous irregularity. Posterior circulation: Both V4 segments patent without stenosis. Left vertebral artery slightly dominant. Both PICA patent at their origins. Focal moderate stenosis involving the proximal basilar artery (series 16, image 23). Superior cerebral arteries patent bilaterally. Severe tandem atheromatous stenoses noted at the left P2 segment (series 15, image 21). Atheromatous irregularity about the contralateral right PCA without high-grade stenosis. Venous sinuses: Not well assessed due to arterial timing the contrast bolus. Anatomic variants: None significant.  No aneurysm. Review of the MIP images  confirms the above findings IMPRESSION: CT HEAD IMPRESSION: 1. No acute intracranial abnormality. 2. Mild age-related cerebral atrophy with chronic small vessel ischemic disease. CTA HEAD AND NECK IMPRESSION: 1. Negative CTA for large vessel occlusion or other emergent finding. 2. Moderate atheromatous change about the carotid siphons with up to moderate stenosis on the right. 3. 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 4. Additional moderate stenoses involving a proximal left M2 branch and proximal basilar artery. 5. Scattered ground-glass density within the visualized lungs, likely mild pulmonary interstitial edema. 6. 17 mm left thyroid nodule, indeterminate. Further evaluation with dedicated thyroid ultrasound recommended. This could be performed on a nonemergent outpatient basis. (Ref: J Am Coll Radiol. 2015 Feb;12(2): 143-50). Electronically Signed   By: Jeannine Boga M.D.   On: 05/24/2022 00:28     Assessment and Plan:   Preoperative clearance: Patient requires right hip surgery tomorrow.  She denies any recent chest discomfort, she has balance issue and is not very active.  She cannot achieve more than 4 METS of activity at home.  Previous cardiac catheterization in September 2020 revealed mild to moderate nonobstructive CAD.  Echocardiogram obtained today showed normal EF 65%, however likely significant paravalvular leakage next to her TAVR valve.  Will discuss with MD, however patient will need orthopedic surgery to regain functional ability, therefore paravalvular leakage evaluation should be delayed until after her surgery.  I do not think her surgery risk is prohibitive.  Although she does have cardiac history, I think she would be a moderate risk patient for the intended procedure.  Her RCRI perioperative cardiac risk is class III, 6.6% of major cardiac event during perioperative period. Careful with volume resuscitation which may push her into heart failure with significant  aortic valve regurgitation  Right hip fracture: Occurred after a fall.  She previously had right shoulder and pubic rami fracture in September 2023.  Patient is unable to tell me how she fell, she does not remember if she tripped and fell or had dizzy spell prior to the falling episode.  Family says that she has poor balance.  Although she does have a rolling walker, she often chooses not to use it.  She also has dizziness when she stands up too quickly.  Pending orthopedic surgery tomorrow  History of TAVR with severe paravalvular leakage: Will review with MD, she likely will need orthopedic surgery in order to regain some degree of functional ability and quality of life.  Valve evaluation should be delayed until after orthopedic surgery.  Nonobstructive CAD: Last cardiac catheterization in  September 2020 that showed mild to moderate nonobstructive CAD with 50% mid RCA, 50% RPDA, 30% proximal RCA, 20% mid left circumflex, 40% ostial to proximal LAD and 30% mid LAD lesion.  Denies any recent chest pain  History of tricuspid and mitral annuloplasty: Stable on echocardiogram.  PAF: On Eliquis. H/o Maze in 2014 at the time of mitral and tricuspid annuloplasty. No recurrence since 2014.  Was previously on Xarelto, Xarelto was switched to Eliquis in July 2023 after presented with GI bleed.  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  Initially placed on antiplatelet on top of Eliquis, however had major GI bleed in July 2023 requiring discontinuation of antiplatelet therapy.  Was restarted on Plavix on top of Eliquis in December 2023 after lower extremity angiography.  PAD: Status post right lower extremity angiography and stenting on 03/14/2022.  Was placed on Plavix, Plavix has been held in anticipation of hip surgery  Hypertension  Hyperlipidemia: on  lipitor  DM2    Risk Assessment/Risk Scores:     For questions or updates, please contact [place] Please consult [email or web address] for contact info under    Hilbert Corrigan, [place]  05/27/2022 2:38 PM   Patient seen and examined. Agree with comprehensive workup, assessment and plan.  Rachel Santos is an 82 year old female with an extensive medical history including hypertension, hyperlipidemia, type 2 diabetes mellitus, nonobstructive CAD, valvular heart disease with previous severe MR and TR treated with annuloplasty surgery in 2004 and severe aortic stenosis treated with TAVR in October 2020.  She has a history of PAF, CVA, as well as peripheral vascular disease status post most recent lower extremity angiography and stenting of her right femoropopliteal, angioplasty of the right tibioperoneal trunk, angioplasty of the right peroneal artery.  She has been on combination therapy with Plavix and Eliquis.  Yesterday, she apparently fell and sustained a right hip fracture.  She denies any antecedent arrhythmia or chest pressure.  She was standing at the time.  She was wearing new slippers and is uncertain if she had slipped.  She has had some balance issues.  An echo Doppler study obtained today showed normal LV function with EF 65 to 70%, moderate LVH, TAVR valve had central moderately severe perivalvular regurgitation.  There was no significant stenosis with her mean gradient at 13 mmHg, normal for the valve.  She also had mitral annuloplasty with mild MS and tricuspid annuloplasty.  Presently, she feels well.  Blood pressure stable 120/60.  Pulse is in the 70s.  Telemetry is showing isolated episode of brief SVT which self terminated.  At present, will give clearance for the patient to undergo orthopedic surgery tomorrow.  Recommend postoperative cardiac telemetry.  Ultimately she will need a transesophageal echo for further assessment of prosthetic valve function and entire moderate to severe  aortic insufficiency involving her TAVR valve.  Plavix and Eliquis on hold for surgery.   Troy Sine, MD, [facility] 05/27/2022 3:40 PM
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
## 2022-05-28 ENCOUNTER — Inpatient Hospital Stay (HOSPITAL_COMMUNITY): Payer: Medicare Other | Admitting: Anesthesiology

## 2022-05-28 ENCOUNTER — Inpatient Hospital Stay (HOSPITAL_COMMUNITY): Payer: Medicare Other

## 2022-05-28 ENCOUNTER — Encounter (HOSPITAL_COMMUNITY): Payer: Self-pay | Admitting: Internal Medicine

## 2022-05-28 ENCOUNTER — Encounter (HOSPITAL_COMMUNITY)
Admission: EM | Disposition: A | Discharge status: Skilled Nursing Facility | Payer: Self-pay | Source: Home / Self Care | Attending: Internal Medicine

## 2022-05-28 ENCOUNTER — Other Ambulatory Visit: Payer: Self-pay

## 2022-05-28 DIAGNOSIS — E119 Type 2 diabetes mellitus without complications: Secondary | ICD-10-CM

## 2022-05-28 DIAGNOSIS — Z794 Long term (current) use of insulin: Secondary | ICD-10-CM

## 2022-05-28 DIAGNOSIS — I10 Essential (primary) hypertension: Secondary | ICD-10-CM

## 2022-05-28 DIAGNOSIS — S72141A Displaced intertrochanteric fracture of right femur, initial encounter for closed fracture: Secondary | ICD-10-CM

## 2022-05-28 HISTORY — PX: INTRAMEDULLARY (IM) NAIL INTERTROCHANTERIC: SHX5875

## 2022-05-28 LAB — GLUCOSE, CAPILLARY
Glucose-Capillary: 128 mg/dL — ABNORMAL HIGH (ref 70–99)
Glucose-Capillary: 141 mg/dL — ABNORMAL HIGH (ref 70–99)
Glucose-Capillary: 145 mg/dL — ABNORMAL HIGH (ref 70–99)
Glucose-Capillary: 170 mg/dL — ABNORMAL HIGH (ref 70–99)
Glucose-Capillary: 274 mg/dL — ABNORMAL HIGH (ref 70–99)
Glucose-Capillary: 296 mg/dL — ABNORMAL HIGH (ref 70–99)

## 2022-05-28 LAB — BASIC METABOLIC PANEL
Anion gap: 12 (ref 5–15)
BUN: 29 mg/dL — ABNORMAL HIGH (ref 8–23)
CO2: 25 mmol/L (ref 22–32)
Calcium: 8.7 mg/dL — ABNORMAL LOW (ref 8.9–10.3)
Chloride: 99 mmol/L (ref 98–111)
Creatinine, Ser: 0.85 mg/dL (ref 0.44–1.00)
GFR, Estimated: >60 mL/min (ref >60)
Glucose, Bld: 138 mg/dL — ABNORMAL HIGH (ref 70–99)
Potassium: 4.3 mmol/L (ref 3.5–5.1)
Sodium: 136 mmol/L (ref 135–145)

## 2022-05-28 LAB — CBC
HCT: 37.3 % (ref 36.0–46.0)
Hemoglobin: 12.5 g/dL (ref 12.0–15.0)
MCH: 29.9 pg (ref 26.0–34.0)
MCHC: 33.5 g/dL (ref 30.0–36.0)
MCV: 89.2 fL (ref 80.0–100.0)
Platelets: 160 10*3/uL (ref 150–400)
RBC: 4.18 MIL/uL (ref 3.87–5.11)
RDW: 14.1 % (ref 11.5–15.5)
WBC: 11.2 10*3/uL — ABNORMAL HIGH (ref 4.0–10.5)
nRBC: 0 % (ref 0.0–0.2)

## 2022-05-28 LAB — SURGICAL PCR SCREEN
MRSA, PCR: NEGATIVE
Staphylococcus aureus: POSITIVE — AB

## 2022-05-28 LAB — MAGNESIUM: Magnesium: 2 mg/dL (ref 1.7–2.4)

## 2022-05-28 LAB — HEMOGLOBIN A1C
Hgb A1c MFr Bld: 9.8 % — ABNORMAL HIGH (ref 4.8–5.6)
Mean Plasma Glucose: 235 mg/dL

## 2022-05-28 SURGERY — FIXATION, FRACTURE, INTERTROCHANTERIC, WITH INTRAMEDULLARY ROD
Anesthesia: General | Laterality: Right

## 2022-05-28 MED ORDER — LACTATED RINGERS IV SOLN: INTRAVENOUS | Status: DC

## 2022-05-28 MED ORDER — POVIDONE-IODINE 10 % EX SWAB
2.0000 | Freq: Once | CUTANEOUS | Status: AC
  Administered 2022-05-28: 2 via TOPICAL

## 2022-05-28 MED ORDER — PROPOFOL 10 MG/ML IV BOLUS
INTRAVENOUS | Status: AC
  Filled 2022-05-28: qty 20

## 2022-05-28 MED ORDER — ONDANSETRON HCL 4 MG PO TABS: 4.0000 mg | ORAL_TABLET | Freq: Four times a day (QID) | ORAL | Status: DC | PRN

## 2022-05-28 MED ORDER — SUGAMMADEX SODIUM 200 MG/2ML IV SOLN
INTRAVENOUS | Status: DC | PRN
  Administered 2022-05-28: 200 mg via INTRAVENOUS

## 2022-05-28 MED ORDER — SODIUM CHLORIDE 0.9 % IV SOLN: INTRAVENOUS | Status: DC

## 2022-05-28 MED ORDER — CEFAZOLIN SODIUM-DEXTROSE 2-4 GM/100ML-% IV SOLN
2.0000 g | INTRAVENOUS | Status: AC
  Administered 2022-05-28: 2 g via INTRAVENOUS

## 2022-05-28 MED ORDER — LIDOCAINE 2% (20 MG/ML) 5 ML SYRINGE
INTRAMUSCULAR | Status: DC | PRN
  Administered 2022-05-28: 60 mg via INTRAVENOUS

## 2022-05-28 MED ORDER — ONDANSETRON HCL 4 MG/2ML IJ SOLN: 4.0000 mg | Freq: Four times a day (QID) | INTRAMUSCULAR | Status: DC | PRN

## 2022-05-28 MED ORDER — PROPOFOL 10 MG/ML IV BOLUS
INTRAVENOUS | Status: DC | PRN
  Administered 2022-05-28: 20 mg via INTRAVENOUS
  Administered 2022-05-28: 50 mg via INTRAVENOUS

## 2022-05-28 MED ORDER — DOCUSATE SODIUM 100 MG PO CAPS
100.0000 mg | ORAL_CAPSULE | Freq: Two times a day (BID) | ORAL | Status: DC
  Administered 2022-05-28 – 2022-06-02 (×10): 100 mg via ORAL
  Filled 2022-05-28 (×11): qty 1

## 2022-05-28 MED ORDER — ONDANSETRON HCL 4 MG/2ML IJ SOLN
INTRAMUSCULAR | Status: DC | PRN
  Administered 2022-05-28: 4 mg via INTRAVENOUS

## 2022-05-28 MED ORDER — METOCLOPRAMIDE HCL 5 MG/ML IJ SOLN: 5.0000 mg | Freq: Three times a day (TID) | INTRAMUSCULAR | Status: DC | PRN

## 2022-05-28 MED ORDER — ONDANSETRON HCL 4 MG/2ML IJ SOLN: 4.0000 mg | Freq: Once | INTRAMUSCULAR | Status: DC | PRN

## 2022-05-28 MED ORDER — FENTANYL CITRATE (PF) 250 MCG/5ML IJ SOLN
INTRAMUSCULAR | Status: DC | PRN
  Administered 2022-05-28 (×5): 50 ug via INTRAVENOUS

## 2022-05-28 MED ORDER — DEXAMETHASONE SODIUM PHOSPHATE 10 MG/ML IJ SOLN
INTRAMUSCULAR | Status: DC | PRN
  Administered 2022-05-28: 4 mg via INTRAVENOUS

## 2022-05-28 MED ORDER — POLYETHYLENE GLYCOL 3350 17 G PO PACK: 17.0000 g | PACK | Freq: Every day | ORAL | Status: DC | PRN

## 2022-05-28 MED ORDER — CHLORHEXIDINE GLUCONATE CLOTH 2 % EX PADS
6.0000 | MEDICATED_PAD | Freq: Every day | CUTANEOUS | Status: DC
  Administered 2022-05-30 – 2022-06-01 (×3): 6 via TOPICAL

## 2022-05-28 MED ORDER — MUPIROCIN 2 % EX OINT
1.0000 | TOPICAL_OINTMENT | Freq: Two times a day (BID) | CUTANEOUS | Status: DC
  Administered 2022-05-28 – 2022-06-02 (×9): 1 via NASAL
  Filled 2022-05-28 (×3): qty 22

## 2022-05-28 MED ORDER — ORAL CARE MOUTH RINSE: 15.0000 mL | Freq: Once | OROMUCOSAL | Status: AC

## 2022-05-28 MED ORDER — CHLORHEXIDINE GLUCONATE 0.12 % MT SOLN
OROMUCOSAL | Status: AC
  Administered 2022-05-28: 15 mL via OROMUCOSAL
  Filled 2022-05-28: qty 15

## 2022-05-28 MED ORDER — ROCURONIUM BROMIDE 10 MG/ML (PF) SYRINGE
PREFILLED_SYRINGE | INTRAVENOUS | Status: DC | PRN
  Administered 2022-05-28: 30 mg via INTRAVENOUS

## 2022-05-28 MED ORDER — CHLORHEXIDINE GLUCONATE 0.12 % MT SOLN: 15.0000 mL | Freq: Once | OROMUCOSAL | Status: AC

## 2022-05-28 MED ORDER — INSULIN ASPART 100 UNIT/ML IJ SOLN: 0.0000 [IU] | INTRAMUSCULAR | Status: DC | PRN

## 2022-05-28 MED ORDER — AMISULPRIDE (ANTIEMETIC) 5 MG/2ML IV SOLN: 10.0000 mg | Freq: Once | INTRAVENOUS | Status: DC | PRN

## 2022-05-28 MED ORDER — CHLORHEXIDINE GLUCONATE 4 % EX LIQD
60.0000 mL | Freq: Once | CUTANEOUS | Status: DC
  Administered 2022-05-28: 4 via TOPICAL

## 2022-05-28 MED ORDER — CEFAZOLIN SODIUM-DEXTROSE 2-4 GM/100ML-% IV SOLN
2.0000 g | Freq: Three times a day (TID) | INTRAVENOUS | Status: AC
  Administered 2022-05-28 – 2022-05-29 (×3): 2 g via INTRAVENOUS
  Filled 2022-05-28 (×3): qty 100

## 2022-05-28 MED ORDER — FENTANYL CITRATE (PF) 100 MCG/2ML IJ SOLN: 25.0000 ug | INTRAMUSCULAR | Status: DC | PRN

## 2022-05-28 MED ORDER — FENTANYL CITRATE (PF) 250 MCG/5ML IJ SOLN
INTRAMUSCULAR | Status: AC
  Filled 2022-05-28: qty 5

## 2022-05-28 MED ORDER — 0.9 % SODIUM CHLORIDE (POUR BTL) OPTIME
TOPICAL | Status: DC | PRN
  Administered 2022-05-28: 1000 mL

## 2022-05-28 MED ORDER — PHENYLEPHRINE 80 MCG/ML (10ML) SYRINGE FOR IV PUSH (FOR BLOOD PRESSURE SUPPORT)
PREFILLED_SYRINGE | INTRAVENOUS | Status: DC | PRN
  Administered 2022-05-28 (×2): 80 ug via INTRAVENOUS

## 2022-05-28 MED ORDER — PHENYLEPHRINE HCL-NACL 20-0.9 MG/250ML-% IV SOLN
INTRAVENOUS | Status: DC | PRN
  Administered 2022-05-28: 25 ug/min via INTRAVENOUS

## 2022-05-28 MED ORDER — ACETAMINOPHEN 10 MG/ML IV SOLN: 1000.0000 mg | Freq: Once | INTRAVENOUS | Status: DC | PRN

## 2022-05-28 MED ORDER — CEFAZOLIN SODIUM-DEXTROSE 2-4 GM/100ML-% IV SOLN
INTRAVENOUS | Status: AC
  Filled 2022-05-28: qty 100

## 2022-05-28 MED ORDER — METOCLOPRAMIDE HCL 5 MG PO TABS: 5.0000 mg | ORAL_TABLET | Freq: Three times a day (TID) | ORAL | Status: DC | PRN

## 2022-05-28 SURGICAL SUPPLY — 49 items
ADH SKN CLS APL DERMABOND .7 (GAUZE/BANDAGES/DRESSINGS) ×1
APL PRP STRL LF DISP 70% ISPRP (MISCELLANEOUS) ×1
BAG COUNTER SPONGE SURGICOUNT (BAG) IMPLANT
BAG SPNG CNTER NS LX DISP (BAG)
BIT DRILL INTERTAN LAG SCREW (BIT) IMPLANT
BIT DRILL LONG 4.0 (BIT) IMPLANT
BRUSH SCRUB EZ PLAIN DRY (MISCELLANEOUS) ×4 IMPLANT
CHLORAPREP W/TINT 26 (MISCELLANEOUS) ×2 IMPLANT
COVER PERINEAL POST (MISCELLANEOUS) ×2 IMPLANT
COVER SURGICAL LIGHT HANDLE (MISCELLANEOUS) ×2 IMPLANT
DERMABOND ADVANCED .7 DNX12 (GAUZE/BANDAGES/DRESSINGS) ×2 IMPLANT
DRAPE C-ARM 35X43 STRL (DRAPES) ×2 IMPLANT
DRAPE IMP U-DRAPE 54X76 (DRAPES) ×4 IMPLANT
DRAPE INCISE IOBAN 66X45 STRL (DRAPES) ×2 IMPLANT
DRAPE STERI IOBAN 125X83 (DRAPES) ×2 IMPLANT
DRAPE SURG 17X23 STRL (DRAPES) ×4 IMPLANT
DRAPE U-SHAPE 47X51 STRL (DRAPES) ×2 IMPLANT
DRESSING MEPILEX FLEX 4X4 (GAUZE/BANDAGES/DRESSINGS) IMPLANT
DRILL BIT LONG 4.0 (BIT) ×1
DRSG MEPILEX BORDER 4X4 (GAUZE/BANDAGES/DRESSINGS) ×2 IMPLANT
DRSG MEPILEX BORDER 4X8 (GAUZE/BANDAGES/DRESSINGS) ×2 IMPLANT
DRSG MEPILEX FLEX 4X4 (GAUZE/BANDAGES/DRESSINGS) ×2
ELECT REM PT RETURN 9FT ADLT (ELECTROSURGICAL) ×1
ELECTRODE REM PT RTRN 9FT ADLT (ELECTROSURGICAL) ×2 IMPLANT
GLOVE BIO SURGEON STRL SZ 6.5 (GLOVE) ×6 IMPLANT
GLOVE BIO SURGEON STRL SZ7.5 (GLOVE) ×8 IMPLANT
GLOVE BIOGEL PI IND STRL 6.5 (GLOVE) ×2 IMPLANT
GLOVE BIOGEL PI IND STRL 7.5 (GLOVE) ×2 IMPLANT
GOWN STRL REUS W/ TWL LRG LVL3 (GOWN DISPOSABLE) ×2 IMPLANT
GOWN STRL REUS W/TWL LRG LVL3 (GOWN DISPOSABLE) ×1
GUIDE PIN 3.2X343 (PIN) ×2
GUIDE PIN 3.2X343MM (PIN) ×2
KIT BASIN OR (CUSTOM PROCEDURE TRAY) ×2 IMPLANT
KIT TURNOVER KIT B (KITS) ×2 IMPLANT
MANIFOLD NEPTUNE II (INSTRUMENTS) ×2 IMPLANT
NAIL INTERTAN 10X18 130D 10S (Nail) IMPLANT
NS IRRIG 1000ML POUR BTL (IV SOLUTION) ×2 IMPLANT
PACK GENERAL/GYN (CUSTOM PROCEDURE TRAY) ×2 IMPLANT
PAD ARMBOARD 7.5X6 YLW CONV (MISCELLANEOUS) ×4 IMPLANT
PIN GUIDE 3.2X343MM (PIN) IMPLANT
SCREW LAG COMPR KIT 95/90 (Screw) IMPLANT
SCREW TRIGEN LOW PROF 5.0X35 (Screw) IMPLANT
SUT MNCRL AB 3-0 PS2 18 (SUTURE) ×2 IMPLANT
SUT VIC AB 0 CT1 27 (SUTURE) ×1
SUT VIC AB 0 CT1 27XBRD ANBCTR (SUTURE) IMPLANT
SUT VIC AB 2-0 CT1 27 (SUTURE) ×2
SUT VIC AB 2-0 CT1 TAPERPNT 27 (SUTURE) ×4 IMPLANT
TOWEL GREEN STERILE (TOWEL DISPOSABLE) ×4 IMPLANT
WATER STERILE IRR 1000ML POUR (IV SOLUTION) ×2 IMPLANT

## 2022-05-28 NOTE — Op Note (Signed)
Orthopaedic Surgery Operative Note (CSN: [id number] ) Date of Surgery: 05/28/2022  Admit Date: 05/26/2022   Diagnoses: Pre-Op Diagnoses: Right intertrochanteric femur fracture  Post-Op Diagnosis: Same  Procedures: CPT 27245-Cephalomedullary nailing of right intertrochanteric femur fracture  Surgeons : Primary: Shona Needles, MD  Assistant: None  Location: OR 6   Anesthesia: General   Antibiotics: Ancef 2g preop    Tourniquet time: None    Estimated Blood Loss: [id number]  Complications:None   Specimens:None   Implants: Implant Name Type Inv. Item Serial No. Manufacturer Lot No. LRB No. Used Action  NAIL INTERTAN 10X18 130D 10S - [id number] Nail NAIL INTERTAN 10X18 130D 10S  [employer] [id number] Right 1 Implanted  SCREW LAG COMPR KIT 95/90 - [id number] Screw SCREW LAG COMPR KIT 95/90  [employer] [id number] Right 1 Implanted  SCREW TRIGEN LOW PROF 5.0X35 - [id number] Screw SCREW TRIGEN LOW PROF 5.0X35  [employer] [id number] Right 1 Implanted     Indications for Surgery: 82 year old female who sustained a ground-level fall and a right intertrochanteric femur fracture.  Due to the unstable nature of her injury I recommend proceeding with cephalomedullary nailing of the right hip.  Risks and benefits were discussed with the patient and her daughter.  Risks included but not limited to bleeding, infection, malunion, nonunion, hardware failure, hardware irritation, nerve or blood vessel injury, DVT, even the possibility anesthetic complications.  They agreed to proceed with surgery and consent was obtained.  Operative Findings: Cephalomedullary nailing of the right intertrochanteric femur fracture using [employer] InterTAN 10 x 180 mm nail with a 95 mm lag screw/90 mm compression screw  Procedure: The patient was identified in the preoperative holding area. Consent was confirmed with the patient and their family and all questions  were answered. The operative extremity was marked after confirmation with the patient. she was then brought back to the operating room by our anesthesia colleagues.  She was placed under general anesthetic and carefully transferred over to the [facility] table.  All bony prominences were well-padded.  Traction was applied to the right lower extremity and fluoroscopic imaging showed the unstable nature of her injury.  The right lower extremity was then prepped and draped in usual sterile fashion.  A timeout was performed to verify the patient, the procedure, and the extremity.  Preoperative antibiotics were dosed.  A small incision was made proximal to the greater trochanter and carried down through skin and subcutaneous tissue.  A threaded guidewire was directed at the tip of the greater trochanter and advanced into the proximal metaphysis.  An entry reamer was used to enter the medullary canal.  A 10 x 180 mm nail was then passed down the central canal attached to a targeting arm.  I then directed a threaded guidewire into the head neck segment.  I confirmed adequate tip apex distance with this guidewire.  I then measured and then drilled the path for the compression screw and placed an antirotation bar.  I then drilled the path for the lag screw and then placed the lag screw.  The compression screw was then placed and approximately 7 mm compression was obtained through the fracture.  The proximal portion of the nail was statically locked.  I then used the targeting arm to place a distal interlocking screw from lateral to medial.  The targeting arm was removed final fluoroscopic imaging was obtained.  The incisions were copiously irrigated.  Layered closure of 2-0 Vicryl and Dermabond  was used to close the skin.  Sterile dressings were applied.  The patient was then awoke from anesthesia and taken to the PACU in stable condition.  Post Op Plan/Instructions: The patient will be weightbearing as tolerated to the  right lower extremity.  She will receive postoperative Ancef.  I will recommend placement on Eliquis starting postoperative day 1 if her hemoglobin is stable.  Will have her mobilize with physical and Occupational Therapy.  I was present and performed the entire surgery.  Patrecia Pace, PA-C did assist me throughout the case. An assistant was necessary given the difficulty in approach, maintenance of reduction and ability to instrument the fracture.   Katha Hamming, MD [facility]

## 2022-05-28 NOTE — Anesthesia Postprocedure Evaluation (Signed)
Anesthesia Post Note  Patient: Rachel Santos  Procedure(s) Performed: INTRAMEDULLARY (IM) NAIL INTERTROCHANTERIC (Right)     Patient location during evaluation: PACU Anesthesia Type: General Level of consciousness: awake Pain management: pain level controlled Vital Signs Assessment: post-procedure vital signs reviewed and stable Respiratory status: spontaneous breathing, nonlabored ventilation and respiratory function stable Cardiovascular status: blood pressure returned to baseline and stable Postop Assessment: no apparent nausea or vomiting Anesthetic complications: no   No notable events documented.  Last Vitals:  Vitals:   05/28/22 0935 05/28/22 1003  BP: (!) 115/43 (!) 125/47  Pulse: 76 78  Resp: 15 15  Temp: 36.9 C 36.6 C  SpO2: 96% 96%    Last Pain:  Vitals:   05/28/22 1003  TempSrc:   PainSc: 0-No pain                 [name] P [name]

## 2022-05-28 NOTE — Interval H&P Note (Signed)
History and Physical Interval Note:  05/28/2022 7:29 AM  Rachel Santos  has presented today for surgery, with the diagnosis of Right intertrochanteric femur fracture.  The various methods of treatment have been discussed with the patient and family. After consideration of risks, benefits and other options for treatment, the patient has consented to  Procedure(s): INTRAMEDULLARY (IM) NAIL INTERTROCHANTERIC (Right) as a surgical intervention.  The patient's history has been reviewed, patient examined, no change in status, stable for surgery.  I have reviewed the patient's chart and labs.  Questions were answered to the patient's satisfaction.     Lennette Bihari P Keeley Sussman

## 2022-05-28 NOTE — TOC CAGE-AID Note (Signed)
Transition of Care [facility]) - CAGE-AID Screening   Patient Details  Name: Rachel Santos MRN: [id number] Date of Birth: Aug 11, 1940   Elvina Sidle, RN Trauma Response Nurse Phone Number: [phone] 05/28/2022, 6:46 PM    CAGE-AID Screening:    Have You Ever Felt You Ought to Cut Down on Your Drinking or Drug Use?: No Have People Annoyed You By Critizing Your Drinking Or Drug Use?: No Have You Felt Bad Or Guilty About Your Drinking Or Drug Use?: No Have You Ever Had a Drink or Used Drugs First Thing In The Morning to Steady Your Nerves or to Get Rid of a Hangover?: No CAGE-AID Score: 0  Substance Abuse Education Offered: No (pt denies use of alcohol)

## 2022-05-28 NOTE — Anesthesia Procedure Notes (Signed)
Procedure Name: Intubation Date/Time: 05/28/2022 8:00 AM  Performed by: Clearnce Sorrel, CRNAPre-anesthesia Checklist: Patient identified, Emergency Drugs available, Suction available and Patient being monitored Patient Re-evaluated:Patient Re-evaluated prior to induction Oxygen Delivery Method: Circle System Utilized Preoxygenation: Pre-oxygenation with 100% oxygen Induction Type: IV induction Ventilation: Mask ventilation without difficulty Laryngoscope Size: Mac and 3 Grade View: Grade I Tube type: Oral Tube size: 7.0 mm Number of attempts: 1 Airway Equipment and Method: Stylet and Oral airway Placement Confirmation: ETT inserted through vocal cords under direct vision, positive ETCO2 and breath sounds checked- equal and bilateral Secured at: 21 cm Tube secured with: Tape Dental Injury: Teeth and Oropharynx as per pre-operative assessment

## 2022-05-28 NOTE — Progress Notes (Signed)
Skin tear noted on right lower lateral arm,,bruised, no dranage 1cm in length.

## 2022-05-28 NOTE — Transfer of Care (Signed)
Immediate Anesthesia Transfer of Care Note  Patient: Rachel Santos  Procedure(s) Performed: INTRAMEDULLARY (IM) NAIL INTERTROCHANTERIC (Right)  Patient Location: PACU  Anesthesia Type:General  Level of Consciousness: awake and alert   Airway & Oxygen Therapy: Patient Spontanous Breathing  Post-op Assessment: Report given to RN and Post -op Vital signs reviewed and stable  Post vital signs: Reviewed and stable  Last Vitals:  Vitals Value Taken Time  BP 109/92 05/28/22 0907  Temp    Pulse 77 05/28/22 0911  Resp 23 05/28/22 0911  SpO2 98 % 05/28/22 0911  Vitals shown include unvalidated device data.  Last Pain:  Vitals:   05/28/22 [id number]  TempSrc: Oral  PainSc:       Patients Stated Pain Goal: 4 ([id number])  Complications: No notable events documented.

## 2022-05-28 NOTE — Progress Notes (Signed)
PROGRESS NOTE    Rachel Santos  [id number] DOB: 10-30-1940 DOA: 05/26/2022 PCP: Charlane Ferretti, MD    Brief Narrative:   Rachel Santos is a 82 y.o. female with past medical history significant for paroxysmal atrial fibrillation on Eliquis, severe aortic stenosis s/p TAVR, mitral/tricuspid valve disease s/p repair, CAD, history of CVA with severe left MCA stenosis, CKD stage IIIa, type 2 diabetes mellitus, essential hypertension, hyperlipidemia, depression/anxiety, dementia who presented to [facility] ED on 2/29 complaining of right hip pain after fall at home.  Patient currently lives alone with daughter checking on her twice daily.  Life alert was activated and EMS had to forcibly enter her residence.  Approximately down for 30 minutes.  Denies loss of consciousness.  Patient was given IV fentanyl and transferred by EMS to ED for further evaluation.  In the ED, temperature 97.3 F, HR 74, RR 11, BP 132/68, SpO2 98% on room air.  Sodium 139, potassium 4.0, chloride 102, CO2 24, glucose 152, BUN 26, creatinine 0.78.  AST 28, ALT 30, total bilirubin 0.5.  CK 43, high sensitive troponin 10 followed by 10.  Lactic acid 2.1.  WBC 10.2, hemoglobin 14.5, platelets 198.  EtOH level less than 10.  CT head without contrast with no acute intracranial process, mild diffuse atrophy.  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  Chest x-ray with no active cardiopulmonary disease process.  Right hip/pelvis x-ray with mildly displaced and comminuted intratrochanteric fracture of the right hip, old fracture of the superior pubic ramus on the right and inferior pubic ramus on the left.  Right knee x-ray with no acute fracture or dislocation, severe tricompartmental degenerative changes, small suprapatellar joint effusion with loose  bodies.  Orthopedics was consulted.  TRH consulted for admission for further evaluation management of acute right hip fracture.  Assessment & Plan:   Displaced/comminuted intratrochanteric fracture right hip Patient presenting to the ED via EMS after mechanical fall at home with immediate right hip pain and inability to ambulate. Right hip/pelvis x-ray with mildly displaced and comminuted intratrochanteric fracture of the right hip.  Orthopedics was consulted and patient underwent cephalomedullary nailing of right hip by Dr. Doreatha Martin on 05/28/2022. -- Orthopedics following, appreciate assistance -- Vitamin D 25 hydroxy level 45.53 -- Norco 1-2 tablets every 6 hours as needed moderate pain -- Morphine 0.5 mg IV every 2 hours as needed severe pain -- Robaxin 5 mg p.o./IV every 6 hours as needed muscle spasms -- Orthopedics plans to restart Eliquis tomorrow as long as hemoglobin remained stable -- PT/OT evaluation: Pending  Paroxysmal atrial fibrillation on Eliquis -- Holding Eliquis as above -- Monitor on telemetry  Severe aortic stenosis s/p TAVR Mitral/tricuspid valve disease s/p repair, CAD Chronic diastolic congestive heart failure, compensated Hx essential hypertension TTE 05/27/2022 with LVEF 65 to 70%, no LV regional wall motion normalities, concentric LVH, severe PVR, moderate/severe AR, noted TAVR valve in aortic position, trivial MR, mild mitral stenosis, noted mitral/tricuspid valve repair.   Currently not on antihypertensives outpatient. -- Cardiology following, appreciate assistance -- Cardiology recommends TEE for further evaluation, will defer to their service for timing of procedure  Hx of CVA with severe left MCA stenosis Hx TIA Recent MR brain 05/24/2022 with no acute findings. -- Continue statin -- Holding Plavix, Eliquis  CKD stage IIIa Creatinine 0.70, at baseline; stable  Type 2 diabetes  mellitus with hyperglycemia Home regimen includes Levemir 28 units BID, Farxiga.   Hemoglobin A1c 8.3 on 10/17/2021, not optimally controlled. -- Levemir 10 units [place] twice daily -- Sensitive SSI for coverage -- CBGs q4h while NPO  Hyperlipidemia -- Atorvastatin 80 mg p.o. daily  Depression/anxiety -- Venlafaxine XR to 25 mg p.o. daily  Dementia --Delirium precautions --Get up during the day --Encourage a familiar face to remain present throughout the day --Keep blinds open and lights on during daylight hours --Minimize the use of opioids/benzodiazepines  DVT prophylaxis: SCDs Start: 05/28/22 1028 SCDs Start: 05/26/22 2310    Code Status: Full Code Family Communication: Daughter and multiple family members present at bedside this morning  Disposition Plan:  Level of care: Telemetry Medical Status is: Inpatient Remains inpatient appropriate because: Pending PT/OT evaluation, anticipate likely need of SNF placement    Consultants:  Orthopedics Cardiology  Procedures:  TTE:   Antimicrobials:  Perioperative cefazolin   Subjective: Patient seen examined bedside, resting comfortably.  Lying in bed.  Just returned from the OR s/p cephalic medullary nailing of right hip fracture.  Multiple family members present.  Patient currently pain-free.  Awaiting therapy evaluation, but anticipate need of SNF placement.  No other questions or concerns at this time.  Denies headache, no dizziness, no chest pain, no shortness of breath, no abdominal pain, no fever, no nausea/vomiting/diarrhea. No acute events overnight per nursing staff.  Objective: Vitals:   05/28/22 0905 05/28/22 0920 05/28/22 0935 05/28/22 1003  BP: (!) 109/92 (!) 138/47 (!) 115/43 (!) 125/47  Pulse: 77 80 76 78  Resp: '10 13 15 15  '$ Temp: 98.6 F (37 C)  98.5 F (36.9 C) 97.8 F (36.6 C)  TempSrc:      SpO2: 97% 97% 96% 96%  Weight:      Height:        Intake/Output Summary (Last 24 hours) at 05/28/2022 1135 Last data filed at 05/28/2022 0857 Gross per 24 hour  Intake 1090 ml  Output 1475 ml   Net -385 ml   Filed Weights   05/26/22 2031 05/27/22 0216  Weight: 66.2 kg 60.3 kg    Examination:  Physical Exam: GEN: NAD, alert and oriented to place (hospital), and clearly identifies her daughter in the room, but not oriented to time HEENT: NCAT, PERRL, EOMI, sclera clear, MMM PULM: CTAB w/o wheezes/crackles, normal respiratory effort, on room air CV: RRR w/o M/G/R GI: abd soft, NTND, NABS, no R/G/M MSK: no peripheral edema, right lower extremity with surgical dressing in place, clean/dry/intact, no significant ecchymosis, no erythema/fluctuance NEURO: CN II-XII intact, no focal deficits, sensation to light touch intact PSYCH: normal mood/affect Integumentary: dry/intact, no rashes or wounds    Data Reviewed: I have personally reviewed following labs and imaging studies  CBC: Recent Labs  Lab 05/23/22 2023 05/23/22 2031 05/26/22 2025 05/26/22 2037 05/28/22 0446  WBC 7.9  --  10.2  --  11.2*  NEUTROABS 4.7  --   --   --   --   HGB 14.3 13.9 14.5 14.3 12.5  HCT 44.5 41.0 43.1 42.0 37.3  MCV 91.9  --  89.0  --  89.2  PLT 198  --  198  --  0000000   Basic Metabolic Panel: Recent Labs  Lab 05/23/22 2023 05/23/22 2031 05/26/22 2025 05/26/22 2037 05/26/22 2233 05/28/22 0446  NA 140 140 139 140  --  136  K 3.7 3.7 4.0 4.1  --  4.3  CL 105 106 102 105  --  99  CO2 23  --  24  --   --  25  GLUCOSE 99 99 152* 147*  --  138*  BUN 27* 27* 26* 26*  --  29*  CREATININE 0.80 0.70 0.78 0.70  --  0.85  CALCIUM 9.1  --  9.5  --   --  8.7*  MG  --   --   --   --  1.9 2.0  PHOS  --   --   --   --  3.4  --    GFR: Estimated Creatinine Clearance: 47.7 mL/min (by C-G formula based on SCr of 0.85 mg/dL). Liver Function Tests: Recent Labs  Lab 05/23/22 2023 05/26/22 2025  AST 31 28  ALT 35 30  ALKPHOS 72 79  BILITOT 0.5 0.5  PROT 6.7 6.7  ALBUMIN 3.6 3.7   No results for input(s): "LIPASE", "AMYLASE" in the last 168 hours. No results for input(s): "AMMONIA" in the  last 168 hours. Coagulation Profile: Recent Labs  Lab 05/23/22 2023 05/26/22 2025  INR 1.1 1.1   Cardiac Enzymes: Recent Labs  Lab 05/26/22 2025  CKTOTAL 43   BNP (last 3 results) No results for input(s): "PROBNP" in the last 8760 hours. HbA1C: Recent Labs    05/26/22 2320  HGBA1C 9.8*   CBG: Recent Labs  Lab 05/27/22 2045 05/28/22 0009 05/28/22 0409 05/28/22 0720 05/28/22 1009  GLUCAP 257* 145* 128* 141* 170*   Lipid Profile: No results for input(s): "CHOL", "HDL", "LDLCALC", "TRIG", "CHOLHDL", "LDLDIRECT" in the last 72 hours. Thyroid Function Tests: Recent Labs    05/26/22 2320  TSH 2.375   Anemia Panel: No results for input(s): "VITAMINB12", "FOLATE", "FERRITIN", "TIBC", "IRON", "RETICCTPCT" in the last 72 hours. Sepsis Labs: Recent Labs  Lab 05/26/22 2025 05/26/22 2320 05/27/22 0115  LATICACIDVEN 2.1* 1.5 0.9    Recent Results (from the past 240 hour(s))  Surgical pcr screen     Status: Abnormal   Collection Time: 05/28/22  7:16 AM   Specimen: Nasal Mucosa; Nasal Swab  Result Value Ref Range Status   MRSA, PCR NEGATIVE NEGATIVE Final   Staphylococcus aureus POSITIVE (A) NEGATIVE Final    Comment: (NOTE) The Xpert SA Assay (FDA approved for NASAL specimens in patients 31 years of age and older), is one component of a comprehensive surveillance program. It is not intended to diagnose infection nor to guide or monitor treatment. Performed at [facility], [place] [address], [place], [place] [address]          Radiology Studies: DG HIP UNILAT WITH PELVIS 2-3 VIEWS RIGHT  Result Date: 05/28/2022 CLINICAL DATA:  Status post ORIF of proximal right femur. EXAM: DG HIP (WITH OR WITHOUT PELVIS) 2-3V RIGHT COMPARISON:  05/26/2022 FINDINGS: Six images obtained portably in the operating room via C-arm radiography show open reduction and internal fixation of the intertrochanteric fracture of the proximal right femur. Placement of IM nail and hip  screws noted. On the final image the hardware components and fracture fragments are in anatomic alignment. IMPRESSION: Status post ORIF of intertrochanteric fracture of the proximal right femur. Electronically Signed   By: Kerby Moors M.D.   On: 05/28/2022 10:45   DG C-Arm 1-60 Min-No Report  Result Date: 05/28/2022 Fluoroscopy was utilized by the requesting physician.  No radiographic interpretation.   ECHOCARDIOGRAM COMPLETE  Result Date: 05/27/2022    ECHOCARDIOGRAM REPORT   Patient Name:   Rachel Santos Date of Exam: 05/27/2022 Medical Rec #:  [id number]         Height:       67.0 in Accession #:    [id number]        Weight:       132.9 lb Date of Birth:  1940-09-03        BSA:          1.700 m Patient Age:    75 years          BP:           117/59 mmHg Patient Gender: F                 HR:           73 bpm. Exam Location:  Inpatient Procedure: 2D Echo, 3D Echo, Cardiac Doppler and Color Doppler                                MODIFIED REPORT:     This report was modified by Rudean Haskell MD on 05/27/2022 due to                                 Clarification.  Indications:     Z01.818 Encounter for other preprocedural examination  History:         Patient has prior history of Echocardiogram examinations, most                  recent 01/30/2020. Stroke, Aortic Valve Disease, Mitral Valve                  Disease and Tricuspid valve disease.; Risk Factors:Hypertension                  and Diabetes. Aortic stenosis. Mitral valve repair. Tricuspid                  valve repair. Aortic valve replacement.                  Aortic Valve: 23 mm Sapien prosthetic, stented (TAVR) valve is                  present in the aortic position. Procedure Date: 01/22/2019.                  Mitral Valve: 26 mm Memo prosthetic annuloplasty ring valve is                  present in the mitral position. Procedure Date: 10/11/2012.  Sonographer:     Roseanna Rainbow RDCS Referring Phys:  [place] Diagnosing Phys:  Rudean Haskell MD IMPRESSIONS  1. Circumferential extent of PVR (10_20%), location ~ 12 o'clock on PSAX (images 30-31). Jet extent 0.75 cm of LVOT (image 68). In the PSAX image series regurgitation looks severe and central. The aortic valve has been repaired/replaced. Aortic valve regurgitation is moderate to severe. There is a 23 mm Sapien prosthetic (TAVR) valve present in the aortic position. Procedure Date: 01/22/2019. Aortic valve mean gradient measures 13.0 mmHg.  2. The mitral valve has been repaired/replaced. Trivial mitral valve regurgitation. Mild mitral stenosis. The mean mitral valve gradient is 4.0 mmHg with average heart rate of 76 bpm. There is a 26 mm Memo prosthetic annuloplasty ring present in the mitral position. Procedure Date: 10/11/2012.  3. The tricuspid valve is status post repair on  05/27/2013 with a 35m EMC3 annuloplasty ring.  4. Left ventricular ejection fraction, by estimation, is 65 to 70%. Left ventricular ejection fraction by 3D volume is 70 %. The left ventricle has normal function. The left ventricle has no regional wall motion abnormalities. There is moderate concentric left ventricular hypertrophy. Left ventricular diastolic parameters are indeterminate.  5. Right ventricular systolic function is hyperdynamic. The right ventricular size is normal. Comparison(s): There appears to have been central and paravalvular regurgitation in 2021, but regurgitation has increased from prior. Conclusion(s)/Recommendation(s): Given multiple prosthetic valves, recommend TEE for assessment of prosthetic valve function with a focus on aortic valve regurgitation. FINDINGS  Left Ventricle: Left ventricular ejection fraction, by estimation, is 65 to 70%. Left ventricular ejection fraction by 3D volume is 70 %. The left ventricle has normal function. The left ventricle has no regional wall motion abnormalities. The left ventricular internal cavity size was small. There is moderate concentric left  ventricular hypertrophy. Left ventricular diastolic parameters are indeterminate. Right Ventricle: The right ventricular size is normal. Right vetricular wall thickness was not well visualized. Right ventricular systolic function is hyperdynamic. Left Atrium: Left atrial size was normal in size. Right Atrium: Right atrial size was normal in size. Pericardium: There is no evidence of pericardial effusion. Mitral Valve: 2D MVA 2.24 cm2. The mitral valve has been repaired/replaced. Trivial mitral valve regurgitation. There is a 26 mm Memo prosthetic annuloplasty ring present in the mitral position. Procedure Date: 10/11/2012. Mild mitral valve stenosis. MV peak gradient, 9.7 mmHg. The mean mitral valve gradient is 4.0 mmHg with average heart rate of 76 bpm. Tricuspid Valve: Tricuspid Valve mean gradient 1 mm Hg at a heart rate of 79 bpm. The tricuspid valve is has been repaired/replaced. Tricuspid valve regurgitation is not demonstrated. The tricuspid valve is status post repair with an annuloplasty ring. Aortic Valve: Circumferential extent of PVR (10_20%), location 11- o'clock on PSAX (images 30-31). Jet extent 0.75 cm of LVOT (image 68). In the PSAX image series regurgitation looks severe and central. The aortic valve has been repaired/replaced. Aortic  valve regurgitation is moderate to severe. Aortic regurgitation PHT measures 182 msec. Aortic valve mean gradient measures 13.0 mmHg. Aortic valve peak gradient measures 24.8 mmHg. Aortic valve area, by VTI measures 2.40 cm. There is a 23 mm Sapien prosthetic, stented (TAVR) valve present in the aortic position. Procedure Date: 01/22/2019. Pulmonic Valve: The pulmonic valve was not well visualized. Pulmonic valve regurgitation is trivial. No evidence of pulmonic stenosis. Aorta: The aortic root and ascending aorta are structurally normal, with no evidence of dilitation. IAS/Shunts: No atrial level shunt detected by color flow Doppler.  LEFT VENTRICLE PLAX 2D LVIDd:          3.80 cm         Diastology LVIDs:         1.80 cm         LV e' medial:    4.79 cm/s LV PW:         1.10 cm         LV E/e' medial:  29.2 LV IVS:        1.30 cm         LV e' lateral:   7.40 cm/s LVOT diam:     2.20 cm         LV E/e' lateral: 18.9 LV SV:         101 LV SV Index:   59 LVOT Area:     3.80 cm  3D Volume EF                                LV 3D EF:    Left                                             ventricul LV Volumes (MOD)                            ar LV vol d, MOD    71.3 ml                    ejection A2C:                                        fraction LV vol d, MOD    51.5 ml                    by 3D A4C:                                        volume is LV vol s, MOD    17.8 ml                    70 %. A2C: LV vol s, MOD    12.6 ml A4C:                           3D Volume EF: LV SV MOD A2C:   53.5 ml       3D EF:        70 % LV SV MOD A4C:   51.5 ml       LV EDV:       105 ml LV SV MOD BP:    47.0 ml       LV ESV:       31 ml                                LV SV:        74 ml RIGHT VENTRICLE             IVC RV S prime:     14.60 cm/s  IVC diam: 1.60 cm TAPSE (M-mode): 1.1 cm LEFT ATRIUM             Index        RIGHT ATRIUM           Index LA diam:        3.20 cm 1.88 cm/m   RA Area:     15.10 cm LA Vol (A2C):   75.1 ml 44.18 ml/m  RA Volume:   37.20 ml  21.88 ml/m LA Vol (A4C):   39.8 ml 23.41 ml/m LA Biplane Vol: 54.2 ml 31.88 ml/m  AORTIC VALVE AV Area (Vmax):    2.49 cm AV Area (Vmean):   2.42 cm AV Area (VTI):     2.40 cm AV Vmax:  249.00 cm/s AV Vmean:          161.500 cm/s AV VTI:            0.421 m AV Peak Grad:      24.8 mmHg AV Mean Grad:      13.0 mmHg LVOT Vmax:         163.00 cm/s LVOT Vmean:        103.000 cm/s LVOT VTI:          0.266 m LVOT/AV VTI ratio: 0.63 AI PHT:            182 msec  AORTA Ao Root diam: 3.00 cm Ao Asc diam:  3.50 cm MITRAL VALVE MV Area (PHT): 2.91 cm     SHUNTS MV Area VTI:   2.11 cm     Systemic VTI:  0.27 m MV Peak grad:   9.7 mmHg     Systemic Diam: 2.20 cm MV Mean grad:  4.0 mmHg MV Vmax:       1.56 m/s MV Vmean:      97.5 cm/s MV Decel Time: 261 msec MV E velocity: 140.00 cm/s MV A velocity: 91.60 cm/s MV E/A ratio:  1.53 Rudean Haskell MD Electronically signed by Rudean Haskell MD Signature Date/Time: 05/27/2022/1:57:46 PM    Final (Updated)    CT CERVICAL SPINE WO CONTRAST  Result Date: 05/26/2022 CLINICAL DATA:  Fall.  Polytrauma, blunt EXAM: CT CERVICAL SPINE WITHOUT CONTRAST TECHNIQUE: Multidetector CT imaging of the cervical spine was performed without intravenous contrast. Multiplanar CT image reconstructions were also generated. RADIATION DOSE REDUCTION: This exam was performed according to the departmental dose-optimization program which includes automated exposure control, adjustment of the mA and/or kV according to patient size and/or use of iterative reconstruction technique. COMPARISON:  CTA head neck 01/13/2021 FINDINGS: Alignment: Normal. Skull base and vertebrae: Craniocervical alignment is normal. The atlantodental interval is not widened. Degenerative changes are seen surrounding the odontoid process without significant associated canal stenosis. No acute fracture of the cervical spine. Vertebral body height is preserved. Soft tissues and spinal canal: No prevertebral fluid or swelling. No visible canal hematoma. Disc levels: There is diffuse intervertebral disc space narrowing, endplate remodeling, disc calcification throughout the cervical spine in keeping with changes of advanced degenerative disc disease. The spinal canal is widely patent. Multilevel uncovertebral and facet arthrosis results in multilevel moderate to severe neuroforaminal narrowing, most severe on the right at C5-6. Upper chest: Negative. Other: 18 mm left thyroid nodule, stable since prior examination though mildly enlarged since remote prior examination of 09/27/2012. IMPRESSION: 1. No acute fracture or listhesis of the  cervical spine. 2. 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 3. Stable left thyroid nodule. Recommend thyroid US (ref: J Am Coll Radiol. 2015 Feb;12(2): 143-50). Electronically Signed   By: Fidela Salisbury M.D.   On: 05/26/2022 21:24   CT HEAD WO CONTRAST  Result Date: 05/26/2022 CLINICAL DATA:  Trauma EXAM: CT HEAD WITHOUT CONTRAST TECHNIQUE: Contiguous axial images were obtained from the base of the skull through the vertex without intravenous contrast. RADIATION DOSE REDUCTION: This exam was performed according to the departmental dose-optimization program which includes automated exposure control, adjustment of the mA and/or kV according to patient size and/or use of iterative reconstruction technique. COMPARISON:  Head CT 05/23/2022 FINDINGS: Brain: No evidence of acute infarction, hemorrhage, hydrocephalus, extra-axial collection or mass lesion/mass effect. Again seen is mild  diffuse atrophy. Vascular: Atherosclerotic calcifications are present within the cavernous internal carotid arteries. Skull: Normal. Negative for fracture or focal lesion. Sinuses/Orbits: No acute finding. Other: None. IMPRESSION: 1. No acute intracranial process. 2. Mild diffuse atrophy. Electronically Signed   By: Ronney Asters M.D.   On: 05/26/2022 21:16   DG Knee Right Port  Result Date: 05/26/2022 CLINICAL DATA:  Blunt trauma, fall. EXAM: PORTABLE RIGHT KNEE - 1-2 VIEW COMPARISON:  None Available. FINDINGS: No acute fracture or dislocation. Severe tricompartmental degenerative changes are noted at the knee. There is a small suprapatellar joint effusion containing coarse calcifications, likely loose bodies. Vascular calcifications and vascular stent are noted in the soft tissues. IMPRESSION: 1. No acute fracture or dislocation. 2. Severe tricompartmental degenerative changes. 3. Small suprapatellar  joint effusion with loose bodies. Electronically Signed   By: Brett Fairy M.D.   On: 05/26/2022 21:00   DG Hip Unilat W or Wo Pelvis 2-3 Views Right  Result Date: 05/26/2022 CLINICAL DATA:  Trauma, fall. EXAM: DG HIP (WITH OR WITHOUT PELVIS) 2-3V RIGHT COMPARISON:  12/19/2021. FINDINGS: There is a mildly displaced and comminuted intertrochanteric fracture of the proximal right femur. There are old fractures of the superior pubic ramus on the right and inferior pubic ramus on the left. No dislocation at the hips bilaterally. Mild degenerative changes are noted at the hips bilaterally and lower lumbar spine. Vascular calcifications and stent are noted in the right lower extremity. IMPRESSION: 1. Mildly displaced and comminuted intertrochanteric fracture of the right hip. 2. Old fractures of the superior pubic ramus on the right and inferior pubic ramus on the left. Electronically Signed   By: Brett Fairy M.D.   On: 05/26/2022 20:58   DG Chest Port 1 View  Result Date: 05/26/2022 CLINICAL DATA:  Level 2 trauma, fall. EXAM: PORTABLE CHEST 1 VIEW COMPARISON:  01/22/2019. FINDINGS: Heart is mildly enlarged and the mediastinal contour stable. There is atherosclerotic calcification of the aorta. A TAVR stent, prosthetic cardiac valves and left atrial appendage clip are noted. Lung volumes are low. No consolidation, effusion, or pneumothorax. Sternotomy wires are present over the midline. No acute osseous abnormality. IMPRESSION: No active disease. Electronically Signed   By: Brett Fairy M.D.   On: 05/26/2022 20:55        Scheduled Meds:  atorvastatin  80 mg Oral Daily   docusate sodium  100 mg Oral BID   insulin aspart  0-9 Units Subcutaneous Q4H   insulin detemir  10 Units Subcutaneous BID   venlafaxine XR  225 mg Oral Q breakfast   Continuous Infusions:  sodium chloride 50 mL/hr at 05/28/22 1130    ceFAZolin (ANCEF) IV     methocarbamol (ROBAXIN) IV       LOS: 2 days    Time spent: 48  minutes spent on chart review, discussion with nursing staff, consultants, updating family and interview/physical exam; more than 50% of that time was spent in counseling and/or coordination of care.    Cha Gomillion J [place] DO [facility] Available via Epic secure chat 7am-7pm After these hours, please refer to coverage provider listed on [email or web address] 05/28/2022, 11:35 AM

## 2022-05-29 LAB — CBC
HCT: 26.3 % — ABNORMAL LOW (ref 36.0–46.0)
Hemoglobin: 8.9 g/dL — ABNORMAL LOW (ref 12.0–15.0)
MCH: 30.5 pg (ref 26.0–34.0)
MCHC: 33.8 g/dL (ref 30.0–36.0)
MCV: 90.1 fL (ref 80.0–100.0)
Platelets: 134 10*3/uL — ABNORMAL LOW (ref 150–400)
RBC: 2.92 MIL/uL — ABNORMAL LOW (ref 3.87–5.11)
RDW: 14.2 % (ref 11.5–15.5)
WBC: 10 10*3/uL (ref 4.0–10.5)
nRBC: 0 % (ref 0.0–0.2)

## 2022-05-29 LAB — BASIC METABOLIC PANEL
Anion gap: 10 (ref 5–15)
BUN: 36 mg/dL — ABNORMAL HIGH (ref 8–23)
CO2: 22 mmol/L (ref 22–32)
Calcium: 8.1 mg/dL — ABNORMAL LOW (ref 8.9–10.3)
Chloride: 104 mmol/L (ref 98–111)
Creatinine, Ser: 0.84 mg/dL (ref 0.44–1.00)
GFR, Estimated: >60 mL/min (ref >60)
Glucose, Bld: 196 mg/dL — ABNORMAL HIGH (ref 70–99)
Potassium: 4 mmol/L (ref 3.5–5.1)
Sodium: 136 mmol/L (ref 135–145)

## 2022-05-29 LAB — GLUCOSE, CAPILLARY
Glucose-Capillary: 168 mg/dL — ABNORMAL HIGH (ref 70–99)
Glucose-Capillary: 188 mg/dL — ABNORMAL HIGH (ref 70–99)
Glucose-Capillary: 200 mg/dL — ABNORMAL HIGH (ref 70–99)
Glucose-Capillary: 236 mg/dL — ABNORMAL HIGH (ref 70–99)
Glucose-Capillary: 250 mg/dL — ABNORMAL HIGH (ref 70–99)
Glucose-Capillary: 253 mg/dL — ABNORMAL HIGH (ref 70–99)

## 2022-05-29 LAB — ECHOCARDIOGRAM COMPLETE
AR max vel: 2.49 cm2
AV Area VTI: 2.4 cm2
AV Area mean vel: 2.42 cm2
AV Mean grad: 13 mmHg
AV Peak grad: 24.8 mmHg
Ao pk vel: 2.49 m/s
Area-P 1/2: 2.91 cm2
Calc EF: 73.2 %
Height: 67 in
MV VTI: 2.11 cm2
P 1/2 time: 182 msec
S' Lateral: 1.8 cm
Single Plane A2C EF: 75.1 %
Single Plane A4C EF: 75.5 %
Weight: 2127 oz

## 2022-05-29 MED ORDER — VITAMIN B-12 1000 MCG PO TABS
500.0000 ug | ORAL_TABLET | ORAL | Status: DC
  Administered 2022-05-30: 500 ug via ORAL
  Filled 2022-05-29 (×2): qty 1

## 2022-05-29 MED ORDER — DAPAGLIFLOZIN PROPANEDIOL 10 MG PO TABS
10.0000 mg | ORAL_TABLET | Freq: Every day | ORAL | Status: DC
  Administered 2022-05-29 – 2022-06-02 (×5): 10 mg via ORAL
  Filled 2022-05-29 (×5): qty 1

## 2022-05-29 MED ORDER — ADULT MULTIVITAMIN W/MINERALS CH
1.0000 | ORAL_TABLET | Freq: Every day | ORAL | Status: DC
  Administered 2022-05-29 – 2022-06-02 (×5): 1 via ORAL
  Filled 2022-05-29 (×8): qty 1

## 2022-05-29 MED ORDER — ASPIRIN 81 MG PO TBEC
81.0000 mg | DELAYED_RELEASE_TABLET | Freq: Every day | ORAL | Status: DC
  Administered 2022-05-29 – 2022-06-02 (×5): 81 mg via ORAL
  Filled 2022-05-29 (×5): qty 1

## 2022-05-29 MED ORDER — APIXABAN 2.5 MG PO TABS
2.5000 mg | ORAL_TABLET | Freq: Two times a day (BID) | ORAL | Status: DC
  Administered 2022-05-29 – 2022-06-02 (×9): 2.5 mg via ORAL
  Filled 2022-05-29 (×9): qty 1

## 2022-05-29 MED ORDER — PANTOPRAZOLE SODIUM 40 MG PO TBEC
80.0000 mg | DELAYED_RELEASE_TABLET | Freq: Every day | ORAL | Status: DC
  Administered 2022-05-29 – 2022-06-02 (×5): 80 mg via ORAL
  Filled 2022-05-29 (×5): qty 2

## 2022-05-29 MED ORDER — ACETAMINOPHEN 325 MG PO TABS
325.0000 mg | ORAL_TABLET | Freq: Four times a day (QID) | ORAL | Status: DC | PRN
  Administered 2022-06-01 – 2022-06-02 (×2): 650 mg via ORAL
  Filled 2022-05-29 (×3): qty 2

## 2022-05-29 MED ORDER — INSULIN ASPART 100 UNIT/ML IJ SOLN
0.0000 [IU] | Freq: Three times a day (TID) | INTRAMUSCULAR | Status: DC
  Administered 2022-05-29: 3 [IU] via SUBCUTANEOUS
  Administered 2022-05-30: 2 [IU] via SUBCUTANEOUS
  Administered 2022-05-30 (×2): 5 [IU] via SUBCUTANEOUS
  Administered 2022-05-31: 1 [IU] via SUBCUTANEOUS

## 2022-05-29 NOTE — Evaluation (Signed)
Occupational Therapy Evaluation Patient Details Name: Rachel Santos MRN: [id number] DOB: Jan 15, 1941 Today's Date: 05/29/2022   History of Present Illness 82 yo female presenting to ED on 2/29 with R hip pain after fall at home. S/p Cephalomedullary nailing of right intertrochanteric femur fracture on 3/2. PMH including paroxysmal atrial fibrillation on Eliquis, severe aortic stenosis s/p TAVR, mitral/tricuspid valve disease s/p repair, CAD, history of CVA with severe left MCA stenosis, CKD stage IIIa, type 2 diabetes mellitus, essential hypertension, hyperlipidemia, depression/anxiety, dementia   Clinical Impression   PTA, pt was living alone and performing BADLs and simple IADLs; daughter visits regularly to assist with IADLs.  Pt currently requiring Max A for LB ADLs and Mod A +2 for functional transfers. Once in recliner, pt with pale coloring and decreased responsiveness. BP soft (see general comments); notified RN and returned pt back to supine in bed. Pt would benefit from further acute OT to facilitate safe dc. Recommend dc to SNF for further OT to optimize safety, independence with ADLs, and return to PLOF.      Recommendations for follow up therapy are one component of a multi-disciplinary discharge planning process, led by the attending physician.  Recommendations may be updated based on patient status, additional functional criteria and insurance authorization.   Follow Up Recommendations  Skilled nursing-short term rehab (<3 hours/day)     Assistance Recommended at Discharge Frequent or constant Supervision/Assistance  Patient can return home with the following Two people to help with walking and/or transfers;Two people to help with bathing/dressing/bathroom    Functional Status Assessment  Patient has had a recent decline in their functional status and demonstrates the ability to make significant improvements in function in a reasonable and predictable amount of time.   Equipment Recommendations  BSC/3in1    Recommendations for Other Services PT consult     Precautions / Restrictions Precautions Precautions: Fall Precaution Comments: Monitor BP Restrictions Weight Bearing Restrictions: No      Mobility Bed Mobility Overal bed mobility: Needs Assistance Bed Mobility: Supine to Sit, Sit to Supine     Supine to sit: Max assist, +2 for physical assistance Sit to supine: Total assist, +2 for physical assistance   General bed mobility comments: Max A for managing trunk and LEs. Total A to slide back to bed due to BP    Transfers Overall transfer level: Needs assistance Equipment used: Rolling walker (2 wheels) Transfers: Bed to chair/wheelchair/BSC, Sit to/from Stand Sit to Stand: Mod assist, +2 physical assistance Stand pivot transfers: Mod assist, +2 physical assistance         General transfer comment: Mod A +2 for power up and pivot to recliner      Balance Overall balance assessment: Needs assistance Sitting-balance support: No upper extremity supported, Feet supported Sitting balance-Leahy Scale: Fair     Standing balance support: During functional activity, Bilateral upper extremity supported Standing balance-Leahy Scale: Poor                             ADL either performed or assessed with clinical judgement   ADL Overall ADL's : Needs assistance/impaired Eating/Feeding: Set up;Bed level   Grooming: Set up;Bed level   Upper Body Bathing: Moderate assistance;Sitting   Lower Body Bathing: Maximal assistance;Sit to/from stand   Upper Body Dressing : Moderate assistance;Sitting   Lower Body Dressing: Maximal assistance;Sit to/from stand   Toilet Transfer: Moderate assistance;+2 for physical assistance;Stand-pivot;Rolling walker (2 wheels) (simulated to recliner)  Functional mobility during ADLs: Moderate assistance;+2 for physical assistance (stand pivot) General ADL Comments: Pt presenting  with decreased balance, strength, and activity tolerance.     Vision         Perception     Praxis      Pertinent Vitals/Pain Pain Assessment Pain Assessment: Faces Faces Pain Scale: Hurts even more Pain Location: RLE Pain Descriptors / Indicators: Discomfort, Grimacing Pain Intervention(s): Monitored during session, Repositioned, Premedicated before session     Hand Dominance Right   Extremity/Trunk Assessment Upper Extremity Assessment Upper Extremity Assessment: Generalized weakness   Lower Extremity Assessment Lower Extremity Assessment: Defer to PT evaluation   Cervical / Trunk Assessment Cervical / Trunk Assessment: Kyphotic   Communication Communication Communication: HOH   Cognition Arousal/Alertness: Awake/alert Behavior During Therapy: Flat affect Overall Cognitive Status: History of cognitive impairments - at baseline                                 General Comments: Dementia at baseline     General Comments  sitting 117/55 (73); pulse 85. Reclined 133/44 (65); supine in bed 123/32 (58) and 126/33 (59); trendelenburg 133/39 (66)    Exercises     Shoulder Instructions      Home Living Family/patient expects to be discharged to:: Private residence Living Arrangements: Alone Available Help at Discharge: Family;Available 24 hours/day Type of Home: Apartment (first floor) Home Access: Level entry     Home Layout: One level     Bathroom Shower/Tub: Teacher, early years/pre: Standard     Home Equipment: Conservation officer, nature (2 wheels);Cane - quad;Shower seat;Hand held shower head          Prior Functioning/Environment Prior Level of Function : Needs assist       Physical Assist : ADLs (physical);Mobility (physical)     Mobility Comments: Using walker; but doesnt always use ADLs Comments: ADLs and simple IADLs; daughter will come and assist with initating bathes and IADLs.        OT Problem List: Decreased  strength;Decreased range of motion;Decreased activity tolerance;Impaired balance (sitting and/or standing);Decreased knowledge of use of DME or AE;Decreased knowledge of precautions      OT Treatment/Interventions: Self-care/ADL training;Therapeutic exercise;Energy conservation;DME and/or AE instruction;Therapeutic activities;Patient/family education    OT Goals(Current goals can be found in the care plan section) Acute Rehab OT Goals Patient Stated Goal: Get stronger OT Goal Formulation: With patient/family Time For Goal Achievement: 06/12/22 Potential to Achieve Goals: Good  OT Frequency: Min 2X/week    Co-evaluation PT/OT/SLP Co-Evaluation/Treatment: Yes Reason for Co-Treatment: For patient/therapist safety;To address functional/ADL transfers   OT goals addressed during session: ADL's and self-care      AM-PAC OT "6 Clicks" Daily Activity     Outcome Measure Help from another person eating meals?: A Little Help from another person taking care of personal grooming?: A Little Help from another person toileting, which includes using toliet, bedpan, or urinal?: A Lot Help from another person bathing (including washing, rinsing, drying)?: A Lot Help from another person to put on and taking off regular upper body clothing?: A Lot Help from another person to put on and taking off regular lower body clothing?: A Lot 6 Click Score: 14   End of Session Equipment Utilized During Treatment: Rolling walker (2 wheels);Gait belt;Oxygen (2L) Nurse Communication: Mobility status  Activity Tolerance: Patient tolerated treatment well Patient left: in bed;with call bell/phone within reach;with bed alarm  set;with family/visitor present;with nursing/sitter in room  OT Visit Diagnosis: Unsteadiness on feet (R26.81);Other abnormalities of gait and mobility (R26.89);Muscle weakness (generalized) (M62.81);Pain                Time: [id number] OT Time Calculation (min): 40 min Charges:  OT General  Charges $OT Visit: 1 Visit OT Evaluation $OT Eval Moderate Complexity: 1 Mod OT Treatments $Self Care/Home Management : 8-22 mins  Daelin Haste MSOT, OTR/L Acute Rehab Office: [place] 05/29/2022, 4:47 PM

## 2022-05-29 NOTE — Progress Notes (Signed)
Rounding Note    Patient Name: Rachel Santos Date of Encounter: 05/29/2022  [place] Cardiologist: Lauree Chandler, MD   Subjective   Overall well after surgery. Daughter at bedside.  Inpatient Medications    Scheduled Meds:  apixaban  2.5 mg Oral BID   atorvastatin  80 mg Oral Daily   Chlorhexidine Gluconate Cloth  6 each Topical Q0600   docusate sodium  100 mg Oral BID   insulin aspart  0-9 Units Subcutaneous Q4H   insulin detemir  10 Units Subcutaneous BID   mupirocin ointment  1 Application Nasal BID   venlafaxine XR  225 mg Oral Q breakfast   Continuous Infusions:  sodium chloride 50 mL/hr at 05/29/22 0733   methocarbamol (ROBAXIN) IV     PRN Meds: acetaminophen, HYDROcodone-acetaminophen, methocarbamol **OR** methocarbamol (ROBAXIN) IV, metoCLOPramide **OR** metoCLOPramide (REGLAN) injection, morphine injection, ondansetron **OR** ondansetron (ZOFRAN) IV, polyethylene glycol   Vital Signs    Vitals:   05/28/22 2112 05/29/22 0434 05/29/22 0435 05/29/22 0749  BP: (!) 133/56 (!) 130/40 (!) 130/40 (!) 125/23  Pulse: 81 71 69 70  Resp:    15  Temp: 99.9 F (37.7 C)  99.8 F (37.7 C) 99.4 F (37.4 C)  TempSrc: Oral  Oral Oral  SpO2: 100% 99% 98% 100%  Weight:      Height:        Intake/Output Summary (Last 24 hours) at 05/29/2022 0949 Last data filed at 05/29/2022 0415 Gross per 24 hour  Intake 1347.2 ml  Output 100 ml  Net 1247.2 ml      05/27/2022    2:16 AM 05/26/2022    8:31 PM 05/23/2022    7:40 PM  Last 3 Weights  Weight (lbs) 132 lb 15 oz 145 lb 15.1 oz 146 lb  Weight (kg) 60.3 kg 66.2 kg 66.225 kg      Telemetry    SR with short runs of SVT/AT - Personally Reviewed  ECG    No new - Personally Reviewed  Physical Exam   GEN: No acute distress.   Neck: No JVD Cardiac: RRR, 2/6 systolic murmur, 1/6 diastolic murmur USB. 2/6 diastolic murmur at apex. Respiratory: Clear to auscultation bilaterally. GI: Soft, nontender,  non-distended  MS: No edema; No deformity. Neuro:  Nonfocal  Psych: Normal affect   Labs    High Sensitivity Troponin:   Recent Labs  Lab 05/26/22 2025 05/26/22 2233  TROPONINIHS 10 10     Chemistry Recent Labs  Lab 05/23/22 2023 05/23/22 2031 05/26/22 2025 05/26/22 2037 05/26/22 2233 05/28/22 0446 05/29/22 0443  NA 140   < > 139 140  --  136 136  K 3.7   < > 4.0 4.1  --  4.3 4.0  CL 105   < > 102 105  --  99 104  CO2 23  --  24  --   --  25 22  GLUCOSE 99   < > 152* 147*  --  138* 196*  BUN 27*   < > 26* 26*  --  29* 36*  CREATININE 0.80   < > 0.78 0.70  --  0.85 0.84  CALCIUM 9.1  --  9.5  --   --  8.7* 8.1*  MG  --   --   --   --  1.9 2.0  --   PROT 6.7  --  6.7  --   --   --   --   ALBUMIN 3.6  --  3.7  --   --   --   --   AST 31  --  28  --   --   --   --   ALT 35  --  30  --   --   --   --   ALKPHOS 72  --  79  --   --   --   --   BILITOT 0.5  --  0.5  --   --   --   --   GFRNONAA >60  --  >60  --   --  >60 >60  ANIONGAP 12  --  13  --   --  12 10   < > = values in this interval not displayed.    Lipids No results for input(s): "CHOL", "TRIG", "HDL", "LABVLDL", "LDLCALC", "CHOLHDL" in the last 168 hours.  Hematology Recent Labs  Lab 05/26/22 2025 05/26/22 2037 05/28/22 0446 05/29/22 0443  WBC 10.2  --  11.2* 10.0  RBC 4.84  --  4.18 2.92*  HGB 14.5 14.3 12.5 8.9*  HCT 43.1 42.0 37.3 26.3*  MCV 89.0  --  89.2 90.1  MCH 30.0  --  29.9 30.5  MCHC 33.6  --  33.5 33.8  RDW 13.5  --  14.1 14.2  PLT 198  --  160 134*   Thyroid  Recent Labs  Lab 05/26/22 2320  TSH 2.375    BNPNo results for input(s): "BNP", "PROBNP" in the last 168 hours.  DDimer No results for input(s): "DDIMER" in the last 168 hours.   Radiology    DG HIP PORT UNILAT W OR W/O PELVIS 1V RIGHT  Result Date: 05/28/2022 CLINICAL DATA:  Fracture, postop. EXAM: DG HIP (WITH OR WITHOUT PELVIS) 1V PORT RIGHT COMPARISON:  Preoperative radiographs. FINDINGS: Intramedullary nail with  trans trochanteric and distal locking screw fixation traverse proximal femur fracture. Improved fracture alignment from preoperative imaging. Recent postsurgical change includes air and edema in the soft tissues. Vascular stents are seen. Remote pubic rami fractures. IMPRESSION: ORIF right proximal femur fracture. No immediate postoperative complication. Electronically Signed   By: Keith Rake M.D.   On: 05/28/2022 11:42   DG HIP UNILAT WITH PELVIS 2-3 VIEWS RIGHT  Result Date: 05/28/2022 CLINICAL DATA:  Status post ORIF of proximal right femur. EXAM: DG HIP (WITH OR WITHOUT PELVIS) 2-3V RIGHT COMPARISON:  05/26/2022 FINDINGS: Six images obtained portably in the operating room via C-arm radiography show open reduction and internal fixation of the intertrochanteric fracture of the proximal right femur. Placement of IM nail and hip screws noted. On the final image the hardware components and fracture fragments are in anatomic alignment. IMPRESSION: Status post ORIF of intertrochanteric fracture of the proximal right femur. Electronically Signed   By: Kerby Moors M.D.   On: 05/28/2022 10:45   DG C-Arm 1-60 Min-No Report  Result Date: 05/28/2022 Fluoroscopy was utilized by the requesting physician.  No radiographic interpretation.   ECHOCARDIOGRAM COMPLETE  Result Date: 05/27/2022    ECHOCARDIOGRAM REPORT   Patient Name:   Rachel Santos Date of Exam: 05/27/2022 Medical Rec #:  [id number]         Height:       67.0 in Accession #:    [id number]        Weight:       132.9 lb Date of Birth:  10-31-1940        BSA:          1.700  m Patient Age:    82 years          BP:           117/59 mmHg Patient Gender: F                 HR:           73 bpm. Exam Location:  Inpatient Procedure: 2D Echo, 3D Echo, Cardiac Doppler and Color Doppler                                MODIFIED REPORT:     This report was modified by Rudean Haskell MD on 05/27/2022 due to                                 Clarification.   Indications:     Z01.818 Encounter for other preprocedural examination  History:         Patient has prior history of Echocardiogram examinations, most                  recent 01/30/2020. Stroke, Aortic Valve Disease, Mitral Valve                  Disease and Tricuspid valve disease.; Risk Factors:Hypertension                  and Diabetes. Aortic stenosis. Mitral valve repair. Tricuspid                  valve repair. Aortic valve replacement.                  Aortic Valve: 23 mm Sapien prosthetic, stented (TAVR) valve is                  present in the aortic position. Procedure Date: 01/22/2019.                  Mitral Valve: 26 mm Memo prosthetic annuloplasty ring valve is                  present in the mitral position. Procedure Date: 10/11/2012.  Sonographer:     Roseanna Rainbow RDCS Referring Phys:  [place] Diagnosing Phys: Rudean Haskell MD IMPRESSIONS  1. Circumferential extent of PVR (10_20%), location ~ 12 o'clock on PSAX (images 30-31). Jet extent 0.75 cm of LVOT (image 68). In the PSAX image series regurgitation looks severe and central. The aortic valve has been repaired/replaced. Aortic valve regurgitation is moderate to severe. There is a 23 mm Sapien prosthetic (TAVR) valve present in the aortic position. Procedure Date: 01/22/2019. Aortic valve mean gradient measures 13.0 mmHg.  2. The mitral valve has been repaired/replaced. Trivial mitral valve regurgitation. Mild mitral stenosis. The mean mitral valve gradient is 4.0 mmHg with average heart rate of 76 bpm. There is a 26 mm Memo prosthetic annuloplasty ring present in the mitral position. Procedure Date: 10/11/2012.  3. The tricuspid valve is status post repair on 05/27/2013 with a 27m EMC3 annuloplasty ring.  4. Left ventricular ejection fraction, by estimation, is 65 to 70%. Left ventricular ejection fraction by 3D volume is 70 %. The left ventricle has normal function. The left ventricle has no regional wall motion abnormalities.  There is moderate concentric left ventricular hypertrophy. Left ventricular diastolic parameters are indeterminate.  5. Right ventricular systolic  function is hyperdynamic. The right ventricular size is normal. Comparison(s): There appears to have been central and paravalvular regurgitation in 2021, but regurgitation has increased from prior. Conclusion(s)/Recommendation(s): Given multiple prosthetic valves, recommend TEE for assessment of prosthetic valve function with a focus on aortic valve regurgitation. FINDINGS  Left Ventricle: Left ventricular ejection fraction, by estimation, is 65 to 70%. Left ventricular ejection fraction by 3D volume is 70 %. The left ventricle has normal function. The left ventricle has no regional wall motion abnormalities. The left ventricular internal cavity size was small. There is moderate concentric left ventricular hypertrophy. Left ventricular diastolic parameters are indeterminate. Right Ventricle: The right ventricular size is normal. Right vetricular wall thickness was not well visualized. Right ventricular systolic function is hyperdynamic. Left Atrium: Left atrial size was normal in size. Right Atrium: Right atrial size was normal in size. Pericardium: There is no evidence of pericardial effusion. Mitral Valve: 2D MVA 2.24 cm2. The mitral valve has been repaired/replaced. Trivial mitral valve regurgitation. There is a 26 mm Memo prosthetic annuloplasty ring present in the mitral position. Procedure Date: 10/11/2012. Mild mitral valve stenosis. MV peak gradient, 9.7 mmHg. The mean mitral valve gradient is 4.0 mmHg with average heart rate of 76 bpm. Tricuspid Valve: Tricuspid Valve mean gradient 1 mm Hg at a heart rate of 79 bpm. The tricuspid valve is has been repaired/replaced. Tricuspid valve regurgitation is not demonstrated. The tricuspid valve is status post repair with an annuloplasty ring. Aortic Valve: Circumferential extent of PVR (10_20%), location 11- o'clock on  PSAX (images 30-31). Jet extent 0.75 cm of LVOT (image 68). In the PSAX image series regurgitation looks severe and central. The aortic valve has been repaired/replaced. Aortic  valve regurgitation is moderate to severe. Aortic regurgitation PHT measures 182 msec. Aortic valve mean gradient measures 13.0 mmHg. Aortic valve peak gradient measures 24.8 mmHg. Aortic valve area, by VTI measures 2.40 cm. There is a 23 mm Sapien prosthetic, stented (TAVR) valve present in the aortic position. Procedure Date: 01/22/2019. Pulmonic Valve: The pulmonic valve was not well visualized. Pulmonic valve regurgitation is trivial. No evidence of pulmonic stenosis. Aorta: The aortic root and ascending aorta are structurally normal, with no evidence of dilitation. IAS/Shunts: No atrial level shunt detected by color flow Doppler.  LEFT VENTRICLE PLAX 2D LVIDd:         3.80 cm         Diastology LVIDs:         1.80 cm         LV e' medial:    4.79 cm/s LV PW:         1.10 cm         LV E/e' medial:  29.2 LV IVS:        1.30 cm         LV e' lateral:   7.40 cm/s LVOT diam:     2.20 cm         LV E/e' lateral: 18.9 LV SV:         101 LV SV Index:   59 LVOT Area:     3.80 cm        3D Volume EF                                LV 3D EF:    Left  ventricul LV Volumes (MOD)                            ar LV vol d, MOD    71.3 ml                    ejection A2C:                                        fraction LV vol d, MOD    51.5 ml                    by 3D A4C:                                        volume is LV vol s, MOD    17.8 ml                    70 %. A2C: LV vol s, MOD    12.6 ml A4C:                           3D Volume EF: LV SV MOD A2C:   53.5 ml       3D EF:        70 % LV SV MOD A4C:   51.5 ml       LV EDV:       105 ml LV SV MOD BP:    47.0 ml       LV ESV:       31 ml                                LV SV:        74 ml RIGHT VENTRICLE             IVC RV S prime:     14.60 cm/s  IVC diam:  1.60 cm TAPSE (M-mode): 1.1 cm LEFT ATRIUM             Index        RIGHT ATRIUM           Index LA diam:        3.20 cm 1.88 cm/m   RA Area:     15.10 cm LA Vol (A2C):   75.1 ml 44.18 ml/m  RA Volume:   37.20 ml  21.88 ml/m LA Vol (A4C):   39.8 ml 23.41 ml/m LA Biplane Vol: 54.2 ml 31.88 ml/m  AORTIC VALVE AV Area (Vmax):    2.49 cm AV Area (Vmean):   2.42 cm AV Area (VTI):     2.40 cm AV Vmax:           249.00 cm/s AV Vmean:          161.500 cm/s AV VTI:            0.421 m AV Peak Grad:      24.8 mmHg AV Mean Grad:      13.0 mmHg LVOT Vmax:         163.00 cm/s LVOT Vmean:        103.000 cm/s LVOT  VTI:          0.266 m LVOT/AV VTI ratio: 0.63 AI PHT:            182 msec  AORTA Ao Root diam: 3.00 cm Ao Asc diam:  3.50 cm MITRAL VALVE MV Area (PHT): 2.91 cm     SHUNTS MV Area VTI:   2.11 cm     Systemic VTI:  0.27 m MV Peak grad:  9.7 mmHg     Systemic Diam: 2.20 cm MV Mean grad:  4.0 mmHg MV Vmax:       1.56 m/s MV Vmean:      97.5 cm/s MV Decel Time: 261 msec MV E velocity: 140.00 cm/s MV A velocity: 91.60 cm/s MV E/A ratio:  1.53 Rudean Haskell MD Electronically signed by Rudean Haskell MD Signature Date/Time: 05/27/2022/1:57:46 PM    Final (Updated)     Cardiac Studies   Echocardiogram reviewed - there is mild paravalular regurgitation, and post operative changes of mitral valve repair.  Patient Profile     82 y.o. female with right hip fracture and prior TAVR, surgical mitral valve/tricuspid valve repair and MAZE. Seen for preop eval.   Assessment & Plan    Right hip fracture: s/p Cephalomedullary nailing of right intertrochanteric femur fracture.  2.  History of tricuspid and mitral annuloplasty 2014: Stable on echocardiogram. History of TAVR: On image review, suspect there is mild paravalvular regurgitation, possibly two jets that are anterior and posterior, color Doppler in apical views contaminated with mitral inflow signal, however likely only mild regurgitation and  likely paravalvular. Further workup including TEE unlikely to be required unless further change or clinical symptoms indication. Recommend repeat echocardiogram in 3-6 months or if symptoms change. No symptoms currently.    Nonobstructive CAD: Last cardiac catheterization in September 2020 that showed mild to moderate nonobstructive CAD with 50% mid RCA, 50% RPDA, 30% proximal RCA, 20% mid left circumflex, 40% ostial to proximal LAD and 30% mid LAD lesion.  Denies any recent chest pain.   5. PAF: On Eliquis. H/o Maze in 2014 at the time of mitral and tricuspid annuloplasty. No recurrence since 2014.  Was previously on Xarelto, Xarelto was switched to Eliquis in July 2023 after presented with GI bleed.  Eliquis on hold in anticipation of orthopedic surgery, resume when stable from surgery perspective.   6. History of recurrent CVA: Had cardioembolic CVA after TAVR procedure in 2020, was readmitted in November 2020 at [facility] with recurrent CVA.  Initially placed on antiplatelet on top of Eliquis, however had major GI bleed in July 2023 requiring discontinuation of antiplatelet therapy.  Was restarted on Plavix on top of Eliquis in December 2023 after lower extremity angiography.   7. PAD: Status post right lower extremity angiography and stenting on 03/14/2022.  Was placed on Plavix, Plavix held in anticipation of hip surgery.   No further cardiovascular recommendations inpatient. Will arrange cardiology follow up in 2-4 weeks.      For questions or updates, please contact [place] Please consult [email or web address] for contact info under        Signed, Elouise Munroe, MD  05/29/2022, 9:49 AM

## 2022-05-29 NOTE — Progress Notes (Addendum)
Orthopaedic Trauma Progress Note  SUBJECTIVE: Resting comfortably in bed this morning.  Notes some soreness in the right leg but no significant pain.  Has not required any pain medications since surgery.  No chest pain. No SOB. No nausea/vomiting. No other complaints.  Family member at bedside, notes family's concern regarding stroke risk. Asking if Eliquis can be restarted today.   OBJECTIVE:  Vitals:   05/29/22 0434 05/29/22 0435  BP: (!) 130/40 (!) 130/40  Pulse: 71 69  Resp:    Temp:  99.8 F (37.7 C)  SpO2: 99% 98%    General: Resting comfortably in bed, no acute distress Respiratory: No increased work of breathing.  RLE: Dressings clean, dry, intact.  No significant tenderness with palpation of the hip or throughout the thigh.  Tolerates gentle ankle range of motion.  Able to wiggle toes.  Sensation intact to light touch over all aspects of the foot. + DP pulse  IMAGING: Stable post op imaging.   LABS:  Results for orders placed or performed during the hospital encounter of 05/26/22 (from the past 24 hour(s))  Glucose, capillary     Status: Abnormal   Collection Time: 05/28/22 10:09 AM  Result Value Ref Range   Glucose-Capillary 170 (H) 70 - 99 mg/dL  Glucose, capillary     Status: Abnormal   Collection Time: 05/28/22  4:13 PM  Result Value Ref Range   Glucose-Capillary 274 (H) 70 - 99 mg/dL  Glucose, capillary     Status: Abnormal   Collection Time: 05/28/22  8:39 PM  Result Value Ref Range   Glucose-Capillary 296 (H) 70 - 99 mg/dL  Glucose, capillary     Status: Abnormal   Collection Time: 05/29/22 12:15 AM  Result Value Ref Range   Glucose-Capillary 250 (H) 70 - 99 mg/dL  Glucose, capillary     Status: Abnormal   Collection Time: 05/29/22  4:42 AM  Result Value Ref Range   Glucose-Capillary 188 (H) 70 - 99 mg/dL  Basic metabolic panel     Status: Abnormal   Collection Time: 05/29/22  4:43 AM  Result Value Ref Range   Sodium 136 135 - 145 mmol/L   Potassium 4.0  3.5 - 5.1 mmol/L   Chloride 104 98 - 111 mmol/L   CO2 22 22 - 32 mmol/L   Glucose, Bld 196 (H) 70 - 99 mg/dL   BUN 36 (H) 8 - 23 mg/dL   Creatinine, Ser 0.84 0.44 - 1.00 mg/dL   Calcium 8.1 (L) 8.9 - 10.3 mg/dL   GFR, Estimated >60 >60 mL/min   Anion gap 10 5 - 15  CBC     Status: Abnormal   Collection Time: 05/29/22  4:43 AM  Result Value Ref Range   WBC 10.0 4.0 - 10.5 K/uL   RBC 2.92 (L) 3.87 - 5.11 MIL/uL   Hemoglobin 8.9 (L) 12.0 - 15.0 g/dL   HCT 26.3 (L) 36.0 - 46.0 %   MCV 90.1 80.0 - 100.0 fL   MCH 30.5 26.0 - 34.0 pg   MCHC 33.8 30.0 - 36.0 g/dL   RDW 14.2 11.5 - 15.5 %   Platelets 134 (L) 150 - 400 K/uL   nRBC 0.0 0.0 - 0.2 %    ASSESSMENT: Rachel Santos is a 82 y.o. female, 1 Day Post-Op s/p INTRAMEDULLARY NAILING RIGHT INTERTROCHANTERIC FEMUR FRACTURE  CV/Blood loss: Acute blood loss anemia, Hgb 8.9 this morning. Hemodynamically stable  PLAN: Weightbearing: WBAT RLE ROM: Ok for unrestricted ROM  Incisional and dressing care: Reinforce dressings as needed  Showering: Ok to begin showering/getting incisions wet 05/31/22 Orthopedic device(s): None  Pain management:  1. Tylenol 325-650 mg q 6 hours PRN 2. Robaxin 500 mg q 6 hours PRN 3. Norco 5-325 mg q 4 hours PRN 4. Morphine 0.5 mg q 2 hours PRN VTE prophylaxis:  Restart Eliquis today , SCDs ID:  Ancef 2gm post op Foley/Lines:  No foley, KVO IVFs Impediments to Fracture Healing: Vit D level 45, no additional supplementation needed Dispo: PT/OT eval today, dispo pending. Will likely require SNF.  Eliquis restarted today, continue to monitor CBC closely and transfuse as needed.. Plan to remove dressing RLE 05/30/22  D/C recommendations: - Norco for pain control - Homse dose Eliquis for DVT prophylaxis - No need for additional Vit D supplementation  Follow - up plan: 2 weeks   Contact information:  Katha Hamming MD, Rushie Nyhan PA-C. After hours and holidays please check [email or web address] for group call  information for [facility]   Gwinda Passe, PA-C [phone] (office) [email or web address]

## 2022-05-29 NOTE — Progress Notes (Signed)
ANTICOAGULATION CONSULT NOTE - Follow Up Consult  Pharmacy Consult for Eliquis Indication: atrial fibrillation  Allergies  Allergen Reactions   Buspirone Hcl     lethargic/tired   Metformin And Related Diarrhea   Ozempic (0.25 Or 0.5 Mg-Dose) [Semaglutide(0.25 Or 0.'5mg'$ -Dos)]     Other reaction(s): nausea   Ultram [Tramadol] Nausea And Vomiting   Nickel Rash    Pt unable to wear jewelry made of nickel.   Pneumococcal Vaccines Swelling and Rash    At injection set    Patient Measurements: Height: '5\' 7"'$  (170.2 cm) Weight: 60.3 kg (132 lb 15 oz) IBW/kg (Calculated) : 61.6   Vital Signs: Temp: 99.4 F (37.4 C) (03/03 0749) Temp Source: Oral (03/03 0749) BP: 125/23 (03/03 0749) Pulse Rate: 70 (03/03 0749)  Labs: Recent Labs    05/26/22 2025 05/26/22 2037 05/26/22 2233 05/28/22 0446 05/29/22 0443  HGB 14.5 14.3  --  12.5 8.9*  HCT 43.1 42.0  --  37.3 26.3*  PLT 198  --   --  160 134*  LABPROT 13.9  --   --   --   --   INR 1.1  --   --   --   --   CREATININE 0.78 0.70  --  0.85 0.84  CKTOTAL 43  --   --   --   --   TROPONINIHS 10  --  10  --   --     Estimated Creatinine Clearance: 48.3 mL/min (by C-G formula based on SCr of 0.84 mg/dL).   Medications:  Scheduled:   apixaban  2.5 mg Oral BID   atorvastatin  80 mg Oral Daily   Chlorhexidine Gluconate Cloth  6 each Topical Q0600   docusate sodium  100 mg Oral BID   insulin aspart  0-9 Units Subcutaneous Q4H   insulin detemir  10 Units Subcutaneous BID   mupirocin ointment  1 Application Nasal BID   venlafaxine XR  225 mg Oral Q breakfast    Assessment: 81YOF was on Eliquis 2.'5mg'$  PTA for paroxysmal afib. Today is post-op Day 1 following femur surgery. Hgb slightly low 8.9, plt 134. Given benefits > risks, DO is comfortable with starting Eliquis again today even though Hgb has not hit 9. Dose is appropriate given she meets 2/3 criteria (age and weight ~60kg). Continue to monitor CBC, s/sxs of bleeding.  Goal of  Therapy:  Monitor platelets by anticoagulation protocol: Yes   Plan:  Reinitiate PTA Eliquis 2.'5mg'$  BID.  Leticia Clas, PharmD Candidate 05/29/2022,8:23 AM

## 2022-05-29 NOTE — Evaluation (Signed)
Physical Therapy Evaluation Patient Details Name: Rachel Santos MRN: [id number] DOB: 01/11/1941 Today's Date: 05/29/2022  History of Present Illness  82 yo female presenting to ED on 2/29 with R hip pain after fall at home. S/p Cephalomedullary nailing of right intertrochanteric femur fracture on 3/2. PMH including paroxysmal atrial fibrillation on Eliquis, severe aortic stenosis s/p TAVR, mitral/tricuspid valve disease s/p repair, CAD, history of CVA with severe left MCA stenosis, CKD stage IIIa, type 2 diabetes mellitus, essential hypertension, hyperlipidemia, depression/anxiety, dementia  Clinical Impression   Pt admitted with above diagnosis. Lives at home alone, in a single-level home;  Prior to admission, pt was able to manage basic mobility and ADLs independently, uses a RW for amb, though forgets at times; had recently finished a rehab stay after a previous fall; Presents to PT with pain R hip limiting mobilty, decr activity tolerance; Today, needed 2 person Max assist to get up to eOB, 2 person mod assist to stand and taek steps to recliner; Once in recliner, pt with pale coloring and decreased responsiveness. BP soft (see general comments); notified RN and returned pt back to supine in bed.  Pt currently with functional limitations due to the deficits listed below (see PT Problem List). Pt will benefit from skilled PT to increase their independence and safety with mobility to allow discharge to the venue listed below.          Recommendations for follow up therapy are one component of a multi-disciplinary discharge planning process, led by the attending physician.  Recommendations may be updated based on patient status, additional functional criteria and insurance authorization.  Follow Up Recommendations Skilled nursing-short term rehab (<3 hours/day) Can patient physically be transported by private vehicle: No    Assistance Recommended at Discharge Frequent or constant  Supervision/Assistance  Patient can return home with the following  Two people to help with walking and/or transfers;Two people to help with bathing/dressing/bathroom;Assistance with cooking/housework;Direct supervision/assist for medications management;Direct supervision/assist for financial management;Help with stairs or ramp for entrance    Equipment Recommendations Rolling walker (2 wheels);BSC/3in1  Recommendations for Other Services       Functional Status Assessment Patient has had a recent decline in their functional status and/or demonstrates limited ability to make significant improvements in function in a reasonable and predictable amount of time     Precautions / Restrictions Precautions Precautions: Fall Precaution Comments: Monitor BP Restrictions Weight Bearing Restrictions: No      Mobility  Bed Mobility Overal bed mobility: Needs Assistance Bed Mobility: Supine to Sit, Sit to Supine     Supine to sit: Max assist, +2 for physical assistance Sit to supine: Total assist, +2 for physical assistance   General bed mobility comments: Max A for managing trunk and LEs. Total A to slide back to bed due to BP    Transfers Overall transfer level: Needs assistance Equipment used: Rolling walker (2 wheels) Transfers: Bed to chair/wheelchair/BSC, Sit to/from Stand Sit to Stand: Mod assist, +2 physical assistance Stand pivot transfers: Mod assist, +2 physical assistance         General transfer comment: Mod A +2 for power up and pivot to recliner    Ambulation/Gait                  Stairs            Wheelchair Mobility    Modified Rankin (Stroke Patients Only)       Balance Overall balance assessment: Needs assistance Sitting-balance support: No  upper extremity supported, Feet supported Sitting balance-Leahy Scale: Fair     Standing balance support: During functional activity, Bilateral upper extremity supported Standing balance-Leahy  Scale: Poor                               Pertinent Vitals/Pain Pain Assessment Pain Assessment: Faces Faces Pain Scale: Hurts even more Pain Location: RLE Pain Descriptors / Indicators: Discomfort, Grimacing Pain Intervention(s): Monitored during session    Home Living Family/patient expects to be discharged to:: Private residence Living Arrangements: Alone Available Help at Discharge: Family;Available 24 hours/day Type of Home: Apartment (first floor) Home Access: Level entry       Home Layout: One level Home Equipment: Conservation officer, nature (2 wheels);Cane - quad;Shower seat;Hand held shower head      Prior Function Prior Level of Function : Needs assist       Physical Assist : ADLs (physical);Mobility (physical)     Mobility Comments: Using walker; but doesnt always use ADLs Comments: ADLs and simple IADLs; daughter will come and assist with initating bathes and IADLs.     Hand Dominance   Dominant Hand: Right    Extremity/Trunk Assessment   Upper Extremity Assessment Upper Extremity Assessment: Defer to OT evaluation    Lower Extremity Assessment Lower Extremity Assessment: RLE deficits/detail RLE Deficits / Details: Grossly decr AROM and strength, limtied by pain postop RLE: Unable to fully assess due to pain    Cervical / Trunk Assessment Cervical / Trunk Assessment: Kyphotic  Communication   Communication: HOH  Cognition Arousal/Alertness: Awake/alert Behavior During Therapy: Flat affect Overall Cognitive Status: History of cognitive impairments - at baseline                                 General Comments: Dementia at baseline        General Comments General comments (skin integrity, edema, etc.): sitting 117/55 (73); pulse 85. Reclined 133/44 (65); supine in bed 123/32 (58) and 126/33 (59); trendelenburg 133/39 (66)    Exercises     Assessment/Plan    PT Assessment Patient needs continued PT services  PT Problem  List Decreased strength;Decreased range of motion;Decreased activity tolerance;Decreased balance;Decreased mobility;Decreased coordination;Decreased cognition;Decreased knowledge of use of DME;Decreased safety awareness;Decreased knowledge of precautions;Cardiopulmonary status limiting activity;Pain       PT Treatment Interventions DME instruction;Gait training;Functional mobility training;Therapeutic activities;Therapeutic exercise;Balance training;Neuromuscular re-education;Cognitive remediation;Patient/family education;Wheelchair mobility training    PT Goals (Current goals can be found in the Care Plan section)  Acute Rehab PT Goals Patient Stated Goal: Did not state, but agreeable to OOB PT Goal Formulation: Patient unable to participate in goal setting Time For Goal Achievement: 06/12/22 Potential to Achieve Goals: Fair    Frequency Min 3X/week     Co-evaluation PT/OT/SLP Co-Evaluation/Treatment: Yes Reason for Co-Treatment: For patient/therapist safety;To address functional/ADL transfers PT goals addressed during session: Mobility/safety with mobility OT goals addressed during session: ADL's and self-care       AM-PAC PT "6 Clicks" Mobility  Outcome Measure Help needed turning from your back to your side while in a flat bed without using bedrails?: A Lot Help needed moving from lying on your back to sitting on the side of a flat bed without using bedrails?: Total Help needed moving to and from a bed to a chair (including a wheelchair)?: Total Help needed standing up from a chair using your arms (  e.g., wheelchair or bedside chair)?: Total Help needed to walk in hospital room?: Total Help needed climbing 3-5 steps with a railing? : Total 6 Click Score: 7    End of Session Equipment Utilized During Treatment: Gait belt Activity Tolerance: Other (comment) (decr activity tolerance, BP/MAP drop after transfer to recliner and time upright in recliner; assisted pt back to bed via  drawsheet) Patient left: in bed;with call bell/phone within reach;with family/visitor present;with nursing/sitter in room Nurse Communication: Mobility status;Other (comment) (decr responsiveness once up in recliner) PT Visit Diagnosis: Unsteadiness on feet (R26.81);Other abnormalities of gait and mobility (R26.89);Muscle weakness (generalized) (M62.81);Pain;Other (comment) (Decr functional capacity) Pain - Right/Left: Right Pain - part of body: Hip    Time: [id number] PT Time Calculation (min) (ACUTE ONLY): 40 min   Charges:   PT Evaluation $PT Eval Moderate Complexity: [place], [place] Office [phone]   [name] 05/29/2022, 5:36 PM

## 2022-05-29 NOTE — Progress Notes (Addendum)
PROGRESS NOTE    Rachel Santos  [id number] DOB: 01-01-1941 DOA: 05/26/2022 PCP: Charlane Ferretti, MD    Brief Narrative:   Rachel Santos is a 82 y.o. female with past medical history significant for paroxysmal atrial fibrillation on Eliquis, severe aortic stenosis s/p TAVR, mitral/tricuspid valve disease s/p repair, CAD, history of CVA with severe left MCA stenosis, CKD stage IIIa, type 2 diabetes mellitus, essential hypertension, hyperlipidemia, depression/anxiety, dementia who presented to [facility] ED on 2/29 complaining of right hip pain after fall at home.  Patient currently lives alone with daughter checking on her twice daily.  Life alert was activated and EMS had to forcibly enter her residence.  Approximately down for 30 minutes.  Denies loss of consciousness.  Patient was given IV fentanyl and transferred by EMS to ED for further evaluation.  In the ED, temperature 97.3 F, HR 74, RR 11, BP 132/68, SpO2 98% on room air.  Sodium 139, potassium 4.0, chloride 102, CO2 24, glucose 152, BUN 26, creatinine 0.78.  AST 28, ALT 30, total bilirubin 0.5.  CK 43, high sensitive troponin 10 followed by 10.  Lactic acid 2.1.  WBC 10.2, hemoglobin 14.5, platelets 198.  EtOH level less than 10.  CT head without contrast with no acute intracranial process, mild diffuse atrophy.  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  Chest x-ray with no active cardiopulmonary disease process.  Right hip/pelvis x-ray with mildly displaced and comminuted intratrochanteric fracture of the right hip, old fracture of the superior pubic ramus on the right and inferior pubic ramus on the left.  Right knee x-ray with no acute fracture or dislocation, severe tricompartmental degenerative changes, small suprapatellar joint effusion with loose  bodies.  Orthopedics was consulted.  TRH consulted for admission for further evaluation management of acute right hip fracture.  Assessment & Plan:   Displaced/comminuted intratrochanteric fracture right hip Patient presenting to the ED via EMS after mechanical fall at home with immediate right hip pain and inability to ambulate. Right hip/pelvis x-ray with mildly displaced and comminuted intratrochanteric fracture of the right hip.  Orthopedics was consulted and patient underwent cephalomedullary nailing of right hip by Dr. Doreatha Martin on 05/28/2022. -- Orthopedics following, appreciate assistance -- Vitamin D 25 hydroxy level 45.53 -- WBAT RLE -- Norco 1-2 tablets every 6 hours as needed moderate pain -- Morphine 0.5 mg IV every 2 hours as needed severe pain -- Robaxin 5 mg p.o./IV every 6 hours as needed muscle spasms -- Restart home Eliquis 2.5 mg PO BID for postoperative DVT prophylaxis -- PT/OT evaluation: Pending  Postoperative blood loss anemia Hemoglobin 12.5 on admission, postoperatively down to 8.9; likely in the setting of long bone fracture. -- Hgb 12.5>8.9 -- Restart Eliquis as above -- CBC daily -- Transfuse for hemoglobin less than 7.0  Paroxysmal atrial fibrillation on Eliquis -- Eliquis 2.'5mg'$  PO BID -- Monitor on telemetry  Severe aortic stenosis s/p TAVR Mitral/tricuspid valve disease s/p repair, CAD Chronic diastolic congestive heart failure, compensated Hx essential hypertension TTE 05/27/2022 with LVEF 65 to 70%, no LV regional wall motion normalities, concentric LVH, severe PVR, moderate/severe AR, noted TAVR valve in aortic position, trivial MR, mild mitral stenosis, noted mitral/tricuspid valve repair.   Currently not on antihypertensives outpatient. -- Cardiology following, appreciate assistance -- Cardiology currently deferring TEE and recommends repeat TTE in 3-6 months  Hx  of CVA with severe left MCA stenosis Hx TIA Recent MR brain 05/24/2022 with no acute  findings. -- Continue statin, Eliquis -- Holding Plavix  CKD stage IIIa Creatinine 0.70, at baseline; stable  Type 2 diabetes mellitus with hyperglycemia Home regimen includes Levemir 28 units BID, Farxiga.  Hemoglobin A1c 8.3 on 10/17/2021, not optimally controlled. -- Levemir 10 units [place] twice daily -- Sensitive SSI for coverage -- CBGs qAC/HS  Hyperlipidemia -- Atorvastatin 80 mg p.o. daily  Depression/anxiety -- Venlafaxine XR to 25 mg p.o. daily  Dementia --Delirium precautions --Get up during the day --Encourage a familiar face to remain present throughout the day --Keep blinds open and lights on during daylight hours --Minimize the use of opioids/benzodiazepines  DVT prophylaxis: apixaban (ELIQUIS) tablet 2.5 mg Start: 05/29/22 1000 SCDs Start: 05/28/22 1028 SCDs Start: 05/26/22 2310 apixaban (ELIQUIS) tablet 2.5 mg    Code Status: Full Code Family Communication: Daughter updated at bedside this morning  Disposition Plan:  Level of care: Telemetry Medical Status is: Inpatient Remains inpatient appropriate because: Pending PT/OT evaluation, anticipate likely need of SNF placement    Consultants:  Orthopedics Cardiology  Procedures:  TTE:   Antimicrobials:  Perioperative cefazolin   Subjective: Patient seen examined bedside, resting comfortably.  Lying in bed.  Hemoglobin dropped to 8.9 this morning, discussed with orthopedics; will start Eliquis for postoperative DVT prophylaxis and continue to monitor hemoglobin.  Awaiting PT/OT evaluation.  No other questions or concerns at this time.  Denies headache, no dizziness, no chest pain, no shortness of breath, no abdominal pain, no fever, no nausea/vomiting/diarrhea. No acute events overnight per nursing staff.  Objective: Vitals:   05/28/22 2112 05/29/22 0434 05/29/22 0435 05/29/22 0749  BP: (!) 133/56 (!) 130/40 (!) 130/40 (!) 125/23  Pulse: 81 71 69 70  Resp:    15  Temp: 99.9 F (37.7 C)  99.8 F (37.7  C) 99.4 F (37.4 C)  TempSrc: Oral  Oral Oral  SpO2: 100% 99% 98% 100%  Weight:      Height:        Intake/Output Summary (Last 24 hours) at 05/29/2022 1058 Last data filed at 05/29/2022 [id number] Gross per 24 hour  Intake 1347.2 ml  Output 550 ml  Net 797.2 ml   Filed Weights   05/26/22 2031 05/27/22 0216  Weight: 66.2 kg 60.3 kg    Examination:  Physical Exam: GEN: NAD, alert and oriented to place (hospital), and clearly identifies her daughter in the room, but not oriented to time HEENT: NCAT, PERRL, EOMI, sclera clear, MMM PULM: CTAB w/o wheezes/crackles, normal respiratory effort, on room air CV: RRR w/o M/G/R GI: abd soft, NTND, NABS, no R/G/M MSK: no peripheral edema, right lower extremity with surgical dressing in place, clean/dry/intact, no significant ecchymosis, no erythema/fluctuance NEURO: CN II-XII intact, no focal deficits, sensation to light touch intact PSYCH: normal mood/affect Integumentary: dry/intact, no rashes or wounds    Data Reviewed: I have personally reviewed following labs and imaging studies  CBC: Recent Labs  Lab 05/23/22 2023 05/23/22 2031 05/26/22 2025 05/26/22 2037 05/28/22 0446 05/29/22 0443  WBC 7.9  --  10.2  --  11.2* 10.0  NEUTROABS 4.7  --   --   --   --   --   HGB 14.3 13.9 14.5 14.3 12.5 8.9*  HCT 44.5 41.0 43.1 42.0 37.3 26.3*  MCV 91.9  --  89.0  --  89.2 90.1  PLT 198  --  198  --  160 134*  Basic Metabolic Panel: Recent Labs  Lab 05/23/22 2023 05/23/22 2031 05/26/22 2025 05/26/22 2037 05/26/22 2233 05/28/22 0446 05/29/22 0443  NA 140 140 139 140  --  136 136  K 3.7 3.7 4.0 4.1  --  4.3 4.0  CL 105 106 102 105  --  99 104  CO2 23  --  24  --   --  25 22  GLUCOSE 99 99 152* 147*  --  138* 196*  BUN 27* 27* 26* 26*  --  29* 36*  CREATININE 0.80 0.70 0.78 0.70  --  0.85 0.84  CALCIUM 9.1  --  9.5  --   --  8.7* 8.1*  MG  --   --   --   --  1.9 2.0  --   PHOS  --   --   --   --  3.4  --   --    GFR: Estimated  Creatinine Clearance: 48.3 mL/min (by C-G formula based on SCr of 0.84 mg/dL). Liver Function Tests: Recent Labs  Lab 05/23/22 2023 05/26/22 2025  AST 31 28  ALT 35 30  ALKPHOS 72 79  BILITOT 0.5 0.5  PROT 6.7 6.7  ALBUMIN 3.6 3.7   No results for input(s): "LIPASE", "AMYLASE" in the last 168 hours. No results for input(s): "AMMONIA" in the last 168 hours. Coagulation Profile: Recent Labs  Lab 05/23/22 2023 05/26/22 2025  INR 1.1 1.1   Cardiac Enzymes: Recent Labs  Lab 05/26/22 2025  CKTOTAL 43   BNP (last 3 results) No results for input(s): "PROBNP" in the last 8760 hours. HbA1C: Recent Labs    05/26/22 2320  HGBA1C 9.8*   CBG: Recent Labs  Lab 05/28/22 1613 05/28/22 2039 05/29/22 0015 05/29/22 0442 05/29/22 0744  GLUCAP 274* 296* 250* 188* 168*   Lipid Profile: No results for input(s): "CHOL", "HDL", "LDLCALC", "TRIG", "CHOLHDL", "LDLDIRECT" in the last 72 hours. Thyroid Function Tests: Recent Labs    05/26/22 2320  TSH 2.375   Anemia Panel: No results for input(s): "VITAMINB12", "FOLATE", "FERRITIN", "TIBC", "IRON", "RETICCTPCT" in the last 72 hours. Sepsis Labs: Recent Labs  Lab 05/26/22 2025 05/26/22 2320 05/27/22 0115  LATICACIDVEN 2.1* 1.5 0.9    Recent Results (from the past 240 hour(s))  Surgical pcr screen     Status: Abnormal   Collection Time: 05/28/22  7:16 AM   Specimen: Nasal Mucosa; Nasal Swab  Result Value Ref Range Status   MRSA, PCR NEGATIVE NEGATIVE Final   Staphylococcus aureus POSITIVE (A) NEGATIVE Final    Comment: (NOTE) The Xpert SA Assay (FDA approved for NASAL specimens in patients 68 years of age and older), is one component of a comprehensive surveillance program. It is not intended to diagnose infection nor to guide or monitor treatment. Performed at [facility], [place] [address] [place], [place] [address]          Radiology Studies: DG HIP PORT [facility] OR W/O PELVIS 1V RIGHT  Result Date:  05/28/2022 CLINICAL DATA:  Fracture, postop. EXAM: DG HIP (WITH OR WITHOUT PELVIS) 1V PORT RIGHT COMPARISON:  Preoperative radiographs. FINDINGS: Intramedullary nail with trans trochanteric and distal locking screw fixation traverse proximal femur fracture. Improved fracture alignment from preoperative imaging. Recent postsurgical change includes air and edema in the soft tissues. Vascular stents are seen. Remote pubic rami fractures. IMPRESSION: ORIF right proximal femur fracture. No immediate postoperative complication. Electronically Signed   By: Keith Rake M.D.   On: 05/28/2022 11:42  DG HIP UNILAT WITH PELVIS 2-3 VIEWS RIGHT  Result Date: 05/28/2022 CLINICAL DATA:  Status post ORIF of proximal right femur. EXAM: DG HIP (WITH OR WITHOUT PELVIS) 2-3V RIGHT COMPARISON:  05/26/2022 FINDINGS: Six images obtained portably in the operating room via C-arm radiography show open reduction and internal fixation of the intertrochanteric fracture of the proximal right femur. Placement of IM nail and hip screws noted. On the final image the hardware components and fracture fragments are in anatomic alignment. IMPRESSION: Status post ORIF of intertrochanteric fracture of the proximal right femur. Electronically Signed   By: Kerby Moors M.D.   On: 05/28/2022 10:45   DG C-Arm 1-60 Min-No Report  Result Date: 05/28/2022 Fluoroscopy was utilized by the requesting physician.  No radiographic interpretation.   ECHOCARDIOGRAM COMPLETE  Result Date: 05/27/2022    ECHOCARDIOGRAM REPORT   Patient Name:   Rachel Santos Date of Exam: 05/27/2022 Medical Rec #:  [id number]         Height:       67.0 in Accession #:    [id number]        Weight:       132.9 lb Date of Birth:  September 13, 1940        BSA:          1.700 m Patient Age:    72 years          BP:           117/59 mmHg Patient Gender: F                 HR:           73 bpm. Exam Location:  Inpatient Procedure: 2D Echo, 3D Echo, Cardiac Doppler and Color Doppler                                 MODIFIED REPORT:     This report was modified by Rudean Haskell MD on 05/27/2022 due to                                 Clarification.  Indications:     Z01.818 Encounter for other preprocedural examination  History:         Patient has prior history of Echocardiogram examinations, most                  recent 01/30/2020. Stroke, Aortic Valve Disease, Mitral Valve                  Disease and Tricuspid valve disease.; Risk Factors:Hypertension                  and Diabetes. Aortic stenosis. Mitral valve repair. Tricuspid                  valve repair. Aortic valve replacement.                  Aortic Valve: 23 mm Sapien prosthetic, stented (TAVR) valve is                  present in the aortic position. Procedure Date: 01/22/2019.                  Mitral Valve: 26 mm Memo prosthetic annuloplasty ring valve is  present in the mitral position. Procedure Date: 10/11/2012.  Sonographer:     Roseanna Rainbow RDCS Referring Phys:  [place] Diagnosing Phys: Rudean Haskell MD IMPRESSIONS  1. Circumferential extent of PVR (10_20%), location ~ 12 o'clock on PSAX (images 30-31). Jet extent 0.75 cm of LVOT (image 68). In the PSAX image series regurgitation looks severe and central. The aortic valve has been repaired/replaced. Aortic valve regurgitation is moderate to severe. There is a 23 mm Sapien prosthetic (TAVR) valve present in the aortic position. Procedure Date: 01/22/2019. Aortic valve mean gradient measures 13.0 mmHg.  2. The mitral valve has been repaired/replaced. Trivial mitral valve regurgitation. Mild mitral stenosis. The mean mitral valve gradient is 4.0 mmHg with average heart rate of 76 bpm. There is a 26 mm Memo prosthetic annuloplasty ring present in the mitral position. Procedure Date: 10/11/2012.  3. The tricuspid valve is status post repair on 05/27/2013 with a 62m EMC3 annuloplasty ring.  4. Left ventricular ejection fraction, by estimation, is 65 to  70%. Left ventricular ejection fraction by 3D volume is 70 %. The left ventricle has normal function. The left ventricle has no regional wall motion abnormalities. There is moderate concentric left ventricular hypertrophy. Left ventricular diastolic parameters are indeterminate.  5. Right ventricular systolic function is hyperdynamic. The right ventricular size is normal. Comparison(s): There appears to have been central and paravalvular regurgitation in 2021, but regurgitation has increased from prior. Conclusion(s)/Recommendation(s): Given multiple prosthetic valves, recommend TEE for assessment of prosthetic valve function with a focus on aortic valve regurgitation. FINDINGS  Left Ventricle: Left ventricular ejection fraction, by estimation, is 65 to 70%. Left ventricular ejection fraction by 3D volume is 70 %. The left ventricle has normal function. The left ventricle has no regional wall motion abnormalities. The left ventricular internal cavity size was small. There is moderate concentric left ventricular hypertrophy. Left ventricular diastolic parameters are indeterminate. Right Ventricle: The right ventricular size is normal. Right vetricular wall thickness was not well visualized. Right ventricular systolic function is hyperdynamic. Left Atrium: Left atrial size was normal in size. Right Atrium: Right atrial size was normal in size. Pericardium: There is no evidence of pericardial effusion. Mitral Valve: 2D MVA 2.24 cm2. The mitral valve has been repaired/replaced. Trivial mitral valve regurgitation. There is a 26 mm Memo prosthetic annuloplasty ring present in the mitral position. Procedure Date: 10/11/2012. Mild mitral valve stenosis. MV peak gradient, 9.7 mmHg. The mean mitral valve gradient is 4.0 mmHg with average heart rate of 76 bpm. Tricuspid Valve: Tricuspid Valve mean gradient 1 mm Hg at a heart rate of 79 bpm. The tricuspid valve is has been repaired/replaced. Tricuspid valve regurgitation is not  demonstrated. The tricuspid valve is status post repair with an annuloplasty ring. Aortic Valve: Circumferential extent of PVR (10_20%), location 11- o'clock on PSAX (images 30-31). Jet extent 0.75 cm of LVOT (image 68). In the PSAX image series regurgitation looks severe and central. The aortic valve has been repaired/replaced. Aortic  valve regurgitation is moderate to severe. Aortic regurgitation PHT measures 182 msec. Aortic valve mean gradient measures 13.0 mmHg. Aortic valve peak gradient measures 24.8 mmHg. Aortic valve area, by VTI measures 2.40 cm. There is a 23 mm Sapien prosthetic, stented (TAVR) valve present in the aortic position. Procedure Date: 01/22/2019. Pulmonic Valve: The pulmonic valve was not well visualized. Pulmonic valve regurgitation is trivial. No evidence of pulmonic stenosis. Aorta: The aortic root and ascending aorta are structurally normal, with no evidence of dilitation. IAS/Shunts: No  atrial level shunt detected by color flow Doppler.  LEFT VENTRICLE PLAX 2D LVIDd:         3.80 cm         Diastology LVIDs:         1.80 cm         LV e' medial:    4.79 cm/s LV PW:         1.10 cm         LV E/e' medial:  29.2 LV IVS:        1.30 cm         LV e' lateral:   7.40 cm/s LVOT diam:     2.20 cm         LV E/e' lateral: 18.9 LV SV:         101 LV SV Index:   59 LVOT Area:     3.80 cm        3D Volume EF                                LV 3D EF:    Left                                             ventricul LV Volumes (MOD)                            ar LV vol d, MOD    71.3 ml                    ejection A2C:                                        fraction LV vol d, MOD    51.5 ml                    by 3D A4C:                                        volume is LV vol s, MOD    17.8 ml                    70 %. A2C: LV vol s, MOD    12.6 ml A4C:                           3D Volume EF: LV SV MOD A2C:   53.5 ml       3D EF:        70 % LV SV MOD A4C:   51.5 ml       LV EDV:       105 ml LV SV MOD  BP:    47.0 ml       LV ESV:       31 ml                                LV SV:  74 ml RIGHT VENTRICLE             IVC RV S prime:     14.60 cm/s  IVC diam: 1.60 cm TAPSE (M-mode): 1.1 cm LEFT ATRIUM             Index        RIGHT ATRIUM           Index LA diam:        3.20 cm 1.88 cm/m   RA Area:     15.10 cm LA Vol (A2C):   75.1 ml 44.18 ml/m  RA Volume:   37.20 ml  21.88 ml/m LA Vol (A4C):   39.8 ml 23.41 ml/m LA Biplane Vol: 54.2 ml 31.88 ml/m  AORTIC VALVE AV Area (Vmax):    2.49 cm AV Area (Vmean):   2.42 cm AV Area (VTI):     2.40 cm AV Vmax:           249.00 cm/s AV Vmean:          161.500 cm/s AV VTI:            0.421 m AV Peak Grad:      24.8 mmHg AV Mean Grad:      13.0 mmHg LVOT Vmax:         163.00 cm/s LVOT Vmean:        103.000 cm/s LVOT VTI:          0.266 m LVOT/AV VTI ratio: 0.63 AI PHT:            182 msec  AORTA Ao Root diam: 3.00 cm Ao Asc diam:  3.50 cm MITRAL VALVE MV Area (PHT): 2.91 cm     SHUNTS MV Area VTI:   2.11 cm     Systemic VTI:  0.27 m MV Peak grad:  9.7 mmHg     Systemic Diam: 2.20 cm MV Mean grad:  4.0 mmHg MV Vmax:       1.56 m/s MV Vmean:      97.5 cm/s MV Decel Time: 261 msec MV E velocity: 140.00 cm/s MV A velocity: 91.60 cm/s MV E/A ratio:  1.53 Rudean Haskell MD Electronically signed by Rudean Haskell MD Signature Date/Time: 05/27/2022/1:57:46 PM    Final (Updated)         Scheduled Meds:  apixaban  2.5 mg Oral BID   atorvastatin  80 mg Oral Daily   Chlorhexidine Gluconate Cloth  6 each Topical Q0600   docusate sodium  100 mg Oral BID   insulin aspart  0-9 Units Subcutaneous Q4H   insulin detemir  10 Units Subcutaneous BID   mupirocin ointment  1 Application Nasal BID   venlafaxine XR  225 mg Oral Q breakfast   Continuous Infusions:  sodium chloride 50 mL/hr at 05/29/22 0733   methocarbamol (ROBAXIN) IV       LOS: 3 days    Time spent: 48 minutes spent on chart review, discussion with nursing staff, consultants, updating  family and interview/physical exam; more than 50% of that time was spent in counseling and/or coordination of care.    Ranie Chinchilla J [place] DO [facility] Available via Epic secure chat 7am-7pm After these hours, please refer to coverage provider listed on [email or web address] 05/29/2022, 10:58 AM

## 2022-05-30 ENCOUNTER — Encounter (HOSPITAL_COMMUNITY): Payer: Self-pay | Admitting: Student

## 2022-05-30 LAB — GLUCOSE, CAPILLARY
Glucose-Capillary: 193 mg/dL — ABNORMAL HIGH (ref 70–99)
Glucose-Capillary: 264 mg/dL — ABNORMAL HIGH (ref 70–99)
Glucose-Capillary: 278 mg/dL — ABNORMAL HIGH (ref 70–99)
Glucose-Capillary: 279 mg/dL — ABNORMAL HIGH (ref 70–99)

## 2022-05-30 LAB — CBC
HCT: 23.9 % — ABNORMAL LOW (ref 36.0–46.0)
Hemoglobin: 7.6 g/dL — ABNORMAL LOW (ref 12.0–15.0)
MCH: 29.7 pg (ref 26.0–34.0)
MCHC: 31.8 g/dL (ref 30.0–36.0)
MCV: 93.4 fL (ref 80.0–100.0)
Platelets: 134 10*3/uL — ABNORMAL LOW (ref 150–400)
RBC: 2.56 MIL/uL — ABNORMAL LOW (ref 3.87–5.11)
RDW: 14.4 % (ref 11.5–15.5)
WBC: 10.2 10*3/uL (ref 4.0–10.5)
nRBC: 0 % (ref 0.0–0.2)

## 2022-05-30 LAB — HEMOGLOBIN AND HEMATOCRIT, BLOOD
HCT: 25.2 % — ABNORMAL LOW (ref 36.0–46.0)
Hemoglobin: 8.3 g/dL — ABNORMAL LOW (ref 12.0–15.0)

## 2022-05-30 LAB — PREPARE RBC (CROSSMATCH)

## 2022-05-30 MED ORDER — INSULIN DETEMIR 100 UNIT/ML ~~LOC~~ SOLN
15.0000 [IU] | Freq: Two times a day (BID) | SUBCUTANEOUS | Status: DC
  Administered 2022-05-30 – 2022-06-02 (×5): 15 [IU] via SUBCUTANEOUS
  Filled 2022-05-30 (×7): qty 0.15

## 2022-05-30 MED ORDER — SODIUM CHLORIDE 0.9% IV SOLUTION: Freq: Once | INTRAVENOUS | Status: AC

## 2022-05-30 MED ORDER — HYDROCODONE-ACETAMINOPHEN 5-325 MG PO TABS: 1.0000 | ORAL_TABLET | Freq: Four times a day (QID) | ORAL | 0 refills | Status: DC | PRN

## 2022-05-30 NOTE — NC FL2 (Signed)
[place] LEVEL OF CARE FORM     IDENTIFICATION  Patient Name: Rachel Santos Birthdate: [birth year] Sex: female Admission Date (Current Location): 05/26/2022  [facility] and [place] Number:  Herbalist and Address:  [facility], [place] [address], [place], [place] [address]      Provider Number: [id number]  Attending Physician Name and Address:  [place] Eric J, DO  Relative Name and Phone Number:  Renee Ramus,  [phone]    Current Level of Care: Hospital Recommended Level of Care: [place] Prior Approval Number:    Date Approved/Denied:   PASRR Number: [id number]  Discharge Plan: SNF    Current Diagnoses: Patient Active Problem List   Diagnosis Date Noted   Lactic acidosis 05/27/2022   Closed right hip fracture ([place]) 05/26/2022   Fall 12/20/2021   Humerus fracture 12/20/2021   Rectal bleeding    Simple adnexal cyst greater than 5 cm in diameter in premenopausal patient 10/17/2021   Orthostatic hypotension 10/17/2021   Pyuria 10/17/2021   Microscopic hematuria 10/17/2021   Acute GI bleeding 10/16/2021   History of CVA (cerebrovascular accident) 10/16/2021   Chronic kidney disease, stage 3a ([place]) 10/16/2021   Depression with anxiety 10/16/2021   Abnormal finding on imaging 10/16/2021   Cerebral artery occlusion 06/27/2019   Acute blood loss anemia, transfused 2 units PRBC total this admit 01/27/2019   Groin hematoma with urgent evacuation 01/23/19 01/23/2019   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   Type 2 diabetes mellitus with complication, with long-term current use of insulin (HCC)    Severe aortic stenosis    HTN (hypertension) 08/13/2013   Insomnia 08/13/2013   S/P TVR (tricuspid valve repair) 05/27/2013   Long term (current) use of anticoagulants 10/25/2012   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   S/P Maze operation for atrial fibrillation 10/11/2012   PAF  (paroxysmal atrial fibrillation) ([place]) 07/03/2012    Orientation RESPIRATION BLADDER Height & Weight     Self  Normal Incontinent, External catheter Weight: 132 lb 15 oz (60.3 kg) Height:  '5\' 7"'$  (170.2 cm)  BEHAVIORAL SYMPTOMS/MOOD NEUROLOGICAL BOWEL NUTRITION STATUS      Incontinent Diet (See Dc summary)  AMBULATORY STATUS COMMUNICATION OF NEEDS Skin   Extensive Assist Verbally Surgical wounds (R hip incision)                       Personal Care Assistance Level of Assistance  Bathing, Feeding, Dressing Bathing Assistance: Maximum assistance Feeding assistance: Limited assistance Dressing Assistance: Maximum assistance     Functional Limitations Info  Sight, Hearing, Speech Sight Info: Impaired Hearing Info: Adequate Speech Info: Adequate    SPECIAL CARE FACTORS FREQUENCY  PT (By licensed PT), OT (By licensed OT)     PT Frequency: 5x week OT Frequency: 5x week            Contractures Contractures Info: Not present    Additional Factors Info  Code Status, Allergies, Psychotropic, Insulin Sliding Scale Code Status Info: Full Allergies Info: Buspirone Hcl  Metformin And Related  Ozempic (0.25 Or 0.5 Mg-dose) (Semaglutide(0.25 Or 0.'5mg'$ -dos))  Ultram (Tramadol)  Nickel  Pneumococcal Vaccines Psychotropic Info: Venlaxafine XR Insulin Sliding Scale Info: See DC summary       Current Medications (05/30/2022):  This is the current hospital active medication list Current Facility-Administered Medications  Medication Dose Route Frequency Provider Last Rate Last Admin   0.9 %  sodium chloride infusion  Intravenous Continuous Corinne Ports, PA-C 50 mL/hr at 05/30/22 [id number] New Bag at 05/30/22 [id number]   acetaminophen (TYLENOL) tablet 325-650 mg  325-650 mg Oral Q6H PRN Corinne Ports, PA-C       apixaban Arne [place]) tablet 2.5 mg  2.5 mg Oral BID Corinne Ports, PA-C   2.5 mg at 05/30/22 [id number]   aspirin EC tablet 81 mg  81 mg Oral Daily [place] Eric J, DO   81 mg at  05/30/22 0955   atorvastatin (LIPITOR) tablet 80 mg  80 mg Oral Daily Corinne Ports, PA-C   80 mg at 05/30/22 [id number]   Chlorhexidine Gluconate Cloth 2 % PADS 6 each  6 each Topical Q0600 [place] Eric J, DO   6 each at 05/30/22 [id number]   cyanocobalamin (VITAMIN B12) tablet 500 mcg  500 mcg Oral Q Mon [place] Donnamarie Poag, DO   500 mcg at 05/30/22 [id number]   dapagliflozin propanediol (FARXIGA) tablet 10 mg  10 mg Oral Daily [place] Eric J, DO   10 mg at 05/30/22 [id number]   docusate sodium (COLACE) capsule 100 mg  100 mg Oral BID Corinne Ports, PA-C   100 mg at 05/30/22 0955   HYDROcodone-acetaminophen (NORCO/VICODIN) 5-325 MG per tablet 1-2 tablet  1-2 tablet Oral Q6H PRN Corinne Ports, PA-C   1 tablet at 05/29/22 1257   insulin aspart (novoLOG) injection 0-9 Units  0-9 Units Subcutaneous TID WC [place] Eric J, DO   2 Units at 05/30/22 [id number]   insulin detemir (LEVEMIR) injection 10 Units  10 Units Subcutaneous BID Corinne Ports, PA-C   10 Units at 05/30/22 [id number]   methocarbamol (ROBAXIN) tablet 500 mg  500 mg Oral Q6H PRN Corinne Ports, PA-C       Or   methocarbamol (ROBAXIN) 500 mg in dextrose 5 % 50 mL IVPB  500 mg Intravenous Q6H PRN Thereasa Solo, Sarah A, PA-C       metoCLOPramide (REGLAN) tablet 5-10 mg  5-10 mg Oral Q8H PRN Thereasa Solo, Sarah A, PA-C       Or   metoCLOPramide (REGLAN) injection 5-10 mg  5-10 mg Intravenous Q8H PRN Corinne Ports, PA-C       morphine (PF) 2 MG/ML injection 0.5 mg  0.5 mg Intravenous Q2H PRN Corinne Ports, PA-C   0.5 mg at 05/27/22 0214   multivitamin with minerals tablet 1 tablet  1 tablet Oral Daily [place] Eric J, DO   1 tablet at 05/30/22 [id number]   mupirocin ointment (BACTROBAN) 2 % 1 Application  1 Application Nasal BID [place] Eric J, DO   1 Application at [id number]   ondansetron (ZOFRAN) tablet 4 mg  4 mg Oral Q6H PRN Corinne Ports, PA-C       Or   ondansetron (ZOFRAN) injection 4 mg  4 mg Intravenous Q6H PRN Thereasa Solo, Sarah A, PA-C       pantoprazole  (PROTONIX) EC tablet 80 mg  80 mg Oral Daily [place] Eric J, DO   80 mg at 05/30/22 [id number]   polyethylene glycol (MIRALAX / GLYCOLAX) packet 17 g  17 g Oral Daily PRN Corinne Ports, PA-C       venlafaxine XR (EFFEXOR-XR) 24 hr capsule 225 mg  225 mg Oral Q breakfast Corinne Ports, PA-C   225 mg at 05/30/22 [id number]     Discharge Medications: Please see discharge summary for a list of discharge medications.  Relevant Imaging Results:  Relevant Lab Results:  Additional Information SSN: [id number]  Coralee Pesa, LCSWA

## 2022-05-30 NOTE — Care Management Important Message (Signed)
Important Message  Patient Details  Name: Rachel Santos MRN: [id number] Date of Birth: Jan 31, 1941   Medicare Important Message Given:  Yes     [name] 05/30/2022, 12:05 PM

## 2022-05-30 NOTE — TOC Initial Note (Signed)
Transition of Care [facility]) - Initial/Assessment Note    Patient Details  Name: Rachel Santos MRN: [id number] Date of Birth: [birth year]  Transition of [facility]) CM/SW Contact:    Rachel Santos, [place] Phone Number: 05/30/2022, 12:11 PM  Clinical Narrative:                  CSW noted pt is currently disoriented and spoke with Dtr Rachel Santos. Rachel Santos states pt has been to [facility], but they would like a different facility. [facility] was noted as a preference. Rachel Santos notes the sisters live in [place], [place], and [place], and open to fax out in those areas. Insurance coverage was discussed and questions answered. CSW also spoke with dtr, Rachel Santos, to discuss Medicaid questions, pt currently has family planning. Workup completed and pt faxed out. TOC will continue to follow for DC needs.  Expected Discharge Plan: Skilled Nursing Facility Barriers to Discharge: Continued Medical Work up, SNF Pending bed offer, Insurance Authorization   Patient Goals and CMS Choice Patient states their goals for this hospitalization and ongoing recovery are:: Pt is disoriented and unable to participate in goal setting at this time. CMS Medicare.gov Compare Post Acute Care list provided to:: Patient Represenative (must comment) (Daughter)        Expected Discharge Plan and Services     Post Acute Care Choice: [place] Living arrangements for the past 2 months: Single Family Home                                      Prior Living Arrangements/Services Living arrangements for the past 2 months: Single Family Home Lives with:: Self Patient language and need for interpreter reviewed:: Yes Do you feel safe going back to the place where you live?: Yes      Need for Family Participation in Patient Care: Yes (Comment) Care giver support system in place?: Yes (comment)   Criminal Activity/Legal Involvement Pertinent to Current Situation/Hospitalization: No - Comment as  needed  Activities of Daily Living Home Assistive Devices/Equipment: Environmental consultant (specify type) (Front wheel) ADL Screening (condition at time of admission) Patient's cognitive ability adequate to safely complete daily activities?: Yes Is the patient deaf or have difficulty hearing?: Yes Does the patient have difficulty seeing, even when wearing glasses/contacts?: Yes Does the patient have difficulty concentrating, remembering, or making decisions?: Yes Patient able to express need for assistance with ADLs?: Yes Does the patient have difficulty dressing or bathing?: Yes Independently performs ADLs?: No Communication: Independent Dressing (OT): Needs assistance Is this a change from baseline?: Change from baseline, expected to last >3 days Grooming: Needs assistance Is this a change from baseline?: Change from baseline, expected to last >3 days Feeding: Independent Bathing: Needs assistance Is this a change from baseline?: Change from baseline, expected to last >3 days Toileting: Independent with device (comment) (Female external cath) In/Out Bed: Needs assistance Walks in Home: Independent with device (comment) (Front wheel walker) Does the patient have difficulty walking or climbing stairs?: Yes Weakness of Legs: Right Weakness of Arms/Hands: None  Permission Sought/Granted Permission sought to share information with : Family Supports Permission granted to share information with : Yes, Verbal Permission Granted  Share Information with NAME: Daughters           Emotional Assessment Appearance:: Appears stated age Attitude/Demeanor/Rapport: Unable to Assess Affect (typically observed): Unable to Assess Orientation: : Oriented to Self Alcohol / Substance Use:  Not Applicable Psych Involvement: No (comment)  Admission diagnosis:  Closed right hip fracture ([place]) [S72.001A] Closed fracture of right hip, initial encounter [facility]) [S72.001A] Patient Active Problem List   Diagnosis Date  Noted   Lactic acidosis 05/27/2022   Closed right hip fracture ([place]) 05/26/2022   Fall 12/20/2021   Humerus fracture 12/20/2021   Rectal bleeding    Simple adnexal cyst greater than 5 cm in diameter in premenopausal patient 10/17/2021   Orthostatic hypotension 10/17/2021   Pyuria 10/17/2021   Microscopic hematuria 10/17/2021   Acute GI bleeding 10/16/2021   History of CVA (cerebrovascular accident) 10/16/2021   Chronic kidney disease, stage 3a ([place]) 10/16/2021   Depression with anxiety 10/16/2021   Abnormal finding on imaging 10/16/2021   Cerebral artery occlusion 06/27/2019   Acute blood loss anemia, transfused 2 units PRBC total this admit 01/27/2019   Groin hematoma with urgent evacuation 01/23/19 01/23/2019   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   Type 2 diabetes mellitus with complication, with long-term current use of insulin (HCC)    Severe aortic stenosis    HTN (hypertension) 08/13/2013   Insomnia 08/13/2013   S/P TVR (tricuspid valve repair) 05/27/2013   Long term (current) use of anticoagulants 10/25/2012   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   S/P Maze operation for atrial fibrillation 10/11/2012   PAF (paroxysmal atrial fibrillation) ([place]) 07/03/2012   PCP:  Rachel Ferretti, MD Pharmacy:   [place], [place] [address] Phone: [phone] Fax: [place] [address] Phone: [phone] Fax: [phone]     Social Determinants of Health (SDOH) Social History: SDOH Screenings   Food Insecurity: No Food Insecurity (12/20/2021)  Housing: Low Risk  (12/20/2021)  Transportation Needs: No Transportation Needs (12/20/2021)  Utilities: Not At Risk (12/20/2021)  Depression (PHQ2-9): Low Risk  (03/27/2019)  Tobacco Use: Unknown (05/30/2022)   SDOH Interventions:     Readmission  Risk Interventions    10/19/2021   12:59 PM  Readmission Risk Prevention Plan  Post Dischage Appt Complete  Medication Screening Complete  Transportation Screening Complete

## 2022-05-30 NOTE — Progress Notes (Signed)
Physical Therapy Treatment Patient Details Name: Rachel Santos MRN: [id number] DOB: 16-Apr-1940 Today's Date: 05/30/2022   History of Present Illness 82 yo female presenting to ED on 2/29 with R hip pain after fall at home. S/p Cephalomedullary nailing of right intertrochanteric femur fracture on 3/2. PMH including paroxysmal atrial fibrillation on Eliquis, severe aortic stenosis s/p TAVR, mitral/tricuspid valve disease s/p repair, CAD, history of CVA with severe left MCA stenosis, CKD stage IIIa, type 2 diabetes mellitus, essential hypertension, hyperlipidemia, depression/anxiety, dementia    PT Comments    Continuing work on functional mobility and activity tolerance;  Pt receiving blood, and noting better color today and much brighter and talkative; Able to move her RLE better as well, and completed ROM/AAROM therex before getting up to EOB; tolerated EOB well, and stood at bedside with heavy mod assist; Did not tolerate standing long, and assisted back to bed, bed in semi-chair position;     05/30/22 1300 05/30/22 1343  Orthostatic Sitting  BP- Sitting (!) 134/39 142/43  Pulse- Sitting 84 75 (bed in semi-chair position, after brief standing at bedside)     Recommendations for follow up therapy are one component of a multi-disciplinary discharge planning process, led by the attending physician.  Recommendations may be updated based on patient status, additional functional criteria and insurance authorization.  Follow Up Recommendations  Skilled nursing-short term rehab (<3 hours/day) Can patient physically be transported by private vehicle: No   Assistance Recommended at Discharge Frequent or constant Supervision/Assistance  Patient can return home with the following Two people to help with walking and/or transfers;Two people to help with bathing/dressing/bathroom;Assistance with cooking/housework;Direct supervision/assist for medications management;Direct supervision/assist for  financial management;Help with stairs or ramp for entrance   Equipment Recommendations  Rolling walker (2 wheels);BSC/3in1    Recommendations for Other Services       Precautions / Restrictions Precautions Precautions: Fall Precaution Comments: Monitor BP Restrictions Weight Bearing Restrictions: No     Mobility  Bed Mobility Overal bed mobility: Needs Assistance Bed Mobility: Supine to Sit, Sit to Supine     Supine to sit: Mod assist Sit to supine: Max assist   General bed mobility comments: Heavy mod A for managing trunk and LEs; bettera ble to pull trunk up with use of bedrail today; Max assist to help LEs into bed    Transfers Overall transfer level: Needs assistance Equipment used: Rolling walker (2 wheels) Transfers: Sit to/from Stand Sit to Stand: Mod assist, +2 safety/equipment           General transfer comment: Stood from bedside with heavy mod assist to power up; daughter present and encouraging; attmepted sidesteps towards [facility], however difficulty accetping weight RLE to allow for LLE stepping    Ambulation/Gait                   Stairs             Wheelchair Mobility    Modified Rankin (Stroke Patients Only)       Balance     Sitting balance-Leahy Scale: Fair     Standing balance support: During functional activity, Bilateral upper extremity supported Standing balance-Leahy Scale: Poor                              Cognition Arousal/Alertness: Awake/alert Behavior During Therapy: WFL for tasks assessed/performed Overall Cognitive Status: History of cognitive impairments - at baseline  General Comments: Dementia at baseline; pleasant adnd engaged in session        Exercises Total Joint Exercises Ankle Circles/Pumps: AROM, Both, 5 reps Quad Sets: AROM, Both, 10 reps Gluteal Sets: AROM, Both, 10 reps Heel Slides: AAROM, Right, 10 reps Hip ABduction/ADduction:  AAROM, Right, 10 reps    General Comments General comments (skin integrity, edema, etc.): Daughter Rachel Santos present and helpful      Pertinent Vitals/Pain Pain Assessment Pain Assessment: Faces Faces Pain Scale: Hurts even more Pain Location: RLE, in particualr with standing trial/weight bearing Pain Descriptors / Indicators: Discomfort, Grimacing Pain Intervention(s): Monitored during session    Home Living                          Prior Function            PT Goals (current goals can now be found in the care plan section) Acute Rehab PT Goals Patient Stated Goal: Has been wanting OOB today PT Goal Formulation: Patient unable to participate in goal setting Time For Goal Achievement: 06/12/22 Potential to Achieve Goals: Fair Progress towards PT goals: Progressing toward goals    Frequency    Min 3X/week      PT Plan Current plan remains appropriate    Co-evaluation              AM-PAC PT "6 Clicks" Mobility   Outcome Measure  Help needed turning from your back to your side while in a flat bed without using bedrails?: A Lot Help needed moving from lying on your back to sitting on the side of a flat bed without using bedrails?: A Lot Help needed moving to and from a bed to a chair (including a wheelchair)?: A Lot Help needed standing up from a chair using your arms (e.g., wheelchair or bedside chair)?: A Lot Help needed to walk in hospital room?: Total Help needed climbing 3-5 steps with a railing? : Total 6 Click Score: 10    End of Session Equipment Utilized During Treatment: Gait belt Activity Tolerance: Patient tolerated treatment well (though must watch standing tolerance) Patient left: in bed;with call bell/phone within reach (bed in chair position) Nurse Communication: Mobility status PT Visit Diagnosis: Unsteadiness on feet (R26.81);Other abnormalities of gait and mobility (R26.89);Muscle weakness (generalized) (M62.81);Pain;Other (comment)  (Decr functional capacity) Pain - Right/Left: Right Pain - part of body: Hip     Time: [id number] PT Time Calculation (min) (ACUTE ONLY): 43 min  Charges:  $Therapeutic Exercise: 8-22 mins $Therapeutic Activity: 23-37 mins                     [name], PT  Acute Rehabilitation Services Office [phone]    Rachel [place] 05/30/2022, 5:32 PM

## 2022-05-30 NOTE — Progress Notes (Signed)
PROGRESS NOTE    Rachel Santos  [id number] DOB: 08-05-1940 DOA: 05/26/2022 PCP: Charlane Ferretti, MD    Brief Narrative:   Rachel Santos is a 82 y.o. female with past medical history significant for paroxysmal atrial fibrillation on Eliquis, severe aortic stenosis s/p TAVR, mitral/tricuspid valve disease s/p repair, CAD, history of CVA with severe left MCA stenosis, CKD stage IIIa, type 2 diabetes mellitus, essential hypertension, hyperlipidemia, depression/anxiety, dementia who presented to [facility] ED on 2/29 complaining of right hip pain after fall at home.  Patient currently lives alone with daughter checking on her twice daily.  Life alert was activated and EMS had to forcibly enter her residence.  Approximately down for 30 minutes.  Denies loss of consciousness.  Patient was given IV fentanyl and transferred by EMS to ED for further evaluation.  In the ED, temperature 97.3 F, HR 74, RR 11, BP 132/68, SpO2 98% on room air.  Sodium 139, potassium 4.0, chloride 102, CO2 24, glucose 152, BUN 26, creatinine 0.78.  AST 28, ALT 30, total bilirubin 0.5.  CK 43, high sensitive troponin 10 followed by 10.  Lactic acid 2.1.  WBC 10.2, hemoglobin 14.5, platelets 198.  EtOH level less than 10.  CT head without contrast with no acute intracranial process, mild diffuse atrophy.  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  Chest x-ray with no active cardiopulmonary disease process.  Right hip/pelvis x-ray with mildly displaced and comminuted intratrochanteric fracture of the right hip, old fracture of the superior pubic ramus on the right and inferior pubic ramus on the left.  Right knee x-ray with no acute fracture or dislocation, severe tricompartmental degenerative changes, small suprapatellar joint effusion with loose  bodies.  Orthopedics was consulted.  TRH consulted for admission for further evaluation management of acute right hip fracture.  Assessment & Plan:   Displaced/comminuted intratrochanteric fracture right hip Patient presenting to the ED via EMS after mechanical fall at home with immediate right hip pain and inability to ambulate. Right hip/pelvis x-ray with mildly displaced and comminuted intratrochanteric fracture of the right hip.  Orthopedics was consulted and patient underwent cephalomedullary nailing of right hip by Dr. Doreatha Martin on 05/28/2022. -- Orthopedics following, appreciate assistance -- Vitamin D 25 hydroxy level 45.53 -- WBAT RLE -- Norco 1-2 tablets every 6 hours as needed moderate pain -- Morphine 0.5 mg IV every 2 hours as needed severe pain -- Robaxin 5 mg p.o./IV every 6 hours as needed muscle spasms -- Restart home Eliquis 2.5 mg PO BID for postoperative DVT prophylaxis -- PT/OT evaluation with recommendations of SNF placement, TOC consulted  Postoperative blood loss anemia Hemoglobin 12.5 on admission, postoperatively down to 8.9; likely in the setting of long bone fracture. -- Hgb 12.5>8.9>7.6 -- Transfuse 1 unit PRBC today, H&H following transfusion -- Restarted Eliquis as above -- CBC daily -- Transfuse for hemoglobin less than 8.0 given cardiac history  Paroxysmal atrial fibrillation on Eliquis -- Eliquis 2.'5mg'$  PO BID -- Monitor on telemetry  Severe aortic stenosis s/p TAVR Mitral/tricuspid valve disease s/p repair, CAD Chronic diastolic congestive heart failure, compensated Hx essential hypertension TTE 05/27/2022 with LVEF 65 to 70%, no LV regional wall motion normalities, concentric LVH, severe PVR, moderate/severe AR, noted TAVR valve in aortic position, trivial MR, mild mitral stenosis, noted mitral/tricuspid valve repair.   Currently not on antihypertensives outpatient. --  Cardiology following, appreciate assistance -- Cardiology currently deferring TEE and  recommends repeat TTE in 3-6 months  Hx of CVA with severe left MCA stenosis Hx TIA Recent MR brain 05/24/2022 with no acute findings. -- Continue statin, Eliquis, aspirin  CKD stage IIIa Creatinine 0.70, at baseline; stable  Type 2 diabetes mellitus with hyperglycemia Home regimen includes Levemir 28 units BID, Farxiga.  Hemoglobin A1c 8.3 on 10/17/2021, not optimally controlled. -- Levemir 10 units [place] twice daily -- Sensitive SSI for coverage -- CBGs qAC/HS  Hyperlipidemia -- Atorvastatin 80 mg p.o. daily  Depression/anxiety -- Venlafaxine XR to 25 mg p.o. daily  Dementia --Delirium precautions --Get up during the day --Encourage a familiar face to remain present throughout the day --Keep blinds open and lights on during daylight hours --Minimize the use of opioids/benzodiazepines  DVT prophylaxis: apixaban (ELIQUIS) tablet 2.5 mg Start: 05/29/22 1000 SCDs Start: 05/28/22 1028 SCDs Start: 05/26/22 2310 apixaban (ELIQUIS) tablet 2.5 mg    Code Status: Full Code Family Communication: Daughter updated at bedside this morning  Disposition Plan:  Level of care: Telemetry Medical Status is: Inpatient Remains inpatient appropriate because: Pending SNF placement, transfusing 1 unit PRBC today    Consultants:  Orthopedics Cardiology  Procedures:  TTE:   Antimicrobials:  Perioperative cefazolin   Subjective: Patient seen examined bedside, resting comfortably.  Lying in bed.  Hemoglobin dropped to 7.6 this morning, discussed with patient and daughter at bedside will transfuse 1 unit [facility] given her underlying cardiac comorbidities.  Plan for discharge to SNF, TOC consulted.  No other questions or concerns at this time.  Denies headache, no dizziness, no chest pain, no shortness of breath, no abdominal pain, no fever, no nausea/vomiting/diarrhea. No acute events overnight per nursing staff.  Objective: Vitals:   05/29/22 2003 05/29/22 2342 05/30/22 0435 05/30/22 0744  BP:  (!) 129/36 (!) 134/42 (!) 129/39 (!) 126/49  Pulse: 72 75 76 68  Resp:  '16 18 16  '$ Temp: 98.1 F (36.7 C) 99.5 F (37.5 C) 98.4 F (36.9 C) 98.6 F (37 C)  TempSrc: Oral Oral Oral Oral  SpO2: 99% 99% 100% 100%  Weight:      Height:        Intake/Output Summary (Last 24 hours) at 05/30/2022 1034 Last data filed at 05/30/2022 0740 Gross per 24 hour  Intake 1140.66 ml  Output 1000 ml  Net 140.66 ml   Filed Weights   05/26/22 2031 05/27/22 0216  Weight: 66.2 kg 60.3 kg    Examination:  Physical Exam: GEN: NAD, alert and oriented to place (hospital), (person (self and identifies her daughter in the room), but not oriented to time HEENT: NCAT, PERRL, EOMI, sclera clear, MMM PULM: CTAB w/o wheezes/crackles, normal respiratory effort, on room air CV: RRR w/o M/G/R GI: abd soft, NTND, NABS, no R/G/M MSK: no peripheral edema, right lower extremity with surgical dressing in place, clean/dry/intact, no significant ecchymosis, no erythema/fluctuance NEURO: CN II-XII intact, no focal deficits, sensation to light touch intact PSYCH: normal mood/affect Integumentary: dry/intact, no rashes or wounds    Data Reviewed: I have personally reviewed following labs and imaging studies  CBC: Recent Labs  Lab 05/23/22 2023 05/23/22 2031 05/26/22 2025 05/26/22 2037 05/28/22 0446 05/29/22 0443 05/30/22 0307  WBC 7.9  --  10.2  --  11.2* 10.0 10.2  NEUTROABS 4.7  --   --   --   --   --   --   HGB 14.3   < > 14.5 14.3  12.5 8.9* 7.6*  HCT 44.5   < > 43.1 42.0 37.3 26.3* 23.9*  MCV 91.9  --  89.0  --  89.2 90.1 93.4  PLT 198  --  198  --  160 134* 134*   < > = values in this interval not displayed.   Basic Metabolic Panel: Recent Labs  Lab 05/23/22 2023 05/23/22 2031 05/26/22 2025 05/26/22 2037 05/26/22 2233 05/28/22 0446 05/29/22 0443  NA 140 140 139 140  --  136 136  K 3.7 3.7 4.0 4.1  --  4.3 4.0  CL 105 106 102 105  --  99 104  CO2 23  --  24  --   --  25 22  GLUCOSE 99 99  152* 147*  --  138* 196*  BUN 27* 27* 26* 26*  --  29* 36*  CREATININE 0.80 0.70 0.78 0.70  --  0.85 0.84  CALCIUM 9.1  --  9.5  --   --  8.7* 8.1*  MG  --   --   --   --  1.9 2.0  --   PHOS  --   --   --   --  3.4  --   --    GFR: Estimated Creatinine Clearance: 48.3 mL/min (by C-G formula based on SCr of 0.84 mg/dL). Liver Function Tests: Recent Labs  Lab 05/23/22 2023 05/26/22 2025  AST 31 28  ALT 35 30  ALKPHOS 72 79  BILITOT 0.5 0.5  PROT 6.7 6.7  ALBUMIN 3.6 3.7   No results for input(s): "LIPASE", "AMYLASE" in the last 168 hours. No results for input(s): "AMMONIA" in the last 168 hours. Coagulation Profile: Recent Labs  Lab 05/23/22 2023 05/26/22 2025  INR 1.1 1.1   Cardiac Enzymes: Recent Labs  Lab 05/26/22 2025  CKTOTAL 43   BNP (last 3 results) No results for input(s): "PROBNP" in the last 8760 hours. HbA1C: No results for input(s): "HGBA1C" in the last 72 hours.  CBG: Recent Labs  Lab 05/29/22 0744 05/29/22 1150 05/29/22 1555 05/29/22 2339 05/30/22 0830  GLUCAP 168* 200* 253* 236* 193*   Lipid Profile: No results for input(s): "CHOL", "HDL", "LDLCALC", "TRIG", "CHOLHDL", "LDLDIRECT" in the last 72 hours. Thyroid Function Tests: No results for input(s): "TSH", "T4TOTAL", "FREET4", "T3FREE", "THYROIDAB" in the last 72 hours.  Anemia Panel: No results for input(s): "VITAMINB12", "FOLATE", "FERRITIN", "TIBC", "IRON", "RETICCTPCT" in the last 72 hours. Sepsis Labs: Recent Labs  Lab 05/26/22 2025 05/26/22 2320 05/27/22 0115  LATICACIDVEN 2.1* 1.5 0.9    Recent Results (from the past 240 hour(s))  Surgical pcr screen     Status: Abnormal   Collection Time: 05/28/22  7:16 AM   Specimen: Nasal Mucosa; Nasal Swab  Result Value Ref Range Status   MRSA, PCR NEGATIVE NEGATIVE Final   Staphylococcus aureus POSITIVE (A) NEGATIVE Final    Comment: (NOTE) The Xpert SA Assay (FDA approved for NASAL specimens in patients 66 years of age and older),  is one component of a comprehensive surveillance program. It is not intended to diagnose infection nor to guide or monitor treatment. Performed at [facility], [place] [address], [place], [place] [address]          Radiology Studies: DG HIP PORT [facility] OR W/O PELVIS 1V RIGHT  Result Date: 05/28/2022 CLINICAL DATA:  Fracture, postop. EXAM: DG HIP (WITH OR WITHOUT PELVIS) 1V PORT RIGHT COMPARISON:  Preoperative radiographs. FINDINGS: Intramedullary nail with trans trochanteric and distal  locking screw fixation traverse proximal femur fracture. Improved fracture alignment from preoperative imaging. Recent postsurgical change includes air and edema in the soft tissues. Vascular stents are seen. Remote pubic rami fractures. IMPRESSION: ORIF right proximal femur fracture. No immediate postoperative complication. Electronically Signed   By: Keith Rake M.D.   On: 05/28/2022 11:42        Scheduled Meds:  sodium chloride   Intravenous Once   apixaban  2.5 mg Oral BID   aspirin EC  81 mg Oral Daily   atorvastatin  80 mg Oral Daily   Chlorhexidine Gluconate Cloth  6 each Topical Q0600   cyanocobalamin  500 mcg Oral Q Mon   dapagliflozin propanediol  10 mg Oral Daily   docusate sodium  100 mg Oral BID   insulin aspart  0-9 Units Subcutaneous TID WC   insulin detemir  10 Units Subcutaneous BID   multivitamin with minerals  1 tablet Oral Daily   mupirocin ointment  1 Application Nasal BID   pantoprazole  80 mg Oral Daily   venlafaxine XR  225 mg Oral Q breakfast   Continuous Infusions:  sodium chloride 50 mL/hr at 05/30/22 [id number]   methocarbamol (ROBAXIN) IV       LOS: 4 days    Time spent: 48 minutes spent on chart review, discussion with nursing staff, consultants, updating family and interview/physical exam; more than 50% of that time was spent in counseling and/or coordination of care.    Rachel Heimann J [place] DO [facility] Available via Epic secure chat  7am-7pm After these hours, please refer to coverage provider listed on [email or web address] 05/30/2022, 10:34 AM

## 2022-05-30 NOTE — Progress Notes (Signed)
   05/30/22 1938  Assess: MEWS Score  Temp 98.5 F (36.9 C)  BP (!) 142/128  MAP (mmHg) 135  Pulse Rate (!) 112  Resp 18  Level of Consciousness Alert  SpO2 100 %  O2 Device Nasal Cannula  O2 Flow Rate (L/min) 2 L/min  Assess: MEWS Score  MEWS Temp 0  MEWS Systolic 0  MEWS Pulse 2  MEWS RR 0  MEWS LOC 0  MEWS Score 2  MEWS Score Color Yellow  Assess: if the MEWS score is Yellow or Red  Were vital signs taken at a resting state? Yes  Focused Assessment No change from prior assessment  Does the patient meet 2 or more of the SIRS criteria? No  MEWS guidelines implemented  Yes, yellow  Treat  MEWS Interventions Considered administering scheduled or prn medications/treatments as ordered  Take Vital Signs  Increase Vital Sign Frequency  Yellow: Q2hr x1, continue Q4hrs until patient remains green for 12hrs  Escalate  MEWS: Escalate Yellow: Discuss with charge nurse and consider notifying provider and/or RRT  Notify: Charge Nurse/RN  Name of Charge Nurse/RN Notified Dauda RN  Assess: SIRS CRITERIA  SIRS Temperature  0  SIRS Pulse 1  SIRS Respirations  0  SIRS WBC 0  SIRS Score Sum  1   Pt has a known hx of Afib. Prn meds given, and bed changed.  Pt assessed and is at baseline. Clarene Essex, provider on call, also notified and asked to page him back if HR does not come down.

## 2022-05-30 NOTE — Progress Notes (Signed)
Orthopaedic Trauma Progress Note  SUBJECTIVE: Doing well this morning.  More awake and alert.  Answering questions appropriately.  No chest pain. No SOB. No nausea/vomiting. No other complaints.  Daughter at bedside.  States patient was scheduled to have a appointment with neurology tomorrow.  Asking if neurology can see patient while in the hospital.  OBJECTIVE:  Vitals:   05/30/22 0435 05/30/22 0744  BP: (!) 129/39 (!) 126/49  Pulse: 76 68  Resp: 18 16  Temp: 98.4 F (36.9 C) 98.6 F (37 C)  SpO2: 100% 100%    General: Sitting up in bed drinking her coffee.  No acute distress Respiratory: No increased work of breathing.  RLE: Dressings removed, incisions are clean, dry, intact.  No significant tenderness with palpation of the hip or throughout the thigh.  Tolerates gentle ankle range of motion.  Able to wiggle toes.  Sensation intact to light touch over all aspects of the foot. + DP pulse  IMAGING: Stable post op imaging.   LABS:  Results for orders placed or performed during the hospital encounter of 05/26/22 (from the past 24 hour(s))  Glucose, capillary     Status: Abnormal   Collection Time: 05/29/22 11:50 AM  Result Value Ref Range   Glucose-Capillary 200 (H) 70 - 99 mg/dL  Glucose, capillary     Status: Abnormal   Collection Time: 05/29/22  3:55 PM  Result Value Ref Range   Glucose-Capillary 253 (H) 70 - 99 mg/dL  Glucose, capillary     Status: Abnormal   Collection Time: 05/29/22 11:39 PM  Result Value Ref Range   Glucose-Capillary 236 (H) 70 - 99 mg/dL  CBC     Status: Abnormal   Collection Time: 05/30/22  3:07 AM  Result Value Ref Range   WBC 10.2 4.0 - 10.5 K/uL   RBC 2.56 (L) 3.87 - 5.11 MIL/uL   Hemoglobin 7.6 (L) 12.0 - 15.0 g/dL   HCT 23.9 (L) 36.0 - 46.0 %   MCV 93.4 80.0 - 100.0 fL   MCH 29.7 26.0 - 34.0 pg   MCHC 31.8 30.0 - 36.0 g/dL   RDW 14.4 11.5 - 15.5 %   Platelets 134 (L) 150 - 400 K/uL   nRBC 0.0 0.0 - 0.2 %  Type and screen [place]     Status: None (Preliminary result)   Collection Time: 05/30/22  7:46 AM  Result Value Ref Range   ABO/RH(D) A NEG    Antibody Screen NEG    Sample Expiration      06/02/2022,2359 Performed at [facility], [place] [address] [place], [place] [address]    Unit Number [id number]    Blood Component Type RBC LR PHER1    Unit division 00    Status of Unit ALLOCATED    Transfusion Status OK TO TRANSFUSE    Crossmatch Result Compatible   Prepare RBC (crossmatch)     Status: None   Collection Time: 05/30/22  7:46 AM  Result Value Ref Range   Order Confirmation      ORDERS RECEIVED TO CROSSMATCH Performed at [facility] [address] [place], [place] [address]   Glucose, capillary     Status: Abnormal   Collection Time: 05/30/22  8:30 AM  Result Value Ref Range   Glucose-Capillary 193 (H) 70 - 99 mg/dL   Comment 1 Notify RN     ASSESSMENT: Rachel Santos is a 82 y.o. female, 2 Days Post-Op s/p  INTRAMEDULLARY NAILING RIGHT INTERTROCHANTERIC FEMUR FRACTURE  CV/Blood loss: Acute blood loss anemia, Hgb 7.6 this morning. Hemodynamically stable.  1 unit PRBCs ordered per primary team.  PLAN: Weightbearing: WBAT RLE ROM: Ok for unrestricted ROM  Incisional and dressing care: Dressings removed today.  Continue dressing changes as needed.  Okay to leave incisions open to air if no drainage Showering: Ok to begin showering/getting incisions wet 05/31/22 Orthopedic device(s): None  Pain management:  1. Tylenol 325-650 mg q 6 hours PRN 2. Robaxin 500 mg q 6 hours PRN 3. Norco 5-325 mg q 4 hours PRN 4. Morphine 0.5 mg q 2 hours PRN VTE prophylaxis:  Eliquis , SCDs ID:  Ancef 2gm post op completed Foley/Lines:  No foley, KVO IVFs Impediments to Fracture Healing: Vit D level 45, no additional supplementation needed Dispo: PT/OT evaluation ongoing, currently recommending SNF.  Family agreeable.  TOC following for bed placement.  Continue to monitor  CBC.  D/C recommendations: - Norco for pain control --signed and placed in patient's chart - Homse dose Eliquis for DVT prophylaxis - No need for additional Vit D supplementation  Follow - up plan: 2 weeks   Contact information:  Katha Hamming MD, Rushie Nyhan PA-C. After hours and holidays please check [email or web address] for group call information for [facility]   Gwinda Passe, PA-C [phone] (office) [email or web address]

## 2022-05-31 ENCOUNTER — Inpatient Hospital Stay: Payer: Medicare Other | Admitting: Psychiatry

## 2022-05-31 DIAGNOSIS — E1165 Type 2 diabetes mellitus with hyperglycemia: Secondary | ICD-10-CM | POA: Diagnosis not present

## 2022-05-31 LAB — TYPE AND SCREEN
ABO/RH(D): A NEG
Antibody Screen: NEGATIVE
Unit division: 0

## 2022-05-31 LAB — CBC
HCT: 26.3 % — ABNORMAL LOW (ref 36.0–46.0)
Hemoglobin: 8.6 g/dL — ABNORMAL LOW (ref 12.0–15.0)
MCH: 29.9 pg (ref 26.0–34.0)
MCHC: 32.7 g/dL (ref 30.0–36.0)
MCV: 91.3 fL (ref 80.0–100.0)
Platelets: 146 10*3/uL — ABNORMAL LOW (ref 150–400)
RBC: 2.88 MIL/uL — ABNORMAL LOW (ref 3.87–5.11)
RDW: 14.3 % (ref 11.5–15.5)
WBC: 8.3 10*3/uL (ref 4.0–10.5)
nRBC: 0 % (ref 0.0–0.2)

## 2022-05-31 LAB — BPAM RBC
Blood Product Expiration Date: 202403142359
ISSUE DATE / TIME: 202403041045
Unit Type and Rh: 600

## 2022-05-31 LAB — GLUCOSE, CAPILLARY
Glucose-Capillary: 107 mg/dL — ABNORMAL HIGH (ref 70–99)
Glucose-Capillary: 110 mg/dL — ABNORMAL HIGH (ref 70–99)
Glucose-Capillary: 150 mg/dL — ABNORMAL HIGH (ref 70–99)
Glucose-Capillary: 192 mg/dL — ABNORMAL HIGH (ref 70–99)

## 2022-05-31 MED ORDER — SENNOSIDES-DOCUSATE SODIUM 8.6-50 MG PO TABS
1.0000 | ORAL_TABLET | Freq: Two times a day (BID) | ORAL | Status: DC
  Administered 2022-05-31 – 2022-06-02 (×4): 1 via ORAL
  Filled 2022-05-31 (×4): qty 1

## 2022-05-31 MED ORDER — POLYETHYLENE GLYCOL 3350 17 G PO PACK
17.0000 g | PACK | Freq: Two times a day (BID) | ORAL | Status: DC
  Administered 2022-05-31 – 2022-06-02 (×4): 17 g via ORAL
  Filled 2022-05-31 (×4): qty 1

## 2022-05-31 MED ORDER — SODIUM CHLORIDE 0.9 % IV SOLN: INTRAVENOUS | Status: AC

## 2022-05-31 MED ORDER — BISACODYL 10 MG RE SUPP: 10.0000 mg | Freq: Every day | RECTAL | Status: DC | PRN

## 2022-05-31 MED ORDER — METOPROLOL TARTRATE 12.5 MG HALF TABLET
12.5000 mg | ORAL_TABLET | Freq: Two times a day (BID) | ORAL | Status: DC
  Administered 2022-05-31 (×2): 12.5 mg via ORAL
  Filled 2022-05-31 (×4): qty 1

## 2022-05-31 NOTE — Progress Notes (Signed)
Physical Therapy Treatment Patient Details Name: Rachel Santos MRN: [id number] DOB: January 14, 1941 Today's Date: 05/31/2022   History of Present Illness 82 yo female presenting to ED on 2/29 with R hip pain after fall at home. S/p Cephalomedullary nailing of right intertrochanteric femur fracture on 3/2. PMH including paroxysmal atrial fibrillation on Eliquis, severe aortic stenosis s/p TAVR, mitral/tricuspid valve disease s/p repair, CAD, history of CVA with severe left MCA stenosis, CKD stage IIIa, type 2 diabetes mellitus, essential hypertension, hyperlipidemia, depression/anxiety, dementia    PT Comments    Continuing work on functional mobility and activity tolerance;  Rachel Santos was more lethargic today than yesterday's session, and limited in her activity tolerance; seemed similar to initial PT eval when she became far less responsive after standing and transferring to the recliner; Opted to obtain supine and sitting BPs, and ultimately used the bed into chair position function to get into sitting for more upright BP;  as follows:    05/31/22 1300  Orthostatic Lying   BP- Lying 115/52  Pulse- Lying 90  Orthostatic Sitting  BP- Sitting 122/44  Pulse- Sitting 70 (bed in chair position)     Recommendations for follow up therapy are one component of a multi-disciplinary discharge planning process, led by the attending physician.  Recommendations may be updated based on patient status, additional functional criteria and insurance authorization.  Follow Up Recommendations  Skilled nursing-short term rehab (<3 hours/day) Can patient physically be transported by private vehicle: No   Assistance Recommended at Discharge Frequent or constant Supervision/Assistance  Patient can return home with the following Two people to help with walking and/or transfers;Two people to help with bathing/dressing/bathroom;Assistance with cooking/housework;Direct supervision/assist for medications management;Direct  supervision/assist for financial management;Help with stairs or ramp for entrance   Equipment Recommendations  Rolling walker (2 wheels);BSC/3in1    Recommendations for Other Services  Worth considering Palliative Medicine Consult     Precautions / Restrictions Precautions Precautions: Fall Precaution Comments: Monitor BP Restrictions RLE Weight Bearing: Weight bearing as tolerated     Mobility  Bed Mobility Overal bed mobility: Needs Assistance Bed Mobility: Rolling, Supine to Sit Rolling: Max assist, +2 for physical assistance, +2 for safety/equipment   Supine to sit: Max assist, HOB elevated (Used bed to chair positioning)     General bed mobility comments: Less responsive compared to yesterday's session, with eyes closed for 70% of session; 2 person assist to roll L and R for hygeine and positioning; Opted to use bed function to chair position for getting BP in sitting, as pt did not tolerate much movement, and BP very soft in supine    Transfers                        Ambulation/Gait                   Stairs             Wheelchair Mobility    Modified Rankin (Stroke Patients Only)       Balance                                            Cognition Arousal/Alertness: Lethargic Behavior During Therapy: Flat affect Overall Cognitive Status: History of cognitive impairments - at baseline  General Comments: Dementia at baseline        Exercises      General Comments General comments (skin integrity, edema, etc.): Daughter Rachel Santos present and helpful      Pertinent Vitals/Pain Pain Assessment Pain Assessment: Faces Faces Pain Scale: Hurts even more Pain Location: RLE, with movement Pain Descriptors / Indicators: Discomfort, Grimacing Pain Intervention(s): Monitored during session    Home Living                          Prior Function             PT Goals (current goals can now be found in the care plan section) Acute Rehab PT Goals Patient Stated Goal: Unable to state PT Goal Formulation: Patient unable to participate in goal setting Time For Goal Achievement: 06/12/22 Potential to Achieve Goals: Fair Progress towards PT goals: Not progressing toward goals - comment (less responsive thsi session)    Frequency    Min 3X/week      PT Plan Current plan remains appropriate    Co-evaluation              AM-PAC PT "6 Clicks" Mobility   Outcome Measure  Help needed turning from your back to your side while in a flat bed without using bedrails?: A Lot Help needed moving from lying on your back to sitting on the side of a flat bed without using bedrails?: Total Help needed moving to and from a bed to a chair (including a wheelchair)?: Total Help needed standing up from a chair using your arms (e.g., wheelchair or bedside chair)?: Total Help needed to walk in hospital room?: Total Help needed climbing 3-5 steps with a railing? : Total 6 Click Score: 7    End of Session   Activity Tolerance: Patient limited by lethargy Patient left: in bed;with call bell/phone within reach;with nursing/sitter in room (bed in chair poistion)   PT Visit Diagnosis: Unsteadiness on feet (R26.81);Other abnormalities of gait and mobility (R26.89);Muscle weakness (generalized) (M62.81);Pain;Other (comment) (Decr functional capacity) Pain - Right/Left: Right Pain - part of body: Hip     Time: 1350-1410 PT Time Calculation (min) (ACUTE ONLY): 20 min  Charges:  $Therapeutic Activity: 8-22 mins                     Rachel Santos, PT  Acute Rehabilitation Services Office [phone]   Rachel Santos 05/31/2022, 4:13 PM

## 2022-05-31 NOTE — Progress Notes (Addendum)
PROGRESS NOTE    Rachel Santos  [id number] DOB: [birth year] DOA: 05/26/2022 PCP: Charlane Ferretti, MD    Brief Narrative:   Rachel Santos is a 82 y.o. female with past medical history significant for paroxysmal atrial fibrillation on Eliquis, severe aortic stenosis s/p TAVR, mitral/tricuspid valve disease s/p repair, CAD, history of CVA with severe left MCA stenosis, CKD stage IIIa, type 2 diabetes mellitus, essential hypertension, hyperlipidemia, depression/anxiety, dementia who presented to [facility] ED on 2/29 complaining of right hip pain after fall at home.  Patient currently lives alone with daughter checking on her twice daily.  Life alert was activated and EMS had to forcibly enter her residence.  Approximately down for 30 minutes.  Denies loss of consciousness.  Patient was given IV fentanyl and transferred by EMS to ED for further evaluation.  In the ED, temperature 97.3 F, HR 74, RR 11, BP 132/68, SpO2 98% on room air.  Sodium 139, potassium 4.0, chloride 102, CO2 24, glucose 152, BUN 26, creatinine 0.78.  AST 28, ALT 30, total bilirubin 0.5.  CK 43, high sensitive troponin 10 followed by 10.  Lactic acid 2.1.  WBC 10.2, hemoglobin 14.5, platelets 198.  EtOH level less than 10.  CT head without contrast with no acute intracranial process, mild diffuse atrophy.  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  Chest x-ray with no active cardiopulmonary disease process.  Right hip/pelvis x-ray with mildly displaced and comminuted intratrochanteric fracture of the right hip, old fracture of the superior pubic ramus on the right and inferior pubic ramus on the left.  Right knee x-ray with no acute fracture or dislocation, severe tricompartmental degenerative changes, small suprapatellar joint effusion with loose  bodies.  Orthopedics was consulted.  TRH consulted for admission for further evaluation management of acute right hip fracture.  Assessment & Plan:   Displaced/comminuted intratrochanteric fracture right hip Patient presenting to the ED via EMS after mechanical fall at home with immediate right hip pain and inability to ambulate. Right hip/pelvis x-ray with mildly displaced and comminuted intratrochanteric fracture of the right hip.  Orthopedics was consulted and patient underwent cephalomedullary nailing of right hip by Dr. Doreatha Martin on 05/28/2022. -- Orthopedics following, appreciate assistance -- Vitamin D 25 hydroxy level 45.53 -- WBAT RLE -- Norco 1-2 tablets every 6 hours as needed moderate pain -- Morphine 0.5 mg IV every 2 hours as needed severe pain -- Robaxin 5 mg p.o./IV every 6 hours as needed muscle spasms -- Restarted home Eliquis 2.5 mg PO BID for postoperative DVT prophylaxis -- Continue therapy efforts while inpatient, pending SNF placement per TOC; medically stable for discharge once bed available  Postoperative blood loss anemia Hemoglobin 12.5 on admission, postoperatively down to 8.9; likely in the setting of long bone fracture.  Transfuse 1 unit PRBC on 3/4 -- Hgb 12.5>8.9>7.6>8.3>8.6 -- Restarted Eliquis as above -- CBC daily -- Transfuse for hemoglobin less than 8.0 given cardiac history  Paroxysmal atrial fibrillation on Eliquis -- Eliquis 2.'5mg'$  PO BID --Started metoprolol tartrate 12.5 mg p.o. twice daily for elevated heart rate; not on any rate trolling medications outpatient -- Monitor on telemetry  Severe aortic stenosis s/p TAVR Mitral/tricuspid valve disease s/p repair, CAD Chronic diastolic congestive heart failure, compensated Hx essential hypertension TTE 05/27/2022 with LVEF 65 to 70%, no LV regional wall motion normalities, concentric LVH, severe PVR,  moderate/severe AR, noted TAVR valve in aortic position, trivial MR, mild mitral stenosis, noted  mitral/tricuspid valve repair.   Currently not on antihypertensives outpatient. -- Cardiology following, appreciate assistance -- Cardiology currently deferring TEE and recommends repeat TTE in 3-6 months  Hx of CVA with severe left MCA stenosis Hx TIA Recent MR brain 05/24/2022 with no acute findings. -- Continue statin, Eliquis, aspirin  CKD stage IIIa Creatinine 0.70, at baseline; stable  Type 2 diabetes mellitus with hyperglycemia Home regimen includes Levemir 28 units BID, Farxiga.  Hemoglobin A1c 8.3 on 10/17/2021, not optimally controlled. -- Levemir 15 units Helen twice daily -- Sensitive SSI for coverage -- CBGs qAC/HS  Hyperlipidemia -- Atorvastatin 80 mg p.o. daily  Depression/anxiety -- Venlafaxine XR to 25 mg p.o. daily  Dementia --Delirium precautions --Get up during the day --Encourage a familiar face to remain present throughout the day --Keep blinds open and lights on during daylight hours --Minimize the use of opioids/benzodiazepines  DVT prophylaxis: apixaban (ELIQUIS) tablet 2.5 mg Start: 05/29/22 1000 SCDs Start: 05/28/22 1028 SCDs Start: 05/26/22 2310 apixaban (ELIQUIS) tablet 2.5 mg    Code Status: Full Code Family Communication: No family present at bedside this morning  Disposition Plan:  Level of care: Telemetry Medical Status is: Inpatient Remains inpatient appropriate because: Pending SNF placement, medically stable for discharge once bed available    Consultants:  Orthopedics Cardiology  Procedures:  TTE:   Antimicrobials:  Perioperative cefazolin   Subjective: Patient seen examined bedside, resting comfortably.  Lying in bed.  Transfuse 1 unit PRBC yesterday, hemoglobin up to 8.6 today; stable.  No other questions or concerns at this time.  Denies headache, no dizziness, no chest pain, no shortness of breath, no abdominal pain, no fever, no nausea/vomiting/diarrhea. No acute events overnight per nursing staff.  Medically stable for  discharge to SNF once bed available.  Addendum: Heart rate elevated this afternoon, 100-1 17.  Not on any rate controlling medications at baseline.  Will give IV fluid hydration plus start metoprolol tartrate.  Continue to monitor over the next 24 hours, if remains stable can discharge to SNF tomorrow.  Objective: Vitals:   05/31/22 0000 05/31/22 0200 05/31/22 0607 05/31/22 0810  BP:  (!) 146/100 (!) 111/46 (!) 114/49  Pulse: (!) 105 96 100 (!) 107  Resp: '18 18 17 16  '$ Temp:  98.2 F (36.8 C) 98.2 F (36.8 C) 98.4 F (36.9 C)  TempSrc:  Oral Oral Oral  SpO2:  94% 97% 100%  Weight:      Height:        Intake/Output Summary (Last 24 hours) at 05/31/2022 1058 Last data filed at 05/31/2022 0645 Gross per 24 hour  Intake 913.64 ml  Output 1000 ml  Net -86.36 ml   Filed Weights   05/26/22 2031 05/27/22 0216  Weight: 66.2 kg 60.3 kg    Examination:  Physical Exam: GEN: NAD, alert and oriented to place (hospital), person (self), but not oriented to time HEENT: NCAT, PERRL, EOMI, sclera clear, MMM PULM: CTAB w/o wheezes/crackles, normal respiratory effort, on room air CV: RRR w/o M/G/R GI: abd soft, NTND, NABS, no R/G/M MSK: no peripheral edema, right lower extremity with ecchymosis, no erythema/fluctuance noted to surgical incision sites NEURO: CN II-XII intact, no focal deficits, sensation to light touch intact PSYCH: normal mood/affect Integumentary: Right lower extremity surgical site as above, no other concerning rashes/lesions/wounds noted on exposed skin surfaces    Data Reviewed: I have personally reviewed following labs and imaging studies  CBC: Recent Labs  Lab 05/26/22 2025 05/26/22 2037 05/28/22 0446 05/29/22 0443 05/30/22 0307 05/30/22 1745 05/31/22 0315  WBC 10.2  --  11.2* 10.0 10.2  --  8.3  HGB 14.5   < > 12.5 8.9* 7.6* 8.3* 8.6*  HCT 43.1   < > 37.3 26.3* 23.9* 25.2* 26.3*  MCV 89.0  --  89.2 90.1 93.4  --  91.3  PLT 198  --  160 134* 134*  --  146*    < > = values in this interval not displayed.   Basic Metabolic Panel: Recent Labs  Lab 05/26/22 2025 05/26/22 2037 05/26/22 2233 05/28/22 0446 05/29/22 0443  NA 139 140  --  136 136  K 4.0 4.1  --  4.3 4.0  CL 102 105  --  99 104  CO2 24  --   --  25 22  GLUCOSE 152* 147*  --  138* 196*  BUN 26* 26*  --  29* 36*  CREATININE 0.78 0.70  --  0.85 0.84  CALCIUM 9.5  --   --  8.7* 8.1*  MG  --   --  1.9 2.0  --   PHOS  --   --  3.4  --   --    GFR: Estimated Creatinine Clearance: 48.3 mL/min (by C-G formula based on SCr of 0.84 mg/dL). Liver Function Tests: Recent Labs  Lab 05/26/22 2025  AST 28  ALT 30  ALKPHOS 79  BILITOT 0.5  PROT 6.7  ALBUMIN 3.7   No results for input(s): "LIPASE", "AMYLASE" in the last 168 hours. No results for input(s): "AMMONIA" in the last 168 hours. Coagulation Profile: Recent Labs  Lab 05/26/22 2025  INR 1.1   Cardiac Enzymes: Recent Labs  Lab 05/26/22 2025  CKTOTAL 43   BNP (last 3 results) No results for input(s): "PROBNP" in the last 8760 hours. HbA1C: No results for input(s): "HGBA1C" in the last 72 hours.  CBG: Recent Labs  Lab 05/30/22 1135 05/30/22 1605 05/30/22 1938 05/31/22 0002 05/31/22 0811  GLUCAP 264* 278* 279* 192* 110*   Lipid Profile: No results for input(s): "CHOL", "HDL", "LDLCALC", "TRIG", "CHOLHDL", "LDLDIRECT" in the last 72 hours. Thyroid Function Tests: No results for input(s): "TSH", "T4TOTAL", "FREET4", "T3FREE", "THYROIDAB" in the last 72 hours.  Anemia Panel: No results for input(s): "VITAMINB12", "FOLATE", "FERRITIN", "TIBC", "IRON", "RETICCTPCT" in the last 72 hours. Sepsis Labs: Recent Labs  Lab 05/26/22 2025 05/26/22 2320 05/27/22 0115  LATICACIDVEN 2.1* 1.5 0.9    Recent Results (from the past 240 hour(s))  Surgical pcr screen     Status: Abnormal   Collection Time: 05/28/22  7:16 AM   Specimen: Nasal Mucosa; Nasal Swab  Result Value Ref Range Status   MRSA, PCR NEGATIVE  NEGATIVE Final   Staphylococcus aureus POSITIVE (A) NEGATIVE Final    Comment: (NOTE) The Xpert SA Assay (FDA approved for NASAL specimens in patients 92 years of age and older), is one component of a comprehensive surveillance program. It is not intended to diagnose infection nor to guide or monitor treatment. Performed at [facility], [place] [address] [place], [place] [address]          Radiology Studies: No results found.      Scheduled Meds:  apixaban  2.5 mg Oral BID   aspirin EC  81 mg Oral Daily   atorvastatin  80 mg Oral Daily   Chlorhexidine Gluconate Cloth  6 each Topical Q0600   cyanocobalamin  500 mcg Oral Q Mon   dapagliflozin propanediol  10 mg Oral Daily   docusate sodium  100 mg Oral BID   insulin aspart  0-9 Units Subcutaneous TID WC   insulin detemir  15 Units Subcutaneous BID   multivitamin with minerals  1 tablet Oral Daily   mupirocin ointment  1 Application Nasal BID   pantoprazole  80 mg Oral Daily   venlafaxine XR  225 mg Oral Q breakfast   Continuous Infusions:  sodium chloride 50 mL/hr at 05/30/22 [id number]   methocarbamol (ROBAXIN) IV       LOS: 5 days    Time spent: 48 minutes spent on chart review, discussion with nursing staff, consultants, updating family and interview/physical exam; more than 50% of that time was spent in counseling and/or coordination of care.    Parsa Rickett J [place] DO [facility] Available via Epic secure chat 7am-7pm After these hours, please refer to coverage provider listed on [email or web address] 05/31/2022, 10:58 AM

## 2022-05-31 NOTE — TOC Progression Note (Signed)
Transition of Care [facility]) - Progression Note    Patient Details  Name: ALISANDRA KROLIKOWSKI MRN: [id number] Date of Birth: [birth year]  Transition of [facility] CM/SW [place], [place] Phone Number: 05/31/2022, 11:53 AM  Clinical Narrative:    CSW provided bed offers to pt and dtr at bedside. Dtr reviewed with her siblings, and they chose [facility]. Authorization approved, bed secured. Dtr to go to facility to sign paperwork and tour. MD to hold pt one more day for observation of ongoing symptoms, facility and family notified. TOC will continue to follow for DC needs.    Expected Discharge Plan: [place] Barriers to Discharge: Continued Medical Work up, SNF Pending bed offer, Ship broker  Expected Discharge Plan and [place] Choice: [place] Living arrangements for the past 2 months: Single Family Home                                       Social Determinants of Health (SDOH) Interventions SDOH Screenings   Food Insecurity: No Food Insecurity (12/20/2021)  Housing: Low Risk  (12/20/2021)  Transportation Needs: No Transportation Needs (12/20/2021)  Utilities: Not At Risk (12/20/2021)  Depression (PHQ2-9): Low Risk  (03/27/2019)  Tobacco Use: Unknown (05/30/2022)    Readmission Risk Interventions    10/19/2021   12:59 PM  Readmission Risk Prevention Plan  Post Dischage Appt Complete  Medication Screening Complete  Transportation Screening Complete

## 2022-05-31 NOTE — Plan of Care (Signed)

## 2022-06-01 ENCOUNTER — Encounter (HOSPITAL_COMMUNITY): Payer: Self-pay | Admitting: Internal Medicine

## 2022-06-01 ENCOUNTER — Inpatient Hospital Stay (HOSPITAL_COMMUNITY): Payer: Medicare Other

## 2022-06-01 LAB — URINALYSIS, W/ REFLEX TO CULTURE (INFECTION SUSPECTED)
Bilirubin Urine: NEGATIVE
Glucose, UA: >=500 mg/dL — AB
Ketones, ur: NEGATIVE mg/dL
Nitrite: NEGATIVE
Protein, ur: NEGATIVE mg/dL
Specific Gravity, Urine: 1.023 (ref 1.005–1.030)
pH: 5 (ref 5.0–8.0)

## 2022-06-01 LAB — CBC
HCT: 25.7 % — ABNORMAL LOW (ref 36.0–46.0)
Hemoglobin: 8.3 g/dL — ABNORMAL LOW (ref 12.0–15.0)
MCH: 30 pg (ref 26.0–34.0)
MCHC: 32.3 g/dL (ref 30.0–36.0)
MCV: 92.8 fL (ref 80.0–100.0)
Platelets: 184 10*3/uL (ref 150–400)
RBC: 2.77 MIL/uL — ABNORMAL LOW (ref 3.87–5.11)
RDW: 14.7 % (ref 11.5–15.5)
WBC: 9.4 10*3/uL (ref 4.0–10.5)
nRBC: 0.3 % — ABNORMAL HIGH (ref 0.0–0.2)

## 2022-06-01 LAB — GLUCOSE, CAPILLARY
Glucose-Capillary: 91 mg/dL (ref 70–99)
Glucose-Capillary: 113 mg/dL — ABNORMAL HIGH (ref 70–99)
Glucose-Capillary: 115 mg/dL — ABNORMAL HIGH (ref 70–99)
Glucose-Capillary: 133 mg/dL — ABNORMAL HIGH (ref 70–99)

## 2022-06-01 MED ORDER — SODIUM CHLORIDE 0.9 % IV SOLN
500.0000 mg | INTRAVENOUS | Status: DC
  Administered 2022-06-01 – 2022-06-02 (×2): 500 mg via INTRAVENOUS
  Filled 2022-06-01 (×3): qty 5

## 2022-06-01 MED ORDER — SODIUM CHLORIDE 0.9 % IV SOLN
2.0000 g | INTRAVENOUS | Status: DC
  Administered 2022-06-01 – 2022-06-02 (×2): 2 g via INTRAVENOUS
  Filled 2022-06-01 (×2): qty 20

## 2022-06-01 NOTE — Hospital Course (Signed)
82 y.o. female with past medical history significant for paroxysmal atrial fibrillation on Eliquis, severe aortic stenosis s/p TAVR, mitral/tricuspid valve disease s/p repair, CAD, history of CVA with severe left MCA stenosis, CKD stage IIIa, type 2 diabetes mellitus, essential hypertension, hyperlipidemia, depression/anxiety, dementia who presented to [facility] ED on 2/29 complaining of right hip pain after fall at home.  Patient currently lives alone with daughter checking on her twice daily.  Life alert was activated and EMS had to forcibly enter her residence.  Approximately down for 30 minutes.  Denies loss of consciousness.  Patient was given IV fentanyl and transferred by EMS to ED for further evaluation.   In the ED, temperature 97.3 F, HR 74, RR 11, BP 132/68, SpO2 98% on room air.  Sodium 139, potassium 4.0, chloride 102, CO2 24, glucose 152, BUN 26, creatinine 0.78.  AST 28, ALT 30, total bilirubin 0.5.  CK 43, high sensitive troponin 10 followed by 10.  Lactic acid 2.1.  WBC 10.2, hemoglobin 14.5, platelets 198.  EtOH level less than 10.  CT head without contrast with no acute intracranial process, mild diffuse atrophy.  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  Chest x-ray with no active cardiopulmonary disease process.  Right hip/pelvis x-ray with mildly displaced and comminuted intratrochanteric fracture of the right hip, old fracture of the superior pubic ramus on the right and inferior pubic ramus on the left.  Right knee x-ray with no acute fracture or dislocation, severe tricompartmental degenerative changes, small suprapatellar joint effusion with loose bodies.  Orthopedics was consulted.  TRH consulted for admission for further evaluation management of acute right hip fracture.

## 2022-06-01 NOTE — Progress Notes (Signed)
Occupational Therapy Treatment Patient Details Name: Rachel Santos MRN: [id number] DOB: 03/17/1941 Today's Date: 06/01/2022   History of present illness 82 yo female presenting to ED on 2/29 with R hip pain after fall at home. S/p Cephalomedullary nailing of right intertrochanteric femur fracture on 3/2. PMH including paroxysmal atrial fibrillation on Eliquis, severe aortic stenosis s/p TAVR, mitral/tricuspid valve disease s/p repair, CAD, history of CVA with severe left MCA stenosis, CKD stage IIIa, type 2 diabetes mellitus, essential hypertension, hyperlipidemia, depression/anxiety, dementia   OT comments  Pt progressing towards established OT goals. Pt continues to be orthostatic with transitions to standing. Pt with good motivation to mobilize and daughter present/supportive. Pt pulling up L sock at bed level without A. Pt performing Step pivot transfer with mod A +2. Decr verbal responsiveness during transfer, so likely orthostatic, but waited to take additional BP after sitting for pt safety. Pt resting comfortably in chair at end of session with daughter present and VSS. Continue to recommend SNF for continued OT services.    Recommendations for follow up therapy are one component of a multi-disciplinary discharge planning process, led by the attending physician.  Recommendations may be updated based on patient status, additional functional criteria and insurance authorization.    Follow Up Recommendations  Skilled nursing-short term rehab (<3 hours/day)     Assistance Recommended at Discharge Frequent or constant Supervision/Assistance  Patient can return home with the following  Two people to help with walking and/or transfers;Two people to help with bathing/dressing/bathroom   Equipment Recommendations  BSC/3in1    Recommendations for Other Services PT consult    Precautions / Restrictions Precautions Precautions: Fall Precaution Comments: Monitor BP Restrictions Weight  Bearing Restrictions: No RLE Weight Bearing: Weight bearing as tolerated       Mobility Bed Mobility Overal bed mobility: Needs Assistance Bed Mobility: Rolling, Supine to Sit     Supine to sit: Max assist, HOB elevated     General bed mobility comments: Following commands to participate in bed mobility; step by step cues for sequencing. Assist for RLE movement to EOB and trunk elevation.    Transfers Overall transfer level: Needs assistance Equipment used: Rolling walker (2 wheels) Transfers: Sit to/from Stand Sit to Stand: Mod assist, +2 safety/equipment     Step pivot transfers: Mod assist, +2 physical assistance, +2 safety/equipment     General transfer comment: Stood from bedside with heavy mod assist to power up; daughter present and encouraging     Balance Overall balance assessment: Needs assistance Sitting-balance support: No upper extremity supported, Feet supported Sitting balance-Leahy Scale: Fair     Standing balance support: During functional activity, Bilateral upper extremity supported Standing balance-Leahy Scale: Poor                             ADL either performed or assessed with clinical judgement   ADL Overall ADL's : Needs assistance/impaired                     Lower Body Dressing: Moderate assistance;Bed level Lower Body Dressing Details (indicate cue type and reason): Able to don L sock, but in too much pain to don R sock Toilet Transfer: Moderate assistance;+2 for physical assistance;Stand-pivot;Rolling walker (2 wheels) Toilet Transfer Details (indicate cue type and reason): simulated to recliner         Functional mobility during ADLs: Moderate assistance;+2 for physical assistance General ADL Comments: SPT  Extremity/Trunk Assessment Upper Extremity Assessment Upper Extremity Assessment: Generalized weakness   Lower Extremity Assessment Lower Extremity Assessment: Defer to PT evaluation        Vision        Perception     Praxis      Cognition Arousal/Alertness: Awake/alert, Lethargic Behavior During Therapy: Flat affect Overall Cognitive Status: History of cognitive impairments - at baseline                                 General Comments: Dementia at baseline        Exercises      Shoulder Instructions       General Comments daughter, Rachel Santos present and supportive. BPs: 122/79 (93) in bed on arrival;n 110/44 (63) EOB after ~2 mins; 108/64 (77) seated in recliner after transfer; 135/44 (68) after seated for several minutes.    Pertinent Vitals/ Pain       Pain Assessment Pain Assessment: Faces Faces Pain Scale: Hurts even more Pain Location: RLE, with movement Pain Descriptors / Indicators: Discomfort, Grimacing Pain Intervention(s): Limited activity within patient's tolerance, Monitored during session, Repositioned  Home Living                                          Prior Functioning/Environment              Frequency  Min 2X/week        Progress Toward Goals  OT Goals(current goals can now be found in the care plan section)  Progress towards OT goals: Progressing toward goals  Acute Rehab OT Goals Patient Stated Goal: get stronger OT Goal Formulation: With patient/family Time For Goal Achievement: 06/12/22 Potential to Achieve Goals: Good ADL Goals Pt Will Perform Lower Body Dressing: with mod assist;with caregiver independent in assisting;sit to/from stand Pt Will Transfer to Toilet: with min assist;stand pivot transfer;bedside commode Pt Will Perform Toileting - Clothing Manipulation and hygiene: with min assist;sit to/from stand;sitting/lateral leans Additional ADL Goal #1: Pt will perform bed mobility with Min A in preparation for ADLs  Plan Discharge plan remains appropriate;Frequency remains appropriate    Co-evaluation    PT/OT/SLP Co-Evaluation/Treatment: Yes Reason for Co-Treatment: For  patient/therapist safety;To address functional/ADL transfers PT goals addressed during session: Mobility/safety with mobility OT goals addressed during session: ADL's and self-care      AM-PAC OT "6 Clicks" Daily Activity     Outcome Measure   Help from another person eating meals?: A Little Help from another person taking care of personal grooming?: A Little Help from another person toileting, which includes using toliet, bedpan, or urinal?: A Lot Help from another person bathing (including washing, rinsing, drying)?: A Lot Help from another person to put on and taking off regular upper body clothing?: A Lot Help from another person to put on and taking off regular lower body clothing?: A Lot 6 Click Score: 14    End of Session Equipment Utilized During Treatment: Rolling walker (2 wheels);Gait belt  OT Visit Diagnosis: Unsteadiness on feet (R26.81);Other abnormalities of gait and mobility (R26.89);Muscle weakness (generalized) (M62.81);Pain Pain - Right/Left: Right Pain - part of body: Leg   Activity Tolerance Patient tolerated treatment well   Patient Left in chair;with call bell/phone within reach;with chair alarm set;with family/visitor present   Nurse Communication  Time: 1258 [id number] OT Time Calculation (min): 30 min  Charges: OT General Charges $OT Visit: 1 Visit OT Treatments $Self Care/Home Management : 8-22 mins  Elder Cyphers, OTR/L [facility] Acute Rehabilitation Office: [phone]   Magnus Ivan 06/01/2022, 3:11 PM

## 2022-06-01 NOTE — Progress Notes (Signed)
Physical Therapy Treatment Patient Details Name: Rachel Santos MRN: [id number] DOB: 02-04-1941 Today's Date: 06/01/2022   History of Present Illness 82 yo female presenting to ED on 2/29 with R hip pain after fall at home. S/p Cephalomedullary nailing of right intertrochanteric femur fracture on 3/2. PMH including paroxysmal atrial fibrillation on Eliquis, severe aortic stenosis s/p TAVR, mitral/tricuspid valve disease s/p repair, CAD, history of CVA with severe left MCA stenosis, CKD stage IIIa, type 2 diabetes mellitus, essential hypertension, hyperlipidemia, depression/anxiety, dementia    PT Comments    Continuing work on functional mobility and activity tolerance;  Much better alertness and ability to participate than previous session (likely lethargy last session due to meds?); able to get to EOB with 2 person assist, and perform step pivot transfer bed to recliner with heavy mod assist of 2; seemed less interactive in standing, but unable to get standing BP; Overall improving activity tolerance (see vitals flowsheets as well)  Recommendations for follow up therapy are one component of a multi-disciplinary discharge planning process, led by the attending physician.  Recommendations may be updated based on patient status, additional functional criteria and insurance authorization.  Follow Up Recommendations  Skilled nursing-short term rehab (<3 hours/day) Can patient physically be transported by private vehicle: No   Assistance Recommended at Discharge Frequent or constant Supervision/Assistance  Patient can return home with the following Two people to help with walking and/or transfers;Two people to help with bathing/dressing/bathroom;Assistance with cooking/housework;Direct supervision/assist for medications management;Direct supervision/assist for financial management;Help with stairs or ramp for entrance   Equipment Recommendations  Rolling walker (2 wheels);BSC/3in1     Recommendations for Other Services       Precautions / Restrictions Precautions Precautions: Fall Precaution Comments: Monitor BP Restrictions Weight Bearing Restrictions: No RLE Weight Bearing: Weight bearing as tolerated     Mobility  Bed Mobility Overal bed mobility: Needs Assistance Bed Mobility: Rolling, Supine to Sit     Supine to sit: Max assist, HOB elevated     General bed mobility comments: Following commands to participate in bed mobility; step by step cues for sequencing. Assist for RLE movement to EOB and trunk elevation.    Transfers Overall transfer level: Needs assistance Equipment used: Rolling walker (2 wheels) Transfers: Sit to/from Stand Sit to Stand: Mod assist, +2 safety/equipment   Step pivot transfers: Mod assist, +2 physical assistance, +2 safety/equipment       General transfer comment: Stood from bedside with heavy mod assist to power up; daughter present and encouraging    Ambulation/Gait                   Stairs             Wheelchair Mobility    Modified Rankin (Stroke Patients Only)       Balance Overall balance assessment: Needs assistance Sitting-balance support: No upper extremity supported, Feet supported Sitting balance-Leahy Scale: Fair     Standing balance support: During functional activity, Bilateral upper extremity supported Standing balance-Leahy Scale: Poor                              Cognition Arousal/Alertness: Awake/alert, Lethargic Behavior During Therapy: Flat affect Overall Cognitive Status: History of cognitive impairments - at baseline                                 General Comments:  Dementia at baseline        Exercises      General Comments General comments (skin integrity, edema, etc.): daughter, Rachel Santos present and supportive. BPs: 122/79 (93) in bed on arrival;n 110/44 (63) EOB after ~2 mins; 108/64 (77) seated in recliner after transfer; 135/44  (68) after seated for several minutes.      Pertinent Vitals/Pain Pain Assessment Pain Assessment: Faces Faces Pain Scale: Hurts even more Pain Location: RLE, with movement Pain Descriptors / Indicators: Discomfort, Grimacing    Home Living                          Prior Function            PT Goals (current goals can now be found in the care plan section) Acute Rehab PT Goals Patient Stated Goal: Unable to state PT Goal Formulation: Patient unable to participate in goal setting Time For Goal Achievement: 06/12/22 Potential to Achieve Goals: Fair Progress towards PT goals: Progressing toward goals    Frequency    Min 3X/week      PT Plan Current plan remains appropriate    Co-evaluation   Reason for Co-Treatment: For patient/therapist safety;To address functional/ADL transfers PT goals addressed during session: Mobility/safety with mobility OT goals addressed during session: ADL's and self-care      AM-PAC PT "6 Clicks" Mobility   Outcome Measure  Help needed turning from your back to your side while in a flat bed without using bedrails?: A Lot Help needed moving from lying on your back to sitting on the side of a flat bed without using bedrails?: Total Help needed moving to and from a bed to a chair (including a wheelchair)?: Total Help needed standing up from a chair using your arms (e.g., wheelchair or bedside chair)?: Total Help needed to walk in hospital room?: Total Help needed climbing 3-5 steps with a railing? : Total 6 Click Score: 7    End of Session Equipment Utilized During Treatment: Gait belt Activity Tolerance: Patient tolerated treatment well Patient left: in chair;with call bell/phone within reach;with chair alarm set;with family/visitor present Nurse Communication: Mobility status PT Visit Diagnosis: Unsteadiness on feet (R26.81);Other abnormalities of gait and mobility (R26.89);Muscle weakness (generalized) (M62.81);Pain;Other  (comment) (Decr functional capacity) Pain - Right/Left: Right Pain - part of body: Hip     Time: [id number] PT Time Calculation (min) (ACUTE ONLY): 29 min  Charges:  $Therapeutic Activity: 8-22 mins                     [name], PT  Acute Rehabilitation Services Office [phone]    [name] 06/01/2022, 4:11 PM

## 2022-06-01 NOTE — Plan of Care (Signed)

## 2022-06-01 NOTE — TOC Progression Note (Signed)
Transition of Care [facility]) - Progression Note    Patient Details  Name: Rachel Santos MRN: [id number] Date of Birth: Mar 31, 1940  Transition of Care [facility]) CM/SW [place], [place] Phone Number: 06/01/2022, 10:43 AM  Clinical Narrative:     CSW notified that pt may not be ready to DC today. Facility notified and will continue to follow. Auth good through tomorrow. TOC will continue to follow for DC needs.  Expected Discharge Plan: [place] Barriers to Discharge: Continued Medical Work up, SNF Pending bed offer, Ship broker  Expected Discharge Plan and [place] Choice: [place] Living arrangements for the past 2 months: Single Family Home                                       Social Determinants of Health (SDOH) Interventions SDOH Screenings   Food Insecurity: No Food Insecurity (12/20/2021)  Housing: Low Risk  (12/20/2021)  Transportation Needs: No Transportation Needs (12/20/2021)  Utilities: Not At Risk (12/20/2021)  Depression (PHQ2-9): Low Risk  (03/27/2019)  Tobacco Use: Unknown (06/01/2022)    Readmission Risk Interventions    10/19/2021   12:59 PM  Readmission Risk Prevention Plan  Post Dischage Appt Complete  Medication Screening Complete  Transportation Screening Complete

## 2022-06-01 NOTE — Progress Notes (Signed)
Progress Note   Patient: Rachel Santos [id number] DOB: 01-12-1941 DOA: 05/26/2022     6 DOS: the patient was seen and examined on 06/01/2022   Brief hospital course: 82 y.o. female with past medical history significant for paroxysmal atrial fibrillation on Eliquis, severe aortic stenosis s/p TAVR, mitral/tricuspid valve disease s/p repair, CAD, history of CVA with severe left MCA stenosis, CKD stage IIIa, type 2 diabetes mellitus, essential hypertension, hyperlipidemia, depression/anxiety, dementia who presented to [facility] ED on 2/29 complaining of right hip pain after fall at home.  Patient currently lives alone with daughter checking on her twice daily.  Life alert was activated and EMS had to forcibly enter her residence.  Approximately down for 30 minutes.  Denies loss of consciousness.  Patient was given IV fentanyl and transferred by EMS to ED for further evaluation.   In the ED, temperature 97.3 F, HR 74, RR 11, BP 132/68, SpO2 98% on room air.  Sodium 139, potassium 4.0, chloride 102, CO2 24, glucose 152, BUN 26, creatinine 0.78.  AST 28, ALT 30, total bilirubin 0.5.  CK 43, high sensitive troponin 10 followed by 10.  Lactic acid 2.1.  WBC 10.2, hemoglobin 14.5, platelets 198.  EtOH level less than 10.  CT head without contrast with no acute intracranial process, mild diffuse atrophy.  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  Chest x-ray with no active cardiopulmonary disease process.  Right hip/pelvis x-ray with mildly displaced and comminuted intratrochanteric fracture of the right hip, old fracture of the superior pubic ramus on the right and inferior pubic ramus on the left.  Right knee x-ray with no acute fracture or dislocation, severe tricompartmental degenerative changes, small suprapatellar joint  effusion with loose bodies.  Orthopedics was consulted.  TRH consulted for admission for further evaluation management of acute right hip fracture.  Assessment and Plan: Displaced/comminuted intratrochanteric fracture right hip Patient presenting to the ED via EMS after mechanical fall at home with immediate right hip pain and inability to ambulate. Right hip/pelvis x-ray with mildly displaced and comminuted intratrochanteric fracture of the right hip.  Orthopedics was consulted and patient underwent cephalomedullary nailing of right hip by Dr. Doreatha Martin on 05/28/2022. -- Orthopedics following, appreciate assistance -- Vitamin D 25 hydroxy level 45.53 -- WBAT RLE -- Norco 1-2 tablets every 6 hours as needed moderate pain -- Morphine 0.5 mg IV every 2 hours as needed severe pain -- Robaxin 5 mg p.o./IV every 6 hours as needed muscle spasms -- Restarted home Eliquis 2.5 mg PO BID for postoperative DVT prophylaxis -- Plan for SNF at d/c   Postoperative blood loss anemia Hemoglobin 12.5 on admission, postoperatively down to 8.9; likely in the setting of long bone fracture.  Transfused 1 unit PRBC on 3/4 -- Hgb stable -- continue Eliquis as above -- CBC daily -- Plan transfuse for hemoglobin less than 8.0 given cardiac history   Paroxysmal atrial fibrillation on Eliquis -- Eliquis 2.'5mg'$  PO BID --Started metoprolol tartrate 12.5 mg p.o. twice daily for elevated heart rate; not on any rate trolling medications outpatient -- Monitor on telemetry   Severe aortic stenosis s/p TAVR Mitral/tricuspid valve disease s/p repair, CAD Chronic diastolic congestive heart failure, compensated Hx essential hypertension TTE 05/27/2022 with LVEF 65 to 70%, no LV regional wall motion normalities, concentric LVH, severe PVR, moderate/severe AR, noted TAVR valve in aortic position,  trivial MR, mild mitral stenosis, noted mitral/tricuspid valve repair.   Currently not on antihypertensives outpatient. -- Cardiology  following, appreciate assistance -- Cardiology currently deferring TEE and recommends repeat TTE in 3-6 months   Hx of CVA with severe left MCA stenosis Hx TIA Recent MR brain 05/24/2022 with no acute findings. -- Continue statin, Eliquis, aspirin   CKD stage IIIa Creatinine 0.70, at baseline; stable   Type 2 diabetes mellitus with hyperglycemia Home regimen includes Levemir 28 units BID, Farxiga.  Hemoglobin A1c 8.3 on 10/17/2021, not optimally controlled. -- Levemir 15 units [place] twice daily -- Sensitive SSI for coverage -- CBGs qAC/HS   Hyperlipidemia -- Atorvastatin 80 mg p.o. daily   Depression/anxiety -- Venlafaxine XR to 25 mg p.o. daily   Dementia --Delirium precautions --Get up during the day --Encourage a familiar face to remain present throughout the day --Keep blinds open and lights on during daylight hours --Minimize the use of opioids/benzodiazepines  Pneumonia -Fever to 100.28F overnight noted -UA unremarkable -Ordered and reviewed CXR. Findings concerning for patchy L basilar airspace opacities concerning for aspiration or developing PNA -Started empiric azithro and rocephin      Subjective: Pleasantly confused this AM  Physical Exam: Vitals:   05/31/22 1954 06/01/22 0237 06/01/22 0405 06/01/22 0759  BP: (!) 102/52 (!) 114/39 (!) 126/43 (!) 124/40  Pulse: 92 61 (!) 59 (!) 59  Resp: '18 18 20 17  '$ Temp: 99.1 F (37.3 C) (!) 100.6 F (38.1 C) 98.7 F (37.1 C) 98.3 F (36.8 C)  TempSrc: Oral Oral Oral Oral  SpO2: 99% 97% 100% 100%  Weight:      Height:       General exam: Awake, laying in bed, in nad Respiratory system: Normal respiratory effort, no wheezing Cardiovascular system: regular rate, s1, s2 Gastrointestinal system: Soft, nondistended, positive BS Central nervous system: CN2-12 grossly intact, strength intact Extremities: Perfused, no clubbing Skin: Normal skin turgor, no notable skin lesions seen Psychiatry: Mood normal // no visual  hallucinations   Data Reviewed:  Labs reviewed: WBC 9.4, Hgb 8.3, Plts 184   Family Communication: Pt in room, family at bedside  Disposition: Status is: Inpatient Remains inpatient appropriate because: Severity of illness  Planned Discharge Destination: Skilled nursing facility    Author: Marylu Lund, MD 06/01/2022 4:03 PM  For on call review [email or web address].

## 2022-06-02 DIAGNOSIS — M6281 Muscle weakness (generalized): Secondary | ICD-10-CM | POA: Diagnosis not present

## 2022-06-02 DIAGNOSIS — R2681 Unsteadiness on feet: Secondary | ICD-10-CM | POA: Diagnosis not present

## 2022-06-02 DIAGNOSIS — E08311 Diabetes mellitus due to underlying condition with unspecified diabetic retinopathy with macular edema: Secondary | ICD-10-CM | POA: Diagnosis not present

## 2022-06-02 DIAGNOSIS — R531 Weakness: Secondary | ICD-10-CM | POA: Diagnosis not present

## 2022-06-02 DIAGNOSIS — D510 Vitamin B12 deficiency anemia due to intrinsic factor deficiency: Secondary | ICD-10-CM | POA: Diagnosis not present

## 2022-06-02 DIAGNOSIS — Z794 Long term (current) use of insulin: Secondary | ICD-10-CM | POA: Diagnosis not present

## 2022-06-02 DIAGNOSIS — S72111D Displaced fracture of greater trochanter of right femur, subsequent encounter for closed fracture with routine healing: Secondary | ICD-10-CM | POA: Diagnosis not present

## 2022-06-02 DIAGNOSIS — I350 Nonrheumatic aortic (valve) stenosis: Secondary | ICD-10-CM | POA: Diagnosis not present

## 2022-06-02 DIAGNOSIS — Z4789 Encounter for other orthopedic aftercare: Secondary | ICD-10-CM | POA: Diagnosis not present

## 2022-06-02 DIAGNOSIS — D500 Iron deficiency anemia secondary to blood loss (chronic): Secondary | ICD-10-CM | POA: Diagnosis not present

## 2022-06-02 DIAGNOSIS — R2689 Other abnormalities of gait and mobility: Secondary | ICD-10-CM | POA: Diagnosis not present

## 2022-06-02 DIAGNOSIS — E119 Type 2 diabetes mellitus without complications: Secondary | ICD-10-CM | POA: Diagnosis not present

## 2022-06-02 DIAGNOSIS — S72001A Fracture of unspecified part of neck of right femur, initial encounter for closed fracture: Secondary | ICD-10-CM | POA: Diagnosis not present

## 2022-06-02 DIAGNOSIS — R1312 Dysphagia, oropharyngeal phase: Secondary | ICD-10-CM | POA: Diagnosis not present

## 2022-06-02 DIAGNOSIS — I129 Hypertensive chronic kidney disease with stage 1 through stage 4 chronic kidney disease, or unspecified chronic kidney disease: Secondary | ICD-10-CM | POA: Diagnosis not present

## 2022-06-02 DIAGNOSIS — D649 Anemia, unspecified: Secondary | ICD-10-CM | POA: Diagnosis not present

## 2022-06-02 DIAGNOSIS — N1831 Chronic kidney disease, stage 3a: Secondary | ICD-10-CM | POA: Diagnosis not present

## 2022-06-02 DIAGNOSIS — I480 Paroxysmal atrial fibrillation: Secondary | ICD-10-CM | POA: Diagnosis not present

## 2022-06-02 DIAGNOSIS — K219 Gastro-esophageal reflux disease without esophagitis: Secondary | ICD-10-CM | POA: Diagnosis not present

## 2022-06-02 DIAGNOSIS — I10 Essential (primary) hypertension: Secondary | ICD-10-CM | POA: Diagnosis not present

## 2022-06-02 DIAGNOSIS — D464 Refractory anemia, unspecified: Secondary | ICD-10-CM | POA: Diagnosis not present

## 2022-06-02 DIAGNOSIS — E118 Type 2 diabetes mellitus with unspecified complications: Secondary | ICD-10-CM | POA: Diagnosis not present

## 2022-06-02 DIAGNOSIS — E1165 Type 2 diabetes mellitus with hyperglycemia: Secondary | ICD-10-CM | POA: Diagnosis not present

## 2022-06-02 DIAGNOSIS — Z7401 Bed confinement status: Secondary | ICD-10-CM | POA: Diagnosis not present

## 2022-06-02 DIAGNOSIS — Z9181 History of falling: Secondary | ICD-10-CM | POA: Diagnosis not present

## 2022-06-02 DIAGNOSIS — I69891 Dysphagia following other cerebrovascular disease: Secondary | ICD-10-CM | POA: Diagnosis not present

## 2022-06-02 DIAGNOSIS — I69828 Other speech and language deficits following other cerebrovascular disease: Secondary | ICD-10-CM | POA: Diagnosis not present

## 2022-06-02 LAB — CBC
HCT: 24.6 % — ABNORMAL LOW (ref 36.0–46.0)
Hemoglobin: 8.2 g/dL — ABNORMAL LOW (ref 12.0–15.0)
MCH: 30.3 pg (ref 26.0–34.0)
MCHC: 33.3 g/dL (ref 30.0–36.0)
MCV: 90.8 fL (ref 80.0–100.0)
Platelets: 186 10*3/uL (ref 150–400)
RBC: 2.71 MIL/uL — ABNORMAL LOW (ref 3.87–5.11)
RDW: 14.8 % (ref 11.5–15.5)
WBC: 6.9 10*3/uL (ref 4.0–10.5)
nRBC: 0.3 % — ABNORMAL HIGH (ref 0.0–0.2)

## 2022-06-02 LAB — COMPREHENSIVE METABOLIC PANEL
ALT: 24 U/L (ref 0–44)
AST: 39 U/L (ref 15–41)
Albumin: 2 g/dL — ABNORMAL LOW (ref 3.5–5.0)
Alkaline Phosphatase: 54 U/L (ref 38–126)
Anion gap: 11 (ref 5–15)
BUN: 27 mg/dL — ABNORMAL HIGH (ref 8–23)
CO2: 23 mmol/L (ref 22–32)
Calcium: 8 mg/dL — ABNORMAL LOW (ref 8.9–10.3)
Chloride: 103 mmol/L (ref 98–111)
Creatinine, Ser: 0.77 mg/dL (ref 0.44–1.00)
GFR, Estimated: >60 mL/min (ref >60)
Glucose, Bld: 216 mg/dL — ABNORMAL HIGH (ref 70–99)
Potassium: 4.2 mmol/L (ref 3.5–5.1)
Sodium: 137 mmol/L (ref 135–145)
Total Bilirubin: 0.8 mg/dL (ref 0.3–1.2)
Total Protein: 4.9 g/dL — ABNORMAL LOW (ref 6.5–8.1)

## 2022-06-02 LAB — GLUCOSE, CAPILLARY
Glucose-Capillary: 114 mg/dL — ABNORMAL HIGH (ref 70–99)
Glucose-Capillary: 116 mg/dL — ABNORMAL HIGH (ref 70–99)

## 2022-06-02 MED ORDER — AZITHROMYCIN 250 MG PO TABS: ORAL_TABLET | ORAL | 0 refills | Status: DC

## 2022-06-02 MED ORDER — FERROUS SULFATE 325 (65 FE) MG PO TBEC: 325.0000 mg | DELAYED_RELEASE_TABLET | Freq: Two times a day (BID) | ORAL | 0 refills | Status: DC

## 2022-06-02 MED ORDER — INSULIN DETEMIR 100 UNIT/ML ~~LOC~~ SOLN: 15.0000 [IU] | Freq: Two times a day (BID) | SUBCUTANEOUS | 11 refills | Status: DC

## 2022-06-02 MED ORDER — SENNOSIDES-DOCUSATE SODIUM 8.6-50 MG PO TABS: 1.0000 | ORAL_TABLET | Freq: Two times a day (BID) | ORAL | 0 refills | Status: DC

## 2022-06-02 MED ORDER — CEFDINIR 300 MG PO CAPS: 300.0000 mg | ORAL_CAPSULE | Freq: Two times a day (BID) | ORAL | 0 refills | Status: AC

## 2022-06-02 NOTE — TOC Transition Note (Signed)
Transition of Care [facility]) - CM/SW Discharge Note   Patient Details  Name: Rachel Santos MRN: [id number] Date of Birth: [birth year]  Transition of Care [facility]) CM/SW Contact:  Coralee Pesa, [place] Phone Number: 06/02/2022, 10:04 AM   Clinical Narrative:     Pt to be transported to [facility] via [place]. Nurse to call report to [phone]. Rm# [id number]  Final next level of care: Skilled Nursing Facility Barriers to Discharge: Barriers Resolved   Patient Goals and CMS Choice CMS Medicare.gov Compare Post Acute Care list provided to:: Patient Represenative (must comment) (Daughter)    Discharge Placement                Patient [facility] bed at: [facility] Patient to be transferred to facility by: [place] Name of family member notified: [name] Patient and family notified of of transfer: 06/02/22  Discharge Plan and Services Additional resources added to the After Visit Summary for       Post Acute Care Choice: [place]                               Social Determinants of Health (SDOH) Interventions SDOH Screenings   Food Insecurity: No Food Insecurity (12/20/2021)  Housing: Low Risk  (12/20/2021)  Transportation Needs: No Transportation Needs (12/20/2021)  Utilities: Not At Risk (12/20/2021)  Depression (PHQ2-9): Low Risk  (03/27/2019)  Tobacco Use: Unknown (06/01/2022)     Readmission Risk Interventions    10/19/2021   12:59 PM  Readmission Risk Prevention Plan  Post Dischage Appt Complete  Medication Screening Complete  Transportation Screening Complete

## 2022-06-02 NOTE — TOC CM/SW Note (Cosign Needed)
Nurse ready for patient to be transported to [facility] . PTAR called, per Liliane Channel a hour and a half or so. Nurse aware

## 2022-06-02 NOTE — Discharge Summary (Signed)
Physician Discharge Summary   Patient: Rachel Santos MRN: [id number] DOB: [birth year]  Admit date:     05/26/2022  Discharge date: 06/02/22  Discharge Physician: Marylu Lund   PCP: Charlane Ferretti, MD   Recommendations at discharge:    Follow up with PCP in 1-2 weeks Follow up with Orthopedic Surgery in 2 weeks Recommend repeat CBC in 1-2 weeks Recommend repeat TTE in 3-6 months  Discharge Diagnoses: Principal Problem:   Closed right hip fracture ([place]) Active Problems:   PAF (paroxysmal atrial fibrillation) (HCC)   History of CVA (cerebrovascular accident)   Type 2 diabetes mellitus with complication, with long-term current use of insulin (HCC)   Chronic kidney disease, stage 3a (HCC)   S/P mitral valve repair   S/P TVR (tricuspid valve repair)   HTN (hypertension)   Fall   Lactic acidosis  Resolved Problems:   * No resolved hospital problems. *  Hospital Course: 82 y.o. female with past medical history significant for paroxysmal atrial fibrillation on Eliquis, severe aortic stenosis s/p TAVR, mitral/tricuspid valve disease s/p repair, CAD, history of CVA with severe left MCA stenosis, CKD stage IIIa, type 2 diabetes mellitus, essential hypertension, hyperlipidemia, depression/anxiety, dementia who presented to [facility] ED on 2/29 complaining of right hip pain after fall at home.  Patient currently lives alone with daughter checking on her twice daily.  Life alert was activated and EMS had to forcibly enter her residence.  Approximately down for 30 minutes.  Denies loss of consciousness.  Patient was given IV fentanyl and transferred by EMS to ED for further evaluation.   In the ED, temperature 97.3 F, HR 74, RR 11, BP 132/68, SpO2 98% on room air.  Sodium 139, potassium 4.0, chloride 102, CO2 24, glucose 152, BUN 26, creatinine 0.78.  AST 28, ALT 30, total bilirubin 0.5.  CK 43, high sensitive troponin 10 followed by 10.  Lactic acid 2.1.  WBC 10.2, hemoglobin 14.5, platelets 198.   EtOH level less than 10.  CT head without contrast with no acute intracranial process, mild diffuse atrophy.  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  Chest x-ray with no active cardiopulmonary disease process.  Right hip/pelvis x-ray with mildly displaced and comminuted intratrochanteric fracture of the right hip, old fracture of the superior pubic ramus on the right and inferior pubic ramus on the left.  Right knee x-ray with no acute fracture or dislocation, severe tricompartmental degenerative changes, small suprapatellar joint effusion with loose bodies.  Orthopedics was consulted.  TRH consulted for admission for further evaluation management of acute right hip fracture.  Assessment and Plan: Displaced/comminuted intratrochanteric fracture right hip Patient presenting to the ED via EMS after mechanical fall at home with immediate right hip pain and inability to ambulate. Right hip/pelvis x-ray with mildly displaced and comminuted intratrochanteric fracture of the right hip.  Orthopedics was consulted and patient underwent cephalomedullary nailing of right hip by Dr. Doreatha Martin on 05/28/2022. -- Orthopedics following, appreciate assistance -- Vitamin D 25 hydroxy level 45.53 -- WBAT RLE -- Norco 1-2 tablets every 6 hours as needed moderate pain -- Morphine 0.5 mg IV every 2 hours as needed severe pain -- Robaxin 5 mg p.o./IV every 6 hours as needed muscle spasms -- Restarted home Eliquis 2.5 mg PO BID for postoperative DVT prophylaxis -- Plan for SNF at d/c   Postoperative blood  loss anemia Hemoglobin 12.5 on admission, postoperatively down to 8.9; likely in the setting of long bone fracture.  Transfused 1 unit PRBC on 3/4 -- Hgb stable -- continue Eliquis as above -- No evidence of acute blood loss - Prescribed  iron supplementation - Recommend repeat cbc in 1-2 weeks   Paroxysmal atrial fibrillation on Eliquis -- Eliquis 2.'5mg'$  PO BID --Initially started metoprolol tartrate 12.5 mg p.o. twice daily for elevated heart rate; not on any rate trolling medications outpatient -- Later noted to be bradycardic and mildly hypotensive thus metoprolol stopped. Suspect pt was briefly in RVR secondary to below pneumonia diagnosis   Severe aortic stenosis s/p TAVR Mitral/tricuspid valve disease s/p repair, CAD Chronic diastolic congestive heart failure, compensated Hx essential hypertension TTE 05/27/2022 with LVEF 65 to 70%, no LV regional wall motion normalities, concentric LVH, severe PVR, moderate/severe AR, noted TAVR valve in aortic position, trivial MR, mild mitral stenosis, noted mitral/tricuspid valve repair.   Currently not on antihypertensives outpatient. -- Cardiology following, appreciate assistance -- Cardiology currently deferring TEE and recommends repeat TTE in 3-6 months   Hx of CVA with severe left MCA stenosis Hx TIA Recent MR brain 05/24/2022 with no acute findings. -- Continue statin, Eliquis, aspirin   CKD stage IIIa Creatinine remained stable   Type 2 diabetes mellitus with hyperglycemia Home regimen includes Levemir 28 units BID, Farxiga.  Hemoglobin A1c 8.3 on 10/17/2021, not optimally controlled. -- Levemir 15 units [place] twice daily -- Sensitive SSI for coverage while in hospital   Hyperlipidemia -- Atorvastatin 80 mg p.o. daily   Depression/anxiety -- Venlafaxine XR to 25 mg p.o. daily   Dementia --Delirium precautions --Get up during the day --Encourage a familiar face to remain present throughout the day --Keep blinds open and lights on during daylight hours --Minimize the use of opioids/benzodiazepines   Pneumonia -Fever to 100.25F noted on 3/6 AM -UA unremarkable -Ordered and reviewed CXR. Findings concerning for patchy L basilar airspace opacities concerning for  aspiration or developing PNA -Started empiric azithro and rocephin -Clinically improved after one day of abx. Plan to complete 3 more days of abx starting 3/8 to complete total of 5 days abx       Consultants: Orthopedic Surgery Procedures performed:   Disposition: Skilled nursing facility Diet recommendation:  Carb modified diet DISCHARGE MEDICATION: Allergies as of 06/02/2022       Reactions   Buspirone Hcl    lethargic/tired   Metformin And Related Diarrhea   Ozempic (0.25 Or 0.5 Mg-dose) [semaglutide(0.25 Or 0.'5mg'$ -dos)]    Other reaction(s): nausea   Ultram [tramadol] Nausea And Vomiting   Nickel Rash   Pt unable to wear jewelry made of nickel.   Pneumococcal Vaccines Swelling, Rash   At injection set        Medication List     STOP taking these medications    clopidogrel 75 MG tablet Commonly known as: Plavix       TAKE these medications    acetaminophen 500 MG tablet Commonly known as: TYLENOL Take 1,000 mg by mouth every 6 (six) hours as needed for moderate pain or headache.   apixaban 2.5 MG Tabs tablet Commonly known as: ELIQUIS Take 1 tablet (2.5 mg total) by mouth 2 (two) times daily.   aspirin EC 81 MG tablet Take 81 mg by mouth daily. Swallow whole.   atorvastatin 80 MG tablet Commonly known as: LIPITOR Take 1 tablet by mouth once daily   azithromycin 250 MG tablet Commonly known as:  Zithromax 1 tab po daily x 3 more days, zero refills Start taking on: June 03, 2022   CALCIUM 600 + D PO Take 1 tablet by mouth 2 (two) times daily.   cefdinir 300 MG capsule Commonly known as: OMNICEF Take 1 capsule (300 mg total) by mouth 2 (two) times daily for 3 days. Start taking on: June 03, 2022   dapagliflozin propanediol 10 MG Tabs tablet Commonly known as: FARXIGA Take 10 mg by mouth daily.   docusate sodium 100 MG capsule Commonly known as: COLACE Take 200 mg by mouth at bedtime.   HYDROcodone-acetaminophen 5-325 MG tablet Commonly  known as: NORCO/VICODIN Take 1 tablet by mouth every 6 (six) hours as needed for severe pain.   insulin detemir 100 UNIT/ML injection Commonly known as: LEVEMIR Inject 0.15 mLs (15 Units total) into the skin 2 (two) times daily. What changed: how much to take   lidocaine 5 % Commonly known as: LIDODERM Place 1 patch onto the skin daily. Remove & Discard patch within 12 hours or as directed by MD What changed:  when to take this reasons to take this additional instructions   multivitamin with minerals tablet Take 1 tablet by mouth daily.   nitroGLYCERIN 0.4 MG SL tablet Commonly known as: NITROSTAT Place 1 tablet (0.4 mg total) under the tongue every 5 (five) minutes as needed for chest pain.   omeprazole 40 MG capsule Commonly known as: PRILOSEC Take 40 mg by mouth daily before breakfast.   ondansetron 4 MG tablet Commonly known as: ZOFRAN Take 4 mg by mouth every 8 (eight) hours as needed for nausea or vomiting.   polyethylene glycol powder 17 GM/SCOOP powder Commonly known as: MiraLax Take 17 g by mouth 2 (two) times daily as needed for moderate constipation or mild constipation.   senna-docusate 8.6-50 MG tablet Commonly known as: Senokot-S Take 1 tablet by mouth 2 (two) times daily.   venlafaxine XR 75 MG 24 hr capsule Commonly known as: EFFEXOR-XR Take 75 mg by mouth daily with breakfast. Take with 150 mg capsule to equal 225 mg daily   venlafaxine XR 150 MG 24 hr capsule Commonly known as: EFFEXOR-XR Take 150 mg by mouth daily with breakfast. Take with 75 mg capsule to equal 225 mg daily   vitamin B-12 500 MCG tablet Commonly known as: CYANOCOBALAMIN Take 500 mcg by mouth every Monday.        Follow-up Information     Charlane Ferretti, MD Follow up in 1 week(s).   Specialty: Internal Medicine Why: Hospital follow up Contact information: [address] [address] [phone]         Shona Needles, MD Follow up in 2  week(s).   Specialty: Orthopedic Surgery Why: Hospital follow up Contact information: [place] [address] [phone]                Discharge Exam: Danley Danker Weights   05/26/22 2031 05/27/22 0216  Weight: 66.2 kg 60.3 kg   General exam: Awake, laying in bed, in nad Respiratory system: Normal respiratory effort, no wheezing Cardiovascular system: regular rate, s1, s2 Gastrointestinal system: Soft, nondistended, positive BS Central nervous system: CN2-12 grossly intact, strength intact Extremities: Perfused, no clubbing Skin: Normal skin turgor, no notable skin lesions seen Psychiatry: Mood normal // no visual hallucinations   Condition at discharge: fair  The results of significant diagnostics from this hospitalization (including imaging, microbiology, ancillary and laboratory) are listed below  for reference.   Imaging Studies: DG CHEST PORT 1 VIEW  Result Date: 06/01/2022 CLINICAL DATA:  Fever. EXAM: PORTABLE CHEST 1 VIEW COMPARISON:  05/26/2022. FINDINGS: 0753 hours. Stable postoperative changes median sternotomy, TAVR, mitral and tricuspid valve replacement and left atrial appendage clipping. Low lung volumes accentuate the pulmonary vasculature and cardiomediastinal silhouette. Patchy left basilar airspace opacities, concerning for aspiration or developing infection. No pleural effusion or pneumothorax. IMPRESSION: Patchy left basilar airspace opacities, concerning for aspiration or developing infection. Electronically Signed   By: Emmit Alexanders M.D.   On: 06/01/2022 08:58   ECHOCARDIOGRAM COMPLETE  Result Date: 05/29/2022    ECHOCARDIOGRAM REPORT   Patient Name:   Rachel Santos Date of Exam: 05/27/2022 Medical Rec #:  [id number]         Height:       67.0 in Accession #:    [id number]        Weight:       132.9 lb Date of Birth:  [birth year]        BSA:          1.700 m Patient Age:    56 years          BP:           117/59 mmHg Patient Gender: F                  HR:           73 bpm. Exam Location:  Inpatient Procedure: 2D Echo, 3D Echo, Cardiac Doppler and Color Doppler                                 MODIFIED REPORT:      This report was modified by Rudean Haskell MD on 05/29/2022 due to                       Clarification; Discussed with team.  Indications:     Z01.818 Encounter for other preprocedural examination  History:         Patient has prior history of Echocardiogram examinations, most                  recent 01/30/2020. Stroke, Aortic Valve Disease, Mitral Valve                  Disease and Tricuspid valve disease.; Risk Factors:Hypertension                  and Diabetes. Aortic stenosis. Mitral valve repair. Tricuspid                  valve repair. Aortic valve replacement.                  Aortic Valve: 23 mm Sapien prosthetic, stented (TAVR) valve is                  present in the aortic position. Procedure Date: 01/22/2019.                  Mitral Valve: 26 mm Memo prosthetic annuloplasty ring valve is                  present in the mitral position. Procedure Date: 10/11/2012.  Sonographer:     Roseanna Rainbow RDCS Referring Phys:  [place] Diagnosing Phys: Rudean Haskell MD IMPRESSIONS  1. There appears  to be paravaular leak. In the PSAX image series regurgitation looks significant but in the setting of prosthetic mitral valve artifact. The aortic valve has been repaired/replaced. Aortic valve regurgitation may be mild up to moderate but difficult to characterize on this study. There is a 23 mm Sapien prosthetic (TAVR) valve present in the aortic position. Procedure Date: 01/22/2019. Aortic valve mean gradient measures 13.0 mmHg.  2. The mitral valve has been repaired/replaced. Trivial mitral valve regurgitation. Mild mitral stenosis. The mean mitral valve gradient is 4.0 mmHg with average heart rate of 76 bpm. There is a 26 mm Memo prosthetic annuloplasty ring present in the mitral position. Procedure Date: 10/11/2012.  3. The  tricuspid valve is status post repair on 05/27/2013 with a 77m EMC3 annuloplasty ring.  4. Left ventricular ejection fraction, by estimation, is 65 to 70%. Left ventricular ejection fraction by 3D volume is 70 %. The left ventricle has normal function. The left ventricle has no regional wall motion abnormalities. There is moderate concentric left ventricular hypertrophy. Left ventricular diastolic parameters are indeterminate.  5. Right ventricular systolic function is hyperdynamic. The right ventricular size is normal. Conclusion(s)/Recommendation(s): Reviewed with team; planned for closer interval follow up. FINDINGS  Left Ventricle: Left ventricular ejection fraction, by estimation, is 65 to 70%. Left ventricular ejection fraction by 3D volume is 70 %. The left ventricle has normal function. The left ventricle has no regional wall motion abnormalities. The left ventricular internal cavity size was small. There is moderate concentric left ventricular hypertrophy. Left ventricular diastolic parameters are indeterminate. Right Ventricle: The right ventricular size is normal. Right vetricular wall thickness was not well visualized. Right ventricular systolic function is hyperdynamic. Left Atrium: Left atrial size was normal in size. Right Atrium: Right atrial size was normal in size. Pericardium: There is no evidence of pericardial effusion. Mitral Valve: 2D MVA 2.24 cm2. The mitral valve has been repaired/replaced. Trivial mitral valve regurgitation. There is a 26 mm Memo prosthetic annuloplasty ring present in the mitral position. Procedure Date: 10/11/2012. Mild mitral valve stenosis. MV peak gradient, 9.7 mmHg. The mean mitral valve gradient is 4.0 mmHg with average heart rate of 76 bpm. Tricuspid Valve: Tricuspid Valve mean gradient 1 mm Hg at a heart rate of 79 bpm. The tricuspid valve is has been repaired/replaced. Tricuspid valve regurgitation is not demonstrated. The tricuspid valve is status post repair with  an annuloplasty ring. Aortic Valve: The aortic valve has been repaired/replaced. Aortic valve regurgitation is moderate to severe. Aortic regurgitation PHT measures 182 msec. Aortic valve mean gradient measures 13.0 mmHg. Aortic valve peak gradient measures 24.8 mmHg. Aortic valve area, by VTI measures 2.40 cm. There is a 23 mm Sapien prosthetic, stented (TAVR) valve present in the aortic position. Procedure Date: 01/22/2019. Pulmonic Valve: The pulmonic valve was not well visualized. Pulmonic valve regurgitation is trivial. No evidence of pulmonic stenosis. Aorta: The aortic root and ascending aorta are structurally normal, with no evidence of dilitation. IAS/Shunts: No atrial level shunt detected by color flow Doppler.  LEFT VENTRICLE PLAX 2D LVIDd:         3.80 cm         Diastology LVIDs:         1.80 cm         LV e' medial:    4.79 cm/s LV PW:         1.10 cm         LV E/e' medial:  29.2 LV IVS:  1.30 cm         LV e' lateral:   7.40 cm/s LVOT diam:     2.20 cm         LV E/e' lateral: 18.9 LV SV:         101 LV SV Index:   59 LVOT Area:     3.80 cm        3D Volume EF                                LV 3D EF:    Left                                             ventricul LV Volumes (MOD)                            ar LV vol d, MOD    71.3 ml                    ejection A2C:                                        fraction LV vol d, MOD    51.5 ml                    by 3D A4C:                                        volume is LV vol s, MOD    17.8 ml                    70 %. A2C: LV vol s, MOD    12.6 ml A4C:                           3D Volume EF: LV SV MOD A2C:   53.5 ml       3D EF:        70 % LV SV MOD A4C:   51.5 ml       LV EDV:       105 ml LV SV MOD BP:    47.0 ml       LV ESV:       31 ml                                LV SV:        74 ml RIGHT VENTRICLE             IVC RV S prime:     14.60 cm/s  IVC diam: 1.60 cm TAPSE (M-mode): 1.1 cm LEFT ATRIUM             Index        RIGHT ATRIUM            Index LA diam:        3.20 cm 1.88 cm/m   RA Area:  15.10 cm LA Vol (A2C):   75.1 ml 44.18 ml/m  RA Volume:   37.20 ml  21.88 ml/m LA Vol (A4C):   39.8 ml 23.41 ml/m LA Biplane Vol: 54.2 ml 31.88 ml/m  AORTIC VALVE AV Area (Vmax):    2.49 cm AV Area (Vmean):   2.42 cm AV Area (VTI):     2.40 cm AV Vmax:           249.00 cm/s AV Vmean:          161.500 cm/s AV VTI:            0.421 m AV Peak Grad:      24.8 mmHg AV Mean Grad:      13.0 mmHg LVOT Vmax:         163.00 cm/s LVOT Vmean:        103.000 cm/s LVOT VTI:          0.266 m LVOT/AV VTI ratio: 0.63 AI PHT:            182 msec  AORTA Ao Root diam: 3.00 cm Ao Asc diam:  3.50 cm MITRAL VALVE MV Area (PHT): 2.91 cm     SHUNTS MV Area VTI:   2.11 cm     Systemic VTI:  0.27 m MV Peak grad:  9.7 mmHg     Systemic Diam: 2.20 cm MV Mean grad:  4.0 mmHg MV Vmax:       1.56 m/s MV Vmean:      97.5 cm/s MV Decel Time: 261 msec MV E velocity: 140.00 cm/s MV A velocity: 91.60 cm/s MV E/A ratio:  1.53 Rudean Haskell MD Electronically signed by Rudean Haskell MD Signature Date/Time: 05/27/2022/1:57:46 PM    Final (Updated)    DG HIP PORT UNILAT W OR W/O PELVIS 1V RIGHT  Result Date: 05/28/2022 CLINICAL DATA:  Fracture, postop. EXAM: DG HIP (WITH OR WITHOUT PELVIS) 1V PORT RIGHT COMPARISON:  Preoperative radiographs. FINDINGS: Intramedullary nail with trans trochanteric and distal locking screw fixation traverse proximal femur fracture. Improved fracture alignment from preoperative imaging. Recent postsurgical change includes air and edema in the soft tissues. Vascular stents are seen. Remote pubic rami fractures. IMPRESSION: ORIF right proximal femur fracture. No immediate postoperative complication. Electronically Signed   By: Keith Rake M.D.   On: 05/28/2022 11:42   DG HIP UNILAT WITH PELVIS 2-3 VIEWS RIGHT  Result Date: 05/28/2022 CLINICAL DATA:  Status post ORIF of proximal right femur. EXAM: DG HIP (WITH OR WITHOUT PELVIS) 2-3V RIGHT  COMPARISON:  05/26/2022 FINDINGS: Six images obtained portably in the operating room via C-arm radiography show open reduction and internal fixation of the intertrochanteric fracture of the proximal right femur. Placement of IM nail and hip screws noted. On the final image the hardware components and fracture fragments are in anatomic alignment. IMPRESSION: Status post ORIF of intertrochanteric fracture of the proximal right femur. Electronically Signed   By: Kerby Moors M.D.   On: 05/28/2022 10:45   DG C-Arm 1-60 Min-No Report  Result Date: 05/28/2022 Fluoroscopy was utilized by the requesting physician.  No radiographic interpretation.   CT CERVICAL SPINE WO CONTRAST  Result Date: 05/26/2022 CLINICAL DATA:  Fall.  Polytrauma, blunt EXAM: CT CERVICAL SPINE WITHOUT CONTRAST TECHNIQUE: Multidetector CT imaging of the cervical spine was performed without intravenous contrast. Multiplanar CT image reconstructions were also generated. RADIATION DOSE REDUCTION: This exam was performed according to the departmental dose-optimization program which includes automated exposure control, adjustment of the mA and/or  kV according to patient size and/or use of iterative reconstruction technique. COMPARISON:  CTA head neck 01/13/2021 FINDINGS: Alignment: Normal. Skull base and vertebrae: Craniocervical alignment is normal. The atlantodental interval is not widened. Degenerative changes are seen surrounding the odontoid process without significant associated canal stenosis. No acute fracture of the cervical spine. Vertebral body height is preserved. Soft tissues and spinal canal: No prevertebral fluid or swelling. No visible canal hematoma. Disc levels: There is diffuse intervertebral disc space narrowing, endplate remodeling, disc calcification throughout the cervical spine in keeping with changes of advanced degenerative disc disease. The spinal canal is widely patent. Multilevel uncovertebral and facet arthrosis  results in multilevel moderate to severe neuroforaminal narrowing, most severe on the right at C5-6. Upper chest: Negative. Other: 18 mm left thyroid nodule, stable since prior examination though mildly enlarged since remote prior examination of 09/27/2012. IMPRESSION: 1. No acute fracture or listhesis of the cervical spine. 2. 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 3. Stable left thyroid nodule. Recommend thyroid US (ref: J Am Coll Radiol. 2015 Feb;12(2): 143-50). Electronically Signed   By: Fidela Salisbury M.D.   On: 05/26/2022 21:24   CT HEAD WO CONTRAST  Result Date: 05/26/2022 CLINICAL DATA:  Trauma EXAM: CT HEAD WITHOUT CONTRAST TECHNIQUE: Contiguous axial images were obtained from the base of the skull through the vertex without intravenous contrast. RADIATION DOSE REDUCTION: This exam was performed according to the departmental dose-optimization program which includes automated exposure control, adjustment of the mA and/or kV according to patient size and/or use of iterative reconstruction technique. COMPARISON:  Head CT 05/23/2022 FINDINGS: Brain: No evidence of acute infarction, hemorrhage, hydrocephalus, extra-axial collection or mass lesion/mass effect. Again seen is mild diffuse atrophy. Vascular: Atherosclerotic calcifications are present within the cavernous internal carotid arteries. Skull: Normal. Negative for fracture or focal lesion. Sinuses/Orbits: No acute finding. Other: None. IMPRESSION: 1. No acute intracranial process. 2. Mild diffuse atrophy. Electronically Signed   By: Ronney Asters M.D.   On: 05/26/2022 21:16   DG Knee Right Port  Result Date: 05/26/2022 CLINICAL DATA:  Blunt trauma, fall. EXAM: PORTABLE RIGHT KNEE - 1-2 VIEW COMPARISON:  None Available. FINDINGS: No acute fracture or dislocation. Severe tricompartmental degenerative changes are noted  at the knee. There is a small suprapatellar joint effusion containing coarse calcifications, likely loose bodies. Vascular calcifications and vascular stent are noted in the soft tissues. IMPRESSION: 1. No acute fracture or dislocation. 2. Severe tricompartmental degenerative changes. 3. Small suprapatellar joint effusion with loose bodies. Electronically Signed   By: Brett Fairy M.D.   On: 05/26/2022 21:00   DG Hip Unilat W or Wo Pelvis 2-3 Views Right  Result Date: 05/26/2022 CLINICAL DATA:  Trauma, fall. EXAM: DG HIP (WITH OR WITHOUT PELVIS) 2-3V RIGHT COMPARISON:  12/19/2021. FINDINGS: There is a mildly displaced and comminuted intertrochanteric fracture of the proximal right femur. There are old fractures of the superior pubic ramus on the right and inferior pubic ramus on the left. No dislocation at the hips bilaterally. Mild degenerative changes are noted at the hips bilaterally and lower lumbar spine. Vascular calcifications and stent are noted in the right lower extremity. IMPRESSION: 1. Mildly displaced and comminuted intertrochanteric fracture of the right hip. 2. Old fractures of the superior pubic ramus on the right and inferior pubic ramus on the left. Electronically Signed   By: Brett Fairy M.D.   On: 05/26/2022  20:58   DG Chest Port 1 View  Result Date: 05/26/2022 CLINICAL DATA:  Level 2 trauma, fall. EXAM: PORTABLE CHEST 1 VIEW COMPARISON:  01/22/2019. FINDINGS: Heart is mildly enlarged and the mediastinal contour stable. There is atherosclerotic calcification of the aorta. A TAVR stent, prosthetic cardiac valves and left atrial appendage clip are noted. Lung volumes are low. No consolidation, effusion, or pneumothorax. Sternotomy wires are present over the midline. No acute osseous abnormality. IMPRESSION: No active disease. Electronically Signed   By: Brett Fairy M.D.   On: 05/26/2022 20:55   MR BRAIN WO CONTRAST  Result Date: 05/24/2022 CLINICAL DATA:  Initial evaluation for  neuro deficit, stroke. EXAM: MRI HEAD WITHOUT CONTRAST TECHNIQUE: Multiplanar, multiecho pulse sequences of the brain and surrounding structures were obtained without intravenous contrast. COMPARISON:  Prior study from 05/23/2022. FINDINGS: Brain: Mild age-related cerebral atrophy. Few scattered patchy T2/FLAIR hyperintense foci involving the periventricular and deep white matter both cerebral hemispheres, most likely related to chronic microvascular ischemic disease, mild in nature. Few scattered small remote bilateral cerebellar infarcts noted. No evidence for acute or subacute ischemia. Gray-white matter differentiation maintained. No areas of chronic cortical infarction. No acute or chronic intracranial blood products. No mass lesion, midline shift or mass effect. No hydrocephalus or extra-axial fluid collection. Pituitary gland and suprasellar region within normal limits. Vascular: Major intracranial vascular flow voids are maintained. Skull and upper cervical spine: Cranial junction within normal limits. Bone marrow signal intensity normal. No scalp soft tissue abnormality. Sinuses/Orbits: Prior bilateral ocular lens replacement. Paranasal sinuses are largely clear. No significant mastoid effusion. Other: None. IMPRESSION: 1. No acute intracranial abnormality. 2. Mild age-related cerebral atrophy with chronic small vessel ischemic disease, with a few scattered small remote bilateral cerebellar infarcts. Electronically Signed   By: Jeannine Boga M.D.   On: 05/24/2022 02:38   CT ANGIO HEAD NECK W WO CM  Result Date: 05/24/2022 CLINICAL DATA:  Initial evaluation for neuro deficit, stroke suspected. EXAM: CT ANGIOGRAPHY HEAD AND NECK TECHNIQUE: Multidetector CT imaging of the head and neck was performed using the standard protocol during bolus administration of intravenous contrast. Multiplanar CT image reconstructions and MIPs were obtained to evaluate the vascular anatomy. Carotid stenosis  measurements (when applicable) are obtained utilizing NASCET criteria, using the distal internal carotid diameter as the denominator. RADIATION DOSE REDUCTION: This exam was performed according to the departmental dose-optimization program which includes automated exposure control, adjustment of the mA and/or kV according to patient size and/or use of iterative reconstruction technique. CONTRAST:  44m OMNIPAQUE IOHEXOL 350 MG/ML SOLN COMPARISON:  None Available. FINDINGS: CT HEAD FINDINGS Brain: Mild age-related cerebral atrophy. No acute intracranial hemorrhage. No acute large vessel territory infarct. No mass lesion or midline shift. No hydrocephalus or extra-axial fluid collection. Vascular: No abnormal hyperdense vessel. Calcified atherosclerosis present at skull base. Skull: Scalp soft tissues and calvarium within normal limits. Sinuses/Orbits: Globes and orbital soft tissues within normal limits. Paranasal sinuses and mastoid air cells are clear. Other: None. Review of the MIP images confirms the above findings CTA NECK FINDINGS Aortic arch: Visualized aortic arch within normal limits for caliber with standard branch pattern. Atheromatous change about the arch itself. No stenosis about the origin the great vessels. Right carotid system: Right common and internal carotid arteries are tortuous but patent without dissection. Mild scattered atheromatous change without hemodynamically significant greater than 50% stenosis. Left carotid system: Left common and internal carotid arteries are tortuous without dissection. Mild scattered atheromatous change without hemodynamically significant greater  than 50% stenosis. Vertebral arteries: Both vertebral arteries arise from subclavian arteries. No significant proximal subclavian artery stenosis. Vertebral arteries patent without significant stenosis or dissection. Skeleton: No discrete or worrisome osseous lesions. Mild-to-moderate multilevel cervical spondylosis, most  pronounced at C5-6 and C6-7. Patient is edentulous. Other neck: No other acute soft tissue abnormality within the neck. 17 mm left thyroid nodule, indeterminate. Upper chest: Scattered ground-Santos density within the visualized lungs, likely mild pulmonary interstitial edema. Review of the MIP images confirms the above findings CTA HEAD FINDINGS Anterior circulation: Atheromatous change within the carotid siphons, right worse than left. Associated up to moderate approximate 50% stenosis at the cavernous right ICA. A1 segments patent bilaterally. Normal anterior communicating artery complex. Both ACAs patent without significant stenosis. Mild atheromatous irregularity about the M1 segments without high-grade stenosis. Focal moderate proximal left M2 stenosis again noted. Distal MCA branches remain perfused and symmetric. Distal small vessel atheromatous irregularity. Posterior circulation: Both V4 segments patent without stenosis. Left vertebral artery slightly dominant. Both PICA patent at their origins. Focal moderate stenosis involving the proximal basilar artery (series 16, image 23). Superior cerebral arteries patent bilaterally. Severe tandem atheromatous stenoses noted at the left P2 segment (series 15, image 21). Atheromatous irregularity about the contralateral right PCA without high-grade stenosis. Venous sinuses: Not well assessed due to arterial timing the contrast bolus. Anatomic variants: None significant.  No aneurysm. Review of the MIP images confirms the above findings IMPRESSION: CT HEAD IMPRESSION: 1. No acute intracranial abnormality. 2. Mild age-related cerebral atrophy with chronic small vessel ischemic disease. CTA HEAD AND NECK IMPRESSION: 1. Negative CTA for large vessel occlusion or other emergent finding. 2. Moderate atheromatous change about the carotid siphons with up to moderate stenosis on the right. 3. 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 4. Additional moderate  stenoses involving a proximal left M2 branch and proximal basilar artery. 5. Scattered ground-Santos density within the visualized lungs, likely mild pulmonary interstitial edema. 6. 17 mm left thyroid nodule, indeterminate. Further evaluation with dedicated thyroid ultrasound recommended. This could be performed on a nonemergent outpatient basis. (Ref: J Am Coll Radiol. 2015 Feb;12(2): 143-50). Electronically Signed   By: Jeannine Boga M.D.   On: 05/24/2022 00:28    Microbiology: Results for orders placed or performed during the hospital encounter of 05/26/22  Surgical pcr screen     Status: Abnormal   Collection Time: 05/28/22  7:16 AM   Specimen: Nasal Mucosa; Nasal Swab  Result Value Ref Range Status   MRSA, PCR NEGATIVE NEGATIVE Final   Staphylococcus aureus POSITIVE (A) NEGATIVE Final    Comment: (NOTE) The Xpert SA Assay (FDA approved for NASAL specimens in patients 84 years of age and older), is one component of a comprehensive surveillance program. It is not intended to diagnose infection nor to guide or monitor treatment. Performed at [facility], [place] [address], [place], [place] [address]     Labs: CBC: Recent Labs  Lab 05/29/22 [phone] 05/30/22 0307 05/30/22 1745 05/31/22 0315 06/01/22 0334 06/02/22 0335  WBC 10.0 10.2  --  8.3 9.4 6.9  HGB 8.9* 7.6* 8.3* 8.6* 8.3* 8.2*  HCT 26.3* 23.9* 25.2* 26.3* 25.7* 24.6*  MCV 90.1 93.4  --  91.3 92.8 90.8  PLT 134* 134*  --  146* 184 [id number]   Basic Metabolic Panel: Recent Labs  Lab 05/26/22 2025 05/26/22 2037 05/26/22 2233 05/28/22 0446 05/29/22 0443 06/02/22 0335  NA 139 140  --  136 136 137  K  4.0 4.1  --  4.3 4.0 4.2  CL 102 105  --  99 104 103  CO2 24  --   --  '25 22 23  '$ GLUCOSE 152* 147*  --  138* 196* 216*  BUN 26* 26*  --  29* 36* 27*  CREATININE 0.78 0.70  --  0.85 0.84 0.77  CALCIUM 9.5  --   --  8.7* 8.1* 8.0*  MG  --   --  1.9 2.0  --   --   PHOS  --   --  3.4  --   --   --    Liver Function  Tests: Recent Labs  Lab 05/26/22 2025 06/02/22 0335  AST 28 39  ALT 30 24  ALKPHOS 79 54  BILITOT 0.5 0.8  PROT 6.7 4.9*  ALBUMIN 3.7 2.0*   CBG: Recent Labs  Lab 06/01/22 0804 06/01/22 1210 06/01/22 1634 06/01/22 2124 06/02/22 0804  GLUCAP 91 113* 115* 133* 114*    Discharge time spent: less than 30 minutes.  Signed: Marylu Lund, MD [facility] 06/02/2022

## 2022-06-02 NOTE — Care Management Important Message (Signed)
Important Message  Patient Details  Name: Rachel Santos MRN: [id number] Date of Birth: Aug 19, 1940   Medicare Important Message Given:  Yes     [name] 06/02/2022, 10:55 AM

## 2022-06-03 ENCOUNTER — Ambulatory Visit (HOSPITAL_COMMUNITY): Payer: Medicare Other

## 2022-06-03 ENCOUNTER — Ambulatory Visit: Payer: Medicare Other

## 2022-06-03 DIAGNOSIS — I480 Paroxysmal atrial fibrillation: Secondary | ICD-10-CM | POA: Diagnosis not present

## 2022-06-03 DIAGNOSIS — D649 Anemia, unspecified: Secondary | ICD-10-CM | POA: Diagnosis not present

## 2022-06-03 DIAGNOSIS — K219 Gastro-esophageal reflux disease without esophagitis: Secondary | ICD-10-CM | POA: Diagnosis not present

## 2022-06-03 DIAGNOSIS — Z4789 Encounter for other orthopedic aftercare: Secondary | ICD-10-CM | POA: Diagnosis not present

## 2022-06-06 DIAGNOSIS — E1165 Type 2 diabetes mellitus with hyperglycemia: Secondary | ICD-10-CM | POA: Diagnosis not present

## 2022-06-06 DIAGNOSIS — S72111D Displaced fracture of greater trochanter of right femur, subsequent encounter for closed fracture with routine healing: Secondary | ICD-10-CM | POA: Diagnosis not present

## 2022-06-06 DIAGNOSIS — R2681 Unsteadiness on feet: Secondary | ICD-10-CM | POA: Diagnosis not present

## 2022-06-06 DIAGNOSIS — D464 Refractory anemia, unspecified: Secondary | ICD-10-CM | POA: Diagnosis not present

## 2022-06-06 DIAGNOSIS — Z9181 History of falling: Secondary | ICD-10-CM | POA: Diagnosis not present

## 2022-06-06 DIAGNOSIS — R2689 Other abnormalities of gait and mobility: Secondary | ICD-10-CM | POA: Diagnosis not present

## 2022-06-06 DIAGNOSIS — M6281 Muscle weakness (generalized): Secondary | ICD-10-CM | POA: Diagnosis not present

## 2022-06-06 DIAGNOSIS — I480 Paroxysmal atrial fibrillation: Secondary | ICD-10-CM | POA: Diagnosis not present

## 2022-06-07 DIAGNOSIS — Z4789 Encounter for other orthopedic aftercare: Secondary | ICD-10-CM | POA: Diagnosis not present

## 2022-06-07 DIAGNOSIS — I480 Paroxysmal atrial fibrillation: Secondary | ICD-10-CM | POA: Diagnosis not present

## 2022-06-07 DIAGNOSIS — D649 Anemia, unspecified: Secondary | ICD-10-CM | POA: Diagnosis not present

## 2022-06-09 DIAGNOSIS — R2681 Unsteadiness on feet: Secondary | ICD-10-CM | POA: Diagnosis not present

## 2022-06-09 DIAGNOSIS — Z9181 History of falling: Secondary | ICD-10-CM | POA: Diagnosis not present

## 2022-06-09 DIAGNOSIS — R2689 Other abnormalities of gait and mobility: Secondary | ICD-10-CM | POA: Diagnosis not present

## 2022-06-09 DIAGNOSIS — M6281 Muscle weakness (generalized): Secondary | ICD-10-CM | POA: Diagnosis not present

## 2022-06-09 DIAGNOSIS — I10 Essential (primary) hypertension: Secondary | ICD-10-CM | POA: Diagnosis not present

## 2022-06-09 DIAGNOSIS — S72111D Displaced fracture of greater trochanter of right femur, subsequent encounter for closed fracture with routine healing: Secondary | ICD-10-CM | POA: Diagnosis not present

## 2022-06-10 DIAGNOSIS — I480 Paroxysmal atrial fibrillation: Secondary | ICD-10-CM | POA: Diagnosis not present

## 2022-06-10 DIAGNOSIS — E118 Type 2 diabetes mellitus with unspecified complications: Secondary | ICD-10-CM | POA: Diagnosis not present

## 2022-06-10 DIAGNOSIS — D649 Anemia, unspecified: Secondary | ICD-10-CM | POA: Diagnosis not present

## 2022-06-13 ENCOUNTER — Ambulatory Visit: Payer: Medicare Other | Admitting: Nurse Practitioner

## 2022-06-13 DIAGNOSIS — D464 Refractory anemia, unspecified: Secondary | ICD-10-CM | POA: Diagnosis not present

## 2022-06-13 DIAGNOSIS — D510 Vitamin B12 deficiency anemia due to intrinsic factor deficiency: Secondary | ICD-10-CM | POA: Diagnosis not present

## 2022-06-13 DIAGNOSIS — D500 Iron deficiency anemia secondary to blood loss (chronic): Secondary | ICD-10-CM | POA: Diagnosis not present

## 2022-06-13 DIAGNOSIS — Z9181 History of falling: Secondary | ICD-10-CM | POA: Diagnosis not present

## 2022-06-13 DIAGNOSIS — R2681 Unsteadiness on feet: Secondary | ICD-10-CM | POA: Diagnosis not present

## 2022-06-13 DIAGNOSIS — M6281 Muscle weakness (generalized): Secondary | ICD-10-CM | POA: Diagnosis not present

## 2022-06-13 DIAGNOSIS — E1165 Type 2 diabetes mellitus with hyperglycemia: Secondary | ICD-10-CM | POA: Diagnosis not present

## 2022-06-13 DIAGNOSIS — S72111D Displaced fracture of greater trochanter of right femur, subsequent encounter for closed fracture with routine healing: Secondary | ICD-10-CM | POA: Diagnosis not present

## 2022-06-13 DIAGNOSIS — I480 Paroxysmal atrial fibrillation: Secondary | ICD-10-CM | POA: Diagnosis not present

## 2022-06-13 DIAGNOSIS — R2689 Other abnormalities of gait and mobility: Secondary | ICD-10-CM | POA: Diagnosis not present

## 2022-06-14 ENCOUNTER — Other Ambulatory Visit: Payer: Self-pay | Admitting: *Deleted

## 2022-06-14 DIAGNOSIS — E08311 Diabetes mellitus due to underlying condition with unspecified diabetic retinopathy with macular edema: Secondary | ICD-10-CM | POA: Diagnosis not present

## 2022-06-14 NOTE — Patient Outreach (Signed)
Mrs. Rachel Santos resides in [facility]. Screening for potential care coordination services as benefit of health plan and Primary Care Provider.   Mrs. Rachel Santos Primary Care Provider's office [facility] has [facility] care management services.   Secure communication sent to [employer] worker to inquire about transition plans.   Will send notification to [facility] care management upon discharge.   Marthenia Rolling, MSN, RN,BSN [facility] Acute Care Coordinator [phone] (Direct dial)

## 2022-06-16 DIAGNOSIS — Z9181 History of falling: Secondary | ICD-10-CM | POA: Diagnosis not present

## 2022-06-16 DIAGNOSIS — R2681 Unsteadiness on feet: Secondary | ICD-10-CM | POA: Diagnosis not present

## 2022-06-16 DIAGNOSIS — R2689 Other abnormalities of gait and mobility: Secondary | ICD-10-CM | POA: Diagnosis not present

## 2022-06-16 DIAGNOSIS — M6281 Muscle weakness (generalized): Secondary | ICD-10-CM | POA: Diagnosis not present

## 2022-06-16 DIAGNOSIS — S72111D Displaced fracture of greater trochanter of right femur, subsequent encounter for closed fracture with routine healing: Secondary | ICD-10-CM | POA: Diagnosis not present

## 2022-06-17 DIAGNOSIS — E08311 Diabetes mellitus due to underlying condition with unspecified diabetic retinopathy with macular edema: Secondary | ICD-10-CM | POA: Diagnosis not present

## 2022-06-20 ENCOUNTER — Other Ambulatory Visit: Payer: Self-pay | Admitting: Internal Medicine

## 2022-06-20 ENCOUNTER — Ambulatory Visit: Payer: Medicare Other | Admitting: Psychiatry

## 2022-06-20 ENCOUNTER — Telehealth: Payer: Self-pay | Admitting: Psychiatry

## 2022-06-20 ENCOUNTER — Encounter: Payer: Self-pay | Admitting: Psychiatry

## 2022-06-20 VITALS — BP 137/73 | HR 91 | Ht 67.0 in | Wt 130.2 lb

## 2022-06-20 DIAGNOSIS — F0390 Unspecified dementia without behavioral disturbance: Secondary | ICD-10-CM | POA: Diagnosis not present

## 2022-06-20 DIAGNOSIS — Z0000 Encounter for general adult medical examination without abnormal findings: Secondary | ICD-10-CM

## 2022-06-20 DIAGNOSIS — M6281 Muscle weakness (generalized): Secondary | ICD-10-CM | POA: Diagnosis not present

## 2022-06-20 DIAGNOSIS — R4701 Aphasia: Secondary | ICD-10-CM

## 2022-06-20 DIAGNOSIS — Z9181 History of falling: Secondary | ICD-10-CM | POA: Diagnosis not present

## 2022-06-20 DIAGNOSIS — R2681 Unsteadiness on feet: Secondary | ICD-10-CM | POA: Diagnosis not present

## 2022-06-20 DIAGNOSIS — S72111D Displaced fracture of greater trochanter of right femur, subsequent encounter for closed fracture with routine healing: Secondary | ICD-10-CM | POA: Diagnosis not present

## 2022-06-20 MED ORDER — DONEPEZIL HCL 5 MG PO TABS: 5.0000 mg | ORAL_TABLET | Freq: Every day | ORAL | 6 refills | Status: DC

## 2022-06-20 NOTE — Patient Instructions (Signed)
Start donepezil for memory. Take this with food to avoid upset stomach  EEG

## 2022-06-20 NOTE — Telephone Encounter (Signed)
Pt stated at check out that she would like to know if it's possible for you to write a referral for a memory care facility. Pt stated a referral is the only way insurance will pay for it

## 2022-06-20 NOTE — Progress Notes (Signed)
CC:  aphasia  Follow-up Visit  Last visit: 04/19/21  Brief HPI: 82 year old female with a history of hearing loss, B12 deficiency, afib on Eliquis, remote CVA, HTN, DM, CKD, arthritis, depression who follows in clinic for memory loss. MRI brain 05/24/22 with mild age-related cerebral atrophy and scattered remote bilateral cerebellar infarcts.  At her last visit MMSE was 25/30. She complained of hearing loss at that time and was recommended to obtain new hearing aids to see if this helped with her memory.   Interval History: She presents today for ED follow up. On 05/23/22 she developed an episode where she developed slurred speech and could not get her words out. This lasted for about half a day, then she had some improvement. Then it returned the next day and she presented to the ED where CTA and MRI were negative for acute process. CTA did show moderate stenosis at left M2 and severe tandem stenosis at left P2, which was also present on her CTA in September 2023. She is currently taking Eliquis, aspirin 81 mg, and lipitor 80 mg daily for stroke prevention. She has previously taking coumadin and Xarelto. Had to stop Xarelto due to GI bleed.  She fell and broke her hip 3 weeks ago. She wasn't using her walker at home and had a mechanical fall. She is working with PT for leg strength and balance.  Memory has been worsening since her last visit. She does not remember the episode of aphasia or falling 3 weeks ago. Her daughter has had to take over managing her medications. She has stopped using her stove due to safety concerns. Daughter lives across the street from her and has been providing her meals. The patient currently lives alone. They have been looking into home care and assisted living facilities but are waiting to hear from insurance.   Physical Exam:   Vital Signs: BP 137/73 (BP Location: Right Arm, Patient Position: Sitting, Cuff Size: Normal)   Pulse 91   Ht 5\' 7"  (1.702 m)   Wt  130 lb 3.2 oz (59.1 kg)   BMI 20.39 kg/m  GENERAL:  well appearing, in no acute distress, alert  SKIN:  Color, texture, turgor normal. No rashes or lesions HEAD:  Normocephalic/atraumatic. RESP: normal respiratory effort MSK:  No gross joint deformities.   NEUROLOGICAL: Mental Status:      06/20/2022    2:39 PM 04/19/2021    9:14 AM  MMSE - Mini Mental State Exam  Orientation to time 0 5  Orientation to Place 5 4  Registration 3 3  Attention/ Calculation 0 2  Recall 1 3  Language- name 2 objects 2 2  Language- repeat 1 1  Language- follow 3 step command 3 3  Language- read & follow direction 1 1  Write a sentence 1 1  Copy design 1 0  Copy design-comments  only drew 1/2 of it  Total score 18 25   Cranial Nerves: PERRL, face symmetric, no dysarthria, hearing grossly intact Motor: 4/5 RLE (limited due to pain from recent hip fracture), otherwise 5/5 throughout. No tremor or bradykinesia Gait: walks with a walker, decreased stride length, limping on right leg (hx hip fracture)  IMPRESSION: 82 year old female with a history of hearing loss, B12 deficiency, afib on Eliquis, remote CVA, HTN, DM, CKD, arthritis, depression who presents for follow up of memory loss and recent episode of aphasia. MRI and CTA head/neck were stable without acute process. 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 However, she is already on anticoagulation, antiplatelet therapy, and max statin dose for stroke prevention. Will order EEG to assess for seizures as a cause for aphasia. MMSE today is 18, significantly worse than her prior score last year (25). Her clinical presentation is consistent with dementia. Will start donepezil for her memory. She is concerned about possible GI upset so will start low dose today and uptitrate as tolerated. Discussed safety concerns with her living alone, and family is currently looking into assisted  living options.  PLAN: -Routine EEG -Start donepezil 5 mg daily, uptitrate as tolerated  Follow-up: 6 months  I spent a total of 47 minutes on the date of the service. Discussed medication side effects, adverse reactions and drug interactions. Written educational materials and patient instructions outlining all of the above were given.  Genia Harold, MD 06/20/22 3:50 PM

## 2022-06-21 ENCOUNTER — Encounter: Payer: Self-pay | Admitting: Psychiatry

## 2022-06-21 ENCOUNTER — Encounter: Payer: Self-pay | Admitting: Cardiovascular Disease

## 2022-06-21 DIAGNOSIS — F0393 Unspecified dementia, unspecified severity, with mood disturbance: Secondary | ICD-10-CM | POA: Diagnosis not present

## 2022-06-21 DIAGNOSIS — Z7901 Long term (current) use of anticoagulants: Secondary | ICD-10-CM | POA: Diagnosis not present

## 2022-06-21 DIAGNOSIS — Z7984 Long term (current) use of oral hypoglycemic drugs: Secondary | ICD-10-CM | POA: Diagnosis not present

## 2022-06-21 DIAGNOSIS — F0394 Unspecified dementia, unspecified severity, with anxiety: Secondary | ICD-10-CM | POA: Diagnosis not present

## 2022-06-21 DIAGNOSIS — Z8673 Personal history of transient ischemic attack (TIA), and cerebral infarction without residual deficits: Secondary | ICD-10-CM | POA: Diagnosis not present

## 2022-06-21 DIAGNOSIS — I480 Paroxysmal atrial fibrillation: Secondary | ICD-10-CM | POA: Diagnosis not present

## 2022-06-21 DIAGNOSIS — S72141A Displaced intertrochanteric fracture of right femur, initial encounter for closed fracture: Secondary | ICD-10-CM | POA: Diagnosis not present

## 2022-06-21 DIAGNOSIS — D631 Anemia in chronic kidney disease: Secondary | ICD-10-CM | POA: Diagnosis not present

## 2022-06-21 DIAGNOSIS — I2510 Atherosclerotic heart disease of native coronary artery without angina pectoris: Secondary | ICD-10-CM | POA: Diagnosis not present

## 2022-06-21 DIAGNOSIS — Z556 Problems related to health literacy: Secondary | ICD-10-CM | POA: Diagnosis not present

## 2022-06-21 DIAGNOSIS — Z794 Long term (current) use of insulin: Secondary | ICD-10-CM | POA: Diagnosis not present

## 2022-06-21 DIAGNOSIS — E1122 Type 2 diabetes mellitus with diabetic chronic kidney disease: Secondary | ICD-10-CM | POA: Diagnosis not present

## 2022-06-21 DIAGNOSIS — I129 Hypertensive chronic kidney disease with stage 1 through stage 4 chronic kidney disease, or unspecified chronic kidney disease: Secondary | ICD-10-CM | POA: Diagnosis not present

## 2022-06-21 DIAGNOSIS — Z952 Presence of prosthetic heart valve: Secondary | ICD-10-CM | POA: Diagnosis not present

## 2022-06-21 DIAGNOSIS — S72141D Displaced intertrochanteric fracture of right femur, subsequent encounter for closed fracture with routine healing: Secondary | ICD-10-CM | POA: Diagnosis not present

## 2022-06-21 DIAGNOSIS — M1611 Unilateral primary osteoarthritis, right hip: Secondary | ICD-10-CM | POA: Diagnosis not present

## 2022-06-21 DIAGNOSIS — N1831 Chronic kidney disease, stage 3a: Secondary | ICD-10-CM | POA: Diagnosis not present

## 2022-06-21 DIAGNOSIS — Z7982 Long term (current) use of aspirin: Secondary | ICD-10-CM | POA: Diagnosis not present

## 2022-06-21 DIAGNOSIS — E785 Hyperlipidemia, unspecified: Secondary | ICD-10-CM | POA: Diagnosis not present

## 2022-06-21 NOTE — Telephone Encounter (Signed)
LVM and sent mychart msg discussing referral

## 2022-06-21 NOTE — Telephone Encounter (Signed)
I can fill out a referral form if they have a specific facility in mind. Usually the facility will have their own form for the doctor to fill out. If they send it over to me I can complete it for them

## 2022-06-21 NOTE — Telephone Encounter (Signed)
Spoke with pt's daughter, [name] and informed her of Dr. Georgina Peer message regarding a memory care referral

## 2022-06-21 NOTE — Telephone Encounter (Signed)
Can you also call daughter on DPR? They were helping with her care.

## 2022-06-23 NOTE — Telephone Encounter (Signed)
I put a diagnosis of dementia in her chart for her. I can fill out a referral to home health care or to a specific memory care facility if they need one, but she will have to ask her PCP to see if they can refer her for skilled nursing.

## 2022-06-24 DIAGNOSIS — E1122 Type 2 diabetes mellitus with diabetic chronic kidney disease: Secondary | ICD-10-CM | POA: Diagnosis not present

## 2022-06-24 DIAGNOSIS — S72141D Displaced intertrochanteric fracture of right femur, subsequent encounter for closed fracture with routine healing: Secondary | ICD-10-CM | POA: Diagnosis not present

## 2022-06-24 DIAGNOSIS — D631 Anemia in chronic kidney disease: Secondary | ICD-10-CM | POA: Diagnosis not present

## 2022-06-24 DIAGNOSIS — F0394 Unspecified dementia, unspecified severity, with anxiety: Secondary | ICD-10-CM | POA: Diagnosis not present

## 2022-06-24 DIAGNOSIS — F0393 Unspecified dementia, unspecified severity, with mood disturbance: Secondary | ICD-10-CM | POA: Diagnosis not present

## 2022-06-24 DIAGNOSIS — I2510 Atherosclerotic heart disease of native coronary artery without angina pectoris: Secondary | ICD-10-CM | POA: Diagnosis not present

## 2022-06-24 DIAGNOSIS — Z7901 Long term (current) use of anticoagulants: Secondary | ICD-10-CM | POA: Diagnosis not present

## 2022-06-24 DIAGNOSIS — E785 Hyperlipidemia, unspecified: Secondary | ICD-10-CM | POA: Diagnosis not present

## 2022-06-24 DIAGNOSIS — Z556 Problems related to health literacy: Secondary | ICD-10-CM | POA: Diagnosis not present

## 2022-06-24 DIAGNOSIS — Z7982 Long term (current) use of aspirin: Secondary | ICD-10-CM | POA: Diagnosis not present

## 2022-06-24 DIAGNOSIS — Z794 Long term (current) use of insulin: Secondary | ICD-10-CM | POA: Diagnosis not present

## 2022-06-24 DIAGNOSIS — Z7984 Long term (current) use of oral hypoglycemic drugs: Secondary | ICD-10-CM | POA: Diagnosis not present

## 2022-06-24 DIAGNOSIS — M1611 Unilateral primary osteoarthritis, right hip: Secondary | ICD-10-CM | POA: Diagnosis not present

## 2022-06-24 DIAGNOSIS — I129 Hypertensive chronic kidney disease with stage 1 through stage 4 chronic kidney disease, or unspecified chronic kidney disease: Secondary | ICD-10-CM | POA: Diagnosis not present

## 2022-06-24 DIAGNOSIS — I480 Paroxysmal atrial fibrillation: Secondary | ICD-10-CM | POA: Diagnosis not present

## 2022-06-24 DIAGNOSIS — Z8673 Personal history of transient ischemic attack (TIA), and cerebral infarction without residual deficits: Secondary | ICD-10-CM | POA: Diagnosis not present

## 2022-06-24 DIAGNOSIS — Z952 Presence of prosthetic heart valve: Secondary | ICD-10-CM | POA: Diagnosis not present

## 2022-06-24 DIAGNOSIS — N1831 Chronic kidney disease, stage 3a: Secondary | ICD-10-CM | POA: Diagnosis not present

## 2022-06-25 DIAGNOSIS — Z7982 Long term (current) use of aspirin: Secondary | ICD-10-CM | POA: Diagnosis not present

## 2022-06-25 DIAGNOSIS — E1122 Type 2 diabetes mellitus with diabetic chronic kidney disease: Secondary | ICD-10-CM | POA: Diagnosis not present

## 2022-06-25 DIAGNOSIS — I480 Paroxysmal atrial fibrillation: Secondary | ICD-10-CM | POA: Diagnosis not present

## 2022-06-25 DIAGNOSIS — S72141D Displaced intertrochanteric fracture of right femur, subsequent encounter for closed fracture with routine healing: Secondary | ICD-10-CM | POA: Diagnosis not present

## 2022-06-25 DIAGNOSIS — E785 Hyperlipidemia, unspecified: Secondary | ICD-10-CM | POA: Diagnosis not present

## 2022-06-25 DIAGNOSIS — F0394 Unspecified dementia, unspecified severity, with anxiety: Secondary | ICD-10-CM | POA: Diagnosis not present

## 2022-06-25 DIAGNOSIS — Z556 Problems related to health literacy: Secondary | ICD-10-CM | POA: Diagnosis not present

## 2022-06-25 DIAGNOSIS — Z794 Long term (current) use of insulin: Secondary | ICD-10-CM | POA: Diagnosis not present

## 2022-06-25 DIAGNOSIS — Z7901 Long term (current) use of anticoagulants: Secondary | ICD-10-CM | POA: Diagnosis not present

## 2022-06-25 DIAGNOSIS — Z7984 Long term (current) use of oral hypoglycemic drugs: Secondary | ICD-10-CM | POA: Diagnosis not present

## 2022-06-25 DIAGNOSIS — M1611 Unilateral primary osteoarthritis, right hip: Secondary | ICD-10-CM | POA: Diagnosis not present

## 2022-06-25 DIAGNOSIS — F0393 Unspecified dementia, unspecified severity, with mood disturbance: Secondary | ICD-10-CM | POA: Diagnosis not present

## 2022-06-25 DIAGNOSIS — D631 Anemia in chronic kidney disease: Secondary | ICD-10-CM | POA: Diagnosis not present

## 2022-06-25 DIAGNOSIS — Z8673 Personal history of transient ischemic attack (TIA), and cerebral infarction without residual deficits: Secondary | ICD-10-CM | POA: Diagnosis not present

## 2022-06-25 DIAGNOSIS — N1831 Chronic kidney disease, stage 3a: Secondary | ICD-10-CM | POA: Diagnosis not present

## 2022-06-25 DIAGNOSIS — I129 Hypertensive chronic kidney disease with stage 1 through stage 4 chronic kidney disease, or unspecified chronic kidney disease: Secondary | ICD-10-CM | POA: Diagnosis not present

## 2022-06-25 DIAGNOSIS — I2510 Atherosclerotic heart disease of native coronary artery without angina pectoris: Secondary | ICD-10-CM | POA: Diagnosis not present

## 2022-06-25 DIAGNOSIS — Z952 Presence of prosthetic heart valve: Secondary | ICD-10-CM | POA: Diagnosis not present

## 2022-06-26 ENCOUNTER — Encounter: Payer: Self-pay | Admitting: Psychiatry

## 2022-06-29 DIAGNOSIS — I480 Paroxysmal atrial fibrillation: Secondary | ICD-10-CM | POA: Diagnosis not present

## 2022-06-29 DIAGNOSIS — Z556 Problems related to health literacy: Secondary | ICD-10-CM | POA: Diagnosis not present

## 2022-06-29 DIAGNOSIS — Z952 Presence of prosthetic heart valve: Secondary | ICD-10-CM | POA: Diagnosis not present

## 2022-06-29 DIAGNOSIS — F0393 Unspecified dementia, unspecified severity, with mood disturbance: Secondary | ICD-10-CM | POA: Diagnosis not present

## 2022-06-29 DIAGNOSIS — Z8673 Personal history of transient ischemic attack (TIA), and cerebral infarction without residual deficits: Secondary | ICD-10-CM | POA: Diagnosis not present

## 2022-06-29 DIAGNOSIS — Z794 Long term (current) use of insulin: Secondary | ICD-10-CM | POA: Diagnosis not present

## 2022-06-29 DIAGNOSIS — Z7982 Long term (current) use of aspirin: Secondary | ICD-10-CM | POA: Diagnosis not present

## 2022-06-29 DIAGNOSIS — Z7984 Long term (current) use of oral hypoglycemic drugs: Secondary | ICD-10-CM | POA: Diagnosis not present

## 2022-06-29 DIAGNOSIS — I129 Hypertensive chronic kidney disease with stage 1 through stage 4 chronic kidney disease, or unspecified chronic kidney disease: Secondary | ICD-10-CM | POA: Diagnosis not present

## 2022-06-29 DIAGNOSIS — N1831 Chronic kidney disease, stage 3a: Secondary | ICD-10-CM | POA: Diagnosis not present

## 2022-06-29 DIAGNOSIS — I2510 Atherosclerotic heart disease of native coronary artery without angina pectoris: Secondary | ICD-10-CM | POA: Diagnosis not present

## 2022-06-29 DIAGNOSIS — E1122 Type 2 diabetes mellitus with diabetic chronic kidney disease: Secondary | ICD-10-CM | POA: Diagnosis not present

## 2022-06-29 DIAGNOSIS — F0394 Unspecified dementia, unspecified severity, with anxiety: Secondary | ICD-10-CM | POA: Diagnosis not present

## 2022-06-29 DIAGNOSIS — E785 Hyperlipidemia, unspecified: Secondary | ICD-10-CM | POA: Diagnosis not present

## 2022-06-29 DIAGNOSIS — S72141D Displaced intertrochanteric fracture of right femur, subsequent encounter for closed fracture with routine healing: Secondary | ICD-10-CM | POA: Diagnosis not present

## 2022-06-29 DIAGNOSIS — M1611 Unilateral primary osteoarthritis, right hip: Secondary | ICD-10-CM | POA: Diagnosis not present

## 2022-06-29 DIAGNOSIS — D631 Anemia in chronic kidney disease: Secondary | ICD-10-CM | POA: Diagnosis not present

## 2022-06-29 DIAGNOSIS — Z7901 Long term (current) use of anticoagulants: Secondary | ICD-10-CM | POA: Diagnosis not present

## 2022-06-30 DIAGNOSIS — S72141D Displaced intertrochanteric fracture of right femur, subsequent encounter for closed fracture with routine healing: Secondary | ICD-10-CM | POA: Diagnosis not present

## 2022-06-30 DIAGNOSIS — M1611 Unilateral primary osteoarthritis, right hip: Secondary | ICD-10-CM | POA: Diagnosis not present

## 2022-06-30 DIAGNOSIS — F0393 Unspecified dementia, unspecified severity, with mood disturbance: Secondary | ICD-10-CM | POA: Diagnosis not present

## 2022-06-30 DIAGNOSIS — I129 Hypertensive chronic kidney disease with stage 1 through stage 4 chronic kidney disease, or unspecified chronic kidney disease: Secondary | ICD-10-CM | POA: Diagnosis not present

## 2022-06-30 DIAGNOSIS — Z952 Presence of prosthetic heart valve: Secondary | ICD-10-CM | POA: Diagnosis not present

## 2022-06-30 DIAGNOSIS — Z556 Problems related to health literacy: Secondary | ICD-10-CM | POA: Diagnosis not present

## 2022-06-30 DIAGNOSIS — N1831 Chronic kidney disease, stage 3a: Secondary | ICD-10-CM | POA: Diagnosis not present

## 2022-06-30 DIAGNOSIS — E785 Hyperlipidemia, unspecified: Secondary | ICD-10-CM | POA: Diagnosis not present

## 2022-06-30 DIAGNOSIS — E1122 Type 2 diabetes mellitus with diabetic chronic kidney disease: Secondary | ICD-10-CM | POA: Diagnosis not present

## 2022-06-30 DIAGNOSIS — Z794 Long term (current) use of insulin: Secondary | ICD-10-CM | POA: Diagnosis not present

## 2022-06-30 DIAGNOSIS — Z7984 Long term (current) use of oral hypoglycemic drugs: Secondary | ICD-10-CM | POA: Diagnosis not present

## 2022-06-30 DIAGNOSIS — I2510 Atherosclerotic heart disease of native coronary artery without angina pectoris: Secondary | ICD-10-CM | POA: Diagnosis not present

## 2022-06-30 DIAGNOSIS — D631 Anemia in chronic kidney disease: Secondary | ICD-10-CM | POA: Diagnosis not present

## 2022-06-30 DIAGNOSIS — I480 Paroxysmal atrial fibrillation: Secondary | ICD-10-CM | POA: Diagnosis not present

## 2022-06-30 DIAGNOSIS — F0394 Unspecified dementia, unspecified severity, with anxiety: Secondary | ICD-10-CM | POA: Diagnosis not present

## 2022-06-30 DIAGNOSIS — Z7982 Long term (current) use of aspirin: Secondary | ICD-10-CM | POA: Diagnosis not present

## 2022-06-30 DIAGNOSIS — Z7901 Long term (current) use of anticoagulants: Secondary | ICD-10-CM | POA: Diagnosis not present

## 2022-06-30 DIAGNOSIS — Z8673 Personal history of transient ischemic attack (TIA), and cerebral infarction without residual deficits: Secondary | ICD-10-CM | POA: Diagnosis not present

## 2022-07-01 ENCOUNTER — Ambulatory Visit: Payer: Medicare Other

## 2022-07-01 DIAGNOSIS — Z794 Long term (current) use of insulin: Secondary | ICD-10-CM | POA: Diagnosis not present

## 2022-07-01 DIAGNOSIS — I129 Hypertensive chronic kidney disease with stage 1 through stage 4 chronic kidney disease, or unspecified chronic kidney disease: Secondary | ICD-10-CM | POA: Diagnosis not present

## 2022-07-01 DIAGNOSIS — F0393 Unspecified dementia, unspecified severity, with mood disturbance: Secondary | ICD-10-CM | POA: Diagnosis not present

## 2022-07-01 DIAGNOSIS — E785 Hyperlipidemia, unspecified: Secondary | ICD-10-CM | POA: Diagnosis not present

## 2022-07-01 DIAGNOSIS — Z952 Presence of prosthetic heart valve: Secondary | ICD-10-CM | POA: Diagnosis not present

## 2022-07-01 DIAGNOSIS — Z556 Problems related to health literacy: Secondary | ICD-10-CM | POA: Diagnosis not present

## 2022-07-01 DIAGNOSIS — Z8673 Personal history of transient ischemic attack (TIA), and cerebral infarction without residual deficits: Secondary | ICD-10-CM | POA: Diagnosis not present

## 2022-07-01 DIAGNOSIS — Z7901 Long term (current) use of anticoagulants: Secondary | ICD-10-CM | POA: Diagnosis not present

## 2022-07-01 DIAGNOSIS — F0394 Unspecified dementia, unspecified severity, with anxiety: Secondary | ICD-10-CM | POA: Diagnosis not present

## 2022-07-01 DIAGNOSIS — N1831 Chronic kidney disease, stage 3a: Secondary | ICD-10-CM | POA: Diagnosis not present

## 2022-07-01 DIAGNOSIS — Z7982 Long term (current) use of aspirin: Secondary | ICD-10-CM | POA: Diagnosis not present

## 2022-07-01 DIAGNOSIS — D631 Anemia in chronic kidney disease: Secondary | ICD-10-CM | POA: Diagnosis not present

## 2022-07-01 DIAGNOSIS — S72141D Displaced intertrochanteric fracture of right femur, subsequent encounter for closed fracture with routine healing: Secondary | ICD-10-CM | POA: Diagnosis not present

## 2022-07-01 DIAGNOSIS — M1611 Unilateral primary osteoarthritis, right hip: Secondary | ICD-10-CM | POA: Diagnosis not present

## 2022-07-01 DIAGNOSIS — Z7984 Long term (current) use of oral hypoglycemic drugs: Secondary | ICD-10-CM | POA: Diagnosis not present

## 2022-07-01 DIAGNOSIS — I480 Paroxysmal atrial fibrillation: Secondary | ICD-10-CM | POA: Diagnosis not present

## 2022-07-01 DIAGNOSIS — I2510 Atherosclerotic heart disease of native coronary artery without angina pectoris: Secondary | ICD-10-CM | POA: Diagnosis not present

## 2022-07-01 DIAGNOSIS — E1122 Type 2 diabetes mellitus with diabetic chronic kidney disease: Secondary | ICD-10-CM | POA: Diagnosis not present

## 2022-07-05 ENCOUNTER — Encounter: Payer: Self-pay | Admitting: *Deleted

## 2022-07-05 ENCOUNTER — Ambulatory Visit (INDEPENDENT_AMBULATORY_CARE_PROVIDER_SITE_OTHER): Payer: Medicare Other | Admitting: Neurology

## 2022-07-05 DIAGNOSIS — R4701 Aphasia: Secondary | ICD-10-CM | POA: Diagnosis not present

## 2022-07-05 NOTE — Procedures (Signed)
    History:  82 year old woman with new aphasia   EEG classification: Awake and drowsy  Description of the recording: The background rhythms of this recording consists of a fairly well modulated medium amplitude alpha rhythm of 8 Hz that is reactive to eye opening and closure. Present in the anterior head region is a 15-20 Hz beta activity. Photic stimulation was performed, did not show any abnormalities. Hyperventilation was not performed. Drowsiness was manifested by background fragmentation. No abnormal epileptiform discharges seen during this recording. There was left frontotemporal focal slowing. There were no electrographic seizure identified.   Abnormality: Left frontotemporal focal slowing   Impression: This is an abnormal EEG recorded while drowsy and awake due to presence of left frontotemporal focal slowing. This is consistent with an area of neuronal dysfunction in the left temporal region.     Windell Norfolk, MD [facility]

## 2022-07-06 ENCOUNTER — Telehealth: Payer: Self-pay

## 2022-07-06 NOTE — Telephone Encounter (Signed)
Called pt's daughter and let her know that her mother's EEG results from Dr. Delena Bali was " EEG shows slowing in her left frontotemporal region. This is a finding that can be seen in people as they get older, especially in people who have a history of strokes. There is no evidence of seizure activity. I will monitor her for now. She should contact the office if she has any other episodes where her speech is affected". Pt's daughter verbalized understanding.

## 2022-07-08 DIAGNOSIS — F0394 Unspecified dementia, unspecified severity, with anxiety: Secondary | ICD-10-CM | POA: Diagnosis not present

## 2022-07-08 DIAGNOSIS — S72141D Displaced intertrochanteric fracture of right femur, subsequent encounter for closed fracture with routine healing: Secondary | ICD-10-CM | POA: Diagnosis not present

## 2022-07-08 DIAGNOSIS — Z794 Long term (current) use of insulin: Secondary | ICD-10-CM | POA: Diagnosis not present

## 2022-07-08 DIAGNOSIS — E1122 Type 2 diabetes mellitus with diabetic chronic kidney disease: Secondary | ICD-10-CM | POA: Diagnosis not present

## 2022-07-08 DIAGNOSIS — M1611 Unilateral primary osteoarthritis, right hip: Secondary | ICD-10-CM | POA: Diagnosis not present

## 2022-07-08 DIAGNOSIS — I480 Paroxysmal atrial fibrillation: Secondary | ICD-10-CM | POA: Diagnosis not present

## 2022-07-08 DIAGNOSIS — Z7982 Long term (current) use of aspirin: Secondary | ICD-10-CM | POA: Diagnosis not present

## 2022-07-08 DIAGNOSIS — F0393 Unspecified dementia, unspecified severity, with mood disturbance: Secondary | ICD-10-CM | POA: Diagnosis not present

## 2022-07-08 DIAGNOSIS — E785 Hyperlipidemia, unspecified: Secondary | ICD-10-CM | POA: Diagnosis not present

## 2022-07-08 DIAGNOSIS — N1831 Chronic kidney disease, stage 3a: Secondary | ICD-10-CM | POA: Diagnosis not present

## 2022-07-08 DIAGNOSIS — Z556 Problems related to health literacy: Secondary | ICD-10-CM | POA: Diagnosis not present

## 2022-07-08 DIAGNOSIS — I2510 Atherosclerotic heart disease of native coronary artery without angina pectoris: Secondary | ICD-10-CM | POA: Diagnosis not present

## 2022-07-08 DIAGNOSIS — I129 Hypertensive chronic kidney disease with stage 1 through stage 4 chronic kidney disease, or unspecified chronic kidney disease: Secondary | ICD-10-CM | POA: Diagnosis not present

## 2022-07-08 DIAGNOSIS — Z7984 Long term (current) use of oral hypoglycemic drugs: Secondary | ICD-10-CM | POA: Diagnosis not present

## 2022-07-08 DIAGNOSIS — Z8673 Personal history of transient ischemic attack (TIA), and cerebral infarction without residual deficits: Secondary | ICD-10-CM | POA: Diagnosis not present

## 2022-07-08 DIAGNOSIS — D631 Anemia in chronic kidney disease: Secondary | ICD-10-CM | POA: Diagnosis not present

## 2022-07-08 DIAGNOSIS — E1165 Type 2 diabetes mellitus with hyperglycemia: Secondary | ICD-10-CM | POA: Diagnosis not present

## 2022-07-08 DIAGNOSIS — Z952 Presence of prosthetic heart valve: Secondary | ICD-10-CM | POA: Diagnosis not present

## 2022-07-08 DIAGNOSIS — Z7901 Long term (current) use of anticoagulants: Secondary | ICD-10-CM | POA: Diagnosis not present

## 2022-07-09 DIAGNOSIS — S72141D Displaced intertrochanteric fracture of right femur, subsequent encounter for closed fracture with routine healing: Secondary | ICD-10-CM | POA: Diagnosis not present

## 2022-07-09 DIAGNOSIS — Z8673 Personal history of transient ischemic attack (TIA), and cerebral infarction without residual deficits: Secondary | ICD-10-CM | POA: Diagnosis not present

## 2022-07-09 DIAGNOSIS — N1831 Chronic kidney disease, stage 3a: Secondary | ICD-10-CM | POA: Diagnosis not present

## 2022-07-09 DIAGNOSIS — D631 Anemia in chronic kidney disease: Secondary | ICD-10-CM | POA: Diagnosis not present

## 2022-07-09 DIAGNOSIS — Z7901 Long term (current) use of anticoagulants: Secondary | ICD-10-CM | POA: Diagnosis not present

## 2022-07-09 DIAGNOSIS — Z794 Long term (current) use of insulin: Secondary | ICD-10-CM | POA: Diagnosis not present

## 2022-07-09 DIAGNOSIS — Z952 Presence of prosthetic heart valve: Secondary | ICD-10-CM | POA: Diagnosis not present

## 2022-07-09 DIAGNOSIS — I129 Hypertensive chronic kidney disease with stage 1 through stage 4 chronic kidney disease, or unspecified chronic kidney disease: Secondary | ICD-10-CM | POA: Diagnosis not present

## 2022-07-09 DIAGNOSIS — F0394 Unspecified dementia, unspecified severity, with anxiety: Secondary | ICD-10-CM | POA: Diagnosis not present

## 2022-07-09 DIAGNOSIS — E785 Hyperlipidemia, unspecified: Secondary | ICD-10-CM | POA: Diagnosis not present

## 2022-07-09 DIAGNOSIS — I2510 Atherosclerotic heart disease of native coronary artery without angina pectoris: Secondary | ICD-10-CM | POA: Diagnosis not present

## 2022-07-09 DIAGNOSIS — E1122 Type 2 diabetes mellitus with diabetic chronic kidney disease: Secondary | ICD-10-CM | POA: Diagnosis not present

## 2022-07-09 DIAGNOSIS — Z556 Problems related to health literacy: Secondary | ICD-10-CM | POA: Diagnosis not present

## 2022-07-09 DIAGNOSIS — Z7984 Long term (current) use of oral hypoglycemic drugs: Secondary | ICD-10-CM | POA: Diagnosis not present

## 2022-07-09 DIAGNOSIS — M1611 Unilateral primary osteoarthritis, right hip: Secondary | ICD-10-CM | POA: Diagnosis not present

## 2022-07-09 DIAGNOSIS — I480 Paroxysmal atrial fibrillation: Secondary | ICD-10-CM | POA: Diagnosis not present

## 2022-07-09 DIAGNOSIS — Z7982 Long term (current) use of aspirin: Secondary | ICD-10-CM | POA: Diagnosis not present

## 2022-07-09 DIAGNOSIS — F0393 Unspecified dementia, unspecified severity, with mood disturbance: Secondary | ICD-10-CM | POA: Diagnosis not present

## 2022-07-13 DIAGNOSIS — Z7901 Long term (current) use of anticoagulants: Secondary | ICD-10-CM | POA: Diagnosis not present

## 2022-07-13 DIAGNOSIS — I2510 Atherosclerotic heart disease of native coronary artery without angina pectoris: Secondary | ICD-10-CM | POA: Diagnosis not present

## 2022-07-13 DIAGNOSIS — M1611 Unilateral primary osteoarthritis, right hip: Secondary | ICD-10-CM | POA: Diagnosis not present

## 2022-07-13 DIAGNOSIS — I480 Paroxysmal atrial fibrillation: Secondary | ICD-10-CM | POA: Diagnosis not present

## 2022-07-13 DIAGNOSIS — F0394 Unspecified dementia, unspecified severity, with anxiety: Secondary | ICD-10-CM | POA: Diagnosis not present

## 2022-07-13 DIAGNOSIS — Z556 Problems related to health literacy: Secondary | ICD-10-CM | POA: Diagnosis not present

## 2022-07-13 DIAGNOSIS — E1122 Type 2 diabetes mellitus with diabetic chronic kidney disease: Secondary | ICD-10-CM | POA: Diagnosis not present

## 2022-07-13 DIAGNOSIS — F0393 Unspecified dementia, unspecified severity, with mood disturbance: Secondary | ICD-10-CM | POA: Diagnosis not present

## 2022-07-13 DIAGNOSIS — N1831 Chronic kidney disease, stage 3a: Secondary | ICD-10-CM | POA: Diagnosis not present

## 2022-07-13 DIAGNOSIS — Z794 Long term (current) use of insulin: Secondary | ICD-10-CM | POA: Diagnosis not present

## 2022-07-13 DIAGNOSIS — S72141D Displaced intertrochanteric fracture of right femur, subsequent encounter for closed fracture with routine healing: Secondary | ICD-10-CM | POA: Diagnosis not present

## 2022-07-13 DIAGNOSIS — E785 Hyperlipidemia, unspecified: Secondary | ICD-10-CM | POA: Diagnosis not present

## 2022-07-13 DIAGNOSIS — Z952 Presence of prosthetic heart valve: Secondary | ICD-10-CM | POA: Diagnosis not present

## 2022-07-13 DIAGNOSIS — I129 Hypertensive chronic kidney disease with stage 1 through stage 4 chronic kidney disease, or unspecified chronic kidney disease: Secondary | ICD-10-CM | POA: Diagnosis not present

## 2022-07-13 DIAGNOSIS — D631 Anemia in chronic kidney disease: Secondary | ICD-10-CM | POA: Diagnosis not present

## 2022-07-13 DIAGNOSIS — Z7984 Long term (current) use of oral hypoglycemic drugs: Secondary | ICD-10-CM | POA: Diagnosis not present

## 2022-07-13 DIAGNOSIS — Z8673 Personal history of transient ischemic attack (TIA), and cerebral infarction without residual deficits: Secondary | ICD-10-CM | POA: Diagnosis not present

## 2022-07-13 DIAGNOSIS — Z7982 Long term (current) use of aspirin: Secondary | ICD-10-CM | POA: Diagnosis not present

## 2022-07-14 DIAGNOSIS — F0394 Unspecified dementia, unspecified severity, with anxiety: Secondary | ICD-10-CM | POA: Diagnosis not present

## 2022-07-14 DIAGNOSIS — N1831 Chronic kidney disease, stage 3a: Secondary | ICD-10-CM | POA: Diagnosis not present

## 2022-07-14 DIAGNOSIS — D631 Anemia in chronic kidney disease: Secondary | ICD-10-CM | POA: Diagnosis not present

## 2022-07-14 DIAGNOSIS — Z8673 Personal history of transient ischemic attack (TIA), and cerebral infarction without residual deficits: Secondary | ICD-10-CM | POA: Diagnosis not present

## 2022-07-14 DIAGNOSIS — Z7901 Long term (current) use of anticoagulants: Secondary | ICD-10-CM | POA: Diagnosis not present

## 2022-07-14 DIAGNOSIS — I129 Hypertensive chronic kidney disease with stage 1 through stage 4 chronic kidney disease, or unspecified chronic kidney disease: Secondary | ICD-10-CM | POA: Diagnosis not present

## 2022-07-14 DIAGNOSIS — Z7982 Long term (current) use of aspirin: Secondary | ICD-10-CM | POA: Diagnosis not present

## 2022-07-14 DIAGNOSIS — Z7984 Long term (current) use of oral hypoglycemic drugs: Secondary | ICD-10-CM | POA: Diagnosis not present

## 2022-07-14 DIAGNOSIS — E785 Hyperlipidemia, unspecified: Secondary | ICD-10-CM | POA: Diagnosis not present

## 2022-07-14 DIAGNOSIS — I2510 Atherosclerotic heart disease of native coronary artery without angina pectoris: Secondary | ICD-10-CM | POA: Diagnosis not present

## 2022-07-14 DIAGNOSIS — F0393 Unspecified dementia, unspecified severity, with mood disturbance: Secondary | ICD-10-CM | POA: Diagnosis not present

## 2022-07-14 DIAGNOSIS — Z794 Long term (current) use of insulin: Secondary | ICD-10-CM | POA: Diagnosis not present

## 2022-07-14 DIAGNOSIS — I480 Paroxysmal atrial fibrillation: Secondary | ICD-10-CM | POA: Diagnosis not present

## 2022-07-14 DIAGNOSIS — Z556 Problems related to health literacy: Secondary | ICD-10-CM | POA: Diagnosis not present

## 2022-07-14 DIAGNOSIS — S72141D Displaced intertrochanteric fracture of right femur, subsequent encounter for closed fracture with routine healing: Secondary | ICD-10-CM | POA: Diagnosis not present

## 2022-07-14 DIAGNOSIS — Z952 Presence of prosthetic heart valve: Secondary | ICD-10-CM | POA: Diagnosis not present

## 2022-07-14 DIAGNOSIS — E1122 Type 2 diabetes mellitus with diabetic chronic kidney disease: Secondary | ICD-10-CM | POA: Diagnosis not present

## 2022-07-14 DIAGNOSIS — M1611 Unilateral primary osteoarthritis, right hip: Secondary | ICD-10-CM | POA: Diagnosis not present

## 2022-07-15 ENCOUNTER — Other Ambulatory Visit: Payer: Self-pay | Admitting: Internal Medicine

## 2022-07-15 DIAGNOSIS — E1122 Type 2 diabetes mellitus with diabetic chronic kidney disease: Secondary | ICD-10-CM | POA: Diagnosis not present

## 2022-07-15 DIAGNOSIS — E1121 Type 2 diabetes mellitus with diabetic nephropathy: Secondary | ICD-10-CM | POA: Diagnosis not present

## 2022-07-15 DIAGNOSIS — D649 Anemia, unspecified: Secondary | ICD-10-CM | POA: Diagnosis not present

## 2022-07-15 DIAGNOSIS — Z8781 Personal history of (healed) traumatic fracture: Secondary | ICD-10-CM | POA: Diagnosis not present

## 2022-07-15 DIAGNOSIS — D6869 Other thrombophilia: Secondary | ICD-10-CM | POA: Diagnosis not present

## 2022-07-15 DIAGNOSIS — N1831 Chronic kidney disease, stage 3a: Secondary | ICD-10-CM | POA: Diagnosis not present

## 2022-07-15 DIAGNOSIS — Z780 Asymptomatic menopausal state: Secondary | ICD-10-CM

## 2022-07-18 DIAGNOSIS — S72141D Displaced intertrochanteric fracture of right femur, subsequent encounter for closed fracture with routine healing: Secondary | ICD-10-CM | POA: Diagnosis not present

## 2022-07-18 DIAGNOSIS — F0394 Unspecified dementia, unspecified severity, with anxiety: Secondary | ICD-10-CM | POA: Diagnosis not present

## 2022-07-18 DIAGNOSIS — I2510 Atherosclerotic heart disease of native coronary artery without angina pectoris: Secondary | ICD-10-CM | POA: Diagnosis not present

## 2022-07-18 DIAGNOSIS — Z7984 Long term (current) use of oral hypoglycemic drugs: Secondary | ICD-10-CM | POA: Diagnosis not present

## 2022-07-18 DIAGNOSIS — M1611 Unilateral primary osteoarthritis, right hip: Secondary | ICD-10-CM | POA: Diagnosis not present

## 2022-07-18 DIAGNOSIS — F0393 Unspecified dementia, unspecified severity, with mood disturbance: Secondary | ICD-10-CM | POA: Diagnosis not present

## 2022-07-18 DIAGNOSIS — N1831 Chronic kidney disease, stage 3a: Secondary | ICD-10-CM | POA: Diagnosis not present

## 2022-07-18 DIAGNOSIS — Z7982 Long term (current) use of aspirin: Secondary | ICD-10-CM | POA: Diagnosis not present

## 2022-07-18 DIAGNOSIS — Z7901 Long term (current) use of anticoagulants: Secondary | ICD-10-CM | POA: Diagnosis not present

## 2022-07-18 DIAGNOSIS — Z8673 Personal history of transient ischemic attack (TIA), and cerebral infarction without residual deficits: Secondary | ICD-10-CM | POA: Diagnosis not present

## 2022-07-18 DIAGNOSIS — Z794 Long term (current) use of insulin: Secondary | ICD-10-CM | POA: Diagnosis not present

## 2022-07-18 DIAGNOSIS — Z952 Presence of prosthetic heart valve: Secondary | ICD-10-CM | POA: Diagnosis not present

## 2022-07-18 DIAGNOSIS — D631 Anemia in chronic kidney disease: Secondary | ICD-10-CM | POA: Diagnosis not present

## 2022-07-18 DIAGNOSIS — Z556 Problems related to health literacy: Secondary | ICD-10-CM | POA: Diagnosis not present

## 2022-07-18 DIAGNOSIS — E1122 Type 2 diabetes mellitus with diabetic chronic kidney disease: Secondary | ICD-10-CM | POA: Diagnosis not present

## 2022-07-18 DIAGNOSIS — E785 Hyperlipidemia, unspecified: Secondary | ICD-10-CM | POA: Diagnosis not present

## 2022-07-18 DIAGNOSIS — I480 Paroxysmal atrial fibrillation: Secondary | ICD-10-CM | POA: Diagnosis not present

## 2022-07-18 DIAGNOSIS — I129 Hypertensive chronic kidney disease with stage 1 through stage 4 chronic kidney disease, or unspecified chronic kidney disease: Secondary | ICD-10-CM | POA: Diagnosis not present

## 2022-07-20 DIAGNOSIS — F0394 Unspecified dementia, unspecified severity, with anxiety: Secondary | ICD-10-CM | POA: Diagnosis not present

## 2022-07-20 DIAGNOSIS — Z794 Long term (current) use of insulin: Secondary | ICD-10-CM | POA: Diagnosis not present

## 2022-07-20 DIAGNOSIS — D631 Anemia in chronic kidney disease: Secondary | ICD-10-CM | POA: Diagnosis not present

## 2022-07-20 DIAGNOSIS — I2510 Atherosclerotic heart disease of native coronary artery without angina pectoris: Secondary | ICD-10-CM | POA: Diagnosis not present

## 2022-07-20 DIAGNOSIS — Z556 Problems related to health literacy: Secondary | ICD-10-CM | POA: Diagnosis not present

## 2022-07-20 DIAGNOSIS — Z8673 Personal history of transient ischemic attack (TIA), and cerebral infarction without residual deficits: Secondary | ICD-10-CM | POA: Diagnosis not present

## 2022-07-20 DIAGNOSIS — Z7982 Long term (current) use of aspirin: Secondary | ICD-10-CM | POA: Diagnosis not present

## 2022-07-20 DIAGNOSIS — F0393 Unspecified dementia, unspecified severity, with mood disturbance: Secondary | ICD-10-CM | POA: Diagnosis not present

## 2022-07-20 DIAGNOSIS — Z7984 Long term (current) use of oral hypoglycemic drugs: Secondary | ICD-10-CM | POA: Diagnosis not present

## 2022-07-20 DIAGNOSIS — S72141D Displaced intertrochanteric fracture of right femur, subsequent encounter for closed fracture with routine healing: Secondary | ICD-10-CM | POA: Diagnosis not present

## 2022-07-20 DIAGNOSIS — N1831 Chronic kidney disease, stage 3a: Secondary | ICD-10-CM | POA: Diagnosis not present

## 2022-07-20 DIAGNOSIS — E785 Hyperlipidemia, unspecified: Secondary | ICD-10-CM | POA: Diagnosis not present

## 2022-07-20 DIAGNOSIS — M1611 Unilateral primary osteoarthritis, right hip: Secondary | ICD-10-CM | POA: Diagnosis not present

## 2022-07-20 DIAGNOSIS — I129 Hypertensive chronic kidney disease with stage 1 through stage 4 chronic kidney disease, or unspecified chronic kidney disease: Secondary | ICD-10-CM | POA: Diagnosis not present

## 2022-07-20 DIAGNOSIS — Z952 Presence of prosthetic heart valve: Secondary | ICD-10-CM | POA: Diagnosis not present

## 2022-07-20 DIAGNOSIS — E1122 Type 2 diabetes mellitus with diabetic chronic kidney disease: Secondary | ICD-10-CM | POA: Diagnosis not present

## 2022-07-20 DIAGNOSIS — I480 Paroxysmal atrial fibrillation: Secondary | ICD-10-CM | POA: Diagnosis not present

## 2022-07-20 DIAGNOSIS — Z7901 Long term (current) use of anticoagulants: Secondary | ICD-10-CM | POA: Diagnosis not present

## 2022-07-22 ENCOUNTER — Ambulatory Visit: Payer: Medicare Other | Admitting: Physician Assistant

## 2022-07-28 DIAGNOSIS — I480 Paroxysmal atrial fibrillation: Secondary | ICD-10-CM | POA: Diagnosis not present

## 2022-07-28 DIAGNOSIS — S72141D Displaced intertrochanteric fracture of right femur, subsequent encounter for closed fracture with routine healing: Secondary | ICD-10-CM | POA: Diagnosis not present

## 2022-07-28 DIAGNOSIS — Z7982 Long term (current) use of aspirin: Secondary | ICD-10-CM | POA: Diagnosis not present

## 2022-07-28 DIAGNOSIS — Z952 Presence of prosthetic heart valve: Secondary | ICD-10-CM | POA: Diagnosis not present

## 2022-07-28 DIAGNOSIS — E785 Hyperlipidemia, unspecified: Secondary | ICD-10-CM | POA: Diagnosis not present

## 2022-07-28 DIAGNOSIS — M1611 Unilateral primary osteoarthritis, right hip: Secondary | ICD-10-CM | POA: Diagnosis not present

## 2022-07-28 DIAGNOSIS — I2510 Atherosclerotic heart disease of native coronary artery without angina pectoris: Secondary | ICD-10-CM | POA: Diagnosis not present

## 2022-07-28 DIAGNOSIS — D631 Anemia in chronic kidney disease: Secondary | ICD-10-CM | POA: Diagnosis not present

## 2022-07-28 DIAGNOSIS — Z8673 Personal history of transient ischemic attack (TIA), and cerebral infarction without residual deficits: Secondary | ICD-10-CM | POA: Diagnosis not present

## 2022-07-28 DIAGNOSIS — Z794 Long term (current) use of insulin: Secondary | ICD-10-CM | POA: Diagnosis not present

## 2022-07-28 DIAGNOSIS — F0394 Unspecified dementia, unspecified severity, with anxiety: Secondary | ICD-10-CM | POA: Diagnosis not present

## 2022-07-28 DIAGNOSIS — I129 Hypertensive chronic kidney disease with stage 1 through stage 4 chronic kidney disease, or unspecified chronic kidney disease: Secondary | ICD-10-CM | POA: Diagnosis not present

## 2022-07-28 DIAGNOSIS — Z7984 Long term (current) use of oral hypoglycemic drugs: Secondary | ICD-10-CM | POA: Diagnosis not present

## 2022-07-28 DIAGNOSIS — N1831 Chronic kidney disease, stage 3a: Secondary | ICD-10-CM | POA: Diagnosis not present

## 2022-07-28 DIAGNOSIS — Z556 Problems related to health literacy: Secondary | ICD-10-CM | POA: Diagnosis not present

## 2022-07-28 DIAGNOSIS — F0393 Unspecified dementia, unspecified severity, with mood disturbance: Secondary | ICD-10-CM | POA: Diagnosis not present

## 2022-07-28 DIAGNOSIS — E1122 Type 2 diabetes mellitus with diabetic chronic kidney disease: Secondary | ICD-10-CM | POA: Diagnosis not present

## 2022-07-28 DIAGNOSIS — Z7901 Long term (current) use of anticoagulants: Secondary | ICD-10-CM | POA: Diagnosis not present

## 2022-08-03 ENCOUNTER — Ambulatory Visit
Admission: RE | Admit: 2022-08-03 | Discharge: 2022-08-03 | Disposition: A | Discharge status: Home / Self Care | Payer: Medicare Other | Source: Ambulatory Visit | Attending: Internal Medicine | Admitting: Internal Medicine

## 2022-08-03 DIAGNOSIS — Z1231 Encounter for screening mammogram for malignant neoplasm of breast: Secondary | ICD-10-CM | POA: Diagnosis not present

## 2022-08-03 DIAGNOSIS — Z0000 Encounter for general adult medical examination without abnormal findings: Secondary | ICD-10-CM

## 2022-08-03 HISTORY — DX: Malignant neoplasm of unspecified site of unspecified female breast: C50.919

## 2022-08-04 DIAGNOSIS — M1611 Unilateral primary osteoarthritis, right hip: Secondary | ICD-10-CM | POA: Diagnosis not present

## 2022-08-04 DIAGNOSIS — F0393 Unspecified dementia, unspecified severity, with mood disturbance: Secondary | ICD-10-CM | POA: Diagnosis not present

## 2022-08-04 DIAGNOSIS — N1831 Chronic kidney disease, stage 3a: Secondary | ICD-10-CM | POA: Diagnosis not present

## 2022-08-04 DIAGNOSIS — E785 Hyperlipidemia, unspecified: Secondary | ICD-10-CM | POA: Diagnosis not present

## 2022-08-04 DIAGNOSIS — Z952 Presence of prosthetic heart valve: Secondary | ICD-10-CM | POA: Diagnosis not present

## 2022-08-04 DIAGNOSIS — Z794 Long term (current) use of insulin: Secondary | ICD-10-CM | POA: Diagnosis not present

## 2022-08-04 DIAGNOSIS — Z8673 Personal history of transient ischemic attack (TIA), and cerebral infarction without residual deficits: Secondary | ICD-10-CM | POA: Diagnosis not present

## 2022-08-04 DIAGNOSIS — D631 Anemia in chronic kidney disease: Secondary | ICD-10-CM | POA: Diagnosis not present

## 2022-08-04 DIAGNOSIS — Z7982 Long term (current) use of aspirin: Secondary | ICD-10-CM | POA: Diagnosis not present

## 2022-08-04 DIAGNOSIS — E1122 Type 2 diabetes mellitus with diabetic chronic kidney disease: Secondary | ICD-10-CM | POA: Diagnosis not present

## 2022-08-04 DIAGNOSIS — Z7984 Long term (current) use of oral hypoglycemic drugs: Secondary | ICD-10-CM | POA: Diagnosis not present

## 2022-08-04 DIAGNOSIS — Z7901 Long term (current) use of anticoagulants: Secondary | ICD-10-CM | POA: Diagnosis not present

## 2022-08-04 DIAGNOSIS — I480 Paroxysmal atrial fibrillation: Secondary | ICD-10-CM | POA: Diagnosis not present

## 2022-08-04 DIAGNOSIS — S72141D Displaced intertrochanteric fracture of right femur, subsequent encounter for closed fracture with routine healing: Secondary | ICD-10-CM | POA: Diagnosis not present

## 2022-08-04 DIAGNOSIS — Z556 Problems related to health literacy: Secondary | ICD-10-CM | POA: Diagnosis not present

## 2022-08-04 DIAGNOSIS — F0394 Unspecified dementia, unspecified severity, with anxiety: Secondary | ICD-10-CM | POA: Diagnosis not present

## 2022-08-04 DIAGNOSIS — I129 Hypertensive chronic kidney disease with stage 1 through stage 4 chronic kidney disease, or unspecified chronic kidney disease: Secondary | ICD-10-CM | POA: Diagnosis not present

## 2022-08-04 DIAGNOSIS — I2510 Atherosclerotic heart disease of native coronary artery without angina pectoris: Secondary | ICD-10-CM | POA: Diagnosis not present

## 2022-08-07 DIAGNOSIS — E1165 Type 2 diabetes mellitus with hyperglycemia: Secondary | ICD-10-CM | POA: Diagnosis not present

## 2022-08-12 DIAGNOSIS — Z8673 Personal history of transient ischemic attack (TIA), and cerebral infarction without residual deficits: Secondary | ICD-10-CM | POA: Diagnosis not present

## 2022-08-12 DIAGNOSIS — I129 Hypertensive chronic kidney disease with stage 1 through stage 4 chronic kidney disease, or unspecified chronic kidney disease: Secondary | ICD-10-CM | POA: Diagnosis not present

## 2022-08-12 DIAGNOSIS — M1611 Unilateral primary osteoarthritis, right hip: Secondary | ICD-10-CM | POA: Diagnosis not present

## 2022-08-12 DIAGNOSIS — Z7901 Long term (current) use of anticoagulants: Secondary | ICD-10-CM | POA: Diagnosis not present

## 2022-08-12 DIAGNOSIS — F0394 Unspecified dementia, unspecified severity, with anxiety: Secondary | ICD-10-CM | POA: Diagnosis not present

## 2022-08-12 DIAGNOSIS — S72141D Displaced intertrochanteric fracture of right femur, subsequent encounter for closed fracture with routine healing: Secondary | ICD-10-CM | POA: Diagnosis not present

## 2022-08-12 DIAGNOSIS — I480 Paroxysmal atrial fibrillation: Secondary | ICD-10-CM | POA: Diagnosis not present

## 2022-08-12 DIAGNOSIS — F0393 Unspecified dementia, unspecified severity, with mood disturbance: Secondary | ICD-10-CM | POA: Diagnosis not present

## 2022-08-12 DIAGNOSIS — Z556 Problems related to health literacy: Secondary | ICD-10-CM | POA: Diagnosis not present

## 2022-08-12 DIAGNOSIS — Z952 Presence of prosthetic heart valve: Secondary | ICD-10-CM | POA: Diagnosis not present

## 2022-08-12 DIAGNOSIS — Z7984 Long term (current) use of oral hypoglycemic drugs: Secondary | ICD-10-CM | POA: Diagnosis not present

## 2022-08-12 DIAGNOSIS — I2510 Atherosclerotic heart disease of native coronary artery without angina pectoris: Secondary | ICD-10-CM | POA: Diagnosis not present

## 2022-08-12 DIAGNOSIS — D631 Anemia in chronic kidney disease: Secondary | ICD-10-CM | POA: Diagnosis not present

## 2022-08-12 DIAGNOSIS — Z7982 Long term (current) use of aspirin: Secondary | ICD-10-CM | POA: Diagnosis not present

## 2022-08-12 DIAGNOSIS — N1831 Chronic kidney disease, stage 3a: Secondary | ICD-10-CM | POA: Diagnosis not present

## 2022-08-12 DIAGNOSIS — E785 Hyperlipidemia, unspecified: Secondary | ICD-10-CM | POA: Diagnosis not present

## 2022-08-12 DIAGNOSIS — E1122 Type 2 diabetes mellitus with diabetic chronic kidney disease: Secondary | ICD-10-CM | POA: Diagnosis not present

## 2022-08-12 DIAGNOSIS — Z794 Long term (current) use of insulin: Secondary | ICD-10-CM | POA: Diagnosis not present

## 2022-08-16 ENCOUNTER — Ambulatory Visit: Payer: Medicare Other | Attending: Nurse Practitioner | Admitting: Physician Assistant

## 2022-08-16 ENCOUNTER — Encounter: Payer: Self-pay | Admitting: Physician Assistant

## 2022-08-16 VITALS — BP 138/62 | HR 67 | Ht 67.0 in | Wt 131.6 lb

## 2022-08-16 DIAGNOSIS — Z952 Presence of prosthetic heart valve: Secondary | ICD-10-CM | POA: Diagnosis not present

## 2022-08-16 DIAGNOSIS — F0393 Unspecified dementia, unspecified severity, with mood disturbance: Secondary | ICD-10-CM | POA: Diagnosis not present

## 2022-08-16 DIAGNOSIS — I350 Nonrheumatic aortic (valve) stenosis: Secondary | ICD-10-CM | POA: Diagnosis not present

## 2022-08-16 DIAGNOSIS — M1611 Unilateral primary osteoarthritis, right hip: Secondary | ICD-10-CM | POA: Diagnosis not present

## 2022-08-16 DIAGNOSIS — I129 Hypertensive chronic kidney disease with stage 1 through stage 4 chronic kidney disease, or unspecified chronic kidney disease: Secondary | ICD-10-CM | POA: Diagnosis not present

## 2022-08-16 DIAGNOSIS — I2510 Atherosclerotic heart disease of native coronary artery without angina pectoris: Secondary | ICD-10-CM

## 2022-08-16 DIAGNOSIS — E785 Hyperlipidemia, unspecified: Secondary | ICD-10-CM | POA: Diagnosis not present

## 2022-08-16 DIAGNOSIS — R002 Palpitations: Secondary | ICD-10-CM | POA: Diagnosis not present

## 2022-08-16 DIAGNOSIS — Z7982 Long term (current) use of aspirin: Secondary | ICD-10-CM | POA: Diagnosis not present

## 2022-08-16 DIAGNOSIS — Z79899 Other long term (current) drug therapy: Secondary | ICD-10-CM | POA: Diagnosis not present

## 2022-08-16 DIAGNOSIS — S72141D Displaced intertrochanteric fracture of right femur, subsequent encounter for closed fracture with routine healing: Secondary | ICD-10-CM | POA: Diagnosis not present

## 2022-08-16 DIAGNOSIS — Z794 Long term (current) use of insulin: Secondary | ICD-10-CM | POA: Diagnosis not present

## 2022-08-16 DIAGNOSIS — I480 Paroxysmal atrial fibrillation: Secondary | ICD-10-CM | POA: Diagnosis not present

## 2022-08-16 DIAGNOSIS — I10 Essential (primary) hypertension: Secondary | ICD-10-CM

## 2022-08-16 DIAGNOSIS — Z8673 Personal history of transient ischemic attack (TIA), and cerebral infarction without residual deficits: Secondary | ICD-10-CM | POA: Diagnosis not present

## 2022-08-16 DIAGNOSIS — Z7901 Long term (current) use of anticoagulants: Secondary | ICD-10-CM | POA: Diagnosis not present

## 2022-08-16 DIAGNOSIS — E1122 Type 2 diabetes mellitus with diabetic chronic kidney disease: Secondary | ICD-10-CM | POA: Diagnosis not present

## 2022-08-16 DIAGNOSIS — Z9889 Other specified postprocedural states: Secondary | ICD-10-CM | POA: Diagnosis not present

## 2022-08-16 DIAGNOSIS — N1831 Chronic kidney disease, stage 3a: Secondary | ICD-10-CM | POA: Diagnosis not present

## 2022-08-16 DIAGNOSIS — Z556 Problems related to health literacy: Secondary | ICD-10-CM | POA: Diagnosis not present

## 2022-08-16 DIAGNOSIS — Z7984 Long term (current) use of oral hypoglycemic drugs: Secondary | ICD-10-CM | POA: Diagnosis not present

## 2022-08-16 DIAGNOSIS — D631 Anemia in chronic kidney disease: Secondary | ICD-10-CM | POA: Diagnosis not present

## 2022-08-16 DIAGNOSIS — F0394 Unspecified dementia, unspecified severity, with anxiety: Secondary | ICD-10-CM | POA: Diagnosis not present

## 2022-08-16 NOTE — Progress Notes (Signed)
Office Visit    Patient Name: Rachel Santos Date of Encounter: 08/16/2022  PCP:  Thana Ates, MD   [facility]  Cardiologist:  Verne Carrow, MD  Advanced Practice Provider:  No care team member to display Electrophysiologist:  None   HPI    Rachel Santos is a 82 y.o. 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  She was seen as a new patient by Dr. Clifton James for evaluation of atrial fibrillation and severe MR on 07/03/2012.  Diagnosed with atrial fibrillation in April 2014 as well as severe MR and TR.  Had TEE guided cardioversion May 2016 with restoration of sinus rhythm.  Her MR was noted to be central and severe.  LV cavity was not dilated.  LV systolic function was normal.  There was moderate to severe TR as well.  Felt to be symptomatic from her mitral valve disease.  Cardiac catheterization 09/05/2012 with mild CAD.  Underwent placement of mitral valve ring and tricuspid valve ring as well as maze procedure by Dr. Priscille Kluver 10/11/2012.  Echo 11/23/2018 with LVEF over 65%.  Moderate LVH.  Stable mitral and tricuspid repair.  Severe aortic stenosis noted.  Cardiac cath September 2020 with mild to moderate nonobstructive CAD.  Underwent successful TAVR with 23 mm Edwards SAPIEN 3 THV via the TF approach on 01/22/2019.  Postoperative echocardiogram showed EF 65 to 70%, normal functioning TAVR with mean gradient of 13 mmHg and mild PVL.  Unfortunately, her TAVR was complicated by extensive right groin hematoma requiring surgical repair and blood transfusions as well as cardioembolic CVA.  MRA showed multifocal acute ischemic scattered within both hemispheres, left greater than right and in the left  cerebellum most consistent with central embolic source.  Was restarted on Xarelto.  She was readmitted to [facility] from 11/9 through 02/07/2019 with groin pain and recurrent aphasia.  Her hemoglobin was noted to be down to 7.4 and she was transfused.  CT of the abdomen pelvis showed a right inguinal hematoma and small right pelvic hematoma.  CT of the head showed no acute abnormality but MRI of the brain showed several small acute cortical-based infarcts in the left frontal, parietal and occipital lobes which may represent an embolic source.  CT angio of the head and neck showed severe narrowing of the left middle cerebral artery and moderate to severe narrowing of the left posterior cerebral artery.  She was evaluated by neuro and underwent full stroke workup.  Her stroke was felt to be most likely related to her intracranial atherosclerotic disease and a baby aspirin was added to her Xarelto.  Cardiac monitor March 2021 with sinus rhythm with PACs.  Since undergone treatment of a left MCA stenosis and has been on Brilinta.  ASA was stopped.  Echo November 2021 with normal LV function and normal functioning AVR with trivial PVL.  She was last seen by Dr. Clifton James February 2023 and at that time she denied chest pain, dyspnea, palpitations, extremity edema, orthopnea, PND, dizziness, near-syncope, or syncope.  Overall doing well from a cardiac standpoint.  Today, she tells me she has frequent falls she has lightheaded dizziness sometimes. She states it happens all day  long and is not associated with a time of day. She has been off  her farxiga and her A1C is almost 10. She has had issues affording it. We have sent a referral to pharmD to review. We reviewed her most recent echo which was done in the hospital. She was found to have moderate to severe AI (hx of TAVR).  Reports no shortness of breath nor dyspnea on exertion. Reports no chest pain, pressure, or tightness. No edema, orthopnea, PND.   Past  Medical History    Past Medical History:  Diagnosis Date   Acute CVA (cerebrovascular accident) (HCC) 01/24/2019   Anxiety    Arthritis    knees   Atrial fibrillation (HCC)    Breast cancer (HCC)    CAD (coronary artery disease)    a. pre TAVR cath 2020 mild nonobstructive CAD in the LAD and CX with moderate nononstructive disease in the mid-distal RCA and PDA.   Cancer (HCC)    left breast   Cataracts, bilateral    CKD (chronic kidney disease), stage III (HCC)    CVA (cerebral vascular accident) (HCC)    Depression    Diabetes mellitus    DVT (deep venous thrombosis) (HCC)    hx of   Dyspnea    with exertion   Full dentures    GERD (gastroesophageal reflux disease)    Groin hematoma    Hearing aid worn    B/L   History of TIA (transient ischemic attack)    mini stroke with vision problems   Hypercholesterolemia    Hypertension    Hypertensive nephropathy    MCI (mild cognitive impairment)    Memory loss    Middle cerebral artery stenosis, left    Mitral regurgitation    Paroxysmal atrial fibrillation (HCC)    Pneumonia    PONV (postoperative nausea and vomiting)    Premature atrial contraction    S/P Maze operation for atrial fibrillation 10/11/2012   Complete bilateral atrial lesion set using bipolar radiofrequency and cryothermy ablation with clipping of LA appendage   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   26mm Sorin Memo 3D ring annuloplasty with 26 mm Edwards mc3 tricuspid ring annuloplasty    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   s/p TAVR with a 23 Edwards Sapien 3 Ultra THV via the TF approach   Thyroid nodule    Tricuspid regurgitation    Wears glasses    Past Surgical History:  Procedure Laterality Date   ABDOMINAL AORTOGRAM W/LOWER EXTREMITY N/A 03/14/2022   Procedure: ABDOMINAL AORTOGRAM W/LOWER EXTREMITY;  Surgeon: Leonie Douglas, MD;  Location: [facility] INVASIVE CV LAB;  Service: Cardiovascular;  Laterality: N/A;   ARTERY REPAIR  Right 01/22/2019   Procedure: Right Common Femoral Artery Repair;  Surgeon: Chuck Hint, MD;  Location: [facility] OR;  Service: Vascular;  Laterality: Right;   CARDIAC CATHETERIZATION  09-05-12   CARDIOVERSION N/A 07/31/2012   Procedure: CARDIOVERSION;  Surgeon: Wendall Stade, MD;  Location: [facility] ENDOSCOPY;  Service: Cardiovascular;  Laterality: N/A;   CATARACT EXTRACTION W/ INTRAOCULAR LENS  IMPLANT, BILATERAL     CESAREAN SECTION     x 2   COLONOSCOPY WITH PROPOFOL N/A 11/28/2014   Procedure: COLONOSCOPY WITH PROPOFOL;  Surgeon: Jeani Hawking, MD;  Location: [facility] ENDOSCOPY;  Service: Endoscopy;  Laterality: N/A;   COLONOSCOPY WITH PROPOFOL N/A 01/22/2021   Procedure: COLONOSCOPY WITH PROPOFOL;  Surgeon: Jeani Hawking, MD;  Location: [facility] ENDOSCOPY;  Service: Endoscopy;  Laterality: N/A;   DILATION AND CURETTAGE OF UTERUS     FEMORAL ARTERY EXPLORATION Right  01/22/2019   Procedure: RIGHT GROIN EXPLORATION with evacuation of hematoma;  Surgeon: Chuck Hint, MD;  Location: [facility] OR;  Service: Vascular;  Laterality: Right;   FLEXIBLE SIGMOIDOSCOPY N/A 12/18/2015   Procedure: FLEXIBLE SIGMOIDOSCOPY;  Surgeon: Jeani Hawking, MD;  Location: [facility] ENDOSCOPY;  Service: Endoscopy;  Laterality: N/A;   INTRAMEDULLARY (IM) NAIL INTERTROCHANTERIC Right 05/28/2022   Procedure: INTRAMEDULLARY (IM) NAIL INTERTROCHANTERIC;  Surgeon: Roby Lofts, MD;  Location: [facility] OR;  Service: Orthopedics;  Laterality: Right;   INTRAOPERATIVE TRANSESOPHAGEAL ECHOCARDIOGRAM N/A 10/11/2012   Procedure: INTRAOPERATIVE TRANSESOPHAGEAL ECHOCARDIOGRAM;  Surgeon: Purcell Nails, MD;  Location: [facility] OR;  Service: Open Heart Surgery;  Laterality: N/A;   IR ANGIO INTRA EXTRACRAN SEL COM CAROTID INNOMINATE BILAT MOD SED  05/21/2019   IR ANGIO VERTEBRAL SEL VERTEBRAL BILAT MOD SED  05/21/2019   MASTECTOMY Left 1982   MAZE N/A 10/11/2012   Procedure: MAZE;  Surgeon: Purcell Nails, MD;  Location: [facility] OR;  Service: Open Heart Surgery;   Laterality: N/A;   MITRAL VALVE REPAIR N/A 10/11/2012   Procedure: MITRAL VALVE REPAIR (MVR);  Surgeon: Purcell Nails, MD;  Location: [facility] OR;  Service: Open Heart Surgery;  Laterality: N/A;   MULTIPLE TOOTH EXTRACTIONS     PERIPHERAL VASCULAR BALLOON ANGIOPLASTY Right 03/14/2022   Procedure: PERIPHERAL VASCULAR BALLOON ANGIOPLASTY;  Surgeon: Leonie Douglas, MD;  Location: [facility];  Service: Cardiovascular;  Laterality: Right;  Peroneal   PERIPHERAL VASCULAR INTERVENTION Right 03/14/2022   Procedure: PERIPHERAL VASCULAR INTERVENTION;  Surgeon: Leonie Douglas, MD;  Location: [facility]AS[facility] LAB;  Service: Cardiovascular;  Laterality: Right;  SFA   RADIOLOGY WITH ANESTHESIA N/A 06/19/2019   Procedure: STENTING;  Surgeon: Julieanne Cotton, MD;  Location: [facility] OR;  Service: Radiology;  Laterality: N/A;   RIGHT HEART CATH AND CORONARY ANGIOGRAPHY N/A 12/24/2018   Procedure: RIGHT HEART CATH AND CORONARY ANGIOGRAPHY;  Surgeon: Kathleene Hazel, MD;  Location: [facility]VASIVE [facility] LAB;  Service: Cardiovascular;  Laterality: N/A;   TEE WITHOUT CARDIOVERSION N/A 07/31/2012   Procedure: TRANSESOPHAGEAL ECHOCARDIOGRAM (TEE);  Surgeon: Wendall Stade, MD;  Location: [facility] ENDOSCOPY;  Service: Cardiovascular;  Laterality: N/A;   TEE WITHOUT CARDIOVERSION N/A 01/22/2019   Procedure: TRANSESOPHAGEAL ECHOCARDIOGRAM (TEE);  Surgeon: Kathleene Hazel, MD;  Location: [facility] INVASIVE CV LAB;  Service: Open Heart Surgery;  Laterality: N/A;   TRANSCATHETER AORTIC VALVE REPLACEMENT, TRANSFEMORAL N/A 01/22/2019   Procedure: TRANSCATHETER AORTIC VALVE REPLACEMENT, TRANSFEMORAL;  Surgeon: Kathleene Hazel, MD;  Location: [facility]VASIVE CV LAB;  Service: Open Heart Surgery;  Laterality: N/A;   TRICUSPID VALVE REPLACEMENT N/A 10/11/2012   Procedure: TRICUSPID VALVE REPAIR;  Surgeon: Purcell Nails, MD;  Location: [facility] OR;  Service: Open Heart Surgery;  Laterality: N/A;   TUBAL LIGATION       Allergies  Allergies  Allergen Reactions   Buspirone Hcl     lethargic/tired   Metformin And Related Diarrhea   Ozempic (0.25 Or 0.5 Mg-Dose) [Semaglutide(0.25 Or 0.5mg -Dos)]     Other reaction(s): nausea   Ultram [Tramadol] Nausea And Vomiting   Nickel Rash    Pt unable to wear jewelry made of nickel.   Pneumococcal Vaccines Swelling and Rash    At injection set     EKGs/Labs/Other Studies Reviewed:   The following studies were reviewed today: Cardiac Studies & Procedures   CARDIAC CATHETERIZATION  CARDIAC CATHETERIZATION 12/24/2018  Narrative  Mid RCA lesion is 50% stenosed.  RPDA lesion is 50% stenosed.  Prox RCA lesion is 30% stenosed.  Mid Cx lesion is 20% stenosed.  Ost LAD to Prox LAD lesion is 40% stenosed.  Mid LAD lesion is 30% stenosed.  1. Mild non-obstructive disease in the LAD and Circumflex 2. Moderate non-obstructive disease in the mid and distal RCA, PDA. 3. Severe aortic stenosis by echo. Unable to cross the aortic valve in the cath lab from the radial approach.  Recommendations: Continue workup for TAVR. Medical management of non-obstructive CAD.  Findings Coronary Findings Diagnostic  Dominance: Right  Left Anterior Descending Ost LAD to Prox LAD lesion is 40% stenosed. Mid LAD lesion is 30% stenosed.  Left Circumflex Vessel is large. Mid Cx lesion is 20% stenosed.  Right Coronary Artery Vessel is large. Prox RCA lesion is 30% stenosed. Mid RCA lesion is 50% stenosed.  Right Posterior Descending Artery RPDA lesion is 50% stenosed.  Intervention  No interventions have been documented.     ECHOCARDIOGRAM  ECHOCARDIOGRAM COMPLETE 05/29/2022  Narrative ECHOCARDIOGRAM REPORT    Patient Name:   DARIEN STANDRIDGE Date of Exam: 05/27/2022 Medical Rec #:  [id number]         Height:       67.0 in Accession #:    [id number]        Weight:       132.9 lb Date of Birth:  07-04-1940        BSA:          1.700 m Patient Age:     81 years          BP:           117/59 mmHg Patient Gender: F                 HR:           73 bpm. Exam Location:  Inpatient  Procedure: 2D Echo, 3D Echo, Cardiac Doppler and Color Doppler  MODIFIED REPORT: This report was modified by Riley Lam MD on 05/29/2022 due to Clarification; Discussed with team. Indications:     Z01.818 Encounter for other preprocedural examination  History:         Patient has prior history of Echocardiogram examinations, most recent 01/30/2020. Stroke, Aortic Valve Disease, Mitral Valve Disease and Tricuspid valve disease.; Risk Factors:Hypertension and Diabetes. Aortic stenosis. Mitral valve repair. Tricuspid valve repair. Aortic valve replacement. Aortic Valve: 23 mm Sapien prosthetic, stented (TAVR) valve is present in the aortic position. Procedure Date: 01/22/2019. Mitral Valve: 26 mm Memo prosthetic annuloplasty ring valve is present in the mitral position. Procedure Date: 10/11/2012.  Sonographer:     Sheralyn Boatman RDCS Referring Phys:  Kenn File DOUTOVA Diagnosing Phys: Riley Lam MD  IMPRESSIONS   1. There appears to be paravaular leak. In the PSAX image series regurgitation looks significant but in the setting of prosthetic mitral valve artifact. The aortic valve has been repaired/replaced. Aortic valve regurgitation may be mild up to moderate but difficult to characterize on this study. There is a 23 mm Sapien prosthetic (TAVR) valve present in the aortic position. Procedure Date: 01/22/2019. Aortic valve mean gradient measures 13.0 mmHg. 2. The mitral valve has been repaired/replaced. Trivial mitral valve regurgitation. Mild mitral stenosis. The mean mitral valve gradient is 4.0 mmHg with average heart rate of 76 bpm. There is a 26 mm Memo prosthetic annuloplasty ring present in the mitral position. Procedure Date: 10/11/2012. 3. The tricuspid valve is status post repair on 05/27/2013 with a 26mm EMC3 annuloplasty ring. 4. Left  ventricular ejection fraction, by estimation, is 65 to 70%. Left ventricular ejection fraction by 3D volume is 70 %. The left ventricle has normal function. The left ventricle has no regional wall motion abnormalities. There is moderate concentric left ventricular hypertrophy. Left ventricular diastolic parameters are indeterminate. 5. Right ventricular systolic function is hyperdynamic. The right ventricular size is normal.  Conclusion(s)/Recommendation(s): Reviewed with team; planned for closer interval follow up.  FINDINGS Left Ventricle: Left ventricular ejection fraction, by estimation, is 65 to 70%. Left ventricular ejection fraction by 3D volume is 70 %. The left ventricle has normal function. The left ventricle has no regional wall motion abnormalities. The left ventricular internal cavity size was small. There is moderate concentric left ventricular hypertrophy. Left ventricular diastolic parameters are indeterminate.  Right Ventricle: The right ventricular size is normal. Right vetricular wall thickness was not well visualized. Right ventricular systolic function is hyperdynamic.  Left Atrium: Left atrial size was normal in size.  Right Atrium: Right atrial size was normal in size.  Pericardium: There is no evidence of pericardial effusion.  Mitral Valve: 2D MVA 2.24 cm2. The mitral valve has been repaired/replaced. Trivial mitral valve regurgitation. There is a 26 mm Memo prosthetic annuloplasty ring present in the mitral position. Procedure Date: 10/11/2012. Mild mitral valve stenosis. MV peak gradient, 9.7 mmHg. The mean mitral valve gradient is 4.0 mmHg with average heart rate of 76 bpm.  Tricuspid Valve: Tricuspid Valve mean gradient 1 mm Hg at a heart rate of 79 bpm. The tricuspid valve is has been repaired/replaced. Tricuspid valve regurgitation is not demonstrated. The tricuspid valve is status post repair with an annuloplasty ring.  Aortic Valve: The aortic valve has been  repaired/replaced. Aortic valve regurgitation is moderate to severe. Aortic regurgitation PHT measures 182 msec. Aortic valve mean gradient measures 13.0 mmHg. Aortic valve peak gradient measures 24.8 mmHg. Aortic valve area, by VTI measures 2.40 cm. There is a 23 mm Sapien prosthetic, stented (TAVR) valve present in the aortic position. Procedure Date: 01/22/2019.  Pulmonic Valve: The pulmonic valve was not well visualized. Pulmonic valve regurgitation is trivial. No evidence of pulmonic stenosis.  Aorta: The aortic root and ascending aorta are structurally normal, with no evidence of dilitation.  IAS/Shunts: No atrial level shunt detected by color flow Doppler.   LEFT VENTRICLE PLAX 2D LVIDd:         3.80 cm         Diastology LVIDs:         1.80 cm         LV e' medial:    4.79 cm/s LV PW:         1.10 cm         LV E/e' medial:  29.2 LV IVS:        1.30 cm         LV e' lateral:   7.40 cm/s LVOT diam:     2.20 cm         LV E/e' lateral: 18.9 LV SV:         101 LV SV Index:   59 LVOT Area:     3.80 cm        3D Volume EF LV 3D EF:    Left ventricul LV Volumes (MOD)                            ar LV vol d, MOD    71.3 ml  ejection A2C:                                        fraction LV vol d, MOD    51.5 ml                    by 3D A4C:                                        volume is LV vol s, MOD    17.8 ml                    70 %. A2C: LV vol s, MOD    12.6 ml A4C:                           3D Volume EF: LV SV MOD A2C:   53.5 ml       3D EF:        70 % LV SV MOD A4C:   51.5 ml       LV EDV:       105 ml LV SV MOD BP:    47.0 ml       LV ESV:       31 ml LV SV:        74 ml  RIGHT VENTRICLE             IVC RV S prime:     14.60 cm/s  IVC diam: 1.60 cm TAPSE (M-mode): 1.1 cm  LEFT ATRIUM             Index        RIGHT ATRIUM           Index LA diam:        3.20 cm 1.88 cm/m   RA Area:     15.10 cm LA Vol (A2C):   75.1 ml 44.18 ml/m  RA Volume:    37.20 ml  21.88 ml/m LA Vol (A4C):   39.8 ml 23.41 ml/m LA Biplane Vol: 54.2 ml 31.88 ml/m AORTIC VALVE AV Area (Vmax):    2.49 cm AV Area (Vmean):   2.42 cm AV Area (VTI):     2.40 cm AV Vmax:           249.00 cm/s AV Vmean:          161.500 cm/s AV VTI:            0.421 m AV Peak Grad:      24.8 mmHg AV Mean Grad:      13.0 mmHg LVOT Vmax:         163.00 cm/s LVOT Vmean:        103.000 cm/s LVOT VTI:          0.266 m LVOT/AV VTI ratio: 0.63 AI PHT:            182 msec  AORTA Ao Root diam: 3.00 cm Ao Asc diam:  3.50 cm  MITRAL VALVE MV Area (PHT): 2.91 cm     SHUNTS MV Area VTI:   2.11 cm     Systemic VTI:  0.27 m MV Peak grad:  9.7 mmHg     Systemic Diam: 2.20 cm MV Mean grad:  4.0 mmHg MV Vmax:       1.56 m/s MV Vmean:      97.5 cm/s MV Decel Time: 261 msec MV E velocity: 140.00 cm/s MV A velocity: 91.60 cm/s MV E/A ratio:  1.53  Riley Lam MD Electronically signed by Riley Lam MD Signature Date/Time: 05/27/2022/1:57:46 PM    Final (Updated)    MONITORS  CARDIAC EVENT MONITOR 07/02/2019  Narrative Sinus rhythm with premature atrial contractions (PACs)   CT SCANS  CT CORONARY MORPH W/CTA COR W/SCORE 01/02/2019  Addendum 01/02/2019 12:22 PM ADDENDUM REPORT: 01/02/2019 12:19  CLINICAL DATA:  Aortic stenosis  EXAM: Cardiac TAVR CT  TECHNIQUE: The patient was scanned on a Siemens Force 192 slice scanner. A 120 kV retrospective scan was triggered in the descending thoracic aorta at 111 HU's. Gantry rotation speed was 270 msecs and collimation was .9 mm. No beta blockade or nitro were given. The 3D data set was reconstructed in 5% intervals of the R-R cycle. Systolic and diastolic phases were analyzed on a dedicated work station using MPR, MIP and VRT modes. The patient received 80 cc of contrast.  FINDINGS: Aortic Valve: Tri leaflet calcified with restricted leaflet motion  Aorta: Bovine arch no aneurysm moderate calcific  atherosclerosis  Sinotubular Junction: 24 mm  Ascending Thoracic Aorta: 32 mm  Aortic Arch: 25 mm  Descending Thoracic Aorta: 23 mm  Sinus of Valsalva Measurements:  Non-coronary: 27 mm  Right - coronary: 26.4 mm  Left - coronary: 28.6 mm  Coronary Artery Height above Annulus:  Left Main: 12.9 mm above annulus  Right Coronary: 12.4 mm above annulus  Virtual Basal Annulus Measurements:  Maximum/Minimum Diameter: 21.4 mm x 26.9 mm  Perimeter: 76.4 mm  Area: 417 mm2  Coronary Arteries: Sufficient height above annulus for deployment  Optimum Fluoroscopic Angle for Delivery: LAO 19 Cranial 2  IMPRESSION: 1. Calcified tri leaflet AV with annular area of 417 mm2 suitable for a 23 mm Sapien 3 valve  2.  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  Coronary arteries sufficient height above annulus for deployment  4. Optimum angiographic angle for deployment LAO 19 Cranial 2 degrees  5. Patient is post MV annuloplasty and TV annuloplasty repair LAA has been clipped closed with no residual communication with LA  Charlton Haws   Electronically Signed By: Charlton Haws M.D. On: 01/02/2019 12:19  Narrative EXAM: OVER-READ INTERPRETATION  CT CHEST  The following report is an over-read performed by radiologist Dr. Trudie Reed of [facility] on 01/02/2019. This over-read does not include interpretation of cardiac or coronary anatomy or pathology. The coronary calcium score/coronary CTA interpretation by the cardiologist is attached.  COMPARISON:  Chest CTA 09/27/2012.  FINDINGS: Extracardiac findings will be described separately under dictation for contemporaneously obtained CTA chest, abdomen and pelvis.  IMPRESSION: Please see separate dictation for contemporaneously obtained CTA chest, abdomen and pelvis dated 01/02/2019 for full description of relevant extracardiac findings.  Electronically Signed: By: Trudie Reed M.D. On:  01/02/2019 11:48           EKG:  EKG is not  ordered today.  Recent Labs: 05/26/2022: TSH 2.375 05/28/2022: Magnesium 2.0 06/02/2022: ALT 24; BUN 27; Creatinine, Ser 0.77; Hemoglobin 8.2; Platelets 186; Potassium 4.2; Sodium 137  Recent Lipid Panel    Component Value Date/Time   CHOL 112 01/23/2019 1530   TRIG 149 01/23/2019 1530   HDL 38 (L) 01/23/2019 1530   CHOLHDL 2.9 01/23/2019 1530   VLDL 30  01/23/2019 1530   LDLCALC 44 01/23/2019 1530    Home Medications   Current Meds  Medication Sig   acetaminophen (TYLENOL) 500 MG tablet Take 1,000 mg by mouth every 6 (six) hours as needed for moderate pain or headache.   apixaban (ELIQUIS) 2.5 MG TABS tablet Take 1 tablet (2.5 mg total) by mouth 2 (two) times daily.   aspirin EC 81 MG tablet Take 81 mg by mouth daily. Swallow whole.   atorvastatin (LIPITOR) 80 MG tablet Take 1 tablet by mouth once daily   Calcium Carb-Cholecalciferol (CALCIUM 600 + D PO) Take 1 tablet by mouth 2 (two) times daily.   docusate sodium (COLACE) 100 MG capsule Take 200 mg by mouth at bedtime.   donepezil (ARICEPT) 5 MG tablet Take 1 tablet (5 mg total) by mouth daily with breakfast.   ferrous sulfate 325 (65 FE) MG EC tablet Take 1 tablet (325 mg total) by mouth 2 (two) times daily.   HYDROcodone-acetaminophen (NORCO/VICODIN) 5-325 MG tablet Take 1 tablet by mouth every 6 (six) hours as needed for severe pain.   insulin detemir (LEVEMIR) 100 UNIT/ML injection Inject 0.15 mLs (15 Units total) into the skin 2 (two) times daily.   lidocaine (LIDODERM) 5 % Place 1 patch onto the skin daily. Remove & Discard patch within 12 hours or as directed by MD (Patient taking differently: Place 1 patch onto the skin daily as needed (For pain).)   Multiple Vitamins-Minerals (MULTIVITAMIN WITH MINERALS) tablet Take 1 tablet by mouth daily.   nitroGLYCERIN (NITROSTAT) 0.4 MG SL tablet Place 1 tablet (0.4 mg total) under the tongue every 5 (five) minutes as needed for chest pain.    omeprazole (PRILOSEC) 40 MG capsule Take 40 mg by mouth daily before breakfast.    ondansetron (ZOFRAN) 4 MG tablet Take 4 mg by mouth every 8 (eight) hours as needed for nausea or vomiting.   polyethylene glycol powder (MIRALAX) 17 GM/SCOOP powder Take 17 g by mouth 2 (two) times daily as needed for moderate constipation or mild constipation.   senna-docusate (SENOKOT-S) 8.6-50 MG tablet Take 1 tablet by mouth 2 (two) times daily.   venlafaxine XR (EFFEXOR-XR) 150 MG 24 hr capsule Take 150 mg by mouth daily with breakfast. Take with 75 mg capsule to equal 225 mg daily   venlafaxine XR (EFFEXOR-XR) 75 MG 24 hr capsule Take 75 mg by mouth daily with breakfast. Take with 150 mg capsule to equal 225 mg daily   vitamin B-12 (CYANOCOBALAMIN) 500 MCG tablet Take 500 mcg by mouth every Monday.     Review of Systems      All other systems reviewed and are otherwise negative except as noted above.  Physical Exam    VS:  BP 138/62   Pulse 67   Ht 5\' 7"  (1.702 m)   Wt 131 lb 9.6 oz (59.7 kg)   SpO2 93%   BMI 20.61 kg/m  , BMI Body mass index is 20.61 kg/m.  Wt Readings from Last 3 Encounters:  08/16/22 131 lb 9.6 oz (59.7 kg)  06/20/22 130 lb 3.2 oz (59.1 kg)  05/27/22 132 lb 15 oz (60.3 kg)     GEN: Well nourished, well developed, in no acute distress. HEENT: normal. Neck: Supple, no JVD, carotid bruits, or masses. Cardiac: RRR, no murmurs, rubs, or gallops. No clubbing, cyanosis, edema.  Radials/PT 2+ and equal bilaterally.  Respiratory:  Respirations regular and unlabored, clear to auscultation bilaterally. GI: Soft, nontender, nondistended. MS: No deformity or  atrophy. Skin: Warm and dry, no rash. Neuro:  Strength and sensation are intact. Psych: Normal affect.  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 Clifton James -right now symptomatic with lightheadedness and dizziness, hx of falls -continue current  medications for now: Eliquis 2.5 mg twice a day, aspirin 81 g, Lipitor 80 mg daily, nitro as needed  Mild regurgitation/tricuspid regurgitation -reviewed recent echo with the patient today -continue current medications  CAD without angina -no chest pains or SOB -no need for PRN nitro -discussed diet and adherence to medications  Hypertension -BP is good today -continue current medications  Atrial fibrillation, paroxysmal -NSR today -doing well from a rhythm standpoint  Palpitation -doing a lot better with her current medication regimen       Disposition: Follow up 3-4 months with Verne Carrow, MD or APP.  Signed, Sharlene Dory, PA-C 08/16/2022, 5:04 PM [facility]

## 2022-08-16 NOTE — Patient Instructions (Addendum)
Medication Instructions:  Your physician recommends that you continue on your current medications as directed. Please refer to the Current Medication list given to you today.  *If you need a refill on your cardiac medications before your next appointment, please call your pharmacy*  Lab Work: None ordered If you have labs (blood work) drawn today and your tests are completely normal, you will receive your results only by: MyChart Message (if you have MyChart) OR A paper copy in the mail If you have any lab test that is abnormal or we need to change your treatment, we will call you to review the results.  Follow-Up: At [facility], you and your health needs are our priority.  As part of our continuing mission to provide you with exceptional heart care, we have created designated Provider Care Teams.  These Care Teams include your primary Cardiologist (physician) and Advanced Practice Providers (APPs -  Physician Assistants and Nurse Practitioners) who all work together to provide you with the care you need, when you need it.  Your next appointment:   3-4 month(s) or first available  Provider:   Verne Carrow, MD    Other Instructions You have been referred to see the pharmacist here in our office in 1 month.   Diabetes Mellitus and Nutrition, Adult When you have diabetes, or diabetes mellitus, it is very important to have healthy eating habits because your blood sugar (glucose) levels are greatly affected by what you eat and drink. Eating healthy foods in the right amounts, at about the same times every day, can help you: Manage your blood glucose. Lower your risk of heart disease. Improve your blood pressure. Reach or maintain a healthy weight. What can affect my meal plan? Every person with diabetes is different, and each person has different needs for a meal plan. Your health care provider may recommend that you work with a dietitian to make a meal plan that is best  for you. Your meal plan may vary depending on factors such as: The calories you need. The medicines you take. Your weight. Your blood glucose, blood pressure, and cholesterol levels. Your activity level. Other health conditions you have, such as heart or kidney disease. How do carbohydrates affect me? Carbohydrates, also called carbs, affect your blood glucose level more than any other type of food. Eating carbs raises the amount of glucose in your blood. It is important to know how many carbs you can safely have in each meal. This is different for every person. Your dietitian can help you calculate how many carbs you should have at each meal and for each snack. How does alcohol affect me? Alcohol can cause a decrease in blood glucose (hypoglycemia), especially if you use insulin or take certain diabetes medicines by mouth. Hypoglycemia can be a life-threatening condition. Symptoms of hypoglycemia, such as sleepiness, dizziness, and confusion, are similar to symptoms of having too much alcohol. Do not drink alcohol if: Your health care provider tells you not to drink. You are pregnant, may be pregnant, or are planning to become pregnant. If you drink alcohol: Limit how much you have to: 0-1 drink a day for women. 0-2 drinks a day for men. Know how much alcohol is in your drink. In the [place] one drink equals one 12 oz bottle of beer (355 mL), one 5 oz glass of wine (148 mL), or one 1 oz glass of hard liquor (44 mL). Keep yourself hydrated with water, diet soda, or unsweetened iced tea. Keep  in mind that regular soda, juice, and other mixers may contain a lot of sugar and must be counted as carbs. What are tips for following this plan?  Reading food labels Start by checking the serving size on the Nutrition Facts label of packaged foods and drinks. The number of calories and the amount of carbs, fats, and other nutrients listed on the label are based on one serving of the item. Many items  contain more than one serving per package. Check the total grams (g) of carbs in one serving. Check the number of grams of saturated fats and trans fats in one serving. Choose foods that have a low amount or none of these fats. Check the number of milligrams (mg) of salt (sodium) in one serving. Most people should limit total sodium intake to less than 2,300 mg per day. Always check the nutrition information of foods labeled as "low-fat" or "nonfat." These foods may be higher in added sugar or refined carbs and should be avoided. Talk to your dietitian to identify your daily goals for nutrients listed on the label. Shopping Avoid buying canned, pre-made, or processed foods. These foods tend to be high in fat, sodium, and added sugar. Shop around the outside edge of the grocery store. This is where you will most often find fresh fruits and vegetables, bulk grains, fresh meats, and fresh dairy products. Cooking Use low-heat cooking methods, such as baking, instead of high-heat cooking methods, such as deep frying. Cook using healthy oils, such as olive, canola, or sunflower oil. Avoid cooking with butter, cream, or high-fat meats. Meal planning Eat meals and snacks regularly, preferably at the same times every day. Avoid going long periods of time without eating. Eat foods that are high in fiber, such as fresh fruits, vegetables, beans, and whole grains. Eat 4-6 oz (112-168 g) of lean protein each day, such as lean meat, chicken, fish, eggs, or tofu. One ounce (oz) (28 g) of lean protein is equal to: 1 oz (28 g) of meat, chicken, or fish. 1 egg.  cup (62 g) of tofu. Eat some foods each day that contain healthy fats, such as avocado, nuts, seeds, and fish. What foods should I eat? Fruits Berries. Apples. Oranges. Peaches. Apricots. Plums. Grapes. Mangoes. Papayas. Pomegranates. Kiwi. Cherries. Vegetables Leafy greens, including lettuce, spinach, kale, chard, collard greens, mustard greens, and  cabbage. Beets. Cauliflower. Broccoli. Carrots. Green beans. Tomatoes. Peppers. Onions. Cucumbers. Brussels sprouts. Grains Whole grains, such as whole-wheat or whole-grain bread, crackers, tortillas, cereal, and pasta. Unsweetened oatmeal. Quinoa. Brown or wild rice. Meats and other proteins Seafood. Poultry without skin. Lean cuts of poultry and beef. Tofu. Nuts. Seeds. Dairy Low-fat or fat-free dairy products such as milk, yogurt, and cheese. The items listed above may not be a complete list of foods and beverages you can eat and drink. Contact a dietitian for more information. What foods should I avoid? Fruits Fruits canned with syrup. Vegetables Canned vegetables. Frozen vegetables with butter or cream sauce. Grains Refined white flour and flour products such as bread, pasta, snack foods, and cereals. Avoid all processed foods. Meats and other proteins Fatty cuts of meat. Poultry with skin. Breaded or fried meats. Processed meat. Avoid saturated fats. Dairy Full-fat yogurt, cheese, or milk. Beverages Sweetened drinks, such as soda or iced tea. The items listed above may not be a complete list of foods and beverages you should avoid. Contact a dietitian for more information. Questions to ask a health care provider Do I need  to meet with a certified diabetes care and education specialist? Do I need to meet with a dietitian? What number can I call if I have questions? When are the best times to check my blood glucose? Where to find more information: American Diabetes Association: diabetes.org Academy of Nutrition and Dietetics: eatright[employer] of Diabetes and Digestive and Kidney Diseases: StageSync.si Association of Diabetes Care & Education Specialists: diabeteseducator.org Summary It is important to have healthy eating habits because your blood sugar (glucose) levels are greatly affected by what you eat and drink. It is important to use alcohol carefully. A  healthy meal plan will help you manage your blood glucose and lower your risk of heart disease. Your health care provider may recommend that you work with a dietitian to make a meal plan that is best for you. This information is not intended to replace advice given to you by your health care provider. Make sure you discuss any questions you have with your health care provider. Document Revised: 10/16/2019 Document Reviewed: 10/16/2019 Elsevier Patient Education  2023 Elsevier Inc.   Low-Sodium Eating Plan Sodium, which is an element that makes up salt, helps you maintain a healthy balance of fluids in your body. Too much sodium can increase your blood pressure and cause fluid and waste to be held in your body. Your health care provider or dietitian may recommend following this plan if you have high blood pressure (hypertension), kidney disease, liver disease, or heart failure. Eating less sodium can help lower your blood pressure, reduce swelling, and protect your heart, liver, and kidneys. What are tips for following this plan? Reading food labels The Nutrition Facts label lists the amount of sodium in one serving of the food. If you eat more than one serving, you must multiply the listed amount of sodium by the number of servings. Choose foods with less than 140 mg of sodium per serving. Avoid foods with 300 mg of sodium or more per serving. Shopping  Look for lower-sodium products, often labeled as "low-sodium" or "no salt added." Always check the sodium content, even if foods are labeled as "unsalted" or "no salt added." Buy fresh foods. Avoid canned foods and pre-made or frozen meals. Avoid canned, cured, or processed meats. Buy breads that have less than 80 mg of sodium per slice. Cooking  Eat more home-cooked food and less restaurant, buffet, and fast food. Avoid adding salt when cooking. Use salt-free seasonings or herbs instead of table salt or sea salt. Check with your health  care provider or pharmacist before using salt substitutes. Cook with plant-based oils, such as canola, sunflower, or olive oil. Meal planning When eating at a restaurant, ask that your food be prepared with less salt or no salt, if possible. Avoid dishes labeled as brined, pickled, cured, smoked, or made with soy sauce, miso, or teriyaki sauce. Avoid foods that contain MSG (monosodium glutamate). MSG is sometimes added to [place] food, bouillon, and some canned foods. Make meals that can be grilled, baked, poached, roasted, or steamed. These are generally made with less sodium. General information Most people on this plan should limit their sodium intake to 1,500-2,000 mg (milligrams) of sodium each day. What foods should I eat? Fruits Fresh, frozen, or canned fruit. Fruit juice. Vegetables Fresh or frozen vegetables. "No salt added" canned vegetables. "No salt added" tomato sauce and paste. Low-sodium or reduced-sodium tomato and vegetable juice. Grains Low-sodium cereals, including oats, puffed wheat and rice, and shredded wheat. Low-sodium crackers. Unsalted rice. Unsalted  pasta. Low-sodium bread. Whole-grain breads and whole-grain pasta. Meats and other proteins Fresh or frozen (no salt added) meat, poultry, seafood, and fish. Low-sodium canned tuna and salmon. Unsalted nuts. Dried peas, beans, and lentils without added salt. Unsalted canned beans. Eggs. Unsalted nut butters. Dairy Milk. Soy milk. Cheese that is naturally low in sodium, such as ricotta cheese, fresh mozzarella, or Swiss cheese. Low-sodium or reduced-sodium cheese. Cream cheese. Yogurt. Seasonings and condiments Fresh and dried herbs and spices. Salt-free seasonings. Low-sodium mustard and ketchup. Sodium-free salad dressing. Sodium-free light mayonnaise. Fresh or refrigerated horseradish. Lemon juice. Vinegar. Other foods Homemade, reduced-sodium, or low-sodium soups. Unsalted popcorn and pretzels. Low-salt or salt-free  chips. The items listed above may not be a complete list of foods and beverages you can eat. Contact a dietitian for more information. What foods should I avoid? Vegetables Sauerkraut, pickled vegetables, and relishes. Olives. [place] fries. Onion rings. Regular canned vegetables (not low-sodium or reduced-sodium). Regular canned tomato sauce and paste (not low-sodium or reduced-sodium). Regular tomato and vegetable juice (not low-sodium or reduced-sodium). Frozen vegetables in sauces. Grains Instant hot cereals. Bread stuffing, pancake, and biscuit mixes. Croutons. Seasoned rice or pasta mixes. Noodle soup cups. Boxed or frozen macaroni and cheese. Regular salted crackers. Self-rising flour. Meats and other proteins Meat or fish that is salted, canned, smoked, spiced, or pickled. Precooked or cured meat, such as sausages or meat loaves. Tomasa Blase. Ham. Pepperoni. Hot dogs. Corned beef. Chipped beef. Salt pork. Jerky. Pickled herring. Anchovies and sardines. Regular canned tuna. Salted nuts. Dairy Processed cheese and cheese spreads. Hard cheeses. Cheese curds. Blue cheese. Feta cheese. String cheese. Regular cottage cheese. Buttermilk. Canned milk. Fats and oils Salted butter. Regular margarine. Ghee. Bacon fat. Seasonings and condiments Onion salt, garlic salt, seasoned salt, table salt, and sea salt. Canned and packaged gravies. Worcestershire sauce. Tartar sauce. Barbecue sauce. Teriyaki sauce. Soy sauce, including reduced-sodium. Steak sauce. Fish sauce. Oyster sauce. Cocktail sauce. Horseradish that you find on the shelf. Regular ketchup and mustard. Meat flavorings and tenderizers. Bouillon cubes. Hot sauce. Pre-made or packaged marinades. Pre-made or packaged taco seasonings. Relishes. Regular salad dressings. Salsa. Other foods Salted popcorn and pretzels. Corn chips and puffs. Potato and tortilla chips. Canned or dried soups. Pizza. Frozen entrees and pot pies. The items listed above may not be  a complete list of foods and beverages you should avoid. Contact a dietitian for more information. Summary Eating less sodium can help lower your blood pressure, reduce swelling, and protect your heart, liver, and kidneys. Most people on this plan should limit their sodium intake to 1,500-2,000 mg (milligrams) of sodium each day. Canned, boxed, and frozen foods are high in sodium. Restaurant foods, fast foods, and pizza are also very high in sodium. You also get sodium by adding salt to food. Try to cook at home, eat more fresh fruits and vegetables, and eat less fast food and canned, processed, or prepared foods. This information is not intended to replace advice given to you by your health care provider. Make sure you discuss any questions you have with your health care provider. Document Revised: 02/18/2019 Document Reviewed: 02/13/2019 Elsevier Patient Education  2023 [employer].

## 2022-08-18 DIAGNOSIS — N1831 Chronic kidney disease, stage 3a: Secondary | ICD-10-CM | POA: Diagnosis not present

## 2022-08-18 DIAGNOSIS — I480 Paroxysmal atrial fibrillation: Secondary | ICD-10-CM | POA: Diagnosis not present

## 2022-08-18 DIAGNOSIS — G8929 Other chronic pain: Secondary | ICD-10-CM | POA: Diagnosis not present

## 2022-08-18 DIAGNOSIS — K219 Gastro-esophageal reflux disease without esophagitis: Secondary | ICD-10-CM | POA: Diagnosis not present

## 2022-08-18 DIAGNOSIS — E7800 Pure hypercholesterolemia, unspecified: Secondary | ICD-10-CM | POA: Diagnosis not present

## 2022-08-18 DIAGNOSIS — E1121 Type 2 diabetes mellitus with diabetic nephropathy: Secondary | ICD-10-CM | POA: Diagnosis not present

## 2022-09-06 ENCOUNTER — Other Ambulatory Visit: Payer: Self-pay | Admitting: Cardiovascular Disease

## 2022-09-06 DIAGNOSIS — E1165 Type 2 diabetes mellitus with hyperglycemia: Secondary | ICD-10-CM | POA: Diagnosis not present

## 2022-09-23 DIAGNOSIS — N1831 Chronic kidney disease, stage 3a: Secondary | ICD-10-CM | POA: Diagnosis not present

## 2022-09-23 DIAGNOSIS — E1121 Type 2 diabetes mellitus with diabetic nephropathy: Secondary | ICD-10-CM | POA: Diagnosis not present

## 2022-09-23 DIAGNOSIS — I480 Paroxysmal atrial fibrillation: Secondary | ICD-10-CM | POA: Diagnosis not present

## 2022-09-23 DIAGNOSIS — G8929 Other chronic pain: Secondary | ICD-10-CM | POA: Diagnosis not present

## 2022-09-23 DIAGNOSIS — E7800 Pure hypercholesterolemia, unspecified: Secondary | ICD-10-CM | POA: Diagnosis not present

## 2022-09-23 DIAGNOSIS — K219 Gastro-esophageal reflux disease without esophagitis: Secondary | ICD-10-CM | POA: Diagnosis not present

## 2022-09-26 DIAGNOSIS — Z0000 Encounter for general adult medical examination without abnormal findings: Secondary | ICD-10-CM | POA: Diagnosis not present

## 2022-09-26 DIAGNOSIS — E1165 Type 2 diabetes mellitus with hyperglycemia: Secondary | ICD-10-CM | POA: Diagnosis not present

## 2022-09-26 DIAGNOSIS — E1122 Type 2 diabetes mellitus with diabetic chronic kidney disease: Secondary | ICD-10-CM | POA: Diagnosis not present

## 2022-09-26 DIAGNOSIS — D649 Anemia, unspecified: Secondary | ICD-10-CM | POA: Diagnosis not present

## 2022-09-26 DIAGNOSIS — D6869 Other thrombophilia: Secondary | ICD-10-CM | POA: Diagnosis not present

## 2022-09-26 DIAGNOSIS — Z794 Long term (current) use of insulin: Secondary | ICD-10-CM | POA: Diagnosis not present

## 2022-09-26 DIAGNOSIS — I129 Hypertensive chronic kidney disease with stage 1 through stage 4 chronic kidney disease, or unspecified chronic kidney disease: Secondary | ICD-10-CM | POA: Diagnosis not present

## 2022-09-26 DIAGNOSIS — E7800 Pure hypercholesterolemia, unspecified: Secondary | ICD-10-CM | POA: Diagnosis not present

## 2022-09-26 DIAGNOSIS — I480 Paroxysmal atrial fibrillation: Secondary | ICD-10-CM | POA: Diagnosis not present

## 2022-09-26 DIAGNOSIS — N1831 Chronic kidney disease, stage 3a: Secondary | ICD-10-CM | POA: Diagnosis not present

## 2022-10-06 DIAGNOSIS — E1165 Type 2 diabetes mellitus with hyperglycemia: Secondary | ICD-10-CM | POA: Diagnosis not present

## 2022-10-06 DIAGNOSIS — Z794 Long term (current) use of insulin: Secondary | ICD-10-CM | POA: Diagnosis not present

## 2022-11-05 DIAGNOSIS — Z794 Long term (current) use of insulin: Secondary | ICD-10-CM | POA: Diagnosis not present

## 2022-11-05 DIAGNOSIS — E1165 Type 2 diabetes mellitus with hyperglycemia: Secondary | ICD-10-CM | POA: Diagnosis not present

## 2022-11-24 ENCOUNTER — Ambulatory Visit: Payer: Medicare Other | Attending: Cardiovascular Disease | Admitting: Cardiovascular Disease

## 2022-11-24 ENCOUNTER — Encounter: Payer: Self-pay | Admitting: Cardiovascular Disease

## 2022-11-24 VITALS — BP 128/60 | HR 67 | Ht 67.0 in | Wt 129.6 lb

## 2022-11-24 DIAGNOSIS — I2510 Atherosclerotic heart disease of native coronary artery without angina pectoris: Secondary | ICD-10-CM | POA: Diagnosis not present

## 2022-11-24 DIAGNOSIS — I480 Paroxysmal atrial fibrillation: Secondary | ICD-10-CM | POA: Diagnosis not present

## 2022-11-24 DIAGNOSIS — Z952 Presence of prosthetic heart valve: Secondary | ICD-10-CM

## 2022-11-24 DIAGNOSIS — I10 Essential (primary) hypertension: Secondary | ICD-10-CM

## 2022-11-24 NOTE — Patient Instructions (Signed)
Medication Instructions:  Your physician recommends that you continue on your current medications as directed. Please refer to the Current Medication list given to you today.  *If you need a refill on your cardiac medications before your next appointment, please call your pharmacy*   Follow-Up: At [facility], you and your health needs are our priority.  As part of our continuing mission to provide you with exceptional heart care, we have created designated Provider Care Teams.  These Care Teams include your primary Cardiologist (physician) and Advanced Practice Providers (APPs -  Physician Assistants and Nurse Practitioners) who all work together to provide you with the care you need, when you need it.  Your next appointment:   1 year(s)  Provider:   Christopher McAlhany, MD     

## 2022-11-24 NOTE — Progress Notes (Signed)
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 I saw her as a new patient for evaluation of atrial fibrillation and severe MR in 2014. She was diagnosed with atrial fibrillation in April of 2014 as well as severe MR and TR. She had a TEE guided cardioversion in May 2016 with restoration of sinus rhythm. Her MR was noted to be central and severe. The LV cavity was not dilated. LV systolic function was normal. There was moderate to severe TR as well. She was felt to be symptomatic from her mitral valve disease. Cardiac cath on 09/05/12 with mild CAD. She underwent placement of mitral valve ring and tricuspid valve ring as well as MAZE procedure per Dr. Cornelius Moras on 10/11/12.  Echo 11/23/18 with LVEF over 65%. Moderate LVH. Stable mitral and tricuspid repair.Severe aortic stenosis noted. Cardiac cath September 2020 with mild to moderate non-obstructive CAD. She underwent successful TAVR with a 23 mm Edwards Sapien 3 THV via the TF approach on 01/22/19. Post operative echo showed EF 65-70%, normally functioning TAVR with mean gradient of 13 mm Hg and mild PVL. Unfortunately, her TAVR was complicated by an extensive right groin hematoma requiring surgical repair and blood transfusions as well as cardioembolic CVA. MRA showed multifocal acute ischemia scattered within both hemispheres, left-greater-than-right, and in the left cerebellum most consistent with central embolic source. She was restarted on Xarelto. She was admitted to [facility] in November 2020 with aphasia. CT angio of the head and neck showed severe narrowing of the left middle cerebral artery and moderate to severe narrowing of the left  posterior cerebral artery.  She was evaluated by neurology and underwent full stroke work-up. Her stroke was felt to be most likely related to her intracranial atherosclerotic disease and a baby aspirin was added to her Xarelto. Cardiac monitor March 2021 with sinus with PACs. She has since undergone treatment of a left MCA stenosis and has been on Brilinta. ASA was stopped. Echo November 2021 with normal LV function, normally functioning AVR with trivial PVL. Most recent echo March 2024 with mild aortic valve insufficiency, difficult to characterize due to prosthetic mitral valve artifact. She was seen in our office in May 2024 and reported dizziness.   She is here today for follow up. The patient denies any chest pain, dyspnea, palpitations, lower extremity edema, orthopnea, PND, dizziness, near syncope or syncope. She is feeling well overall.   Primary Care Physician: Thana Ates, MD  Past Medical History:  Diagnosis Date   Acute CVA (cerebrovascular accident) (HCC) 01/24/2019   Anxiety    Arthritis    knees   Atrial fibrillation (HCC)    Breast cancer (HCC)    CAD (coronary artery disease)    a. pre TAVR cath 2020 mild nonobstructive CAD in the LAD and CX with moderate nononstructive disease in the mid-distal RCA and PDA.   Cancer (HCC)    left breast   Cataracts, bilateral    CKD (chronic kidney disease), stage III (HCC)    CVA (cerebral vascular accident) (HCC)    Depression    Diabetes mellitus    DVT (deep venous thrombosis) (HCC)    hx of  Dyspnea    with exertion   Full dentures    GERD (gastroesophageal reflux disease)    Groin hematoma    Hearing aid worn    B/L   History of TIA (transient ischemic attack)    mini stroke with vision problems   Hypercholesterolemia    Hypertension    Hypertensive nephropathy    MCI (mild cognitive impairment)    Memory loss    Middle cerebral artery stenosis, left    Mitral regurgitation    Paroxysmal atrial fibrillation (HCC)     Pneumonia    PONV (postoperative nausea and vomiting)    Premature atrial contraction    S/P Maze operation for atrial fibrillation 10/11/2012   Complete bilateral atrial lesion set using bipolar radiofrequency and cryothermy ablation with clipping of LA appendage   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   26mm Sorin Memo 3D ring annuloplasty with 26 mm Edwards mc3 tricuspid ring annuloplasty    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   s/p TAVR with a 23 Edwards Sapien 3 Ultra THV via the TF approach   Thyroid nodule    Tricuspid regurgitation    Wears glasses     Past Surgical History:  Procedure Laterality Date   ABDOMINAL AORTOGRAM W/LOWER EXTREMITY N/A 03/14/2022   Procedure: ABDOMINAL AORTOGRAM W/LOWER EXTREMITY;  Surgeon: Leonie Douglas, MD;  Location: [facility] INVASIVE CV LAB;  Service: Cardiovascular;  Laterality: N/A;   ARTERY REPAIR Right 01/22/2019   Procedure: Right Common Femoral Artery Repair;  Surgeon: Chuck Hint, MD;  Location: [facility] OR;  Service: Vascular;  Laterality: Right;   CARDIAC CATHETERIZATION  09-05-12   CARDIOVERSION N/A 07/31/2012   Procedure: CARDIOVERSION;  Surgeon: Wendall Stade, MD;  Location: [facility] ENDOSCOPY;  Service: Cardiovascular;  Laterality: N/A;   CATARACT EXTRACTION W/ INTRAOCULAR LENS  IMPLANT, BILATERAL     CESAREAN SECTION     x 2   COLONOSCOPY WITH PROPOFOL N/A 11/28/2014   Procedure: COLONOSCOPY WITH PROPOFOL;  Surgeon: Jeani Hawking, MD;  Location: [facility] ENDOSCOPY;  Service: Endoscopy;  Laterality: N/A;   COLONOSCOPY WITH PROPOFOL N/A 01/22/2021   Procedure: COLONOSCOPY WITH PROPOFOL;  Surgeon: Jeani Hawking, MD;  Location: [facility] ENDOSCOPY;  Service: Endoscopy;  Laterality: N/A;   DILATION AND CURETTAGE OF UTERUS     FEMORAL ARTERY EXPLORATION Right 01/22/2019   Procedure: RIGHT GROIN EXPLORATION with evacuation of hematoma;  Surgeon: Chuck Hint, MD;  Location: [facility] OR;  Service: Vascular;  Laterality: Right;    FLEXIBLE SIGMOIDOSCOPY N/A 12/18/2015   Procedure: FLEXIBLE SIGMOIDOSCOPY;  Surgeon: Jeani Hawking, MD;  Location: [facility] ENDOSCOPY;  Service: Endoscopy;  Laterality: N/A;   INTRAMEDULLARY (IM) NAIL INTERTROCHANTERIC Right 05/28/2022   Procedure: INTRAMEDULLARY (IM) NAIL INTERTROCHANTERIC;  Surgeon: Roby Lofts, MD;  Location: [facility] OR;  Service: Orthopedics;  Laterality: Right;   INTRAOPERATIVE TRANSESOPHAGEAL ECHOCARDIOGRAM N/A 10/11/2012   Procedure: INTRAOPERATIVE TRANSESOPHAGEAL ECHOCARDIOGRAM;  Surgeon: Purcell Nails, MD;  Location: [facility] OR;  Service: Open Heart Surgery;  Laterality: N/A;   IR ANGIO INTRA EXTRACRAN SEL COM CAROTID INNOMINATE BILAT MOD SED  05/21/2019   IR ANGIO VERTEBRAL SEL VERTEBRAL BILAT MOD SED  05/21/2019   MASTECTOMY Left 1982   MAZE N/A 10/11/2012   Procedure: MAZE;  Surgeon: Purcell Nails, MD;  Location: [facility] OR;  Service: Open Heart Surgery;  Laterality: N/A;   MITRAL VALVE REPAIR N/A 10/11/2012   Procedure: MITRAL VALVE REPAIR (MVR);  Surgeon: Purcell Nails, MD;  Location: [facility] OR;  Service: Open Heart Surgery;  Laterality: N/A;   MULTIPLE TOOTH EXTRACTIONS     PERIPHERAL VASCULAR BALLOON ANGIOPLASTY Right 03/14/2022   Procedure: PERIPHERAL VASCULAR BALLOON ANGIOPLASTY;  Surgeon: Leonie Douglas, MD;  Location: [facility];  Service: Cardiovascular;  Laterality: Right;  Peroneal   PERIPHERAL VASCULAR INTERVENTION Right 03/14/2022   Procedure: PERIPHERAL VASCULAR INTERVENTION;  Surgeon: Leonie Douglas, MD;  Location: [facility];  Service: Cardiovascular;  Laterality: Right;  SFA   RADIOLOGY WITH ANESTHESIA N/A 06/19/2019   Procedure: STENTING;  Surgeon: Julieanne Cotton, MD;  Location: [facility] OR;  Service: Radiology;  Laterality: N/A;   RIGHT HEART CATH AND CORONARY ANGIOGRAPHY N/A 12/24/2018   Procedure: RIGHT HEART CATH AND CORONARY ANGIOGRAPHY;  Surgeon: Kathleene Hazel, MD;  Location: [facility];  Service: Cardiovascular;  Laterality: N/A;    TEE WITHOUT CARDIOVERSION N/A 07/31/2012   Procedure: TRANSESOPHAGEAL ECHOCARDIOGRAM (TEE);  Surgeon: Wendall Stade, MD;  Location: [facility] ENDOSCOPY;  Service: Cardiovascular;  Laterality: N/A;   TEE WITHOUT CARDIOVERSION N/A 01/22/2019   Procedure: TRANSESOPHAGEAL ECHOCARDIOGRAM (TEE);  Surgeon: Kathleene Hazel, MD;  Location: [facility] INVASIVE CV LAB;  Service: Open Heart Surgery;  Laterality: N/A;   TRANSCATHETER AORTIC VALVE REPLACEMENT, TRANSFEMORAL N/A 01/22/2019   Procedure: TRANSCATHETER AORTIC VALVE REPLACEMENT, TRANSFEMORAL;  Surgeon: Kathleene Hazel, MD;  Location: [facility] CV LAB;  Service: Open Heart Surgery;  Laterality: N/A;   TRICUSPID VALVE REPLACEMENT N/A 10/11/2012   Procedure: TRICUSPID VALVE REPAIR;  Surgeon: Purcell Nails, MD;  Location: [facility] OR;  Service: Open Heart Surgery;  Laterality: N/A;   TUBAL LIGATION      Current Outpatient Medications  Medication Sig Dispense Refill   acetaminophen (TYLENOL) 500 MG tablet Take 1,000 mg by mouth every 6 (six) hours as needed for moderate pain or headache.     apixaban (ELIQUIS) 2.5 MG TABS tablet Take 1 tablet (2.5 mg total) by mouth 2 (two) times daily. 60 tablet 5   aspirin EC 81 MG tablet Take 81 mg by mouth daily. Swallow whole.     atorvastatin (LIPITOR) 80 MG tablet Take 1 tablet by mouth once daily 90 tablet 3   Calcium Carb-Cholecalciferol (CALCIUM 600 + D PO) Take 1 tablet by mouth 2 (two) times daily.     docusate sodium (COLACE) 100 MG capsule Take 200 mg by mouth at bedtime.     donepezil (ARICEPT) 5 MG tablet Take 1 tablet (5 mg total) by mouth daily with breakfast. 30 tablet 6   ferrous sulfate 325 (65 FE) MG EC tablet Take 1 tablet (325 mg total) by mouth 2 (two) times daily. 20 tablet 0   HYDROcodone-acetaminophen (NORCO/VICODIN) 5-325 MG tablet Take 1 tablet by mouth every 6 (six) hours as needed for severe pain. 30 tablet 0   insulin detemir (LEVEMIR) 100 UNIT/ML injection Inject 0.15 mLs (15 Units total)  into the skin 2 (two) times daily. 10 mL 11   lidocaine (LIDODERM) 5 % Place 1 patch onto the skin daily. Remove & Discard patch within 12 hours or as directed by MD (Patient taking differently: Place 1 patch onto the skin daily as needed (For pain).) 30 patch 0   Multiple Vitamins-Minerals (MULTIVITAMIN WITH MINERALS) tablet Take 1 tablet by mouth daily.     omeprazole (PRILOSEC) 40 MG capsule Take 40 mg by mouth daily before breakfast.      ondansetron (ZOFRAN) 4 MG tablet Take 4 mg by mouth every 8 (eight) hours as  needed for nausea or vomiting.     polyethylene glycol powder (MIRALAX) 17 GM/SCOOP powder Take 17 g by mouth 2 (two) times daily as needed for moderate constipation or mild constipation. 255 g 2   senna-docusate (SENOKOT-S) 8.6-50 MG tablet Take 1 tablet by mouth 2 (two) times daily. 30 tablet 0   venlafaxine XR (EFFEXOR-XR) 150 MG 24 hr capsule Take 150 mg by mouth daily with breakfast. Take with 75 mg capsule to equal 225 mg daily     venlafaxine XR (EFFEXOR-XR) 75 MG 24 hr capsule Take 75 mg by mouth daily with breakfast. Take with 150 mg capsule to equal 225 mg daily     vitamin B-12 (CYANOCOBALAMIN) 500 MCG tablet Take 500 mcg by mouth every Monday.     nitroGLYCERIN (NITROSTAT) 0.4 MG SL tablet Place 1 tablet (0.4 mg total) under the tongue every 5 (five) minutes as needed for chest pain. 25 tablet 3   No current facility-administered medications for this visit.    Allergies  Allergen Reactions   Buspirone Hcl     lethargic/tired   Metformin And Related Diarrhea   Ozempic (0.25 Or 0.5 Mg-Dose) [Semaglutide(0.25 Or 0.5mg -Dos)]     Other reaction(s): nausea   Ultram [Tramadol] Nausea And Vomiting   Nickel Rash    Pt unable to wear jewelry made of nickel.   Pneumococcal Vaccines Swelling and Rash    At injection set    Social History   Socioeconomic History   Marital status: Widowed    Spouse name: Not on file   Number of children: 3   Years of education: 12    Highest education level: High school graduate  Occupational History   Occupation: Copywriter, advertising  Tobacco Use   Smoking status: Never    Passive exposure: Never   Smokeless tobacco: Never  Vaping Use   Vaping status: Never Used  Substance and Sexual Activity   Alcohol use: No   Drug use: No   Sexual activity: Never  Other Topics Concern   Not on file  Social History Narrative   3 cups coffee per day   Right handed   Lives alone. Daughter, [name] lives next door.   Social Determinants of Health   Financial Resource Strain: Not on file  Food Insecurity: No Food Insecurity (12/20/2021)   Hunger Vital Sign    Worried About Running Out of Food in the Last Year: Never true    Ran Out of Food in the Last Year: Never true  Transportation Needs: No Transportation Needs (12/20/2021)   PRAPARE - Administrator, Civil Service (Medical): No    Lack of Transportation (Non-Medical): No  Physical Activity: Not on file  Stress: Not on file  Social Connections: Unknown (08/10/2021)   [facility] from [facility]   Social Network    Social Network: Not on file  Intimate Partner Violence: Not At Risk (12/20/2021)   Humiliation, Afraid, Rape, and Kick questionnaire    Fear of Current or Ex-Partner: No    Emotionally Abused: No    Physically Abused: No    Sexually Abused: No    Family History  Problem Relation Age of Onset   Alcohol abuse Mother    Cancer Father        prostate   Hypertension Father    Heart attack Paternal Grandfather    Stroke Neg Hx     Review of Systems:  As stated in the HPI and otherwise negative.  BP 128/60   Pulse 67   Ht 5\' 7"  (1.702 m)   Wt 58.8 kg   SpO2 97%   BMI 20.30 kg/m   Physical Examination: General: Well developed, well nourished, NAD  HEENT: OP clear, mucus membranes moist  SKIN: warm, dry. No rashes. Neuro: No focal deficits  Musculoskeletal: Muscle strength 5/5 all ext  Psychiatric:  Mood and affect normal  Neck: No JVD, no carotid bruits, no thyromegaly, no lymphadenopathy.  Lungs:Clear bilaterally, no wheezes, rhonci, crackles Cardiovascular: Regular rate and rhythm. No murmurs, gallops or rubs. Abdomen:Soft. Bowel sounds present. Non-tender.  Extremities: No lower extremity edema. Pulses are 2 + in the bilateral DP/PT.  Echo March 2024:   1. There appears to be paravaular leak. In the PSAX image series  regurgitation looks significant but in the setting of prosthetic mitral  valve artifact. The aortic valve has been repaired/replaced. Aortic valve  regurgitation may be mild up to moderate  but difficult to characterize on this study. There is a 23 mm Sapien  prosthetic (TAVR) valve present in the aortic position. Procedure Date:  01/22/2019. Aortic valve mean gradient measures 13.0 mmHg.   2. The mitral valve has been repaired/replaced. Trivial mitral valve  regurgitation. Mild mitral stenosis. The mean mitral valve gradient is 4.0  mmHg with average heart rate of 76 bpm. There is a 26 mm Memo prosthetic  annuloplasty ring present in the  mitral position. Procedure Date: 10/11/2012.   3. 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   4. Left ventricular ejection fraction, by estimation, is 65 to 70%. Left  ventricular ejection fraction by 3D volume is 70 %. The left ventricle has  normal function. The left ventricle has no regional wall motion  abnormalities. There is moderate  concentric left ventricular hypertrophy. Left ventricular diastolic  parameters are indeterminate.   5. Right ventricular systolic function is hyperdynamic. The right  ventricular size is normal.   EKG:  EKG is not ordered today. The ekg ordered today demonstrates   Recent Labs: 05/26/2022: TSH 2.375 05/28/2022: Magnesium 2.0 06/02/2022: ALT 24; BUN 27; Creatinine, Ser 0.77; Hemoglobin 8.2; Platelets 186; Potassium 4.2; Sodium 137   Lipid  Panel    Component Value Date/Time   CHOL 112 01/23/2019 1530   TRIG 149 01/23/2019 1530   HDL 38 (L) 01/23/2019 1530   CHOLHDL 2.9 01/23/2019 1530   VLDL 30 01/23/2019 1530   LDLCALC 44 01/23/2019 1530     Wt Readings from Last 3 Encounters:  11/24/22 58.8 kg  08/16/22 59.7 kg  06/20/22 59.1 kg    Assessment and Plan:   1. Severe Aortic stenosis s/p TAVR: She is doing well post TAVR. Mild  PVL by echo March 2024. Continue Xarelto.    2. Mitral regurgitation: She is s/p MV ring in 2014, stable by echo 2024.   3. Tricuspid regurgitation: She is s/p TV ring in 2014. Stable by echo in 2024  4. CAD without angina: Mild CAD by cath in 2020. No chest pain suggestive of angina. Continue statin.   5. HTN: BP is well controlled. No changes today  6. Atrial fibrillation, paroxysmal: Sinus today on exam. Continue Xarelto.   7. Palpitations: No recent palpitations. Cardiac monitor March 2021 with sinus with PACs.   Labs/ tests ordered today include:  No orders of the defined types were placed in this encounter.  Disposition:   F/U with me one year  Signed, Cristal Deer  Clifton James, MD 11/24/2022 2:53 PM    [facility] [address] [place], [place], [place]  [address] Phone: [phone]; Fax: [phone]

## 2022-12-09 DIAGNOSIS — Z794 Long term (current) use of insulin: Secondary | ICD-10-CM | POA: Diagnosis not present

## 2022-12-09 DIAGNOSIS — E1121 Type 2 diabetes mellitus with diabetic nephropathy: Secondary | ICD-10-CM | POA: Diagnosis not present

## 2022-12-20 ENCOUNTER — Telehealth: Payer: Self-pay | Admitting: Psychiatry

## 2022-12-20 NOTE — Telephone Encounter (Signed)
Pt called to confirm [facility] has her correct mailing address

## 2022-12-29 DIAGNOSIS — N1831 Chronic kidney disease, stage 3a: Secondary | ICD-10-CM | POA: Diagnosis not present

## 2022-12-29 DIAGNOSIS — E1121 Type 2 diabetes mellitus with diabetic nephropathy: Secondary | ICD-10-CM | POA: Diagnosis not present

## 2022-12-29 DIAGNOSIS — E1122 Type 2 diabetes mellitus with diabetic chronic kidney disease: Secondary | ICD-10-CM | POA: Diagnosis not present

## 2022-12-29 DIAGNOSIS — Z8781 Personal history of (healed) traumatic fracture: Secondary | ICD-10-CM | POA: Diagnosis not present

## 2022-12-29 DIAGNOSIS — E559 Vitamin D deficiency, unspecified: Secondary | ICD-10-CM | POA: Diagnosis not present

## 2022-12-29 DIAGNOSIS — E7800 Pure hypercholesterolemia, unspecified: Secondary | ICD-10-CM | POA: Diagnosis not present

## 2023-01-12 ENCOUNTER — Other Ambulatory Visit: Payer: Self-pay

## 2023-01-12 MED ORDER — DONEPEZIL HCL 5 MG PO TABS: 5.0000 mg | ORAL_TABLET | Freq: Every day | ORAL | 12 refills | Status: DC

## 2023-01-26 DIAGNOSIS — E1121 Type 2 diabetes mellitus with diabetic nephropathy: Secondary | ICD-10-CM | POA: Diagnosis not present

## 2023-02-02 ENCOUNTER — Other Ambulatory Visit: Payer: Medicare Other

## 2023-02-20 ENCOUNTER — Other Ambulatory Visit: Payer: Self-pay | Admitting: Internal Medicine

## 2023-02-20 DIAGNOSIS — Z780 Asymptomatic menopausal state: Secondary | ICD-10-CM

## 2023-02-28 DIAGNOSIS — R339 Retention of urine, unspecified: Secondary | ICD-10-CM | POA: Diagnosis not present

## 2023-02-28 DIAGNOSIS — K5901 Slow transit constipation: Secondary | ICD-10-CM | POA: Diagnosis not present

## 2023-02-28 DIAGNOSIS — Z789 Other specified health status: Secondary | ICD-10-CM | POA: Diagnosis not present

## 2023-02-28 DIAGNOSIS — N8111 Cystocele, midline: Secondary | ICD-10-CM | POA: Diagnosis not present

## 2023-03-13 ENCOUNTER — Other Ambulatory Visit: Payer: Self-pay | Admitting: Cardiovascular Disease

## 2023-03-14 NOTE — Telephone Encounter (Signed)
Prescription refill request for Eliquis received. Indication:afib Last office visit:8/24 Scr:0.77  3/24 Age: 82 Weight:58.8  kg  Prescription refilled

## 2023-05-22 DIAGNOSIS — G3184 Mild cognitive impairment, so stated: Secondary | ICD-10-CM | POA: Diagnosis not present

## 2023-05-22 DIAGNOSIS — B351 Tinea unguium: Secondary | ICD-10-CM | POA: Diagnosis not present

## 2023-05-22 DIAGNOSIS — R21 Rash and other nonspecific skin eruption: Secondary | ICD-10-CM | POA: Diagnosis not present

## 2023-05-22 DIAGNOSIS — E1165 Type 2 diabetes mellitus with hyperglycemia: Secondary | ICD-10-CM | POA: Diagnosis not present

## 2023-05-22 DIAGNOSIS — E1169 Type 2 diabetes mellitus with other specified complication: Secondary | ICD-10-CM | POA: Diagnosis not present

## 2023-06-12 ENCOUNTER — Ambulatory Visit: Admitting: Podiatry

## 2023-06-19 ENCOUNTER — Ambulatory Visit (INDEPENDENT_AMBULATORY_CARE_PROVIDER_SITE_OTHER): Admitting: Podiatry

## 2023-06-19 ENCOUNTER — Encounter: Payer: Self-pay | Admitting: Podiatry

## 2023-06-19 DIAGNOSIS — M79675 Pain in left toe(s): Secondary | ICD-10-CM | POA: Diagnosis not present

## 2023-06-19 DIAGNOSIS — Z794 Long term (current) use of insulin: Secondary | ICD-10-CM

## 2023-06-19 DIAGNOSIS — M79674 Pain in right toe(s): Secondary | ICD-10-CM

## 2023-06-19 DIAGNOSIS — B351 Tinea unguium: Secondary | ICD-10-CM

## 2023-06-19 DIAGNOSIS — E118 Type 2 diabetes mellitus with unspecified complications: Secondary | ICD-10-CM | POA: Diagnosis not present

## 2023-06-19 NOTE — Progress Notes (Signed)
 This patient returns to my office for at risk foot care.  This patient requires this care by a professional since this patient will be at risk due to having diabetes and CKD. This patient is unable to cut nails herself since the patient cannot reach her nails.These nails are painful walking and wearing shoes.  This patient presents for at risk foot care today. Patient has not been seen in over 15 months.  She presents with female caregiver.  General Appearance  Alert, conversant and in no acute stress.  Vascular  Dorsalis pedis and posterior tibial  pulses are palpable  bilaterally.  Capillary return is within normal limits  bilaterally. Temperature is within normal limits  bilaterally.  Neurologic  Senn-Weinstein monofilament wire test within normal limits /diminished  bilaterally. Muscle power within normal limits bilaterally.  Nails Thick disfigured discolored nails with subungual debris  from hallux to fifth toes bilaterally. No evidence of bacterial infection or drainage bilaterally.  Orthopedic  No limitations of motion  feet .  No crepitus or effusions noted.  No bony pathology or digital deformities noted.  Skin  normotropic skin with no porokeratosis noted bilaterally.  No signs of infections or ulcers noted.     Onychomycosis  Pain in right toes  Pain in left toes  Consent was obtained for treatment procedures.   Mechanical debridement of nails 1-5  bilaterally performed with a nail nipper.  Filed with dremel without incident.    Return office visit    prn                 Told patient to return for periodic foot care and evaluation due to potential at risk complications.   Helane Gunther DPM

## 2023-06-21 DIAGNOSIS — Z7982 Long term (current) use of aspirin: Secondary | ICD-10-CM | POA: Diagnosis not present

## 2023-06-21 DIAGNOSIS — E1165 Type 2 diabetes mellitus with hyperglycemia: Secondary | ICD-10-CM | POA: Diagnosis not present

## 2023-06-21 DIAGNOSIS — Z9181 History of falling: Secondary | ICD-10-CM | POA: Diagnosis not present

## 2023-06-21 DIAGNOSIS — M810 Age-related osteoporosis without current pathological fracture: Secondary | ICD-10-CM | POA: Diagnosis not present

## 2023-06-21 DIAGNOSIS — N183 Chronic kidney disease, stage 3 unspecified: Secondary | ICD-10-CM | POA: Diagnosis not present

## 2023-06-21 DIAGNOSIS — B351 Tinea unguium: Secondary | ICD-10-CM | POA: Diagnosis not present

## 2023-06-21 DIAGNOSIS — I480 Paroxysmal atrial fibrillation: Secondary | ICD-10-CM | POA: Diagnosis not present

## 2023-06-21 DIAGNOSIS — Z556 Problems related to health literacy: Secondary | ICD-10-CM | POA: Diagnosis not present

## 2023-06-21 DIAGNOSIS — Z7901 Long term (current) use of anticoagulants: Secondary | ICD-10-CM | POA: Diagnosis not present

## 2023-06-21 DIAGNOSIS — H9193 Unspecified hearing loss, bilateral: Secondary | ICD-10-CM | POA: Diagnosis not present

## 2023-06-21 DIAGNOSIS — Z853 Personal history of malignant neoplasm of breast: Secondary | ICD-10-CM | POA: Diagnosis not present

## 2023-06-21 DIAGNOSIS — Z9012 Acquired absence of left breast and nipple: Secondary | ICD-10-CM | POA: Diagnosis not present

## 2023-06-21 DIAGNOSIS — R21 Rash and other nonspecific skin eruption: Secondary | ICD-10-CM | POA: Diagnosis not present

## 2023-06-21 DIAGNOSIS — K219 Gastro-esophageal reflux disease without esophagitis: Secondary | ICD-10-CM | POA: Diagnosis not present

## 2023-06-21 DIAGNOSIS — E1122 Type 2 diabetes mellitus with diabetic chronic kidney disease: Secondary | ICD-10-CM | POA: Diagnosis not present

## 2023-06-21 DIAGNOSIS — E1140 Type 2 diabetes mellitus with diabetic neuropathy, unspecified: Secondary | ICD-10-CM | POA: Diagnosis not present

## 2023-06-21 DIAGNOSIS — Z86718 Personal history of other venous thrombosis and embolism: Secondary | ICD-10-CM | POA: Diagnosis not present

## 2023-06-21 DIAGNOSIS — Z794 Long term (current) use of insulin: Secondary | ICD-10-CM | POA: Diagnosis not present

## 2023-06-27 DIAGNOSIS — M810 Age-related osteoporosis without current pathological fracture: Secondary | ICD-10-CM | POA: Diagnosis not present

## 2023-06-27 DIAGNOSIS — R21 Rash and other nonspecific skin eruption: Secondary | ICD-10-CM | POA: Diagnosis not present

## 2023-06-27 DIAGNOSIS — H9193 Unspecified hearing loss, bilateral: Secondary | ICD-10-CM | POA: Diagnosis not present

## 2023-06-27 DIAGNOSIS — Z7901 Long term (current) use of anticoagulants: Secondary | ICD-10-CM | POA: Diagnosis not present

## 2023-06-27 DIAGNOSIS — Z9181 History of falling: Secondary | ICD-10-CM | POA: Diagnosis not present

## 2023-06-27 DIAGNOSIS — Z9012 Acquired absence of left breast and nipple: Secondary | ICD-10-CM | POA: Diagnosis not present

## 2023-06-27 DIAGNOSIS — Z853 Personal history of malignant neoplasm of breast: Secondary | ICD-10-CM | POA: Diagnosis not present

## 2023-06-27 DIAGNOSIS — Z556 Problems related to health literacy: Secondary | ICD-10-CM | POA: Diagnosis not present

## 2023-06-27 DIAGNOSIS — I480 Paroxysmal atrial fibrillation: Secondary | ICD-10-CM | POA: Diagnosis not present

## 2023-06-27 DIAGNOSIS — E1165 Type 2 diabetes mellitus with hyperglycemia: Secondary | ICD-10-CM | POA: Diagnosis not present

## 2023-06-27 DIAGNOSIS — Z86718 Personal history of other venous thrombosis and embolism: Secondary | ICD-10-CM | POA: Diagnosis not present

## 2023-06-27 DIAGNOSIS — B351 Tinea unguium: Secondary | ICD-10-CM | POA: Diagnosis not present

## 2023-06-27 DIAGNOSIS — K219 Gastro-esophageal reflux disease without esophagitis: Secondary | ICD-10-CM | POA: Diagnosis not present

## 2023-06-27 DIAGNOSIS — N183 Chronic kidney disease, stage 3 unspecified: Secondary | ICD-10-CM | POA: Diagnosis not present

## 2023-06-27 DIAGNOSIS — E1140 Type 2 diabetes mellitus with diabetic neuropathy, unspecified: Secondary | ICD-10-CM | POA: Diagnosis not present

## 2023-06-27 DIAGNOSIS — Z7982 Long term (current) use of aspirin: Secondary | ICD-10-CM | POA: Diagnosis not present

## 2023-06-27 DIAGNOSIS — Z794 Long term (current) use of insulin: Secondary | ICD-10-CM | POA: Diagnosis not present

## 2023-06-27 DIAGNOSIS — E1122 Type 2 diabetes mellitus with diabetic chronic kidney disease: Secondary | ICD-10-CM | POA: Diagnosis not present

## 2023-06-28 DIAGNOSIS — Z853 Personal history of malignant neoplasm of breast: Secondary | ICD-10-CM | POA: Diagnosis not present

## 2023-06-28 DIAGNOSIS — Z7901 Long term (current) use of anticoagulants: Secondary | ICD-10-CM | POA: Diagnosis not present

## 2023-06-28 DIAGNOSIS — Z7982 Long term (current) use of aspirin: Secondary | ICD-10-CM | POA: Diagnosis not present

## 2023-06-28 DIAGNOSIS — E1122 Type 2 diabetes mellitus with diabetic chronic kidney disease: Secondary | ICD-10-CM | POA: Diagnosis not present

## 2023-06-28 DIAGNOSIS — Z9181 History of falling: Secondary | ICD-10-CM | POA: Diagnosis not present

## 2023-06-28 DIAGNOSIS — Z794 Long term (current) use of insulin: Secondary | ICD-10-CM | POA: Diagnosis not present

## 2023-06-28 DIAGNOSIS — I480 Paroxysmal atrial fibrillation: Secondary | ICD-10-CM | POA: Diagnosis not present

## 2023-06-28 DIAGNOSIS — M810 Age-related osteoporosis without current pathological fracture: Secondary | ICD-10-CM | POA: Diagnosis not present

## 2023-06-28 DIAGNOSIS — B351 Tinea unguium: Secondary | ICD-10-CM | POA: Diagnosis not present

## 2023-06-28 DIAGNOSIS — K219 Gastro-esophageal reflux disease without esophagitis: Secondary | ICD-10-CM | POA: Diagnosis not present

## 2023-06-28 DIAGNOSIS — N183 Chronic kidney disease, stage 3 unspecified: Secondary | ICD-10-CM | POA: Diagnosis not present

## 2023-06-28 DIAGNOSIS — E1165 Type 2 diabetes mellitus with hyperglycemia: Secondary | ICD-10-CM | POA: Diagnosis not present

## 2023-06-28 DIAGNOSIS — R21 Rash and other nonspecific skin eruption: Secondary | ICD-10-CM | POA: Diagnosis not present

## 2023-06-28 DIAGNOSIS — E1140 Type 2 diabetes mellitus with diabetic neuropathy, unspecified: Secondary | ICD-10-CM | POA: Diagnosis not present

## 2023-06-28 DIAGNOSIS — Z86718 Personal history of other venous thrombosis and embolism: Secondary | ICD-10-CM | POA: Diagnosis not present

## 2023-06-28 DIAGNOSIS — Z9012 Acquired absence of left breast and nipple: Secondary | ICD-10-CM | POA: Diagnosis not present

## 2023-06-28 DIAGNOSIS — Z556 Problems related to health literacy: Secondary | ICD-10-CM | POA: Diagnosis not present

## 2023-06-28 DIAGNOSIS — H9193 Unspecified hearing loss, bilateral: Secondary | ICD-10-CM | POA: Diagnosis not present

## 2023-07-04 DIAGNOSIS — Z7982 Long term (current) use of aspirin: Secondary | ICD-10-CM | POA: Diagnosis not present

## 2023-07-04 DIAGNOSIS — Z86718 Personal history of other venous thrombosis and embolism: Secondary | ICD-10-CM | POA: Diagnosis not present

## 2023-07-04 DIAGNOSIS — Z794 Long term (current) use of insulin: Secondary | ICD-10-CM | POA: Diagnosis not present

## 2023-07-04 DIAGNOSIS — K219 Gastro-esophageal reflux disease without esophagitis: Secondary | ICD-10-CM | POA: Diagnosis not present

## 2023-07-04 DIAGNOSIS — Z9181 History of falling: Secondary | ICD-10-CM | POA: Diagnosis not present

## 2023-07-04 DIAGNOSIS — Z7901 Long term (current) use of anticoagulants: Secondary | ICD-10-CM | POA: Diagnosis not present

## 2023-07-04 DIAGNOSIS — B351 Tinea unguium: Secondary | ICD-10-CM | POA: Diagnosis not present

## 2023-07-04 DIAGNOSIS — Z556 Problems related to health literacy: Secondary | ICD-10-CM | POA: Diagnosis not present

## 2023-07-04 DIAGNOSIS — Z9012 Acquired absence of left breast and nipple: Secondary | ICD-10-CM | POA: Diagnosis not present

## 2023-07-04 DIAGNOSIS — H9193 Unspecified hearing loss, bilateral: Secondary | ICD-10-CM | POA: Diagnosis not present

## 2023-07-04 DIAGNOSIS — I480 Paroxysmal atrial fibrillation: Secondary | ICD-10-CM | POA: Diagnosis not present

## 2023-07-04 DIAGNOSIS — E1140 Type 2 diabetes mellitus with diabetic neuropathy, unspecified: Secondary | ICD-10-CM | POA: Diagnosis not present

## 2023-07-04 DIAGNOSIS — M810 Age-related osteoporosis without current pathological fracture: Secondary | ICD-10-CM | POA: Diagnosis not present

## 2023-07-04 DIAGNOSIS — Z853 Personal history of malignant neoplasm of breast: Secondary | ICD-10-CM | POA: Diagnosis not present

## 2023-07-04 DIAGNOSIS — N183 Chronic kidney disease, stage 3 unspecified: Secondary | ICD-10-CM | POA: Diagnosis not present

## 2023-07-04 DIAGNOSIS — R21 Rash and other nonspecific skin eruption: Secondary | ICD-10-CM | POA: Diagnosis not present

## 2023-07-04 DIAGNOSIS — E1122 Type 2 diabetes mellitus with diabetic chronic kidney disease: Secondary | ICD-10-CM | POA: Diagnosis not present

## 2023-07-04 DIAGNOSIS — E1165 Type 2 diabetes mellitus with hyperglycemia: Secondary | ICD-10-CM | POA: Diagnosis not present

## 2023-07-10 DIAGNOSIS — E1140 Type 2 diabetes mellitus with diabetic neuropathy, unspecified: Secondary | ICD-10-CM | POA: Diagnosis not present

## 2023-07-10 DIAGNOSIS — Z9181 History of falling: Secondary | ICD-10-CM | POA: Diagnosis not present

## 2023-07-10 DIAGNOSIS — M810 Age-related osteoporosis without current pathological fracture: Secondary | ICD-10-CM | POA: Diagnosis not present

## 2023-07-10 DIAGNOSIS — K219 Gastro-esophageal reflux disease without esophagitis: Secondary | ICD-10-CM | POA: Diagnosis not present

## 2023-07-10 DIAGNOSIS — Z7982 Long term (current) use of aspirin: Secondary | ICD-10-CM | POA: Diagnosis not present

## 2023-07-10 DIAGNOSIS — Z9012 Acquired absence of left breast and nipple: Secondary | ICD-10-CM | POA: Diagnosis not present

## 2023-07-10 DIAGNOSIS — Z853 Personal history of malignant neoplasm of breast: Secondary | ICD-10-CM | POA: Diagnosis not present

## 2023-07-10 DIAGNOSIS — Z7901 Long term (current) use of anticoagulants: Secondary | ICD-10-CM | POA: Diagnosis not present

## 2023-07-10 DIAGNOSIS — E1122 Type 2 diabetes mellitus with diabetic chronic kidney disease: Secondary | ICD-10-CM | POA: Diagnosis not present

## 2023-07-10 DIAGNOSIS — Z86718 Personal history of other venous thrombosis and embolism: Secondary | ICD-10-CM | POA: Diagnosis not present

## 2023-07-10 DIAGNOSIS — N183 Chronic kidney disease, stage 3 unspecified: Secondary | ICD-10-CM | POA: Diagnosis not present

## 2023-07-10 DIAGNOSIS — E1165 Type 2 diabetes mellitus with hyperglycemia: Secondary | ICD-10-CM | POA: Diagnosis not present

## 2023-07-10 DIAGNOSIS — Z794 Long term (current) use of insulin: Secondary | ICD-10-CM | POA: Diagnosis not present

## 2023-07-10 DIAGNOSIS — Z556 Problems related to health literacy: Secondary | ICD-10-CM | POA: Diagnosis not present

## 2023-07-10 DIAGNOSIS — H9193 Unspecified hearing loss, bilateral: Secondary | ICD-10-CM | POA: Diagnosis not present

## 2023-07-10 DIAGNOSIS — I480 Paroxysmal atrial fibrillation: Secondary | ICD-10-CM | POA: Diagnosis not present

## 2023-07-10 DIAGNOSIS — B351 Tinea unguium: Secondary | ICD-10-CM | POA: Diagnosis not present

## 2023-07-10 DIAGNOSIS — R21 Rash and other nonspecific skin eruption: Secondary | ICD-10-CM | POA: Diagnosis not present

## 2023-07-17 DIAGNOSIS — E1140 Type 2 diabetes mellitus with diabetic neuropathy, unspecified: Secondary | ICD-10-CM | POA: Diagnosis not present

## 2023-07-17 DIAGNOSIS — N183 Chronic kidney disease, stage 3 unspecified: Secondary | ICD-10-CM | POA: Diagnosis not present

## 2023-07-17 DIAGNOSIS — I480 Paroxysmal atrial fibrillation: Secondary | ICD-10-CM | POA: Diagnosis not present

## 2023-07-17 DIAGNOSIS — Z794 Long term (current) use of insulin: Secondary | ICD-10-CM | POA: Diagnosis not present

## 2023-07-17 DIAGNOSIS — E1165 Type 2 diabetes mellitus with hyperglycemia: Secondary | ICD-10-CM | POA: Diagnosis not present

## 2023-07-17 DIAGNOSIS — Z86718 Personal history of other venous thrombosis and embolism: Secondary | ICD-10-CM | POA: Diagnosis not present

## 2023-07-17 DIAGNOSIS — B351 Tinea unguium: Secondary | ICD-10-CM | POA: Diagnosis not present

## 2023-07-17 DIAGNOSIS — Z853 Personal history of malignant neoplasm of breast: Secondary | ICD-10-CM | POA: Diagnosis not present

## 2023-07-17 DIAGNOSIS — K219 Gastro-esophageal reflux disease without esophagitis: Secondary | ICD-10-CM | POA: Diagnosis not present

## 2023-07-17 DIAGNOSIS — M810 Age-related osteoporosis without current pathological fracture: Secondary | ICD-10-CM | POA: Diagnosis not present

## 2023-07-17 DIAGNOSIS — Z9181 History of falling: Secondary | ICD-10-CM | POA: Diagnosis not present

## 2023-07-17 DIAGNOSIS — E1122 Type 2 diabetes mellitus with diabetic chronic kidney disease: Secondary | ICD-10-CM | POA: Diagnosis not present

## 2023-07-17 DIAGNOSIS — Z7901 Long term (current) use of anticoagulants: Secondary | ICD-10-CM | POA: Diagnosis not present

## 2023-07-17 DIAGNOSIS — Z556 Problems related to health literacy: Secondary | ICD-10-CM | POA: Diagnosis not present

## 2023-07-17 DIAGNOSIS — Z9012 Acquired absence of left breast and nipple: Secondary | ICD-10-CM | POA: Diagnosis not present

## 2023-07-17 DIAGNOSIS — H9193 Unspecified hearing loss, bilateral: Secondary | ICD-10-CM | POA: Diagnosis not present

## 2023-07-17 DIAGNOSIS — Z7982 Long term (current) use of aspirin: Secondary | ICD-10-CM | POA: Diagnosis not present

## 2023-07-17 DIAGNOSIS — R21 Rash and other nonspecific skin eruption: Secondary | ICD-10-CM | POA: Diagnosis not present

## 2023-07-18 ENCOUNTER — Other Ambulatory Visit: Payer: Self-pay | Admitting: Internal Medicine

## 2023-07-18 DIAGNOSIS — Z1231 Encounter for screening mammogram for malignant neoplasm of breast: Secondary | ICD-10-CM

## 2023-07-25 DIAGNOSIS — Z794 Long term (current) use of insulin: Secondary | ICD-10-CM | POA: Diagnosis not present

## 2023-07-25 DIAGNOSIS — Z86718 Personal history of other venous thrombosis and embolism: Secondary | ICD-10-CM | POA: Diagnosis not present

## 2023-07-25 DIAGNOSIS — Z853 Personal history of malignant neoplasm of breast: Secondary | ICD-10-CM | POA: Diagnosis not present

## 2023-07-25 DIAGNOSIS — Z9181 History of falling: Secondary | ICD-10-CM | POA: Diagnosis not present

## 2023-07-25 DIAGNOSIS — K219 Gastro-esophageal reflux disease without esophagitis: Secondary | ICD-10-CM | POA: Diagnosis not present

## 2023-07-25 DIAGNOSIS — E1165 Type 2 diabetes mellitus with hyperglycemia: Secondary | ICD-10-CM | POA: Diagnosis not present

## 2023-07-25 DIAGNOSIS — I480 Paroxysmal atrial fibrillation: Secondary | ICD-10-CM | POA: Diagnosis not present

## 2023-07-25 DIAGNOSIS — N183 Chronic kidney disease, stage 3 unspecified: Secondary | ICD-10-CM | POA: Diagnosis not present

## 2023-07-25 DIAGNOSIS — M810 Age-related osteoporosis without current pathological fracture: Secondary | ICD-10-CM | POA: Diagnosis not present

## 2023-07-25 DIAGNOSIS — Z556 Problems related to health literacy: Secondary | ICD-10-CM | POA: Diagnosis not present

## 2023-07-25 DIAGNOSIS — E1122 Type 2 diabetes mellitus with diabetic chronic kidney disease: Secondary | ICD-10-CM | POA: Diagnosis not present

## 2023-07-25 DIAGNOSIS — Z7901 Long term (current) use of anticoagulants: Secondary | ICD-10-CM | POA: Diagnosis not present

## 2023-07-25 DIAGNOSIS — E1140 Type 2 diabetes mellitus with diabetic neuropathy, unspecified: Secondary | ICD-10-CM | POA: Diagnosis not present

## 2023-07-25 DIAGNOSIS — Z9012 Acquired absence of left breast and nipple: Secondary | ICD-10-CM | POA: Diagnosis not present

## 2023-07-25 DIAGNOSIS — H9193 Unspecified hearing loss, bilateral: Secondary | ICD-10-CM | POA: Diagnosis not present

## 2023-07-25 DIAGNOSIS — Z7982 Long term (current) use of aspirin: Secondary | ICD-10-CM | POA: Diagnosis not present

## 2023-07-25 DIAGNOSIS — R21 Rash and other nonspecific skin eruption: Secondary | ICD-10-CM | POA: Diagnosis not present

## 2023-07-25 DIAGNOSIS — B351 Tinea unguium: Secondary | ICD-10-CM | POA: Diagnosis not present

## 2023-08-07 ENCOUNTER — Ambulatory Visit
Admission: RE | Admit: 2023-08-07 | Discharge: 2023-08-07 | Disposition: A | Discharge status: Home / Self Care | Source: Ambulatory Visit | Attending: Internal Medicine | Admitting: Internal Medicine

## 2023-08-07 DIAGNOSIS — Z1231 Encounter for screening mammogram for malignant neoplasm of breast: Secondary | ICD-10-CM

## 2023-09-02 ENCOUNTER — Other Ambulatory Visit: Payer: Self-pay | Admitting: Physician Assistant

## 2023-09-07 ENCOUNTER — Other Ambulatory Visit: Payer: Self-pay | Admitting: Cardiovascular Disease

## 2023-09-07 DIAGNOSIS — I480 Paroxysmal atrial fibrillation: Secondary | ICD-10-CM

## 2023-10-12 ENCOUNTER — Other Ambulatory Visit: Payer: Medicare Other

## 2023-10-23 ENCOUNTER — Ambulatory Visit (HOSPITAL_BASED_OUTPATIENT_CLINIC_OR_DEPARTMENT_OTHER)
Admission: RE | Admit: 2023-10-23 | Discharge: 2023-10-23 | Disposition: A | Discharge status: Home / Self Care | Source: Ambulatory Visit | Attending: Internal Medicine | Admitting: Internal Medicine

## 2023-10-23 DIAGNOSIS — Z780 Asymptomatic menopausal state: Secondary | ICD-10-CM | POA: Insufficient documentation

## 2023-10-23 DIAGNOSIS — M810 Age-related osteoporosis without current pathological fracture: Secondary | ICD-10-CM | POA: Diagnosis not present

## 2023-10-26 DIAGNOSIS — I480 Paroxysmal atrial fibrillation: Secondary | ICD-10-CM | POA: Diagnosis not present

## 2023-10-26 DIAGNOSIS — E1169 Type 2 diabetes mellitus with other specified complication: Secondary | ICD-10-CM | POA: Diagnosis not present

## 2023-10-26 DIAGNOSIS — N1831 Chronic kidney disease, stage 3a: Secondary | ICD-10-CM | POA: Diagnosis not present

## 2023-10-26 DIAGNOSIS — I129 Hypertensive chronic kidney disease with stage 1 through stage 4 chronic kidney disease, or unspecified chronic kidney disease: Secondary | ICD-10-CM | POA: Diagnosis not present

## 2023-10-30 ENCOUNTER — Other Ambulatory Visit (HOSPITAL_COMMUNITY): Payer: Self-pay

## 2023-11-06 DIAGNOSIS — G3184 Mild cognitive impairment, so stated: Secondary | ICD-10-CM | POA: Diagnosis not present

## 2023-11-06 DIAGNOSIS — E1165 Type 2 diabetes mellitus with hyperglycemia: Secondary | ICD-10-CM | POA: Diagnosis not present

## 2023-11-06 DIAGNOSIS — N1831 Chronic kidney disease, stage 3a: Secondary | ICD-10-CM | POA: Diagnosis not present

## 2023-11-06 DIAGNOSIS — E1169 Type 2 diabetes mellitus with other specified complication: Secondary | ICD-10-CM | POA: Diagnosis not present

## 2023-11-06 DIAGNOSIS — K5909 Other constipation: Secondary | ICD-10-CM | POA: Diagnosis not present

## 2023-11-06 DIAGNOSIS — Z0000 Encounter for general adult medical examination without abnormal findings: Secondary | ICD-10-CM | POA: Diagnosis not present

## 2023-11-06 DIAGNOSIS — E559 Vitamin D deficiency, unspecified: Secondary | ICD-10-CM | POA: Diagnosis not present

## 2023-11-06 DIAGNOSIS — Z8781 Personal history of (healed) traumatic fracture: Secondary | ICD-10-CM | POA: Diagnosis not present

## 2023-11-06 DIAGNOSIS — I129 Hypertensive chronic kidney disease with stage 1 through stage 4 chronic kidney disease, or unspecified chronic kidney disease: Secondary | ICD-10-CM | POA: Diagnosis not present

## 2023-11-06 DIAGNOSIS — Z79899 Other long term (current) drug therapy: Secondary | ICD-10-CM | POA: Diagnosis not present

## 2023-11-08 DIAGNOSIS — E1165 Type 2 diabetes mellitus with hyperglycemia: Secondary | ICD-10-CM | POA: Diagnosis not present

## 2023-11-26 DIAGNOSIS — N1831 Chronic kidney disease, stage 3a: Secondary | ICD-10-CM | POA: Diagnosis not present

## 2023-11-26 DIAGNOSIS — I129 Hypertensive chronic kidney disease with stage 1 through stage 4 chronic kidney disease, or unspecified chronic kidney disease: Secondary | ICD-10-CM | POA: Diagnosis not present

## 2023-11-26 DIAGNOSIS — I480 Paroxysmal atrial fibrillation: Secondary | ICD-10-CM | POA: Diagnosis not present

## 2023-11-26 DIAGNOSIS — E1169 Type 2 diabetes mellitus with other specified complication: Secondary | ICD-10-CM | POA: Diagnosis not present

## 2023-12-15 DIAGNOSIS — E1165 Type 2 diabetes mellitus with hyperglycemia: Secondary | ICD-10-CM | POA: Diagnosis not present

## 2023-12-15 DIAGNOSIS — R319 Hematuria, unspecified: Secondary | ICD-10-CM | POA: Diagnosis not present

## 2023-12-18 ENCOUNTER — Other Ambulatory Visit: Payer: Self-pay | Admitting: Internal Medicine

## 2023-12-18 DIAGNOSIS — R310 Gross hematuria: Secondary | ICD-10-CM

## 2023-12-19 ENCOUNTER — Ambulatory Visit
Admission: RE | Admit: 2023-12-19 | Discharge: 2023-12-19 | Disposition: A | Discharge status: Home / Self Care | Source: Ambulatory Visit | Attending: Internal Medicine | Admitting: Internal Medicine

## 2023-12-19 DIAGNOSIS — K5730 Diverticulosis of large intestine without perforation or abscess without bleeding: Secondary | ICD-10-CM | POA: Diagnosis not present

## 2023-12-19 DIAGNOSIS — R310 Gross hematuria: Secondary | ICD-10-CM

## 2023-12-19 DIAGNOSIS — K8020 Calculus of gallbladder without cholecystitis without obstruction: Secondary | ICD-10-CM | POA: Diagnosis not present

## 2023-12-19 MED ORDER — IOPAMIDOL (ISOVUE-300) INJECTION 61%
108.0000 mL | Freq: Once | INTRAVENOUS | Status: AC | PRN
Start: 2023-12-19 — End: 2023-12-19
  Administered 2023-12-19: 108 mL via INTRAVENOUS

## 2023-12-19 MED ORDER — IOPAMIDOL (ISOVUE-300) INJECTION 61%
100.0000 mL | Freq: Once | INTRAVENOUS | Status: DC | PRN
Start: 2023-12-19 — End: 2023-12-19

## 2023-12-26 DIAGNOSIS — I129 Hypertensive chronic kidney disease with stage 1 through stage 4 chronic kidney disease, or unspecified chronic kidney disease: Secondary | ICD-10-CM | POA: Diagnosis not present

## 2023-12-26 DIAGNOSIS — N1831 Chronic kidney disease, stage 3a: Secondary | ICD-10-CM | POA: Diagnosis not present

## 2023-12-26 DIAGNOSIS — E1169 Type 2 diabetes mellitus with other specified complication: Secondary | ICD-10-CM | POA: Diagnosis not present

## 2023-12-26 DIAGNOSIS — I480 Paroxysmal atrial fibrillation: Secondary | ICD-10-CM | POA: Diagnosis not present

## 2024-01-13 ENCOUNTER — Other Ambulatory Visit: Payer: Self-pay | Admitting: Diagnostic Neuroimaging

## 2024-01-14 ENCOUNTER — Other Ambulatory Visit: Payer: Self-pay

## 2024-01-14 ENCOUNTER — Emergency Department (HOSPITAL_COMMUNITY)
Admission: EM | Admit: 2024-01-14 | Discharge: 2024-01-14 | Disposition: A | Discharge status: Home / Self Care | Attending: Emergency Medicine | Admitting: Emergency Medicine

## 2024-01-14 DIAGNOSIS — F0390 Unspecified dementia without behavioral disturbance: Secondary | ICD-10-CM | POA: Insufficient documentation

## 2024-01-14 DIAGNOSIS — Z7901 Long term (current) use of anticoagulants: Secondary | ICD-10-CM | POA: Diagnosis not present

## 2024-01-14 DIAGNOSIS — Z7982 Long term (current) use of aspirin: Secondary | ICD-10-CM | POA: Diagnosis not present

## 2024-01-14 DIAGNOSIS — R7309 Other abnormal glucose: Secondary | ICD-10-CM | POA: Diagnosis not present

## 2024-01-14 DIAGNOSIS — R319 Hematuria, unspecified: Secondary | ICD-10-CM | POA: Diagnosis not present

## 2024-01-14 LAB — URINALYSIS, W/ REFLEX TO CULTURE (INFECTION SUSPECTED)
Bilirubin Urine: NEGATIVE
Glucose, UA: >=500 mg/dL — AB
Ketones, ur: NEGATIVE mg/dL
Leukocytes,Ua: NEGATIVE
Nitrite: NEGATIVE
Protein, ur: 100 mg/dL — AB
RBC / HPF: >50 RBC/hpf (ref 0–5)
Specific Gravity, Urine: 1.014 (ref 1.005–1.030)
WBC, UA: >50 WBC/hpf (ref 0–5)
pH: 5 (ref 5.0–8.0)

## 2024-01-14 LAB — BASIC METABOLIC PANEL WITH GFR
Anion gap: 8 (ref 5–15)
BUN: 25 mg/dL — ABNORMAL HIGH (ref 8–23)
CO2: 28 mmol/L (ref 22–32)
Calcium: 9.2 mg/dL (ref 8.9–10.3)
Chloride: 102 mmol/L (ref 98–111)
Creatinine, Ser: 0.74 mg/dL (ref 0.44–1.00)
GFR, Estimated: >60 mL/min (ref >60)
Glucose, Bld: 199 mg/dL — ABNORMAL HIGH (ref 70–99)
Potassium: 4.6 mmol/L (ref 3.5–5.1)
Sodium: 137 mmol/L (ref 135–145)

## 2024-01-14 LAB — CBC
HCT: 38.7 % (ref 36.0–46.0)
Hemoglobin: 12.4 g/dL (ref 12.0–15.0)
MCH: 29.7 pg (ref 26.0–34.0)
MCHC: 32 g/dL (ref 30.0–36.0)
MCV: 92.8 fL (ref 80.0–100.0)
Platelets: 211 K/uL (ref 150–400)
RBC: 4.17 MIL/uL (ref 3.87–5.11)
RDW: 12.9 % (ref 11.5–15.5)
WBC: 6.6 K/uL (ref 4.0–10.5)
nRBC: 0 % (ref 0.0–0.2)

## 2024-01-14 LAB — CBG MONITORING, ED
Glucose-Capillary: 144 mg/dL — ABNORMAL HIGH (ref 70–99)
Glucose-Capillary: 80 mg/dL (ref 70–99)

## 2024-01-14 LAB — WET PREP, GENITAL
Clue Cells Wet Prep HPF POC: NONE SEEN
Sperm: NONE SEEN
Trich, Wet Prep: NONE SEEN
WBC, Wet Prep HPF POC: <10 (ref <10)
Yeast Wet Prep HPF POC: NONE SEEN

## 2024-01-14 NOTE — ED Triage Notes (Signed)
 Patient to ED by POV with daughters with c/o hematuria. She reports it is a chronic issues and is scheduled to see urology within a week. Daughter states they are not sure if it cause by her kidneys, bladder or Uterine prolapse. She denies N/V or weakness.

## 2024-01-14 NOTE — ED Provider Notes (Signed)
 [place] EMERGENCY DEPARTMENT AT [facility] Provider Note   CSN: [id number] Arrival date & time: 01/14/24  1117     Patient presents with: Hematuria   Rachel Santos is a 83 y.o. female.   HPI   83 year old female with past medical history of dementia, vaginal prolapse presents emergency department with concern for blood in the urine.  Patient is accompanied by her 3 daughters.  They state that starting this morning she has had noticeable and ongoing bright red urine.  Patient is passing urine freely, not complaining of any pain.  Patient has mild dementia but states that she feels at baseline.  She is anticoagulated on Eliquis .  She has had hematuria in the past which self resolved.  Initially had outpatient follow-up with urology and was using a pessary but is now scheduled for urology follow-up next week on Wednesday.  There has been no report of chest pain, shortness of breath, syncope.  Prior to Admission medications   Medication Sig Start Date End Date Taking? Authorizing Provider  acetaminophen  (TYLENOL ) 500 MG tablet Take 1,000 mg by mouth every 6 (six) hours as needed for moderate pain or headache.    [provider]  apixaban  (ELIQUIS ) 2.5 MG TABS tablet Take 1 tablet by mouth twice daily 09/07/23   Verlin Lonni BIRCH, MD  aspirin  EC 81 MG tablet Take 81 mg by mouth daily. Swallow whole.    [provider]  atorvastatin  (LIPITOR) 80 MG tablet Take 1 tablet by mouth once daily 09/06/22   Conte, Tessa N, PA-C  Calcium  Carb-Cholecalciferol (CALCIUM  600 + D PO) Take 1 tablet by mouth 2 (two) times daily.    [provider]  docusate sodium  (COLACE) 100 MG capsule Take 200 mg by mouth at bedtime.    [provider]  donepezil  (ARICEPT ) 5 MG tablet Take 1 tablet (5 mg total) by mouth daily with breakfast. 01/12/23   Penumalli, Eduard SAUNDERS, MD  ferrous sulfate  325 (65 FE) MG EC tablet Take 1 tablet (325 mg total) by mouth 2 (two)  times daily. 06/02/22 06/02/23  Cindy Garnette POUR, MD  HYDROcodone -acetaminophen  (NORCO/VICODIN) 5-325 MG tablet Take 1 tablet by mouth every 6 (six) hours as needed for severe pain. 05/30/22   Danton Lauraine LABOR, PA-C  insulin  detemir (LEVEMIR ) 100 UNIT/ML injection Inject 0.15 mLs (15 Units total) into the skin 2 (two) times daily. 06/02/22   Cindy Garnette POUR, MD  lidocaine  (LIDODERM ) 5 % Place 1 patch onto the skin daily. Remove & Discard patch within 12 hours or as directed by MD Patient taking differently: Place 1 patch onto the skin daily as needed (For pain). 12/24/21   [place], Camellia PARAS, DO  Multiple Vitamins-Minerals (MULTIVITAMIN WITH MINERALS) tablet Take 1 tablet by mouth daily.    [provider]  nitroGLYCERIN  (NITROSTAT ) 0.4 MG SL tablet Place 1 tablet (0.4 mg total) under the tongue every 5 (five) minutes as needed for chest pain. 09/08/20 10/17/22  Lelon Hamilton T, PA-C  omeprazole (PRILOSEC) 40 MG capsule Take 40 mg by mouth daily before breakfast.  06/28/12   [provider]  ondansetron  (ZOFRAN ) 4 MG tablet Take 4 mg by mouth every 8 (eight) hours as needed for nausea or vomiting. 12/14/21   [provider]  polyethylene glycol powder (MIRALAX ) 17 GM/SCOOP powder Take 17 g by mouth 2 (two) times daily as needed for moderate constipation or mild constipation. 10/20/21   Gonfa, Taye T, MD  senna-docusate (SENOKOT-S) 8.6-50 MG  tablet Take 1 tablet by mouth 2 (two) times daily. 06/02/22   Cindy Garnette POUR, MD  venlafaxine  XR (EFFEXOR -XR) 150 MG 24 hr capsule Take 150 mg by mouth daily with breakfast. Take with 75 mg capsule to equal 225 mg daily    [provider]  venlafaxine  XR (EFFEXOR -XR) 75 MG 24 hr capsule Take 75 mg by mouth daily with breakfast. Take with 150 mg capsule to equal 225 mg daily    [provider]  vitamin B-12 (CYANOCOBALAMIN ) 500 MCG tablet Take 500 mcg by mouth every Monday.    [provider]    Allergies: Buspirone  hcl, Metformin   and related, Ozempic (0.25 or 0.5 mg-dose) [semaglutide(0.25 or 0.5mg -dos)], Ultram  [tramadol ], Nickel, and Pneumococcal vaccines    Review of Systems  Constitutional:  Negative for fever.  Respiratory:  Negative for shortness of breath.   Cardiovascular:  Negative for chest pain.  Gastrointestinal:  Negative for abdominal pain, diarrhea and vomiting.  Genitourinary:  Positive for hematuria.  Skin:  Negative for rash.  Neurological:  Negative for headaches.    Updated Vital Signs BP (!) 164/79 (BP Location: Left Arm)   Pulse 65   Temp 97.7 F (36.5 C) (Oral)   Resp 17   Ht 5' 6 (1.676 m)   Wt 55 kg   SpO2 97%   BMI 19.57 kg/m   Physical Exam Vitals and nursing note reviewed.  Constitutional:      General: She is not in acute distress.    Appearance: Normal appearance.  HENT:     Head: Normocephalic.     Mouth/Throat:     Mouth: Mucous membranes are moist.  Cardiovascular:     Rate and Rhythm: Normal rate.  Pulmonary:     Effort: Pulmonary effort is normal. No respiratory distress.  Abdominal:     Palpations: Abdomen is soft.     Tenderness: There is no abdominal tenderness. There is no guarding.  Genitourinary:    General: Normal vulva.     Vagina: No vaginal discharge.  Skin:    General: Skin is warm.  Neurological:     Mental Status: She is alert and oriented to person, place, and time. Mental status is at baseline.  Psychiatric:        Mood and Affect: Mood normal.     (all labs ordered are listed, but only abnormal results are displayed) Labs Reviewed  BASIC METABOLIC PANEL WITH GFR - Abnormal; Notable for the following components:      Result Value   Glucose, Bld 199 (*)    BUN 25 (*)    All other components within normal limits  URINALYSIS, W/ REFLEX TO CULTURE (INFECTION SUSPECTED) - Abnormal; Notable for the following components:   Color, Urine RED (*)    APPearance CLOUDY (*)    Glucose, UA >=500 (*)    Hgb urine dipstick LARGE (*)    Protein,  ur 100 (*)    Bacteria, UA FEW (*)    All other components within normal limits  CBG MONITORING, ED - Abnormal; Notable for the following components:   Glucose-Capillary 144 (*)    All other components within normal limits  URINE CULTURE  CBC    EKG: None  Radiology: No results found.   Procedures   Medications Ordered in the ED - No data to display  Medical Decision Making Amount and/or Complexity of Data Reviewed Labs: ordered.   83 year old female presents emergency department with concern for hematuria.  Accompanied by her 3 daughters.  Patient is pleasantly demented and history is mainly obtained from the daughters.  Patient started having bright red urine today.  However she is passing urine freely, not complaining of any symptoms.  She does take Eliquis .  Vitals are normal and stable.  History of vaginal prolapse and has scheduled follow-up with urology next week for further evaluation.  Blood work is reassuring.  Hemoglobin, white blood cells, platelets are normal.  Kidney function is normal.  Urinalysis shows evidence of hematuria without infection.  On bedside exam, performed in conjunction with the daughters there is no noted external prolapse or vaginal bleeding/discharge.  Family is requesting rule outs of BV/yeast infection.  Self swab/wet prep ordered.  Wet prep is negative.  Patient will be discharged to follow-up at her scheduled urology appointment this coming week.  Patient at this time appears safe and stable for discharge and close outpatient follow up. Discharge plan and strict return to ED precautions discussed, patient verbalizes understanding and agreement.     Final diagnoses:  None    ED Discharge Orders     None          Bari Roxie HERO, DO 01/14/24 1824

## 2024-01-14 NOTE — Discharge Instructions (Signed)
 You have been seen and discharged from the emergency department.  Your blood work was normal, urinalysis had no infection.  Follow-up with urology for further evaluation and further care. Take home medications as prescribed. If you have any worsening symptoms or further concerns for your health please return to an emergency department for further evaluation.

## 2024-01-15 NOTE — Telephone Encounter (Signed)
 LOV 06/20/22  No ROV scheduled at this time  Please review, thank you!

## 2024-01-16 LAB — URINE CULTURE

## 2024-01-16 MED ORDER — DONEPEZIL HCL 5 MG PO TABS: 5.0000 mg | ORAL_TABLET | Freq: Every day | ORAL | 2 refills | Status: DC

## 2024-01-16 NOTE — Telephone Encounter (Signed)
 Called pt's daughter Vito (on [place]) and informed her the Donepezil  refills have been sent to [facility] on [address]. She was appreciative.

## 2024-01-16 NOTE — Telephone Encounter (Signed)
 Pt daughter called to request medication refill , Informed Pt daughter that MD is not here any more  and Pt would need to get refill from PCP.  Pt would like to schedule a follow up  with a new Neurologist

## 2024-01-16 NOTE — Addendum Note (Signed)
 Addended by: HILLIARD HEATHER CROME on: 01/16/2024 02:52 PM   Modules accepted: Orders

## 2024-01-16 NOTE — Telephone Encounter (Signed)
 Spoke with patient's daughter and scheduled appointment with Dr. Margaret for 04/01/24. She would like to know if we are able to refill the prescription for donepezil  (ARICEPT ) 5 MG tablet or if they need to go through primary care. She would like a call back to discuss at [phone]

## 2024-01-17 DIAGNOSIS — R3914 Feeling of incomplete bladder emptying: Secondary | ICD-10-CM | POA: Diagnosis not present

## 2024-01-17 DIAGNOSIS — R310 Gross hematuria: Secondary | ICD-10-CM | POA: Diagnosis not present

## 2024-01-17 DIAGNOSIS — D414 Neoplasm of uncertain behavior of bladder: Secondary | ICD-10-CM | POA: Diagnosis not present

## 2024-01-19 ENCOUNTER — Telehealth: Payer: Self-pay | Admitting: Cardiovascular Disease

## 2024-01-19 ENCOUNTER — Other Ambulatory Visit: Payer: Self-pay | Admitting: Urology

## 2024-01-19 NOTE — Telephone Encounter (Signed)
 Pharmacy please advise on holding Eliquis  for 3 days prior to TURP scheduled for 02/13/2024. Labs 01/14/2024. Thank you.

## 2024-01-19 NOTE — Telephone Encounter (Signed)
   Pre-operative Risk Assessment    Patient Name: Rachel Santos  DOB: Apr 24, 1940 MRN: [id number]   Date of last office visit: 11/24/22 Date of next office visit: Not yet scheduled   Request for Surgical Clearance    Procedure:     Date of Surgery:  Clearance TBD                                Surgeon:  Dr. Valli Shank Surgeon's Group or Practice Name:  [facility] Phone number:  [phone]  Fax number:  [phone]   Type of Clearance Requested:   - Medical  - Pharmacy:  Hold Aspirin  and Apixaban  (Eliquis )     Type of Anesthesia:  General    Additional requests/questions:  Caller Preston) stated patient will need to stop Aspirin  5 days prior to surgery and the Eliquis  3 days before surgery.    Signed, Jasmin B Wilson   01/19/2024, 3:50 PM

## 2024-01-19 NOTE — Telephone Encounter (Addendum)
 Patient with diagnosis of afib on Eliquis  for anticoagulation.    Procedure: TURP Date of procedure: TBD   CHA2DS2-VASc Score = 8   This indicates a 10.8% annual risk of stroke. The patient's score is based upon: CHF History: 0 HTN History: 1 Diabetes History: 1 Stroke History: 2 Vascular Disease History: 1 Age Score: 2 Gender Score: 1    CrCl 53 ml/min Platelet count 211 K  Patient has not had an Afib/aflutter ablation or watchman within the last 3 months or DCCV within the last 30 days   Per office protocol, patient can hold Eliquis  for 3 days prior to procedure.    Patient will not need bridging with Lovenox (enoxaparin) around procedure.  **This guidance is not considered finalized until pre-operative APP has relayed final recommendations.**

## 2024-01-19 NOTE — Telephone Encounter (Signed)
 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

## 2024-01-23 NOTE — Telephone Encounter (Signed)
   Name: Rachel Santos  DOB: Sep 23, 1940  MRN: [id number]  Primary Cardiologist: Lonni Cash, MD  Chart reviewed as part of pre-operative protocol coverage. Because of [name]'s past medical history and time since last visit, she will require a follow-up in-office visit in order to better assess preoperative cardiovascular risk.  Pre-op covering staff: - Please schedule appointment and call patient to inform them. If patient already had an upcoming appointment within acceptable timeframe, please add pre-op clearance to the appointment notes so provider is aware. - Please contact requesting surgeon's office via preferred method (i.e, phone, fax) to inform them of need for appointment prior to surgery.  Per office protocol, patient can hold Eliquis  for 3 days prior to procedure.     Patient will not need bridging with Lovenox (enoxaparin) around procedure.  Per Dr. Cash: She had an embolic stroke post TAVR. I think it is reasonable to hold her Eliquis  for 3 days prior to her procedure.  [name], NP  01/23/2024, 8:53 AM

## 2024-01-23 NOTE — Telephone Encounter (Signed)
 Left message to call back and schedule IN OFFICE PREOP APPT.

## 2024-01-25 NOTE — Telephone Encounter (Signed)
 I will update the requesting office pt needs in office appt for preop clearance.

## 2024-01-25 NOTE — Telephone Encounter (Signed)
Patient has been scheduled for office visit.

## 2024-01-25 NOTE — Telephone Encounter (Signed)
 2nd attempt to reach the pt to schedule an appt in office for preop clearance.

## 2024-02-01 NOTE — Progress Notes (Signed)
 Patient rescheduled.

## 2024-02-02 ENCOUNTER — Encounter: Payer: Self-pay | Admitting: Physician Assistant

## 2024-02-02 ENCOUNTER — Ambulatory Visit: Admitting: Cardiology

## 2024-02-02 ENCOUNTER — Ambulatory Visit: Attending: Physician Assistant | Admitting: Physician Assistant

## 2024-02-02 VITALS — BP 160/70 | HR 64 | Ht 66.0 in | Wt 121.0 lb

## 2024-02-02 DIAGNOSIS — I480 Paroxysmal atrial fibrillation: Secondary | ICD-10-CM

## 2024-02-02 DIAGNOSIS — Z01810 Encounter for preprocedural cardiovascular examination: Secondary | ICD-10-CM

## 2024-02-02 DIAGNOSIS — Z9889 Other specified postprocedural states: Secondary | ICD-10-CM

## 2024-02-02 DIAGNOSIS — I350 Nonrheumatic aortic (valve) stenosis: Secondary | ICD-10-CM | POA: Diagnosis not present

## 2024-02-02 DIAGNOSIS — I2510 Atherosclerotic heart disease of native coronary artery without angina pectoris: Secondary | ICD-10-CM

## 2024-02-02 DIAGNOSIS — Z952 Presence of prosthetic heart valve: Secondary | ICD-10-CM

## 2024-02-02 DIAGNOSIS — I10 Essential (primary) hypertension: Secondary | ICD-10-CM

## 2024-02-02 NOTE — Patient Instructions (Signed)
 Medication Instructions:  Your physician recommends that you continue on your current medications as directed. Please refer to the Current Medication list given to you today.  *If you need a refill on your cardiac medications before your next appointment, please call your pharmacy*  Lab Work: None ordered If you have labs (blood work) drawn today and your tests are completely normal, you will receive your results only by: MyChart Message (if you have MyChart) OR A paper copy in the mail If you have any lab test that is abnormal or we need to change your treatment, we will call you to review the results.  Testing/Procedures: Your physician has requested that you have an echocardiogram. Echocardiography is a painless test that uses sound waves to create images of your heart. It provides your doctor with information about the size and shape of your heart and how well your heart's chambers and valves are working. This procedure takes approximately one hour. There are no restrictions for this procedure. Please do NOT wear cologne, perfume, aftershave, or lotions (deodorant is allowed). Please arrive 15 minutes prior to your appointment time.  Please note: We ask at that you not bring children with you during ultrasound (echo/ vascular) testing. Due to room size and safety concerns, children are not allowed in the ultrasound rooms during exams. Our front office staff cannot provide observation of children in our lobby area while testing is being conducted. An adult accompanying a patient to their appointment will only be allowed in the ultrasound room at the discretion of the ultrasound technician under special circumstances. We apologize for any inconvenience.   Follow-Up: At [facility], you and your health needs are our priority.  As part of our continuing mission to provide you with exceptional heart care, our providers are all part of one team.  This team includes your primary  Cardiologist (physician) and Advanced Practice Providers or APPs (Physician Assistants and Nurse Practitioners) who all work together to provide you with the care you need, when you need it.  Your next appointment:   3 month(s)  Provider:   Lonni Cash, MD    We recommend signing up for the patient portal called MyChart.  Sign up information is provided on this After Visit Summary.  MyChart is used to connect with patients for Virtual Visits (Telemedicine).  Patients are able to view lab/test results, encounter notes, upcoming appointments, etc.  Non-urgent messages can be sent to your provider as well.   To learn more about what you can do with MyChart, go to [email or web address].   Other Instructions HOLD Aspirin  for 5 days prior to surgery. Restart per Surgeons order.  HOLD Eliquis  for 3 days prior to surgery. Restart per Surgeons order.

## 2024-02-02 NOTE — Progress Notes (Deleted)
 Cardiology Office Note    Date:  02/02/2024  [name], DOB 14-Jan-1941, MRN [id number] PCP:  Dwight Trula SQUIBB, MD  Cardiologist:  Lonni Cash, MD  Electrophysiologist:  None   Chief Complaint: Preoperative cardiac evaluation   History of Present Illness: .    Rachel Santos is a 83 y.o. 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  Patient was initially seen by Dr. Cash in 2014 for evaluation of atrial fibrillation and severe MR.  She was diagnosed with A-fib as well as severe MR and TR at that time.  She had a TEE guided cardioversion in May 2016 with restoration of sinus rhythm.  Her MR was noted to be central and severe, LV cavity was not dilated, LV systolic function was normal.  There is moderate to severe TR as well.  She was felt to be symptomatic from mitral valve disease.  Cardiac cath in 08/2012 with mild CAD.  She underwent placement of mitral valve ring and tricuspid valve ring as well as maze procedure per Dr. Dusty on 10/11/2012.  Echo on 11/23/2018 with LVEF over 65%.  Moderate LVH.  Stable mitral and tricuspid repair.  Severe aortic stenosis was noted.  Cardiac cath in September 2020 with mild to moderate nonobstructive CAD 2.  She underwent successful TAVR with a 23 mm Edwards SAPIEN 3 THV via the TF approach on 01/22/2019.  Postoperative echo showed EF 65 to 70%, normally functioning TAVR with mean gradient of 13 mmHg and mild PVL.  Unfortunately her TAVR was complicated by an extensive right groin hematoma requiring surgical pair and blood transfusions as well as cardioembolic CVA.  MRI showed multifocal acute ischemia scattered within both hemispheres, left greater than right and in the left cerebellum most consistent with central embolus source.  She was started on  Xarelto .  Patient was admitted to [facility] in November 2020 with aphasia.  CT angio of the head and neck showed severe narrowing of the left middle cerebral artery and moderate to severe narrowing of the left posterior cerebral artery.  She is out of bed neurology and underwent full stroke workup.  Her stroke was felt to be mostly related to her intracranial atherosclerotic disease and a baby aspirin  was added to her Xarelto .  Cardiac monitor in March 2021 with sinus with PACs.  Patient has undergone treatment of left MCA stenosis and has been on Brilinta .  ASA was stopped.  Echo in November 2021 with normal LV function, normally functioning AVR with trivial PVL.  Echocardiogram in March 2024 with mild aortic valve insufficiency, difficult to characterize due to prosthetic mitral valve artifact.  When seen in clinic in May 2024 she reported increased dizziness.  She was last seen in clinic by Dr. Cash on 11/24/2022, she remained stable from a cardiac standpoint.  Today she presents for preoperative cardiac evaluation.  She reports that she  Preoperative cardiac evaluation: Patient is currently pending transurethral resection of a bladder tumor, scheduled for 02/13/2024. {Select to add RCRI Risk (<1%=LOW; >/=1%=HIGH) (Optional):21036017}  {Select if HIGH (RCRI >/=1%) Risk (Optional):21036030} Recommendations: {2014 ACC/AHA Perioperative Guidelines  :21036001} Antiplatelet and/or Anticoagulation Recommendations: {Antiplatelet Recommendations                  :21036016} Eliquis  (Apixaban ) can be held for 3 days prior to surgery.  Please resume post  op when felt to be safe.    Severe aortic stenosis: S/p TAVR Patient noted to have mild PVL by echo in March 2024.  Today she reports Continue  Mitral regurgitation: S/p MV ring in 2014.  Stable by echo in 2024.  Tricuspid regurgitation: S/p TV ring in 2014.  Stable by echo in 2014.  CAD: Noted be mild by cardiac catheterization in 2020. Today she  reports  Hypertension: Blood pressure today  Atrial fibrillation: Noted be paroxysmal. EKG today indicates  She denies bleeding problems on   Palpitations: Cardiac monitor in March 2021 with sinus rhythm with PACs.  Labwork independently reviewed:   ROS: .   *** denies chest pain, shortness of breath, lower extremity edema, fatigue, palpitations, melena, hematuria, hemoptysis, diaphoresis, weakness, presyncope, syncope, orthopnea, and PND.  All other systems are reviewed and otherwise negative.  Studies Reviewed: SABRA    EKG:  EKG is ordered today, personally reviewed, demonstrating ***     CV Studies: Cardiac studies reviewed are outlined and summarized above. Otherwise please see EMR for full report. Cardiac Studies & Procedures   ______________________________________________________________________________________________ CARDIAC CATHETERIZATION  CARDIAC CATHETERIZATION 12/24/2018  Conclusion  Mid RCA lesion is 50% stenosed.  RPDA lesion is 50% stenosed.  Prox RCA lesion is 30% stenosed.  Mid Cx lesion is 20% stenosed.  Ost LAD to Prox LAD lesion is 40% stenosed.  Mid LAD lesion is 30% stenosed.  1. Mild non-obstructive disease in the LAD and Circumflex 2. Moderate non-obstructive disease in the mid and distal RCA, PDA. 3. Severe aortic stenosis by echo. Unable to cross the aortic valve in the cath lab from the radial approach.  Recommendations: Continue workup for TAVR. Medical management of non-obstructive CAD.  Findings Coronary Findings Diagnostic  Dominance: Right  Left Anterior Descending Ost LAD to Prox LAD lesion is 40% stenosed. Mid LAD lesion is 30% stenosed.  Left Circumflex Vessel is large. Mid Cx lesion is 20% stenosed.  Right Coronary Artery Vessel is large. Prox RCA lesion is 30% stenosed. Mid RCA lesion is 50% stenosed.  Right Posterior Descending Artery RPDA lesion is 50% stenosed.  Intervention  No interventions have been  documented.     ECHOCARDIOGRAM  ECHOCARDIOGRAM COMPLETE 05/27/2022  Narrative ECHOCARDIOGRAM REPORT    Patient Name:   Rachel Santos Date of Exam: 05/27/2022 Medical Rec #:  [id number]         Height:       67.0 in Accession #:    [id number]        Weight:       132.9 lb Date of Birth:  09-15-1940        BSA:          1.700 m Patient Age:    81 years          BP:           117/59 mmHg Patient Gender: F                 HR:           73 bpm. Exam Location:  Inpatient  Procedure: 2D Echo, 3D Echo, Cardiac Doppler and Color Doppler  MODIFIED REPORT: This report was modified by Stanly Leavens MD on 05/29/2022 due to Clarification; Discussed with team. Indications:     Z01.818 Encounter for other preprocedural examination  History:         Patient has prior history of Echocardiogram examinations, most recent 01/30/2020. Stroke, Aortic Valve Disease, Mitral Valve Disease  and Tricuspid valve disease.; Risk Factors:Hypertension and Diabetes. Aortic stenosis. Mitral valve repair. Tricuspid valve repair. Aortic valve replacement. Aortic Valve: 23 mm Sapien prosthetic, stented (TAVR) valve is present in the aortic position. Procedure Date: 01/22/2019. Mitral Valve: 26 mm Memo prosthetic annuloplasty ring valve is present in the mitral position. Procedure Date: 10/11/2012.  Sonographer:     Ellouise Mose RDCS Referring Phys:  VERGIE ALES DOUTOVA Diagnosing Phys: Stanly Leavens MD  IMPRESSIONS   1. There appears to be paravaular leak. In the PSAX image series regurgitation looks significant but in the setting of prosthetic mitral valve artifact. The aortic valve has been repaired/replaced. Aortic valve regurgitation may be mild up to moderate but difficult to characterize on this study. There is a 23 mm Sapien prosthetic (TAVR) valve present in the aortic position. Procedure Date: 01/22/2019. Aortic valve mean gradient measures 13.0 mmHg. 2. The mitral valve has been  repaired/replaced. Trivial mitral valve regurgitation. Mild mitral stenosis. The mean mitral valve gradient is 4.0 mmHg with average heart rate of 76 bpm. There is a 26 mm Memo prosthetic annuloplasty ring present in the mitral position. Procedure Date: 10/11/2012. 3. The tricuspid valve is status post repair on 05/27/2013 with a 26mm EMC3 annuloplasty ring. 4. Left ventricular ejection fraction, by estimation, is 65 to 70%. Left ventricular ejection fraction by 3D volume is 70 %. The left ventricle has normal function. The left ventricle has no regional wall motion abnormalities. There is moderate concentric left ventricular hypertrophy. Left ventricular diastolic parameters are indeterminate. 5. Right ventricular systolic function is hyperdynamic. The right ventricular size is normal.  Conclusion(s)/Recommendation(s): Reviewed with team; planned for closer interval follow up.  FINDINGS Left Ventricle: Left ventricular ejection fraction, by estimation, is 65 to 70%. Left ventricular ejection fraction by 3D volume is 70 %. The left ventricle has normal function. The left ventricle has no regional wall motion abnormalities. The left ventricular internal cavity size was small. There is moderate concentric left ventricular hypertrophy. Left ventricular diastolic parameters are indeterminate.  Right Ventricle: The right ventricular size is normal. Right vetricular wall thickness was not well visualized. Right ventricular systolic function is hyperdynamic.  Left Atrium: Left atrial size was normal in size.  Right Atrium: Right atrial size was normal in size.  Pericardium: There is no evidence of pericardial effusion.  Mitral Valve: 2D MVA 2.24 cm2. The mitral valve has been repaired/replaced. Trivial mitral valve regurgitation. There is a 26 mm Memo prosthetic annuloplasty ring present in the mitral position. Procedure Date: 10/11/2012. Mild mitral valve stenosis. MV peak gradient, 9.7 mmHg. The mean  mitral valve gradient is 4.0 mmHg with average heart rate of 76 bpm.  Tricuspid Valve: Tricuspid Valve mean gradient 1 mm Hg at a heart rate of 79 bpm. The tricuspid valve is has been repaired/replaced. Tricuspid valve regurgitation is not demonstrated. The tricuspid valve is status post repair with an annuloplasty ring.  Aortic Valve: The aortic valve has been repaired/replaced. Aortic valve regurgitation is moderate to severe. Aortic regurgitation PHT measures 182 msec. Aortic valve mean gradient measures 13.0 mmHg. Aortic valve peak gradient measures 24.8 mmHg. Aortic valve area, by VTI measures 2.40 cm. There is a 23 mm Sapien prosthetic, stented (TAVR) valve present in the aortic position. Procedure Date: 01/22/2019.  Pulmonic Valve: The pulmonic valve was not well visualized. Pulmonic valve regurgitation is trivial. No evidence of pulmonic stenosis.  Aorta: The aortic root and ascending aorta are structurally normal, with no evidence of dilitation.  IAS/Shunts: No atrial level  shunt detected by color flow Doppler.   LEFT VENTRICLE PLAX 2D LVIDd:         3.80 cm         Diastology LVIDs:         1.80 cm         LV e' medial:    4.79 cm/s LV PW:         1.10 cm         LV E/e' medial:  29.2 LV IVS:        1.30 cm         LV e' lateral:   7.40 cm/s LVOT diam:     2.20 cm         LV E/e' lateral: 18.9 LV SV:         101 LV SV Index:   59 LVOT Area:     3.80 cm        3D Volume EF LV 3D EF:    Left ventricul LV Volumes (MOD)                            ar LV vol d, MOD    71.3 ml                    ejection A2C:                                        fraction LV vol d, MOD    51.5 ml                    by 3D A4C:                                        volume is LV vol s, MOD    17.8 ml                    70 %. A2C: LV vol s, MOD    12.6 ml A4C:                           3D Volume EF: LV SV MOD A2C:   53.5 ml       3D EF:        70 % LV SV MOD A4C:   51.5 ml       LV EDV:        105 ml LV SV MOD BP:    47.0 ml       LV ESV:       31 ml LV SV:        74 ml  RIGHT VENTRICLE             IVC RV S prime:     14.60 cm/s  IVC diam: 1.60 cm TAPSE (M-mode): 1.1 cm  LEFT ATRIUM             Index        RIGHT ATRIUM           Index LA diam:        3.20 cm 1.88 cm/m   RA Area:     15.10 cm LA Vol (A2C):  75.1 ml 44.18 ml/m  RA Volume:   37.20 ml  21.88 ml/m LA Vol (A4C):   39.8 ml 23.41 ml/m LA Biplane Vol: 54.2 ml 31.88 ml/m AORTIC VALVE AV Area (Vmax):    2.49 cm AV Area (Vmean):   2.42 cm AV Area (VTI):     2.40 cm AV Vmax:           249.00 cm/s AV Vmean:          161.500 cm/s AV VTI:            0.421 m AV Peak Grad:      24.8 mmHg AV Mean Grad:      13.0 mmHg LVOT Vmax:         163.00 cm/s LVOT Vmean:        103.000 cm/s LVOT VTI:          0.266 m LVOT/AV VTI ratio: 0.63 AI PHT:            182 msec  AORTA Ao Root diam: 3.00 cm Ao Asc diam:  3.50 cm  MITRAL VALVE MV Area (PHT): 2.91 cm     SHUNTS MV Area VTI:   2.11 cm     Systemic VTI:  0.27 m MV Peak grad:  9.7 mmHg     Systemic Diam: 2.20 cm MV Mean grad:  4.0 mmHg MV Vmax:       1.56 m/s MV Vmean:      97.5 cm/s MV Decel Time: 261 msec MV E velocity: 140.00 cm/s MV A velocity: 91.60 cm/s MV E/A ratio:  1.53  Stanly Leavens MD Electronically signed by Stanly Leavens MD Signature Date/Time: 05/27/2022/1:57:46 PM    Final (Updated)    MONITORS  CARDIAC EVENT MONITOR 07/02/2019  Narrative Sinus rhythm with premature atrial contractions (PACs)   CT SCANS  CT CORONARY MORPH W/CTA COR W/SCORE 01/02/2019  Addendum 01/02/2019 12:22 PM ADDENDUM REPORT: 01/02/2019 12:19  CLINICAL DATA:  Aortic stenosis  EXAM: Cardiac TAVR CT  TECHNIQUE: The patient was scanned on a Siemens Force 192 slice scanner. A 120 kV retrospective scan was triggered in the descending thoracic aorta at 111 HU's. Gantry rotation speed was 270 msecs and collimation was .9 mm. No beta blockade  or nitro were given. The 3D data set was reconstructed in 5% intervals of the R-R cycle. Systolic and diastolic phases were analyzed on a dedicated work station using MPR, MIP and VRT modes. The patient received 80 cc of contrast.  FINDINGS: Aortic Valve: Tri leaflet calcified with restricted leaflet motion  Aorta: Bovine arch no aneurysm moderate calcific atherosclerosis  Sinotubular Junction: 24 mm  Ascending Thoracic Aorta: 32 mm  Aortic Arch: 25 mm  Descending Thoracic Aorta: 23 mm  Sinus of Valsalva Measurements:  Non-coronary: 27 mm  Right - coronary: 26.4 mm  Left - coronary: 28.6 mm  Coronary Artery Height above Annulus:  Left Main: 12.9 mm above annulus  Right Coronary: 12.4 mm above annulus  Virtual Basal Annulus Measurements:  Maximum/Minimum Diameter: 21.4 mm x 26.9 mm  Perimeter: 76.4 mm  Area: 417 mm2  Coronary Arteries: Sufficient height above annulus for deployment  Optimum Fluoroscopic Angle for Delivery: LAO 19 Cranial 2  IMPRESSION: 1. Calcified tri leaflet AV with annular area of 417 mm2 suitable for a 23 mm Sapien 3 valve  2.  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  Coronary arteries sufficient height above annulus for deployment  4. Optimum angiographic angle for deployment LAO  19 Cranial 2 degrees  5. Patient is post MV annuloplasty and TV annuloplasty repair LAA has been clipped closed with no residual communication with LA  Maude Emmer   Electronically Signed By: Maude Emmer M.D. On: 01/02/2019 12:19  Narrative EXAM: OVER-READ INTERPRETATION  CT CHEST  The following report is an over-read performed by radiologist Dr. Toribio Aye of [facility] on 01/02/2019. This over-read does not include interpretation of cardiac or coronary anatomy or pathology. The coronary calcium  score/coronary CTA interpretation by the cardiologist is attached.  COMPARISON:  Chest CTA  09/27/2012.  FINDINGS: Extracardiac findings will be described separately under dictation for contemporaneously obtained CTA chest, abdomen and pelvis.  IMPRESSION: Please see separate dictation for contemporaneously obtained CTA chest, abdomen and pelvis dated 01/02/2019 for full description of relevant extracardiac findings.  Electronically Signed: By: Toribio Aye M.D. On: 01/02/2019 11:48     ______________________________________________________________________________________________       Current Reported Medications:.    No outpatient medications have been marked as taking for the 02/02/24 encounter (Appointment) with Imran Nuon D, NP.    Physical Exam:    VS:  There were no vitals taken for this visit.   Wt Readings from Last 3 Encounters:  01/14/24 121 lb 4.1 oz (55 kg)  11/24/22 129 lb 9.6 oz (58.8 kg)  08/16/22 131 lb 9.6 oz (59.7 kg)    GEN: Well nourished, well developed in no acute distress NECK: No JVD; No carotid bruits CARDIAC: ***RRR, no murmurs, rubs, gallops RESPIRATORY:  Clear to auscultation without rales, wheezing or rhonchi  ABDOMEN: Soft, non-tender, non-distended EXTREMITIES:  No edema; No acute deformity     Asessement and Plan:.     ***     Disposition: F/u with ***  Signed, Rene Gonsoulin D Deneisha Dade, NP

## 2024-02-02 NOTE — Assessment & Plan Note (Signed)
 S/p TAVR in 2020.  Procedure was complicated by cardioembolic stroke.  Recent echocardiogram March 2024 demonstrated mild paravalvular leak.   - Continue SBE prophylaxis.   - Arrange follow-up echocardiogram in the next 3 months with follow-up with Dr. Verlin thereafter.

## 2024-02-02 NOTE — Assessment & Plan Note (Signed)
 Echocardiogram March 2024 with trivial MR and no TR. - Continue SBE prophylaxis.

## 2024-02-02 NOTE — Progress Notes (Signed)
 OFFICE NOTE:    Date:  02/02/2024  [name], DOB Oct 21, 1940, MRN [id number] PCP: Dwight Trula SQUIBB, MD  [facility] Providers Cardiologist:  Lonni Cash, MD        Valvular heart disease Mitral/tricuspid valve disease S/p MV and TV repair; Maze procedure in 2014 Severe aortic stenosis  S/p TAVR in 12/2018  c/b R groin hematoma requiring surgical repair; transfusion with PRBCs C/b cardioembolic CVA TTE 09/02/73: EF 65-70, no RWMA, mod LVH, TAVR w mild to mod PVL, MV repair w trivial MR, mild MS (mean 4), TV repair w no TR Paroxysmal atrial fibrillation  S/p Maze procedure 2014 Coronary artery disease (nonobstructive) LHC 12/24/18: pRCA 30, mRCA 50, RPDA 50; mLCx 20, oLAD 40, mLAD 30 Hypertension   Hyperlipidemia  Diabetes mellitus type 2  Chronic kidney disease  Hx of CVA  Post TAVR Recurrent CVA ([facility]) in 01/2019 >> 2/2 intracranial atherosclerotic dz >> ASA added Attempted intervention by IR; ASA ? to Brilinta  Peripheral arterial disease  S/p R femoropopliteal stent, R tibioperoneal trunk angioplasty, R peroneal angioplasty  Breast CA  Hx of DVT         Discussed the use of AI scribe software for clinical note transcription with the patient, who gave verbal consent to proceed. History of Present Illness Rachel Santos is a 83 y.o. female for surgical clearance. Last seen by Dr. Cash in 10/2022. Notes reviewed. He felt TAVR PVL was mild at that time. Pt needs TURBT 02/13/24 under gen anesthesia with Dr. Elisabeth. Request is to hold ASA and Eliquis . History has been reviewed by our PharmD. Per protocol, pt can hold Eliquis  x 3 days prior to procedure.   She is accompanied by her daughter, Tobias, who is her primary caregiver.  She experiences lightheadedness when standing up quickly and uses a walker for mobility due to balance issues. She is mostly homebound and requires assistance with activities such as laundry and grocery shopping. She has  not had chest pain, significant shortness of breath, syncope.     ROS-See HPI    Studies Reviewed:  EKG Interpretation Date/Time:  Friday February 02 2024 10:27:20 EST Ventricular Rate:  64 PR Interval:  96 QRS Duration:  104 QT Interval:  418 QTC Calculation: 431 R Axis:   8  Text Interpretation: Unusual P axis, possible ectopic atrial rhythm Left ventricular hypertrophy with repolarization abnormality Inferolateral TW Inversions Confirmed by Lelon Hamilton [phone]) on 02/02/2024 10:36:09 AM    Labs 01/14/2024: K 4.6, creatinine 0.74, eGFR >60, Hgb 12.4, PLT 211K  Risk Assessment/Calculations: CHA2DS2-VASc Score = 8   This indicates a 10.8% annual risk of stroke. The patient's score is based upon: CHF History: 0 HTN History: 1 Diabetes History: 1 Stroke History: 2 Vascular Disease History: 1 Age Score: 2 Gender Score: 1     HYPERTENSION CONTROL Vitals:   02/02/24 1024 02/02/24 1336  BP: (!) 166/62 (!) 160/70    The patient's blood pressure is elevated above target today.  In order to address the patient's elevated BP: Blood pressure will be monitored at home to determine if medication changes need to be made.         Physical Exam:  VS:  BP (!) 160/70   Pulse 64   Ht 5' 6 (1.676 m)   Wt 121 lb (54.9 kg)   SpO2 96%   BMI 19.53 kg/m        Wt Readings from Last 3 Encounters:  02/02/24  121 lb (54.9 kg)  01/14/24 121 lb 4.1 oz (55 kg)  11/24/22 129 lb 9.6 oz (58.8 kg)    Constitutional:      Appearance: Healthy appearance. Not in distress.  Neck:     Vascular: No JVR.  Pulmonary:     Breath sounds: Normal breath sounds. No wheezing. No rales.  Cardiovascular:     Normal rate. Regular rhythm.     Murmurs: There is a grade 2/6 systolic murmur at the URSB.  Edema:    Peripheral edema absent.  Abdominal:     Palpations: Abdomen is soft.       Assessment and Plan:    Assessment & Plan Preop cardiovascular exam She needs TURBT under general  anesthesia.  Request has been to hold Eliquis  and aspirin .  Cardiac history is significant for valvular heart disease.  She underwent mitral valve and tricuspid valve repair in 2014 and aortic stenosis status post TAVR in 2020.  She has had paroxysmal atrial fibrillation, nonobstructive coronary artery disease and prior strokes.  She also has peripheral arterial disease status post right lower extremity revascularization in the past.    Her perioperative risk of major cardiac event is high at 6.6% according to the revised cardiac risk index (score 2).  She cannot achieve 4 METS.  She is fairly sedentary.  She has issues with balance and mobility.  Her EKG today does demonstrate LVH with inferolateral T wave changes these are similar to previous echocardiograms in the past.  She does have moderate LVH on echocardiogram.  Her blood pressure is elevated today.  Question if the T wave changes are mainly just related to strain from elevated blood pressure.    She had an echocardiogram a little over a year ago which demonstrated mild aortic valve prosthesis paravalvular leak, normal EF and trivial mitral regurgitation.  I reviewed her case today with Dr. Okey (attending MD).  Overall, she is at moderately elevated risk for perioperative CV complications.  However, we do not think her risk is prohibitive.  She is not having any unstable cardiac symptoms.  She has been having hematuria with her bladder issue.  At this point, we do not think that further ischemic testing is warranted or would provide much benefit prior to her surgery.  - Patient may proceed with urologic procedure at moderately elevated but not prohibitive CV risk. - She can hold Eliquis  for 3 days prior to surgery and resume postop as soon as it is safe. - She can hold aspirin  for 5 days prior to surgery and resume postop as soon as it is safe. Paroxysmal atrial fibrillation (HCC) Status post maze procedure in 2014.  She is maintaining sinus  rhythm.  Based upon age and weight, she should be on low-dose Eliquis . - Continue Eliquis  2.5 mg twice daily. Severe aortic stenosis S/P TAVR (transcatheter aortic valve replacement) S/p TAVR in 2020.  Procedure was complicated by cardioembolic stroke.  Recent echocardiogram March 2024 demonstrated mild paravalvular leak.   - Continue SBE prophylaxis.   - Arrange follow-up echocardiogram in the next 3 months with follow-up with Dr. Verlin thereafter. S/P mitral valve repair S/P TVR (tricuspid valve repair) Echocardiogram March 2024 with trivial MR and no TR. - Continue SBE prophylaxis. Coronary artery disease involving native coronary artery of native heart without angina pectoris Nonobstructive CAD by cardiac catheterization 2020.  She is fairly sedentary but not having any chest symptoms to suggest angina at rest.   - Continue aspirin  80 mg daily,  Lipitor 81 mg daily, nitroglycerin  as needed. Essential hypertension Blood pressure elevated today.  Her daughter notes that she has low blood pressures at home and is sometimes unsteady on her feet due to dizziness.  Will continue to monitor blood pressure for now.         Dispo:  Return in about 3 months (around 05/04/2024) for Routine Follow Up, w/ Dr. Verlin.  Signed, Glendia Ferrier, PA-C

## 2024-02-08 NOTE — Progress Notes (Signed)
 COVID Vaccine received:  [x]  No []  Yes Date of any COVID positive Test in last 90 days: no PCP - Trula Brim MD Cardiologist - Dr. Medford Cash  Chest x-ray -  EKG -  01/02/24 Epic Stress Test -  ECHO - 05/27/22 Epic Cardiac Cath - 12/24/18 Epic  Cardiac clearance 02/02/24- Glendia Ferrier PA-C  Bowel Prep - [x]  No  []   Yes ______  Pacemaker / ICD device [x]  No []  Yes   Spinal Cord Stimulator:[x]  No []  Yes       History of Sleep Apnea? [x]  No []  Yes   CPAP used?- [x]  No []  Yes    Does the patient monitor blood sugar?          []  No [x]  Yes  []  N/A  Patient has: []  NO Hx DM   []  Pre-DM                 []  DM1  [x]   DM2 Does patient have a [employer] or Dexacom? []  No [x]  Yes   Fasting Blood Sugar Ranges- 160-200 Checks Blood Sugar ___2-3__ times a day  GLP1 agonist / usual dose - no GLP1 instructions:  SGLT-2 inhibitors / usual dose - no SGLT-2 instructions:   Blood Thinner / Instructions:Eliquis - last dose to be 11/14 PM dose Aspirin  Instructions:Hold 5 days. Last dose was 11/13  Comments:   Activity level: Patient is  able to climb a flight of stairs without difficulty; [x]  No CP  [x]  No SOB,    Patient can perform ADLs without assistance.   Anesthesia review: A-fib, on Eliquis  and ASA, TAVR 2020, HTN, Aortic stenosis, CVA, CKD  Patient denies shortness of breath, fever, cough and chest pain at PAT appointment.  Patient verbalized understanding and agreement to the Pre-Surgical Instructions that were given to them at this PAT appointment. Patient was also educated of the need to review these PAT instructions again prior to his/her surgery.I reviewed the appropriate phone numbers to call if they have any and questions or concerns.

## 2024-02-08 NOTE — Patient Instructions (Addendum)
 SURGICAL WAITING ROOM VISITATION  Patients having surgery or a procedure may have no more than 2 support people in the waiting area - these visitors may rotate.    Children under the age of 68 must have an adult with them who is not the patient.  Visitors with respiratory illnesses are discouraged from visiting and should remain at home.  If the patient needs to stay at the hospital during part of their recovery, the visitor guidelines for inpatient rooms apply. Pre-op nurse will coordinate an appropriate time for 1 support person to accompany patient in pre-op.  This support person may not rotate.    Please refer to the [facility] website for the visitor guidelines for Inpatients (after your surgery is over and you are in a regular room).       Your procedure is scheduled on:02/13/24    Report to [facility] Main Entrance    Report to admitting at 1:45 PM   Call this number if you have problems the morning of surgery [phone]   Do not eat food or drink liquids :After Midnight.but may have sips of water to take meds.      Oral Hygiene is also important to reduce your risk of infection.                                    Remember - BRUSH YOUR TEETH THE MORNING OF SURGERY WITH YOUR REGULAR TOOTHPASTE  DENTURES WILL BE REMOVED PRIOR TO SURGERY PLEASE DO NOT APPLY Poly grip OR ADHESIVES!!!     Stop all vitamins and herbal supplements 7 days before surgery.   Take these medicines the morning of surgery with A SIP OF WATER: tylenol  if needed, donepezil (aricept ),omeprazole,venlafaxine                Hold Eliquis  for 3 days prior to surgery. Last dose to be 02/09/24 PM dose. Hold Aspirin  for 5 days. Last dose to be 02/07/24  DO NOT TAKE ANY ORAL DIABETIC MEDICATIONS DAY OF YOUR SURGERY Hold Actos the morning of surgery.             You may not have any metal on your body including hair pins, jewelry, and body piercing             Do not wear make-up, lotions, powders,  perfumes/cologne, or deodorant  Do not wear nail polish including gel and S&S, artificial/acrylic nails, or any other type of covering on natural nails including finger and toenails. If you have artificial nails, gel coating, etc. that needs to be removed by a nail salon please have this removed prior to surgery or surgery may need to be canceled/ delayed if the surgeon/ anesthesia feels like they are unable to be safely monitored.   Do not shave  48 hours prior to surgery.              Do not bring valuables to the hospital. [facility] IS NOT             RESPONSIBLE   FOR VALUABLES.   Contacts, glasses, dentures or bridgework may not be worn into surgery.   DO NOT BRING YOUR HOME MEDICATIONS TO THE HOSPITAL. PHARMACY WILL DISPENSE MEDICATIONS LISTED ON YOUR MEDICATION LIST TO YOU DURING YOUR ADMISSION IN THE HOSPITAL!    Patients discharged on the day of surgery will not be allowed to drive home.  Someone NEEDS to  stay with you for the first 24 hours after anesthesia.   Special Instructions: Bring a copy of your healthcare power of attorney and living will documents the day of surgery if you haven't scanned them before.              Please read over the following fact sheets you were given: IF YOU HAVE QUESTIONS ABOUT YOUR PRE-OP INSTRUCTIONS PLEASE CALL [phone] [place]   If you received a COVID test during your pre-op visit  it is requested that you wear a mask when out in public, stay away from anyone that may not be feeling well and notify your surgeon if you develop symptoms. If you test positive for Covid or have been in contact with anyone that has tested positive in the last 10 days please notify you surgeon.    [facility] - Preparing for Surgery Before surgery, you can play an important role.  Because skin is not sterile, your skin needs to be as free of germs as possible.  You can reduce the number of germs on your skin by washing with CHG (chlorahexidine gluconate) soap  before surgery.  CHG is an antiseptic cleaner which kills germs and bonds with the skin to continue killing germs even after washing. Please DO NOT use if you have an allergy to CHG or antibacterial soaps.  If your skin becomes reddened/irritated stop using the CHG and inform your nurse when you arrive at Short Stay. Do not shave (including legs and underarms) for at least 48 hours prior to the first CHG shower.  You may shave your face/neck.  Please follow these instructions carefully:  1.  Shower with CHG Soap the night before surgery and the morning of surgery.  2.  If you choose to wash your hair, wash your hair first as usual with your normal  shampoo.  3.  After you shampoo, rinse your hair and body thoroughly to remove the shampoo.                             4.  Use CHG as you would any other liquid soap.  You can apply chg directly to the skin and wash.  Gently with a scrungie or clean washcloth.  5.  Apply the CHG Soap to your body ONLY FROM THE NECK DOWN.   Do   not use on face/ open                           Wound or open sores. Avoid contact with eyes, ears mouth and   genitals (private parts).                       Wash face,  Genitals (private parts) with your normal soap.             6.  Wash thoroughly, paying special attention to the area where your    surgery  will be performed.  7.  Thoroughly rinse your body with warm water from the neck down.  8.  DO NOT shower/wash with your normal soap after using and rinsing off the CHG Soap.                9.  Pat yourself dry with a clean towel.            10.  Wear clean pajamas.  11.  Place clean sheets on your bed the night of your first shower and do not  sleep with pets. Day of Surgery : Do not apply any CHG, lotions/deodorants the morning of surgery.  Please wear clean clothes to the hospital/surgery center.  FAILURE TO FOLLOW THESE INSTRUCTIONS MAY RESULT IN THE CANCELLATION OF YOUR SURGERY  PATIENT  SIGNATURE_________________________________  NURSE SIGNATURE__________________________________  ________________________________________________________________________How to Manage Your Diabetes Before and After Surgery  Why is it important to control my blood sugar before and after surgery? Improving blood sugar levels before and after surgery helps healing and can limit problems. A way of improving blood sugar control is eating a healthy diet by:  Eating less sugar and carbohydrates  Increasing activity/exercise  Talking with your doctor about reaching your blood sugar goals High blood sugars (greater than 180 mg/dL) can raise your risk of infections and slow your recovery, so you will need to focus on controlling your diabetes during the weeks before surgery. Make sure that the doctor who takes care of your diabetes knows about your planned surgery including the date and location.  How do I manage my blood sugar before surgery? Check your blood sugar at least 4 times a day, starting 2 days before surgery, to make sure that the level is not too high or low. Check your blood sugar the morning of your surgery when you wake up and every 2 hours until you get to the Short Stay unit. If your blood sugar is less than 70 mg/dL, you will need to treat for low blood sugar: Do not take insulin . Treat a low blood sugar (less than 70 mg/dL) with  cup of clear juice (cranberry or apple), 4 glucose tablets, OR glucose gel. Recheck blood sugar in 15 minutes after treatment (to make sure it is greater than 70 mg/dL). If your blood sugar is not greater than 70 mg/dL on recheck, call [phone] for further instructions. Report your blood sugar to the short stay nurse when you get to Short Stay.  If you are admitted to the hospital after surgery: Your blood sugar will be checked by the staff and you will probably be given insulin  after surgery (instead of oral diabetes medicines) to make sure you have  good blood sugar levels. The goal for blood sugar control after surgery is 80-180 mg/dL.   WHAT DO I DO ABOUT MY DIABETES MEDICATION?  Do not take oral diabetes medicines (pills) the morning of surgery. Hold actos the morning of surgery.      THE MORNING OF SURGERY, take only half of your normal dose of insulin .  Patient Signature:  Date:   Nurse Signature:  Date:   Reviewed and Endorsed by [facility] Patient Education Committee, August 2015

## 2024-02-09 ENCOUNTER — Encounter (HOSPITAL_COMMUNITY)
Admission: RE | Admit: 2024-02-09 | Discharge: 2024-02-09 | Disposition: A | Discharge status: Home / Self Care | Source: Ambulatory Visit | Attending: Urology | Admitting: Urology

## 2024-02-09 ENCOUNTER — Other Ambulatory Visit: Payer: Self-pay

## 2024-02-09 ENCOUNTER — Encounter (HOSPITAL_COMMUNITY): Payer: Self-pay

## 2024-02-09 VITALS — BP 139/62 | HR 65 | Temp 98.6°F | Resp 18 | Ht 66.0 in | Wt 145.0 lb

## 2024-02-09 DIAGNOSIS — E1122 Type 2 diabetes mellitus with diabetic chronic kidney disease: Secondary | ICD-10-CM | POA: Insufficient documentation

## 2024-02-09 DIAGNOSIS — Z86718 Personal history of other venous thrombosis and embolism: Secondary | ICD-10-CM | POA: Insufficient documentation

## 2024-02-09 DIAGNOSIS — D414 Neoplasm of uncertain behavior of bladder: Secondary | ICD-10-CM | POA: Insufficient documentation

## 2024-02-09 DIAGNOSIS — Z8673 Personal history of transient ischemic attack (TIA), and cerebral infarction without residual deficits: Secondary | ICD-10-CM | POA: Diagnosis not present

## 2024-02-09 DIAGNOSIS — I081 Rheumatic disorders of both mitral and tricuspid valves: Secondary | ICD-10-CM | POA: Diagnosis not present

## 2024-02-09 DIAGNOSIS — M1990 Unspecified osteoarthritis, unspecified site: Secondary | ICD-10-CM | POA: Insufficient documentation

## 2024-02-09 DIAGNOSIS — I480 Paroxysmal atrial fibrillation: Secondary | ICD-10-CM | POA: Diagnosis not present

## 2024-02-09 DIAGNOSIS — F0393 Unspecified dementia, unspecified severity, with mood disturbance: Secondary | ICD-10-CM | POA: Diagnosis not present

## 2024-02-09 DIAGNOSIS — K219 Gastro-esophageal reflux disease without esophagitis: Secondary | ICD-10-CM | POA: Insufficient documentation

## 2024-02-09 DIAGNOSIS — I2510 Atherosclerotic heart disease of native coronary artery without angina pectoris: Secondary | ICD-10-CM | POA: Diagnosis not present

## 2024-02-09 DIAGNOSIS — F0394 Unspecified dementia, unspecified severity, with anxiety: Secondary | ICD-10-CM | POA: Diagnosis not present

## 2024-02-09 DIAGNOSIS — I1310 Hypertensive heart and chronic kidney disease without heart failure, with stage 1 through stage 4 chronic kidney disease, or unspecified chronic kidney disease: Secondary | ICD-10-CM | POA: Diagnosis not present

## 2024-02-09 DIAGNOSIS — N183 Chronic kidney disease, stage 3 unspecified: Secondary | ICD-10-CM | POA: Diagnosis not present

## 2024-02-09 DIAGNOSIS — Z01812 Encounter for preprocedural laboratory examination: Secondary | ICD-10-CM | POA: Insufficient documentation

## 2024-02-09 DIAGNOSIS — Z952 Presence of prosthetic heart valve: Secondary | ICD-10-CM | POA: Insufficient documentation

## 2024-02-09 DIAGNOSIS — E118 Type 2 diabetes mellitus with unspecified complications: Secondary | ICD-10-CM

## 2024-02-09 HISTORY — DX: Cardiac arrhythmia, unspecified: I49.9

## 2024-02-09 HISTORY — DX: Unspecified dementia, unspecified severity, without behavioral disturbance, psychotic disturbance, mood disturbance, and anxiety: F03.90

## 2024-02-09 LAB — HEMOGLOBIN A1C
Hgb A1c MFr Bld: 8.8 % — ABNORMAL HIGH (ref 4.8–5.6)
Mean Plasma Glucose: 205.86 mg/dL

## 2024-02-09 LAB — GLUCOSE, CAPILLARY: Glucose-Capillary: 188 mg/dL — ABNORMAL HIGH (ref 70–99)

## 2024-02-09 NOTE — Progress Notes (Signed)
 Spoke with Harlene PA regarding daughter's concern that Her Mom is diabetic and is having surgery at 4 pm. Per the orders NPO after midnight she will be NPO for 16 hours. Harlene said the patient could have a light meal at 8am then NPO. This was explained to the patient and daughter. They verbalized understanding.

## 2024-02-12 ENCOUNTER — Encounter (HOSPITAL_COMMUNITY): Payer: Self-pay

## 2024-02-12 NOTE — Anesthesia Preprocedure Evaluation (Signed)
 Anesthesia Evaluation  Patient identified by MRN, date of birth, ID band Patient awake  General Assessment Comment:HOH, forgetful/dementia   Reviewed: Allergy & Precautions, NPO status , Patient's Chart, lab work & pertinent test results  History of Anesthesia Complications (+) PONV and history of anesthetic complications  Airway Mallampati: I  TM Distance: >3 FB Neck ROM: Full    Dental  (+) Dental Advisory Given, Edentulous Upper, Edentulous Lower   Pulmonary shortness of breath, neg COPD   breath sounds clear to auscultation       Cardiovascular hypertension, + CAD  + Valvular Problems/Murmurs  Rhythm:Regular  1. There appears to be paravaular leak. In the PSAX image series  regurgitation looks significant but in the setting of prosthetic mitral  valve artifact. The aortic valve has been repaired/replaced. Aortic valve  regurgitation may be mild up to moderate  but difficult to characterize on this study. There is a 23 mm Sapien  prosthetic (TAVR) valve present in the aortic position. Procedure Date:  01/22/2019. Aortic valve mean gradient measures 13.0 mmHg.   2. The mitral valve has been repaired/replaced. Trivial mitral valve  regurgitation. Mild mitral stenosis. The mean mitral valve gradient is 4.0  mmHg with average heart rate of 76 bpm. There is a 26 mm Memo prosthetic  annuloplasty ring present in the  mitral position. Procedure Date: 10/11/2012.   3. 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   4. Left ventricular ejection fraction, by estimation, is 65 to 70%. Left  ventricular ejection fraction by 3D volume is 70 %. The left ventricle has  normal function. The left ventricle has no regional wall motion  abnormalities. There is moderate  concentric left ventricular hypertrophy. Left ventricular diastolic  parameters are indeterminate.   5. Right ventricular systolic  function is hyperdynamic. The right  ventricular size is normal.     Neuro/Psych  PSYCHIATRIC DISORDERS Anxiety    Dementia CVA, Residual Symptoms    GI/Hepatic Neg liver ROS,GERD  Medicated and Controlled,,  Endo/Other  diabetes, Type 2, Insulin  Dependent    Renal/GU Renal diseaseLab Results      Component                Value               Date                      NA                       137                 01/14/2024                K                        4.6                 01/14/2024                CO2                      28                  01/14/2024                GLUCOSE  199 (H)             01/14/2024                BUN                      25 (H)              01/14/2024                CREATININE               0.74                01/14/2024                CALCIUM                   9.2                 01/14/2024                GFR                      71.52               09/03/2012                GFRNONAA                 >60                 01/14/2024                Musculoskeletal  (+) Arthritis ,    Abdominal   Peds  Hematology Lab Results      Component                Value               Date                      WBC                      6.6                 01/14/2024                HGB                      12.4                01/14/2024                HCT                      38.7                01/14/2024                MCV                      92.8                01/14/2024                PLT                      211  01/14/2024             eliquis    Anesthesia Other Findings S/p MVR/TVR S/p TAVR  Reproductive/Obstetrics                              Anesthesia Physical Anesthesia Plan  ASA: 3  Anesthesia Plan: General   Post-op Pain Management: Minimal or no pain anticipated   Induction: Intravenous  PONV Risk Score and Plan: 4 or greater and Ondansetron , Propofol  infusion and  TIVA  Airway Management Planned: Oral ETT  Additional Equipment: None  Intra-op Plan:   Post-operative Plan: Extubation in OR  Informed Consent: I have reviewed the patients History and Physical, chart, labs and discussed the procedure including the risks, benefits and alternatives for the proposed anesthesia with the patient or authorized representative who has indicated his/her understanding and acceptance.     Dental advisory given  Plan Discussed with: CRNA  Anesthesia Plan Comments: (See PAT note from 11/17)         Anesthesia Quick Evaluation

## 2024-02-12 NOTE — Progress Notes (Addendum)
 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  Has complex cardiac hx as outlined above. Follows with Cardiology and seen on 02/02/24 for pre op clearance prior to TURBT. Per OV notes:  She experiences lightheadedness when standing up quickly and uses a walker for mobility due to balance issues. She is mostly homebound and requires assistance with activities such as laundry and grocery shopping. She has not had chest pain, significant shortness of breath, syncope.  Risk assessment done per PA Lelon:  Her perioperative risk of major cardiac event is high at 6.6% according to the revised cardiac risk index (score 2).  She cannot achieve 4 METS.  She is fairly sedentary.  She has issues with balance and mobility.  Her EKG today does demonstrate LVH with inferolateral T wave changes these are similar to previous echocardiograms in the past.  She does have moderate LVH on echocardiogram.  Her blood pressure is elevated today.  Question if the T wave changes are mainly just related to strain from elevated blood pressure.     She had an echocardiogram a little over a year ago which demonstrated mild aortic valve prosthesis paravalvular  leak, normal EF and trivial mitral regurgitation.  I reviewed her case today with Dr. Okey (attending MD).  Overall, she is at moderately elevated risk for perioperative CV complications.  However, we do not think her risk is prohibitive.  She is not having any unstable cardiac symptoms.  She has been having hematuria with her bladder issue.  At this point, we do not think that further ischemic testing is warranted or would provide much benefit prior to her surgery.   - Patient may proceed with urologic procedure at moderately elevated but not prohibitive CV risk. - She can hold Eliquis  for 3 days prior to surgery and resume postop as soon as it is safe. - She can hold aspirin  for 5 days prior to surgery and resume postop as soon as it is safe  Patient with hx of CVA in 2020 after TAVR. Patient was noted to have a large right groin hematoma at the time of her TAVR which required surgical exploration.  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  LD Eliquis : 11/24 PM dose  VS: BP 139/62   Pulse 65   Temp 37 C (Oral)   Resp 18   Ht 5' 6 (1.676 m)   Wt 65.8 kg   SpO2 95%   BMI 23.40 kg/m   PROVIDERS: Dwight Trula SQUIBB, MD   LABS: Labs reviewed: Acceptable for surgery. (all labs ordered are listed, but only abnormal results are displayed)  Labs Reviewed  HEMOGLOBIN A1C - Abnormal; Notable  for the following components:      Result Value   Hgb A1c MFr Bld 8.8 (*)    All other components within normal limits  GLUCOSE, CAPILLARY - Abnormal; Notable for the following components:   Glucose-Capillary 188 (*)    All other components within normal limits    CT Renal 12/19/23:  IMPRESSION: 1. No urolithiasis, filling defects, or urinary tract dilation. No suspicious renal or urothelial lesions. 2. Trabeculated urinary bladder with pelvic laxity and small cystocele. 3. Unchanged 5.4 cm simple right  ovarian cyst, demonstrating 2 year stability. 4. Cholelithiasis. 5. Colonic diverticulosis without acute diverticulitis. 6.  Aortic Atherosclerosis (ICD10-I70.0).  EKG 02/02/24:  Unusual P axis, possible ectopic atrial rhythm Left ventricular hypertrophy with repolarization abnormality Inferolateral TW Inversions  Echo 3/1/224:  IMPRESSIONS    1. There appears to be paravaular leak. In the PSAX image series regurgitation looks significant but in the setting of prosthetic mitral valve artifact. The aortic valve has been repaired/replaced. Aortic valve regurgitation may be mild up to moderate but difficult to characterize on this study. There is a 23 mm Sapien prosthetic (TAVR) valve present in the aortic position. Procedure Date: 01/22/2019. Aortic valve mean gradient measures 13.0 mmHg.  2. The mitral valve has been repaired/replaced. Trivial mitral valve regurgitation. Mild mitral stenosis. The mean mitral valve gradient is 4.0 mmHg with average heart rate of 76 bpm. There is a 26 mm Memo prosthetic annuloplasty ring present in the mitral position. Procedure Date: 10/11/2012.  3. The tricuspid valve is status post repair on 05/27/2013 with a 26mm EMC3 annuloplasty ring.  4. Left ventricular ejection fraction, by estimation, is 65 to 70%. Left ventricular ejection fraction by 3D volume is 70 %. The left ventricle has normal function. The left ventricle has no regional wall motion abnormalities. There is moderate concentric left ventricular hypertrophy. Left ventricular diastolic parameters are indeterminate.  5. Right ventricular systolic function is hyperdynamic. The right ventricular size is normal.  Past Medical History:  Diagnosis Date   Acute CVA (cerebrovascular accident) (HCC) 01/24/2019   Anxiety    Arthritis    knees   Atrial fibrillation (HCC)    Breast cancer (HCC)    CAD (coronary artery disease)    a. pre TAVR cath 2020 mild nonobstructive CAD in the LAD and  CX with moderate nononstructive disease in the mid-distal RCA and PDA.   Cancer (HCC)    left breast   Cataracts, bilateral    CKD (chronic kidney disease), stage III (HCC)    CVA (cerebral vascular accident) (HCC)    Dementia (HCC)    Depression    Diabetes mellitus    DVT (deep venous thrombosis) (HCC)    hx of   Dyspnea    with exertion   Dysrhythmia    Full dentures    GERD (gastroesophageal reflux disease)    Groin hematoma    Hearing aid worn    B/L   History of TIA (transient ischemic attack)    mini stroke with vision problems   Hypercholesterolemia    Hypertension    Hypertensive nephropathy    MCI (mild cognitive impairment)    Memory loss    Middle cerebral artery stenosis, left    Mitral regurgitation    Paroxysmal atrial fibrillation (HCC)    Pneumonia    PONV (postoperative nausea and vomiting)    Premature atrial contraction    S/P Maze operation for atrial fibrillation 10/11/2012   Complete bilateral atrial lesion set using  bipolar radiofrequency and cryothermy ablation with clipping of LA appendage   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   26mm Sorin Memo 3D ring annuloplasty with 26 mm Edwards mc3 tricuspid ring annuloplasty    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   s/p TAVR with a 23 Edwards Sapien 3 Ultra THV via the TF approach   Thyroid  nodule    Tricuspid regurgitation    Wears glasses     Past Surgical History:  Procedure Laterality Date   ABDOMINAL AORTOGRAM W/LOWER EXTREMITY N/A 03/14/2022   Procedure: ABDOMINAL AORTOGRAM W/LOWER EXTREMITY;  Surgeon: Magda Debby SAILOR, MD;  [facility]: [facility];  Service: Cardiovascular;  Laterality: N/A;   ARTERY REPAIR Right 01/22/2019   Procedure: Right Common Femoral Artery Repair;  Surgeon: Eliza Lonni RAMAN, MD;  Location: [facility] OR;  Service: Vascular;  Laterality: Right;   CARDIAC CATHETERIZATION  09/05/2012   CARDIOVERSION N/A 07/31/2012   Procedure: CARDIOVERSION;   Surgeon: Maude JAYSON Emmer, MD;  Location: [facility] ENDOSCOPY;  Service: Cardiovascular;  Laterality: N/A;   CATARACT EXTRACTION W/ INTRAOCULAR LENS  IMPLANT, BILATERAL     CESAREAN SECTION     x 2   COLONOSCOPY WITH PROPOFOL  N/A 11/28/2014   Procedure: COLONOSCOPY WITH PROPOFOL ;  Surgeon: Belvie Just, MD;  Location: [facility]OS[facility];  Service: Endoscopy;  Laterality: N/A;   COLONOSCOPY WITH PROPOFOL  N/A 01/22/2021   Procedure: COLONOSCOPY WITH PROPOFOL ;  Surgeon: Just Belvie, MD;  Location: [facility] ENDOS[facility];  Service: Endoscopy;  Laterality: N/A;   DILATION AND CURETTAGE OF UTERUS     FEMORAL ARTERY EXPLORATION Right 01/22/2019   Procedure: RIGHT GROIN EXPLORATION with evacuation of hematoma;  Surgeon: Eliza Lonni RAMAN, MD;  Location: [facility] OR;  Service: Vascular;  Laterality: Right;   FLEXIBLE SIGMOIDOSCOPY N/A 12/18/2015   Procedure: FLEXIBLE SIGMOIDOSCOPY;  Surgeon: Belvie Just, MD;  Location: [facility] ENDOS[facility];  Service: Endoscopy;  Laterality: N/A;   INTRAMEDULLARY (IM) NAIL INTERTROCHANTERIC Right 05/28/2022   Procedure: INTRAMEDULLARY (IM) NAIL INTERTROCHANTERIC;  Surgeon: Kendal Franky SQUIBB, MD;  Location: [facility] OR;  Service: Orthopedics;  Laterality: Right;   INTRAOPERATIVE TRANSESOPHAGEAL ECHOCARDIOGRAM N/A 10/11/2012   Procedure: INTRAOPERATIVE TRANSESOPHAGEAL ECHOCARDIOGRAM;  Surgeon: Sudie VEAR Laine, MD;  Location: [facility] OR;  Service: Open Heart Surgery;  Laterality: N/A;   IR ANGIO INTRA EXTRACRAN SEL COM CAROTID INNOMINATE BILAT MOD SED  05/21/2019   IR ANGIO VERTEBRAL SEL VERTEBRAL BILAT MOD SED  05/21/2019   MASTECTOMY Left 1982   MAZE N/A 10/11/2012   Procedure: MAZE;  Surgeon: Sudie VEAR Laine, MD;  Location: [facility] OR;  Service: Open Heart Surgery;  Laterality: N/A;   MITRAL VALVE REPAIR N/A 10/11/2012   Procedure: MITRAL VALVE REPAIR (MVR);  Surgeon: Sudie VEAR Laine, MD;  Location: [facility] OR;  Service: Open Heart Surgery;  Laterality: N/A;   MULTIPLE TOOTH EXTRACTIONS     PERIPHERAL VASCULAR BALLOON  ANGIOPLASTY Right 03/14/2022   Procedure: PERIPHERAL VASCULAR BALLOON ANGIOPLASTY;  Surgeon: Magda Debby SAILOR, MD;  [facility];  Service: Cardiovascular;  Laterality: Right;  Peroneal   PERIPHERAL VASCULAR INTERVENTION Right 03/14/2022   Procedure: PERIPHERAL VASCULAR INTERVENTION;  Surgeon: Magda Debby SAILOR, MD;  [facility];  Service: Cardiovascular;  Laterality: Right;  SFA   RADIOLOGY WITH ANESTHESIA N/A 06/19/2019   Procedure: STENTING;  Surgeon: Dolphus Carrion, MD;  Location: [facility];  Service: Radiology;  Laterality: N/A;   RIGHT HEART CATH AND CORONARY ANGIOGRAPHY N/A 12/24/2018   Procedure: RIGHT HEART CATH AND CORONARY ANGIOGRAPHY;  Surgeon: Verlin Lonni BIRCH, MD;  Location: [facility] INVASIVE CV LAB;  Service: Cardiovascular;  Laterality: N/A;   TEE WITHOUT CARDIOVERSION N/A 07/31/2012   Procedure: TRANSESOPHAGEAL ECHOCARDIOGRAM (TEE);  Surgeon: Maude JAYSON Emmer, MD;  Location: [facility] ENDOSCOPY;  Service: Cardiovascular;  Laterality: N/A;   TEE WITHOUT CARDIOVERSION N/A 01/22/2019   Procedure: TRANSESOPHAGEAL ECHOCARDIOGRAM (TEE);  Surgeon: Verlin Lonni BIRCH, MD;  Location: [facility] INVASIVE CV LAB;  Service: Open Heart Surgery;  Laterality: N/A;   TRANSCATHETER AORTIC VALVE REPLACEMENT, TRANSFEMORAL N/A 01/22/2019   Procedure: TRANSCATHETER AORTIC VALVE REPLACEMENT, TRANSFEMORAL;  Surgeon: Verlin Lonni BIRCH, MD;  [facility];  Service: Open Heart Surgery;  Laterality: N/A;   TRICUSPID VALVE REPLACEMENT N/A 10/11/2012   Procedure: TRICUSPID VALVE REPAIR;  Surgeon: Sudie VEAR Laine, MD;  Location: [facility] OR;  Service: Open Heart Surgery;  Laterality: N/A;   TUBAL LIGATION      MEDICATIONS:  acetaminophen  (TYLENOL ) 500 MG tablet   apixaban  (ELIQUIS ) 2.5 MG TABS tablet   aspirin  EC 81 MG tablet   atorvastatin  (LIPITOR) 80 MG tablet   Calcium  Carb-Cholecalciferol (CALCIUM  600 + D PO)   Continuous Glucose Receiver (FREESTYLE LIBRE 3  READER) DEVI   Continuous Glucose Sensor (FREESTYLE LIBRE 3 PLUS SENSOR) MISC   docusate sodium  (COLACE) 100 MG capsule   donepezil  (ARICEPT ) 5 MG tablet   LANTUS  SOLOSTAR 100 UNIT/ML Solostar Pen   Multiple Vitamins-Minerals (MULTIVITAMIN WITH MINERALS) tablet   nitroGLYCERIN  (NITROSTAT ) 0.4 MG SL tablet   omeprazole (PRILOSEC) 40 MG capsule   pioglitazone (ACTOS) 30 MG tablet   PSYLLIUM PO   venlafaxine  XR (EFFEXOR -XR) 150 MG 24 hr capsule   venlafaxine  XR (EFFEXOR -XR) 75 MG 24 hr capsule   vitamin B-12 (CYANOCOBALAMIN ) 500 MCG tablet   No current facility-administered medications for this encounter.   Burnard CHRISTELLA Odis DEVONNA [facility] Short Stay/Anesthesiology [facility] Phone [phone] 02/12/2024 9:16 AM

## 2024-02-12 NOTE — Progress Notes (Signed)
 Request sent to Dr. CHRISTELLA Shank to view pt's pre op A1c from 02/09/24.

## 2024-02-13 ENCOUNTER — Ambulatory Visit (HOSPITAL_COMMUNITY): Payer: Self-pay | Admitting: Physician Assistant

## 2024-02-13 ENCOUNTER — Ambulatory Visit (HOSPITAL_COMMUNITY): Admitting: Anesthesiology

## 2024-02-13 ENCOUNTER — Ambulatory Visit (HOSPITAL_COMMUNITY)

## 2024-02-13 ENCOUNTER — Encounter (HOSPITAL_COMMUNITY)
Admission: RE | Disposition: A | Discharge status: Home / Self Care | Payer: Self-pay | Source: Home / Self Care | Attending: Urology

## 2024-02-13 ENCOUNTER — Ambulatory Visit (HOSPITAL_COMMUNITY)
Admission: RE | Admit: 2024-02-13 | Discharge: 2024-02-13 | Disposition: A | Discharge status: Home / Self Care | Attending: Urology | Admitting: Urology

## 2024-02-13 ENCOUNTER — Encounter (HOSPITAL_COMMUNITY): Payer: Self-pay | Admitting: Urology

## 2024-02-13 DIAGNOSIS — R310 Gross hematuria: Secondary | ICD-10-CM

## 2024-02-13 DIAGNOSIS — R3914 Feeling of incomplete bladder emptying: Secondary | ICD-10-CM | POA: Insufficient documentation

## 2024-02-13 DIAGNOSIS — R351 Nocturia: Secondary | ICD-10-CM | POA: Diagnosis not present

## 2024-02-13 DIAGNOSIS — F0390 Unspecified dementia without behavioral disturbance: Secondary | ICD-10-CM | POA: Insufficient documentation

## 2024-02-13 DIAGNOSIS — K219 Gastro-esophageal reflux disease without esophagitis: Secondary | ICD-10-CM | POA: Diagnosis not present

## 2024-02-13 DIAGNOSIS — F419 Anxiety disorder, unspecified: Secondary | ICD-10-CM | POA: Diagnosis not present

## 2024-02-13 DIAGNOSIS — D414 Neoplasm of uncertain behavior of bladder: Secondary | ICD-10-CM | POA: Diagnosis present

## 2024-02-13 DIAGNOSIS — Z794 Long term (current) use of insulin: Secondary | ICD-10-CM | POA: Diagnosis not present

## 2024-02-13 DIAGNOSIS — I6990 Unspecified sequelae of unspecified cerebrovascular disease: Secondary | ICD-10-CM | POA: Diagnosis not present

## 2024-02-13 DIAGNOSIS — E119 Type 2 diabetes mellitus without complications: Secondary | ICD-10-CM | POA: Insufficient documentation

## 2024-02-13 DIAGNOSIS — I10 Essential (primary) hypertension: Secondary | ICD-10-CM | POA: Diagnosis not present

## 2024-02-13 DIAGNOSIS — Z7984 Long term (current) use of oral hypoglycemic drugs: Secondary | ICD-10-CM | POA: Insufficient documentation

## 2024-02-13 DIAGNOSIS — N329 Bladder disorder, unspecified: Secondary | ICD-10-CM | POA: Insufficient documentation

## 2024-02-13 DIAGNOSIS — Z952 Presence of prosthetic heart valve: Secondary | ICD-10-CM | POA: Insufficient documentation

## 2024-02-13 DIAGNOSIS — Z7901 Long term (current) use of anticoagulants: Secondary | ICD-10-CM | POA: Insufficient documentation

## 2024-02-13 DIAGNOSIS — I2510 Atherosclerotic heart disease of native coronary artery without angina pectoris: Secondary | ICD-10-CM | POA: Diagnosis not present

## 2024-02-13 HISTORY — PX: TRANSURETHRAL RESECTION OF BLADDER TUMOR: SHX2575

## 2024-02-13 LAB — GLUCOSE, CAPILLARY: Glucose-Capillary: 144 mg/dL — ABNORMAL HIGH (ref 70–99)

## 2024-02-13 SURGERY — TURBT (TRANSURETHRAL RESECTION OF BLADDER TUMOR)
Anesthesia: General

## 2024-02-13 MED ORDER — ROCURONIUM BROMIDE 10 MG/ML (PF) SYRINGE
PREFILLED_SYRINGE | INTRAVENOUS | Status: AC
  Filled 2024-02-13: qty 10

## 2024-02-13 MED ORDER — IOHEXOL 300 MG/ML  SOLN
INTRAMUSCULAR | Status: DC | PRN
  Administered 2024-02-13: 21 mL via URETHRAL

## 2024-02-13 MED ORDER — CHLORHEXIDINE GLUCONATE 0.12 % MT SOLN
15.0000 mL | Freq: Once | OROMUCOSAL | Status: AC
  Administered 2024-02-13: 15 mL via OROMUCOSAL

## 2024-02-13 MED ORDER — CEFAZOLIN SODIUM-DEXTROSE 2-4 GM/100ML-% IV SOLN
2.0000 g | INTRAVENOUS | Status: AC
  Administered 2024-02-13: 2 g via INTRAVENOUS
  Filled 2024-02-13: qty 100

## 2024-02-13 MED ORDER — ORAL CARE MOUTH RINSE: 15.0000 mL | Freq: Once | OROMUCOSAL | Status: AC

## 2024-02-13 MED ORDER — DEXAMETHASONE SOD PHOSPHATE PF 10 MG/ML IJ SOLN
INTRAMUSCULAR | Status: DC | PRN
  Administered 2024-02-13: 4 mg via INTRAVENOUS

## 2024-02-13 MED ORDER — PHENYLEPHRINE HCL (PRESSORS) 10 MG/ML IV SOLN
INTRAVENOUS | Status: AC
Start: 2024-02-13 — End: 2024-02-13
  Filled 2024-02-13: qty 1

## 2024-02-13 MED ORDER — PHENYLEPHRINE HCL (PRESSORS) 10 MG/ML IV SOLN
INTRAVENOUS | Status: AC
  Filled 2024-02-13: qty 1

## 2024-02-13 MED ORDER — SUGAMMADEX SODIUM 200 MG/2ML IV SOLN
INTRAVENOUS | Status: DC | PRN
  Administered 2024-02-13: 200 mg via INTRAVENOUS

## 2024-02-13 MED ORDER — LACTATED RINGERS IV SOLN: INTRAVENOUS | Status: DC

## 2024-02-13 MED ORDER — PROPOFOL 10 MG/ML IV BOLUS
INTRAVENOUS | Status: AC
  Filled 2024-02-13: qty 20

## 2024-02-13 MED ORDER — PROPOFOL 1000 MG/100ML IV EMUL
INTRAVENOUS | Status: AC
  Filled 2024-02-13: qty 100

## 2024-02-13 MED ORDER — PROPOFOL 10 MG/ML IV BOLUS
INTRAVENOUS | Status: DC | PRN
  Administered 2024-02-13: 20 mg via INTRAVENOUS
  Administered 2024-02-13: 110 mg via INTRAVENOUS
  Administered 2024-02-13: 80 ug/kg/min via INTRAVENOUS

## 2024-02-13 MED ORDER — ROCURONIUM BROMIDE 100 MG/10ML IV SOLN
INTRAVENOUS | Status: DC | PRN
  Administered 2024-02-13: 40 mg via INTRAVENOUS

## 2024-02-13 MED ORDER — FENTANYL CITRATE (PF) 100 MCG/2ML IJ SOLN
INTRAMUSCULAR | Status: DC | PRN
  Administered 2024-02-13: 25 ug via INTRAVENOUS
  Administered 2024-02-13: 50 ug via INTRAVENOUS
  Administered 2024-02-13: 25 ug via INTRAVENOUS

## 2024-02-13 MED ORDER — ONDANSETRON HCL 4 MG/2ML IJ SOLN
INTRAMUSCULAR | Status: AC
  Filled 2024-02-13: qty 2

## 2024-02-13 MED ORDER — ONDANSETRON HCL 4 MG/2ML IJ SOLN
INTRAMUSCULAR | Status: DC | PRN
  Administered 2024-02-13: 4 mg via INTRAVENOUS

## 2024-02-13 MED ORDER — FENTANYL CITRATE (PF) 100 MCG/2ML IJ SOLN
INTRAMUSCULAR | Status: AC
  Filled 2024-02-13: qty 2

## 2024-02-13 MED ORDER — INSULIN ASPART 100 UNIT/ML IJ SOLN: 0.0000 [IU] | INTRAMUSCULAR | Status: DC | PRN

## 2024-02-13 MED ORDER — SUGAMMADEX SODIUM 200 MG/2ML IV SOLN
INTRAVENOUS | Status: AC
  Filled 2024-02-13: qty 2

## 2024-02-13 MED ORDER — FENTANYL CITRATE (PF) 50 MCG/ML IJ SOSY: 25.0000 ug | PREFILLED_SYRINGE | INTRAMUSCULAR | Status: DC | PRN

## 2024-02-13 SURGICAL SUPPLY — 16 items
BAG URINE DRAIN 2000ML AR STRL (UROLOGICAL SUPPLIES) IMPLANT
BAG URO CATCHER STRL LF (MISCELLANEOUS) ×2 IMPLANT
CATH URETL OPEN END 6FR 70 (CATHETERS) IMPLANT
CLOTH BEACON ORANGE TIMEOUT ST (SAFETY) ×2 IMPLANT
DRAPE FOOT SWITCH (DRAPES) ×2 IMPLANT
ELECT REM PT RETURN 15FT ADLT (MISCELLANEOUS) IMPLANT
GLOVE BIO SURGEON STRL SZ 6.5 (GLOVE) ×2 IMPLANT
GOWN STRL REUS W/ TWL LRG LVL3 (GOWN DISPOSABLE) ×2 IMPLANT
KIT TURNOVER KIT A (KITS) ×2 IMPLANT
LOOP CUT BIPOLAR 24F LRG (ELECTROSURGICAL) IMPLANT
MANIFOLD NEPTUNE II (INSTRUMENTS) ×2 IMPLANT
PACK CYSTO (CUSTOM PROCEDURE TRAY) ×2 IMPLANT
PAD PREP 24X48 CUFFED NSTRL (MISCELLANEOUS) ×2 IMPLANT
SYRINGE TOOMEY IRRIG 70ML (MISCELLANEOUS) IMPLANT
TUBING CONNECTING 10 (TUBING) ×2 IMPLANT
TUBING UROLOGY SET (TUBING) ×2 IMPLANT

## 2024-02-13 NOTE — Anesthesia Postprocedure Evaluation (Signed)
 Anesthesia Post Note  Patient: Rachel Santos  Procedure(s) Performed: TURBT (TRANSURETHRAL RESECTION OF BLADDER TUMOR), fULGERATION, BILATERAL RETROGRAGE, BLADDER BIOPSY     Patient location during evaluation: PACU Anesthesia Type: MAC Level of consciousness: awake and alert Pain management: pain level controlled Vital Signs Assessment: post-procedure vital signs reviewed and stable Respiratory status: spontaneous breathing, nonlabored ventilation and respiratory function stable Cardiovascular status: stable and blood pressure returned to baseline Postop Assessment: no apparent nausea or vomiting Anesthetic complications: no   No notable events documented.  Last Vitals:  Vitals:   02/13/24 1718 02/13/24 1745  BP: (!) 174/90   Pulse: 64 60  Resp:    Temp: 36.7 C   SpO2: 97% 94%    Last Pain:  Vitals:   02/13/24 1745  TempSrc:   PainSc: 0-No pain                 Lyon Dumont

## 2024-02-13 NOTE — Discharge Instructions (Signed)
 Post Bladder Surgery Instructions   General instructions:     Your recent bladder surgery requires very little post hospital care but some definite precautions.  Despite the fact that no skin incisions were used, the area around the bladder incisions are raw and covered with scabs to promote healing and prevent bleeding. Certain precautions are needed to insure that the scabs are not disturbed over the next 2-4 weeks while the healing proceeds.  Because the raw surface inside your bladder and the irritating effects of urine you may expect frequency of urination and/or urgency (a stronger desire to urinate) and perhaps even getting up at night more often. This will usually resolve or improve slowly over the healing period. You may see some blood in your urine over the first 6 weeks. Do not be alarmed, even if the urine was clear for a while. Get off your feet and drink lots of fluids until clearing occurs. If you start to pass clots or don't improve call us .  Catheter: (If you are discharged with a catheter.)  1. Keep your catheter secured to your leg at all times with tape or the supplied strap. 2. You may experience leakage of urine around your catheter- as long as the  catheter continues to drain, this is normal.  If your catheter stops draining  go to the ER. 3. You may also have blood in your urine, even after it has been clear for  several days; you may even pass some small blood clots or other material.  This  is normal as well.  If this happens, sit down and drink plenty of water to help  make urine to flush out your bladder.  If the blood in your urine becomes worse  after doing this, contact our office or return to the ER. 4. You may use the leg bag (small bag) during the day, but use the large bag at  night.  Diet:  You may return to your normal diet immediately. Because of the raw surface of your bladder, alcohol, spicy foods, foods high in acid and drinks with caffeine may  cause irritation or frequency and should be used in moderation. To keep your urine flowing freely and avoid constipation, drink plenty of fluids during the day (8-10 glasses). Tip: Avoid cranberry juice because it is very acidic.  Activity:  Your physical activity doesn't need to be restricted. However, if you are very active, you may see some blood in the urine. We suggest that you reduce your activity under the circumstances until the bleeding has stopped.  Bowels:  It is important to keep your bowels regular during the postoperative period. Straining with bowel movements can cause bleeding. A bowel movement every other day is reasonable. Use a mild laxative if needed, such as milk of magnesia 2-3 tablespoons, or 2 Dulcolax tablets. Call if you continue to have problems. If you had been taking narcotics for pain, before, during or after your surgery, you may be constipated. Take a laxative if necessary.    Medication:  Do not resume aspirin  or blood thinners until urine has cleared. Earliest would be Friday, November 21st.  You should resume your pre-surgery medications unless told not to. In addition you may be given an antibiotic to prevent or treat infection. Antibiotics are not always necessary. All medication should be taken as prescribed until the bottles are finished unless you are having an unusual reaction to one of the drugs.

## 2024-02-13 NOTE — Op Note (Signed)
 Operative Note  Preoperative diagnosis:  1.  Gross hematuria 2.  Abnormal bladder mucosa  Postoperative diagnosis: 1.  Gross hematuria 2.  Abnormal bladder mucosa  Procedure(s): 1.  Cystoscopy with bladder biopsy and fulguration 2.  Bilateral retrograde pyelogram  Surgeon: Valli Shank, MD  Assistants:  None  Anesthesia:  General  Complications:  None  EBL:  minimal  Specimens: 1. Bladder biopsy  Drains/Catheters: 1.  none  Intraoperative findings:   Normal urethra Bilateral orthotopic Uos.  Right patulous ureteral orifice. Right retrograde pyelogram no filling defects however patient did have a dilated ureter to the level of the bladder. Left retrograde pyelogram without filling defects Bladder mucosa with erythematous patchy mucosa on right posterior superior bladder wall consistent with diagnostic cystoscopy done in the office  Indication:  Rachel Santos is a 83 y.o. female with gross hematuria.  Diagnostic cystoscopy showed erythematous patchy mucosa on posterior wall.  CT showed unopacified segment of left distal ureter.   Description of procedure:  After risks and benefits of the procedure discussed with the patient, informed consent was obtained.  The patient is taken to the operating was in the supine position.  Anesthesia was induced and antibiotics were administered.  She was then repositioned in the dorsolithotomy position.  She was prepped and draped in the usual sterile fashion on top was performed.  A 21 French rigid cystoscope was passed in the with meatus and advanced into the bladder under direct visualization.  Findings are noted above.  The left ureteral orifice was intubated with a open-ended ureteral catheter and an retrograde pyelogram was obtained.  A right retrograde pyelogram was obtained in the similar manner.  Next attention turned to the posterior superior bladder mucosa that was abnormal on diagnostic cystoscopy in the office.  It was  again seen.  A cold cup bladder biopsy was taken from this area.  The resectoscope using the bipolar electrocautery loop was then used to cauterize this area.  Hemostasis was adequate with the irrigant turned off.  The resectoscope was removed.  The patient emerged from anesthesia and was transferred the PACU in stable condition.  Plan:  discharge home

## 2024-02-13 NOTE — Interval H&P Note (Signed)
 History and Physical Interval Note: Will add retrograde pyelogram b/l  02/13/2024 3:48 PM  Rachel Santos  has presented today for surgery, with the diagnosis of BLADDER TUMOR.  The various methods of treatment have been discussed with the patient and family. After consideration of risks, benefits and other options for treatment, the patient has consented to  Procedure(s) with comments: TURBT (TRANSURETHRAL RESECTION OF BLADDER TUMOR) (N/A) - TURBT (TRANSURETHRAL RESECTION OF BLADDER TUMOR) as a surgical intervention.  The patient's history has been reviewed, patient examined, no change in status, stable for surgery.  I have reviewed the patient's chart and labs.  Questions were answered to the patient's satisfaction.     Dianca Owensby D Xabi Wittler

## 2024-02-13 NOTE — Transfer of Care (Signed)
 Immediate Anesthesia Transfer of Care Note  Patient: Rachel Santos  Procedure(s) Performed: TURBT (TRANSURETHRAL RESECTION OF BLADDER TUMOR), fULGERATION, BILATERAL RETROGRAGE, BLADDER BIOPSY  Patient Location: PACU  Anesthesia Type:General  Level of Consciousness: awake  Airway & Oxygen Therapy: Patient Spontanous Breathing  Post-op Assessment: Report given to RN and Post -op Vital signs reviewed and stable  Post vital signs: Reviewed and stable  Last Vitals:  Vitals Value Taken Time  BP 185/56 02/13/24 17:18  Temp    Pulse 64 02/13/24 17:21  Resp 20 02/13/24 17:21  SpO2 93 % 02/13/24 17:21  Vitals shown include unfiled device data.  Last Pain:  Vitals:   02/13/24 1400  TempSrc: Oral         Complications: No notable events documented.

## 2024-02-13 NOTE — Anesthesia Procedure Notes (Addendum)
 Procedure Name: Intubation Date/Time: 02/13/2024 4:34 PM  Performed by: Nada Corean CROME, CRNAPre-anesthesia Checklist: Emergency Drugs available, Patient identified, Suction available, Patient being monitored and Timeout performed Patient Re-evaluated:Patient Re-evaluated prior to induction Oxygen Delivery Method: Circle system utilized Preoxygenation: Pre-oxygenation with 100% oxygen Induction Type: IV induction Ventilation: Mask ventilation without difficulty Laryngoscope Size: Mac and 3 Grade View: Grade I Tube type: Oral Tube size: 7.0 mm Number of attempts: 1 Airway Equipment and Method: Stylet Placement Confirmation: ETT inserted through vocal cords under direct vision, positive ETCO2 and breath sounds checked- equal and bilateral Secured at: 19 cm Tube secured with: Tape Dental Injury: Teeth and Oropharynx as per pre-operative assessment

## 2024-02-14 ENCOUNTER — Encounter (HOSPITAL_COMMUNITY): Payer: Self-pay | Admitting: Urology

## 2024-02-15 ENCOUNTER — Encounter: Payer: Self-pay | Admitting: Cardiovascular Disease

## 2024-02-15 LAB — SURGICAL PATHOLOGY

## 2024-02-15 NOTE — Telephone Encounter (Signed)
 Call placed to preferred contact number.  Left message that I had replied to the my chart message

## 2024-02-15 NOTE — Telephone Encounter (Signed)
 I spoke with patient's daughter and let her know Dr Verlin advised patient go to ED

## 2024-03-12 ENCOUNTER — Ambulatory Visit (HOSPITAL_COMMUNITY)

## 2024-04-01 ENCOUNTER — Encounter: Payer: Self-pay | Admitting: Diagnostic Neuroimaging

## 2024-04-01 ENCOUNTER — Ambulatory Visit: Admitting: Diagnostic Neuroimaging

## 2024-04-01 VITALS — BP 132/66 | HR 64

## 2024-04-01 DIAGNOSIS — F01B0 Vascular dementia, moderate, without behavioral disturbance, psychotic disturbance, mood disturbance, and anxiety: Secondary | ICD-10-CM

## 2024-04-01 NOTE — Progress Notes (Signed)
 "  [facility]  PATIENT: Rachel Santos DOB: 1941-03-07  REFERRING CLINICIAN: Dwight Trula SQUIBB, MD HISTORY FROM: patient  REASON FOR VISIT: new consult   HISTORICAL  CHIEF COMPLAINT:  Chief Complaint  Patient presents with   RM 7    Patient is here with daughter [name], for Aphasia and Dementia - no change since the last she was seen in 2024 with Dr. Rush. Needs Donepezil  refilled     HISTORY OF PRESENT ILLNESS:   84 year old female here for evaluation of memory loss and dementia.  History of embolic strokes in 2020.  Was evaluated in the at this practice in 2023 for memory loss and dementia evaluation.  MMSE declined from 25/30 to 18/30, and she was diagnosed with dementia.  She was started donepezil .  Patient was living alone but now is living with daughter.  Has had gradual and progressive decline in symptoms and decline in ADLs.  Needs help with finances and medications.  She no longer drives.  She is able to maintain some of her personal hygiene and ADLs.   REVIEW OF SYSTEMS: Full 14 system review of systems performed and negative with exception of: as per HPI.  ALLERGIES: Allergies[1]  HOME MEDICATIONS: Outpatient Medications Prior to Visit  Medication Sig Dispense Refill   acetaminophen  (TYLENOL ) 500 MG tablet Take 1,000 mg by mouth every 6 (six) hours as needed for moderate pain or headache.     apixaban  (ELIQUIS ) 2.5 MG TABS tablet Take 1 tablet by mouth twice daily 180 tablet 1   aspirin  EC 81 MG tablet Take 81 mg by mouth daily. Swallow whole.     atorvastatin  (LIPITOR) 80 MG tablet Take 1 tablet by mouth once daily (Patient taking differently: Take 80 mg by mouth at bedtime.) 90 tablet 3   Calcium  Carb-Cholecalciferol (CALCIUM  600 + D PO) Take 1 tablet by mouth 2 (two) times daily.     Continuous Glucose Receiver (FREESTYLE LIBRE 3 READER) DEVI as directed.     Continuous Glucose Sensor (FREESTYLE LIBRE 3 PLUS SENSOR) MISC SMARTSIG:1 Every 2 Weeks      LANTUS  SOLOSTAR 100 UNIT/ML Solostar Pen INJECT 39 UNITS UNDER THE SKIN SUBCUTANEOUSLY ONCE DAILY EVERY MORNING; Duration: 75 days     Multiple Vitamins-Minerals (MULTIVITAMIN WITH MINERALS) tablet Take 1 tablet by mouth daily.     nitroGLYCERIN  (NITROSTAT ) 0.4 MG SL tablet Place 1 tablet (0.4 mg total) under the tongue every 5 (five) minutes as needed for chest pain. 25 tablet 3   omeprazole (PRILOSEC) 40 MG capsule Take 40 mg by mouth daily before breakfast.      pioglitazone (ACTOS) 30 MG tablet Take 30 mg by mouth daily.     PSYLLIUM PO Take 2.5 mLs by mouth daily at 12 noon.     venlafaxine  XR (EFFEXOR -XR) 150 MG 24 hr capsule Take 150 mg by mouth daily with breakfast. Take with 75 mg capsule to equal 225 mg daily     venlafaxine  XR (EFFEXOR -XR) 75 MG 24 hr capsule Take 75 mg by mouth daily with breakfast. Take with 150 mg capsule to equal 225 mg daily     vitamin B-12 (CYANOCOBALAMIN ) 500 MCG tablet Take 500 mcg by mouth every Monday.     donepezil  (ARICEPT ) 5 MG tablet Take 1 tablet (5 mg total) by mouth daily with breakfast. 30 tablet 2   No facility-administered medications prior to visit.    PAST MEDICAL HISTORY: Past Medical History:  Diagnosis Date   Acute CVA (cerebrovascular  accident) (HCC) 01/24/2019   Anxiety    Arthritis    knees   Atrial fibrillation (HCC)    Breast cancer (HCC)    CAD (coronary artery disease)    a. pre TAVR cath 2020 mild nonobstructive CAD in the LAD and CX with moderate nononstructive disease in the mid-distal RCA and PDA.   Cancer (HCC)    left breast   Cataracts, bilateral    CVA (cerebral vascular accident) (HCC)    Dementia (HCC)    Depression    Diabetes mellitus    DVT (deep venous thrombosis) (HCC)    hx of   Dyspnea    with exertion   Dysrhythmia    Full dentures    GERD (gastroesophageal reflux disease)    Groin hematoma    Hearing aid worn    B/L   History of TIA (transient ischemic attack)    mini stroke with vision  problems   Hypercholesterolemia    Hypertension    Hypertensive nephropathy    MCI (mild cognitive impairment)    Memory loss    Middle cerebral artery stenosis, left    Mitral regurgitation    Paroxysmal atrial fibrillation (HCC)    Pneumonia    PONV (postoperative nausea and vomiting)    Premature atrial contraction    S/P Maze operation for atrial fibrillation 10/11/2012   Complete bilateral atrial lesion set using bipolar radiofrequency and cryothermy ablation with clipping of LA appendage   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   26mm Sorin Memo 3D ring annuloplasty with 26 mm Edwards mc3 tricuspid ring annuloplasty    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   s/p TAVR with a 23 Edwards Sapien 3 Ultra THV via the TF approach   Thyroid  nodule    Tricuspid regurgitation    Wears glasses     PAST SURGICAL HISTORY: Past Surgical History:  Procedure Laterality Date   ABDOMINAL AORTOGRAM W/LOWER EXTREMITY N/A 03/14/2022   Procedure: ABDOMINAL AORTOGRAM W/LOWER EXTREMITY;  Surgeon: Magda Debby SAILOR, MD;  Location: [facility]VASIVE [facility];  Service: Cardiovascular;  Laterality: N/A;   ARTERY REPAIR Right 01/22/2019   Procedure: Right Common Femoral Artery Repair;  Surgeon: Eliza Lonni RAMAN, MD;  Location: [facility] OR;  Service: Vascular;  Laterality: Right;   CARDIAC CATHETERIZATION  09/05/2012   CARDIOVERSION N/A 07/31/2012   Procedure: CARDIOVERSION;  Surgeon: Maude JAYSON Emmer, MD;  Location: [facility] ENDOSCOPY;  Service: Cardiovascular;  Laterality: N/A;   CATARACT EXTRACTION W/ INTRAOCULAR LENS  IMPLANT, BILATERAL     CESAREAN SECTION     x 2   COLONOSCOPY WITH PROPOFOL  N/A 11/28/2014   Procedure: COLONOSCOPY WITH PROPOFOL ;  Surgeon: Belvie Just, MD;  Location: [facility] ENDOSCOPY;  Service: Endoscopy;  Laterality: N/A;   COLONOSCOPY WITH PROPOFOL  N/A 01/22/2021   Procedure: COLONOSCOPY WITH PROPOFOL ;  Surgeon: Just Belvie, MD;  Location: [facility] ENDOSCOPY;  Service: Endoscopy;   Laterality: N/A;   DILATION AND CURETTAGE OF UTERUS     FEMORAL ARTERY EXPLORATION Right 01/22/2019   Procedure: RIGHT GROIN EXPLORATION with evacuation of hematoma;  Surgeon: Eliza Lonni RAMAN, MD;  Location: [facility] OR;  Service: Vascular;  Laterality: Right;   FLEXIBLE SIGMOIDOSCOPY N/A 12/18/2015   Procedure: FLEXIBLE SIGMOIDOSCOPY;  Surgeon: Belvie Just, MD;  Location: [facility] ENDOSCOPY;  Service: Endoscopy;  Laterality: N/A;   INTRAMEDULLARY (IM) NAIL INTERTROCHANTERIC Right 05/28/2022   Procedure: INTRAMEDULLARY (IM) NAIL INTERTROCHANTERIC;  Surgeon: Kendal Franky SQUIBB, MD;  Location: [facility] OR;  Service: Orthopedics;  Laterality: Right;  INTRAOPERATIVE TRANSESOPHAGEAL ECHOCARDIOGRAM N/A 10/11/2012   Procedure: INTRAOPERATIVE TRANSESOPHAGEAL ECHOCARDIOGRAM;  Surgeon: Sudie VEAR Laine, MD;  Location: [facility] OR;  Service: Open Heart Surgery;  Laterality: N/A;   IR ANGIO INTRA EXTRACRAN SEL COM CAROTID INNOMINATE BILAT MOD SED  05/21/2019   IR ANGIO VERTEBRAL SEL VERTEBRAL BILAT MOD SED  05/21/2019   MASTECTOMY Left 1982   MAZE N/A 10/11/2012   Procedure: MAZE;  Surgeon: Sudie VEAR Laine, MD;  Location: [facility] OR;  Service: Open Heart Surgery;  Laterality: N/A;   MITRAL VALVE REPAIR N/A 10/11/2012   Procedure: MITRAL VALVE REPAIR (MVR);  Surgeon: Sudie VEAR Laine, MD;  Location: [facility] OR;  Service: Open Heart Surgery;  Laterality: N/A;   MULTIPLE TOOTH EXTRACTIONS     PERIPHERAL VASCULAR BALLOON ANGIOPLASTY Right 03/14/2022   Procedure: PERIPHERAL VASCULAR BALLOON ANGIOPLASTY;  Surgeon: Magda Debby SAILOR, MD;  Location: [facility];  Service: Cardiovascular;  Laterality: Right;  Peroneal   PERIPHERAL VASCULAR INTERVENTION Right 03/14/2022   Procedure: PERIPHERAL VASCULAR INTERVENTION;  Surgeon: Magda Debby SAILOR, MD;  Location: [facility];  Service: Cardiovascular;  Laterality: Right;  SFA   RADIOLOGY WITH ANESTHESIA N/A 06/19/2019   Procedure: STENTING;  Surgeon: Dolphus Carrion, MD;  Location:  [facility] OR;  Service: Radiology;  Laterality: N/A;   RIGHT HEART CATH AND CORONARY ANGIOGRAPHY N/A 12/24/2018   Procedure: RIGHT HEART CATH AND CORONARY ANGIOGRAPHY;  Surgeon: Verlin Lonni BIRCH, MD;  Location: [facility];  Service: Cardiovascular;  Laterality: N/A;   TEE WITHOUT CARDIOVERSION N/A 07/31/2012   Procedure: TRANSESOPHAGEAL ECHOCARDIOGRAM (TEE);  Surgeon: Maude JAYSON Emmer, MD;  Location: [facility] ENDOSCOPY;  Service: Cardiovascular;  Laterality: N/A;   TEE WITHOUT CARDIOVERSION N/A 01/22/2019   Procedure: TRANSESOPHAGEAL ECHOCARDIOGRAM (TEE);  Surgeon: Verlin Lonni BIRCH, MD;  Location: [facility] INVASIVE CV LAB;  Service: Open Heart Surgery;  Laterality: N/A;   TRANSCATHETER AORTIC VALVE REPLACEMENT, TRANSFEMORAL N/A 01/22/2019   Procedure: TRANSCATHETER AORTIC VALVE REPLACEMENT, TRANSFEMORAL;  Surgeon: Verlin Lonni BIRCH, MD;  Location: [facility];  Service: Open Heart Surgery;  Laterality: N/A;   TRANSURETHRAL RESECTION OF BLADDER TUMOR N/A 02/13/2024   Procedure: TURBT (TRANSURETHRAL RESECTION OF BLADDER TUMOR), fULGERATION, BILATERAL RETROGRAGE, BLADDER BIOPSY;  Surgeon: Elisabeth Valli BIRCH, MD;  Location: [facility] ORS;  Service: Urology;  Laterality: N/A;  TURBT (TRANSURETHRAL RESECTION OF BLADDER TUMOR)   TRICUSPID VALVE REPLACEMENT N/A 10/11/2012   Procedure: TRICUSPID VALVE REPAIR;  Surgeon: Sudie VEAR Laine, MD;  Location: [facility] OR;  Service: Open Heart Surgery;  Laterality: N/A;   TUBAL LIGATION      FAMILY HISTORY: Family History  Problem Relation Age of Onset   Alcohol abuse Mother    Hypertension Father    Prostate cancer Father    Heart attack Paternal Grandfather    Migraines Other    Stroke Neg Hx    Breast cancer Neg Hx    BRCA 1/2 Neg Hx    Seizures Neg Hx     SOCIAL HISTORY: Social History   Socioeconomic History   Marital status: Widowed    Spouse name: Not on file   Number of children: 3   Years of education: 12   Highest education level: High  school graduate  Occupational History   Occupation: Copywriter, Advertising  Tobacco Use   Smoking status: Never    Passive exposure: Never   Smokeless tobacco: Never  Vaping Use   Vaping status: Never Used  Substance and Sexual Activity   Alcohol use:  No   Drug use: No   Sexual activity: Never  Other Topics Concern   Not on file  Social History Narrative   3 cups coffee per day   Right handed   Lives with  Daughter, [name]. Second daughter Tobias helps with care/ medication    Social Drivers of Health   Tobacco Use: Low Risk (04/01/2024)   Patient History    Smoking Tobacco Use: Never    Smokeless Tobacco Use: Never    Passive Exposure: Never  Financial Resource Strain: Not on file  Food Insecurity: No Food Insecurity (12/20/2021)   Hunger Vital Sign    Worried About Running Out of Food in the Last Year: Never true    Ran Out of Food in the Last Year: Never true  Transportation Needs: No Transportation Needs (12/20/2021)   PRAPARE - Administrator, Civil Service (Medical): No    Lack of Transportation (Non-Medical): No  Physical Activity: Not on file  Stress: Not on file  Social Connections: Unknown (08/10/2021)   Received from [facility]   Social Network    Social Network: Not on file  Intimate Partner Violence: Not At Risk (12/20/2021)   Humiliation, Afraid, Rape, and Kick questionnaire    Fear of Current or Ex-Partner: No    Emotionally Abused: No    Physically Abused: No    Sexually Abused: No  Depression (PHQ2-9): Not on file  Alcohol Screen: Not on file  Housing: Low Risk (12/20/2021)   Housing    Last Housing Risk Score: 0  Utilities: Not At Risk (12/20/2021)   AHC Utilities    Threatened with loss of utilities: No  Health Literacy: Not on file     PHYSICAL EXAM  GENERAL EXAM/CONSTITUTIONAL: Vitals:  Vitals:   04/01/24 1504  BP: 132/66  Pulse: 64  SpO2: 96%   There is no height or weight on file to calculate BMI. Wt  Readings from Last 3 Encounters:  02/13/24 145 lb (65.8 kg)  02/09/24 145 lb (65.8 kg)  02/02/24 121 lb (54.9 kg)   Patient is in no distress; well developed, nourished and groomed; neck is supple  CARDIOVASCULAR: Examination of carotid arteries is normal; no carotid bruits Regular rate and rhythm, SYSTOLIC MURMUR Examination of peripheral vascular system by observation and palpation is normal  EYES: Ophthalmoscopic exam of optic discs and posterior segments is normal; no papilledema or hemorrhages No results found.  MUSCULOSKELETAL: Gait, strength, tone, movements noted in Neurologic exam below  NEUROLOGIC: MENTAL STATUS:     04/01/2024    3:44 PM 06/20/2022    2:39 PM 04/19/2021    9:14 AM  MMSE - Mini Mental State Exam  Orientation to time 0 0 5  Orientation to Place 5 5 4   Registration 3 3 3   Attention/ Calculation 0 0 2  Recall 0 1 3  Language- name 2 objects 2 2 2   Language- repeat 1 1 1   Language- follow 3 step command 3 3 3   Language- read & follow direction 1 1 1   Write a sentence 1 1 1   Copy design 0 1 0  Copy design-comments   only drew 1/2 of it  Total score 16 18 25    awake, alert, oriented to person, place [facility] memory and recall DECR attention and concentration language fluent, comprehension intact, naming intact fund of knowledge appropriate  CRANIAL NERVE:  2nd - no papilledema on fundoscopic exam 2nd, 3rd, 4th, 6th - pupils equal and reactive to light,  visual fields full to confrontation, extraocular muscles intact, no nystagmus 5th - facial sensation symmetric 7th - facial strength symmetric 8th - hearing intact 9th - palate elevates symmetrically, uvula midline 11th - shoulder shrug symmetric 12th - tongue protrusion midline  MOTOR:  normal bulk and tone, full strength in the BUE, BLE  SENSORY:  normal and symmetric to light touch, temperature, vibration  COORDINATION:  finger-nose-finger, fine finger movements normal  REFLEXES:  deep  tendon reflexes TRACE and symmetric  GAIT/STATION:  narrow based gait; IN WHEELCHAIR     DIAGNOSTIC DATA (LABS, IMAGING, TESTING) - I reviewed patient records, labs, notes, testing and imaging myself where available.  Lab Results  Component Value Date   WBC 6.6 01/14/2024   HGB 12.4 01/14/2024   HCT 38.7 01/14/2024   MCV 92.8 01/14/2024   PLT 211 01/14/2024      Component Value Date/Time   NA 137 01/14/2024 1210   NA 137 12/17/2018 1006   K 4.6 01/14/2024 1210   CL 102 01/14/2024 1210   CO2 28 01/14/2024 1210   GLUCOSE 199 (H) 01/14/2024 1210   BUN 25 (H) 01/14/2024 1210   BUN 23 12/17/2018 1006   CREATININE 0.74 01/14/2024 1210   CALCIUM  9.2 01/14/2024 1210   PROT 4.9 (L) 06/02/2022 0335   ALBUMIN  2.0 (L) 06/02/2022 0335   AST 39 06/02/2022 0335   ALT 24 06/02/2022 0335   ALKPHOS 54 06/02/2022 0335   BILITOT 0.8 06/02/2022 0335   GFRNONAA >60 01/14/2024 1210   GFRAA 55 (L) 06/27/2019 0642   Lab Results  Component Value Date   CHOL 112 01/23/2019   HDL 38 (L) 01/23/2019   LDLCALC 44 01/23/2019   TRIG 149 01/23/2019   CHOLHDL 2.9 01/23/2019   Lab Results  Component Value Date   HGBA1C 8.8 (H) 02/09/2024   Lab Results  Component Value Date   VITAMINB12 745 04/19/2021   Lab Results  Component Value Date   TSH 2.375 05/26/2022    05/26/22 CT head [I reviewed images myself and agree with interpretation. -VRP]  1. No acute intracranial process. 2. Mild diffuse atrophy.   ASSESSMENT AND PLAN  84 y.o. year old female here with gradual progressive short-term memory loss, confusion, cognitive decline since ~2020.   Dx:  1. Moderate vascular dementia without behavioral disturbance, psychotic disturbance, mood disturbance, or anxiety (HCC)     PLAN:  MODERATE DEMENTIA (vascular) - ok to stop donepezil  (low dose; minimal benefit; will stop to reduce pill burden) - in future could consider memantine 10mg  at bedtime; increase to twice a day after 1-2  weeks if needed per PCP - continue apixaban  for afib, continue diabetes meds, atorvastatin  for stroke prevention - try to stay active physically and get some exercise (at least 15-30 minutes per day) - eat a nutritious diet with lean protein, plants / vegetables, whole grains; avoid ultra-processed foods - increase social activities, brain stimulation, games, puzzles, hobbies, crafts, arts, music; try new activities; keep it fun! - aim for at least 7-8 hours sleep per night (or more) - avoid smoking and alcohol - needs help with medications, finances; no driving - safety / supervision issues reviewed - caregiver resources provided (including [email or web address])  Return for pending if symptoms worsen or fail to improve, return to PCP.    EDUARD FABIENE HANLON, MD 04/01/2024, 3:31 PM Certified in Neurology, Neurophysiology and Neuroimaging  [facility] [address] [place], [place] [address] [phone]     [1]  Allergies Allergen Reactions   Buspirone  Hcl     lethargic/tired   Dapagliflozin      Other Reaction(s): yeast infections   Metformin  And Related Diarrhea   Ozempic (0.25 Or 0.5 Mg-Dose) [Semaglutide(0.25 Or 0.5mg -Dos)]     Other reaction(s): nausea   Ultram  [Tramadol ] Nausea And Vomiting   Nickel Rash    Pt unable to wear jewelry made of nickel.   Pneumococcal Vaccines Swelling and Rash    At injection set   "

## 2024-04-01 NOTE — Patient Instructions (Addendum)
" °  MODERATE DEMENTIA (vascular) - ok to stop donepezil  (low dose; minimal benefit; will stop to reduce pill burden) - in future could consider memantine 10mg  at bedtime; increase to twice a day after 1-2 weeks if needed per PCP - continue apixaban  for afib, continue diabetes meds, atorvastatin  for stroke prevention - try to stay active physically and get some exercise (at least 15-30 minutes per day) - eat a nutritious diet with lean protein, plants / vegetables, whole grains; avoid ultra-processed foods - increase social activities, brain stimulation, games, puzzles, hobbies, crafts, arts, music; try new activities; keep it fun! - aim for at least 7-8 hours sleep per night (or more) - avoid smoking and alcohol - needs help with medications, finances; no driving - safety / supervision issues reviewed - caregiver resources provided (including [email or web address]) "

## 2024-04-23 ENCOUNTER — Ambulatory Visit (HOSPITAL_COMMUNITY)

## 2024-05-08 ENCOUNTER — Ambulatory Visit: Admitting: Cardiovascular Disease

## 2024-05-22 ENCOUNTER — Ambulatory Visit (HOSPITAL_COMMUNITY)
